# Patient Record
Sex: Male | Born: 1950 | Race: Black or African American | Hispanic: No | Marital: Married | Smoking: Current every day smoker
Health system: Southern US, Community
[De-identification: ages and names within clinical notes are randomized; demographics above are authoritative.]

## PROBLEM LIST (undated history)

## (undated) DIAGNOSIS — I5042 Chronic combined systolic (congestive) and diastolic (congestive) heart failure: Secondary | ICD-10-CM

## (undated) DIAGNOSIS — N183 Chronic kidney disease, stage 3 unspecified: Secondary | ICD-10-CM

## (undated) DIAGNOSIS — I447 Left bundle-branch block, unspecified: Secondary | ICD-10-CM

## (undated) DIAGNOSIS — I255 Ischemic cardiomyopathy: Secondary | ICD-10-CM

## (undated) DIAGNOSIS — I2510 Atherosclerotic heart disease of native coronary artery without angina pectoris: Secondary | ICD-10-CM

## (undated) DIAGNOSIS — E119 Type 2 diabetes mellitus without complications: Secondary | ICD-10-CM

## (undated) DIAGNOSIS — I10 Essential (primary) hypertension: Secondary | ICD-10-CM

## (undated) DIAGNOSIS — K5792 Diverticulitis of intestine, part unspecified, without perforation or abscess without bleeding: Secondary | ICD-10-CM

## (undated) DIAGNOSIS — Z720 Tobacco use: Secondary | ICD-10-CM

## (undated) DIAGNOSIS — I739 Peripheral vascular disease, unspecified: Secondary | ICD-10-CM

## (undated) DIAGNOSIS — J449 Chronic obstructive pulmonary disease, unspecified: Secondary | ICD-10-CM

## (undated) HISTORY — PX: NECK SURGERY: SHX720

---

## 2003-05-31 ENCOUNTER — Other Ambulatory Visit: Payer: Self-pay

## 2005-04-27 ENCOUNTER — Other Ambulatory Visit: Payer: Self-pay

## 2005-04-27 ENCOUNTER — Ambulatory Visit: Payer: Self-pay | Admitting: Unknown Physician Specialty

## 2005-05-02 ENCOUNTER — Emergency Department: Payer: Self-pay | Admitting: Emergency Medicine

## 2005-12-12 ENCOUNTER — Emergency Department: Payer: Self-pay | Admitting: Emergency Medicine

## 2006-05-30 ENCOUNTER — Emergency Department: Payer: Self-pay

## 2006-05-30 ENCOUNTER — Ambulatory Visit: Payer: Self-pay | Admitting: Psychiatry

## 2006-05-30 ENCOUNTER — Inpatient Hospital Stay (HOSPITAL_COMMUNITY): Admission: AD | Admit: 2006-05-30 | Discharge: 2006-06-06 | Payer: Self-pay | Admitting: Psychiatry

## 2008-04-12 ENCOUNTER — Inpatient Hospital Stay: Payer: Self-pay | Admitting: *Deleted

## 2008-08-10 ENCOUNTER — Ambulatory Visit: Payer: Self-pay | Admitting: Gastroenterology

## 2011-01-16 ENCOUNTER — Emergency Department: Payer: Self-pay | Admitting: Unknown Physician Specialty

## 2012-09-03 ENCOUNTER — Emergency Department: Payer: Self-pay | Admitting: Emergency Medicine

## 2012-09-03 LAB — COMPREHENSIVE METABOLIC PANEL
Albumin: 4 g/dL (ref 3.4–5.0)
Anion Gap: 10 (ref 7–16)
BUN: 22 mg/dL — ABNORMAL HIGH (ref 7–18)
Calcium, Total: 9.5 mg/dL (ref 8.5–10.1)
Co2: 20 mmol/L — ABNORMAL LOW (ref 21–32)
EGFR (African American): 49 — ABNORMAL LOW
EGFR (Non-African Amer.): 42 — ABNORMAL LOW
Glucose: 680 mg/dL (ref 65–99)
Potassium: 4.4 mmol/L (ref 3.5–5.1)
SGOT(AST): 15 U/L (ref 15–37)
SGPT (ALT): 29 U/L (ref 12–78)
Sodium: 129 mmol/L — ABNORMAL LOW (ref 136–145)
Total Protein: 7.8 g/dL (ref 6.4–8.2)

## 2012-09-03 LAB — URINALYSIS, COMPLETE
Glucose,UR: >=500 mg/dL (ref 0–75)
Ketone: NEGATIVE
Leukocyte Esterase: NEGATIVE
Nitrite: NEGATIVE
Protein: NEGATIVE
RBC,UR: <1 /HPF (ref 0–5)
Specific Gravity: 1.03 (ref 1.003–1.030)
WBC UR: <1 /HPF (ref 0–5)

## 2012-09-03 LAB — CBC
HGB: 14.1 g/dL (ref 13.0–18.0)
MCH: 32.5 pg (ref 26.0–34.0)
MCV: 96 fL (ref 80–100)

## 2012-09-03 LAB — CK TOTAL AND CKMB (NOT AT ARMC): CK, Total: 166 U/L (ref 35–232)

## 2012-09-03 LAB — TROPONIN I: Troponin-I: <0.02 ng/mL

## 2014-06-07 ENCOUNTER — Inpatient Hospital Stay: Payer: Self-pay | Admitting: Internal Medicine

## 2014-07-02 ENCOUNTER — Ambulatory Visit: Payer: Self-pay | Admitting: Family Medicine

## 2014-07-02 ENCOUNTER — Telehealth: Payer: Self-pay

## 2014-07-02 NOTE — Telephone Encounter (Signed)
Patient thinks he has an appt. Here.  No appt. Scheduled will call pcp to investigate.

## 2014-07-26 ENCOUNTER — Ambulatory Visit
Admit: 2014-07-26 | Disposition: A | Discharge status: Home / Self Care | Payer: Self-pay | Attending: Gastroenterology | Admitting: Gastroenterology

## 2014-08-08 NOTE — Consult Note (Signed)
Brief Consult Note: Diagnosis: uncontrolled htn, dm2.   Patient was seen by consultant.   Consult note dictated.   Orders entered.   Comments: 64yaam pmh dm2 insulin dep, p/w abd pain - contained perf diverticulitis  1. htn uncontrolled: infection, pain, regardless - prn hydralazine if remains elevated will need chronic meds, norvasc 5mg  2. dm2, insuilln requiring : given NPO status will decrease insulin to half doses, q6h accu, iss 3. hld: statin when tolerate po 4. complicated diverticuliits: cipro/flagyl, npo, defer further care to primary 5. vte px: hep subcutaneous full 1845min].  Electronic Signatures: Hower, Cletis Athensavid K (MD)  (Signed 29-Feb-16 02:20)  Authored: Brief Consult Note   Last Updated: 29-Feb-16 02:20 by Wyatt HasteHower, David K (MD)

## 2014-08-08 NOTE — Consult Note (Signed)
PATIENT NAME:  Damon [name], [name] MR#:  [id number] DATE OF BIRTH:  10-11-1950  DATE OF CONSULTATION:  06/07/2014  CONSULTING PHYSICIAN:  Cristal Deerhristopher A. Kamarion Zagami, MD  REASON FOR CONSULTATION: Suprapubic and rectal pain, leukocytosis, CT scan suggestive of acute diverticulitis with small peridiverticular abscess.   HISTORY OF PRESENT ILLNESS: Mr. Damon [name] is a pleasant 64 year old male with history of diabetes and hypercholesterolemia, who presents with 2 days of suprapubic pain that has worsened, also some rectal and anal pain. He had a similar episode approximately 1 week ago, which resolved on its own. No fevers, chills. No nausea, vomiting. No chest pain, shortness of breath. He has had mild diarrhea. No constipation. No headache, cough, dysuria, or hematuria.   PAST MEDICAL HISTORY:  1.  Diabetes mellitus.  2.  Hypercholesterolemia.  3.  History of back pain.  4.  History of neck surgery.   HOME MEDICATIONS:  1.  Novolin 70/30  b.i.d.  2.  Multivitamin.  3.  Aspirin 81 mg.   ALLERGIES: LIPITOR, ZOCOR, AND PRAVACHOL.   SOCIAL HISTORY: Everyday smoker. Denies significant alcohol. Denies drug use.   FAMILY HISTORY: Heart disease, cancer, hypertension.   REVIEW OF SYSTEMS: A 12-point review of systems was obtained. Pertinent positives and negatives as above.   PHYSICAL EXAMINATION:  VITAL SIGNS: Temperature 98.5, pulse 92, blood pressure 189/91, respirations 20, O2 saturation 99% on room air.  GENERAL: No acute distress. Alert and oriented x 3.  HEENT: Head: Normocephalic, atraumatic. Eyes: No scleral icterus. No conjunctivitis. Face: No obvious face trauma. Normal external nose. Normal external ears.  CHEST: Lungs clear to auscultation. Moving air well.  HEART: Regular rate and rhythm. No murmurs, rubs, or gallops.  ABDOMEN: Soft. Tender in the suprapubic region, nondistended.  EXTREMITIES: Moves all extremities well. Strength 5/5.  NEUROLOGIC: Cranial nerves II through XII grossly  intact.   LABORATORY DATA: Significant for white cell count of 14.1, hemoglobin 14.2, hematocrit 42.2, platelets are 255,000 with 79% neutrophil. Negative leukocyte esterase and negative nitrite on urinalysis. BMP is remarkable only for a blood sugar of 316.   CT scan shows diverticulitis with a small-contained perforation with small areas of gas outside. No pneumoperitoneum. Does have some submucosal edema in rectum thought to be reactive proctitis.   ASSESSMENT AND PLAN: Mr. Damon [name] is a pleasant 64 year old male who presents with acute onset of suprapubic and rectal pain and anal pain, mild leukocytosis plus hyperglycemia. CT scan suggestive of a contained perforated sigmoid diverticulitis. We will admit the patient, n.p.o., intravenous fluids, intravenous antibiotics, serial abdominal examinations. No urgent surgical indication at this time. The patient will need a colonoscopy in 6 weeks after he recovers from symptoms to exclude neoplasm. Discussed this with the patient and his wife and they agree with this plan.    ____________________________ Si Raiderhristopher A. Kenijah Benningfield, MD cal:ts D: 06/07/2014 01:21:00 ET T: 06/07/2014 01:39:39 ET JOB#: [id number]  cc: Cristal Deerhristopher A. Dejane Scheibe, MD, <Dictator> Jarvis NewcomerHRISTOPHER A Montoya Brandel MD ELECTRONICALLY SIGNED 06/15/2014 11:26

## 2014-08-08 NOTE — Consult Note (Signed)
PATIENT NAME:  Damon [name], Damon [name] MR#:  [id number] DATE OF BIRTH:  03/01/1951  DATE OF CONSULTATION:  06/07/2014  REFERRING PHYSICIAN:  Cristal Deer A. Lundquist, MD CONSULTING PHYSICIAN:  Cletis Athens. Travoris Bushey, MD   REASON FOR CONSULTATION: Uncontrolled hypertension.   HISTORY OF PRESENT ILLNESS: This is a 64 year old African American gentleman with past medical history of type 2 diabetes, insulin-dependent, as well as hyperlipidemia, unspecified; who was admitted to the hospital on 06/07/2014 by general surgery. He originally presented with acute onset of abdominal pain, sharp, suprapubic in location, nonradiating, no worsening or relieving factors intensity 8 to 9 out of 10. Currently after some doses of pain medication, he has relief. However during his hospital course thus far, found to have diverticulitis complicated by contained perforation. Subsequently, he was admitted by general surgery for the above issue. However, he is noted to be hypertensive with blood pressures at maximum of 197/84. This, we are asked for medical consult.  Currently the patient has no further complaints at this time and states his abdominal pain has improved. Intensity now is 2 to 3 out of 10.  REVIEW OF SYSTEMS:  CONSTITUTIONAL: Denies fevers, chills. Positive for fatigue, weakness.  EYES: Denies blurry vision, double vision, eye pain.  EARS, NOSE, THROAT: Denies tinnitus, ear pain, hearing loss.  RESPIRATORY: Denies cough, wheeze, or shortness of breath.  CARDIOVASCULAR: Denies chest pain, palpitations, edema.  GASTROINTESTINAL: Denies nausea, vomiting, and diarrhea. Positive for abdominal pain. GENITOURINARY: Denies dysuria or hematuria.  ENDOCRINE: Denies nocturia or thyroid problems. HEMATOLOGIC AND LYMPHATIC: Denies easy bruising or bleeding.  SKIN: Denies rashes or lesions.  MUSCULOSKELETAL: Denies pain in the neck, shoulders, knees, hips.  NEUROLOGIC: Denies paralysis, paresthesias.  PSYCHIATRIC: Denies anxiety or  depressive symptoms. Otherwise, a full review of systems performed by me is negative.   PAST MEDICAL HISTORY: Includes hyperlipidemia, unspecified; type 2 diabetes insulin-requiring, without complication.   SOCIAL HISTORY: Positive for everyday tobacco use. Occasional alcohol use. Denies any drug use.   FAMILY HISTORY: Positive for diabetes.   ALLERGIES: LIPITOR, PRAVACHOL, ZOCOR.   HOME MEDICATIONS: Include aspirin 81 mg p.o. daily, Novolin 70/30 fifteen units in the morning and 10 units in the evening, and multivitamin daily.   PHYSICAL EXAMINATION:  VITAL SIGNS: Temperature 98.5, heart rate 91, respirations 18. Blood pressure maximum 197/84, currently 131/84. Saturating 99% on room air. Weight 81.6 kg, BMI 25.8.  GENERAL: Well-developed, well-nourished African American gentleman, currently in no acute distress.  HEAD: Normocephalic, atraumatic.  EYES: Pupils equal, round, reactive to light. Extraocular muscles intact. No scleral icterus.  MOUTH: Moist mucosal membrane. Dentition poor. No abscess noted.  EARS, NOSE, AND THROAT:  Clear without exudates. No external lesions.  NECK: Supple. No thyromegaly. No nodules. No JVD.  PULMONARY: Clear to auscultation bilaterally without wheezes, rubs, or rhonchi. No use of accessory muscles. Good respiratory effort.  CHEST: Nontender to palpation.  CARDIOVASCULAR: S1, S2, regular rate and rhythm. No murmurs, rubs, or gallops. No edema. Pedal pulses 2+ bilaterally.  GASTROINTESTINAL:  Soft. Minimal tenderness in the suprapubic region without rebound or guarding. No motion tenderness. Positive bowel sounds. No hepatosplenomegaly.  MUSCULOSKELETAL: No swelling, clubbing, or edema. Range of motion full in all extremities.  NEUROLOGIC: Cranial nerves II through XII intact. No gross focal neurologic deficits. Sensation intact. Reflexes intact.  SKIN: No ulcerations, lesions, rash, cyanosis. Skin warm, dry. Turgor intact.  PSYCHIATRIC: Mood and affect  within normal limits. The patient is awake, alert, and oriented x3. Insight and judgment intact.  LABORATORY DATA: Sodium 139, potassium 3.9, chloride 107, bicarbonate 22, BUN 12, creatinine 1.16, glucose 316. LFTs: Albumin 3.2, alkaline phosphatase 117, AST 10, ALT 13 troponin less than 0.02. WBC 14.1, hemoglobin 14.2, platelets of 255,000. Urinalysis negative for evidence of infection.   CT of the abdomen and pelvis performed which reveals sigmoid diverticulitis complicated by contained perforation.   ASSESSMENT AND PLAN: A 64 year old African American gentleman with history of type 2 diabetes insulin-dependent, presented with abdominal pain, found to have diverticulitis complicated by perforation.  1. Hypertension, uncontrolled, likely in relation to infection as well as pain. Regardless, we will add p.r.n. hydralazine 10 mg IV q.4h. as needed for systolic greater than 180 or diastolic greater than 100. However, if it remains elevated will need chronic medications. When he is able to tolerate p.o. we will initiate something like Norvasc 5 mg p.o. daily. Once again, n.p.o. now. We will continue with current medications.  2. Type 2 diabetes insulin-requiring. Given his n.p.o. status, will decrease the dose to half dosing, which will be 7 units in the morning and 5 units in the evening of his 70/30. We will also initiate q.6 h. Accu-Cheks with insulin sliding scale coverage.  3. Complicated diverticulitis. Cipro, Flagyl for antibiotic coverage, n.p.o. status.  4. We will defer further care to primary team and general surgery.  5. DVT prophylaxis with heparin subcutaneous.  6. The patient is full code.   TIME SPENT: 45 minutes.    ____________________________ Cletis Athensavid K. Cyan Clippinger, MD dkh:ah D: 06/07/2014 02:28:00 ET T: 06/07/2014 06:44:17 ET JOB#: [id number]  cc: Cletis Athensavid K. Keshonna Valvo, MD, <Dictator> Latayvia Mandujano Synetta ShadowK Mafalda Mcginniss MD ELECTRONICALLY SIGNED 06/07/2014 21:05

## 2014-08-08 NOTE — Discharge Summary (Signed)
PATIENT NAME:  Damon [name], Damon [name] MR#:  [id number] DATE OF BIRTH:  04-30-1950  DATE OF ADMISSION:  06/07/2014 DATE OF DISCHARGE:  06/09/2014  DIAGNOSES: Diverticulitis, diabetes, and hypercholesterolemia.   PROCEDURES: None.   CONSULTANTS: None.   HISTORY OF PRESENT ILLNESS AND HOSPITAL COURSE: This patient was admitted to the hospital by Dr. Juliann PulseLundquist with a history of increasing left lower quadrant abdominal pain and a CT scan suggestive of acute diverticulitis. He was promptly placed on IV antibiotics and improved drastically and, in fact, his pain is virtually completely resolved at this point. He is tolerating a diet and is discharged in stable condition to continue oral antibiotics in the form of Cipro and Flagyl. He is restarted on his home medications and will follow up in our office in 10 days.    ____________________________ Adah Salvageichard E. Excell Seltzerooper, MD rec:mc D: 06/09/2014 11:04:23 ET T: 06/09/2014 14:26:14 ET JOB#: [id number]  cc: Adah Salvageichard E. Excell Seltzerooper, MD, <Dictator> Lattie HawICHARD E Kerrilynn Derenzo MD ELECTRONICALLY SIGNED 06/09/2014 14:51

## 2014-11-16 ENCOUNTER — Emergency Department
Admission: EM | Admit: 2014-11-16 | Discharge: 2014-11-16 | Disposition: A | Discharge status: Home / Self Care | Payer: Commercial Managed Care - PPO | Attending: Emergency Medicine | Admitting: Emergency Medicine

## 2014-11-16 ENCOUNTER — Encounter: Payer: Self-pay | Admitting: Emergency Medicine

## 2014-11-16 DIAGNOSIS — R739 Hyperglycemia, unspecified: Secondary | ICD-10-CM

## 2014-11-16 DIAGNOSIS — E1165 Type 2 diabetes mellitus with hyperglycemia: Secondary | ICD-10-CM | POA: Insufficient documentation

## 2014-11-16 DIAGNOSIS — Z720 Tobacco use: Secondary | ICD-10-CM | POA: Diagnosis not present

## 2014-11-16 DIAGNOSIS — I10 Essential (primary) hypertension: Secondary | ICD-10-CM | POA: Insufficient documentation

## 2014-11-16 DIAGNOSIS — R112 Nausea with vomiting, unspecified: Secondary | ICD-10-CM | POA: Diagnosis present

## 2014-11-16 HISTORY — DX: Diverticulitis of intestine, part unspecified, without perforation or abscess without bleeding: K57.92

## 2014-11-16 HISTORY — DX: Type 2 diabetes mellitus without complications: E11.9

## 2014-11-16 LAB — URINALYSIS COMPLETE WITH MICROSCOPIC (ARMC ONLY)
Bacteria, UA: NONE SEEN
Bilirubin Urine: NEGATIVE
Glucose, UA: >500 mg/dL — AB
Hgb urine dipstick: NEGATIVE
LEUKOCYTES UA: NEGATIVE
NITRITE: NEGATIVE
Protein, ur: NEGATIVE mg/dL
Specific Gravity, Urine: 1.026 (ref 1.005–1.030)
pH: 5 (ref 5.0–8.0)

## 2014-11-16 LAB — BASIC METABOLIC PANEL
Anion gap: 15 (ref 5–15)
BUN: 29 mg/dL — AB (ref 6–20)
CO2: 21 mmol/L — ABNORMAL LOW (ref 22–32)
Calcium: 10.3 mg/dL (ref 8.9–10.3)
Chloride: 101 mmol/L (ref 101–111)
Creatinine, Ser: 1.95 mg/dL — ABNORMAL HIGH (ref 0.61–1.24)
GFR, EST AFRICAN AMERICAN: 41 mL/min — AB (ref >60)
GFR, EST NON AFRICAN AMERICAN: 35 mL/min — AB (ref >60)
Glucose, Bld: 545 mg/dL (ref 65–99)
Potassium: 4.4 mmol/L (ref 3.5–5.1)
Sodium: 137 mmol/L (ref 135–145)

## 2014-11-16 LAB — CBC WITH DIFFERENTIAL/PLATELET
BASOS ABS: 0.1 10*3/uL (ref 0–0.1)
BASOS PCT: 1 %
Eosinophils Absolute: 0.1 10*3/uL (ref 0–0.7)
Eosinophils Relative: 1 %
HCT: 50.1 % (ref 40.0–52.0)
Hemoglobin: 16.5 g/dL (ref 13.0–18.0)
LYMPHS ABS: 2.8 10*3/uL (ref 1.0–3.6)
LYMPHS PCT: 19 %
MCH: 31 pg (ref 26.0–34.0)
MCHC: 32.9 g/dL (ref 32.0–36.0)
MCV: 94.3 fL (ref 80.0–100.0)
MONOS PCT: 6 %
Monocytes Absolute: 0.9 10*3/uL (ref 0.2–1.0)
Neutro Abs: 10.6 10*3/uL — ABNORMAL HIGH (ref 1.4–6.5)
Neutrophils Relative %: 73 %
PLATELETS: 291 10*3/uL (ref 150–440)
RBC: 5.31 MIL/uL (ref 4.40–5.90)
RDW: 13.8 % (ref 11.5–14.5)
WBC: 14.5 10*3/uL — ABNORMAL HIGH (ref 3.8–10.6)

## 2014-11-16 LAB — GLUCOSE, CAPILLARY
GLUCOSE-CAPILLARY: 505 mg/dL — AB (ref 65–99)
GLUCOSE-CAPILLARY: 433 mg/dL — AB (ref 65–99)
GLUCOSE-CAPILLARY: 546 mg/dL — AB (ref 65–99)
Glucose-Capillary: 298 mg/dL — ABNORMAL HIGH (ref 65–99)

## 2014-11-16 LAB — BLOOD GAS, VENOUS
Acid-base deficit: 3.1 mmol/L — ABNORMAL HIGH (ref 0.0–2.0)
Bicarbonate: 22 mEq/L (ref 21.0–28.0)
FIO2: 0.21
PATIENT TEMPERATURE: 37
pCO2, Ven: 39 mmHg — ABNORMAL LOW (ref 44.0–60.0)
pH, Ven: 7.36 (ref 7.320–7.430)

## 2014-11-16 MED ORDER — SODIUM CHLORIDE 0.9 % IV BOLUS (SEPSIS)
1000.0000 mL | Freq: Once | INTRAVENOUS | Status: AC
  Administered 2014-11-16: 1000 mL via INTRAVENOUS

## 2014-11-16 MED ORDER — INSULIN ASPART 100 UNIT/ML ~~LOC~~ SOLN
6.0000 [IU] | Freq: Once | SUBCUTANEOUS | Status: AC
  Administered 2014-11-16: 6 [IU] via INTRAVENOUS
  Filled 2014-11-16: qty 6

## 2014-11-16 NOTE — ED Notes (Signed)
Given water; okay per EDP.  Aware of need for urine.

## 2014-11-16 NOTE — ED Notes (Signed)
MD at bedside. 

## 2014-11-16 NOTE — ED Provider Notes (Signed)
[facility] Emergency Department Provider Note  ____________________________________________  Time seen: 0730  I have reviewed the triage vital signs and the nursing notes.   HISTORY  Chief Complaint Nausea and Emesis   History limited by: Not Limited   HPI Damon [name] is a 64 y.o. male who presents to the emergency department because of vomiting and nausea that started 3 days ago. Patient states that he has not been able to hold down any solid by mouth since then. His nausea and vomiting is accompanied by some abdominal discomfort. He does state that his insulin regimen was changed within the last week. He intermittently checks his blood sugars at home. He denies any fevers, chest pain, shortness of breath. Denies any diarrhea.   Past Medical History  Diagnosis Date  . Diabetes mellitus without complication   . Hypertension     There are no active problems to display for this patient.   History reviewed. No pertinent past surgical history.  No current outpatient prescriptions on file.  Allergies Review of patient's allergies indicates no known allergies.  No family history on file.  Social History History  Substance Use Topics  . Smoking status: Current Every Day Smoker  . Smokeless tobacco: Not on file  . Alcohol Use: No    Review of Systems  Constitutional: Negative for fever. Cardiovascular: Negative for chest pain. Respiratory: Negative for shortness of breath. Gastrointestinal: Positive for nausea and vomiting Genitourinary: Negative for dysuria. Musculoskeletal: Negative for back pain. Skin: Negative for rash. Neurological: Negative for headaches, focal weakness or numbness.  10-point ROS otherwise negative.  ____________________________________________   PHYSICAL EXAM:  VITAL SIGNS: ED Triage Vitals  Enc Vitals Group     BP 11/16/14 0719 135/80 mmHg     Pulse Rate 11/16/14 0719 122     Resp 11/16/14 0719 20      Temp 11/16/14 0719 98.5 F (36.9 C)     Temp Source 11/16/14 0719 Oral     SpO2 11/16/14 0719 99 %     Weight 11/16/14 0719 170 lb (77.111 kg)     Height 11/16/14 0719 5\' 10"  (1.778 m)     Head Cir --      Peak Flow --      Pain Score 11/16/14 0720 0   Constitutional: Alert and oriented. Well appearing and in no distress. Eyes: Conjunctivae are normal. PERRL. Normal extraocular movements. ENT   Head: Normocephalic and atraumatic.   Nose: No congestion/rhinnorhea.   Mouth/Throat: Mucous membranes are moist.   Neck: No stridor. Hematological/Lymphatic/Immunilogical: No cervical lymphadenopathy. Cardiovascular: Normal rate, regular rhythm.  No murmurs, rubs, or gallops. Respiratory: Normal respiratory effort without tachypnea nor retractions. Breath sounds are clear and equal bilaterally. No wheezes/rales/rhonchi. Gastrointestinal: Soft and nontender. No distention. There is no CVA tenderness. Genitourinary: Deferred Musculoskeletal: Normal range of motion in all extremities. No joint effusions.  No lower extremity tenderness nor edema. Neurologic:  Normal speech and language. No gross focal neurologic deficits are appreciated. Speech is normal.  Skin:  Skin is warm, dry and intact. No rash noted. Psychiatric: Mood and affect are normal. Speech and behavior are normal. Patient exhibits appropriate insight and judgment.  ____________________________________________    LABS (pertinent positives/negatives)  Labs Reviewed  GLUCOSE, CAPILLARY - Abnormal; Notable for the following:    Glucose-Capillary 505 (*)    All other components within normal limits  URINALYSIS COMPLETEWITH MICROSCOPIC ([facility] ONLY) - Abnormal; Notable for the following:    Color, Urine YELLOW (*)  APPearance CLEAR (*)    Glucose, UA >500 (*)    Ketones, ur 1+ (*)    Squamous Epithelial / LPF 0-5 (*)    All other components within normal limits  CBC WITH DIFFERENTIAL/PLATELET - Abnormal; Notable  for the following:    WBC 14.5 (*)    Neutro Abs 10.6 (*)    All other components within normal limits  BLOOD GAS, VENOUS - Abnormal; Notable for the following:    pCO2, Ven 39 (*)    Acid-base deficit 3.1 (*)    All other components within normal limits  BASIC METABOLIC PANEL - Abnormal; Notable for the following:    CO2 21 (*)    Glucose, Bld 545 (*)    BUN 29 (*)    Creatinine, Ser 1.95 (*)    GFR calc non Af Amer 35 (*)    GFR calc Af Amer 41 (*)    All other components within normal limits  GLUCOSE, CAPILLARY - Abnormal; Notable for the following:    Glucose-Capillary 546 (*)    All other components within normal limits  GLUCOSE, CAPILLARY - Abnormal; Notable for the following:    Glucose-Capillary 433 (*)    All other components within normal limits  GLUCOSE, CAPILLARY - Abnormal; Notable for the following:    Glucose-Capillary 298 (*)    All other components within normal limits      ____________________________________________   EKG  None  ____________________________________________    RADIOLOGY  None  ____________________________________________   PROCEDURES  Procedure(s) performed: None  Critical Care performed: No  ____________________________________________   INITIAL IMPRESSION / ASSESSMENT AND PLAN / ED COURSE  Pertinent labs & imaging results that were available during my care of the patient were reviewed by me and considered in my medical decision making (see chart for details).  Patient presented to the emergency department today because of concerns for nausea and vomiting. Patient's sugars were found be quite elevated here. No signs of DKA on the other blood work. Patient's sugars did come down appropriately after fluids and insulin. Discussed with patient's importance of following with his primary care doctor for further diabetic control.  ____________________________________________   FINAL CLINICAL IMPRESSION(S) / ED  DIAGNOSES  Final diagnoses:  Hyperglycemia     Phineas Semen, MD 11/16/14 1251

## 2014-11-16 NOTE — ED Notes (Addendum)
Pt to ed with c/o vomiting and nausea since Saturday.  Pt states last week his PMD changed his insulin to lantus and since he has had nausea and vomiting.

## 2014-11-16 NOTE — ED Notes (Signed)
Patient has had vomiting since Saturday.  Reports they recently took him off 70/30 and placed him on lantus daily because his A1C was too high.  Only checks sugars once in a while per his report.

## 2014-11-16 NOTE — Discharge Instructions (Signed)
It is very important that you discuss your diabetes control with her primary care physician.Please seek medical attention for any high fevers, chest pain, shortness of breath, change in behavior, persistent vomiting, bloody stool or any other new or concerning symptoms.  Hyperglycemia Hyperglycemia occurs when the glucose (sugar) in your blood is too high. Hyperglycemia can happen for many reasons, but it most often happens to people who do not know they have diabetes or are not managing their diabetes properly.  CAUSES  Whether you have diabetes or not, there are other causes of hyperglycemia. Hyperglycemia can occur when you have diabetes, but it can also occur in other situations that you might not be as aware of, such as: Diabetes  If you have diabetes and are having problems controlling your blood glucose, hyperglycemia could occur because of some of the following reasons:  Not following your meal plan.  Not taking your diabetes medications or not taking it properly.  Exercising less or doing less activity than you normally do.  Being sick. Pre-diabetes  This cannot be ignored. Before people develop Type 2 diabetes, they almost always have "pre-diabetes." This is when your blood glucose levels are higher than normal, but not yet high enough to be diagnosed as diabetes. Research has shown that some long-term damage to the body, especially the heart and circulatory system, may already be occurring during pre-diabetes. If you take action to manage your blood glucose when you have pre-diabetes, you may delay or prevent Type 2 diabetes from developing. Stress  If you have diabetes, you may be "diet" controlled or on oral medications or insulin to control your diabetes. However, you may find that your blood glucose is higher than usual in the hospital whether you have diabetes or not. This is often referred to as "stress hyperglycemia." Stress can elevate your blood glucose. This happens because  of hormones put out by the body during times of stress. If stress has been the cause of your high blood glucose, it can be followed regularly by your caregiver. That way he/she can make sure your hyperglycemia does not continue to get worse or progress to diabetes. Steroids  Steroids are medications that act on the infection fighting system (immune system) to block inflammation or infection. One side effect can be a rise in blood glucose. Most people can produce enough extra insulin to allow for this rise, but for those who cannot, steroids make blood glucose levels go even higher. It is not unusual for steroid treatments to "uncover" diabetes that is developing. It is not always possible to determine if the hyperglycemia will go away after the steroids are stopped. A special blood test called an A1c is sometimes done to determine if your blood glucose was elevated before the steroids were started. SYMPTOMS  Thirsty.  Frequent urination.  Dry mouth.  Blurred vision.  Tired or fatigue.  Weakness.  Sleepy.  Tingling in feet or leg. DIAGNOSIS  Diagnosis is made by monitoring blood glucose in one or all of the following ways:  A1c test. This is a chemical found in your blood.  Fingerstick blood glucose monitoring.  Laboratory results. TREATMENT  First, knowing the cause of the hyperglycemia is important before the hyperglycemia can be treated. Treatment may include, but is not be limited to:  Education.  Change or adjustment in medications.  Change or adjustment in meal plan.  Treatment for an illness, infection, etc.  More frequent blood glucose monitoring.  Change in exercise plan.  Decreasing or  stopping steroids.  Lifestyle changes. HOME CARE INSTRUCTIONS   Test your blood glucose as directed.  Exercise regularly. Your caregiver will give you instructions about exercise. Pre-diabetes or diabetes which comes on with stress is helped by exercising.  Eat wholesome,  balanced meals. Eat often and at regular, fixed times. Your caregiver or nutritionist will give you a meal plan to guide your sugar intake.  Being at an ideal weight is important. If needed, losing as little as 10 to 15 pounds may help improve blood glucose levels. SEEK MEDICAL CARE IF:   You have questions about medicine, activity, or diet.  You continue to have symptoms (problems such as increased thirst, urination, or weight gain). SEEK IMMEDIATE MEDICAL CARE IF:   You are vomiting or have diarrhea.  Your breath smells fruity.  You are breathing faster or slower.  You are very sleepy or incoherent.  You have numbness, tingling, or pain in your feet or hands.  You have chest pain.  Your symptoms get worse even though you have been following your caregiver's orders.  If you have any other questions or concerns. Document Released: 09/19/2000 Document Revised: 06/18/2011 Document Reviewed: 07/23/2011 [facility] Patient Information 2015 [place], [place]. This information is not intended to replace advice given to you by your health care provider. Make sure you discuss any questions you have with your health care provider.

## 2015-03-24 ENCOUNTER — Emergency Department
Admission: EM | Admit: 2015-03-24 | Discharge: 2015-03-24 | Disposition: A | Discharge status: Home / Self Care | Payer: Commercial Managed Care - PPO | Attending: Emergency Medicine | Admitting: Emergency Medicine

## 2015-03-24 ENCOUNTER — Emergency Department: Payer: Commercial Managed Care - PPO

## 2015-03-24 DIAGNOSIS — Z79899 Other long term (current) drug therapy: Secondary | ICD-10-CM | POA: Diagnosis not present

## 2015-03-24 DIAGNOSIS — J069 Acute upper respiratory infection, unspecified: Secondary | ICD-10-CM | POA: Diagnosis not present

## 2015-03-24 DIAGNOSIS — E119 Type 2 diabetes mellitus without complications: Secondary | ICD-10-CM | POA: Diagnosis not present

## 2015-03-24 DIAGNOSIS — F17200 Nicotine dependence, unspecified, uncomplicated: Secondary | ICD-10-CM | POA: Insufficient documentation

## 2015-03-24 DIAGNOSIS — Z794 Long term (current) use of insulin: Secondary | ICD-10-CM | POA: Insufficient documentation

## 2015-03-24 DIAGNOSIS — R0602 Shortness of breath: Secondary | ICD-10-CM | POA: Diagnosis present

## 2015-03-24 DIAGNOSIS — R030 Elevated blood-pressure reading, without diagnosis of hypertension: Secondary | ICD-10-CM | POA: Diagnosis not present

## 2015-03-24 DIAGNOSIS — I10 Essential (primary) hypertension: Secondary | ICD-10-CM | POA: Diagnosis not present

## 2015-03-24 MED ORDER — HYDROCOD POLST-CPM POLST ER 10-8 MG/5ML PO SUER
5.0000 mL | Freq: Once | ORAL | Status: AC
  Administered 2015-03-24: 5 mL via ORAL
  Filled 2015-03-24: qty 5

## 2015-03-24 MED ORDER — PSEUDOEPH-BROMPHEN-DM 30-2-10 MG/5ML PO SYRP: 5.0000 mL | ORAL_SOLUTION | Freq: Four times a day (QID) | ORAL | Status: DC | PRN

## 2015-03-24 NOTE — ED Notes (Signed)
Pt c/o chest congestion that is making him feel short of breath.

## 2015-03-24 NOTE — ED Notes (Signed)
Pt in with shob since Monday, does have a cough and congestion.  No shob noted at this time no co pain.

## 2015-03-24 NOTE — Discharge Instructions (Signed)
Advised to consider tobacco cessation.  Upper Respiratory Infection, Adult Most upper respiratory infections (URIs) are a viral infection of the air passages leading to the lungs. A URI affects the nose, throat, and upper air passages. The most common type of URI is nasopharyngitis and is typically referred to as "the common cold." URIs run their course and usually go away on their own. Most of the time, a URI does not require medical attention, but sometimes a bacterial infection in the upper airways can follow a viral infection. This is called a secondary infection. Sinus and middle ear infections are common types of secondary upper respiratory infections. Bacterial pneumonia can also complicate a URI. A URI can worsen asthma and chronic obstructive pulmonary disease (COPD). Sometimes, these complications can require emergency medical care and may be life threatening.  CAUSES Almost all URIs are caused by viruses. A virus is a type of germ and can spread from one person to another.  RISKS FACTORS You may be at risk for a URI if:   You smoke.   You have chronic heart or lung disease.  You have a weakened defense (immune) system.   You are very young or very old.   You have nasal allergies or asthma.  You work in crowded or poorly ventilated areas.  You work in health care facilities or schools. SIGNS AND SYMPTOMS  Symptoms typically develop 2-3 days after you come in contact with a cold virus. Most viral URIs last 7-10 days. However, viral URIs from the influenza virus (flu virus) can last 14-18 days and are typically more severe. Symptoms may include:   Runny or stuffy (congested) nose.   Sneezing.   Cough.   Sore throat.   Headache.   Fatigue.   Fever.   Loss of appetite.   Pain in your forehead, behind your eyes, and over your cheekbones (sinus pain).  Muscle aches.  DIAGNOSIS  Your health care provider may diagnose a URI by:  Physical exam.  Tests to  check that your symptoms are not due to another condition such as:  Strep throat.  Sinusitis.  Pneumonia.  Asthma. TREATMENT  A URI goes away on its own with time. It cannot be cured with medicines, but medicines may be prescribed or recommended to relieve symptoms. Medicines may help:  Reduce your fever.  Reduce your cough.  Relieve nasal congestion. HOME CARE INSTRUCTIONS   Take medicines only as directed by your health care provider.   Gargle warm saltwater or take cough drops to comfort your throat as directed by your health care provider.  Use a warm mist humidifier or inhale steam from a shower to increase air moisture. This may make it easier to breathe.  Drink enough fluid to keep your urine clear or pale yellow.   Eat soups and other clear broths and maintain good nutrition.   Rest as needed.   Return to work when your temperature has returned to normal or as your health care provider advises. You may need to stay home longer to avoid infecting others. You can also use a face mask and careful hand washing to prevent spread of the virus.  Increase the usage of your inhaler if you have asthma.   Do not use any tobacco products, including cigarettes, chewing tobacco, or electronic cigarettes. If you need help quitting, ask your health care provider. PREVENTION  The best way to protect yourself from getting a cold is to practice good hygiene.   Avoid oral or  hand contact with people with cold symptoms.   Wash your hands often if contact occurs.  There is no clear evidence that vitamin C, vitamin E, echinacea, or exercise reduces the chance of developing a cold. However, it is always recommended to get plenty of rest, exercise, and practice good nutrition.  SEEK MEDICAL CARE IF:   You are getting worse rather than better.   Your symptoms are not controlled by medicine.   You have chills.  You have worsening shortness of breath.  You have brown or red  mucus.  You have yellow or brown nasal discharge.  You have pain in your face, especially when you bend forward.  You have a fever.  You have swollen neck glands.  You have pain while swallowing.  You have white areas in the back of your throat. SEEK IMMEDIATE MEDICAL CARE IF:   You have severe or persistent:  Headache.  Ear pain.  Sinus pain.  Chest pain.  You have chronic lung disease and any of the following:  Wheezing.  Prolonged cough.  Coughing up blood.  A change in your usual mucus.  You have a stiff neck.  You have changes in your:  Vision.  Hearing.  Thinking.  Mood. MAKE SURE YOU:   Understand these instructions.  Will watch your condition.  Will get help right away if you are not doing well or get worse.   This information is not intended to replace advice given to you by your health care provider. Make sure you discuss any questions you have with your health care provider.   Document Released: 09/19/2000 Document Revised: 08/10/2014 Document Reviewed: 07/01/2013 Elsevier Interactive Patient Education [employer]

## 2015-03-24 NOTE — ED Provider Notes (Signed)
[facility] Emergency Department Provider Note  ____________________________________________  Time seen: Approximately 10:36 PM  I have reviewed the triage vital signs and the nursing notes.   HISTORY  Chief Complaint Shortness of Breath    HPI Damon [name] is a 64 y.o. male patient complain of dyspnea with cough congestion for 1 week. Patient denies any fever or chills with this complaint. He denies any nausea vomiting diarrhea. He stated cough increase with laying down. Patient stated cough refractory to over-the-counter cough medications. Patient denies any chest pain. Patient has a positive tobacco use.    Past Medical History  Diagnosis Date  . Diabetes mellitus without complication   . Hypertension   . Diverticulitis     There are no active problems to display for this patient.   Past Surgical History  Procedure Laterality Date  . Neck surgery      Current Outpatient Rx  Name  Route  Sig  Dispense  Refill  . amLODipine (NORVASC) 10 MG tablet   Oral   Take 10 mg by mouth daily.         . enalapril (VASOTEC) 20 MG tablet   Oral   Take 40 mg by mouth daily.         Marland Kitchen. gabapentin (NEURONTIN) 300 MG capsule   Oral   Take 300 mg by mouth 2 (two) times daily.         . hydrochlorothiazide (HYDRODIURIL) 25 MG tablet   Oral   Take 25 mg by mouth daily.         . Insulin Glargine (LANTUS SOLOSTAR) 100 UNIT/ML Solostar Pen   Subcutaneous   Inject 20 Units into the skin daily.         . metoprolol (LOPRESSOR) 50 MG tablet   Oral   Take 50 mg by mouth 2 (two) times daily.           Allergies Review of patient's allergies indicates no known allergies.  No family history on file.  Social History Social History  Substance Use Topics  . Smoking status: Current Every Day Smoker  . Smokeless tobacco: Not on file  . Alcohol Use: No    Review of Systems Constitutional: No fever/chills Eyes: No visual changes. ENT: No  sore throat. Cardiovascular: Denies chest pain. Respiratory: Denies shortness of breath. Gastrointestinal: No abdominal pain.  No nausea, no vomiting.  No diarrhea.  No constipation. Genitourinary: Negative for dysuria. Musculoskeletal: Negative for back pain. Skin: Negative for rash. Neurological: Negative for headaches, focal weakness or numbness. Endocrine:Hypertension and diabetes. 10-point ROS otherwise negative.  ____________________________________________   PHYSICAL EXAM:  VITAL SIGNS: ED Triage Vitals  Enc Vitals Group     BP 03/24/15 2203 176/84 mmHg     Pulse Rate 03/24/15 2203 87     Resp 03/24/15 2203 20     Temp 03/24/15 2203 98.4 F (36.9 C)     Temp Source 03/24/15 2203 Oral     SpO2 03/24/15 2203 100 %     Weight 03/24/15 2203 175 lb (79.379 kg)     Height 03/24/15 2203 5\' 10"  (1.778 m)     Head Cir --      Peak Flow --      Pain Score --      Pain Loc --      Pain Edu? --      Excl. in GC? --     Constitutional: Alert and oriented. Well appearing and in no acute  distress. Eyes: Conjunctivae are normal. PERRL. EOMI. Head: Atraumatic. Nose: No congestion/rhinnorhea. Mouth/Throat: Mucous membranes are moist.  Oropharynx non-erythematous. Neck: No stridor.  No cervical spine tenderness to palpation. Hematological/Lymphatic/Immunilogical: No cervical lymphadenopathy. Cardiovascular: Normal rate, regular rhythm. Grossly normal heart sounds.  Good peripheral circulation. Elevated blood pressure Respiratory: Normal respiratory effort.  No retractions. Lungs bilateral Rales. Gastrointestinal: Soft and nontender. No distention. No abdominal bruits. No CVA tenderness. Musculoskeletal: No lower extremity tenderness nor edema.  No joint effusions. Neurologic:  Normal speech and language. No gross focal neurologic deficits are appreciated. No gait instability. Skin:  Skin is warm, dry and intact. No rash noted. Psychiatric: Mood and affect are normal. Speech and  behavior are normal.  ____________________________________________   LABS (all labs ordered are listed, but only abnormal results are displayed)  Labs Reviewed - No data to display ____________________________________________  EKG   ____________________________________________  RADIOLOGY  No acute findings on chest x-ray. I, Joni Reining, personally viewed and evaluated these images (plain radiographs) as part of my medical decision making.   ____________________________________________   PROCEDURES  Procedure(s) performed: None  Critical Care performed: No  ____________________________________________   INITIAL IMPRESSION / ASSESSMENT AND PLAN / ED COURSE  Pertinent labs & imaging results that were available during my care of the patient were reviewed by me and considered in my medical decision making (see chart for details).  Upper respiratory infection. Discussed negative history final patient. This patient given discharge Instructions. Patient given prescription for Bromfed-DM. Discussed smoking sensation. Patient given a prescription for Bromfed-DM to take as directed. Patient advised follow-up with family doctor. ____________________________________________   FINAL CLINICAL IMPRESSION(S) / ED DIAGNOSES  Final diagnoses:  URI (upper respiratory infection)      Joni Reining, PA-C 03/24/15 4259  Minna Antis, MD 03/24/15 2351

## 2015-03-24 NOTE — ED Notes (Signed)
Pt in NAD.  Discharge instruction given to pt and wife.  Voiced understanding.  Teach back verified.  No questions or concerns at this time.  Items with pt upon discharge.  No items left in ED.  Pt ambulatory, but requested wheelchair due to distance and shortness of breath.

## 2015-10-19 ENCOUNTER — Other Ambulatory Visit: Payer: Self-pay | Admitting: Vascular Surgery

## 2015-10-31 ENCOUNTER — Other Ambulatory Visit
Admission: RE | Admit: 2015-10-31 | Discharge: 2015-10-31 | Disposition: A | Discharge status: Home / Self Care | Payer: Commercial Managed Care - PPO | Source: Ambulatory Visit | Attending: Vascular Surgery | Admitting: Vascular Surgery

## 2015-10-31 DIAGNOSIS — Z01812 Encounter for preprocedural laboratory examination: Secondary | ICD-10-CM | POA: Diagnosis not present

## 2015-10-31 LAB — CREATININE, SERUM
CREATININE: 1.43 mg/dL — AB (ref 0.61–1.24)
GFR calc Af Amer: 59 mL/min — ABNORMAL LOW (ref >60)
GFR calc non Af Amer: 51 mL/min — ABNORMAL LOW (ref >60)

## 2015-10-31 LAB — BUN: BUN: 18 mg/dL (ref 6–20)

## 2015-11-01 ENCOUNTER — Encounter: Payer: Self-pay | Admitting: *Deleted

## 2015-11-01 ENCOUNTER — Ambulatory Visit
Admission: RE | Admit: 2015-11-01 | Discharge: 2015-11-01 | Disposition: A | Discharge status: Home / Self Care | Payer: Commercial Managed Care - PPO | Source: Ambulatory Visit | Attending: Vascular Surgery | Admitting: Vascular Surgery

## 2015-11-01 ENCOUNTER — Encounter
Admission: RE | Disposition: A | Discharge status: Home / Self Care | Payer: Self-pay | Source: Ambulatory Visit | Attending: Vascular Surgery

## 2015-11-01 DIAGNOSIS — M7989 Other specified soft tissue disorders: Secondary | ICD-10-CM | POA: Diagnosis not present

## 2015-11-01 DIAGNOSIS — E7800 Pure hypercholesterolemia, unspecified: Secondary | ICD-10-CM | POA: Diagnosis not present

## 2015-11-01 DIAGNOSIS — E119 Type 2 diabetes mellitus without complications: Secondary | ICD-10-CM | POA: Insufficient documentation

## 2015-11-01 DIAGNOSIS — I999 Unspecified disorder of circulatory system: Secondary | ICD-10-CM | POA: Diagnosis not present

## 2015-11-01 DIAGNOSIS — Z833 Family history of diabetes mellitus: Secondary | ICD-10-CM | POA: Diagnosis not present

## 2015-11-01 DIAGNOSIS — I70213 Atherosclerosis of native arteries of extremities with intermittent claudication, bilateral legs: Secondary | ICD-10-CM | POA: Diagnosis not present

## 2015-11-01 DIAGNOSIS — I10 Essential (primary) hypertension: Secondary | ICD-10-CM | POA: Diagnosis not present

## 2015-11-01 DIAGNOSIS — F17200 Nicotine dependence, unspecified, uncomplicated: Secondary | ICD-10-CM | POA: Diagnosis not present

## 2015-11-01 DIAGNOSIS — M79609 Pain in unspecified limb: Secondary | ICD-10-CM | POA: Insufficient documentation

## 2015-11-01 DIAGNOSIS — Z8249 Family history of ischemic heart disease and other diseases of the circulatory system: Secondary | ICD-10-CM | POA: Insufficient documentation

## 2015-11-01 DIAGNOSIS — Z841 Family history of disorders of kidney and ureter: Secondary | ICD-10-CM | POA: Insufficient documentation

## 2015-11-01 DIAGNOSIS — Z823 Family history of stroke: Secondary | ICD-10-CM | POA: Insufficient documentation

## 2015-11-01 DIAGNOSIS — M6281 Muscle weakness (generalized): Secondary | ICD-10-CM | POA: Diagnosis not present

## 2015-11-01 DIAGNOSIS — Z794 Long term (current) use of insulin: Secondary | ICD-10-CM | POA: Diagnosis not present

## 2015-11-01 HISTORY — PX: PERIPHERAL VASCULAR CATHETERIZATION: SHX172C

## 2015-11-01 LAB — GLUCOSE, CAPILLARY: GLUCOSE-CAPILLARY: 76 mg/dL (ref 65–99)

## 2015-11-01 SURGERY — LOWER EXTREMITY ANGIOGRAPHY
Anesthesia: Moderate Sedation | Site: Leg Lower | Laterality: Left

## 2015-11-01 MED ORDER — IOPAMIDOL (ISOVUE-300) INJECTION 61%
INTRAVENOUS | Status: DC | PRN
  Administered 2015-11-01: 110 mL via INTRA_ARTERIAL

## 2015-11-01 MED ORDER — CEFUROXIME SODIUM 1.5 G IJ SOLR
1.5000 g | INTRAMUSCULAR | Status: AC
  Administered 2015-11-01: 1.5 g via INTRAVENOUS

## 2015-11-01 MED ORDER — HYDROMORPHONE HCL 1 MG/ML IJ SOLN: 1.0000 mg | Freq: Once | INTRAMUSCULAR | Status: DC

## 2015-11-01 MED ORDER — MIDAZOLAM HCL 5 MG/5ML IJ SOLN
INTRAMUSCULAR | Status: AC
  Filled 2015-11-01: qty 5

## 2015-11-01 MED ORDER — HEPARIN SODIUM (PORCINE) 1000 UNIT/ML IJ SOLN
INTRAMUSCULAR | Status: AC
  Filled 2015-11-01: qty 1

## 2015-11-01 MED ORDER — FENTANYL CITRATE (PF) 100 MCG/2ML IJ SOLN
INTRAMUSCULAR | Status: DC | PRN
  Administered 2015-11-01: 25 ug via INTRAVENOUS
  Administered 2015-11-01: 50 ug via INTRAVENOUS
  Administered 2015-11-01 (×2): 25 ug via INTRAVENOUS

## 2015-11-01 MED ORDER — METHYLPREDNISOLONE SODIUM SUCC 125 MG IJ SOLR
125.0000 mg | INTRAMUSCULAR | Status: DC | PRN
Start: 2015-11-01 — End: 2015-11-01

## 2015-11-01 MED ORDER — HEPARIN (PORCINE) IN NACL 2-0.9 UNIT/ML-% IJ SOLN
INTRAMUSCULAR | Status: AC
  Filled 2015-11-01: qty 1000

## 2015-11-01 MED ORDER — ASPIRIN EC 81 MG PO TBEC: 81.0000 mg | DELAYED_RELEASE_TABLET | Freq: Every day | ORAL | 2 refills | Status: DC

## 2015-11-01 MED ORDER — SODIUM CHLORIDE 0.9 % IV SOLN
INTRAVENOUS | Status: DC
  Administered 2015-11-01: 08:00:00 via INTRAVENOUS

## 2015-11-01 MED ORDER — HEPARIN SODIUM (PORCINE) 1000 UNIT/ML IJ SOLN
INTRAMUSCULAR | Status: DC | PRN
  Administered 2015-11-01: 5000 [IU] via INTRAVENOUS

## 2015-11-01 MED ORDER — CLOPIDOGREL BISULFATE 75 MG PO TABS: 75.0000 mg | ORAL_TABLET | Freq: Every day | ORAL | 5 refills | Status: DC

## 2015-11-01 MED ORDER — MIDAZOLAM HCL 2 MG/2ML IJ SOLN
INTRAMUSCULAR | Status: DC | PRN
  Administered 2015-11-01: 1 mg via INTRAVENOUS
  Administered 2015-11-01: 2 mg via INTRAVENOUS
  Administered 2015-11-01 (×2): 0.5 mg via INTRAVENOUS

## 2015-11-01 MED ORDER — FAMOTIDINE 20 MG PO TABS: 40.0000 mg | ORAL_TABLET | ORAL | Status: DC | PRN

## 2015-11-01 MED ORDER — ONDANSETRON HCL 4 MG/2ML IJ SOLN: 4.0000 mg | Freq: Four times a day (QID) | INTRAMUSCULAR | Status: DC | PRN

## 2015-11-01 MED ORDER — ASPIRIN 81 MG PO CHEW: 325.0000 mg | CHEWABLE_TABLET | ORAL | Status: DC

## 2015-11-01 MED ORDER — FENTANYL CITRATE (PF) 100 MCG/2ML IJ SOLN
INTRAMUSCULAR | Status: AC
  Filled 2015-11-01: qty 4

## 2015-11-01 MED ORDER — CLOPIDOGREL BISULFATE 75 MG PO TABS: 300.0000 mg | ORAL_TABLET | ORAL | Status: DC

## 2015-11-01 MED ORDER — LIDOCAINE HCL (PF) 1 % IJ SOLN
INTRAMUSCULAR | Status: AC
  Filled 2015-11-01: qty 30

## 2015-11-01 SURGICAL SUPPLY — 25 items
BALLN ARMADA 7X20X80 (BALLOONS) ×3
BALLN LUTONIX DCB 5X40X130 (BALLOONS) ×3
BALLN LUTONIX DCB 6X60X130 (BALLOONS) ×3
BALLN ULTRVRSE 7X40X75C (BALLOONS) ×3
BALLOON ARMADA 7X20X80 (BALLOONS) IMPLANT
BALLOON LUTONIX DCB 5X40X130 (BALLOONS) IMPLANT
BALLOON LUTONIX DCB 6X60X130 (BALLOONS) IMPLANT
BALLOON ULTRVRSE 7X40X75C (BALLOONS) IMPLANT
CATH PIG 70CM (CATHETERS) ×3 IMPLANT
DEVICE CLOSURE MYNXGRIP 6/7F (Vascular Products) ×2 IMPLANT
DEVICE PRESTO INFLATION (MISCELLANEOUS) ×2 IMPLANT
DEVICE TORQUE (MISCELLANEOUS) ×2 IMPLANT
GLIDEWIRE ANGLED SS 035X260CM (WIRE) ×2 IMPLANT
PACK ANGIOGRAPHY (CUSTOM PROCEDURE TRAY) ×3 IMPLANT
SET INTRO CAPELLA COAXIAL (SET/KITS/TRAYS/PACK) ×3 IMPLANT
SHEATH ANL2 6FRX45 HC (SHEATH) ×2 IMPLANT
SHEATH BALKIN 6FR (SHEATH) IMPLANT
SHEATH BRITE TIP 5FRX11 (SHEATH) ×3 IMPLANT
SHEATH BRITE TIP 6FRX11 (SHEATH) ×2 IMPLANT
STENT LIFESTAR 9X40 (Permanent Stent) ×2 IMPLANT
STENT LIFESTREAM 6X26X80 (Permanent Stent) ×2 IMPLANT
SYR MEDRAD MARK V 150ML (SYRINGE) ×3 IMPLANT
TUBING CONTRAST HIGH PRESS 72 (TUBING) ×3 IMPLANT
WIRE J 3MM .035X145CM (WIRE) ×3 IMPLANT
WIRE MAGIC TORQUE 260C (WIRE) ×2 IMPLANT

## 2015-11-01 NOTE — Op Note (Signed)
[facility]  Percutaneous Study/Intervention Procedural Note   Date of Surgery: 11/01/2015  Surgeon:,  G   Pre-operative Diagnosis: Atherosclerotic occlusive disease bilateral lower extremities with lifestyle limiting claudication and mild rest pain  Post-operative diagnosis:  Same  Procedure(s) Performed:  1.  Abdominal aortogram  2.  Left lower extremity distal runoff third order catheter placement  3.  Percutaneous transluminal angioplasty and stent placement right common iliac artery 4.  Percutaneous transluminal angioplasty and stent placement left common iliac artery  5.  Percutaneous transluminal angioplasty left external iliac artery  6.  Minx closure device right common femoral artery             7.  Ultrasound guided access right common femoral artery   Anesthesia: Conscious sedation was administered under my direct supervision. IV Versed plus fentanyl were utilized. Continuous ECG, pulse oximetry and blood pressure was monitored throughout the entire procedure. Conscious sedation was for a total of 120 minutes.  Sheath: 6 French Ansell  Contrast: 120 cc  Fluoroscopy Time: 9.2 minutes  Indications:  Patient presents to the office with increasing pain in his lower extremities left greater than right noninvasive studies as well as physical examination demonstrated severe atherosclerotic occlusive disease. He is therefore undergoing angiography with the hope for intervention. The risks and benefits were reviewed all questions were answered patient has agreed to proceed.  Procedure:  Damon [name] is a 65 y.o. male who was identified and appropriate procedural time out was performed.  The patient was then placed supine on the table and prepped and draped in the usual sterile fashion.  Ultrasound was used to evaluate the right common femoral artery.  It was patent .  A digital ultrasound image was acquired.  A Seldinger needle was used to  access the right common femoral artery under direct ultrasound guidance and a permanent image was performed.  A 0.035 stiff angled glide was advanced without resistance and a 5Fr sheath was placed.    The pigtail catheter was then positioned at the level of T12 and an AP image of the aorta was obtained. After review the images the pigtail catheter was repositioned above the aortic bifurcation and bilateral oblique views of the pelvis were obtained. Subsequently the detector was returned to the AP position and left lower extremity runoff was obtained.  5000 Units of heparin was given and allowed to circulate for proximally 4 minutes.  The 5 French sheath was then upsized to a 6 French Ansell sheath as well after a antitorque wire was advanced through the pigtail catheter. Magnified images of the aortic bifurcation were then made using hand injection contrast from the sheath. After appropriate sizing a Lutonix balloon was selected for the left common iliac artery stenosis. The 5 x 4 Lutonix balloon was inflated across the 90% common iliac stenosis. Inflation was to 12 atm for 2 minutes. Follow-up imaging demonstrated greater than 50% residual stenosis and therefore a stent would be required. However I elected to treat the left external iliac lesion before stenting. Therefore a 6 x 60 Lutonix balloon was advanced across the external iliac lesion inflation was to 10 atm for 2 full minutes. Follow-up imaging demonstrated less than 15% residual stenosis and therefore no stent was required in the left external iliac. Attention was returned to the left common iliac and a 6 x 29 Lifestream stent was selected for the left common iliac. Subsequently this was postdilated with a 7 x 20 balloon. Follow-up imaging through   the sheath demonstrated excellent apposition of the stent to the arterial wall with wide patency. Distal runoff was then obtained and noted to be unchanged. Attention was then turned to the right  side.  Magnified images in the LAO projection were obtained subsequently a 9 x 40 life*stent was deployed across the right common iliac stenosis was postdilated with a 7 x 40 balloon. Follow-up imaging demonstrated an excellent result with less than 5% residual stenosis.  Oblique view of the right groin was then obtained and a minx device deployed without difficulty. There were no immediate complications.  Findings:   Aortogram:  The abdominal aorta is opacified with a bolus injection contrast. Demonstrates diffuse disease but there are no hemodynamically significant lesions noted in the aorta. There is diffuse disease noted at the origins of the common iliacs however this does not appear to be hemodynamically significant. In the mid and distal portions of both the right and left common iliacs there is a greater than 90% stricture stenosis. There is a greater than 75% stenosis of the left external iliac noted as well.  Left Lower Extremity:  Common femoral is patent as is a large profunda. There is also having no evidence of the location for the origin of the SFA. SFA does reconstituted in the mid thigh but then demonstrates a long segment of subtotal occlusion throughout Hunter's canal. There appears to be to tibial vessel runoff with a posterior tibial and peroneal. The peroneal appears to have a large collateral to the dorsalis pedis posterior tibial fills the lateral plantar.  Following placement of the iliac stents there is now wide patency with less than 10% residual stenosis with rapid flow through the aortic bifurcation and common iliac arteries. Similarly the left external iliac artery is well treated with angioplasty alone using a Lutonix balloon and less than 10% residual stenosis.  Summary:  Successful reconstruction of the bilateral common iliac arteries as well as successful recanalization of the left external iliac. Bilateral SFA occlusions  Disposition: Patient was taken to the recovery  room in stable condition having tolerated the procedure well.  ,  G 11/01/2015,10:00 AM 

## 2015-11-01 NOTE — H&P (Signed)
[facility] History & Physical Update  The patient was interviewed and re-examined.  The patient's previous History and Physical has been reviewed and is unchanged.  There is no change in the plan of care. We plan to proceed with the scheduled procedure.  Parmvir Boomer, Latina Craver, MD  11/01/2015, 8:22 AM

## 2015-11-01 NOTE — Discharge Instructions (Signed)
Angiogram, Care After °Refer to this sheet in the next few weeks. These instructions provide you with information about caring for yourself after your procedure. Your health care provider may also give you more specific instructions. Your treatment has been planned according to current medical practices, but problems sometimes occur. Call your health care provider if you have any problems or questions after your procedure. °WHAT TO EXPECT AFTER THE PROCEDURE °After your procedure, it is typical to have the following: °· Bruising at the catheter insertion site that usually fades within 1-2 weeks. °· Blood collecting in the tissue (hematoma) that may be painful to the touch. It should usually decrease in size and tenderness within 1-2 weeks. °HOME CARE INSTRUCTIONS °· Take medicines only as directed by your health care provider. °· You may shower 24-48 hours after the procedure or as directed by your health care provider. Remove the bandage (dressing) and gently wash the site with plain soap and water. Pat the area dry with a clean towel. Do not rub the site, because this may cause bleeding. °· Do not take baths, swim, or use a hot tub until your health care provider approves. °· Check your insertion site every day for redness, swelling, or drainage. °· Do not apply powder or lotion to the site. °· Do not lift over 10 lb (4.5 kg) for 5 days after your procedure or as directed by your health care provider. °· Ask your health care provider when it is okay to: °¨ Return to work or school. °¨ Resume usual physical activities or sports. °¨ Resume sexual activity. °· Do not drive home if you are discharged the same day as the procedure. Have someone else drive you. °· You may drive 24 hours after the procedure unless otherwise instructed by your health care provider. °· Do not operate machinery or power tools for 24 hours after the procedure or as directed by your health care provider. °· If your procedure was done as an  outpatient procedure, which means that you went home the same day as your procedure, a responsible adult should be with you for the first 24 hours after you arrive home. °· Keep all follow-up visits as directed by your health care provider. This is important. °SEEK MEDICAL CARE IF: °· You have a fever. °· You have chills. °· You have increased bleeding from the catheter insertion site. Hold pressure on the site. °SEEK IMMEDIATE MEDICAL CARE IF: °· You have unusual pain at the catheter insertion site. °· You have redness, warmth, or swelling at the catheter insertion site. °· You have drainage (other than a small amount of blood on the dressing) from the catheter insertion site. °· The catheter insertion site is bleeding, and the bleeding does not stop after 30 minutes of holding steady pressure on the site. °· The area near or just beyond the catheter insertion site becomes pale, cool, tingly, or numb. °  °This information is not intended to replace advice given to you by your health care provider. Make sure you discuss any questions you have with your health care provider. °  °Document Released: 10/12/2004 Document Revised: 04/16/2014 Document Reviewed: 08/27/2012 °Elsevier Interactive Patient Education ©2016 [employer] ° °

## 2015-11-02 ENCOUNTER — Encounter: Payer: Self-pay | Admitting: Vascular Surgery

## 2016-01-13 ENCOUNTER — Emergency Department: Payer: Commercial Managed Care - PPO

## 2016-01-13 ENCOUNTER — Inpatient Hospital Stay
Admission: EM | Admit: 2016-01-13 | Discharge: 2016-01-17 | DRG: 246 | Disposition: A | Discharge status: Home / Self Care | Payer: Commercial Managed Care - PPO | Attending: Internal Medicine | Admitting: Internal Medicine

## 2016-01-13 ENCOUNTER — Inpatient Hospital Stay: Payer: Commercial Managed Care - PPO

## 2016-01-13 ENCOUNTER — Encounter: Payer: Self-pay | Admitting: Nurse Practitioner

## 2016-01-13 ENCOUNTER — Encounter
Admission: EM | Disposition: A | Discharge status: Home / Self Care | Payer: Self-pay | Source: Home / Self Care | Attending: Internal Medicine

## 2016-01-13 ENCOUNTER — Inpatient Hospital Stay (HOSPITAL_COMMUNITY)
Admit: 2016-01-13 | Discharge: 2016-01-13 | Disposition: A | Discharge status: Home / Self Care | Payer: Commercial Managed Care - PPO | Attending: Cardiovascular Disease | Admitting: Cardiovascular Disease

## 2016-01-13 DIAGNOSIS — J9601 Acute respiratory failure with hypoxia: Secondary | ICD-10-CM | POA: Diagnosis present

## 2016-01-13 DIAGNOSIS — Z9114 Patient's other noncompliance with medication regimen: Secondary | ICD-10-CM | POA: Diagnosis not present

## 2016-01-13 DIAGNOSIS — I5021 Acute systolic (congestive) heart failure: Secondary | ICD-10-CM

## 2016-01-13 DIAGNOSIS — R7989 Other specified abnormal findings of blood chemistry: Secondary | ICD-10-CM

## 2016-01-13 DIAGNOSIS — E1122 Type 2 diabetes mellitus with diabetic chronic kidney disease: Secondary | ICD-10-CM | POA: Diagnosis present

## 2016-01-13 DIAGNOSIS — I255 Ischemic cardiomyopathy: Secondary | ICD-10-CM | POA: Diagnosis present

## 2016-01-13 DIAGNOSIS — Z7982 Long term (current) use of aspirin: Secondary | ICD-10-CM

## 2016-01-13 DIAGNOSIS — I2102 ST elevation (STEMI) myocardial infarction involving left anterior descending coronary artery: Secondary | ICD-10-CM | POA: Diagnosis not present

## 2016-01-13 DIAGNOSIS — I130 Hypertensive heart and chronic kidney disease with heart failure and stage 1 through stage 4 chronic kidney disease, or unspecified chronic kidney disease: Secondary | ICD-10-CM | POA: Diagnosis present

## 2016-01-13 DIAGNOSIS — E1151 Type 2 diabetes mellitus with diabetic peripheral angiopathy without gangrene: Secondary | ICD-10-CM | POA: Diagnosis present

## 2016-01-13 DIAGNOSIS — Z95820 Peripheral vascular angioplasty status with implants and grafts: Secondary | ICD-10-CM

## 2016-01-13 DIAGNOSIS — I447 Left bundle-branch block, unspecified: Secondary | ICD-10-CM | POA: Diagnosis present

## 2016-01-13 DIAGNOSIS — F17200 Nicotine dependence, unspecified, uncomplicated: Secondary | ICD-10-CM | POA: Diagnosis present

## 2016-01-13 DIAGNOSIS — R778 Other specified abnormalities of plasma proteins: Secondary | ICD-10-CM

## 2016-01-13 DIAGNOSIS — Z7902 Long term (current) use of antithrombotics/antiplatelets: Secondary | ICD-10-CM | POA: Diagnosis not present

## 2016-01-13 DIAGNOSIS — N170 Acute kidney failure with tubular necrosis: Secondary | ICD-10-CM | POA: Diagnosis present

## 2016-01-13 DIAGNOSIS — Z4659 Encounter for fitting and adjustment of other gastrointestinal appliance and device: Secondary | ICD-10-CM

## 2016-01-13 DIAGNOSIS — E785 Hyperlipidemia, unspecified: Secondary | ICD-10-CM | POA: Diagnosis present

## 2016-01-13 DIAGNOSIS — I5023 Acute on chronic systolic (congestive) heart failure: Secondary | ICD-10-CM | POA: Diagnosis present

## 2016-01-13 DIAGNOSIS — N183 Chronic kidney disease, stage 3 (moderate): Secondary | ICD-10-CM | POA: Diagnosis present

## 2016-01-13 DIAGNOSIS — Z79899 Other long term (current) drug therapy: Secondary | ICD-10-CM

## 2016-01-13 DIAGNOSIS — J441 Chronic obstructive pulmonary disease with (acute) exacerbation: Secondary | ICD-10-CM | POA: Diagnosis present

## 2016-01-13 DIAGNOSIS — I2109 ST elevation (STEMI) myocardial infarction involving other coronary artery of anterior wall: Principal | ICD-10-CM | POA: Diagnosis present

## 2016-01-13 DIAGNOSIS — J9600 Acute respiratory failure, unspecified whether with hypoxia or hypercapnia: Secondary | ICD-10-CM | POA: Diagnosis present

## 2016-01-13 DIAGNOSIS — E7800 Pure hypercholesterolemia, unspecified: Secondary | ICD-10-CM | POA: Diagnosis not present

## 2016-01-13 DIAGNOSIS — R0602 Shortness of breath: Secondary | ICD-10-CM | POA: Diagnosis present

## 2016-01-13 DIAGNOSIS — Z794 Long term (current) use of insulin: Secondary | ICD-10-CM

## 2016-01-13 DIAGNOSIS — I2510 Atherosclerotic heart disease of native coronary artery without angina pectoris: Secondary | ICD-10-CM | POA: Diagnosis not present

## 2016-01-13 DIAGNOSIS — I214 Non-ST elevation (NSTEMI) myocardial infarction: Secondary | ICD-10-CM | POA: Diagnosis not present

## 2016-01-13 DIAGNOSIS — I10 Essential (primary) hypertension: Secondary | ICD-10-CM

## 2016-01-13 HISTORY — DX: Tobacco use: Z72.0

## 2016-01-13 HISTORY — DX: Essential (primary) hypertension: I10

## 2016-01-13 HISTORY — PX: CARDIAC CATHETERIZATION: SHX172

## 2016-01-13 HISTORY — DX: Chronic kidney disease, stage 3 unspecified: N18.30

## 2016-01-13 HISTORY — DX: Chronic kidney disease, stage 3 (moderate): N18.3

## 2016-01-13 HISTORY — DX: Peripheral vascular disease, unspecified: I73.9

## 2016-01-13 LAB — BASIC METABOLIC PANEL
Anion gap: 10 (ref 5–15)
BUN: 25 mg/dL — AB (ref 6–20)
CALCIUM: 8.8 mg/dL — AB (ref 8.9–10.3)
CO2: 21 mmol/L — ABNORMAL LOW (ref 22–32)
CREATININE: 1.52 mg/dL — AB (ref 0.61–1.24)
Chloride: 105 mmol/L (ref 101–111)
GFR calc non Af Amer: 47 mL/min — ABNORMAL LOW (ref >60)
GFR, EST AFRICAN AMERICAN: 55 mL/min — AB (ref >60)
Glucose, Bld: 301 mg/dL — ABNORMAL HIGH (ref 65–99)
Potassium: 3.6 mmol/L (ref 3.5–5.1)
SODIUM: 136 mmol/L (ref 135–145)

## 2016-01-13 LAB — ETHANOL

## 2016-01-13 LAB — CBC WITH DIFFERENTIAL/PLATELET
Basophils Absolute: 0.1 10*3/uL (ref 0–0.1)
Basophils Relative: 1 %
Eosinophils Absolute: 0.1 10*3/uL (ref 0–0.7)
Eosinophils Relative: 1 %
HEMATOCRIT: 39.5 % — AB (ref 40.0–52.0)
Hemoglobin: 13.4 g/dL (ref 13.0–18.0)
LYMPHS ABS: 3.9 10*3/uL — AB (ref 1.0–3.6)
LYMPHS PCT: 34 %
MCH: 33.2 pg (ref 26.0–34.0)
MCHC: 34 g/dL (ref 32.0–36.0)
MCV: 97.5 fL (ref 80.0–100.0)
MONO ABS: 0.6 10*3/uL (ref 0.2–1.0)
MONOS PCT: 6 %
NEUTROS ABS: 6.8 10*3/uL — AB (ref 1.4–6.5)
Neutrophils Relative %: 58 %
Platelets: 257 10*3/uL (ref 150–440)
RBC: 4.05 MIL/uL — ABNORMAL LOW (ref 4.40–5.90)
RDW: 14.1 % (ref 11.5–14.5)
WBC: 11.5 10*3/uL — ABNORMAL HIGH (ref 3.8–10.6)

## 2016-01-13 LAB — CBC
HCT: 35.4 % — ABNORMAL LOW (ref 40.0–52.0)
HEMATOCRIT: 39.5 % — AB (ref 40.0–52.0)
HEMOGLOBIN: 12.2 g/dL — AB (ref 13.0–18.0)
Hemoglobin: 13.1 g/dL (ref 13.0–18.0)
MCH: 31.9 pg (ref 26.0–34.0)
MCH: 32.7 pg (ref 26.0–34.0)
MCHC: 33.2 g/dL (ref 32.0–36.0)
MCHC: 34.5 g/dL (ref 32.0–36.0)
MCV: 96.1 fL (ref 80.0–100.0)
MCV: 94.7 fL (ref 80.0–100.0)
PLATELETS: 230 10*3/uL (ref 150–440)
Platelets: 252 10*3/uL (ref 150–440)
RBC: 4.11 MIL/uL — ABNORMAL LOW (ref 4.40–5.90)
RBC: 3.74 MIL/uL — AB (ref 4.40–5.90)
RDW: 14 % (ref 11.5–14.5)
RDW: 14.1 % (ref 11.5–14.5)
WBC: 16.3 10*3/uL — ABNORMAL HIGH (ref 3.8–10.6)
WBC: 12.4 10*3/uL — AB (ref 3.8–10.6)

## 2016-01-13 LAB — MAGNESIUM
Magnesium: 2 mg/dL (ref 1.7–2.4)
Magnesium: 1.7 mg/dL (ref 1.7–2.4)

## 2016-01-13 LAB — BLOOD GAS, ARTERIAL
ALLENS TEST (PASS/FAIL): POSITIVE — AB
Acid-base deficit: 8.9 mmol/L — ABNORMAL HIGH (ref 0.0–2.0)
BICARBONATE: 20.3 mmol/L (ref 20.0–28.0)
FIO2: 1
MECHVT: 500 mL
O2 Saturation: 99.9 %
PATIENT TEMPERATURE: 37
PEEP/CPAP: 5 cmH2O
PO2 ART: 322 mmHg — AB (ref 83.0–108.0)
RATE: 20 resp/min
pCO2 arterial: 57 mmHg — ABNORMAL HIGH (ref 32.0–48.0)
pH, Arterial: 7.16 — CL (ref 7.350–7.450)

## 2016-01-13 LAB — MRSA PCR SCREENING: MRSA BY PCR: NEGATIVE

## 2016-01-13 LAB — TROPONIN I
TROPONIN I: 0.32 ng/mL — AB (ref <0.03)
Troponin I: 3.28 ng/mL (ref <0.03)

## 2016-01-13 LAB — URINE DRUG SCREEN, QUALITATIVE (ARMC ONLY)
Amphetamines, Ur Screen: POSITIVE — AB
BARBITURATES, UR SCREEN: NOT DETECTED
BENZODIAZEPINE, UR SCRN: POSITIVE — AB
COCAINE METABOLITE, UR ~~LOC~~: NOT DETECTED
Cannabinoid 50 Ng, Ur ~~LOC~~: NOT DETECTED
MDMA (Ecstasy)Ur Screen: NOT DETECTED
METHADONE SCREEN, URINE: NOT DETECTED
Opiate, Ur Screen: NOT DETECTED
Phencyclidine (PCP) Ur S: NOT DETECTED
TRICYCLIC, UR SCREEN: NOT DETECTED

## 2016-01-13 LAB — PHOSPHORUS
PHOSPHORUS: 2.9 mg/dL (ref 2.5–4.6)
Phosphorus: 4 mg/dL (ref 2.5–4.6)

## 2016-01-13 LAB — URINALYSIS COMPLETE WITH MICROSCOPIC (ARMC ONLY)
Bilirubin Urine: NEGATIVE
Ketones, ur: NEGATIVE mg/dL
Leukocytes, UA: NEGATIVE
Nitrite: NEGATIVE
PROTEIN: 100 mg/dL — AB
Specific Gravity, Urine: 1.017 (ref 1.005–1.030)
pH: 5 (ref 5.0–8.0)

## 2016-01-13 LAB — COMPREHENSIVE METABOLIC PANEL
ALT: 46 U/L (ref 17–63)
ANION GAP: 10 (ref 5–15)
AST: 70 U/L — ABNORMAL HIGH (ref 15–41)
Albumin: 3.8 g/dL (ref 3.5–5.0)
Alkaline Phosphatase: 106 U/L (ref 38–126)
BILIRUBIN TOTAL: 0.6 mg/dL (ref 0.3–1.2)
BUN: 22 mg/dL — ABNORMAL HIGH (ref 6–20)
CALCIUM: 8.8 mg/dL — AB (ref 8.9–10.3)
CO2: 23 mmol/L (ref 22–32)
Chloride: 106 mmol/L (ref 101–111)
Creatinine, Ser: 1.53 mg/dL — ABNORMAL HIGH (ref 0.61–1.24)
GFR calc Af Amer: 54 mL/min — ABNORMAL LOW (ref >60)
GFR, EST NON AFRICAN AMERICAN: 47 mL/min — AB (ref >60)
Glucose, Bld: 366 mg/dL — ABNORMAL HIGH (ref 65–99)
POTASSIUM: 3.6 mmol/L (ref 3.5–5.1)
Sodium: 139 mmol/L (ref 135–145)
TOTAL PROTEIN: 7.1 g/dL (ref 6.5–8.1)

## 2016-01-13 LAB — ECHOCARDIOGRAM COMPLETE: Weight: 2766.4 oz

## 2016-01-13 LAB — LACTIC ACID, PLASMA
LACTIC ACID, VENOUS: 3.7 mmol/L — AB (ref 0.5–1.9)
Lactic Acid, Venous: 5.3 mmol/L (ref 0.5–1.9)

## 2016-01-13 LAB — GLUCOSE, CAPILLARY
GLUCOSE-CAPILLARY: 382 mg/dL — AB (ref 65–99)
GLUCOSE-CAPILLARY: 379 mg/dL — AB (ref 65–99)
Glucose-Capillary: 299 mg/dL — ABNORMAL HIGH (ref 65–99)
Glucose-Capillary: 354 mg/dL — ABNORMAL HIGH (ref 65–99)
Glucose-Capillary: 279 mg/dL — ABNORMAL HIGH (ref 65–99)

## 2016-01-13 LAB — BRAIN NATRIURETIC PEPTIDE: B NATRIURETIC PEPTIDE 5: 2233 pg/mL — AB (ref 0.0–100.0)

## 2016-01-13 LAB — PROTIME-INR
INR: 1.11
INR: 1.19
PROTHROMBIN TIME: 15.2 s (ref 11.4–15.2)
Prothrombin Time: 14.3 seconds (ref 11.4–15.2)

## 2016-01-13 LAB — APTT: APTT: 27 s (ref 24–36)

## 2016-01-13 SURGERY — LEFT HEART CATH AND CORONARY ANGIOGRAPHY
Anesthesia: General

## 2016-01-13 MED ORDER — TIROFIBAN HCL IN NACL 5-0.9 MG/100ML-% IV SOLN
INTRAVENOUS | Status: AC
  Filled 2016-01-13: qty 100

## 2016-01-13 MED ORDER — IPRATROPIUM-ALBUTEROL 0.5-2.5 (3) MG/3ML IN SOLN
3.0000 mL | RESPIRATORY_TRACT | Status: DC
  Administered 2016-01-13 – 2016-01-15 (×11): 3 mL via RESPIRATORY_TRACT
  Filled 2016-01-13 (×10): qty 3

## 2016-01-13 MED ORDER — ORAL CARE MOUTH RINSE
15.0000 mL | Freq: Four times a day (QID) | OROMUCOSAL | Status: DC
  Administered 2016-01-14 (×2): 15 mL via OROMUCOSAL

## 2016-01-13 MED ORDER — SODIUM CHLORIDE 0.9% FLUSH
3.0000 mL | Freq: Two times a day (BID) | INTRAVENOUS | Status: DC
  Administered 2016-01-13 – 2016-01-15 (×5): 3 mL via INTRAVENOUS

## 2016-01-13 MED ORDER — TICAGRELOR 90 MG PO TABS
90.0000 mg | ORAL_TABLET | Freq: Two times a day (BID) | ORAL | Status: DC
  Administered 2016-01-13 – 2016-01-17 (×7): 90 mg via ORAL
  Filled 2016-01-13 (×7): qty 1

## 2016-01-13 MED ORDER — PROPOFOL 1000 MG/100ML IV EMUL
5.0000 ug/kg/min | INTRAVENOUS | Status: DC
  Administered 2016-01-13: 63.776 ug/kg/min via INTRAVENOUS
  Administered 2016-01-13 (×3): 65 ug/kg/min via INTRAVENOUS
  Administered 2016-01-13: 60 ug/kg/min via INTRAVENOUS
  Administered 2016-01-14 (×2): 50 ug/kg/min via INTRAVENOUS
  Filled 2016-01-13 (×6): qty 100

## 2016-01-13 MED ORDER — ASPIRIN 300 MG RE SUPP
300.0000 mg | Freq: Once | RECTAL | Status: AC
  Administered 2016-01-13: 300 mg via RECTAL
  Filled 2016-01-13: qty 1

## 2016-01-13 MED ORDER — HEPARIN SODIUM (PORCINE) 5000 UNIT/ML IJ SOLN
5000.0000 [IU] | Freq: Three times a day (TID) | INTRAMUSCULAR | Status: DC
  Administered 2016-01-13 – 2016-01-17 (×11): 5000 [IU] via SUBCUTANEOUS
  Filled 2016-01-13 (×11): qty 1

## 2016-01-13 MED ORDER — SODIUM CHLORIDE 0.9 % IV SOLN: 250.0000 mL | INTRAVENOUS | Status: DC | PRN

## 2016-01-13 MED ORDER — CEFTRIAXONE SODIUM 1 G IJ SOLR
1.0000 g | Freq: Once | INTRAMUSCULAR | Status: AC
  Administered 2016-01-13: 1 g via INTRAVENOUS
  Filled 2016-01-13: qty 10

## 2016-01-13 MED ORDER — INSULIN REGULAR HUMAN 100 UNIT/ML IJ SOLN
10.0000 [IU] | Freq: Once | INTRAMUSCULAR | Status: AC
  Administered 2016-01-13: 10 [IU] via INTRAVENOUS
  Filled 2016-01-13: qty 0.1

## 2016-01-13 MED ORDER — VITAL HIGH PROTEIN PO LIQD: 1000.0000 mL | ORAL | Status: DC

## 2016-01-13 MED ORDER — ASPIRIN 81 MG PO CHEW
81.0000 mg | CHEWABLE_TABLET | Freq: Every day | ORAL | Status: DC
  Administered 2016-01-13 – 2016-01-17 (×4): 81 mg via ORAL
  Filled 2016-01-13 (×4): qty 1

## 2016-01-13 MED ORDER — BUDESONIDE 0.5 MG/2ML IN SUSP
0.5000 mg | Freq: Two times a day (BID) | RESPIRATORY_TRACT | Status: DC
  Administered 2016-01-13 – 2016-01-15 (×5): 0.5 mg via RESPIRATORY_TRACT
  Filled 2016-01-13 (×8): qty 2

## 2016-01-13 MED ORDER — PRO-STAT SUGAR FREE PO LIQD
30.0000 mL | Freq: Four times a day (QID) | ORAL | Status: DC
  Administered 2016-01-13 (×2): 30 mL

## 2016-01-13 MED ORDER — METHYLPREDNISOLONE SODIUM SUCC 125 MG IJ SOLR
125.0000 mg | Freq: Once | INTRAMUSCULAR | Status: AC
  Administered 2016-01-13: 125 mg via INTRAVENOUS
  Filled 2016-01-13: qty 2

## 2016-01-13 MED ORDER — NITROGLYCERIN 5 MG/ML IV SOLN
INTRAVENOUS | Status: AC
  Filled 2016-01-13: qty 10

## 2016-01-13 MED ORDER — HEPARIN SODIUM (PORCINE) 1000 UNIT/ML IJ SOLN
INTRAMUSCULAR | Status: AC
  Filled 2016-01-13: qty 1

## 2016-01-13 MED ORDER — PRO-STAT SUGAR FREE PO LIQD: 30.0000 mL | Freq: Two times a day (BID) | ORAL | Status: DC

## 2016-01-13 MED ORDER — PROPOFOL 1000 MG/100ML IV EMUL
5.0000 ug/kg/min | Freq: Once | INTRAVENOUS | Status: AC
  Administered 2016-01-13: 64.1 ug/kg/min via INTRAVENOUS

## 2016-01-13 MED ORDER — NITROGLYCERIN 1 MG/10 ML FOR IR/CATH LAB
INTRA_ARTERIAL | Status: DC | PRN
  Administered 2016-01-13: 200 ug via INTRACORONARY
  Administered 2016-01-13: 300 ug via INTRACORONARY

## 2016-01-13 MED ORDER — MIDAZOLAM HCL 2 MG/2ML IJ SOLN
2.0000 mg | Freq: Once | INTRAMUSCULAR | Status: AC
  Administered 2016-01-13: 2 mg via INTRAVENOUS

## 2016-01-13 MED ORDER — VERAPAMIL HCL 2.5 MG/ML IV SOLN
INTRAVENOUS | Status: AC
  Filled 2016-01-13: qty 2

## 2016-01-13 MED ORDER — IPRATROPIUM-ALBUTEROL 0.5-2.5 (3) MG/3ML IN SOLN
3.0000 mL | Freq: Once | RESPIRATORY_TRACT | Status: AC
  Administered 2016-01-13: 3 mL via RESPIRATORY_TRACT
  Filled 2016-01-13: qty 3

## 2016-01-13 MED ORDER — TICAGRELOR 90 MG PO TABS
180.0000 mg | ORAL_TABLET | Freq: Once | ORAL | Status: AC
  Administered 2016-01-13: 180 mg via ORAL
  Filled 2016-01-13: qty 2

## 2016-01-13 MED ORDER — TIROFIBAN (AGGRASTAT) BOLUS VIA INFUSION
INTRAVENOUS | Status: DC | PRN
  Administered 2016-01-13: 1960 ug via INTRAVENOUS

## 2016-01-13 MED ORDER — CARVEDILOL 3.125 MG PO TABS
3.1250 mg | ORAL_TABLET | Freq: Two times a day (BID) | ORAL | Status: DC
  Administered 2016-01-13 – 2016-01-14 (×2): 3.125 mg via ORAL
  Filled 2016-01-13 (×2): qty 1

## 2016-01-13 MED ORDER — FAMOTIDINE IN NACL 20-0.9 MG/50ML-% IV SOLN
20.0000 mg | Freq: Two times a day (BID) | INTRAVENOUS | Status: DC
  Administered 2016-01-13 – 2016-01-15 (×6): 20 mg via INTRAVENOUS
  Filled 2016-01-13 (×9): qty 50

## 2016-01-13 MED ORDER — HEPARIN BOLUS VIA INFUSION
4000.0000 [IU] | Freq: Once | INTRAVENOUS | Status: AC
  Administered 2016-01-13: 4000 [IU] via INTRAVENOUS
  Filled 2016-01-13: qty 4000

## 2016-01-13 MED ORDER — IOPAMIDOL (ISOVUE-300) INJECTION 61%
INTRAVENOUS | Status: DC | PRN
  Administered 2016-01-13: 200 mL via INTRA_ARTERIAL

## 2016-01-13 MED ORDER — TIROFIBAN HCL IN NACL 5-0.9 MG/100ML-% IV SOLN
INTRAVENOUS | Status: DC | PRN
  Administered 2016-01-13: 0.075 ug/kg/min via INTRAVENOUS

## 2016-01-13 MED ORDER — ATORVASTATIN CALCIUM 20 MG PO TABS
80.0000 mg | ORAL_TABLET | Freq: Every day | ORAL | Status: DC
  Administered 2016-01-13 – 2016-01-17 (×5): 80 mg via ORAL
  Filled 2016-01-13 (×5): qty 4

## 2016-01-13 MED ORDER — DEXTROSE 5 % IV SOLN: 500.0000 mg | Freq: Once | INTRAVENOUS | Status: DC

## 2016-01-13 MED ORDER — FUROSEMIDE 10 MG/ML IJ SOLN
20.0000 mg | Freq: Once | INTRAMUSCULAR | Status: AC
  Administered 2016-01-13: 20 mg via INTRAVENOUS
  Filled 2016-01-13: qty 2

## 2016-01-13 MED ORDER — VITAL HIGH PROTEIN PO LIQD
1000.0000 mL | ORAL | Status: DC
  Administered 2016-01-13 (×2): 1000 mL

## 2016-01-13 MED ORDER — SODIUM CHLORIDE 0.9 % IV SOLN
INTRAVENOUS | Status: DC
  Administered 2016-01-13: 16:00:00 via INTRAVENOUS

## 2016-01-13 MED ORDER — HEPARIN (PORCINE) IN NACL 2-0.9 UNIT/ML-% IJ SOLN
INTRAMUSCULAR | Status: AC
  Filled 2016-01-13: qty 1000

## 2016-01-13 MED ORDER — VECURONIUM BROMIDE 10 MG IV SOLR
10.0000 mg | Freq: Once | INTRAVENOUS | Status: AC
  Administered 2016-01-13: 10 mg via INTRAVENOUS

## 2016-01-13 MED ORDER — INSULIN ASPART 100 UNIT/ML ~~LOC~~ SOLN
2.0000 [IU] | SUBCUTANEOUS | Status: DC
  Administered 2016-01-13: 10 [IU] via SUBCUTANEOUS
  Administered 2016-01-14: 4 [IU] via SUBCUTANEOUS
  Administered 2016-01-14: 2 [IU] via SUBCUTANEOUS
  Administered 2016-01-14: 4 [IU] via SUBCUTANEOUS
  Administered 2016-01-15: 2 [IU] via SUBCUTANEOUS
  Filled 2016-01-13: qty 2
  Filled 2016-01-13: qty 10
  Filled 2016-01-13 (×2): qty 4
  Filled 2016-01-13: qty 2
  Filled 2016-01-13: qty 10

## 2016-01-13 MED ORDER — SODIUM CHLORIDE 0.9 % IV SOLN: INTRAVENOUS | Status: DC

## 2016-01-13 MED ORDER — SODIUM CHLORIDE 0.9% FLUSH: 3.0000 mL | INTRAVENOUS | Status: DC | PRN

## 2016-01-13 MED ORDER — ASPIRIN 81 MG PO CHEW
81.0000 mg | CHEWABLE_TABLET | ORAL | Status: DC
Start: 2016-01-14 — End: 2016-01-13

## 2016-01-13 MED ORDER — METHYLPREDNISOLONE SODIUM SUCC 125 MG IJ SOLR: 40.0000 mg | INTRAMUSCULAR | Status: DC

## 2016-01-13 MED ORDER — VERAPAMIL HCL 2.5 MG/ML IV SOLN
INTRAVENOUS | Status: DC | PRN
  Administered 2016-01-13: 2.5 mg via INTRA_ARTERIAL

## 2016-01-13 MED ORDER — SODIUM CHLORIDE 0.9% FLUSH: 3.0000 mL | Freq: Two times a day (BID) | INTRAVENOUS | Status: DC

## 2016-01-13 MED ORDER — MIDAZOLAM HCL 2 MG/2ML IJ SOLN
4.0000 mg | Freq: Once | INTRAMUSCULAR | Status: AC
  Administered 2016-01-13: 4 mg via INTRAVENOUS

## 2016-01-13 MED ORDER — HEPARIN (PORCINE) IN NACL 100-0.45 UNIT/ML-% IJ SOLN
950.0000 [IU]/h | INTRAMUSCULAR | Status: DC
  Administered 2016-01-13: 950 [IU]/h via INTRAVENOUS
  Filled 2016-01-13: qty 250

## 2016-01-13 MED ORDER — HEPARIN SODIUM (PORCINE) 1000 UNIT/ML IJ SOLN
INTRAMUSCULAR | Status: DC | PRN
  Administered 2016-01-13: 2000 [IU] via INTRAVENOUS
  Administered 2016-01-13: 4000 [IU] via INTRAVENOUS

## 2016-01-13 MED ORDER — CHLORHEXIDINE GLUCONATE 0.12% ORAL RINSE (MEDLINE KIT)
15.0000 mL | Freq: Two times a day (BID) | OROMUCOSAL | Status: DC
  Administered 2016-01-14: 15 mL via OROMUCOSAL

## 2016-01-13 SURGICAL SUPPLY — 14 items
BALLN MINITREK RX 1.5X15 (BALLOONS) ×3
BALLN TREK RX 2.5X20 (BALLOONS) ×3
BALLOON MINITREK RX 1.5X15 (BALLOONS) IMPLANT
BALLOON TREK RX 2.5X20 (BALLOONS) IMPLANT
CATH OPTITORQUE JACKY 4.0 5F (CATHETERS) ×2 IMPLANT
CATH VISTA GUIDE 6FR JL3.5 (CATHETERS) ×2 IMPLANT
DEVICE INFLAT 30 PLUS (MISCELLANEOUS) ×2 IMPLANT
DEVICE RAD TR BAND REGULAR (VASCULAR PRODUCTS) ×2 IMPLANT
GLIDESHEATH SLEND SS 6F .021 (SHEATH) ×2 IMPLANT
KIT MANI 3VAL PERCEP (MISCELLANEOUS) ×3 IMPLANT
PACK CARDIAC CATH (CUSTOM PROCEDURE TRAY) ×3 IMPLANT
STENT XIENCE ALPINE RX 3.25X28 (Permanent Stent) ×2 IMPLANT
WIRE RUNTHROUGH .014X180CM (WIRE) ×2 IMPLANT
WIRE SAFE-T 1.5MM-J .035X260CM (WIRE) ×2 IMPLANT

## 2016-01-13 NOTE — ED Notes (Signed)
Report was called to CCU but unable to transport due to  echo

## 2016-01-13 NOTE — ED Notes (Signed)
Pt to the cath lab at this time with RT.

## 2016-01-13 NOTE — ED Notes (Signed)
Per MD Kasa (who is present in room) , pt received 10 mg vecuronium and 4 mg versed.

## 2016-01-13 NOTE — Progress Notes (Signed)
Initial Nutrition Assessment  DOCUMENTATION CODES:   Not applicable  INTERVENTION:  Dietitian Consult received via Adult Tube Feeding Protocol for management of TF. Recommend advancing tube prior to initiation of TF as per recommendation from xray report.  With current diprivan, recommend starting Vital High Protein at rate of 20 ml/hr with Prostat QID providing 880 kcals, 102 g protein and minimal free water. Meets 100% estimated protein needs. Pt receiving significant calories from diprivan at present. Continue to assess Recommend addition of at least sliding scale insulin as FSBS >180  NUTRITION DIAGNOSIS:   Inadequate oral intake related to acute illness as evidenced by NPO status.  GOAL:   Provide needs based on ASPEN/SCCM guidelines  MONITOR:   TF tolerance, Labs, Weight trends, Vent status  REASON FOR ASSESSMENT:   Consult Enteral/tube feeding initiation and management  ASSESSMENT:   65 yo male admitted with severe respiratory failure from acute COPD exacerbation with acute NSTEMI/STEMI, ARF from ATN, acute CHF    Patient is currently intubated on ventilator support MV: 13.2 L/min Temp (24hrs), Avg:96 F (35.6 C), Min:96 F (35.6 C), Max:96 F (35.6 C)  Propofol: 30.6 ml/hr (808 kcals)  OG tube in stomach, advancement of 5-10 cm recommended per xray report  Past Medical History:  Diagnosis Date  . CKD (chronic kidney disease), stage III   . Diabetes mellitus without complication (HCC)   . Diverticulitis   . Essential hypertension   . PAD (peripheral artery disease) (HCC)    a. 10/2015 s/p PTA and stenting of the RCIA, LCIA, and PTA of the [employer] Environmental manager([place]).  . Tobacco abuse     Diet Order:   NPO  Skin:  Reviewed, no issues  Last BM:  no documented BM   Labs:  Glucose Profile:  Recent Labs  01/13/16 1342  GLUCAP 299*   Meds:  diprivan, solumedrol  Height:   Ht Readings from Last 1 Encounters:  11/01/15 5\' 10"  (1.778 m)    Weight:   Wt  Readings from Last 1 Encounters:  01/13/16 172 lb 14.4 oz (78.4 kg)    BMI:  Body mass index is 24.81 kg/m.  Estimated Nutritional Needs:   Kcal:  1660 kcals  Protein:  94-156 g  Fluid:  >/= 1.6 L  EDUCATION NEEDS:   No education needs identified at this time  Romelle StarcherCate Abdulmalik Darco MS, RD, LDN [phone] Pager  [phone] Weekend/On-Call Pager

## 2016-01-13 NOTE — Progress Notes (Signed)
NG tube advanced from 65 to 75cm at the lip per abd xray report

## 2016-01-13 NOTE — Progress Notes (Signed)
ANTICOAGULATION CONSULT NOTE - Initial Consult  Pharmacy Consult for heparin drip Indication: chest pain/ACS  No Known Allergies  Patient Measurements: Weight: 172 lb 14.4 oz (78.4 kg) Heparin Dosing Weight: 78 kg  Vital Signs: Temp: 96 F (35.6 C) (10/06 0912) Temp Source: Axillary (10/06 0912)  Labs:  Recent Labs  01/13/16 0840  HGB 13.4  HCT 39.5*  PLT 257  CREATININE 1.53*  TROPONINI 0.32*    Estimated Creatinine Clearance: 51 mL/min (by C-G formula based on SCr of 1.53 mg/dL (H)).   Medical History: Past Medical History:  Diagnosis Date  . Diabetes mellitus without complication (HCC)   . Diverticulitis   . Hypertension    Medications:  Scheduled:  . aspirin  300 mg Rectal Once  . heparin  4,000 Units Intravenous Once    Assessment: Pharmacy consulted to dose and monitor heparin drip in this 65 year old male for ACS. Patient unable to communicate current medications - called listed [facility] pharmacy who confirmed patient was not prescribed any anticoagulants.   Baseline labs have been ordered  Goal of Therapy:  Heparin level 0.3-0.7 units/ml Monitor platelets by anticoagulation protocol: Yes   Plan:  Give 4000 units bolus x 1 Start heparin infusion at 950 units/hr Check anti-Xa level in 6 hours and daily while on heparin Continue to monitor H&H and platelets  Cindi CarbonMary M Cletus Mehlhoff, PharmD Clinical Pharmacist 01/13/2016,9:42 AM

## 2016-01-13 NOTE — ED Notes (Signed)
Pt pulling at IV. Reaching for ET tube.  Pt rolling around in bed.

## 2016-01-13 NOTE — ED Notes (Signed)
ECHO being performed at bedside.

## 2016-01-13 NOTE — Progress Notes (Signed)
Dr. Kirke CorinArida at bedside and has been updated, right radial site assessed and has no signs of bleeding noted, band deflated completely. VS wnl, new orders obtained. Family at bedside have been updated. No concerns at this time.

## 2016-01-13 NOTE — H&P (Signed)
PULMONARY / CRITICAL CARE MEDICINE   Name: LASHUN MCCANTS MRN: [id number] DOB: October 24, 1950    ADMISSION DATE:  01/13/2016   CHIEF COMPLAINT:  resp failure  HISTORY OF PRESENT ILLNESS:  65 yo AAM seen in ER today for acute resp failure with reported history of medication noncompliance per his wife with breathing problems, inhaler use, unknown whether or not he is had any cardiac disease,  - reports no history of heart attack.  -Never been previously intubated. - She states that he was complaining of some trouble breathing last night  - he did not go into work overnight. This morning when she woke up he was already awake and having a lot more trouble breathing and was opening the refrigerator to get cold air and then went outside and became altered when she decided to call EMS.  Patient had low o2 sats 50%, patient was emergently intubated, placed on full vent support  PAST MEDICAL HISTORY :   has a past medical history of CKD (chronic kidney disease), stage III; Diabetes mellitus without complication (HCC); Diverticulitis; Essential hypertension; PAD (peripheral artery disease) (HCC); and Tobacco abuse.  has a past surgical history that includes Neck surgery and Cardiac catheterization (Left, 11/01/2015). Prior to Admission medications   Medication Sig Start Date End Date Taking? Authorizing Provider  amLODipine (NORVASC) 10 MG tablet Take 10 mg by mouth daily.    Historical Provider, MD  aspirin EC 81 MG tablet Take 1 tablet (81 mg total) by mouth daily. 11/02/15   Renford Dills, MD  clopidogrel (PLAVIX) 75 MG tablet Take 1 tablet (75 mg total) by mouth daily. 11/02/15   Renford Dills, MD  enalapril (VASOTEC) 20 MG tablet Take 40 mg by mouth daily.    Historical Provider, MD  gabapentin (NEURONTIN) 300 MG capsule Take 300 mg by mouth 2 (two) times daily.    Historical Provider, MD  hydrochlorothiazide (HYDRODIURIL) 25 MG tablet Take 25 mg by mouth daily.    Historical Provider, MD   Insulin Glargine (LANTUS SOLOSTAR) 100 UNIT/ML Solostar Pen Inject 30 Units into the skin daily.     Historical Provider, MD  metoprolol (LOPRESSOR) 50 MG tablet Take 50 mg by mouth 2 (two) times daily.    Historical Provider, MD  Multiple Vitamin (MULTIVITAMIN WITH MINERALS) TABS tablet Take 1 tablet by mouth daily.    Historical Provider, MD  vitamin B-12 (CYANOCOBALAMIN) 1000 MCG tablet Take 1,000 mcg by mouth daily.    Historical Provider, MD  vitamin E 100 UNIT capsule Take 100 Units by mouth daily.    Historical Provider, MD   No Known Allergies  FAMILY HISTORY:  has no family status information on file.   SOCIAL HISTORY:  reports that he has been smoking.  He uses smokeless tobacco. He reports that he does not drink alcohol or use drugs.  REVIEW OF SYSTEMS:   Unobtainable due to critical illness  VITAL SIGNS: Temp:  [96 F (35.6 C)] 96 F (35.6 C) (10/06 0912) Pulse Rate:  [90-120] 90 (10/06 1045) Resp:  [26-30] 26 (10/06 1045) BP: (104-210)/(61-117) 110/61 (10/06 1045) SpO2:  [78 %-100 %] 100 % (10/06 1045) FiO2 (%):  [100 %] 100 % (10/06 1001) Weight:  [172 lb 14.4 oz (78.4 kg)] 172 lb 14.4 oz (78.4 kg) (10/06 0914) HEMODYNAMICS:   VENTILATOR SETTINGS: Vent Mode: AC FiO2 (%):  [100 %] 100 % Set Rate:  [20 bmp] 20 bmp Vt Set:  [500 mL] 500 mL PEEP:  [5 cmH20]  5 cmH20 INTAKE / OUTPUT: No intake or output data in the 24 hours ending 01/13/16 1110  PHYSICAL EXAMINATION:  GENERAL:critically ill appearing, +resp distress HEAD: Normocephalic, atraumatic.  EYES: Pupils equal, round, reactive to light.  No scleral icterus.  MOUTH: Moist mucosal membrane. NECK: Supple. No thyromegaly. No nodules. No JVD. c collar in place PULMONARY: Diffuse coarse rhonchi right sided +wheezes +rhonchi CARDIOVASCULAR: S1 and S2. Regular rate and rhythm. No murmurs, rubs, or gallops.  GASTROINTESTINAL: Soft, nontender, +distended. No masses. Positive bowel sounds. No hepatosplenomegaly.   MUSCULOSKELETAL: No swelling, clubbing, or edema.  NEUROLOGIC: GCS<8T SKIN:intact,warm,dry    CBC  Recent Labs Lab 01/13/16 0840  WBC 11.5*  HGB 13.4  HCT 39.5*  PLT 257   Coag's  Recent Labs Lab 01/13/16 0840  APTT 27  INR 1.11   BMET  Recent Labs Lab 01/13/16 0840  NA 139  K 3.6  CL 106  CO2 23  BUN 22*  CREATININE 1.53*  GLUCOSE 366*   Electrolytes  Recent Labs Lab 01/13/16 0840  CALCIUM 8.8*   Sepsis Markers  Recent Labs Lab 01/13/16 0840  LATICACIDVEN 5.3*   ABG  Recent Labs Lab 01/13/16 0946  PHART 7.16*  PCO2ART 57*  PO2ART 322*   Liver Enzymes  Recent Labs Lab 01/13/16 0840  AST 70*  ALT 46  ALKPHOS 106  BILITOT 0.6  ALBUMIN 3.8   Cardiac Enzymes  Recent Labs Lab 01/13/16 0840  TROPONINI 0.32*   Glucose No results for input(s): GLUCAP in the last 168 hours.  Imaging Ct Head Wo Contrast  Result Date: 01/13/2016 CLINICAL DATA:  Shortness of breath, worsening over the night. Altered mental status. EXAM: CT HEAD WITHOUT CONTRAST TECHNIQUE: Contiguous axial images were obtained from the base of the skull through the vertex without intravenous contrast. COMPARISON:  09/03/2012 FINDINGS: Brain: Mild atrophy. No acute intracranial abnormality. Specifically, no hemorrhage, hydrocephalus, mass lesion, acute infarction, or significant intracranial injury. Vascular: No hyperdense vessel or unexpected calcification. Skull: No acute calvarial abnormality. Sinuses/Orbits: Visualized paranasal sinuses and mastoids clear. Orbital soft tissues unremarkable. Other: None IMPRESSION: No acute intracranial abnormality. Electronically Signed   By: Charlett NoseKevin  Dover M.D.   On: 01/13/2016 09:07   Dg Chest Port 1 View  Result Date: 01/13/2016 CLINICAL DATA:  Shortness of breath.  Status post intubation. EXAM: PORTABLE CHEST 1 VIEW COMPARISON:  03/24/2015 FINDINGS: Endotracheal tube with the tip 4.5 cm above the carina. Bilateral perihilar  interstitial and alveolar airspace opacities with indistinctness of the central pulmonary vasculature. No significant pleural effusion. No pneumothorax. Stable cardiomediastinal silhouette. No acute osseous abnormality. IMPRESSION: 1. Endotracheal tube with the tip 4.5 cm above the carina. 2. Findings concerning for mild pulmonary edema. Electronically Signed   By: Elige KoHetal  Patel   On: 01/13/2016 09:21     ASSESSMENT / PLAN: 65 yo AAM with severe resp failure from acute COPD exacerbation with acute NSTEMI/STEMI Critically ill on vent  PULMONARY 1.Respiratory Failure -continue Full MV support -continue Bronchodilator Therapy -Wean Fio2 and PEEP as tolerated -Iv steroids aggressive BD therapy   CARDIOVASCULAR acute CHF acute STEMI -follow up cardiology recs -check ECHO -to cath lab this AM  RENAL ARF from ATN -follow chem 7 and UO Foley catheter  GASTROINTESTINAL Place OGT  HEMATOLOGIC Follow CBC  INFECTIOUS Start empiric abx and check sputum culture  ENDOCRINE - ICU hypoglycemic\Hyperglycemia protocol   NEUROLOGIC - intubated and sedated - minimal sedation to achieve a RASS goal: -1    I have personally obtained  a history, examined the patient, evaluated Pertinent laboratory and RadioGraphic/imaging results, and  formulated the assessment and plan   The Patient requires high complexity decision making for assessment and support, frequent evaluation and titration of therapies, application of advanced monitoring technologies and extensive interpretation of multiple databases. Critical Care Time devoted to patient care services described in this note is 55 minutes.   Overall, patient is critically ill, prognosis is guarded.  Patient with Multiorgan failure and at high risk for cardiac arrest and death.    Lucie Leather, M.D.  [facility]  Medical Director [facility] Medical Director [facility] Cardio-Pulmonary Department

## 2016-01-13 NOTE — ED Notes (Signed)
Propofol started at 30 ml/h per Dr. Shaune PollackLord. Unable to scan bottle due to pharmacist has chart locked.

## 2016-01-13 NOTE — Progress Notes (Signed)
Chaplain on call and his mentor encountered the family of three in the waiting room. The family requested a prayer for their loved one who was in the ICU. We went in the room and prayed with the patient. The patient was unresponsive.

## 2016-01-13 NOTE — ED Provider Notes (Signed)
[facility] Emergency Department Provider Note ____________________________________________   I have reviewed the triage vital signs and the triage nursing note.  HISTORY  Chief Complaint Shortness of Breath   Historian Little 5 caveat, patient altered mental status, unable to provide history Some history obtained from EMS on scene Some history obtained from wife who came later  HPI Damon [name] is a 65 y.o. male with reported history of medication noncompliance per his wife with breathing problems, inhaler use, unknown whether or not he is had any cardiac disease, but she reports no history of heart attack. Never been previously intubated. She states that he was complaining of some trouble breathing last night which was not all that unusual, but less so than worsen typical because he did not go into work overnight. This morning when she woke up he was already awake and having a lot more trouble breathing and was opening the refrigerator to get cold air and then went outside and became altered when she decided to call EMS. He was refusing to come for treatment last night.  She didn't think that he had fevers. She didn't think he been complaining of a headache or weakness or numbness.    Past Medical History:  Diagnosis Date  . Diabetes mellitus without complication (HCC)   . Diverticulitis   . Hypertension     There are no active problems to display for this patient.   Past Surgical History:  Procedure Laterality Date  . NECK SURGERY    . PERIPHERAL VASCULAR CATHETERIZATION Left 11/01/2015   Procedure: Lower Extremity Angiography;  Surgeon: Renford DillsGregory G Schnier, MD;  Location: [facility] INVASIVE CV LAB;  Service: Cardiovascular;  Laterality: Left;    Prior to Admission medications   Medication Sig Start Date End Date Taking? Authorizing Provider  amLODipine (NORVASC) 10 MG tablet Take 10 mg by mouth daily.    Historical Provider, MD  aspirin EC 81 MG tablet  Take 1 tablet (81 mg total) by mouth daily. 11/02/15   Renford DillsGregory G Schnier, MD  clopidogrel (PLAVIX) 75 MG tablet Take 1 tablet (75 mg total) by mouth daily. 11/02/15   Renford DillsGregory G Schnier, MD  enalapril (VASOTEC) 20 MG tablet Take 40 mg by mouth daily.    Historical Provider, MD  gabapentin (NEURONTIN) 300 MG capsule Take 300 mg by mouth 2 (two) times daily.    Historical Provider, MD  hydrochlorothiazide (HYDRODIURIL) 25 MG tablet Take 25 mg by mouth daily.    Historical Provider, MD  Insulin Glargine (LANTUS SOLOSTAR) 100 UNIT/ML Solostar Pen Inject 30 Units into the skin daily.     Historical Provider, MD  metoprolol (LOPRESSOR) 50 MG tablet Take 50 mg by mouth 2 (two) times daily.    Historical Provider, MD  Multiple Vitamin (MULTIVITAMIN WITH MINERALS) TABS tablet Take 1 tablet by mouth daily.    Historical Provider, MD  vitamin B-12 (CYANOCOBALAMIN) 1000 MCG tablet Take 1,000 mcg by mouth daily.    Historical Provider, MD  vitamin E 100 UNIT capsule Take 100 Units by mouth daily.    Historical Provider, MD    No Known Allergies  No family history on file.  Social History Social History  Substance Use Topics  . Smoking status: Current Every Day Smoker  . Smokeless tobacco: Current User  . Alcohol use No    Review of Systems  Unable to obtain due to altered mental status and critical illness/respiratory failure ____________________________________________   PHYSICAL EXAM:  VITAL SIGNS: ED Triage Vitals  Enc Vitals Group     BP      Pulse      Resp      Temp      Temp src      SpO2      Weight      Height      Head Circumference      Peak Flow      Pain Score      Pain Loc      Pain Edu?      Excl. in GC?      Constitutional: Diaphoretic, unable to follow commands, respiratory distress HEENT   Head: Normocephalic and atraumatic.      Eyes: Conjunctivae are normal. PERRL. roving eye movements       Ears:         Nose: No congestion/rhinnorhea.    Mouth/Throat: Mucous membranes are moist.   Neck: No stridor. Cardiovascular/Chest: Tachycardic, regular rhythm.  No murmurs, rubs, or gallops. Respiratory: Tachypnea and retractions. Tight breath sounds throughout, left somewhat clearer than the right. Gastrointestinal: Soft. No distention.  Genitourinary/rectal:Deferred Musculoskeletal: The patient is moving 4 extremities.   Neurologic:  No speech. Some localizing to pain.  No clear facial droop.  No verbal.  Moving 4 extremities, no clear focal weakness - unable to assess numbness. Skin: Cool and diaphoretic.    ____________________________________________  LABS (pertinent positives/negatives)  Labs Reviewed  CBC WITH DIFFERENTIAL/PLATELET - Abnormal; Notable for the following:       Result Value   WBC 11.5 (*)    RBC 4.05 (*)    HCT 39.5 (*)    Neutro Abs 6.8 (*)    Lymphs Abs 3.9 (*)    All other components within normal limits  COMPREHENSIVE METABOLIC PANEL - Abnormal; Notable for the following:    Glucose, Bld 366 (*)    BUN 22 (*)    Creatinine, Ser 1.53 (*)    Calcium 8.8 (*)    AST 70 (*)    GFR calc non Af Amer 47 (*)    GFR calc Af Amer 54 (*)    All other components within normal limits  TROPONIN I - Abnormal; Notable for the following:    Troponin I 0.32 (*)    All other components within normal limits  BRAIN NATRIURETIC PEPTIDE - Abnormal; Notable for the following:    B Natriuretic Peptide 2,233.0 (*)    All other components within normal limits  CULTURE, BLOOD (ROUTINE X 2)  CULTURE, BLOOD (ROUTINE X 2)  ETHANOL  LACTIC ACID, PLASMA  LACTIC ACID, PLASMA  URINALYSIS COMPLETEWITH MICROSCOPIC ([facility] ONLY)  URINE DRUG SCREEN, QUALITATIVE ([facility] ONLY)  BLOOD GAS, ARTERIAL  APTT  PROTIME-INR  CBG MONITORING, ED    ____________________________________________    EKG I, Governor Rooks, MD, the attending physician have personally viewed and interpreted all ECGs.  EKG #1 128 bpm. Sinus tachycardia.  Left bundle branch block. Some ST segment elevation in V1 through V3/v4 . On prior EKG there was a hemifascicular block.  EKG #215 bpm. Sinus tachycardia. Left bundle branch block, similar to prior earlier today. ____________________________________________  RADIOLOGY All Xrays were viewed by me. Imaging interpreted by Radiologist.  Chest x-ray portable:  IMPRESSION: 1. Endotracheal tube with the tip 4.5 cm above the carina. 2. Findings concerning for mild pulmonary edema.  CT head without contrast:  IMPRESSION: No acute intracranial abnormality. __________________________________________  PROCEDURES  Procedure(s) performed: INTUBATION Performed by: Governor Rooks  Required items: required blood products, implants,  devices, and special equipment available Patient identity confirmed: provided demographic data and hospital-assigned identification number Time out: Immediately prior to procedure a "time out" was called to verify the correct patient, procedure, equipment, support staff and site/side marked as required.  Indications: hypoxic respiratory failure  Intubation method: Glidescope Laryngoscopy   Preoxygenation: BVM up to 94%  Sedatives: Etomidate Paralytic: Succinylcholine  Tube Size: 8.0 cuffed  Post-procedure assessment: chest rise and ETCO2 monitor Breath sounds: equal and absent over the epigastrium Tube secured with: ETT holder Chest x-ray interpreted by radiologist and me.  Chest x-ray findings: endotracheal tube 4.5cm above carina -- I asked rt to advance.  Patient tolerated the procedure well with no immediate complications.     Critical Care performed: CRITICAL CARE Performed by: Governor Rooks   Total critical care time: 90 minutes  Critical care time was exclusive of separately billable procedures and treating other patients.  Critical care was necessary to treat or prevent imminent or life-threatening deterioration.  Critical care was time  spent personally by me on the following activities: development of treatment plan with patient and/or surrogate as well as nursing, discussions with consultants, evaluation of patient's response to treatment, examination of patient, obtaining history from patient or surrogate, ordering and performing treatments and interventions, ordering and review of laboratory studies, ordering and review of radiographic studies, pulse oximetry and re-evaluation of patient's condition.   ____________________________________________   ED COURSE / ASSESSMENT AND PLAN  Pertinent labs & imaging results that were available during my care of the patient were reviewed by me and considered in my medical decision making (see chart for details).   This patient came in by EMS still hypoxic and combative with an O2 sat in the 70s with a good waveform confirmed here on the nonrebreather. Preparations were made to intubate.  This question in my mind about whether or not a primary intracranial issue could've been a source of his altered mental status, and looking in his neurologic exam, he was moving all 4 extremities, no obvious focal weakness.  With intubation, patient O2 sat up to 100%. Blood pressure elevated in the 199/100 range. CT head was obtained and I looked at the images in radiology, no obvious mass effect, mass, or bleed.  EKG showed slight ST segment elevation associated with left bundle branch block which is new when compared to old EKG, but prior EKGs showed hemiblock and LVH. I spoke with Dr. Kirke Corin about complicated picture, but I am most suspicious about primary pulmonary etiology to his hypoxic respiratory failure.  Chest x-ray showed no obvious infiltrate, but with hypoxic respiratory failure, I did send blood cultures.  At this point it seems like his hypoxic respiratory failure is due to COPD exacerbation, and patient was given Solu-Medrol, is received 2 DuoNeb's. Given his EKG findings, an elevated  troponin, patient was given aspirin rectally as well as heparin. He is on Plavix, but it sounds like he is very noncompliant so I suspect he may not even be anticoagulated.  I spoke with Dr. Belia Heman, intensivist who came down at the bedside for evaluation of the patient and this patient will be admitted to ICU. We discussed covering for community acquired pneumonia with Rocephin and azithromycin.  In terms of fluid resuscitation, he's been hypertensive here, and I certainly don't want to push it any worse. He has evidence of mild edema on his chest x-ray, and I do think he is at risk for congestive heart failure. Concerning factor, and so I don't  want to excessively hydrate given that infection I think is less likely but worse being covered with antibiotics at this point.    CONSULTATIONS:   I spoke by phone with Dr. Kirke Corin, cardiology regarding new left bundle branch block, and given the situation where it's not quite clear that this is a primary heart blockage issue - more likely pulmonary etiology of his acute hypoxia with cardiac strain. We did review troponin 0.3 and repeat EKG unchanged, and Dr. Kirke Corin recommends heparin now and ICU admission with repeat troponin, cath lab immediately.   Around 10:30 I spoke with Dr. Kirke Corin who saw this patient after stat bedside echo, showing abnormal anterior wall motion, he is considering taking the patient to Cath Lab.   Patient / Family / Caregiver informed of clinical course, medical decision-making process, and agree with plan.   ___________________________________________   FINAL CLINICAL IMPRESSION(S) / ED DIAGNOSES   Final diagnoses:  Acute respiratory failure with hypoxia (HCC)  Troponin I above reference range              Note: This dictation was prepared with Dragon dictation. Any transcriptional errors that result from this process are unintentional    Governor Rooks, MD 01/13/16 [phone]

## 2016-01-13 NOTE — Progress Notes (Signed)
*  PRELIMINARY RESULTS* Echocardiogram 2D Echocardiogram has been performed.  Cristela BlueHege, Tilia Faso 01/13/2016, 10:46 AM

## 2016-01-13 NOTE — ED Notes (Signed)
Pt arrived via [facility] for SOB.  Arrived on bipap. EMS was called by wife. Wife reports that her husband has SOB throughout the night.  On arrival oxygen sats in upper 70's. EMS reports sats into the upper 60's.  Pt arrives combative, confused. Pt not speaking at this time.

## 2016-01-13 NOTE — Consult Note (Signed)
Cardiology Consult    Patient ID: Damon [name] MRN: [id number], DOB/AGE: July 14, 1950   Admit date: 01/13/2016   Primary Physician: Damon MorganAYCOCK, NGWE [name], [name] Primary Cardiologist: Damon [name]   Patient Profile    65 y/o ? with [name] h/o PAD, DM, HTN and tobacco abuse, who presented to the ED this AM with progressive dyspnea requiring intubation and was found to have [name] Damon LBBB in the setting of marked HTN.  Past Medical History    Past Medical History:  Diagnosis Date  . CKD (chronic kidney disease), stage III   . Diabetes mellitus without complication (HCC)   . Diverticulitis   . Essential hypertension   . PAD (peripheral artery disease) (HCC)    [name] 10/2015 s/p PTA and stenting of the RCIA, LCIA, and PTA of the LEIA Environmental manager([name]).  . Tobacco abuse     Past Surgical History:  Procedure Laterality Date  . NECK SURGERY    . PERIPHERAL VASCULAR CATHETERIZATION Left 11/01/2015   Procedure: Lower Extremity Angiography;  Surgeon: Damon DillsGregory G Schnier, [name];  Location: [facility] INVASIVE CV LAB;  Service: Cardiovascular;  Laterality: Left;     Allergies  No Known Allergies  History of Present Illness    65 y/o ? without [name] prior cardiac hx.  He does have [name] h/o claudication and PAD s/p bilateral iliac stenting in 10/2015, HTN, DM, and tobacco abuse.  He is currently intubated and sedated and is not able to provide [name] history.  I spoke with his wife @ length.  Prior to bed on the evening of 10/5, Damon [name] told his wife that he wasn't feeling well, though he did not have any specific complaints.  She noted that he was restless during the night and early this AM he got up to watch TV and used his inhaler.  He told her @ that point that he was feeling [name] little better.  He went back to bed but then got up shortly thereafter and per wife, she heard [name] loud sound coming from the kitchen.  He had been standing in front of the refrigerator with the door open and his head inside in an attempt to get more air.   He was holding the handle of the refrigerator door when the handle broke off and he fell forward, striking his head.  When she attended to him, he appeared disheveled and diaphoretic.  He went outside to 'get more air' @ which point his wife called EMS.  Upon EMS arrival, he was hypoxic w/ O2 sats in the upper 60's per reports.  BiPap was placed and [facility].  Upon arrival, he was confused and combative.  O2 sats in the 70's.  BP 210/117.  BNP 2233, trop I 0.32.  Pt was intubated and sedated.  CXR shows mild chf.  Without intervention, other than sedation, BP has come down to 100 systolic. Bedside echo was performed and showed an anterior wall motion abnormality.  Home Medications    Prior to Admission medications   Medication Sig Start Date End Date Taking? Authorizing Provider  amLODipine (NORVASC) 10 MG tablet Take 10 mg by mouth daily.    Historical Provider, [name]  aspirin EC 81 MG tablet Take 1 tablet (81 mg total) by mouth daily. 11/02/15   Damon DillsGregory G Schnier, [name]  clopidogrel (PLAVIX) 75 MG tablet Take 1 tablet (75 mg total) by mouth daily. 11/02/15   Damon DillsGregory G Schnier, [name]  enalapril (VASOTEC) 20 MG tablet Take 40 mg by mouth daily.    Historical Provider, [name]  gabapentin (NEURONTIN) 300 MG capsule Take 300 mg by mouth 2 (two) times daily.    Historical Provider, [name]  hydrochlorothiazide (HYDRODIURIL) 25 MG tablet Take 25 mg by mouth daily.    Historical Provider, [name]  Insulin Glargine (LANTUS SOLOSTAR) 100 UNIT/ML Solostar Pen Inject 30 Units into the skin daily.     Historical Provider, [name]  metoprolol (LOPRESSOR) 50 MG tablet Take 50 mg by mouth 2 (two) times daily.    Historical Provider, [name]  Multiple Vitamin (MULTIVITAMIN WITH MINERALS) TABS tablet Take 1 tablet by mouth daily.    Historical Provider, [name]  vitamin B-12 (CYANOCOBALAMIN) 1000 MCG tablet Take 1,000 mcg by mouth daily.    Historical Provider, [name]  vitamin E 100 UNIT capsule Take 100 Units by mouth daily.    Historical  Provider, [name]    Family History    Pt intubated and sedated and unable to provide history.  Social History   Pt intubated and sedated and unable to provide history.  History below reflects what has been previously recorded in [facility].  Social History   Social History  . Marital status: Married    Spouse name: N/[name]  . Number of children: N/[name]  . Years of education: N/[name]   Occupational History  . Not on file.   Social History Main Topics  . Smoking status: Current Every Day Smoker  . Smokeless tobacco: Current User  . Alcohol use No  . Drug use: No  . Sexual activity: Not on file   Other Topics Concern  . Not on file   Social History Narrative   Lives in [place] with his wife.  Does not routinely exercise.     Review of Systems    Pt intubated and sedated and unable to provide ROS but per wife, he c/o general malaise beginning on the evening of 10/5 with development of progressive dyspnea overnight.  Physical Exam    Blood pressure 104/62, pulse (!) 120, temperature (!) 96 F (35.6 C), temperature source Axillary, resp. rate (!) 30, weight 172 lb 14.4 oz (78.4 kg), SpO2 100 %.  General: Intubated, sedated Psych: Intubated, sedated Neuro: Intubated, sedated HEENT: Normal  Neck: Supple without bruits or JVD. Lungs:  Resp regular and unlabored, scattered rhonchi with bibasilar crackles. Heart: RRR, tachy, no s3, s4, or murmurs. Abdomen: Soft, non-tender, non-distended, BS + x 4.  Extremities: No clubbing, cyanosis or edema. DP/PT/Radials 1+ and equal bilaterally.  Labs     Recent Labs  01/13/16 0840  TROPONINI 0.32*   Lab Results  Component Value Date   WBC 11.5 (H) 01/13/2016   HGB 13.4 01/13/2016   HCT 39.5 (L) 01/13/2016   MCV 97.5 01/13/2016   PLT 257 01/13/2016     Recent Labs Lab 01/13/16 0840  NA 139  K 3.6  CL 106  CO2 23  BUN 22*  CREATININE 1.53*  CALCIUM 8.8*  PROT 7.1  BILITOT 0.6  ALKPHOS 106  ALT 46  AST 70*  GLUCOSE 366*       Radiology Studies    Ct Head Wo Contrast  Result Date: 01/13/2016 CLINICAL DATA:  Shortness of breath, worsening over the night. Altered mental status. EXAM: CT HEAD WITHOUT CONTRAST TECHNIQUE: Contiguous axial images were obtained from the base of the skull through the vertex without intravenous contrast. COMPARISON:  09/03/2012 FINDINGS: Brain: Mild atrophy. No acute intracranial abnormality. Specifically, no  hemorrhage, hydrocephalus, mass lesion, acute infarction, or significant intracranial injury. Vascular: No hyperdense vessel or unexpected calcification. Skull: No acute calvarial abnormality. Sinuses/Orbits: Visualized paranasal sinuses and mastoids clear. Orbital soft tissues unremarkable. Other: None IMPRESSION: No acute intracranial abnormality. Electronically Signed   By: Charlett Nose DamonD.   On: 01/13/2016 09:07   Dg Chest Port 1 View  Result Date: 01/13/2016 CLINICAL DATA:  Shortness of breath.  Status post intubation. EXAM: PORTABLE CHEST 1 VIEW COMPARISON:  03/24/2015 FINDINGS: Endotracheal tube with the tip 4.5 cm above the carina. Bilateral perihilar interstitial and alveolar airspace opacities with indistinctness of the central pulmonary vasculature. No significant pleural effusion. No pneumothorax. Stable cardiomediastinal silhouette. No acute osseous abnormality. IMPRESSION: 1. Endotracheal tube with the tip 4.5 cm above the carina. 2. Findings concerning for mild pulmonary edema. Electronically Signed   By: Elige Ko   On: 01/13/2016 09:21    ECG & Cardiac Imaging    Sinus tach, 115, LBBB - Damon since 05/2014  Assessment & Plan    1.  Acute respiratory failure/Acute congestive heart failure:  Pt presented to the [facility] ED this AM after developing progressive dyspnea and what sounds like orthopnea overnight.  He was hypoxic with sats in the 60's upon EMS arrival and sats in the 70's on bipap in the ED.  He has been intubated and sedated.  Though he was markedly hypertensive upon  arrival, BP dropped to low 100's following sedation.  CXR does show chf and BNP is elevated @ 2233.  Trop 0.32.  Bedside echo has shown an anterior wma.  In that setting, along with Damon LBBB, we will plan on urgent diagnostic catheterization to assess coronary anatomy.  We have spoken to the patient's wife and she understands that risks include but are not limited to stroke (1 in 1000), death (1 in 1000), kidney failure [usually temporary] (1 in 500), bleeding (1 in 200), allergic reaction [possibly serious] (1 in 200), and agrees to have [place] proceed.  Vent mgmt per CCM.  Will assess LVEDP in lab.  Presume he will require diuresis.  2.  Hypertensive Emergency:  In setting of above.  Suspect HTN 2/2 agitation and dyspnea more so than the other way around as BP has dropped to low 100's with sedation only.  He is on multiple BP meds @ home.  Follow post-cath.  3.  Elevated troponin/ACS/Damon LBBB:  See above.  Plan for urgent cath this morning.  Heparin bolus has been given.  Will need ASA PR.  He is also on plavix,  blocker, and acei @ home.  4.  DM II:  Glucose elevated this AM.  On lantus @ home.  Per IM.  5.  CKD III:  Creat stable @ 1.53.  Follow post-cath.  In setting of chf, will not be able to aggressively hydrate pre-cath.  6.  Tob Abuse:  Will require formal cessation counseling.  7.  PAD:  Per wife, claudication resolved following PTA of RCIA, LCIA, and LEIA in July.  Cont asa, plavix.  Needs to be on statin in setting of PAD and DM.  Signed, Nicolasa Ducking, NP 01/13/2016, 10:14 AM

## 2016-01-13 NOTE — ED Triage Notes (Signed)
Pt arrives via [facility] for SOB.  Arrived on bipap

## 2016-01-13 NOTE — Progress Notes (Signed)
Dr. Myles LippsKrenshaw and Seward GraterMaggie, NP notified of patient's troponin 3.28. No new orders.

## 2016-01-14 ENCOUNTER — Encounter: Payer: Self-pay | Admitting: *Deleted

## 2016-01-14 ENCOUNTER — Inpatient Hospital Stay: Payer: Commercial Managed Care - PPO

## 2016-01-14 DIAGNOSIS — I214 Non-ST elevation (NSTEMI) myocardial infarction: Secondary | ICD-10-CM

## 2016-01-14 LAB — HEPATIC FUNCTION PANEL
ALT: 39 U/L (ref 17–63)
AST: 44 U/L — AB (ref 15–41)
Albumin: 3.3 g/dL — ABNORMAL LOW (ref 3.5–5.0)
Alkaline Phosphatase: 84 U/L (ref 38–126)
BILIRUBIN TOTAL: 0.2 mg/dL — AB (ref 0.3–1.2)
Total Protein: 6.1 g/dL — ABNORMAL LOW (ref 6.5–8.1)

## 2016-01-14 LAB — GLUCOSE, CAPILLARY
GLUCOSE-CAPILLARY: 195 mg/dL — AB (ref 65–99)
GLUCOSE-CAPILLARY: 180 mg/dL — AB (ref 65–99)
GLUCOSE-CAPILLARY: 60 mg/dL — AB (ref 65–99)
GLUCOSE-CAPILLARY: 65 mg/dL (ref 65–99)
GLUCOSE-CAPILLARY: 188 mg/dL — AB (ref 65–99)
GLUCOSE-CAPILLARY: 93 mg/dL (ref 65–99)
Glucose-Capillary: 139 mg/dL — ABNORMAL HIGH (ref 65–99)
Glucose-Capillary: 144 mg/dL — ABNORMAL HIGH (ref 65–99)
Glucose-Capillary: 145 mg/dL — ABNORMAL HIGH (ref 65–99)
Glucose-Capillary: 292 mg/dL — ABNORMAL HIGH (ref 65–99)
Glucose-Capillary: 64 mg/dL — ABNORMAL LOW (ref 65–99)

## 2016-01-14 LAB — BASIC METABOLIC PANEL
Anion gap: 5 (ref 5–15)
BUN: 36 mg/dL — AB (ref 6–20)
CHLORIDE: 110 mmol/L (ref 101–111)
CO2: 21 mmol/L — AB (ref 22–32)
CREATININE: 1.85 mg/dL — AB (ref 0.61–1.24)
Calcium: 8.5 mg/dL — ABNORMAL LOW (ref 8.9–10.3)
GFR calc Af Amer: 43 mL/min — ABNORMAL LOW (ref >60)
GFR calc non Af Amer: 37 mL/min — ABNORMAL LOW (ref >60)
Glucose, Bld: 134 mg/dL — ABNORMAL HIGH (ref 65–99)
Potassium: 4.4 mmol/L (ref 3.5–5.1)
Sodium: 136 mmol/L (ref 135–145)

## 2016-01-14 LAB — CBC
HCT: 33.6 % — ABNORMAL LOW (ref 40.0–52.0)
HEMOGLOBIN: 11.7 g/dL — AB (ref 13.0–18.0)
MCH: 32.8 pg (ref 26.0–34.0)
MCHC: 34.8 g/dL (ref 32.0–36.0)
MCV: 94.1 fL (ref 80.0–100.0)
Platelets: 233 10*3/uL (ref 150–440)
RBC: 3.57 MIL/uL — AB (ref 4.40–5.90)
RDW: 13.9 % (ref 11.5–14.5)
WBC: 16.5 10*3/uL — AB (ref 3.8–10.6)

## 2016-01-14 LAB — BLOOD GAS, ARTERIAL
ACID-BASE DEFICIT: 3.5 mmol/L — AB (ref 0.0–2.0)
Bicarbonate: 20.4 mmol/L (ref 20.0–28.0)
FIO2: 0.3
O2 SAT: 98.1 %
PCO2 ART: 33 mmHg (ref 32.0–48.0)
Patient temperature: 37
pH, Arterial: 7.4 (ref 7.350–7.450)
pO2, Arterial: 106 mmHg (ref 83.0–108.0)

## 2016-01-14 LAB — MAGNESIUM: Magnesium: 1.8 mg/dL (ref 1.7–2.4)

## 2016-01-14 LAB — URINE CULTURE
CULTURE: NO GROWTH
Special Requests: NORMAL

## 2016-01-14 LAB — LACTIC ACID, PLASMA: Lactic Acid, Venous: 0.9 mmol/L (ref 0.5–1.9)

## 2016-01-14 LAB — PHOSPHORUS: PHOSPHORUS: 3 mg/dL (ref 2.5–4.6)

## 2016-01-14 LAB — TROPONIN I: Troponin I: 4.35 ng/mL (ref <0.03)

## 2016-01-14 MED ORDER — FUROSEMIDE 10 MG/ML IJ SOLN
80.0000 mg | Freq: Once | INTRAMUSCULAR | Status: AC
  Administered 2016-01-14: 80 mg via INTRAVENOUS
  Filled 2016-01-14: qty 8

## 2016-01-14 MED ORDER — PNEUMOCOCCAL VAC POLYVALENT 25 MCG/0.5ML IJ INJ
0.5000 mL | INJECTION | INTRAMUSCULAR | Status: AC
  Administered 2016-01-15: 0.5 mL via INTRAMUSCULAR
  Filled 2016-01-14: qty 0.5

## 2016-01-14 MED ORDER — DEXTROSE 50 % IV SOLN
INTRAVENOUS | Status: AC
  Filled 2016-01-14: qty 50

## 2016-01-14 MED ORDER — HEPARIN SODIUM (PORCINE) 5000 UNIT/ML IJ SOLN: 5000.0000 [IU] | Freq: Three times a day (TID) | INTRAMUSCULAR | Status: DC

## 2016-01-14 MED ORDER — NICOTINE 21 MG/24HR TD PT24
21.0000 mg | MEDICATED_PATCH | Freq: Every day | TRANSDERMAL | Status: DC
  Administered 2016-01-14 – 2016-01-17 (×4): 21 mg via TRANSDERMAL
  Filled 2016-01-14 (×4): qty 1

## 2016-01-14 MED ORDER — DEXMEDETOMIDINE HCL IN NACL 400 MCG/100ML IV SOLN
INTRAVENOUS | Status: AC
  Filled 2016-01-14: qty 100

## 2016-01-14 MED ORDER — DEXTROSE 50 % IV SOLN: 1.0000 | Freq: Once | INTRAVENOUS | Status: DC

## 2016-01-14 MED ORDER — DIPHENHYDRAMINE HCL 25 MG PO CAPS
25.0000 mg | ORAL_CAPSULE | Freq: Every evening | ORAL | Status: DC | PRN
Start: 2016-01-14 — End: 2016-01-17
  Administered 2016-01-14 – 2016-01-15 (×2): 25 mg via ORAL
  Filled 2016-01-14 (×2): qty 1

## 2016-01-14 MED ORDER — LISINOPRIL 5 MG PO TABS
5.0000 mg | ORAL_TABLET | Freq: Every day | ORAL | Status: DC
  Administered 2016-01-14 – 2016-01-15 (×2): 5 mg via ORAL
  Filled 2016-01-14 (×2): qty 1

## 2016-01-14 MED ORDER — INFLUENZA VAC SPLIT QUAD 0.5 ML IM SUSY
0.5000 mL | PREFILLED_SYRINGE | INTRAMUSCULAR | Status: AC
  Administered 2016-01-15: 0.5 mL via INTRAMUSCULAR
  Filled 2016-01-14 (×2): qty 0.5

## 2016-01-14 MED ORDER — ACETAMINOPHEN 325 MG PO TABS
650.0000 mg | ORAL_TABLET | Freq: Four times a day (QID) | ORAL | Status: DC | PRN
Start: 2016-01-14 — End: 2016-01-17
  Administered 2016-01-14: 650 mg via ORAL
  Filled 2016-01-14: qty 2

## 2016-01-14 MED ORDER — MORPHINE SULFATE (PF) 2 MG/ML IV SOLN: 2.0000 mg | INTRAVENOUS | Status: DC | PRN

## 2016-01-14 MED ORDER — CARVEDILOL 6.25 MG PO TABS
6.2500 mg | ORAL_TABLET | Freq: Two times a day (BID) | ORAL | Status: DC
  Administered 2016-01-14 – 2016-01-15 (×3): 6.25 mg via ORAL
  Filled 2016-01-14 (×3): qty 1

## 2016-01-14 MED ORDER — MORPHINE SULFATE (PF) 2 MG/ML IV SOLN
INTRAVENOUS | Status: AC
  Administered 2016-01-14: 2 mg via INTRAVENOUS
  Filled 2016-01-14: qty 1

## 2016-01-14 MED ORDER — DEXMEDETOMIDINE HCL IN NACL 400 MCG/100ML IV SOLN
0.4000 ug/kg/h | INTRAVENOUS | Status: DC
  Administered 2016-01-14: 1 ug/kg/h via INTRAVENOUS

## 2016-01-14 MED ORDER — FENTANYL CITRATE (PF) 100 MCG/2ML IJ SOLN
50.0000 ug | Freq: Once | INTRAMUSCULAR | Status: AC
  Administered 2016-01-14: 50 ug via INTRAVENOUS
  Filled 2016-01-14: qty 2

## 2016-01-14 NOTE — Progress Notes (Signed)
This was a follow-up visit with the patient whom the chaplain had visited with other chaplains yesterday in ICU and was able to talk to his wife and his sister. On this visit the chaplain saw the patient who was doing very well and was able talk to him, his wife, sister, and 3 other family friend who were present. Chaplain provided spiritual support and prayers for the patient and his family.    01/14/16 1100  Clinical Encounter Type  Visited With Patient  Visit Type Initial;Follow-up;Spiritual support  Referral From Other (Comment)  Spiritual Encounters  Spiritual Needs Prayer

## 2016-01-14 NOTE — Progress Notes (Signed)
PULMONARY / CRITICAL CARE MEDICINE   Name: Damon [name] MRN: [id number] DOB: [birth year]    ADMISSION DATE:  01/13/2016   CHIEF COMPLAINT:  resp failure  HISTORY OF PRESENT ILLNESS:  65 yo AAM seen in ER today for acute resp failure with reported history of medication noncompliance per his wife with breathing problems, inhaler use, unknown whether or not he is had any cardiac disease,  - reports no history of heart attack.  -Never been previously intubated. - She states that he was complaining of some trouble breathing last night  - he did not go into work overnight. This morning when she woke up he was already awake and having a lot more trouble breathing and was opening the refrigerator to get cold air and then went outside and became altered when she decided to call EMS.  Patient had low o2 sats 50%, patient was emergently intubated, placed on full vent support   SUBJECTIVE   10/6 s/p cath LAD lesion Remains intubated,sedated Will attempt SAT/SBT today  REVIEW OF SYSTEMS:   Unobtainable due to critical illness  VITAL SIGNS: Temp:  [96 F (35.6 C)-99.5 F (37.5 C)] 99.5 F (37.5 C) (10/07 0400) Pulse Rate:  [87-120] 88 (10/07 0600) Resp:  [13-30] 26 (10/07 0600) BP: (93-210)/(57-117) 93/57 (10/07 0600) SpO2:  [78 %-100 %] 99 % (10/07 0600) FiO2 (%):  [30 %-100 %] 30 % (10/07 0400) Weight:  [172 lb 14.4 oz (78.4 kg)-184 lb 1.4 oz (83.5 kg)] 184 lb 1.4 oz (83.5 kg) (10/07 0602) HEMODYNAMICS:   VENTILATOR SETTINGS: Vent Mode: PRVC FiO2 (%):  [30 %-100 %] 30 % Set Rate:  [20 bmp-26 bmp] 26 bmp Vt Set:  [500 mL] 500 mL PEEP:  [5 cmH20] 5 cmH20 INTAKE / OUTPUT:  Intake/Output Summary (Last 24 hours) at 01/14/16 0732 Last data filed at 01/14/16 0539  Gross per 24 hour  Intake          1146.41 ml  Output             1080 ml  Net            66.41 ml    PHYSICAL EXAMINATION:  GENERAL:critically ill appearing, +resp distress HEAD: Normocephalic, atraumatic.  EYES:  Pupils equal, round, reactive to light.  No scleral icterus.  MOUTH: Moist mucosal membrane. NECK: Supple. No thyromegaly. No nodules. No JVD. c collar in place PULMONARY: Diffuse coarse rhonchi right sided +wheezes +rhonchi CARDIOVASCULAR: S1 and S2. Regular rate and rhythm. No murmurs, rubs, or gallops.  GASTROINTESTINAL: Soft, nontender, +distended. No masses. Positive bowel sounds. No hepatosplenomegaly.  MUSCULOSKELETAL: No swelling, clubbing, or edema.  NEUROLOGIC: GCS<8T SKIN:intact,warm,dry    CBC  Recent Labs Lab 01/13/16 1426 01/13/16 2132 01/14/16 0424  WBC 16.3* 12.4* 16.5*  HGB 13.1 12.2* 11.7*  HCT 39.5* 35.4* 33.6*  PLT 252 230 233   Coag's  Recent Labs Lab 01/13/16 0840 01/13/16 1426  APTT 27  --   INR 1.11 1.19   BMET  Recent Labs Lab 01/13/16 0840 01/13/16 1426 01/14/16 0424  NA 139 136 136  K 3.6 3.6 4.4  CL 106 105 110  CO2 23 21* 21*  BUN 22* 25* 36*  CREATININE 1.53* 1.52* 1.85*  GLUCOSE 366* 301* 134*   Electrolytes  Recent Labs Lab 01/13/16 0840 01/13/16 1426 01/13/16 2132 01/14/16 0424  CALCIUM 8.8* 8.8*  --  8.5*  MG  --  2.0 1.7 1.8  PHOS  --  4.0 2.9 3.0   Sepsis  Markers  Recent Labs Lab 01/13/16 0840 01/13/16 1452  LATICACIDVEN 5.3* 3.7*   ABG  Recent Labs Lab 01/13/16 0946 01/14/16 0500  PHART 7.16* 7.40  PCO2ART 57* 33  PO2ART 322* 106   Liver Enzymes  Recent Labs Lab 01/13/16 0840 01/14/16 0424  AST 70* 44*  ALT 46 39  ALKPHOS 106 84  BILITOT 0.6 0.2*  ALBUMIN 3.8 3.3*   Cardiac Enzymes  Recent Labs Lab 01/13/16 0840 01/13/16 2132 01/14/16 0424  TROPONINI 0.32* 3.28* 4.35*   Glucose  Recent Labs Lab 01/13/16 1723 01/13/16 1938 01/13/16 2325 01/14/16 0052 01/14/16 0232 01/14/16 0344  GLUCAP 382* 379* 279* 292* 195* 180*    Imaging Dg Abd 1 View  Result Date: 01/13/2016 CLINICAL DATA:  Assess position of the nasogastric tube EXAM: ABDOMEN - 1 VIEW COMPARISON:  CT scan of  the chest of today's date FINDINGS: The nasogastric tube tip projects in the region of gastric fundus with the proximal port in the cardia. The bowel gas pattern is within the limits of normal. External pacemaker defibrillator pads are present over the cardiac silhouette. There is patchy increased density in the right infrahilar region which may reflect atelectasis or pneumonia. The endotracheal tube tip lies approximately 3.2 cm above the carina. There is calcification in the wall of the aortic arch. There is contrast within the renal collecting systems from CT scan of the chest earlier today. The IMPRESSION: The nasogastric tube tip lies in the gastric fundus with the proximal port in the cardia. Advancement by 5-10 cc would be useful. Thoracic aortic atherosclerosis. Electronically Signed   By: David  SwazilandJordan M.D.   On: 01/13/2016 14:40   Ct Head Wo Contrast  Result Date: 01/13/2016 CLINICAL DATA:  Shortness of breath, worsening over the night. Altered mental status. EXAM: CT HEAD WITHOUT CONTRAST TECHNIQUE: Contiguous axial images were obtained from the base of the skull through the vertex without intravenous contrast. COMPARISON:  09/03/2012 FINDINGS: Brain: Mild atrophy. No acute intracranial abnormality. Specifically, no hemorrhage, hydrocephalus, mass lesion, acute infarction, or significant intracranial injury. Vascular: No hyperdense vessel or unexpected calcification. Skull: No acute calvarial abnormality. Sinuses/Orbits: Visualized paranasal sinuses and mastoids clear. Orbital soft tissues unremarkable. Other: None IMPRESSION: No acute intracranial abnormality. Electronically Signed   By: Charlett NoseKevin  Dover M.D.   On: 01/13/2016 09:07   Dg Chest Port 1 View  Result Date: 01/14/2016 CLINICAL DATA:  Acute respiratory failure. Followup ventilator support. EXAM: PORTABLE CHEST 1 VIEW COMPARISON:  01/13/2016 FINDINGS: Endotracheal tube has its tip 2 cm above the carina. Nasogastric tube enters stomach.  Infiltrate/ edema in both lower lobes persists but is slightly improved. No worsening or new finding. IMPRESSION: Slight radiographic improvement in infiltrate/ edema in both lower lobes. Electronically Signed   By: Paulina FusiMark  Shogry M.D.   On: 01/14/2016 07:03   Dg Chest Port 1 View  Result Date: 01/13/2016 CLINICAL DATA:  Shortness of breath.  Status post intubation. EXAM: PORTABLE CHEST 1 VIEW COMPARISON:  03/24/2015 FINDINGS: Endotracheal tube with the tip 4.5 cm above the carina. Bilateral perihilar interstitial and alveolar airspace opacities with indistinctness of the central pulmonary vasculature. No significant pleural effusion. No pneumothorax. Stable cardiomediastinal silhouette. No acute osseous abnormality. IMPRESSION: 1. Endotracheal tube with the tip 4.5 cm above the carina. 2. Findings concerning for mild pulmonary edema. Electronically Signed   By: Elige KoHetal  Patel   On: 01/13/2016 09:21     ASSESSMENT / PLAN: 65 yo AAM with severe resp failure from acute COPD exacerbation  with acute NSTEMI/STEMI Critically ill on vent  PULMONARY 1.Respiratory Failure-plan for SAT/SBT today -continue Full MV support -continue Bronchodilator Therapy -Wean Fio2 and PEEP as tolerated -Iv steroids aggressive BD therapy   CARDIOVASCULAR acute CHF acute STEMI -follow up cardiology recs  RENAL ARF from ATN -follow chem 7 and UO Foley catheter  GASTROINTESTINAL -OGT  HEMATOLOGIC Follow CBC  INFECTIOUS Start empiric abx and check sputum culture  ENDOCRINE - ICU hypoglycemic\Hyperglycemia protocol   NEUROLOGIC - intubated and sedated - minimal sedation to achieve a RASS goal: -1    I have personally obtained a history, examined the patient, evaluated Pertinent laboratory and RadioGraphic/imaging results, and  formulated the assessment and plan   The Patient requires high complexity decision making for assessment and support, frequent evaluation and titration of therapies, application  of advanced monitoring technologies and extensive interpretation of multiple databases. Critical Care Time devoted to patient care services described in this note is 35 minutes.   Overall, patient is critically ill, prognosis is guarded.  Patient with Multiorgan failure and at high risk for cardiac arrest and death.    Lucie Leather, M.D.  [facility]  Medical Director [facility] Medical Director [facility] Cardio-Pulmonary Department

## 2016-01-14 NOTE — Progress Notes (Signed)
S/p extubation Patient has increased WOB upon exertion, increased HR and increased WOB Drop in 02 sats  Will give lasix and morphine and try biPAP    Lucie LeatherKurian David Kazue Cerro, M.D.  [facility]  Medical Director [facility] Medical Director [facility] Cardio-Pulmonary Department

## 2016-01-14 NOTE — Progress Notes (Signed)
attemting to wean pt from vent, tube feeds turned off, BG at 8 am  Were 65 and 60. Pt is NPO at this time. Md notified D50 orderd and given, will recheck BG shortly

## 2016-01-14 NOTE — Progress Notes (Signed)
Patient extubated to 2lnc by Dr. Belia HemanKasa and Suella Groveyra Rn. Tol well at this time with sat of 100% on the 2lnc.

## 2016-01-14 NOTE — Progress Notes (Signed)
Sats 100% on 90%, decreased to 80%,  RN notified

## 2016-01-14 NOTE — Progress Notes (Signed)
Family at beside, Pt on Bipap at 80%V , sats high 90s Bipap being weaned down. Precedex at 1, HR 90, BP 115 systolic and improving, Pt much improved Skin warm and dry , report to Blima RichPam Myers RN

## 2016-01-14 NOTE — Progress Notes (Signed)
sats 96% on 100% fio2, decreased to 90%, Cyra RN notifed

## 2016-01-14 NOTE — Progress Notes (Signed)
Patient ID: Damon [name], male   DOB: [birth year], 65 y.o.   MRN: [id number]    Subjective:  Denies SSCP, palpitations or Dyspnea Extubated   Objective:  Vitals:   01/14/16 0602 01/14/16 0800 01/14/16 0818 01/14/16 0829  BP:    134/75  Pulse:    (!) 103  Resp:      Temp:  99.3 F (37.4 C)    TempSrc:      SpO2:   100%   Weight: 83.5 kg (184 lb 1.4 oz)       Intake/Output from previous day:  Intake/Output Summary (Last 24 hours) at 01/14/16 1046 Last data filed at 01/14/16 0935  Gross per 24 hour  Intake          1146.41 ml  Output             1580 ml  Net          -433.59 ml    Physical Exam: Affect appropriate Black male  HEENT: poor dentition  Neck supple with no adenopathy JVP normal no bruits no thyromegaly Lungs clear with no wheezing and good diaphragmatic motion Heart:  S1/S2 no murmur, no rub, gallop or click PMI normal Abdomen: benighn, BS positve, no tenderness, no AAA no bruit.  No HSM or HJR Distal pulses intact with no bruits No edema Neuro non-focal Skin warm and dry No muscular weakness Radial cath band still in place    Lab Results: Basic Metabolic Panel:  Recent Labs  01/13/16 1426 01/13/16 2132 01/14/16 0424  NA 136  --  136  K 3.6  --  4.4  CL 105  --  110  CO2 21*  --  21*  GLUCOSE 301*  --  134*  BUN 25*  --  36*  CREATININE 1.52*  --  1.85*  CALCIUM 8.8*  --  8.5*  MG 2.0 1.7 1.8  PHOS 4.0 2.9 3.0   Liver Function Tests:  Recent Labs  01/13/16 0840 01/14/16 0424  AST 70* 44*  ALT 46 39  ALKPHOS 106 84  BILITOT 0.6 0.2*  PROT 7.1 6.1*  ALBUMIN 3.8 3.3*   No results for input(s): LIPASE, AMYLASE in the last 72 hours. CBC:  Recent Labs  01/13/16 0840  01/13/16 2132 01/14/16 0424  WBC 11.5*  < > 12.4* 16.5*  NEUTROABS 6.8*  --   --   --   HGB 13.4  < > 12.2* 11.7*  HCT 39.5*  < > 35.4* 33.6*  MCV 97.5  < > 94.7 94.1  PLT 257  < > 230 233  < > = values in this interval not displayed. Cardiac  Enzymes:  Recent Labs  01/13/16 0840 01/13/16 2132 01/14/16 0424  TROPONINI 0.32* 3.28* 4.35*    Imaging: Dg Abd 1 View  Result Date: 01/13/2016 CLINICAL DATA:  Assess position of the nasogastric tube EXAM: ABDOMEN - 1 VIEW COMPARISON:  CT scan of the chest of today's date FINDINGS: The nasogastric tube tip projects in the region of gastric fundus with the proximal port in the cardia. The bowel gas pattern is within the limits of normal. External pacemaker defibrillator pads are present over the cardiac silhouette. There is patchy increased density in the right infrahilar region which may reflect atelectasis or pneumonia. The endotracheal tube tip lies approximately 3.2 cm above the carina. There is calcification in the wall of the aortic arch. There is contrast within the renal collecting systems from CT scan of the chest earlier today.  The IMPRESSION: The nasogastric tube tip lies in the gastric fundus with the proximal port in the cardia. Advancement by 5-10 cc would be useful. Thoracic aortic atherosclerosis. Electronically Signed   By: David  Martinique M.D.   On: 01/13/2016 14:40   Ct Head Wo Contrast  Result Date: 01/13/2016 CLINICAL DATA:  Shortness of breath, worsening over the night. Altered mental status. EXAM: CT HEAD WITHOUT CONTRAST TECHNIQUE: Contiguous axial images were obtained from the base of the skull through the vertex without intravenous contrast. COMPARISON:  09/03/2012 FINDINGS: Brain: Mild atrophy. No acute intracranial abnormality. Specifically, no hemorrhage, hydrocephalus, mass lesion, acute infarction, or significant intracranial injury. Vascular: No hyperdense vessel or unexpected calcification. Skull: No acute calvarial abnormality. Sinuses/Orbits: Visualized paranasal sinuses and mastoids clear. Orbital soft tissues unremarkable. Other: None IMPRESSION: No acute intracranial abnormality. Electronically Signed   By: Rolm Baptise M.D.   On: 01/13/2016 09:07   Dg Chest  Port 1 View  Result Date: 01/14/2016 CLINICAL DATA:  Acute respiratory failure. Followup ventilator support. EXAM: PORTABLE CHEST 1 VIEW COMPARISON:  01/13/2016 FINDINGS: Endotracheal tube has its tip 2 cm above the carina. Nasogastric tube enters stomach. Infiltrate/ edema in both lower lobes persists but is slightly improved. No worsening or new finding. IMPRESSION: Slight radiographic improvement in infiltrate/ edema in both lower lobes. Electronically Signed   By: Nelson Chimes M.D.   On: 01/14/2016 07:03   Dg Chest Port 1 View  Result Date: 01/13/2016 CLINICAL DATA:  Shortness of breath.  Status post intubation. EXAM: PORTABLE CHEST 1 VIEW COMPARISON:  03/24/2015 FINDINGS: Endotracheal tube with the tip 4.5 cm above the carina. Bilateral perihilar interstitial and alveolar airspace opacities with indistinctness of the central pulmonary vasculature. No significant pleural effusion. No pneumothorax. Stable cardiomediastinal silhouette. No acute osseous abnormality. IMPRESSION: 1. Endotracheal tube with the tip 4.5 cm above the carina. 2. Findings concerning for mild pulmonary edema. Electronically Signed   By: Kathreen Devoid   On: 01/13/2016 09:21    Cardiac Studies:  ECG: SR anterolateral MI with residual ST elevation    Telemetry: SR/ST   Echo: EF 25-30% anterior MI   Medications:   . aspirin  81 mg Oral Daily  . atorvastatin  80 mg Oral q1800  . azithromycin (ZITHROMAX) 500 MG IVPB  500 mg Intravenous Once  . budesonide (PULMICORT) nebulizer solution  0.5 mg Nebulization BID  . carvedilol  3.125 mg Oral BID WC  . chlorhexidine gluconate (MEDLINE KIT)  15 mL Mouth Rinse BID  . dextrose  1 ampule Intravenous Once  . famotidine (PEPCID) IV  20 mg Intravenous Q12H  . feeding supplement (PRO-STAT SUGAR FREE 64)  30 mL Per Tube QID  . feeding supplement (VITAL HIGH PROTEIN)  1,000 mL Per Tube Q24H  . heparin  5,000 Units Subcutaneous Q8H  . insulin aspart  2-6 Units Subcutaneous Q4H  .  ipratropium-albuterol  3 mL Nebulization Q4H  . mouth rinse  15 mL Mouth Rinse QID  . sodium chloride flush  3 mL Intravenous Q12H  . ticagrelor  90 mg Oral BID     . propofol (DIPRIVAN) infusion Stopped (01/14/16 0700)    Assessment/Plan:  MI:  Admitted with CHF and respiratory failure. Post mid LAD stenting for 100% occlusion although not called STEMI.  HR and BP up this am post extubation start Oral beta blocker and ACE.  Still has residual OM and RCA disease and may need intervention of OM staged this hopitalization  Continue heparin and ticagrelor  Add coreg and ACe  Chol:  On statin   CHF:  Extubated CXR improved will give dose lasix now  COPD: Discussed smoking cessation patient has patches at home continue antibiotics and nebs   Jenkins Rouge 01/14/2016, 10:46 AM

## 2016-01-14 NOTE — Progress Notes (Signed)
Patient complaining of pressure being too much on current settings on bipap, Dr Belia HemanKasa notified and orders given to titrate settings to 10/5 and wean fio2 as tolerated.

## 2016-01-14 NOTE — Progress Notes (Signed)
Decreased to room air, sats 100%

## 2016-01-14 NOTE — Progress Notes (Signed)
Pt extubated early this am, was on 02 at 2 L , sat out of bed for long periods this am,.  Then on RA with stable VS and no complaints.  Then at 30 or so pt stood to urinate, and HR increased to 120s, pt became sweaty and SOB, inc WOB, BP elevated. MD in  To see stat and new orders noted. MS 2 mg given, then 80 IV lasix, then another 2 of MS , foley catheter reinserted and Precedex drip begun. Family updated re condition and sitting in room with patient at this time

## 2016-01-15 ENCOUNTER — Inpatient Hospital Stay: Payer: Commercial Managed Care - PPO

## 2016-01-15 LAB — BASIC METABOLIC PANEL
Anion gap: 7 (ref 5–15)
BUN: 43 mg/dL — AB (ref 6–20)
CALCIUM: 8.5 mg/dL — AB (ref 8.9–10.3)
CO2: 23 mmol/L (ref 22–32)
CREATININE: 1.78 mg/dL — AB (ref 0.61–1.24)
Chloride: 107 mmol/L (ref 101–111)
GFR calc non Af Amer: 39 mL/min — ABNORMAL LOW (ref >60)
GFR, EST AFRICAN AMERICAN: 45 mL/min — AB (ref >60)
GLUCOSE: 143 mg/dL — AB (ref 65–99)
Potassium: 3.8 mmol/L (ref 3.5–5.1)
Sodium: 137 mmol/L (ref 135–145)

## 2016-01-15 LAB — GLUCOSE, CAPILLARY
GLUCOSE-CAPILLARY: 155 mg/dL — AB (ref 65–99)
GLUCOSE-CAPILLARY: 230 mg/dL — AB (ref 65–99)
Glucose-Capillary: 147 mg/dL — ABNORMAL HIGH (ref 65–99)
Glucose-Capillary: 108 mg/dL — ABNORMAL HIGH (ref 65–99)
Glucose-Capillary: 197 mg/dL — ABNORMAL HIGH (ref 65–99)

## 2016-01-15 LAB — CBC
HEMATOCRIT: 34.9 % — AB (ref 40.0–52.0)
Hemoglobin: 12.2 g/dL — ABNORMAL LOW (ref 13.0–18.0)
MCH: 33.1 pg (ref 26.0–34.0)
MCHC: 35 g/dL (ref 32.0–36.0)
MCV: 94.6 fL (ref 80.0–100.0)
Platelets: 226 10*3/uL (ref 150–440)
RBC: 3.69 MIL/uL — ABNORMAL LOW (ref 4.40–5.90)
RDW: 14 % (ref 11.5–14.5)
WBC: 15.7 10*3/uL — ABNORMAL HIGH (ref 3.8–10.6)

## 2016-01-15 LAB — MAGNESIUM: Magnesium: 2 mg/dL (ref 1.7–2.4)

## 2016-01-15 LAB — PHOSPHORUS: PHOSPHORUS: 4 mg/dL (ref 2.5–4.6)

## 2016-01-15 MED ORDER — INSULIN ASPART 100 UNIT/ML ~~LOC~~ SOLN
0.0000 [IU] | Freq: Every day | SUBCUTANEOUS | Status: DC
  Administered 2016-01-15 – 2016-01-16 (×2): 2 [IU] via SUBCUTANEOUS
  Filled 2016-01-15 (×2): qty 2

## 2016-01-15 MED ORDER — INSULIN ASPART 100 UNIT/ML ~~LOC~~ SOLN
0.0000 [IU] | Freq: Three times a day (TID) | SUBCUTANEOUS | Status: DC
  Administered 2016-01-15 (×2): 2 [IU] via SUBCUTANEOUS
  Administered 2016-01-16: 7 [IU] via SUBCUTANEOUS
  Administered 2016-01-17 (×2): 2 [IU] via SUBCUTANEOUS
  Administered 2016-01-17: 3 [IU] via SUBCUTANEOUS
  Filled 2016-01-15 (×3): qty 2
  Filled 2016-01-15: qty 3
  Filled 2016-01-15: qty 2
  Filled 2016-01-15: qty 7
  Filled 2016-01-15: qty 2

## 2016-01-15 MED ORDER — IPRATROPIUM-ALBUTEROL 0.5-2.5 (3) MG/3ML IN SOLN
3.0000 mL | Freq: Four times a day (QID) | RESPIRATORY_TRACT | Status: DC | PRN
  Administered 2016-01-15 – 2016-01-16 (×3): 3 mL via RESPIRATORY_TRACT
  Filled 2016-01-15 (×3): qty 3

## 2016-01-15 MED ORDER — FUROSEMIDE 10 MG/ML IJ SOLN
60.0000 mg | Freq: Once | INTRAMUSCULAR | Status: AC
  Administered 2016-01-15: 60 mg via INTRAVENOUS
  Filled 2016-01-15: qty 6

## 2016-01-15 NOTE — Progress Notes (Signed)
Patient transferred to rm 231. Report given to Leslie Dalesoll, Charity fundraiserN. Telemetry box placed on patient, CCMD and Elink notified of transfer. Patient had no complaints on transfer, vss and on RA (98%). Wife with patient. Trudee KusterBrandi R Mansfield

## 2016-01-15 NOTE — Progress Notes (Signed)
Patient ID: Damon [name], male   DOB: [birth year], 65 y.o.   MRN: [id number]     Subjective:  Denies SSCP, palpitations or Dyspnea Wants a walker to ambulate   Objective:  Vitals:   01/15/16 0800 01/15/16 0900 01/15/16 1000 01/15/16 1100  BP: 135/79 125/78 130/74   Pulse: 82 88 84 84  Resp: 11 16 (!) 21 18  Temp: 98.2 F (36.8 C)     TempSrc: Oral     SpO2: 100% 97% 98% 98%  Weight:      Height:  5\' 8"  (1.727 m)      Intake/Output from previous day:  Intake/Output Summary (Last 24 hours) at 01/15/16 1110 Last data filed at 01/15/16 1040  Gross per 24 hour  Intake              475 ml  Output             4175 ml  Net            -3700 ml    Physical Exam: Affect appropriate Black male :  Poor dentition  HEENT: normal Neck supple with no adenopathy JVP normal no bruits no thyromegaly Lungs clear with no wheezing and good diaphragmatic motion Heart:  S1/S2 no murmur, no rub, gallop or click PMI normal Abdomen: benighn, BS positve, no tenderness, no AAA no bruit.  No HSM or HJR Distal pulses intact with no bruits No edema Neuro non-focal Skin warm and dry No muscular weakness Right radial still excellent band removed   Lab Results: Basic Metabolic Panel:  Recent Labs  40/98/11 0424 01/15/16 0431  NA 136 137  K 4.4 3.8  CL 110 107  CO2 21* 23  GLUCOSE 134* 143*  BUN 36* 43*  CREATININE 1.85* 1.78*  CALCIUM 8.5* 8.5*  MG 1.8 2.0  PHOS 3.0 4.0   Liver Function Tests:  Recent Labs  01/13/16 0840 01/14/16 0424  AST 70* 44*  ALT 46 39  ALKPHOS 106 84  BILITOT 0.6 0.2*  PROT 7.1 6.1*  ALBUMIN 3.8 3.3*   No results for input(s): LIPASE, AMYLASE in the last 72 hours. CBC:  Recent Labs  01/13/16 0840  01/14/16 0424 01/15/16 0431  WBC 11.5*  < > 16.5* 15.7*  NEUTROABS 6.8*  --   --   --   HGB 13.4  < > 11.7* 12.2*  HCT 39.5*  < > 33.6* 34.9*  MCV 97.5  < > 94.1 94.6  PLT 257  < > 233 226  < > = values in this interval not displayed. Cardiac  Enzymes:  Recent Labs  01/13/16 0840 01/13/16 2132 01/14/16 0424  TROPONINI 0.32* 3.28* 4.35*    Imaging: Dg Abd 1 View  Result Date: 01/13/2016 CLINICAL DATA:  Assess position of the nasogastric tube EXAM: ABDOMEN - 1 VIEW COMPARISON:  CT scan of the chest of today's date FINDINGS: The nasogastric tube tip projects in the region of gastric fundus with the proximal port in the cardia. The bowel gas pattern is within the limits of normal. External pacemaker defibrillator pads are present over the cardiac silhouette. There is patchy increased density in the right infrahilar region which may reflect atelectasis or pneumonia. The endotracheal tube tip lies approximately 3.2 cm above the carina. There is calcification in the wall of the aortic arch. There is contrast within the renal collecting systems from CT scan of the chest earlier today. The IMPRESSION: The nasogastric tube tip lies in the gastric fundus with  the proximal port in the cardia. Advancement by 5-10 cc would be useful. Thoracic aortic atherosclerosis. Electronically Signed   By: David  SwazilandJordan M.D.   On: 01/13/2016 14:40   Dg Chest Port 1 View  Result Date: 01/15/2016 CLINICAL DATA:  Patient being seen for acute respiratory failure. Chronic kidney disease, diabetes. EXAM: PORTABLE CHEST 1 VIEW COMPARISON:  01/14/2016. FINDINGS: The heart remains enlarged. ET tube and NG tube have been removed. The patient has developed faint opacities at both bases, likely mild atelectasis post extubation. Continued surveillance warranted to exclude early infiltrates. No effusion or pneumothorax. Bones unremarkable. Thoracic atherosclerosis. IMPRESSION: Suspected post extubation subsegmental atelectasis at the bases. Electronically Signed   By: Elsie StainJohn T Curnes M.D.   On: 01/15/2016 07:44   Dg Chest Port 1 View  Result Date: 01/14/2016 CLINICAL DATA:  Acute respiratory failure. Followup ventilator support. EXAM: PORTABLE CHEST 1 VIEW COMPARISON:   01/13/2016 FINDINGS: Endotracheal tube has its tip 2 cm above the carina. Nasogastric tube enters stomach. Infiltrate/ edema in both lower lobes persists but is slightly improved. No worsening or new finding. IMPRESSION: Slight radiographic improvement in infiltrate/ edema in both lower lobes. Electronically Signed   By: Paulina FusiMark  Shogry M.D.   On: 01/14/2016 07:03    Cardiac Studies:  ECG:  SR some residual ST elevation anterior MI   Telemetry:  NSR no VT   Echo:  EF 25-30%  Medications:   . aspirin  81 mg Oral Daily  . atorvastatin  80 mg Oral q1800  . azithromycin (ZITHROMAX) 500 MG IVPB  500 mg Intravenous Once  . carvedilol  6.25 mg Oral BID WC  . dextrose  1 ampule Intravenous Once  . famotidine (PEPCID) IV  20 mg Intravenous Q12H  . heparin  5,000 Units Subcutaneous Q8H  . insulin aspart  0-5 Units Subcutaneous QHS  . insulin aspart  0-9 Units Subcutaneous TID WC  . lisinopril  5 mg Oral Daily  . nicotine  21 mg Transdermal Daily  . sodium chloride flush  3 mL Intravenous Q12H  . ticagrelor  90 mg Oral BID       Assessment/Plan:  Anterior MI:  Admitted with respiratory failure and subacute MI. Post DES to mid LAD with residual OM and RCA disease. Not clear if Dr Kirke CorinArida will intervene on OM tomorrow will make NPO.  Hold lasix with good diuresis continue beta blocker and ACE re check BMET in am Continue DAT   Chol: high dose statin  Smoking:  No active wheezing on zithromax and transdermal patch   Damon [name] 01/15/2016, 11:10 AM

## 2016-01-15 NOTE — Progress Notes (Signed)
eLink Physician-Brief Progress Note Patient Name: Damon [name] DOB: 06-11-1950 MRN: [id number]   Date of Service  01/15/2016  HPI/Events of Note  Camera check on patient with acute respiratory failure. Sleeping with BiPAP mask in place. Respiratory rate 23 & saturation 99%. Heart rate 80 & BP 129/79. BiPAP set up with IPAP 10 & EPAP 5. FiO2 0.28. Rate 12.   eICU Interventions  Continuing BiPAP support with minimal settings & close ICU monitoring.      Intervention Category Major Interventions: Respiratory failure - evaluation and management Intermediate Interventions: Respiratory distress - evaluation and management  Lawanda CousinsJennings Mathhew Buysse 01/15/2016, 2:07 AM

## 2016-01-15 NOTE — Progress Notes (Signed)
PULMONARY / CRITICAL CARE MEDICINE   Name: Damon [name] MRN: [id number] DOB: [birth year]    ADMISSION DATE:  01/13/2016   CHIEF COMPLAINT:  resp failure  HISTORY OF PRESENT ILLNESS:  65 yo AAM seen in ER today for acute resp failure with reported history of medication noncompliance per his wife with breathing problems, inhaler use, unknown whether or not he is had any cardiac disease,  - reports no history of heart attack.  -Never been previously intubated. - She states that he was complaining of some trouble breathing last night  - he did not go into work overnight. This morning when she woke up he was already awake and having a lot more trouble breathing and was opening the refrigerator to get cold air and then went outside and became altered when she decided to call EMS.  Patient had low o2 sats 50%, patient was emergently intubated, placed on full vent support   SUBJECTIVE   10/6 s/p cath LAD lesion Extubated yesterday, on biPAP and given lasix Feels much better this AM, resp status improved with lasix   Review of Systems:  Gen:  Denies  fever, sweats, chills weigh loss   HEENT: Denies blurred vision, double vision, ear pain, eye pain, hearing loss, nose bleeds, sore throat  Cardiac:  No dizziness, chest pain or heaviness, chest tightness,edema  Resp:   Denies cough or sputum porduction, shortness of breath,wheezing, hemoptysis,   Gi: Denies swallowing difficulty, stomach pain, nausea or vomiting, diarrhea, constipation, bowel incontinence  Other:  All other systems negative   VITAL SIGNS: Temp:  [97.7 F (36.5 C)-99.3 F (37.4 C)] 98.2 F (36.8 C) (10/07 2000) Pulse Rate:  [78-124] 86 (10/08 0700) Resp:  [10-37] 19 (10/08 0700) BP: (96-200)/(67-117) 141/82 (10/08 0700) SpO2:  [92 %-100 %] 98 % (10/08 0700) FiO2 (%):  [28 %-100 %] 28 % (10/08 0344) Weight:  [167 lb 8.8 oz (76 kg)] 167 lb 8.8 oz (76 kg) (10/08 0400) HEMODYNAMICS:   VENTILATOR SETTINGS: FiO2  (%):  [28 %-100 %] 28 % INTAKE / OUTPUT:  Intake/Output Summary (Last 24 hours) at 01/15/16 0736 Last data filed at 01/15/16 0400  Gross per 24 hour  Intake              625 ml  Output             3125 ml  Net            -2500 ml    PHYSICAL EXAMINATION:  GENERAL:NAD, alert and awake HEAD: Normocephalic, atraumatic.  EYES: Pupils equal, round, reactive to light.  No scleral icterus.  MOUTH: Moist mucosal membrane. NECK: Supple. No thyromegaly. No nodules. No JVD. c collar in place PULMONARY: Diffuse coarse rhonchi right sided +wheezes +rhonchi CARDIOVASCULAR: S1 and S2. Regular rate and rhythm. No murmurs, rubs, or gallops.  GASTROINTESTINAL: Soft, nontender, +distended. No masses. Positive bowel sounds. No hepatosplenomegaly.  MUSCULOSKELETAL: No swelling, clubbing, or edema.  NEUROLOGIC: no focal weakness SKIN:intact,warm,dry    CBC  Recent Labs Lab 01/13/16 2132 01/14/16 0424 01/15/16 0431  WBC 12.4* 16.5* 15.7*  HGB 12.2* 11.7* 12.2*  HCT 35.4* 33.6* 34.9*  PLT 230 233 226   Coag's  Recent Labs Lab 01/13/16 0840 01/13/16 1426  APTT 27  --   INR 1.11 1.19   BMET  Recent Labs Lab 01/13/16 1426 01/14/16 0424 01/15/16 0431  NA 136 136 137  K 3.6 4.4 3.8  CL 105 110 107  CO2 21* 21* 23  BUN 25* 36* 43*  CREATININE 1.52* 1.85* 1.78*  GLUCOSE 301* 134* 143*   Electrolytes  Recent Labs Lab 01/13/16 1426 01/13/16 2132 01/14/16 0424 01/15/16 0431  CALCIUM 8.8*  --  8.5* 8.5*  MG 2.0 1.7 1.8 2.0  PHOS 4.0 2.9 3.0 4.0   Sepsis Markers  Recent Labs Lab 01/13/16 0840 01/13/16 1452 01/14/16 1946  LATICACIDVEN 5.3* 3.7* 0.9   ABG  Recent Labs Lab 01/13/16 0946 01/14/16 0500  PHART 7.16* 7.40  PCO2ART 57* 33  PO2ART 322* 106   Liver Enzymes  Recent Labs Lab 01/13/16 0840 01/14/16 0424  AST 70* 44*  ALT 46 39  ALKPHOS 106 84  BILITOT 0.6 0.2*  ALBUMIN 3.8 3.3*   Cardiac Enzymes  Recent Labs Lab 01/13/16 0840  01/13/16 2132 01/14/16 0424  TROPONINI 0.32* 3.28* 4.35*   Glucose  Recent Labs Lab 01/14/16 0830 01/14/16 1120 01/14/16 1558 01/14/16 1952 01/14/16 2345 01/15/16 0343  GLUCAP 145* 188* 139* 93 144* 147*    Imaging No results found.   ASSESSMENT / PLAN: 65 yo AAM with severe resp failure from acute COPD exacerbation with acute NSTEMI/STEMI   PULMONARY resp status much imprpoved  CARDIOVASCULAR acute CHF acute STEMI -follow up cardiology recs -lasix as tolerated  RENAL ARF from ATN -follow chem 7 and UO Foley catheter  GASTROINTESTINAL -advance diet  HEMATOLOGIC Follow CBC   I have personally obtained a history, examined the patient, evaluated Pertinent laboratory and RadioGraphic/imaging results, and  formulated the assessment and plan   The Patient requires high complexity decision making for assessment and support, frequent evaluation and titration of therapies, application of advanced monitoring technologies and extensive interpretation of multiple databases.    Patient OK to transfer to gen med floor Dr. Sheryle Hail notified of transfer to Hospitalist   Laiklyn Pilkenton Santiago Glad, M.D.  [facility]  Medical Director [facility] Medical Director [facility] Cardio[facility] Department

## 2016-01-15 NOTE — Progress Notes (Signed)
Urinal. A & O. Pt takes meds ok. FS are stable. Wife at the bedside. Pt reports no chest pain. 3 L of oxygen. NSR. Pt has no further concerns at this time.

## 2016-01-16 ENCOUNTER — Encounter: Payer: Self-pay | Admitting: Cardiovascular Disease

## 2016-01-16 ENCOUNTER — Encounter
Admission: EM | Disposition: A | Discharge status: Home / Self Care | Payer: Self-pay | Source: Home / Self Care | Attending: Internal Medicine

## 2016-01-16 HISTORY — PX: CARDIAC CATHETERIZATION: SHX172

## 2016-01-16 LAB — BASIC METABOLIC PANEL
ANION GAP: 9 (ref 5–15)
BUN: 49 mg/dL — ABNORMAL HIGH (ref 6–20)
CO2: 23 mmol/L (ref 22–32)
CREATININE: 1.78 mg/dL — AB (ref 0.61–1.24)
Calcium: 8.9 mg/dL (ref 8.9–10.3)
Chloride: 107 mmol/L (ref 101–111)
GFR calc non Af Amer: 39 mL/min — ABNORMAL LOW (ref >60)
GFR, EST AFRICAN AMERICAN: 45 mL/min — AB (ref >60)
Glucose, Bld: 197 mg/dL — ABNORMAL HIGH (ref 65–99)
POTASSIUM: 3.6 mmol/L (ref 3.5–5.1)
Sodium: 139 mmol/L (ref 135–145)

## 2016-01-16 LAB — GLUCOSE, CAPILLARY
GLUCOSE-CAPILLARY: 171 mg/dL — AB (ref 65–99)
GLUCOSE-CAPILLARY: 162 mg/dL — AB (ref 65–99)
GLUCOSE-CAPILLARY: 311 mg/dL — AB (ref 65–99)
Glucose-Capillary: 204 mg/dL — ABNORMAL HIGH (ref 65–99)

## 2016-01-16 SURGERY — LEFT HEART CATH AND CORONARY ANGIOGRAPHY
Anesthesia: Moderate Sedation

## 2016-01-16 MED ORDER — ASPIRIN 81 MG PO CHEW
CHEWABLE_TABLET | ORAL | Status: DC | PRN
  Administered 2016-01-16: 81 mg via ORAL

## 2016-01-16 MED ORDER — SODIUM CHLORIDE 0.9% FLUSH: 3.0000 mL | INTRAVENOUS | Status: DC | PRN

## 2016-01-16 MED ORDER — NITROGLYCERIN 5 MG/ML IV SOLN
INTRAVENOUS | Status: AC
  Filled 2016-01-16: qty 10

## 2016-01-16 MED ORDER — BIVALIRUDIN BOLUS VIA INFUSION - CUPID
INTRAVENOUS | Status: DC | PRN
  Administered 2016-01-16: 55.05 mg via INTRAVENOUS

## 2016-01-16 MED ORDER — FAMOTIDINE 20 MG PO TABS
20.0000 mg | ORAL_TABLET | Freq: Two times a day (BID) | ORAL | Status: DC
  Administered 2016-01-16 – 2016-01-17 (×2): 20 mg via ORAL
  Filled 2016-01-16 (×2): qty 1

## 2016-01-16 MED ORDER — SODIUM CHLORIDE 0.9 % IV SOLN: 250.0000 mL | INTRAVENOUS | Status: DC | PRN

## 2016-01-16 MED ORDER — SODIUM CHLORIDE 0.9 % IV SOLN: INTRAVENOUS | Status: DC

## 2016-01-16 MED ORDER — CARVEDILOL 12.5 MG PO TABS
12.5000 mg | ORAL_TABLET | Freq: Two times a day (BID) | ORAL | Status: DC
  Administered 2016-01-16 – 2016-01-17 (×3): 12.5 mg via ORAL
  Filled 2016-01-16 (×3): qty 1

## 2016-01-16 MED ORDER — VERAPAMIL HCL 2.5 MG/ML IV SOLN
INTRAVENOUS | Status: AC
  Filled 2016-01-16: qty 2

## 2016-01-16 MED ORDER — SODIUM CHLORIDE 0.9 % IV SOLN
INTRAVENOUS | Status: DC
  Administered 2016-01-16 (×2): via INTRAVENOUS

## 2016-01-16 MED ORDER — MIDAZOLAM HCL 2 MG/2ML IJ SOLN
INTRAMUSCULAR | Status: DC | PRN
  Administered 2016-01-16: 1 mg via INTRAVENOUS

## 2016-01-16 MED ORDER — BIVALIRUDIN 250 MG IV SOLR
INTRAVENOUS | Status: AC
  Filled 2016-01-16: qty 250

## 2016-01-16 MED ORDER — HEPARIN (PORCINE) IN NACL 2-0.9 UNIT/ML-% IJ SOLN
INTRAMUSCULAR | Status: AC
  Filled 2016-01-16: qty 500

## 2016-01-16 MED ORDER — SODIUM CHLORIDE 0.9 % IV SOLN
INTRAVENOUS | Status: DC | PRN
  Administered 2016-01-16: 1.75 mg/kg/h via INTRAVENOUS

## 2016-01-16 MED ORDER — MIDAZOLAM HCL 2 MG/2ML IJ SOLN
INTRAMUSCULAR | Status: AC
  Filled 2016-01-16: qty 2

## 2016-01-16 MED ORDER — ASPIRIN 81 MG PO CHEW
CHEWABLE_TABLET | ORAL | Status: AC
  Filled 2016-01-16: qty 1

## 2016-01-16 MED ORDER — SODIUM CHLORIDE 0.9 % IV SOLN
INTRAVENOUS | Status: AC
  Administered 2016-01-16: 16:00:00 via INTRAVENOUS

## 2016-01-16 MED ORDER — FENTANYL CITRATE (PF) 100 MCG/2ML IJ SOLN
INTRAMUSCULAR | Status: DC | PRN
  Administered 2016-01-16: 25 ug via INTRAVENOUS

## 2016-01-16 MED ORDER — INSULIN GLARGINE 100 UNIT/ML ~~LOC~~ SOLN
10.0000 [IU] | Freq: Every day | SUBCUTANEOUS | Status: DC
  Administered 2016-01-16: 10 [IU] via SUBCUTANEOUS
  Filled 2016-01-16 (×2): qty 0.1

## 2016-01-16 MED ORDER — FENTANYL CITRATE (PF) 100 MCG/2ML IJ SOLN
INTRAMUSCULAR | Status: AC
  Filled 2016-01-16: qty 2

## 2016-01-16 MED ORDER — SODIUM CHLORIDE 0.9% FLUSH: 3.0000 mL | Freq: Two times a day (BID) | INTRAVENOUS | Status: DC

## 2016-01-16 MED ORDER — TICAGRELOR 90 MG PO TABS
ORAL_TABLET | ORAL | Status: DC | PRN
  Administered 2016-01-16: 90 mg via ORAL

## 2016-01-16 MED ORDER — TICAGRELOR 90 MG PO TABS
ORAL_TABLET | ORAL | Status: AC
  Filled 2016-01-16: qty 1

## 2016-01-16 MED ORDER — IOPAMIDOL (ISOVUE-300) INJECTION 61%
INTRAVENOUS | Status: DC | PRN
  Administered 2016-01-16: 65 mL via INTRA_ARTERIAL

## 2016-01-16 SURGICAL SUPPLY — 13 items
BALLN TREK RX 2.5X15 (BALLOONS) ×3
BALLN ~~LOC~~ TREK RX 3.0X12 (BALLOONS) ×3
BALLOON TREK RX 2.5X15 (BALLOONS) IMPLANT
BALLOON ~~LOC~~ TREK RX 3.0X12 (BALLOONS) IMPLANT
CATH VISTA GUIDE 6FR XB3 (CATHETERS) ×2 IMPLANT
DEVICE INFLAT 30 PLUS (MISCELLANEOUS) ×2 IMPLANT
DEVICE RAD TR BAND REGULAR (VASCULAR PRODUCTS) ×2 IMPLANT
GLIDESHEATH SLEND SS 6F .021 (SHEATH) ×2 IMPLANT
KIT MANI 3VAL PERCEP (MISCELLANEOUS) ×3 IMPLANT
PACK CARDIAC CATH (CUSTOM PROCEDURE TRAY) ×3 IMPLANT
STENT XIENCE ALPINE RX 2.75X18 (Permanent Stent) ×2 IMPLANT
WIRE RUNTHROUGH .014X180CM (WIRE) ×2 IMPLANT
WIRE SAFE-T 1.5MM-J .035X260CM (WIRE) ×2 IMPLANT

## 2016-01-16 NOTE — Progress Notes (Signed)
CONCERNING: IV to Oral Route Change Policy  RECOMMENDATION: This patient is receiving famotidine by the intravenous route.  Based on criteria approved by the Pharmacy and Therapeutics Committee, the intravenous medication(s) is/are being converted to the equivalent oral dose form(s).   DESCRIPTION: These criteria include:  The patient is eating (either orally or via tube) and/or has been taking other orally administered medications for a least 24 hours  The patient has no evidence of active gastrointestinal bleeding or impaired GI absorption (gastrectomy, short bowel, patient on TNA or NPO).  If you have questions about this conversion, please contact the Pharmacy Department  []   [phone]( [phone] )  [facility] [x]   [phone]( [phone] )  [facility] []   [phone]( [phone] )  [facility] []   [phone]( [phone] )  [facility] []   [phone]( [phone] )  [facility]   Jayci Ellefson L, [facility] ServicesRPH 01/16/2016 5:46 PM

## 2016-01-16 NOTE — Progress Notes (Signed)
SUBJECTIVE:  The patient was extubated over the weekend. He is feeling very well with no chest pain or shortness of breath. He has not ambulated yet.   Vitals:   01/15/16 1200 01/15/16 1946 01/16/16 0323 01/16/16 0729  BP: (!) 157/77 (!) 149/70 (!) 160/75 (!) 165/81  Pulse: 91 92 91 90  Resp: 19 18 18 16   Temp: 98.6 F (37 C) 98.1 F (36.7 C) 98.8 F (37.1 C) 98.6 F (37 C)  TempSrc: Oral Oral Oral Oral  SpO2: 100% 99% 100% 97%  Weight:   161 lb 14.4 oz (73.4 kg)   Height:        Intake/Output Summary (Last 24 hours) at 01/16/16 0808 Last data filed at 01/16/16 0715  Gross per 24 hour  Intake               50 ml  Output             2150 ml  Net            -2100 ml    LABS: Basic Metabolic Panel:  Recent Labs  16/10/96 0424 01/15/16 0431 01/16/16 0416  NA 136 137 139  K 4.4 3.8 3.6  CL 110 107 107  CO2 21* 23 23  GLUCOSE 134* 143* 197*  BUN 36* 43* 49*  CREATININE 1.85* 1.78* 1.78*  CALCIUM 8.5* 8.5* 8.9  MG 1.8 2.0  --   PHOS 3.0 4.0  --    Liver Function Tests:  Recent Labs  01/13/16 0840 01/14/16 0424  AST 70* 44*  ALT 46 39  ALKPHOS 106 84  BILITOT 0.6 0.2*  PROT 7.1 6.1*  ALBUMIN 3.8 3.3*   No results for input(s): LIPASE, AMYLASE in the last 72 hours. CBC:  Recent Labs  01/13/16 0840  01/14/16 0424 01/15/16 0431  WBC 11.5*  < > 16.5* 15.7*  NEUTROABS 6.8*  --   --   --   HGB 13.4  < > 11.7* 12.2*  HCT 39.5*  < > 33.6* 34.9*  MCV 97.5  < > 94.1 94.6  PLT 257  < > 233 226  < > = values in this interval not displayed. Cardiac Enzymes:  Recent Labs  01/13/16 0840 01/13/16 2132 01/14/16 0424  TROPONINI 0.32* 3.28* 4.35*   BNP: Invalid input(s): POCBNP D-Dimer: No results for input(s): DDIMER in the last 72 hours. Hemoglobin A1C: No results for input(s): HGBA1C in the last 72 hours. Fasting Lipid Panel: No results for input(s): CHOL, HDL, LDLCALC, TRIG, CHOLHDL, LDLDIRECT in the last 72 hours. Thyroid Function Tests: No  results for input(s): TSH, T4TOTAL, T3FREE, THYROIDAB in the last 72 hours.  Invalid input(s): FREET3 Anemia Panel: No results for input(s): VITAMINB12, FOLATE, FERRITIN, TIBC, IRON, RETICCTPCT in the last 72 hours.   PHYSICAL EXAM General: Well developed, well nourished, in no acute distress HEENT:  Normocephalic and atramatic Neck:  No JVD.  Lungs: Clear bilaterally to auscultation and percussion. Heart: HRRR . Normal S1 and S2 without gallops or murmurs.  Abdomen: Bowel sounds are positive, abdomen soft and non-tender  Msk:  Back normal, normal gait. Normal strength and tone for age. Extremities: No clubbing, cyanosis or edema.   Neuro: Alert and oriented X 3. Psych:  Good affect, responds appropriately Right radial pulse is normal with no hematoma.  TELEMETRY: Reviewed telemetry pt in  normal sinus rhythm with no evidence of arrhythmia.  ASSESSMENT AND PLAN:   1. Subacute anterior myocardial infarction: Based on the patient's  presentation, EKG changes and troponin trend, I suspect that he had acute on chronic occlusion of his LAD. He is now status post angioplasty and drug-eluting stent placement to the mid LAD. He is doing very well but continues to have very high-grade stenosis in a large OM 1. I have recommended proceeding with staged OM1 PCI today. Will give gentle hydration before the procedure. I discussed risks and benefits.  Continue dual antiplatelet therapy with aspirin and Brilinta. Continue high-dose statin.  2. Acute systolic heart failure due to myocardial infarction:EF was 25-30%. Continue small dose lisinopril. The dose of carvedilol was increased to 12.5 mg twice daily. We should consider a life vest.  3. Essential hypertension: Blood pressure is elevated. Carvedilol was increased.  4. Hyperlipidemia: Continue high dose atorvastatin.  5. Peripheral arterial disease: Currently with no claudication.   Lorine BearsMuhammad Arida, MD, [facility] 01/16/2016 8:08 AM

## 2016-01-16 NOTE — CV Procedure (Signed)
PCI done on OM1 via right radial artery. No immediate complications.

## 2016-01-16 NOTE — Progress Notes (Signed)
To specials via bed 

## 2016-01-16 NOTE — Progress Notes (Signed)
Report to tanya telemetry.  Keep right wrist elevated on pillow above the heart for today.  Watch right wrist for evidence of bleeding or hematoma.. If bleeding or hematoma noted, hold pressure over the site for at least 15 minutes and notify the physician.  No blending or flexing of the wrist--no lifting for the remainder of the day or for 2 weeks after your procedure. Notify the physician for evidence of infection or if you get a temperature.

## 2016-01-16 NOTE — Progress Notes (Signed)
Pt returned from Specials s/p DES OM.  No distress on ra.  Cardiac monitor in place, pt denies chest pain.  Instructed pt on limited use of rt arm, up on pillow. Wife at bedside, denies need. CB in reach, SR up x 2.

## 2016-01-16 NOTE — Care Management (Signed)
patient transferred to 2A from icu within last 24 hours.  Presented to ED with respiratory failure and required emergent intubation.  There is documentation that patient alos diagnosed with stemi and had pci.  Patient will require assessment for home 02, assess mobilization and a coupon for brilinta

## 2016-01-16 NOTE — Progress Notes (Signed)
[facility] [place] [facility]   PATIENT NAME: Damon [name]    MR#:  [id number]  DATE OF BIRTH:  1951-01-04  SUBJECTIVE:  CHIEF COMPLAINT:   Chief Complaint  Patient presents with  . Shortness of Breath   - admitted with NSTEMI, LAD stent on 01/13/16- acute respiratory failure requiring intubation from CHF exacerbation- extubated and transferred to floor - has also OM1 occlusion- for cardiac cath and stent today as stable - off oxygen  REVIEW OF SYSTEMS:  Review of Systems  Constitutional: Negative for chills, fever and malaise/fatigue.  HENT: Negative for ear discharge, ear pain, nosebleeds and tinnitus.   Eyes: Negative for blurred vision and double vision.  Respiratory: Negative for cough, shortness of breath and wheezing.   Cardiovascular: Negative for chest pain, palpitations and leg swelling.  Gastrointestinal: Negative for abdominal pain, constipation, diarrhea, nausea and vomiting.  Genitourinary: Negative for dysuria and urgency.  Musculoskeletal: Negative for myalgias.  Neurological: Negative for dizziness, sensory change, speech change, focal weakness, seizures and headaches.  Psychiatric/Behavioral: Negative for depression.    DRUG ALLERGIES:  No Known Allergies  VITALS:  Blood pressure (!) 165/81, pulse 90, temperature 98.6 F (37 C), temperature source Oral, resp. rate 16, height 5\' 8"  (1.727 m), weight 73.4 kg (161 lb 14.4 oz), SpO2 97 %.  PHYSICAL EXAMINATION:  Physical Exam  GENERAL:  65 y.o.-year-old patient lying in the bed with no acute distress.  EYES: Pupils equal, round, reactive to light and accommodation. No scleral icterus. Extraocular muscles intact.  HEENT: Head atraumatic, normocephalic. Oropharynx and nasopharynx clear.  NECK:  Supple, no jugular venous distention. No thyroid enlargement, no tenderness.  LUNGS: Normal breath sounds bilaterally, no wheezing, rales,rhonchi or crepitation. No use of accessory muscles of  respiration.  CARDIOVASCULAR: S1, S2 normal. No  rubs, or gallops. 2/6 systolic murmur present. ABDOMEN: Soft, nontender, nondistended. Bowel sounds present. No organomegaly or mass.  EXTREMITIES: No pedal edema, cyanosis, or clubbing.  NEUROLOGIC: Cranial nerves II through XII are intact. Muscle strength 5/5 in all extremities. Sensation intact. Gait not checked.  PSYCHIATRIC: The patient is alert and oriented x 3.  SKIN: No obvious rash, lesion, or ulcer.    LABORATORY PANEL:   CBC  Recent Labs Lab 01/15/16 0431  WBC 15.7*  HGB 12.2*  HCT 34.9*  PLT 226   ------------------------------------------------------------------------------------------------------------------  Chemistries   Recent Labs Lab 01/14/16 0424 01/15/16 0431 01/16/16 0416  NA 136 137 139  K 4.4 3.8 3.6  CL 110 107 107  CO2 21* 23 23  GLUCOSE 134* 143* 197*  BUN 36* 43* 49*  CREATININE 1.85* 1.78* 1.78*  CALCIUM 8.5* 8.5* 8.9  MG 1.8 2.0  --   AST 44*  --   --   ALT 39  --   --   ALKPHOS 84  --   --   BILITOT 0.2*  --   --    ------------------------------------------------------------------------------------------------------------------  Cardiac Enzymes  Recent Labs Lab 01/14/16 0424  TROPONINI 4.35*   ------------------------------------------------------------------------------------------------------------------  RADIOLOGY:  Dg Chest Port 1 View  Result Date: 01/15/2016 CLINICAL DATA:  Patient being seen for acute respiratory failure. Chronic kidney disease, diabetes. EXAM: PORTABLE CHEST 1 VIEW COMPARISON:  01/14/2016. FINDINGS: The heart remains enlarged. ET tube and NG tube have been removed. The patient has developed faint opacities at both bases, likely mild atelectasis post extubation. Continued surveillance warranted to exclude early infiltrates. No effusion or pneumothorax. Bones unremarkable. Thoracic atherosclerosis. IMPRESSION: Suspected post extubation subsegmental  atelectasis at the bases. Electronically Signed   By: Elsie Stain M.D.   On: 01/15/2016 07:44    EKG:   Orders placed or performed during the hospital encounter of 01/13/16  . EKG 12-Lead  . EKG 12-Lead  . EKG 12-Lead  . EKG 12-Lead  . EKG 12-Lead  . EKG 12-Lead  . EKG 12-Lead  . EKG 12-Lead    ASSESSMENT AND PLAN:    65 year old male with past medical history significant for diabetes, hypertension, CK D stage III, peripheral arterial disease admitted with NSTEMI and acute respiratory failure   #1 NSTEMI- had subacute anterior wall MI, appreciate cardiology consult - LAD DES placed on 01/13/16 - cath again today for OM1 intervention - dual anti platelet treatment with aspirin, brilinta. Also on coreg, lisinopril and statin - encourage ambulation - if stable, anticipate discharge tomorrow - cardiac rehab referral  #2 Acute respiratory failure- requiring intubation, extubated over the weekend. - now on room air, secondary to acute systolic CHF - lasix held, as needed diuresis - ECHO with EF 25% - continue coreg, lisinopril. Life vest at discharge  #3 HTN- coreg increased, also on lisinopril  #4 CKD stage 3- stable, cr at 1.8, monitor as needing cardiac cath  #5 Hyperlipidemia- high dose statin  #6 DM- lantus low dose restarted, check a1c. On SSI  #7 DVT prophylaxis- on SQ heparin      All the records are reviewed and case discussed with Care Management/Social Workerr. Management plans discussed with the patient, family and they are in agreement.  CODE STATUS: Full Code  TOTAL TIME TAKING CARE OF THIS PATIENT: 37 minutes.   POSSIBLE D/C TOMORROW, DEPENDING ON CLINICAL CONDITION.   Farron Lafond M.D on 01/16/2016 at 8:46 AM  Between 7am to 6pm - Pager - [phone]  After 6pm go to [email or web address] - password [employer]  [facility]  Office  [phone]  CC: Primary care physician; Emogene Morgan, MD

## 2016-01-17 DIAGNOSIS — I2102 ST elevation (STEMI) myocardial infarction involving left anterior descending coronary artery: Secondary | ICD-10-CM

## 2016-01-17 DIAGNOSIS — E785 Hyperlipidemia, unspecified: Secondary | ICD-10-CM

## 2016-01-17 DIAGNOSIS — E7800 Pure hypercholesterolemia, unspecified: Secondary | ICD-10-CM

## 2016-01-17 DIAGNOSIS — I2510 Atherosclerotic heart disease of native coronary artery without angina pectoris: Secondary | ICD-10-CM

## 2016-01-17 DIAGNOSIS — I255 Ischemic cardiomyopathy: Secondary | ICD-10-CM

## 2016-01-17 DIAGNOSIS — I2109 ST elevation (STEMI) myocardial infarction involving other coronary artery of anterior wall: Principal | ICD-10-CM

## 2016-01-17 DIAGNOSIS — I10 Essential (primary) hypertension: Secondary | ICD-10-CM

## 2016-01-17 LAB — CBC
HEMATOCRIT: 38.6 % — AB (ref 40.0–52.0)
HEMOGLOBIN: 13.1 g/dL (ref 13.0–18.0)
MCH: 32.5 pg (ref 26.0–34.0)
MCHC: 33.8 g/dL (ref 32.0–36.0)
MCV: 96.2 fL (ref 80.0–100.0)
Platelets: 245 10*3/uL (ref 150–440)
RBC: 4.02 MIL/uL — AB (ref 4.40–5.90)
RDW: 13.8 % (ref 11.5–14.5)
WBC: 10.9 10*3/uL — ABNORMAL HIGH (ref 3.8–10.6)

## 2016-01-17 LAB — BASIC METABOLIC PANEL
ANION GAP: 5 (ref 5–15)
BUN: 37 mg/dL — ABNORMAL HIGH (ref 6–20)
CHLORIDE: 111 mmol/L (ref 101–111)
CO2: 25 mmol/L (ref 22–32)
Calcium: 8.7 mg/dL — ABNORMAL LOW (ref 8.9–10.3)
Creatinine, Ser: 1.35 mg/dL — ABNORMAL HIGH (ref 0.61–1.24)
GFR calc non Af Amer: 54 mL/min — ABNORMAL LOW (ref >60)
GLUCOSE: 159 mg/dL — AB (ref 65–99)
POTASSIUM: 4 mmol/L (ref 3.5–5.1)
Sodium: 141 mmol/L (ref 135–145)

## 2016-01-17 LAB — GLUCOSE, CAPILLARY
GLUCOSE-CAPILLARY: 168 mg/dL — AB (ref 65–99)
Glucose-Capillary: 147 mg/dL — ABNORMAL HIGH (ref 65–99)
Glucose-Capillary: 165 mg/dL — ABNORMAL HIGH (ref 65–99)
Glucose-Capillary: 202 mg/dL — ABNORMAL HIGH (ref 65–99)

## 2016-01-17 LAB — HEMOGLOBIN A1C
HEMOGLOBIN A1C: 8.3 % — AB (ref 4.8–5.6)
Mean Plasma Glucose: 192 mg/dL

## 2016-01-17 MED ORDER — FUROSEMIDE 20 MG PO TABS
20.0000 mg | ORAL_TABLET | Freq: Every day | ORAL | Status: DC
  Administered 2016-01-17: 20 mg via ORAL
  Filled 2016-01-17: qty 1

## 2016-01-17 MED ORDER — FUROSEMIDE 20 MG PO TABS: 20.0000 mg | ORAL_TABLET | Freq: Every day | ORAL | 2 refills | Status: DC

## 2016-01-17 MED ORDER — LISINOPRIL 20 MG PO TABS
20.0000 mg | ORAL_TABLET | Freq: Every day | ORAL | Status: DC
  Administered 2016-01-17: 20 mg via ORAL
  Filled 2016-01-17: qty 1

## 2016-01-17 MED ORDER — LISINOPRIL 10 MG PO TABS: 10.0000 mg | ORAL_TABLET | Freq: Every day | ORAL | Status: DC

## 2016-01-17 MED ORDER — SPIRONOLACTONE 25 MG PO TABS
25.0000 mg | ORAL_TABLET | Freq: Every day | ORAL | Status: DC
  Administered 2016-01-17: 25 mg via ORAL
  Filled 2016-01-17: qty 1

## 2016-01-17 MED ORDER — CARVEDILOL 12.5 MG PO TABS: 12.5000 mg | ORAL_TABLET | Freq: Two times a day (BID) | ORAL | 2 refills | Status: DC

## 2016-01-17 MED ORDER — TICAGRELOR 90 MG PO TABS
90.0000 mg | ORAL_TABLET | Freq: Two times a day (BID) | ORAL | 2 refills | Status: DC
Start: 2016-01-17 — End: 2016-06-11

## 2016-01-17 MED ORDER — SPIRONOLACTONE 25 MG PO TABS: 25.0000 mg | ORAL_TABLET | Freq: Every day | ORAL | 2 refills | Status: DC

## 2016-01-17 MED ORDER — LISINOPRIL 20 MG PO TABS: 20.0000 mg | ORAL_TABLET | Freq: Every day | ORAL | 2 refills | Status: DC

## 2016-01-17 MED ORDER — ATORVASTATIN CALCIUM 80 MG PO TABS: 80.0000 mg | ORAL_TABLET | Freq: Every day | ORAL | 2 refills | Status: AC

## 2016-01-17 NOTE — Progress Notes (Signed)
A&O. Up with one assist. Cath with PCI done yesterday via right radial. Pulses good. Possible d/c today.

## 2016-01-17 NOTE — Discharge Summary (Signed)
[facility] - [place] at [facility]   PATIENT NAME: Damon [name]    MR#:  [id number]  DATE OF BIRTH:  1950-04-15  DATE OF ADMISSION:  01/13/2016   ADMITTING PHYSICIAN: Erin Fulling, MD  DATE OF DISCHARGE: 01/17/2016  PRIMARY CARE PHYSICIAN: Emogene Morgan, MD   ADMISSION DIAGNOSIS:   NSTEMI (non-ST elevated myocardial infarction) (HCC) [I21.4] Acute respiratory failure with hypoxia (HCC) [J96.01] Troponin I above reference range [R74.8]  DISCHARGE DIAGNOSIS:   Principal Problem:   NSTEMI (non-ST elevated myocardial infarction) (HCC) Active Problems:   Acute respiratory failure (HCC)   CAD (coronary artery disease)   Essential hypertension   HLD (hyperlipidemia)   SECONDARY DIAGNOSIS:   Past Medical History:  Diagnosis Date  . CKD (chronic kidney disease), stage III   . Diabetes mellitus without complication (HCC)   . Diverticulitis   . Essential hypertension   . PAD (peripheral artery disease) (HCC)    a. 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  . Tobacco abuse     HOSPITAL COURSE:   65 year old male with past medical history significant for diabetes, hypertension, CK D stage III, peripheral arterial disease admitted with NSTEMI and acute respiratory failure  #1 NSTEMI- had subacute anterior wall MI, appreciate cardiology consult - LAD DES placed on 01/13/16 and OM1 DES placed on 01/16/16 - dual anti platelet treatment with aspirin, brilinta. Also on coreg, lisinopril and statin - encourage ambulation - cardiac rehab referral  #2 Acute respiratory failure- requiring intubation, extubated over the weekend. - now on room air, secondary to acute systolic CHF - lasix low dose started.  Aldactone added - ECHO with EF 25% - continue coreg, lisinopril. Life vest at discharge  #3 HTN- coreg, lisinopril, Lasix and Aldactone  #4 CKD stage 3- stable, cr at 1.38 at discharge,  #5 Hyperlipidemia- high dose  statin  #6 DM- lantus restarted Lantus. A1c is 8.3  Stable for discharge today. Will need cardiac rehabilitation referral. CHF clinic follow-up in cardiology follow-up. Life vest at discharge  DISCHARGE CONDITIONS:   Guarded  CONSULTS OBTAINED:   Treatment Team:  Iran Ouch, MD  DRUG ALLERGIES:   No Known Allergies DISCHARGE MEDICATIONS:     Medication List    STOP taking these medications   amLODipine 10 MG tablet Commonly known as:  NORVASC   clopidogrel 75 MG tablet Commonly known as:  PLAVIX   enalapril 20 MG tablet Commonly known as:  VASOTEC   hydrochlorothiazide 25 MG tablet Commonly known as:  HYDRODIURIL   metoprolol 50 MG tablet Commonly known as:  LOPRESSOR     TAKE these medications   aspirin EC 81 MG tablet Take 1 tablet (81 mg total) by mouth daily.   atorvastatin 80 MG tablet Commonly known as:  LIPITOR Take 1 tablet (80 mg total) by mouth daily at 6 PM.   carvedilol 12.5 MG tablet Commonly known as:  COREG Take 1 tablet (12.5 mg total) by mouth 2 (two) times daily with a meal.   furosemide 20 MG tablet Commonly known as:  LASIX Take 1 tablet (20 mg total) by mouth daily.   gabapentin 300 MG capsule Commonly known as:  NEURONTIN Take 300 mg by mouth 2 (two) times daily.   LANTUS SOLOSTAR 100 UNIT/ML Solostar Pen Generic drug:  Insulin Glargine Inject 30 Units into the skin daily.   lisinopril 20 MG tablet Commonly known as:  PRINIVIL,ZESTRIL Take 1 tablet (  20 mg total) by mouth daily. Start taking on:  01/18/2016   multivitamin with minerals Tabs tablet Take 1 tablet by mouth daily.   spironolactone 25 MG tablet Commonly known as:  ALDACTONE Take 1 tablet (25 mg total) by mouth daily.   ticagrelor 90 MG Tabs tablet Commonly known as:  BRILINTA Take 1 tablet (90 mg total) by mouth 2 (two) times daily.   vitamin B-12 1000 MCG tablet Commonly known as:  CYANOCOBALAMIN Take 1,000 mcg by mouth daily.   vitamin E 100  UNIT capsule Take 100 Units by mouth daily.        DISCHARGE INSTRUCTIONS:   1. PCP f/u in 1-2 weeks 2. Cardiology follow-up in 2 weeks  DIET:   Cardiac diet  ACTIVITY:   Activity as tolerated  OXYGEN:   Home Oxygen: No.  Oxygen Delivery: room air  DISCHARGE LOCATION:   home   If you experience worsening of your admission symptoms, develop shortness of breath, life threatening emergency, suicidal or homicidal thoughts you must seek medical attention immediately by calling 911 or calling your MD immediately  if symptoms less severe.  You Must read complete instructions/literature along with all the possible adverse reactions/side effects for all the Medicines you take and that have been prescribed to you. Take any new Medicines after you have completely understood and accpet all the possible adverse reactions/side effects.   Please note  You were cared for by a hospitalist during your hospital stay. If you have any questions about your discharge medications or the care you received while you were in the hospital after you are discharged, you can call the unit and asked to speak with the hospitalist on call if the hospitalist that took care of you is not available. Once you are discharged, your primary care physician will handle any further medical issues. Please note that NO REFILLS for any discharge medications will be authorized once you are discharged, as it is imperative that you return to your primary care physician (or establish a relationship with a primary care physician if you do not have one) for your aftercare needs so that they can reassess your need for medications and monitor your lab values.    On the day of Discharge:  VITAL SIGNS:   Blood pressure (!) 158/77, pulse 81, temperature 98.6 F (37 C), temperature source Oral, resp. rate 16, height 5\' 8"  (1.727 m), weight 73.3 kg (161 lb 8 oz), SpO2 100 %.  PHYSICAL EXAMINATION:    GENERAL:  65 y.o.-year-old  patient lying in the bed with no acute distress.  EYES: Pupils equal, round, reactive to light and accommodation. No scleral icterus. Extraocular muscles intact.  HEENT: Head atraumatic, normocephalic. Oropharynx and nasopharynx clear.  NECK:  Supple, no jugular venous distention. No thyroid enlargement, no tenderness.  LUNGS: Normal breath sounds bilaterally, no wheezing, rales,rhonchi or crepitation. No use of accessory muscles of respiration.  CARDIOVASCULAR: S1, S2 normal. No  rubs, or gallops. 2/6 systolic murmur present. ABDOMEN: Soft, nontender, nondistended. Bowel sounds present. No organomegaly or mass.  EXTREMITIES: No pedal edema, cyanosis, or clubbing.  NEUROLOGIC: Cranial nerves II through XII are intact. Muscle strength 5/5 in all extremities. Sensation intact. Gait not checked.  PSYCHIATRIC: The patient is alert and oriented x 3.  SKIN: No obvious rash, lesion, or ulcer.     DATA REVIEW:   CBC  Recent Labs Lab 01/17/16 0350  WBC 10.9*  HGB 13.1  HCT 38.6*  PLT 245  Chemistries   Recent Labs Lab 01/14/16 0424 01/15/16 0431  01/17/16 0350  NA 136 137  < > 141  K 4.4 3.8  < > 4.0  CL 110 107  < > 111  CO2 21* 23  < > 25  GLUCOSE 134* 143*  < > 159*  BUN 36* 43*  < > 37*  CREATININE 1.85* 1.78*  < > 1.35*  CALCIUM 8.5* 8.5*  < > 8.7*  MG 1.8 2.0  --   --   AST 44*  --   --   --   ALT 39  --   --   --   ALKPHOS 84  --   --   --   BILITOT 0.2*  --   --   --   < > = values in this interval not displayed.   Microbiology Results  Results for orders placed or performed during the hospital encounter of 01/13/16  Culture, blood (routine x 2)     Status: None (Preliminary result)   Collection Time: 01/13/16  9:36 AM  Result Value Ref Range Status   Specimen Description BLOOD RIGHT ANTECUBITAL  Final   Special Requests BOTTLES DRAWN AEROBIC AND ANAEROBIC  7CC  Final   Culture NO GROWTH 4 DAYS  Final   Report Status PENDING  Incomplete  Culture, blood  (routine x 2)     Status: None (Preliminary result)   Collection Time: 01/13/16  9:36 AM  Result Value Ref Range Status   Specimen Description BLOOD RIGHT FOREARM  Final   Special Requests BOTTLES DRAWN AEROBIC AND ANAEROBIC 10CC  Final   Culture NO GROWTH 4 DAYS  Final   Report Status PENDING  Incomplete  Urine culture     Status: None   Collection Time: 01/13/16  9:36 AM  Result Value Ref Range Status   Specimen Description URINE, CATHETERIZED  Final   Special Requests Normal  Final   [facility]TH [facility]   Final   Report Status 01/14/2016 FINAL  Final  MRSA PCR Screening     Status: None   Collection Time: 01/13/16  1:46 PM  Result Value Ref Range Status   MRSA by PCR NEGATIVE NEGATIVE Final    Comment:        The GeneXpert MRSA Assay (FDA approved for NASAL specimens only), is one component of a comprehensive MRSA colonization surveillance program. It is not intended to diagnose MRSA infection nor to guide or monitor treatment for MRSA infections.     RADIOLOGY:  No results found.   Management plans discussed with the patient, family and they are in agreement.  CODE STATUS:     Code Status Orders        Start     Ordered   01/13/16 0938  Full code  Continuous     01/13/16 0942    Code Status History    Date Active Date Inactive Code Status Order ID Comments User Context   This patient has a current code status but no historical code status.      TOTAL TIME TAKING CARE OF THIS PATIENT: 37 minutes.    Nakyiah Kuck M.D on 01/17/2016 at 10:00 AM  Between 7am to 6pm - Pager - [phone]  After 6pm go to [email or web address] - Social research officer, [email or web address] [email or web address]  [facility] [place] [facility]  Office  [phone]  CC: Primary care physician; Emogene MorganAYCOCK, [name], MD   Note: This dictation was prepared with Dragon dictation along  with smaller phrase technology. Any transcriptional errors that result from this process are  unintentional.

## 2016-01-17 NOTE — Progress Notes (Signed)
Patient: Chaney BornJoel L Radu / Admit Date: 01/13/2016 / Date of Encounter: 01/17/2016, 8:34 AM   Subjective: No acute issues overnight. Status post staged PCI of the Om on 10/9. Cath site without issues. Labs stable. Awaiting LifeVest rep to call back for placement of vest prior to discharge.   Review of Systems: Review of Systems  Constitutional: Negative for chills, diaphoresis, fever, malaise/fatigue and weight loss.  HENT: Negative for congestion.   Eyes: Negative for discharge and redness.  Respiratory: Negative for cough, hemoptysis, sputum production, shortness of breath and wheezing.   Cardiovascular: Negative for chest pain, palpitations, orthopnea, claudication, leg swelling and PND.  Gastrointestinal: Negative for abdominal pain, blood in stool, heartburn, melena, nausea and vomiting.  Genitourinary: Negative for hematuria.  Musculoskeletal: Negative for falls and myalgias.  Skin: Negative for rash.  Neurological: Negative for dizziness, tingling, tremors, sensory change, speech change, focal weakness, loss of consciousness and weakness.  Endo/Heme/Allergies: Does not bruise/bleed easily.  Psychiatric/Behavioral: Negative for substance abuse. The patient is not nervous/anxious.   All other systems reviewed and are negative.   Objective: Telemetry: NSR, 80's bpm Physical Exam: Blood pressure (!) 158/77, pulse 81, temperature 98.6 F (37 C), temperature source Oral, resp. rate 16, height 5\' 8"  (1.727 m), weight 161 lb 8 oz (73.3 kg), SpO2 100 %. Body mass index is 24.56 kg/m. General: Well developed, well nourished, in no acute distress. Head: Normocephalic, atraumatic, sclera non-icteric, no xanthomas, nares are without discharge. Neck: Negative for carotid bruits. JVP not elevated. Lungs: Clear bilaterally to auscultation without wheezes, rales, or rhonchi. Breathing is unlabored. Heart: RRR S1 S2 without murmurs, rubs, or gallops.  Abdomen: Soft, non-tender, non-distended  with normoactive bowel sounds. No rebound/guarding. Extremities: No clubbing or cyanosis. No edema. Distal pedal pulses are 2+ and equal bilaterally. Right radial cath site without bleeding, bruising, swelling, TTP, or erythema. Distal pulse 2+.  Neuro: Alert and oriented X 3. Moves all extremities spontaneously. Psych:  Responds to questions appropriately with a normal affect.   Intake/Output Summary (Last 24 hours) at 01/17/16 0834 Last data filed at 01/17/16 0739  Gross per 24 hour  Intake                0 ml  Output              600 ml  Net             -600 ml    Inpatient Medications:  . aspirin  81 mg Oral Daily  . atorvastatin  80 mg Oral q1800  . carvedilol  12.5 mg Oral BID WC  . famotidine  20 mg Oral BID  . heparin  5,000 Units Subcutaneous Q8H  . insulin aspart  0-5 Units Subcutaneous QHS  . insulin aspart  0-9 Units Subcutaneous TID WC  . insulin glargine  10 Units Subcutaneous QHS  . lisinopril  20 mg Oral Daily  . nicotine  21 mg Transdermal Daily  . ticagrelor  90 mg Oral BID   Infusions:    Labs:  Recent Labs  01/15/16 0431 01/16/16 0416 01/17/16 0350  NA 137 139 141  K 3.8 3.6 4.0  CL 107 107 111  CO2 23 23 25   GLUCOSE 143* 197* 159*  BUN 43* 49* 37*  CREATININE 1.78* 1.78* 1.35*  CALCIUM 8.5* 8.9 8.7*  MG 2.0  --   --   PHOS 4.0  --   --    No results for input(s): AST, ALT, ALKPHOS,  BILITOT, PROT, ALBUMIN in the last 72 hours.  Recent Labs  01/15/16 0431 01/17/16 0350  WBC 15.7* 10.9*  HGB 12.2* 13.1  HCT 34.9* 38.6*  MCV 94.6 96.2  PLT 226 245   No results for input(s): CKTOTAL, CKMB, TROPONINI in the last 72 hours. Invalid input(s): POCBNP  Recent Labs  01/16/16 0417  HGBA1C 8.3*     Weights: Filed Weights   01/15/16 0400 01/16/16 0323 01/17/16 0411  Weight: 167 lb 8.8 oz (76 kg) 161 lb 14.4 oz (73.4 kg) 161 lb 8 oz (73.3 kg)     Radiology/Studies:  Dg Abd 1 View  Result Date: 01/13/2016 CLINICAL DATA:  Assess  position of the nasogastric tube EXAM: ABDOMEN - 1 VIEW COMPARISON:  CT scan of the chest of today's date FINDINGS: The nasogastric tube tip projects in the region of gastric fundus with the proximal port in the cardia. The bowel gas pattern is within the limits of normal. External pacemaker defibrillator pads are present over the cardiac silhouette. There is patchy increased density in the right infrahilar region which may reflect atelectasis or pneumonia. The endotracheal tube tip lies approximately 3.2 cm above the carina. There is calcification in the wall of the aortic arch. There is contrast within the renal collecting systems from CT scan of the chest earlier today. The IMPRESSION: The nasogastric tube tip lies in the gastric fundus with the proximal port in the cardia. Advancement by 5-10 cc would be useful. Thoracic aortic atherosclerosis. Electronically Signed   By: David  [place] M.D.   On: 01/13/2016 14:40   Ct Head Wo Contrast  Result Date: 01/13/2016 CLINICAL DATA:  Shortness of breath, worsening over the night. Altered mental status. EXAM: CT HEAD WITHOUT CONTRAST TECHNIQUE: Contiguous axial images were obtained from the base of the skull through the vertex without intravenous contrast. COMPARISON:  09/03/2012 FINDINGS: Brain: Mild atrophy. No acute intracranial abnormality. Specifically, no hemorrhage, hydrocephalus, mass lesion, acute infarction, or significant intracranial injury. Vascular: No hyperdense vessel or unexpected calcification. Skull: No acute calvarial abnormality. Sinuses/Orbits: Visualized paranasal sinuses and mastoids clear. Orbital soft tissues unremarkable. Other: None IMPRESSION: No acute intracranial abnormality. Electronically Signed   By: Charlett Nose M.D.   On: 01/13/2016 09:07   Dg Chest Port 1 View  Result Date: 01/15/2016 CLINICAL DATA:  Patient being seen for acute respiratory failure. Chronic kidney disease, diabetes. EXAM: PORTABLE CHEST 1 VIEW COMPARISON:   01/14/2016. FINDINGS: The heart remains enlarged. ET tube and NG tube have been removed. The patient has developed faint opacities at both bases, likely mild atelectasis post extubation. Continued surveillance warranted to exclude early infiltrates. No effusion or pneumothorax. Bones unremarkable. Thoracic atherosclerosis. IMPRESSION: Suspected post extubation subsegmental atelectasis at the bases. Electronically Signed   By: Elsie Stain M.D.   On: 01/15/2016 07:44   Dg Chest Port 1 View  Result Date: 01/14/2016 CLINICAL DATA:  Acute respiratory failure. Followup ventilator support. EXAM: PORTABLE CHEST 1 VIEW COMPARISON:  01/13/2016 FINDINGS: Endotracheal tube has its tip 2 cm above the carina. Nasogastric tube enters stomach. Infiltrate/ edema in both lower lobes persists but is slightly improved. No worsening or new finding. IMPRESSION: Slight radiographic improvement in infiltrate/ edema in both lower lobes. Electronically Signed   By: Paulina Fusi M.D.   On: 01/14/2016 07:03   Dg Chest Port 1 View  Result Date: 01/13/2016 CLINICAL DATA:  Shortness of breath.  Status post intubation. EXAM: PORTABLE CHEST 1 VIEW COMPARISON:  03/24/2015 FINDINGS: Endotracheal tube with the  tip 4.5 cm above the carina. Bilateral perihilar interstitial and alveolar airspace opacities with indistinctness of the central pulmonary vasculature. No significant pleural effusion. No pneumothorax. Stable cardiomediastinal silhouette. No acute osseous abnormality. IMPRESSION: 1. Endotracheal tube with the tip 4.5 cm above the carina. 2. Findings concerning for mild pulmonary edema. Electronically Signed   By: Elige Ko   On: 01/13/2016 09:21     Assessment and Plan  Principal Problem:   NSTEMI (non-ST elevated myocardial infarction) [facility]) Active Problems:   Acute respiratory failure (HCC)   CAD (coronary artery disease)   Essential hypertension   HLD (hyperlipidemia)    1. Subacute anterior MI: -Felt to be acute  on chronic occlusion of the LAD s/p PCI/DES to the mid LAD on 10/6  -Now status post staged PCI/DES to OM1 -Doing well -No chest pain or SOB -Continue DAPT with ASA and Brilinta -Continue high-dose statin -I have called the Life Vest rep [employer] Litts) [phone] to get patient set up for Life Vest -Will need to fax in H&P, cath, echo, progress note from today, and insurance info to evaluate for qualification   -Please do not discharge patient until we have Life Vest set up, thanks!  2. Acute systolic CHF: -EF 25-30% -Continue small dose lisinopril -Increase Coreg to 25 mg bid -Life Vest as above -Consider spironolactone  -CHF education -Cardiac rehab  3. HTN: -Coreg increased as above -Continue current medications  4. HLD: -Lipitor  5. PAD: -Asymptomatic   Signed, Eula Listen, PA-C [facility] Pager: [phone] 01/17/2016, 8:34 AM

## 2016-01-17 NOTE — Progress Notes (Signed)
     Damon LikesJoel [name] was admitted to the [facility] on 01/13/2016 for an acute cardiac condition and is being Discharged on  01/17/2016 . He is recovering and will not be able to attend work for at least 2 weeks after discharge. No exertional activity or lifting heavy weights until seen by cardiology in 2 weeks Should be able to return to work after cardiology evaluation in 2 weeks which will be after 01/31/2016.  Call Enid Baasadhika Shanaya Schneck  MD, [facility] at  [phone] with questions.  Enid BaasKALISETTI,[name] M.D on 01/17/2016,at 9:59 AM  [facility] [address], [place] [id number]

## 2016-01-17 NOTE — Care Management (Signed)
Patient is for discharge home today.  Faxed referral to [employer] for the Life Vest.  Provided Brilinta coupon.  Patient and his wife confirm patient has Nurse, learning disabilitycommercial insurance with medication coverage.  Patient remains on 02 which is acute.  Patient had heart cath and PCI 10.9 so will be more appropriate for this assessment today.

## 2016-01-17 NOTE — Care Management (Addendum)
Does not require home 02.  [employer] vest has been approved by AT[employer]patient's insurance

## 2016-01-17 NOTE — Progress Notes (Signed)
SATURATION QUALIFICATIONS: (This note is used to comply with regulatory documentation for home oxygen)  Patient Saturations on Room Air at Rest =100%  Patient Saturations on Room Air while Ambulating = 96%  Patient Saturations on 0Liters of oxygen while Ambulating =96%  Please briefly explain why patient needs home oxygen: 

## 2016-01-17 NOTE — Progress Notes (Signed)
Discharged to home. [employer] vest has been applied. IV and tele d/c'd. Taken out in wheelchair with family by CNA.

## 2016-01-18 ENCOUNTER — Telehealth: Payer: Self-pay | Admitting: Internal Medicine

## 2016-01-18 LAB — CULTURE, BLOOD (ROUTINE X 2)
CULTURE: NO GROWTH
Culture: NO GROWTH

## 2016-01-18 NOTE — Telephone Encounter (Signed)
[facility] and she just wanted to call and leave her contact information if needed her for anything. Fax [phone]

## 2016-01-19 NOTE — Telephone Encounter (Signed)
LM to advise Damon [name] that we have received her contact information and we will contact if needed. Nothing further needed.

## 2016-02-01 ENCOUNTER — Encounter: Payer: Self-pay | Admitting: Physician Assistant

## 2016-02-01 ENCOUNTER — Ambulatory Visit (INDEPENDENT_AMBULATORY_CARE_PROVIDER_SITE_OTHER): Payer: Commercial Managed Care - PPO | Admitting: Physician Assistant

## 2016-02-01 VITALS — BP 130/80 | HR 84 | Ht 70.0 in | Wt 160.0 lb

## 2016-02-01 DIAGNOSIS — N183 Chronic kidney disease, stage 3 unspecified: Secondary | ICD-10-CM

## 2016-02-01 DIAGNOSIS — I5022 Chronic systolic (congestive) heart failure: Secondary | ICD-10-CM

## 2016-02-01 DIAGNOSIS — I255 Ischemic cardiomyopathy: Secondary | ICD-10-CM | POA: Diagnosis not present

## 2016-02-01 DIAGNOSIS — I739 Peripheral vascular disease, unspecified: Secondary | ICD-10-CM

## 2016-02-01 DIAGNOSIS — I2510 Atherosclerotic heart disease of native coronary artery without angina pectoris: Secondary | ICD-10-CM

## 2016-02-01 DIAGNOSIS — I10 Essential (primary) hypertension: Secondary | ICD-10-CM | POA: Diagnosis not present

## 2016-02-01 MED ORDER — CARVEDILOL 25 MG PO TABS: 25.0000 mg | ORAL_TABLET | Freq: Two times a day (BID) | ORAL | 2 refills | Status: DC

## 2016-02-01 NOTE — Patient Instructions (Addendum)
Medication Instructions:  Your physician has recommended you make the following change in your medication:  INCREASE coreg to 25mg  twice daily   Labwork: BMET and CBC  Testing/Procedures: none  Follow-Up: Your physician recommends that you schedule a follow-up appointment with Dr. Kirke CorinArida in two months.   Any Other Special Instructions Will Be Listed Below (If Applicable).     If you need a refill on your cardiac medications before your next appointment, please call your pharmacy.

## 2016-02-01 NOTE — Progress Notes (Signed)
Cardiology Office Note Date:  02/01/2016  Patient ID:  Damon [name], Damon [name] 09/08/1950, MRN [id number] PCP:  Emogene Morgan, MD  Cardiologist:  Dr. Kirke Corin, MD    Chief Complaint: Hospital follow up  History of Present Illness: Damon [name] is a 65 y.o. male with history of recently diagnosed CAD s/p subacute anterior wall MI in the setting of a new LBBB and progressive dyspnea requiring intubation in early October 2017. Also with history of PAD s/p stenting, CKD stage III, DM, HTN, and tobacco abuse. He presents for hospital follow up of his recent MI and ICM.   Prior to this admission he did not have any previously known cardiac history. He does have a h/o claudication and PAD s/p bilateral iliac stenting in 10/2015. He was admitted in early October with increased SOB with the patient trying to get more air by sticking his head in the refrigerator. The handle broke off the refigerator and he fell forward, striking his head. EMS was called, found the patient hypoxic with O2 sats in teh 60's. He was placed on BiPAP and transferred to the ED. Upon arrival to the ED he was found to be hypertensive with a BP of 210/117, BNP elevated at 2233, initial troponin of 0.32 with a peak of 4.35. EKG showed LBBB of unknown duration. Stat echocardiogram showed and EF of 25-30%, anterior wall motion abnormalities, GR1DD, mild AI. Urgent cardiac cath was done with showed significant three-vessel CAD with the culprit lesion being an occluded mid LAD which was successfully treated with PCI/DES on 10/6. EF was 25-30% with moderatealy elevated LVEDP. His heart failure regimen was optimized, he was diuresed, and underwent staged PCI of the OM1 on 10/9. He was fitted with a LifeVest prior to discharge given his ICM.  He has done well since his discharge. Tolerating all medications without issues. Has not missed any doses of his medications. Weight stable, though not weighing himself daily. Stable, long-standing 2-pillow  orthopnea. No early satiety. No LE swelling. No SOB or chest pain. Has not felt LifeVest discharge or had any alarms. He does not apply salt to foods nor does he eat at restaurants. Drinks less than 2 L of fluids daily. Not yet back at work given he has a strenuous job in the above settings.    Past Medical History:  Diagnosis Date  . CKD (chronic kidney disease), stage III   . Diabetes mellitus without complication (HCC)   . Diverticulitis   . Essential hypertension   . PAD (peripheral artery disease) (HCC)    a. 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  . Tobacco abuse     Past Surgical History:  Procedure Laterality Date  . CARDIAC CATHETERIZATION N/A 01/13/2016   Procedure: LEFT HEART CATH AND CORONARY ANGIOGRAPHY;  Surgeon: Iran Ouch, MD;  Location: [facility] INVASIVE CV LAB;  Service: Cardiovascular;  Laterality: N/A;  . CARDIAC CATHETERIZATION N/A 01/13/2016   Procedure: Coronary Stent Intervention;  Surgeon: Iran Ouch, MD;  Location: [facility] INVASIVE CV LAB;  Service: Cardiovascular;  Laterality: N/A;  . CARDIAC CATHETERIZATION N/A 01/16/2016   Procedure: Left Heart Cath and Coronary Angiography;  Surgeon: Iran Ouch, MD;  Location: [facility] INVASIVE CV LAB;  Service: Cardiovascular;  Laterality: N/A;  . NECK SURGERY    . PERIPHERAL VASCULAR CATHETERIZATION Left 11/01/2015   Procedure: Lower Extremity Angiography;  Surgeon: Renford Dills, MD;  Location: [facility] INVASIVE CV LAB;  Service: Cardiovascular;  Laterality: Left;    Current Outpatient Prescriptions  Medication Sig Dispense Refill  . aspirin EC 81 MG tablet Take 1 tablet (81 mg total) by mouth daily. 150 tablet 2  . atorvastatin (LIPITOR) 80 MG tablet Take 1 tablet (80 mg total) by mouth daily at 6 PM. 30 tablet 2  . carvedilol (COREG) 12.5 MG tablet Take 1 tablet (12.5 mg total) by mouth 2 (two) times daily with a meal. 60 tablet 2  . furosemide (LASIX) 20 MG tablet Take 1  tablet (20 mg total) by mouth daily. 30 tablet 2  . gabapentin (NEURONTIN) 300 MG capsule Take 300 mg by mouth 2 (two) times daily.    . Insulin Glargine (LANTUS SOLOSTAR) 100 UNIT/ML Solostar Pen Inject 30 Units into the skin daily.     Marland Kitchen. lisinopril (PRINIVIL,ZESTRIL) 20 MG tablet Take 1 tablet (20 mg total) by mouth daily. 30 tablet 2  . Multiple Vitamin (MULTIVITAMIN WITH MINERALS) TABS tablet Take 1 tablet by mouth daily.    Marland Kitchen. spironolactone (ALDACTONE) 25 MG tablet Take 1 tablet (25 mg total) by mouth daily. 30 tablet 2  . ticagrelor (BRILINTA) 90 MG TABS tablet Take 1 tablet (90 mg total) by mouth 2 (two) times daily. 60 tablet 2  . vitamin B-12 (CYANOCOBALAMIN) 1000 MCG tablet Take 1,000 mcg by mouth daily.    . vitamin E 100 UNIT capsule Take 100 Units by mouth daily.     No current facility-administered medications for this visit.     Allergies:   Review of patient's allergies indicates no known allergies.   Social History:  The patient  reports that he has been smoking.  He uses smokeless tobacco. He reports that he does not drink alcohol or use drugs.   Family History:  The patient's family history is not on file.  ROS:   Review of Systems  Constitutional: Positive for malaise/fatigue. Negative for chills, diaphoresis, fever and weight loss.  HENT: Negative for congestion.   Eyes: Negative for discharge and redness.  Respiratory: Negative for cough, hemoptysis, sputum production, shortness of breath and wheezing.   Cardiovascular: Negative for chest pain, palpitations, orthopnea, claudication, leg swelling and PND.  Gastrointestinal: Negative for abdominal pain, blood in stool, heartburn, melena, nausea and vomiting.  Genitourinary: Negative for hematuria.  Musculoskeletal: Negative for falls and myalgias.  Skin: Negative for rash.  Neurological: Negative for dizziness, tingling, tremors, sensory change, speech change, focal weakness, loss of consciousness and weakness.    Endo/Heme/Allergies: Does not bruise/bleed easily.  Psychiatric/Behavioral: Negative for substance abuse. The patient is not nervous/anxious.   All other systems reviewed and are negative.    PHYSICAL EXAM:  VS:  BP 130/80 (BP Location: Left Arm, Patient Position: Sitting, Cuff Size: Normal)   Pulse 84   Ht 5\' 10"  (1.778 m)   Wt 160 lb (72.6 kg)   BMI 22.96 kg/m  BMI: Body mass index is 22.96 kg/m.  Physical Exam  Constitutional: He is oriented to person, place, and time. He appears well-developed and well-nourished.  HENT:  Head: Normocephalic and atraumatic.  Eyes: Right eye exhibits no discharge. Left eye exhibits no discharge.  Neck: Normal range of motion. No JVD present.  Cardiovascular: Normal rate, regular rhythm, S1 normal, S2 normal and normal heart sounds.  Exam reveals no distant heart sounds, no friction rub, no midsystolic click and no opening snap.   No murmur heard. LifeVest in place  Pulmonary/Chest: Effort normal and breath sounds normal. No respiratory distress.  He has no decreased breath sounds. He has no wheezes. He has no rales. He exhibits no tenderness.  Abdominal: Soft. He exhibits no distension. There is no tenderness.  Musculoskeletal: He exhibits no edema.  Neurological: He is alert and oriented to person, place, and time.  Skin: Skin is warm and dry. No cyanosis. Nails show no clubbing.  Psychiatric: He has a normal mood and affect. His speech is normal and behavior is normal. Judgment and thought content normal.    EKG:  Was ordered and interpreted by me today. Shows NSR, 84 bpm, incomplete RBBB, left anterior fascicular block, bifascicular block, poor R wave progression, prolonged QTc at 505 msec, nonspecific lateral st/t changes   Recent Labs: 01/13/2016: B Natriuretic Peptide 2,233.0 01/14/2016: ALT 39 01/15/2016: Magnesium 2.0 01/17/2016: BUN 37; Creatinine, Ser 1.35; Hemoglobin 13.1; Platelets 245; Potassium 4.0; Sodium 141  No results found for  requested labs within last 8760 hours.   Estimated Creatinine Clearance: 57.5 mL/min (by C-G formula based on SCr of 1.35 mg/dL (H)).   Wt Readings from Last 3 Encounters:  02/01/16 160 lb (72.6 kg)  01/17/16 161 lb 8 oz (73.3 kg)  11/01/15 175 lb (79.4 kg)     Other studies reviewed: Additional studies/records reviewed today include: summarized above  ASSESSMENT AND PLAN:  1. CAD as above: Currently, without symptoms concerning for angina. Continue DAPT with ASA 81 mg daily and Brilinta 90 mg bid for at least 12 month s/p PCI in early October 2017. Cardiac rehab after the first of the year once he has undergone the 90-day period with his LifeVest and we have repeated an echo to evaluate for possible improvement in LVSF. No plans for ischemic evaluation at this time.   2. Chronic systolic CHF/ICM: He does not appear to be volume overloaded at this time. Increase Coreg to 25 mg bid. Continue lisinopril 20 mg, spironolactone 25 mg daily, and Lasix 20 mg daily. Will not have him hold ACEi for washout period for Entresto until we have evaluated his EF post intervention as above. Should his EF remain low, would then plan to start Entresto after 36 hour washout period of ACEi. Continue LifeVest. No current discharges. Plan to recheck echo first part of January to evaluate for improved EF. Should his EF remain below 35% will refer to EP for evaluation of ICD implantation. If his EF improves to > 35% at his follow up echo can discontinue LifeVest and continue to optimize medications as above. CHF education provided in detail. Weight daily, call for weight gain > 3 pounds overnight or 5 pounds in a week. Limit salt and PO fluid intake. Continue to be out of work until recheck echo given he has a strenuous job. Advance daily activities as tolerated.   3. PAD: No symptoms of claudication at this time.   4. CKD stage III: Check bmet.  5. HTN: Well controlled currently. Continue current medications as  above.  6. Tobacco abuse: Cessation advised. He reports he plans to start nicotine gum today.   Disposition: F/u with Dr. Kirke Corin, MD in 2 months.   Current medicines are reviewed at length with the patient today.  The patient did not have any concerns regarding medicines.  Elinor Dodge PA-C 02/01/2016 3:01 PM     [facility] [place] [address] [place], [place] [address] [phone]

## 2016-02-02 ENCOUNTER — Other Ambulatory Visit: Payer: Self-pay

## 2016-02-02 DIAGNOSIS — I10 Essential (primary) hypertension: Secondary | ICD-10-CM

## 2016-02-02 LAB — BASIC METABOLIC PANEL
BUN/Creatinine Ratio: 13 (ref 10–24)
BUN: 23 mg/dL (ref 8–27)
CALCIUM: 9.9 mg/dL (ref 8.6–10.2)
CO2: 18 mmol/L (ref 18–29)
CREATININE: 1.72 mg/dL — AB (ref 0.76–1.27)
Chloride: 108 mmol/L — ABNORMAL HIGH (ref 96–106)
GFR, EST AFRICAN AMERICAN: 48 mL/min/{1.73_m2} — AB (ref >59)
GFR, EST NON AFRICAN AMERICAN: 41 mL/min/{1.73_m2} — AB (ref >59)
Glucose: 278 mg/dL — ABNORMAL HIGH (ref 65–99)
POTASSIUM: 5.4 mmol/L — AB (ref 3.5–5.2)
SODIUM: 144 mmol/L (ref 134–144)

## 2016-02-02 LAB — CBC

## 2016-02-06 ENCOUNTER — Other Ambulatory Visit: Payer: Commercial Managed Care - PPO

## 2016-02-06 ENCOUNTER — Other Ambulatory Visit
Admission: RE | Admit: 2016-02-06 | Discharge: 2016-02-06 | Disposition: A | Discharge status: Home / Self Care | Payer: Commercial Managed Care - PPO | Source: Ambulatory Visit | Attending: Physician Assistant | Admitting: Physician Assistant

## 2016-02-06 ENCOUNTER — Encounter: Payer: Commercial Managed Care - PPO | Attending: Cardiovascular Disease | Admitting: *Deleted

## 2016-02-06 VITALS — Ht 68.6 in | Wt 159.1 lb

## 2016-02-06 DIAGNOSIS — E1122 Type 2 diabetes mellitus with diabetic chronic kidney disease: Secondary | ICD-10-CM | POA: Diagnosis not present

## 2016-02-06 DIAGNOSIS — I1310 Hypertensive heart and chronic kidney disease without heart failure, with stage 1 through stage 4 chronic kidney disease, or unspecified chronic kidney disease: Secondary | ICD-10-CM | POA: Insufficient documentation

## 2016-02-06 DIAGNOSIS — I739 Peripheral vascular disease, unspecified: Secondary | ICD-10-CM | POA: Diagnosis not present

## 2016-02-06 DIAGNOSIS — N183 Chronic kidney disease, stage 3 (moderate): Secondary | ICD-10-CM | POA: Insufficient documentation

## 2016-02-06 DIAGNOSIS — I214 Non-ST elevation (NSTEMI) myocardial infarction: Secondary | ICD-10-CM | POA: Insufficient documentation

## 2016-02-06 DIAGNOSIS — Z955 Presence of coronary angioplasty implant and graft: Secondary | ICD-10-CM | POA: Diagnosis present

## 2016-02-06 DIAGNOSIS — I10 Essential (primary) hypertension: Secondary | ICD-10-CM | POA: Diagnosis not present

## 2016-02-06 DIAGNOSIS — F17210 Nicotine dependence, cigarettes, uncomplicated: Secondary | ICD-10-CM | POA: Diagnosis not present

## 2016-02-06 LAB — BASIC METABOLIC PANEL
Anion gap: 9 (ref 5–15)
BUN: 23 mg/dL — AB (ref 6–20)
CALCIUM: 9.2 mg/dL (ref 8.9–10.3)
CO2: 24 mmol/L (ref 22–32)
CREATININE: 1.45 mg/dL — AB (ref 0.61–1.24)
Chloride: 104 mmol/L (ref 101–111)
GFR calc non Af Amer: 50 mL/min — ABNORMAL LOW (ref >60)
GFR, EST AFRICAN AMERICAN: 58 mL/min — AB (ref >60)
GLUCOSE: 300 mg/dL — AB (ref 65–99)
Potassium: 3.7 mmol/L (ref 3.5–5.1)
Sodium: 137 mmol/L (ref 135–145)

## 2016-02-06 LAB — CBC WITH DIFFERENTIAL/PLATELET
BASOS PCT: 1 %
Basophils Absolute: 0 10*3/uL (ref 0–0.1)
Eosinophils Absolute: 0.2 10*3/uL (ref 0–0.7)
Eosinophils Relative: 2 %
HEMATOCRIT: 38 % — AB (ref 40.0–52.0)
Hemoglobin: 13.3 g/dL (ref 13.0–18.0)
Lymphocytes Relative: 22 %
Lymphs Abs: 2 10*3/uL (ref 1.0–3.6)
MCH: 32.6 pg (ref 26.0–34.0)
MCHC: 35.1 g/dL (ref 32.0–36.0)
MCV: 92.8 fL (ref 80.0–100.0)
MONO ABS: 0.4 10*3/uL (ref 0.2–1.0)
MONOS PCT: 4 %
NEUTROS ABS: 6.3 10*3/uL (ref 1.4–6.5)
Neutrophils Relative %: 71 %
Platelets: 292 10*3/uL (ref 150–440)
RBC: 4.1 MIL/uL — ABNORMAL LOW (ref 4.40–5.90)
RDW: 12.8 % (ref 11.5–14.5)
WBC: 8.9 10*3/uL (ref 3.8–10.6)

## 2016-02-06 NOTE — Patient Instructions (Signed)
Patient Instructions  Patient Details  Name: Damon [name] MRN: [id number] Date of Birth: 07/06/1950 Referring Provider:  Iran OuchArida, Muhammad A, MD  Below are the personal goals you chose as well as exercise and nutrition goals. Our goal is to help you keep on track towards obtaining and maintaining your goals. We will be discussing your progress on these goals with you throughout the program.  Initial Exercise Prescription:     Initial Exercise Prescription - 02/06/16 1500      Date of Initial Exercise RX and Referring Provider   Date 02/06/16   Referring Provider Lorine BearsArida, Muhammad MD     Recumbant Bike   Level 1   RPM 35   Minutes 15   METs 2     NuStep   Level 1   Watts --  60-80 spm   Minutes 15   METs 2     Biostep-RELP   Level 1   Watts --  40-50 spm   Minutes 15   METs 2     Prescription Details   Frequency (times per week) 2   Duration Progress to 45 minutes of aerobic exercise without signs/symptoms of physical distress     Intensity   THRR 40-80% of Max Heartrate 115-143   Ratings of Perceived Exertion 11-15   Perceived Dyspnea 0-4     Progression   Progression Continue to progress workloads to maintain intensity without signs/symptoms of physical distress.     Resistance Training   Training Prescription Yes   Weight 2 lbs   Reps 10-12      Exercise Goals: Frequency: Be able to perform aerobic exercise three times per week working toward 3-5 days per week.  Intensity: Work with a perceived exertion of 11 (fairly light) - 15 (hard) as tolerated. Follow your new exercise prescription and watch for changes in prescription as you progress with the program. Changes will be reviewed with you when they are made.  Duration: You should be able to do 30 minutes of continuous aerobic exercise in addition to a 5 minute warm-up and a 5 minute cool-down routine.  Nutrition Goals: Your personal nutrition goals will be established when you do your nutrition analysis  with the dietician.  The following are nutrition guidelines to follow: Cholesterol < 200mg /day Sodium < 1500mg /day Fiber: Men over 50 yrs - 30 grams per day  Personal Goals:     Personal Goals and Risk Factors at Admission - 02/06/16 1448      Core Components/Risk Factors/Patient Goals on Admission    Weight Management Yes;Weight Maintenance   Intervention Weight Management: Develop a combined nutrition and exercise program designed to reach desired caloric intake, while maintaining appropriate intake of nutrient and fiber, sodium and fats, and appropriate energy expenditure required for the weight goal.;Weight Management: Provide education and appropriate resources to help participant work on and attain dietary goals.   Admit Weight 159 lb 1.6 oz (72.2 kg)   Expected Outcomes Short Term: Continue to assess and modify interventions until short term weight is achieved;Long Term: Adherence to nutrition and physical activity/exercise program aimed toward attainment of established weight goal;Weight Maintenance: Understanding of the daily nutrition guidelines, which includes 25-35% calories from fat, 7% or less cal from saturated fats, less than 200mg  cholesterol, less than 1.5gm of sodium, & 5 or more servings of fruits and vegetables daily   Sedentary Yes   Intervention Provide advice, education, support and counseling about physical activity/exercise needs.;Develop an individualized exercise prescription for aerobic and  resistive training based on initial evaluation findings, risk stratification, comorbidities and participant's personal goals.   Expected Outcomes Achievement of increased cardiorespiratory fitness and enhanced flexibility, muscular endurance and strength shown through measurements of functional capacity and personal statement of participant.   Increase Strength and Stamina Yes   Intervention Provide advice, education, support and counseling about physical activity/exercise  needs.;Develop an individualized exercise prescription for aerobic and resistive training based on initial evaluation findings, risk stratification, comorbidities and participant's personal goals.   Expected Outcomes Achievement of increased cardiorespiratory fitness and enhanced flexibility, muscular endurance and strength shown through measurements of functional capacity and personal statement of participant.   Tobacco Cessation Yes   Intervention Assist the participant in steps to quit. Provide individualized education and counseling about committing to Tobacco Cessation, relapse prevention, and pharmacological support that can be provided by physician.;Education officer, environmentalffer self-teaching materials, assist with locating and accessing local/national Quit Smoking programs, and support quit date choice.   Expected Outcomes Short Term: Will demonstrate readiness to quit, by selecting a quit date.;Short Term: Will quit all tobacco product use, adhering to prevention of relapse plan.;Long Term: Complete abstinence from all tobacco products for at least 12 months from quit date.  Quit date set to be 02/12/2016. Has patches and nicotine gum available to help with cessation of tobacco.   Diabetes Yes   Intervention Provide education about proper nutrition, including hydration, and aerobic/resistive exercise prescription along with prescribed medications to achieve blood glucose in normal ranges: Fasting glucose 65-99 mg/dL;Provide education about signs/symptoms and action to take for hypo/hyperglycemia.   Expected Outcomes Short Term: Participant verbalizes understanding of the signs/symptoms and immediate care of hyper/hypoglycemia, proper foot care and importance of medication, aerobic/resistive exercise and nutrition plan for blood glucose control.;Long Term: Attainment of HbA1C < 7%.   Hypertension Yes   Intervention Provide education on lifestyle modifcations including regular physical activity/exercise, weight management,  moderate sodium restriction and increased consumption of fresh fruit, vegetables, and low fat dairy, alcohol moderation, and smoking cessation.;Monitor prescription use compliance.   Expected Outcomes Short Term: Continued assessment and intervention until BP is < 140/7390mm HG in hypertensive participants. < 130/2380mm HG in hypertensive participants with diabetes, heart failure or chronic kidney disease.;Long Term: Maintenance of blood pressure at goal levels.   Lipids Yes   Intervention Provide education and support for participant on nutrition & aerobic/resistive exercise along with prescribed medications to achieve LDL 70mg , HDL >40mg .   Expected Outcomes Short Term: Participant states understanding of desired cholesterol values and is compliant with medications prescribed. Participant is following exercise prescription and nutrition guidelines.;Long Term: Cholesterol controlled with medications as prescribed, with individualized exercise RX and with personalized nutrition plan. Value goals: LDL < 70mg , HDL > 40 mg.      Tobacco Use Initial Evaluation: History  Smoking Status  . Current Every Day Smoker  Smokeless Tobacco  . Current User    Copy of goals given to participant.

## 2016-02-06 NOTE — Addendum Note (Signed)
Addended by: Juleen StarrBRACKETT, Gabreille Dardis L on: 02/06/2016 11:52 AM   Modules accepted: Orders

## 2016-02-06 NOTE — Progress Notes (Signed)
Cardiac Individual Treatment Plan  Patient Details  Name: Damon [name] MRN: [id number] Date of Birth: 19-Feb-1951 Referring Provider:   Flowsheet Row Cardiac Rehab from 02/06/2016 in [facility] Cardiac and Pulmonary Rehab  Referring Provider  Lorine Bears MD      Initial Encounter Date:  Flowsheet Row Cardiac Rehab from 02/06/2016 in [facility] Cardiac and Pulmonary Rehab  Date  02/06/16  Referring Provider  Lorine Bears MD      Visit Diagnosis: NSTEMI (non-ST elevated myocardial infarction) [facility])  Status post coronary artery stent placement  Patient's Home Medications on Admission:  Current Outpatient Prescriptions:  .  aspirin EC 81 MG tablet, Take 1 tablet (81 mg total) by mouth daily., Disp: 150 tablet, Rfl: 2 .  atorvastatin (LIPITOR) 80 MG tablet, Take 1 tablet (80 mg total) by mouth daily at 6 PM., Disp: 30 tablet, Rfl: 2 .  carvedilol (COREG) 25 MG tablet, Take 1 tablet (25 mg total) by mouth 2 (two) times daily with a meal., Disp: 60 tablet, Rfl: 2 .  furosemide (LASIX) 20 MG tablet, Take 1 tablet (20 mg total) by mouth daily., Disp: 30 tablet, Rfl: 2 .  gabapentin (NEURONTIN) 300 MG capsule, Take 300 mg by mouth 2 (two) times daily., Disp: , Rfl:  .  Insulin Glargine (LANTUS SOLOSTAR) 100 UNIT/ML Solostar Pen, Inject 30 Units into the skin daily. , Disp: , Rfl:  .  lisinopril (PRINIVIL,ZESTRIL) 20 MG tablet, Take 1 tablet (20 mg total) by mouth daily., Disp: 30 tablet, Rfl: 2 .  Multiple Vitamin (MULTIVITAMIN WITH MINERALS) TABS tablet, Take 1 tablet by mouth daily., Disp: , Rfl:  .  spironolactone (ALDACTONE) 25 MG tablet, Take 1 tablet (25 mg total) by mouth daily., Disp: 30 tablet, Rfl: 2 .  ticagrelor (BRILINTA) 90 MG TABS tablet, Take 1 tablet (90 mg total) by mouth 2 (two) times daily., Disp: 60 tablet, Rfl: 2 .  vitamin B-12 (CYANOCOBALAMIN) 1000 MCG tablet, Take 1,000 mcg by mouth daily., Disp: , Rfl:  .  vitamin E 100 UNIT capsule, Take 100 Units by mouth daily., Disp:  , Rfl:   Past Medical History: Past Medical History:  Diagnosis Date  . CKD (chronic kidney disease), stage III   . Diabetes mellitus without complication (HCC)   . Diverticulitis   . Essential hypertension   . PAD (peripheral artery disease) (HCC)    a. 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  . Tobacco abuse     Tobacco Use: History  Smoking Status  . Current Every Day Smoker  Smokeless Tobacco  . Current User    Labs: Recent Review Flowsheet Data    Labs for ITP Cardiac and Pulmonary Rehab Latest Ref Rng & Units 11/16/2014 01/13/2016 01/14/2016 01/16/2016   Hemoglobin A1c 4.8 - 5.6 % - - - 8.3(H)   PHART 7.350 - 7.450 - 7.16(LL) 7.40 -   PCO2ART 32.0 - 48.0 mmHg - 57(H) 33 -   HCO3 20.0 - 28.0 mmol/L 22.0 20.3 20.4 -   ACIDBASEDEF 0.0 - 2.0 mmol/L 3.1(H) 8.9(H) 3.5(H) -   O2SAT % - 99.9 98.1 -       Exercise Target Goals: Date: 02/06/16  Exercise Program Goal: Individual exercise prescription set with THRR, safety & activity barriers. Participant demonstrates ability to understand and report RPE using BORG scale, to self-measure pulse accurately, and to acknowledge the importance of the exercise prescription.  Exercise Prescription Goal: Starting with aerobic activity 30 plus minutes  a day, 3 days per week for initial exercise prescription. Provide home exercise prescription and guidelines that participant acknowledges understanding prior to discharge.  Activity Barriers & Risk Stratification:     Activity Barriers & Cardiac Risk Stratification - 02/06/16 1453      Activity Barriers & Cardiac Risk Stratification   Activity Barriers Shortness of Breath;Balance Concerns;History of Falls;Assistive Device;Deconditioning;Muscular Weakness;Decreased Ventricular Function  wears a life vest, weight of battery pack sometimes makes [place] feel off balance, scar tissue on bottom of feet, gait unsteady with shuffling/toe catching on swing  through of left leg   Cardiac Risk Stratification High      6 Minute Walk:     6 Minute Walk    Row Name 02/06/16 1555         6 Minute Walk   Phase Initial     Distance 260 feet     Walk Time 3.1 minutes     # of Rest Breaks 1  3:31-5:25     MPH 0.98     METS 1.73     RPE 17     Perceived Dyspnea  4     VO2 Peak 6.08     Symptoms Yes (comment)     Comments leg fatigue, SOB, drags left foot causes stumbles in gait     Resting HR 87 bpm     Resting BP 136/66     Max Ex. HR 104 bpm     Max Ex. BP 128/60     2 Minute Post BP 124/70       Interval HR   Baseline HR 87     1 Minute HR 96     2 Minute HR 96     3 Minute HR 101     4 Minute HR 98     5 Minute HR 96     6 Minute HR 104     2 Minute Post HR 85     Interval Heart Rate? Yes       Interval Oxygen   Interval Oxygen? Yes     Baseline Oxygen Saturation % 99 %     Baseline Liters of Oxygen 0 L  Room Air     1 Minute Oxygen Saturation % 100 %     1 Minute Liters of Oxygen 0 L     2 Minute Oxygen Saturation % 99 %     2 Minute Liters of Oxygen 0 L     3 Minute Oxygen Saturation % 97 %     3 Minute Liters of Oxygen 0 L     4 Minute Oxygen Saturation % 98 %     4 Minute Liters of Oxygen 0 L     6 Minute Oxygen Saturation % 99 %     6 Minute Liters of Oxygen 0 L        Initial Exercise Prescription:     Initial Exercise Prescription - 02/06/16 1500      Date of Initial Exercise RX and Referring Provider   Date 02/06/16   Referring Provider Lorine Bears MD     Recumbant Bike   Level 1   RPM 35   Minutes 15   METs 2     NuStep   Level 1   Watts --  60-80 spm   Minutes 15   METs 2     Biostep-RELP   Level 1   Watts --  40-50 spm   Minutes 15  METs 2     Prescription Details   Frequency (times per week) 2   Duration Progress to 45 minutes of aerobic exercise without signs/symptoms of physical distress     Intensity   THRR 40-80% of Max Heartrate 115-143   Ratings of Perceived  Exertion 11-15   Perceived Dyspnea 0-4     Progression   Progression Continue to progress workloads to maintain intensity without signs/symptoms of physical distress.     Resistance Training   Training Prescription Yes   Weight 2 lbs   Reps 10-12      Perform Capillary Blood Glucose checks as needed.  Exercise Prescription Changes:     Exercise Prescription Changes    Row Name 02/06/16 1400             Exercise Review   Progression -  Walk Test Results         Response to Exercise   Blood Pressure (Admit) 136/66       Blood Pressure (Exercise) 128/60       Blood Pressure (Exit) 124/70       Heart Rate (Admit) 87 bpm       Heart Rate (Exercise) 104 bpm       Heart Rate (Exit) 85 bpm       Oxygen Saturation (Admit) 99 %       Oxygen Saturation (Exercise) 99 %       Rating of Perceived Exertion (Exercise) 17       Perceived Dyspnea (Exercise) 4       Symptoms Leg fatigue, SOB, tripping with dragged left foot          Exercise Comments:     Exercise Comments    Row Name 02/06/16 1559           Exercise Comments Francis DowseJoel wants to be able to walk better without SOB, improve his strength and stamina, and get back to fishing and doing things around the house.          Discharge Exercise Prescription (Final Exercise Prescription Changes):     Exercise Prescription Changes - 02/06/16 1400      Exercise Review   Progression --  Walk Test Results     Response to Exercise   Blood Pressure (Admit) 136/66   Blood Pressure (Exercise) 128/60   Blood Pressure (Exit) 124/70   Heart Rate (Admit) 87 bpm   Heart Rate (Exercise) 104 bpm   Heart Rate (Exit) 85 bpm   Oxygen Saturation (Admit) 99 %   Oxygen Saturation (Exercise) 99 %   Rating of Perceived Exertion (Exercise) 17   Perceived Dyspnea (Exercise) 4   Symptoms Leg fatigue, SOB, tripping with dragged left foot      Nutrition:  Target Goals: Understanding of nutrition guidelines, daily intake of sodium  1500mg , cholesterol 200mg , calories 30% from fat and 7% or less from saturated fats, daily to have 5 or more servings of fruits and vegetables.  Biometrics:     Pre Biometrics - 02/06/16 1600      Pre Biometrics   Height 5' 8.6" (1.742 m)   Weight 159 lb 1.6 oz (72.2 kg)   Waist Circumference 35 inches   Hip Circumference 37.5 inches   Waist to Hip Ratio 0.93 %   BMI (Calculated) 23.8   Single Leg Stand 0.89 seconds       Nutrition Therapy Plan and Nutrition Goals:     Nutrition Therapy & Goals - 02/06/16 1447  Intervention Plan   Intervention Prescribe, educate and counsel regarding individualized specific dietary modifications aiming towards targeted core components such as weight, hypertension, lipid management, diabetes, heart failure and other comorbidities.   Expected Outcomes Short Term Goal: Understand basic principles of dietary content, such as calories, fat, sodium, cholesterol and nutrients.;Short Term Goal: A plan has been developed with personal nutrition goals set during dietitian appointment.;Long Term Goal: Adherence to prescribed nutrition plan.      Nutrition Discharge: Rate Your Plate Scores:     Nutrition Assessments - 02/06/16 1448      Rate Your Plate Scores   Pre Score 56   Pre Score % 62 %      Nutrition Goals Re-Evaluation:   Psychosocial: Target Goals: Acknowledge presence or absence of depression, maximize coping skills, provide positive support system. Participant is able to verbalize types and ability to use techniques and skills needed for reducing stress and depression.  Initial Review & Psychosocial Screening:     Initial Psych Review & Screening - 02/06/16 1450      Initial Review   Current issues with Current Depression  States a bit of depression noted.  Starts the day feeling fine and gets a bit depressed later in the day. Hopes that getting started with exercise routine will help him feel better throughout the day.      Family Dynamics   Good Support System? Yes  Wife     Barriers   Psychosocial barriers to participate in program There are no identifiable barriers or psychosocial needs.;The patient should benefit from training in stress management and relaxation.     Screening Interventions   Interventions Encouraged to exercise      Quality of Life Scores:     Quality of Life - 02/06/16 1452      Quality of Life Scores   Health/Function Pre 18.87 %   Socioeconomic Pre 19.5 %   Psych/Spiritual Pre 20 %   Family Pre 20.25 %   GLOBAL Pre 19.41 %      PHQ-9: Recent Review Flowsheet Data    Depression screen PHQ 2/9 02/06/2016   Decreased Interest 1   Down, Depressed, Hopeless 1   PHQ - 2 Score 2   Altered sleeping 3    Tired, decreased energy 3   Change in appetite 2   Feeling bad or failure about yourself  2   Trouble concentrating 2   Moving slowly or fidgety/restless 3   Suicidal thoughts 0   PHQ-9 Score 17   Difficult doing work/chores Somewhat difficult      Psychosocial Evaluation and Intervention:   Psychosocial Re-Evaluation:   Vocational Rehabilitation: Provide vocational rehab assistance to qualifying candidates.   Vocational Rehab Evaluation & Intervention:     Vocational Rehab - 02/06/16 1455      Initial Vocational Rehab Evaluation & Intervention   Assessment shows need for Vocational Rehabilitation No      Education: Education Goals: Education classes will be provided on a weekly basis, covering required topics. Participant will state understanding/return demonstration of topics presented.  Learning Barriers/Preferences:     Learning Barriers/Preferences - 02/06/16 1454      Learning Barriers/Preferences   Learning Barriers Inability to learn new things;Reading   Learning Preferences Group Instruction;Individual Instruction;Video      Education Topics: General Nutrition Guidelines/Fats and Fiber: -Group instruction provided by verbal,  written material, models and posters to present the general guidelines for heart healthy nutrition. Gives an explanation and review of dietary  fats and fiber.   Controlling Sodium/Reading Food Labels: -Group verbal and written material supporting the discussion of sodium use in heart healthy nutrition. Review and explanation with models, verbal and written materials for utilization of the food label.   Exercise Physiology & Risk Factors: - Group verbal and written instruction with models to review the exercise physiology of the cardiovascular system and associated critical values. Details cardiovascular disease risk factors and the goals associated with each risk factor.   Aerobic Exercise & Resistance Training: - Gives group verbal and written discussion on the health impact of inactivity. On the components of aerobic and resistive training programs and the benefits of this training and how to safely progress through these programs.   Flexibility, Balance, General Exercise Guidelines: - Provides group verbal and written instruction on the benefits of flexibility and balance training programs. Provides general exercise guidelines with specific guidelines to those with heart or lung disease. Demonstration and skill practice provided.   Stress Management: - Provides group verbal and written instruction about the health risks of elevated stress, cause of high stress, and healthy ways to reduce stress.   Depression: - Provides group verbal and written instruction on the correlation between heart/lung disease and depressed mood, treatment options, and the stigmas associated with seeking treatment.   Anatomy & Physiology of the Heart: - Group verbal and written instruction and models provide basic cardiac anatomy and physiology, with the coronary electrical and arterial systems. Review of: AMI, Angina, Valve disease, Heart Failure, Cardiac Arrhythmia, Pacemakers, and the ICD.   Cardiac  Procedures: - Group verbal and written instruction and models to describe the testing methods done to diagnose heart disease. Reviews the outcomes of the test results. Describes the treatment choices: Medical Management, Angioplasty, or Coronary Bypass Surgery.   Cardiac Medications: - Group verbal and written instruction to review commonly prescribed medications for heart disease. Reviews the medication, class of the drug, and side effects. Includes the steps to properly store meds and maintain the prescription regimen.   Go Sex-Intimacy & Heart Disease, Get SMART - Goal Setting: - Group verbal and written instruction through game format to discuss heart disease and the return to sexual intimacy. Provides group verbal and written material to discuss and apply goal setting through the application of the S.M.A.R.T. Method.   Other Matters of the Heart: - Provides group verbal, written materials and models to describe Heart Failure, Angina, Valve Disease, and Diabetes in the realm of heart disease. Includes description of the disease process and treatment options available to the cardiac patient.   Exercise & Equipment Safety: - Individual verbal instruction and demonstration of equipment use and safety with use of the equipment. Flowsheet Row Cardiac Rehab from 02/06/2016 in [facility] Cardiac and Pulmonary Rehab  Date  02/06/16  Educator  SB  Instruction Review Code  2- meets goals/outcomes      Infection Prevention: - Provides verbal and written material to individual with discussion of infection control including proper hand washing and proper equipment cleaning during exercise session. Flowsheet Row Cardiac Rehab from 02/06/2016 in [facility] Cardiac and Pulmonary Rehab  Date  02/06/16  Educator  SB  Instruction Review Code  2- meets goals/outcomes      Falls Prevention: - Provides verbal and written material to individual with discussion of falls prevention and safety. Flowsheet Row  Cardiac Rehab from 02/06/2016 in [facility] Cardiac and Pulmonary Rehab  Date  02/06/16  Educator  SB  Instruction Review Code  2- meets goals/outcomes  Diabetes: - Individual verbal and written instruction to review signs/symptoms of diabetes, desired ranges of glucose level fasting, after meals and with exercise. Advice that pre and post exercise glucose checks will be done for 3 sessions at entry of program. Flowsheet Row Cardiac Rehab from 02/06/2016 in [facility] Cardiac and Pulmonary Rehab  Date  02/06/16  Educator  SB  Instruction Review Code  2- meets goals/outcomes       Knowledge Questionnaire Score:     Knowledge Questionnaire Score - 02/06/16 1455      Knowledge Questionnaire Score   Pre Score 20/28      Core Components/Risk Factors/Patient Goals at Admission:     Personal Goals and Risk Factors at Admission - 02/06/16 1448      Core Components/Risk Factors/Patient Goals on Admission    Weight Management Yes;Weight Maintenance   Intervention Weight Management: Develop a combined nutrition and exercise program designed to reach desired caloric intake, while maintaining appropriate intake of nutrient and fiber, sodium and fats, and appropriate energy expenditure required for the weight goal.;Weight Management: Provide education and appropriate resources to help participant work on and attain dietary goals.   Admit Weight 159 lb 1.6 oz (72.2 kg)   Expected Outcomes Short Term: Continue to assess and modify interventions until short term weight is achieved;Long Term: Adherence to nutrition and physical activity/exercise program aimed toward attainment of established weight goal;Weight Maintenance: Understanding of the daily nutrition guidelines, which includes 25-35% calories from fat, 7% or less cal from saturated fats, less than 200mg  cholesterol, less than 1.5gm of sodium, & 5 or more servings of fruits and vegetables daily   Sedentary Yes   Intervention Provide advice,  education, support and counseling about physical activity/exercise needs.;Develop an individualized exercise prescription for aerobic and resistive training based on initial evaluation findings, risk stratification, comorbidities and participant's personal goals.   Expected Outcomes Achievement of increased cardiorespiratory fitness and enhanced flexibility, muscular endurance and strength shown through measurements of functional capacity and personal statement of participant.   Increase Strength and Stamina Yes   Intervention Provide advice, education, support and counseling about physical activity/exercise needs.;Develop an individualized exercise prescription for aerobic and resistive training based on initial evaluation findings, risk stratification, comorbidities and participant's personal goals.   Expected Outcomes Achievement of increased cardiorespiratory fitness and enhanced flexibility, muscular endurance and strength shown through measurements of functional capacity and personal statement of participant.   Tobacco Cessation Yes   Intervention Assist the participant in steps to quit. Provide individualized education and counseling about committing to Tobacco Cessation, relapse prevention, and pharmacological support that can be provided by physician.;Education officer, environmental, assist with locating and accessing local/national Quit Smoking programs, and support quit date choice.   Expected Outcomes Short Term: Will demonstrate readiness to quit, by selecting a quit date.;Short Term: Will quit all tobacco product use, adhering to prevention of relapse plan.;Long Term: Complete abstinence from all tobacco products for at least 12 months from quit date.  Quit date set to be 02/12/2016. Has patches and nicotine gum available to help with cessation of tobacco.   Diabetes Yes   Intervention Provide education about proper nutrition, including hydration, and aerobic/resistive exercise prescription  along with prescribed medications to achieve blood glucose in normal ranges: Fasting glucose 65-99 mg/dL;Provide education about signs/symptoms and action to take for hypo/hyperglycemia.   Expected Outcomes Short Term: Participant verbalizes understanding of the signs/symptoms and immediate care of hyper/hypoglycemia, proper foot care and importance of medication, aerobic/resistive exercise and nutrition plan  for blood glucose control.;Long Term: Attainment of HbA1C < 7%.   Hypertension Yes   Intervention Provide education on lifestyle modifcations including regular physical activity/exercise, weight management, moderate sodium restriction and increased consumption of fresh fruit, vegetables, and low fat dairy, alcohol moderation, and smoking cessation.;Monitor prescription use compliance.   Expected Outcomes Short Term: Continued assessment and intervention until BP is < 140/10390mm HG in hypertensive participants. < 130/6480mm HG in hypertensive participants with diabetes, heart failure or chronic kidney disease.;Long Term: Maintenance of blood pressure at goal levels.   Lipids Yes   Intervention Provide education and support for participant on nutrition & aerobic/resistive exercise along with prescribed medications to achieve LDL 70mg , HDL >40mg .   Expected Outcomes Short Term: Participant states understanding of desired cholesterol values and is compliant with medications prescribed. Participant is following exercise prescription and nutrition guidelines.;Long Term: Cholesterol controlled with medications as prescribed, with individualized exercise RX and with personalized nutrition plan. Value goals: LDL < 70mg , HDL > 40 mg.      Core Components/Risk Factors/Patient Goals Review:    Core Components/Risk Factors/Patient Goals at Discharge (Final Review):    ITP Comments:     ITP Comments    Row Name 02/06/16 1443           ITP Comments Medical review done with Initial ITP completed.   [facility]          Comments: Initial ITP

## 2016-02-06 NOTE — Addendum Note (Signed)
Addended by: Kendrick FriesLOPEZ, MARINA C on: 02/06/2016 09:33 AM   Modules accepted: Orders

## 2016-02-07 ENCOUNTER — Other Ambulatory Visit: Payer: Self-pay

## 2016-02-07 MED ORDER — ASPIRIN EC 81 MG PO TBEC: 81.0000 mg | DELAYED_RELEASE_TABLET | Freq: Every day | ORAL | 2 refills | Status: DC

## 2016-02-07 MED ORDER — FUROSEMIDE 20 MG PO TABS: 20.0000 mg | ORAL_TABLET | ORAL | 2 refills | Status: DC

## 2016-02-14 ENCOUNTER — Observation Stay
Admission: EM | Admit: 2016-02-14 | Discharge: 2016-02-15 | Disposition: A | Discharge status: Home / Self Care | Payer: Commercial Managed Care - PPO | Attending: Internal Medicine | Admitting: Internal Medicine

## 2016-02-14 ENCOUNTER — Emergency Department: Payer: Commercial Managed Care - PPO

## 2016-02-14 ENCOUNTER — Other Ambulatory Visit: Payer: Self-pay

## 2016-02-14 ENCOUNTER — Telehealth: Payer: Self-pay | Admitting: Physician Assistant

## 2016-02-14 DIAGNOSIS — I214 Non-ST elevation (NSTEMI) myocardial infarction: Secondary | ICD-10-CM

## 2016-02-14 DIAGNOSIS — Z794 Long term (current) use of insulin: Secondary | ICD-10-CM | POA: Insufficient documentation

## 2016-02-14 DIAGNOSIS — E1122 Type 2 diabetes mellitus with diabetic chronic kidney disease: Secondary | ICD-10-CM | POA: Diagnosis not present

## 2016-02-14 DIAGNOSIS — E785 Hyperlipidemia, unspecified: Secondary | ICD-10-CM | POA: Diagnosis not present

## 2016-02-14 DIAGNOSIS — N182 Chronic kidney disease, stage 2 (mild): Secondary | ICD-10-CM

## 2016-02-14 DIAGNOSIS — Z955 Presence of coronary angioplasty implant and graft: Secondary | ICD-10-CM | POA: Insufficient documentation

## 2016-02-14 DIAGNOSIS — I129 Hypertensive chronic kidney disease with stage 1 through stage 4 chronic kidney disease, or unspecified chronic kidney disease: Secondary | ICD-10-CM | POA: Diagnosis not present

## 2016-02-14 DIAGNOSIS — I252 Old myocardial infarction: Secondary | ICD-10-CM | POA: Insufficient documentation

## 2016-02-14 DIAGNOSIS — R0602 Shortness of breath: Secondary | ICD-10-CM | POA: Diagnosis not present

## 2016-02-14 DIAGNOSIS — Z79899 Other long term (current) drug therapy: Secondary | ICD-10-CM | POA: Insufficient documentation

## 2016-02-14 DIAGNOSIS — I255 Ischemic cardiomyopathy: Secondary | ICD-10-CM | POA: Insufficient documentation

## 2016-02-14 DIAGNOSIS — I2510 Atherosclerotic heart disease of native coronary artery without angina pectoris: Secondary | ICD-10-CM | POA: Insufficient documentation

## 2016-02-14 DIAGNOSIS — Z7982 Long term (current) use of aspirin: Secondary | ICD-10-CM | POA: Insufficient documentation

## 2016-02-14 DIAGNOSIS — E876 Hypokalemia: Secondary | ICD-10-CM | POA: Insufficient documentation

## 2016-02-14 DIAGNOSIS — F17210 Nicotine dependence, cigarettes, uncomplicated: Secondary | ICD-10-CM | POA: Diagnosis not present

## 2016-02-14 DIAGNOSIS — N183 Chronic kidney disease, stage 3 (moderate): Secondary | ICD-10-CM | POA: Diagnosis not present

## 2016-02-14 DIAGNOSIS — Z7902 Long term (current) use of antithrombotics/antiplatelets: Secondary | ICD-10-CM | POA: Insufficient documentation

## 2016-02-14 DIAGNOSIS — I10 Essential (primary) hypertension: Secondary | ICD-10-CM | POA: Diagnosis present

## 2016-02-14 DIAGNOSIS — E1151 Type 2 diabetes mellitus with diabetic peripheral angiopathy without gangrene: Secondary | ICD-10-CM | POA: Insufficient documentation

## 2016-02-14 LAB — BASIC METABOLIC PANEL
ANION GAP: 8 (ref 5–15)
BUN: 27 mg/dL — ABNORMAL HIGH (ref 6–20)
CHLORIDE: 104 mmol/L (ref 101–111)
CO2: 27 mmol/L (ref 22–32)
Calcium: 9.5 mg/dL (ref 8.9–10.3)
Creatinine, Ser: 1.82 mg/dL — ABNORMAL HIGH (ref 0.61–1.24)
GFR calc non Af Amer: 38 mL/min — ABNORMAL LOW (ref >60)
GFR, EST AFRICAN AMERICAN: 44 mL/min — AB (ref >60)
Glucose, Bld: 316 mg/dL — ABNORMAL HIGH (ref 65–99)
POTASSIUM: 3.7 mmol/L (ref 3.5–5.1)
SODIUM: 139 mmol/L (ref 135–145)

## 2016-02-14 LAB — CBC
HCT: 43.1 % (ref 40.0–52.0)
Hemoglobin: 14.6 g/dL (ref 13.0–18.0)
MCH: 31.8 pg (ref 26.0–34.0)
MCHC: 33.9 g/dL (ref 32.0–36.0)
MCV: 93.9 fL (ref 80.0–100.0)
Platelets: 334 10*3/uL (ref 150–440)
RBC: 4.59 MIL/uL (ref 4.40–5.90)
RDW: 13 % (ref 11.5–14.5)
WBC: 8.4 10*3/uL (ref 3.8–10.6)

## 2016-02-14 LAB — TROPONIN I
TROPONIN I: 0.05 ng/mL — AB (ref <0.03)
Troponin I: 0.05 ng/mL (ref <0.03)

## 2016-02-14 LAB — GLUCOSE, CAPILLARY: GLUCOSE-CAPILLARY: 254 mg/dL — AB (ref 65–99)

## 2016-02-14 LAB — BRAIN NATRIURETIC PEPTIDE: B NATRIURETIC PEPTIDE 5: 1241 pg/mL — AB (ref 0.0–100.0)

## 2016-02-14 MED ORDER — NICOTINE 14 MG/24HR TD PT24
14.0000 mg | MEDICATED_PATCH | Freq: Every day | TRANSDERMAL | Status: DC
  Administered 2016-02-14: 14 mg via TRANSDERMAL

## 2016-02-14 MED ORDER — INSULIN ASPART 100 UNIT/ML ~~LOC~~ SOLN
0.0000 [IU] | Freq: Three times a day (TID) | SUBCUTANEOUS | Status: DC
  Administered 2016-02-15 (×2): 3 [IU] via SUBCUTANEOUS
  Filled 2016-02-14 (×2): qty 3

## 2016-02-14 MED ORDER — FUROSEMIDE 20 MG PO TABS
20.0000 mg | ORAL_TABLET | Freq: Every day | ORAL | Status: DC
  Administered 2016-02-15: 20 mg via ORAL
  Filled 2016-02-14: qty 1

## 2016-02-14 MED ORDER — CARVEDILOL 12.5 MG PO TABS
25.0000 mg | ORAL_TABLET | Freq: Two times a day (BID) | ORAL | Status: DC
  Administered 2016-02-15: 25 mg via ORAL
  Filled 2016-02-14: qty 2

## 2016-02-14 MED ORDER — ASPIRIN EC 81 MG PO TBEC
81.0000 mg | DELAYED_RELEASE_TABLET | Freq: Every day | ORAL | Status: DC
  Administered 2016-02-15: 81 mg via ORAL
  Filled 2016-02-14: qty 1

## 2016-02-14 MED ORDER — DIPHENHYDRAMINE HCL 25 MG PO CAPS
25.0000 mg | ORAL_CAPSULE | Freq: Every evening | ORAL | Status: DC | PRN
  Administered 2016-02-14: 25 mg via ORAL
  Filled 2016-02-14: qty 1

## 2016-02-14 MED ORDER — INSULIN ASPART 100 UNIT/ML ~~LOC~~ SOLN
0.0000 [IU] | Freq: Every day | SUBCUTANEOUS | Status: DC
Start: 2016-02-14 — End: 2016-02-15
  Administered 2016-02-14: 3 [IU] via SUBCUTANEOUS
  Filled 2016-02-14: qty 3

## 2016-02-14 MED ORDER — ASPIRIN 81 MG PO CHEW
324.0000 mg | CHEWABLE_TABLET | Freq: Once | ORAL | Status: AC
  Administered 2016-02-14: 324 mg via ORAL
  Filled 2016-02-14: qty 4

## 2016-02-14 MED ORDER — BISACODYL 5 MG PO TBEC: 10.0000 mg | DELAYED_RELEASE_TABLET | Freq: Once | ORAL | Status: DC

## 2016-02-14 MED ORDER — SODIUM CHLORIDE 0.9% FLUSH
3.0000 mL | Freq: Two times a day (BID) | INTRAVENOUS | Status: DC
  Administered 2016-02-14 – 2016-02-15 (×2): 3 mL via INTRAVENOUS

## 2016-02-14 MED ORDER — ACETAMINOPHEN 325 MG PO TABS: 650.0000 mg | ORAL_TABLET | Freq: Four times a day (QID) | ORAL | Status: DC | PRN

## 2016-02-14 MED ORDER — ENOXAPARIN SODIUM 40 MG/0.4ML ~~LOC~~ SOLN
40.0000 mg | SUBCUTANEOUS | Status: DC
  Administered 2016-02-14: 40 mg via SUBCUTANEOUS
  Filled 2016-02-14: qty 0.4

## 2016-02-14 MED ORDER — ACETAMINOPHEN 650 MG RE SUPP: 650.0000 mg | Freq: Four times a day (QID) | RECTAL | Status: DC | PRN

## 2016-02-14 MED ORDER — INSULIN GLARGINE 100 UNIT/ML ~~LOC~~ SOLN
15.0000 [IU] | Freq: Every day | SUBCUTANEOUS | Status: DC
  Filled 2016-02-14 (×2): qty 0.15

## 2016-02-14 MED ORDER — TICAGRELOR 90 MG PO TABS
90.0000 mg | ORAL_TABLET | Freq: Two times a day (BID) | ORAL | Status: DC
  Administered 2016-02-14 – 2016-02-15 (×2): 90 mg via ORAL
  Filled 2016-02-14 (×2): qty 1

## 2016-02-14 MED ORDER — GABAPENTIN 300 MG PO CAPS
300.0000 mg | ORAL_CAPSULE | Freq: Two times a day (BID) | ORAL | Status: DC
  Administered 2016-02-14 – 2016-02-15 (×2): 300 mg via ORAL
  Filled 2016-02-14 (×2): qty 1

## 2016-02-14 MED ORDER — SPIRONOLACTONE 25 MG PO TABS
25.0000 mg | ORAL_TABLET | Freq: Every day | ORAL | Status: DC
Start: 2016-02-15 — End: 2016-02-15
  Administered 2016-02-15: 25 mg via ORAL
  Filled 2016-02-14: qty 1

## 2016-02-14 MED ORDER — NICOTINE 14 MG/24HR TD PT24
14.0000 mg | MEDICATED_PATCH | Freq: Every day | TRANSDERMAL | Status: DC
  Administered 2016-02-15: 14 mg via TRANSDERMAL
  Filled 2016-02-14 (×2): qty 1

## 2016-02-14 MED ORDER — LISINOPRIL 20 MG PO TABS
20.0000 mg | ORAL_TABLET | Freq: Every day | ORAL | Status: DC
  Administered 2016-02-15: 20 mg via ORAL
  Filled 2016-02-14: qty 1

## 2016-02-14 MED ORDER — ATORVASTATIN CALCIUM 20 MG PO TABS
80.0000 mg | ORAL_TABLET | Freq: Every day | ORAL | Status: DC
  Administered 2016-02-14: 80 mg via ORAL
  Filled 2016-02-14: qty 4

## 2016-02-14 NOTE — Telephone Encounter (Signed)
Patient with recent subacute MI with PCI/DES to mid LAD with resulting EF of 25-30% s/p LifeVest. His main presenting symptom at the time of his MI was SOB to the point he was trying to get cool air from the refrigerator. Cannot rule out ischemic event or any degree of volume overload given he appetite high in salt with the above cardiomyopathy at this time. Recommend he proceed to [facility] ED for further evaluation.

## 2016-02-14 NOTE — Telephone Encounter (Signed)
°  Pt c/o Shortness Of Breath: STAT if SOB developed within the last 24 hours or pt is noticeably SOB on the phone  1. Are you currently SOB (can you hear that pt is SOB on the phone)? Just a little  2. How long have you been experiencing SOB? Few hours  3. Are you SOB when sitting or when up moving around?he states he is sitting down right now and he is short of breath   4. Are you currently experiencing any other symptoms?  Just a weak Denies CP  He is asking if this is something he needs to go ED for  Please advise

## 2016-02-14 NOTE — Telephone Encounter (Addendum)
Spoke w/ pt. Advised him of Dr. Windell HummingbirdGollan's recommendation. He is agreeable and will have his wife drive him there when she returns from the store.  He asks if he should call 911, advised him that if his wife does not return soon, so do so.  He is hesitant to call due to cost, states that his wife will return shortly. He is appreciative of the call.

## 2016-02-14 NOTE — ED Provider Notes (Signed)
[facility] Emergency Department Provider Note  ____________________________________________  Time seen: Approximately 5:33 PM  I have reviewed the triage vital signs and the nursing notes.   HISTORY  Chief Complaint Shortness of Breath    HPI Damon [name] is a 65 y.o. male with a history of CAD status post STEMI on Plavix and aspirin, ischemic cardiomyopathy, peripheral artery disease, DM, HTN, CK EDpresenting for shortness of breath. The patient reports that he was lying in bed when he became short of breath without any other associated symptoms. It lasted for several hours and at this time, he feels "much better." During his previous MI, the patient's only symptom was shortness of breath.  The patient denies palpitations, lightheadedness or syncope, diaphoresis, nausea or vomiting.  He is not sure whether he took aspirin today.   Past Medical History:  Diagnosis Date  . CKD (chronic kidney disease), stage III   . Diabetes mellitus without complication (HCC)   . Diverticulitis   . Essential hypertension   . MI (myocardial infarction)   . PAD (peripheral artery disease) (HCC)    a. 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  . Tobacco abuse     Patient Active Problem List   Diagnosis Date Noted  . CAD (coronary artery disease) 01/17/2016  . Essential hypertension 01/17/2016  . HLD (hyperlipidemia) 01/17/2016  . ST elevation myocardial infarction involving left anterior descending (LAD) coronary artery (HCC)   . Cardiomyopathy, ischemic   . Acute respiratory failure (HCC) 01/13/2016  . NSTEMI (non-ST elevated myocardial infarction) [facility])     Past Surgical History:  Procedure Laterality Date  . CARDIAC CATHETERIZATION N/A 01/13/2016   Procedure: LEFT HEART CATH AND CORONARY ANGIOGRAPHY;  Surgeon: Iran Ouch, MD;  Location: [facility] INVASIVE CV LAB;  Service: Cardiovascular;  Laterality: N/A;  . CARDIAC  CATHETERIZATION N/A 01/13/2016   Procedure: Coronary Stent Intervention;  Surgeon: Iran Ouch, MD;  Location: [facility] INVASIVE CV LAB;  Service: Cardiovascular;  Laterality: N/A;  . CARDIAC CATHETERIZATION N/A 01/16/2016   Procedure: Left Heart Cath and Coronary Angiography;  Surgeon: Iran Ouch, MD;  Location: [facility] INVASIVE CV LAB;  Service: Cardiovascular;  Laterality: N/A;  . NECK SURGERY    . PERIPHERAL VASCULAR CATHETERIZATION Left 11/01/2015   Procedure: Lower Extremity Angiography;  Surgeon: Renford Dills, MD;  Location: [facility] INVASIVE CV LAB;  Service: Cardiovascular;  Laterality: Left;    Current Outpatient Rx  . Order #: [id number] Class: Print  . Order #: [id number] Class: Normal  . Order #: [id number] Class: Normal  . Order #: [id number] Class: Normal  . Order #: [id number] Class: Historical Med  . Order #: [id number] Class: Historical Med  . Order #: [id number] Class: Normal  . Order #: [id number] Class: Historical Med  . Order #: [id number] Class: Normal  . Order #: [id number] Class: Normal  . Order #: [id number] Class: Historical Med  . Order #: [id number] Class: Historical Med    Allergies Patient has no known allergies.  No family history on file.  Social History Social History  Substance Use Topics  . Smoking status: Current Every Day Smoker  . Smokeless tobacco: Current User  . Alcohol use No    Review of Systems Constitutional: No fever/chills.No lightheadedness or syncope. Eyes: No visual changes. ENT: No sore throat. No congestion or rhinorrhea. Cardiovascular: Denies chest pain. Denies palpitations. Respiratory: Positive shortness of breath.  No cough. Gastrointestinal: No abdominal pain.  No nausea, no vomiting.  No diarrhea.  No constipation.  Genitourinary: Negative for dysuria. Musculoskeletal: Negative for back pain. No large semi-swelling or calf pain. Skin: Negative for rash. Neurological: Negative for headaches. No focal numbness, tingling or weakness.    10-point ROS otherwise negative.  ____________________________________________   PHYSICAL EXAM:  VITAL SIGNS: ED Triage Vitals  Enc Vitals Group     BP 02/14/16 1639 133/61     Pulse Rate 02/14/16 1639 79     Resp 02/14/16 1639 17     Temp 02/14/16 1639 97.5 F (36.4 C)     Temp Source 02/14/16 1639 Oral     SpO2 02/14/16 1639 100 %     Weight 02/14/16 1639 159 lb (72.1 kg)     Height 02/14/16 1639 5\' 10"  (1.778 m)     Head Circumference --      Peak Flow --      Pain Score 02/14/16 1702 0     Pain Loc --      Pain Edu? --      Excl. in GC? --     Constitutional: Alert and oriented. Chronically ill appearing but in no acute distress. Answers questions appropriately. Eyes: Conjunctivae are normal.  EOMI. No scleral icterus. Head: Atraumatic. Nose: No congestion/rhinnorhea. Mouth/Throat: Mucous membranes are moist.  Neck: No stridor.  Supple.   Cardiovascular: Normal rate, regular rhythm. No murmurs, rubs or gallops.  Respiratory: Normal respiratory effort.  No accessory muscle use or retractions. Lungs CTAB.  No wheezes, rales or ronchi. Gastrointestinal: Soft, nontender and nondistended.  No guarding or rebound.  No peritoneal signs. Musculoskeletal: No LE edema. No ttp in the calves or palpable cords.  Negative Homan's sign. Neurologic:  A&Ox3.  Speech is clear.  Face and smile are symmetric.  EOMI.  Moves all extremities well. Skin:  Skin is warm, dry and intact. No rash noted. Psychiatric: Mood and affect are normal. Speech and behavior are normal.  Normal judgement.  ____________________________________________   LABS (all labs ordered are listed, but only abnormal results are displayed)  Labs Reviewed  BASIC METABOLIC PANEL - Abnormal; Notable for the following:       Result Value   Glucose, Bld 316 (*)    BUN 27 (*)    Creatinine, Ser 1.82 (*)    GFR calc non Af Amer 38 (*)    GFR calc Af Amer 44 (*)    All other components within normal limits   TROPONIN I - Abnormal; Notable for the following:    Troponin I 0.05 (*)    All other components within normal limits  CBC  TROPONIN I  BRAIN NATRIURETIC PEPTIDE   ____________________________________________  EKG  ED ECG REPORT I, Rockne MenghiniNorman, Anne-Caroline, the attending physician, personally viewed and interpreted this ECG.   Date: 02/14/2016  EKG Time: 1638  Rate: 79  Rhythm: normal sinus rhythm  Axis: leftward  Intervals:none  ST&T Change: 1 mm ST elevation in V3 and V4.  This EKG is compared to 10/31 and the ST elevation is improved comparatively.  ____________________________________________  RADIOLOGY  Dg Chest 2 View  Result Date: 02/14/2016 CLINICAL DATA:  65 year old male with shortness breath. Myocardial infarction 4 weeks ago. Hypertension. Diabetes. Smoker. Subsequent encounter. EXAM: CHEST  2 VIEW COMPARISON:  01/15/2016, 03/24/2015 and 04/14/2008. FINDINGS: Hyperinflation of the lungs with central pulmonary vascular prominence stable. No infiltrate, congestive heart failure or pneumothorax. No plain film evidence of pulmonary malignancy. Heart size within normal limits. Calcified aorta. Postsurgical changes cervical spine. Degenerative changes thoracic spine and acromioclavicular joint. IMPRESSION:  Hyperinflated lungs without acute pulmonary abnormality. Aortic atherosclerosis. Electronically Signed   By: Lacy DuverneySteven  Olson M.D.   On: 02/14/2016 17:25    ____________________________________________   PROCEDURES  Procedure(s) performed: None  Procedures  Critical Care performed: No ____________________________________________   INITIAL IMPRESSION / ASSESSMENT AND PLAN / ED COURSE  Pertinent labs & imaging results that were available during my care of the patient were reviewed by me and considered in my medical decision making (see chart for details).  65 y.o. male with a history of a recent STEMI presenting with shortness of breath, which was his only symptom  during his prior STEMI. I've just been called by the lab that he is a positive troponin. At this time, the patient is hemodynamically stable and symptom-free, but he will require admission to the hospital for further evaluation and treatment. He'll be given an aspirin in the emergency department, as he is not sure whether he took his aspirin today. There is no indication for heparinization at this time.  ____________________________________________  FINAL CLINICAL IMPRESSION(S) / ED DIAGNOSES  Final diagnoses:  Shortness of breath  NSTEMI (non-ST elevated myocardial infarction) [facility](HCC)    Clinical Course       NEW MEDICATIONS STARTED DURING THIS VISIT:  New Prescriptions   No medications on file      Rockne MenghiniAnne-Caroline Mattea Seger, MD 02/14/16 1742

## 2016-02-14 NOTE — ED Triage Notes (Signed)
Pt c/o becoming hot and breaking out into a sweat and having SOB today, at present pt states he feels fine, denies SOB, skin is warm and dry..Marland Kitchen

## 2016-02-14 NOTE — H&P (Addendum)
[facility] [place] [facility]   PATIENT NAME: Damon [name]    MR#:  [id number]  DATE OF BIRTH:  1950/09/01  DATE OF ADMISSION:  02/14/2016  PRIMARY CARE PHYSICIAN: Emogene MorganAYCOCK, [name], MD   REQUESTING/REFERRING PHYSICIAN: Dr Virgilio Freesaroline Norman  CHIEF COMPLAINT:   Chief Complaint  Patient presents with  . Shortness of Breath    HISTORY OF PRESENT ILLNESS:  Damon [name]  is a 65 y.o. male with a known history of CAD with recent MI and stents. He presents to the ER with shortness of breath that happened today around 3 PM and lasted for about 2 hours. He thinks it was too warm in his house. No complaints of chest pain. He does have some constipation. In the ER, troponin was borderline at 0.05 and hospitalist services contacted for further evaluation. The patient also states that his LifeVest is malfunctioning. He does not have his LifeVest with him at this point. Family to bring in tomorrow.  PAST MEDICAL HISTORY:   Past Medical History:  Diagnosis Date  . CKD (chronic kidney disease), stage III   . Diabetes mellitus without complication (HCC)   . Diverticulitis   . Essential hypertension   . MI (myocardial infarction)   . PAD (peripheral artery disease) (HCC)    a. 10/2015 s/p PTA and stenting of the RCIA, LCIA, and PTA of the LE[employer] Environmental manager(Schnier).  . Tobacco abuse     PAST SURGICAL HISTORY:   Past Surgical History:  Procedure Laterality Date  . CARDIAC CATHETERIZATION N/A 01/13/2016   Procedure: LEFT HEART CATH AND CORONARY ANGIOGRAPHY;  Surgeon: Iran OuchMuhammad A Arida, MD;  Location: [facility] INVASIVE CV LAB;  Service: Cardiovascular;  Laterality: N/A;  . CARDIAC CATHETERIZATION N/A 01/13/2016   Procedure: Coronary Stent Intervention;  Surgeon: Iran OuchMuhammad A Arida, MD;  Location: [facility] INVASIVE CV LAB;  Service: Cardiovascular;  Laterality: N/A;  . CARDIAC CATHETERIZATION N/A 01/16/2016   Procedure: Left Heart Cath and Coronary Angiography;  Surgeon: Iran OuchMuhammad A Arida, MD;   Location: [facility] INVASIVE CV LAB;  Service: Cardiovascular;  Laterality: N/A;  . NECK SURGERY    . PERIPHERAL VASCULAR CATHETERIZATION Left 11/01/2015   Procedure: Lower Extremity Angiography;  Surgeon: Renford DillsGregory G Schnier, MD;  Location: [facility] INVASIVE CV LAB;  Service: Cardiovascular;  Laterality: Left;    SOCIAL HISTORY:   Social History  Substance Use Topics  . Smoking status: Current Every Day Smoker  . Smokeless tobacco: Current User  . Alcohol use No    FAMILY HISTORY:   Family History  Problem Relation Age of Onset  . Intracerebral hemorrhage Mother   . Diabetes Mother   . Cancer Mother   . Diabetes Father     DRUG ALLERGIES:  No Known Allergies  REVIEW OF SYSTEMS:  CONSTITUTIONAL: No fever. Positive for chills. Positive for fatigue.  EYES: No blurred or double vision.  EARS, NOSE, AND THROAT: No tinnitus or ear pain. Positive for runny nose and sore throat. RESPIRATORY: No cough. Positive for shortness of breath. No wheezing or hemoptysis.  CARDIOVASCULAR: No chest pain, orthopnea, edema.  GASTROINTESTINAL: No nausea, vomiting, diarrhea. Positive for lower abdominal pain and constipation. No blood in bowel movements GENITOURINARY: No dysuria, hematuria.  ENDOCRINE: No polyuria, nocturia,  HEMATOLOGY: No anemia, easy bruising or bleeding SKIN: No rash or lesion. MUSCULOSKELETAL: Positive for knee pain and left hand pain NEUROLOGIC: No tingling, numbness, weakness.  PSYCHIATRY: No anxiety or depression.   MEDICATIONS AT HOME:   Prior to Admission medications  Medication Sig Start Date End Date Taking? Authorizing Provider  aspirin EC 81 MG tablet Take 1 tablet (81 mg total) by mouth daily. 02/07/16  Yes Ryan M Dunn, PA-C  atorvastatin (LIPITOR) 80 MG tablet Take 1 tablet (80 mg total) by mouth daily at 6 PM. Patient taking differently: Take 80 mg by mouth at bedtime.  01/17/16  Yes Enid Baas, MD  carvedilol (COREG) 25 MG tablet Take 1 tablet (25 mg total)  by mouth 2 (two) times daily with a meal. 02/01/16  Yes Ryan M Dunn, PA-C  furosemide (LASIX) 20 MG tablet Take 1 tablet (20 mg total) by mouth every other day. 02/07/16  Yes Ryan M Dunn, PA-C  gabapentin (NEURONTIN) 300 MG capsule Take 300 mg by mouth 2 (two) times daily.   Yes Historical Provider, MD  insulin glargine (LANTUS) 100 UNIT/ML injection Inject 30 Units into the skin daily.   Yes Historical Provider, MD  lisinopril (PRINIVIL,ZESTRIL) 20 MG tablet Take 1 tablet (20 mg total) by mouth daily. 01/18/16  Yes Enid Baas, MD  spironolactone (ALDACTONE) 25 MG tablet Take 1 tablet (25 mg total) by mouth daily. 01/17/16  Yes Enid Baas, MD  ticagrelor (BRILINTA) 90 MG TABS tablet Take 1 tablet (90 mg total) by mouth 2 (two) times daily. 01/17/16  Yes Enid Baas, MD      VITAL SIGNS:  Blood pressure 127/62, pulse 76, temperature 97.5 F (36.4 C), temperature source Oral, resp. rate 16, height 5\' 10"  (1.778 m), weight 72.1 kg (159 lb), SpO2 95 %.  PHYSICAL EXAMINATION:  GENERAL:  65 y.o.-year-old patient lying in the bed with no acute distress.  EYES: Pupils equal, round, reactive to light and accommodation. No scleral icterus. Extraocular muscles intact.  HEENT: Head atraumatic, normocephalic. Oropharynx and nasopharynx clear.  NECK:  Supple, no jugular venous distention. No thyroid enlargement, no tenderness.  LUNGS: Normal breath sounds bilaterally, no wheezing, rales,rhonchi or crepitation. No use of accessory muscles of respiration.  CARDIOVASCULAR: S1, S2 normal. No murmurs, rubs, or gallops.  ABDOMEN: Soft, nontender, nondistended. Bowel sounds present. No organomegaly or mass.  EXTREMITIES: No pedal edema, cyanosis, or clubbing.  NEUROLOGIC: Cranial nerves II through XII are intact. Muscle strength 5/5 in all extremities. Sensation intact. Gait not checked.  PSYCHIATRIC: The patient is alert and oriented x 3.  SKIN: No rash, lesion, or ulcer.   LABORATORY  PANEL:   CBC  Recent Labs Lab 02/14/16 1651  WBC 8.4  HGB 14.6  HCT 43.1  PLT 334   ------------------------------------------------------------------------------------------------------------------  Chemistries   Recent Labs Lab 02/14/16 1651  NA 139  K 3.7  CL 104  CO2 27  GLUCOSE 316*  BUN 27*  CREATININE 1.82*  CALCIUM 9.5   ------------------------------------------------------------------------------------------------------------------  Cardiac Enzymes  Recent Labs Lab 02/14/16 1651  TROPONINI 0.05*   ------------------------------------------------------------------------------------------------------------------  RADIOLOGY:  Dg Chest 2 View  Result Date: 02/14/2016 CLINICAL DATA:  65 year old male with shortness breath. Myocardial infarction 4 weeks ago. Hypertension. Diabetes. Smoker. Subsequent encounter. EXAM: CHEST  2 VIEW COMPARISON:  01/15/2016, 03/24/2015 and 04/14/2008. FINDINGS: Hyperinflation of the lungs with central pulmonary vascular prominence stable. No infiltrate, congestive heart failure or pneumothorax. No plain film evidence of pulmonary malignancy. Heart size within normal limits. Calcified aorta. Postsurgical changes cervical spine. Degenerative changes thoracic spine and acromioclavicular joint. IMPRESSION: Hyperinflated lungs without acute pulmonary abnormality. Aortic atherosclerosis. Electronically Signed   By: Lacy Duverney M.D.   On: 02/14/2016 17:25    EKG:   Normal sinus rhythm 79  bpm, left atrial enlargement, left anterior fascicular block, left ventricular hypertrophy. Q waves septally  IMPRESSION AND PLAN:   1. Shortness of breath with borderline troponin and recent myocardial infarction and stent. Observe overnight check 2 more troponins and get cardiology consultation in the a.m. Continue aspirin, Brilinta, Coreg, and atorvastatin. Patient not having any symptoms at this point. Less likely a myocardial infarction. 2.  Essential hypertension continue usual medications 3. Chronic kidney disease stage III continue to monitor with diuresis 4. Cardiomyopathy. Patient states that his LifeVest is not working. We'll get care manager consultation and this will have to be monitored either here or as outpatient. 5. Hyperlipidemia unspecified continue atorvastatin 6. Type 2 diabetes mellitus. Half dose of Lantus in the morning because the patient is given a be nothing by mouth. Last hemoglobin A1c 8.5. Sliding scale insulin ordered while here. 7. Tobacco abuse. Smoking cessation counseling done 3 minutes by me. Nicotine patch ordered.   All the records are reviewed and case discussed with ED provider. Management plans discussed with the patient, family and they are in agreement.  CODE STATUS: Full code  TOTAL TIME TAKING CARE OF THIS PATIENT: 50 minutes.    Alford HighlandWIETING, Keilee Denman M.D on 02/14/2016 at 6:16 PM  Between 7am to 6pm - Pager - [phone]  After 6pm call admission pager [phone]  [facility] Office  [phone]  CC: Primary care physician; Emogene MorganAYCOCK, [name], MD

## 2016-02-14 NOTE — Telephone Encounter (Signed)
Spoke w/ pt.  He reports that is currently very SOB. He c/o back pain and feels that he has to urinate, but cannot. Denies chest pain.  Denies ankle edema.  SOB started/worsened a few hours ago. Pt does not weigh daily. He had a bacon sandwich for breakfast, limits his fluids. A woman can be heard in the background stating that he eats hardly anything.  Pt states that he is taking his lasix as prescribed, but does not urinate very much.  Woman in the background repeatedly tells him that he just needs a laxative. Pt would like to know if he should go to the ED.  Advised pt that if he feels his sx are emergent, to proceed to the ED, but I will make Eula Listenyan Dunn, PA aware of his concerns and see if we can avoid the ED. Pt is appreciative and will await a call back.

## 2016-02-15 ENCOUNTER — Observation Stay (HOSPITAL_BASED_OUTPATIENT_CLINIC_OR_DEPARTMENT_OTHER)
Admit: 2016-02-15 | Discharge: 2016-02-15 | Disposition: A | Discharge status: Home / Self Care | Payer: Commercial Managed Care - PPO | Attending: Physician Assistant | Admitting: Physician Assistant

## 2016-02-15 ENCOUNTER — Encounter: Payer: Self-pay | Admitting: *Deleted

## 2016-02-15 DIAGNOSIS — R0602 Shortness of breath: Secondary | ICD-10-CM

## 2016-02-15 DIAGNOSIS — I2510 Atherosclerotic heart disease of native coronary artery without angina pectoris: Secondary | ICD-10-CM | POA: Diagnosis not present

## 2016-02-15 DIAGNOSIS — I255 Ischemic cardiomyopathy: Secondary | ICD-10-CM | POA: Diagnosis not present

## 2016-02-15 DIAGNOSIS — Z955 Presence of coronary angioplasty implant and graft: Secondary | ICD-10-CM

## 2016-02-15 DIAGNOSIS — N182 Chronic kidney disease, stage 2 (mild): Secondary | ICD-10-CM | POA: Diagnosis not present

## 2016-02-15 DIAGNOSIS — I10 Essential (primary) hypertension: Secondary | ICD-10-CM

## 2016-02-15 DIAGNOSIS — I214 Non-ST elevation (NSTEMI) myocardial infarction: Secondary | ICD-10-CM

## 2016-02-15 DIAGNOSIS — E7800 Pure hypercholesterolemia, unspecified: Secondary | ICD-10-CM

## 2016-02-15 LAB — LIPID PANEL
CHOL/HDL RATIO: 4.1 ratio
CHOLESTEROL: 94 mg/dL (ref 0–200)
HDL: 23 mg/dL — ABNORMAL LOW (ref >40)
LDL Cholesterol: 58 mg/dL (ref 0–99)
Triglycerides: 64 mg/dL (ref <150)
VLDL: 13 mg/dL (ref 0–40)

## 2016-02-15 LAB — CBC
HCT: 38.6 % — ABNORMAL LOW (ref 40.0–52.0)
HEMOGLOBIN: 13.6 g/dL (ref 13.0–18.0)
MCH: 32.4 pg (ref 26.0–34.0)
MCHC: 35.2 g/dL (ref 32.0–36.0)
MCV: 92.1 fL (ref 80.0–100.0)
PLATELETS: 282 10*3/uL (ref 150–440)
RBC: 4.19 MIL/uL — AB (ref 4.40–5.90)
RDW: 12.9 % (ref 11.5–14.5)
WBC: 8 10*3/uL (ref 3.8–10.6)

## 2016-02-15 LAB — ECHOCARDIOGRAM COMPLETE
Height: 70 in
Weight: 2382.4 oz

## 2016-02-15 LAB — GLUCOSE, CAPILLARY
Glucose-Capillary: 218 mg/dL — ABNORMAL HIGH (ref 65–99)
Glucose-Capillary: 209 mg/dL — ABNORMAL HIGH (ref 65–99)

## 2016-02-15 LAB — BASIC METABOLIC PANEL
ANION GAP: 8 (ref 5–15)
BUN: 31 mg/dL — ABNORMAL HIGH (ref 6–20)
CHLORIDE: 107 mmol/L (ref 101–111)
CO2: 26 mmol/L (ref 22–32)
CREATININE: 1.6 mg/dL — AB (ref 0.61–1.24)
Calcium: 8.8 mg/dL — ABNORMAL LOW (ref 8.9–10.3)
GFR calc non Af Amer: 44 mL/min — ABNORMAL LOW (ref >60)
GFR, EST AFRICAN AMERICAN: 51 mL/min — AB (ref >60)
Glucose, Bld: 253 mg/dL — ABNORMAL HIGH (ref 65–99)
POTASSIUM: 3.6 mmol/L (ref 3.5–5.1)
SODIUM: 141 mmol/L (ref 135–145)

## 2016-02-15 LAB — TROPONIN I: TROPONIN I: 0.05 ng/mL — AB (ref <0.03)

## 2016-02-15 MED ORDER — GLUCERNA SHAKE PO LIQD
237.0000 mL | Freq: Three times a day (TID) | ORAL | Status: DC
  Administered 2016-02-15: 237 mL via ORAL

## 2016-02-15 MED ORDER — POTASSIUM CHLORIDE CRYS ER 20 MEQ PO TBCR: 20.0000 meq | EXTENDED_RELEASE_TABLET | Freq: Once | ORAL | Status: DC

## 2016-02-15 MED ORDER — POTASSIUM CHLORIDE CRYS ER 20 MEQ PO TBCR
20.0000 meq | EXTENDED_RELEASE_TABLET | Freq: Once | ORAL | Status: AC
  Administered 2016-02-15: 20 meq via ORAL
  Filled 2016-02-15: qty 1

## 2016-02-15 NOTE — Progress Notes (Signed)
   Spoke with LifeVest rep, Rachel Litts, regarding patient stating LifeVest is malfunctioning. She will have repKimber Relic come out [facility] this afternoon to evaluate. Patient's wife is bringing in the Vest this morning. Will need to keep inpatient until this is fully evaluated.

## 2016-02-15 NOTE — Progress Notes (Signed)
   Discussed timing of LifeVest rep coming out around 6 PM with patient and hospitalist as well as echo findings. If LifeVest clears patient for discharge after evaluating the device patient can then be discharged from a cardiac standpoint.

## 2016-02-15 NOTE — Discharge Summary (Signed)
[facility] [place] [facility]   PATIENT NAME: Damon [name]    MR#:  [id number]  DATE OF BIRTH:  1951-04-09  DATE OF ADMISSION:  02/14/2016   ADMITTING PHYSICIAN: Alford Highlandichard Wieting, MD  DATE OF DISCHARGE: 02/15/2016  1:58 PM  PRIMARY CARE PHYSICIAN: Karie FetchAYCOCK, [name], MD   ADMISSION DIAGNOSIS:  Shortness of breath [R06.02] NSTEMI (non-ST elevated myocardial infarction) (HCC) [I21.4] DISCHARGE DIAGNOSIS:  Principal Problem:   SOB (shortness of breath) Active Problems:   CAD (coronary artery disease)   Essential hypertension   HLD (hyperlipidemia)   Cardiomyopathy, ischemic   CKD (chronic kidney disease), stage II  SECONDARY DIAGNOSIS:   Past Medical History:  Diagnosis Date  . CKD (chronic kidney disease), stage III   . Diabetes mellitus without complication (HCC)   . Diverticulitis   . Essential hypertension   . MI (myocardial infarction)   . PAD (peripheral artery disease) (HCC)    a. 10/2015 s/p PTA and stenting of the RCIA, LCIA, and PTA of the LEIA Environmental manager([place]).  . Tobacco abuse    HOSPITAL COURSE:   1. Shortness of breath with borderline troponin and recent myocardial infarction and stent. stable troponins, per cardiology consultation, Life Vest, continue aspirin, Brilinta, Coreg, and atorvastatin. Patient not having any symptoms at this point. Preliminary EF on echo today is improved to approximately 45-50%. Recommend EP evaluation as an outpatient.   2. Essential hypertension continue usual medications 3. Chronic kidney disease stage III continue to monitor with diuresis 4. Cardiomyopathy. Patient states that his LifeVest is not working. We'll get care manager consultation and this will have to be monitored either here or as outpatient. 5. Hyperlipidemia unspecified continue atorvastatin 6. Type 2 diabetes mellitus. Half dose of Lantus in the morning because the patient is given a be nothing by mouth. Last hemoglobin A1c 8.5. Sliding scale insulin  ordered while here. 7. Tobacco abuse. Smoking cessation counseling done 3 minutes by me. Nicotine patch ordered. I discussed with cardiologist PA, per [name],  Dr. Mariah MillingGollan agrees to discharge the patient. DISCHARGE CONDITIONS:  Stable, discharged to home today. CONSULTS OBTAINED:  Treatment Team:  Antonieta Ibaimothy J Gollan, MD DRUG ALLERGIES:  No Known Allergies DISCHARGE MEDICATIONS:     Medication List    TAKE these medications   aspirin EC 81 MG tablet Take 1 tablet (81 mg total) by mouth daily.   atorvastatin 80 MG tablet Commonly known as:  LIPITOR Take 1 tablet (80 mg total) by mouth daily at 6 PM. What changed:  when to take this   carvedilol 25 MG tablet Commonly known as:  COREG Take 1 tablet (25 mg total) by mouth 2 (two) times daily with a meal.   furosemide 20 MG tablet Commonly known as:  LASIX Take 1 tablet (20 mg total) by mouth every other day.   gabapentin 300 MG capsule Commonly known as:  NEURONTIN Take 300 mg by mouth 2 (two) times daily.   insulin glargine 100 UNIT/ML injection Commonly known as:  LANTUS Inject 30 Units into the skin daily.   lisinopril 20 MG tablet Commonly known as:  PRINIVIL,ZESTRIL Take 1 tablet (20 mg total) by mouth daily.   spironolactone 25 MG tablet Commonly known as:  ALDACTONE Take 1 tablet (25 mg total) by mouth daily.   ticagrelor 90 MG Tabs tablet Commonly known as:  BRILINTA Take 1 tablet (90 mg total) by mouth 2 (two) times daily.        DISCHARGE INSTRUCTIONS:  See AVS.   If you experience worsening of your admission symptoms, develop shortness of breath, life threatening emergency, suicidal or homicidal thoughts you must seek medical attention immediately by calling 911 or calling your MD immediately  if symptoms less severe.  You Must read complete instructions/literature along with all the possible adverse reactions/side effects for all the Medicines you take and that have been prescribed to you. Take any  new Medicines after you have completely understood and accpet all the possible adverse reactions/side effects.   Please note  You were cared for by a hospitalist during your hospital stay. If you have any questions about your discharge medications or the care you received while you were in the hospital after you are discharged, you can call the unit and asked to speak with the hospitalist on call if the hospitalist that took care of you is not available. Once you are discharged, your primary care physician will handle any further medical issues. Please note that NO REFILLS for any discharge medications will be authorized once you are discharged, as it is imperative that you return to your primary care physician (or establish a relationship with a primary care physician if you do not have one) for your aftercare needs so that they can reassess your need for medications and monitor your lab values.    On the day of Discharge:  VITAL SIGNS:  Blood pressure (!) 114/57, pulse 66, temperature 97.4 F (36.3 C), temperature source Oral, resp. rate 20, height 5\' 10"  (1.778 m), weight 148 lb 14.4 oz (67.5 kg), SpO2 100 %. PHYSICAL EXAMINATION:  GENERAL:  65 y.o.-year-old patient lying in the bed with no acute distress.  EYES: Pupils equal, round, reactive to light and accommodation. No scleral icterus. Extraocular muscles intact.  HEENT: Head atraumatic, normocephalic. Oropharynx and nasopharynx clear.  NECK:  Supple, no jugular venous distention. No thyroid enlargement, no tenderness.  LUNGS: Normal breath sounds bilaterally, no wheezing, rales,rhonchi or crepitation. No use of accessory muscles of respiration.  CARDIOVASCULAR: S1, S2 normal. No murmurs, rubs, or gallops.  ABDOMEN: Soft, non-tender, non-distended. Bowel sounds present. No organomegaly or mass.  EXTREMITIES: No pedal edema, cyanosis, or clubbing.  NEUROLOGIC: Cranial nerves II through XII are intact. Muscle strength 5/5 in all  extremities. Sensation intact. Gait not checked.  PSYCHIATRIC: The patient is alert and oriented x 3.  SKIN: No obvious rash, lesion, or ulcer.  DATA REVIEW:   CBC  Recent Labs Lab 02/15/16 0552  WBC 8.0  HGB 13.6  HCT 38.6*  PLT 282    Chemistries   Recent Labs Lab 02/15/16 0552  NA 141  K 3.6  CL 107  CO2 26  GLUCOSE 253*  BUN 31*  CREATININE 1.60*  CALCIUM 8.8*     Microbiology Results  Results for orders placed or performed during the hospital encounter of 01/13/16  Culture, blood (routine x 2)     Status: None   Collection Time: 01/13/16  9:36 AM  Result Value Ref Range Status   Specimen Description BLOOD RIGHT ANTECUBITAL  Final   Special Requests BOTTLES DRAWN AEROBIC AND ANAEROBIC  7CC  Final   Culture NO GROWTH 5 DAYS  Final   Report Status 01/18/2016 FINAL  Final  Culture, blood (routine x 2)     Status: None   Collection Time: 01/13/16  9:36 AM  Result Value Ref Range Status   Specimen Description BLOOD RIGHT FOREARM  Final   Special Requests BOTTLES DRAWN AEROBIC AND ANAEROBIC 10CC  Final   Culture NO GROWTH 5 DAYS  Final   Report Status 01/18/2016 FINAL  Final  Urine culture     Status: None   Collection Time: 01/13/16  9:36 AM  Result Value Ref Range Status   Specimen Description URINE, CATHETERIZED  Final   Special Requests Normal  Final   Culture NO GROWTH Performed at [facility]   Final   Report Status 01/14/2016 FINAL  Final  MRSA PCR Screening     Status: None   Collection Time: 01/13/16  1:46 PM  Result Value Ref Range Status   MRSA by PCR NEGATIVE NEGATIVE Final    Comment:        The GeneXpert MRSA Assay (FDA approved for NASAL specimens only), is one component of a comprehensive MRSA colonization surveillance program. It is not intended to diagnose MRSA infection nor to guide or monitor treatment for MRSA infections.     RADIOLOGY:  Dg Chest 2 View  Result Date: 02/14/2016 CLINICAL DATA:  65 year old male  with shortness breath. Myocardial infarction 4 weeks ago. Hypertension. Diabetes. Smoker. Subsequent encounter. EXAM: CHEST  2 VIEW COMPARISON:  01/15/2016, 03/24/2015 and 04/14/2008. FINDINGS: Hyperinflation of the lungs with central pulmonary vascular prominence stable. No infiltrate, congestive heart failure or pneumothorax. No plain film evidence of pulmonary malignancy. Heart size within normal limits. Calcified aorta. Postsurgical changes cervical spine. Degenerative changes thoracic spine and acromioclavicular joint. IMPRESSION: Hyperinflated lungs without acute pulmonary abnormality. Aortic atherosclerosis. Electronically Signed   By: Lacy Duverney M.D.   On: 02/14/2016 17:25     Management plans discussed with the patient, His wife and they are in agreement.  CODE STATUS:     Code Status Orders        Start     Ordered   02/14/16 1811  Full code  Continuous     02/14/16 1810    Code Status History    Date Active Date Inactive Code Status Order ID Comments User Context   01/13/2016  9:42 AM 01/17/2016 11:47 PM Full Code [id number]  Eugenie Norrie, NP ED      TOTAL TIME TAKING CARE OF THIS PATIENT: 36 minutes.    Shaune Pollack M.D on 02/15/2016 at 3:31 PM  Between 7am to 6pm - Pager - [phone]  After 6pm go to [email or web address] - Social research officer, government  [facility]  Office  [phone]  CC: Primary care physician; Emogene Morgan, MD   Note: This dictation was prepared with Dragon dictation along with smaller phrase technology. Any transcriptional errors that result from this process are unintentional.

## 2016-02-15 NOTE — Discharge Instructions (Signed)
Heart healthy and ADA diet. °Smoking cessation. °

## 2016-02-15 NOTE — Progress Notes (Signed)
Initial Nutrition Assessment  DOCUMENTATION CODES:   Non-severe (moderate) malnutrition in context of chronic illness  INTERVENTION:  1. Glucerna Shake po TID, each supplement provides 220 kcal and 10 grams of protein  2. Orange creamsickle Magic cup TID with meals, each supplement provides 290 kcal and 9 grams of protein  3. Will downgrade patient's diet to NDD3, admits to issues with "food going down."   NUTRITION DIAGNOSIS:   Malnutrition related to chronic illness as evidenced by moderate depletions of muscle mass, moderate depletion of body fat.  GOAL:   Patient will meet greater than or equal to 90% of their needs   MONITOR:   PO intake, I & O's, Labs, Weight trends, Supplement acceptance  REASON FOR ASSESSMENT:   Malnutrition Screening Tool    ASSESSMENT:   Damon [name]  is a 65 y.o. male with a known history of CAD with recent MI and stents. He presents to the ER with shortness of breath that happened today around 3 PM and lasted for about 2 hours.  Mr. Damon [name] is a pleasant gentleman who presents with 27#/15.4% severe wt loss over 3 months. He states at home he has issues with early satiety as well as foods "not going down," in reference to swallowing. He admits to eating 1-2 meals per day and often only eating a few bites before becoming full.  Did not eat breakfast this morning.  Nutrition-Focused physical exam completed. Findings are moderate fat depletion, moderate muscle depletion, and no edema.   Labs and medications reviewed: CBGs 218-251   Diet Order:  Diet Heart Room service appropriate? Yes; Fluid consistency: Thin  Skin:  Reviewed, no issues  Last BM:  PTA  Height:   Ht Readings from Last 1 Encounters:  02/14/16 5\' 10"  (1.778 m)    Weight:   Wt Readings from Last 1 Encounters:  02/14/16 148 lb 14.4 oz (67.5 kg)    Ideal Body Weight:  75.45 kg  BMI:  Body mass index is 21.36 kg/m.  Estimated Nutritional Needs:   Kcal:  1681-2000  calories  Protein:  80-95 gm  Fluid:  >/= 1.7L  EDUCATION NEEDS:   No education needs identified at this time  Dionne AnoWilliam M. Airik Goodlin, MS, RD LDN Inpatient Clinical Dietitian Pager [phone]

## 2016-02-15 NOTE — Progress Notes (Signed)
Cardiac Individual Treatment Plan  Patient Details  Name: Damon [name] MRN: [id number] Date of Birth: [birth year] Referring Provider:   Flowsheet Row Cardiac Rehab from 02/06/2016 in [facility] Cardiac and Pulmonary Rehab  Referring Provider  Lorine Bears MD      Initial Encounter Date:  Flowsheet Row Cardiac Rehab from 02/06/2016 in [facility] Cardiac and Pulmonary Rehab  Date  02/06/16  Referring Provider  Lorine Bears MD      Visit Diagnosis: NSTEMI (non-ST elevated myocardial infarction) [facility])  Status post coronary artery stent placement  Patient's Home Medications on Admission: No current facility-administered medications for this visit.  No current outpatient prescriptions on file.  Facility-Administered Medications Ordered in Other Visits:  .  acetaminophen (TYLENOL) tablet 650 mg, 650 mg, Oral, Q6H PRN **OR** acetaminophen (TYLENOL) suppository 650 mg, 650 mg, Rectal, Q6H PRN, Alford Highland, MD .  aspirin EC tablet 81 mg, 81 mg, Oral, Daily, Alford Highland, MD .  atorvastatin (LIPITOR) tablet 80 mg, 80 mg, Oral, QHS, Alford Highland, MD, 80 mg at 02/14/16 2156 .  bisacodyl (DULCOLAX) EC tablet 10 mg, 10 mg, Oral, Once, Alford Highland, MD .  carvedilol (COREG) tablet 25 mg, 25 mg, Oral, BID WC, Alford Highland, MD .  diphenhydrAMINE (BENADRYL) capsule 25 mg, 25 mg, Oral, QHS PRN, Oralia Manis, MD, 25 mg at 02/14/16 2156 .  enoxaparin (LOVENOX) injection 40 mg, 40 mg, Subcutaneous, Q24H, Alford Highland, MD, 40 mg at 02/14/16 2158 .  furosemide (LASIX) tablet 20 mg, 20 mg, Oral, Daily, Alford Highland, MD .  gabapentin (NEURONTIN) capsule 300 mg, 300 mg, Oral, BID, Alford Highland, MD, 300 mg at 02/14/16 2156 .  insulin aspart (novoLOG) injection 0-5 Units, 0-5 Units, Subcutaneous, QHS, Alford Highland, MD, 3 Units at 02/14/16 2157 .  insulin aspart (novoLOG) injection 0-9 Units, 0-9 Units, Subcutaneous, TID WC, Alford Highland, MD .  insulin glargine (LANTUS) injection 15  Units, 15 Units, Subcutaneous, Daily, Alford Highland, MD .  lisinopril (PRINIVIL,ZESTRIL) tablet 20 mg, 20 mg, Oral, Daily, Alford Highland, MD .  nicotine (NICODERM CQ - dosed in mg/24 hours) patch 14 mg, 14 mg, Transdermal, Daily, Alford Highland, MD .  sodium chloride flush (NS) 0.9 % injection 3 mL, 3 mL, Intravenous, Q12H, Alford Highland, MD, 3 mL at 02/14/16 2158 .  spironolactone (ALDACTONE) tablet 25 mg, 25 mg, Oral, Daily, Alford Highland, MD .  ticagrelor [facility]) tablet 90 mg, 90 mg, Oral, BID, Alford Highland, MD, 90 mg at 02/14/16 2156  Past Medical History: Past Medical History:  Diagnosis Date  . CKD (chronic kidney disease), stage III   . Diabetes mellitus without complication (HCC)   . Diverticulitis   . Essential hypertension   . MI (myocardial infarction)   . PAD (peripheral artery disease) (HCC)    a. 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  . Tobacco abuse     Tobacco Use: History  Smoking Status  . Current Every Day Smoker  . Packs/day: 0.50  . Years: 15.00  . Types: Cigarettes  Smokeless Tobacco  . Current User    Labs: Recent Review Flowsheet Data    Labs for ITP Cardiac and Pulmonary Rehab Latest Ref Rng & Units 11/16/2014 01/13/2016 01/14/2016 01/16/2016 02/15/2016   Cholestrol 0 - 200 mg/dL - - - - 94   LDLCALC 0 - 99 mg/dL - - - - 58   HDL >40 mg/dL - - - - 98(J)   Trlycerides <150 mg/dL - - - -  64   Hemoglobin A1c 4.8 - 5.6 % - - - 8.3(H) -   PHART 7.350 - 7.450 - 7.16(LL) 7.40 - -   PCO2ART 32.0 - 48.0 mmHg - 57(H) 33 - -   HCO3 20.0 - 28.0 mmol/L 22.0 20.3 20.4 - -   ACIDBASEDEF 0.0 - 2.0 mmol/L 3.1(H) 8.9(H) 3.5(H) - -   O2SAT % - 99.9 98.1 - -       Exercise Target Goals:    Exercise Program Goal: Individual exercise prescription set with THRR, safety & activity barriers. Participant demonstrates ability to understand and report RPE using BORG scale, to self-measure pulse accurately, and to  acknowledge the importance of the exercise prescription.  Exercise Prescription Goal: Starting with aerobic activity 30 plus minutes a day, 3 days per week for initial exercise prescription. Provide home exercise prescription and guidelines that participant acknowledges understanding prior to discharge.  Activity Barriers & Risk Stratification:     Activity Barriers & Cardiac Risk Stratification - 02/06/16 1453      Activity Barriers & Cardiac Risk Stratification   Activity Barriers Shortness of Breath;Balance Concerns;History of Falls;Assistive Device;Deconditioning;Muscular Weakness;Decreased Ventricular Function  wears a life vest, weight of battery pack sometimes makes Francis DowseJoel feel off balance, scar tissue on bottom of feet, gait unsteady with shuffling/toe catching on swing through of left leg   Cardiac Risk Stratification High      6 Minute Walk:     6 Minute Walk    Row Name 02/06/16 1555         6 Minute Walk   Phase Initial     Distance 260 feet     Walk Time 3.1 minutes     # of Rest Breaks 1  3:31-5:25     MPH 0.98     METS 1.73     RPE 17     Perceived Dyspnea  4     VO2 Peak 6.08     Symptoms Yes (comment)     Comments leg fatigue, SOB, drags left foot causes stumbles in gait     Resting HR 87 bpm     Resting BP 136/66     Max Ex. HR 104 bpm     Max Ex. BP 128/60     2 Minute Post BP 124/70       Interval HR   Baseline HR 87     1 Minute HR 96     2 Minute HR 96     3 Minute HR 101     4 Minute HR 98     5 Minute HR 96     6 Minute HR 104     2 Minute Post HR 85     Interval Heart Rate? Yes       Interval Oxygen   Interval Oxygen? Yes     Baseline Oxygen Saturation % 99 %     Baseline Liters of Oxygen 0 L  Room Air     1 Minute Oxygen Saturation % 100 %     1 Minute Liters of Oxygen 0 L     2 Minute Oxygen Saturation % 99 %     2 Minute Liters of Oxygen 0 L     3 Minute Oxygen Saturation % 97 %     3 Minute Liters of Oxygen 0 L     4 Minute  Oxygen Saturation % 98 %     4 Minute Liters of Oxygen 0 L  6 Minute Oxygen Saturation % 99 %     6 Minute Liters of Oxygen 0 L        Initial Exercise Prescription:     Initial Exercise Prescription - 02/06/16 1500      Date of Initial Exercise RX and Referring Provider   Date 02/06/16   Referring Provider Lorine Bears MD     Recumbant Bike   Level 1   RPM 35   Minutes 15   METs 2     NuStep   Level 1   Watts --  60-80 spm   Minutes 15   METs 2     Biostep-RELP   Level 1   Watts --  40-50 spm   Minutes 15   METs 2     Prescription Details   Frequency (times per week) 2   Duration Progress to 45 minutes of aerobic exercise without signs/symptoms of physical distress     Intensity   THRR 40-80% of Max Heartrate 115-143   Ratings of Perceived Exertion 11-15   Perceived Dyspnea 0-4     Progression   Progression Continue to progress workloads to maintain intensity without signs/symptoms of physical distress.     Resistance Training   Training Prescription Yes   Weight 2 lbs   Reps 10-12      Perform Capillary Blood Glucose checks as needed.  Exercise Prescription Changes:     Exercise Prescription Changes    Row Name 02/06/16 1400             Exercise Review   Progression -  Walk Test Results         Response to Exercise   Blood Pressure (Admit) 136/66       Blood Pressure (Exercise) 128/60       Blood Pressure (Exit) 124/70       Heart Rate (Admit) 87 bpm       Heart Rate (Exercise) 104 bpm       Heart Rate (Exit) 85 bpm       Oxygen Saturation (Admit) 99 %       Oxygen Saturation (Exercise) 99 %       Rating of Perceived Exertion (Exercise) 17       Perceived Dyspnea (Exercise) 4       Symptoms Leg fatigue, SOB, tripping with dragged left foot          Exercise Comments:     Exercise Comments    Row Name 02/06/16 1559           Exercise Comments Murrell wants to be able to walk better without SOB, improve his strength and  stamina, and get back to fishing and doing things around the house.          Discharge Exercise Prescription (Final Exercise Prescription Changes):     Exercise Prescription Changes - 02/06/16 1400      Exercise Review   Progression --  Walk Test Results     Response to Exercise   Blood Pressure (Admit) 136/66   Blood Pressure (Exercise) 128/60   Blood Pressure (Exit) 124/70   Heart Rate (Admit) 87 bpm   Heart Rate (Exercise) 104 bpm   Heart Rate (Exit) 85 bpm   Oxygen Saturation (Admit) 99 %   Oxygen Saturation (Exercise) 99 %   Rating of Perceived Exertion (Exercise) 17   Perceived Dyspnea (Exercise) 4   Symptoms Leg fatigue, SOB, tripping with dragged left foot  Nutrition:  Target Goals: Understanding of nutrition guidelines, daily intake of sodium 1500mg , cholesterol 200mg , calories 30% from fat and 7% or less from saturated fats, daily to have 5 or more servings of fruits and vegetables.  Biometrics:     Pre Biometrics - 02/06/16 1600      Pre Biometrics   Height 5' 8.6" (1.742 m)   Weight 159 lb 1.6 oz (72.2 kg)   Waist Circumference 35 inches   Hip Circumference 37.5 inches   Waist to Hip Ratio 0.93 %   BMI (Calculated) 23.8   Single Leg Stand 0.89 seconds       Nutrition Therapy Plan and Nutrition Goals:     Nutrition Therapy & Goals - 02/06/16 1447      Intervention Plan   Intervention Prescribe, educate and counsel regarding individualized specific dietary modifications aiming towards targeted core components such as weight, hypertension, lipid management, diabetes, heart failure and other comorbidities.   Expected Outcomes Short Term Goal: Understand basic principles of dietary content, such as calories, fat, sodium, cholesterol and nutrients.;Short Term Goal: A plan has been developed with personal nutrition goals set during dietitian appointment.;Long Term Goal: Adherence to prescribed nutrition plan.      Nutrition Discharge: Rate Your  Plate Scores:     Nutrition Assessments - 02/06/16 1448      Rate Your Plate Scores   Pre Score 56   Pre Score % 62 %      Nutrition Goals Re-Evaluation:   Psychosocial: Target Goals: Acknowledge presence or absence of depression, maximize coping skills, provide positive support system. Participant is able to verbalize types and ability to use techniques and skills needed for reducing stress and depression.  Initial Review & Psychosocial Screening:     Initial Psych Review & Screening - 02/06/16 1450      Initial Review   Current issues with Current Depression  States a bit of depression noted.  Starts the day feeling fine and gets a bit depressed later in the day. Hopes that getting started with exercise routine will help him feel better throughout the day.     Family Dynamics   Good Support System? Yes  Wife     Barriers   Psychosocial barriers to participate in program There are no identifiable barriers or psychosocial needs.;The patient should benefit from training in stress management and relaxation.     Screening Interventions   Interventions Encouraged to exercise      Quality of Life Scores:     Quality of Life - 02/06/16 1452      Quality of Life Scores   Health/Function Pre 18.87 %   Socioeconomic Pre 19.5 %   Psych/Spiritual Pre 20 %   Family Pre 20.25 %   GLOBAL Pre 19.41 %      PHQ-9: Recent Review Flowsheet Data    Depression screen PHQ 2/9 02/06/2016   Decreased Interest 1   Down, Depressed, Hopeless 1   PHQ - 2 Score 2   Altered sleeping 3    Tired, decreased energy 3   Change in appetite 2   Feeling bad or failure about yourself  2   Trouble concentrating 2   Moving slowly or fidgety/restless 3   Suicidal thoughts 0   PHQ-9 Score 17   Difficult doing work/chores Somewhat difficult      Psychosocial Evaluation and Intervention:   Psychosocial Re-Evaluation:   Vocational Rehabilitation: Provide vocational rehab assistance to  qualifying candidates.   Vocational Rehab Evaluation &  Intervention:     Vocational Rehab - 02/06/16 1455      Initial Vocational Rehab Evaluation & Intervention   Assessment shows need for Vocational Rehabilitation No      Education: Education Goals: Education classes will be provided on a weekly basis, covering required topics. Participant will state understanding/return demonstration of topics presented.  Learning Barriers/Preferences:     Learning Barriers/Preferences - 02/06/16 1454      Learning Barriers/Preferences   Learning Barriers Inability to learn new things;Reading   Learning Preferences Group Instruction;Individual Instruction;Video      Education Topics: General Nutrition Guidelines/Fats and Fiber: -Group instruction provided by verbal, written material, models and posters to present the general guidelines for heart healthy nutrition. Gives an explanation and review of dietary fats and fiber.   Controlling Sodium/Reading Food Labels: -Group verbal and written material supporting the discussion of sodium use in heart healthy nutrition. Review and explanation with models, verbal and written materials for utilization of the food label.   Exercise Physiology & Risk Factors: - Group verbal and written instruction with models to review the exercise physiology of the cardiovascular system and associated critical values. Details cardiovascular disease risk factors and the goals associated with each risk factor.   Aerobic Exercise & Resistance Training: - Gives group verbal and written discussion on the health impact of inactivity. On the components of aerobic and resistive training programs and the benefits of this training and how to safely progress through these programs.   Flexibility, Balance, General Exercise Guidelines: - Provides group verbal and written instruction on the benefits of flexibility and balance training programs. Provides general exercise  guidelines with specific guidelines to those with heart or lung disease. Demonstration and skill practice provided.   Stress Management: - Provides group verbal and written instruction about the health risks of elevated stress, cause of high stress, and healthy ways to reduce stress.   Depression: - Provides group verbal and written instruction on the correlation between heart/lung disease and depressed mood, treatment options, and the stigmas associated with seeking treatment.   Anatomy & Physiology of the Heart: - Group verbal and written instruction and models provide basic cardiac anatomy and physiology, with the coronary electrical and arterial systems. Review of: AMI, Angina, Valve disease, Heart Failure, Cardiac Arrhythmia, Pacemakers, and the ICD.   Cardiac Procedures: - Group verbal and written instruction and models to describe the testing methods done to diagnose heart disease. Reviews the outcomes of the test results. Describes the treatment choices: Medical Management, Angioplasty, or Coronary Bypass Surgery.   Cardiac Medications: - Group verbal and written instruction to review commonly prescribed medications for heart disease. Reviews the medication, class of the drug, and side effects. Includes the steps to properly store meds and maintain the prescription regimen.   Go Sex-Intimacy & Heart Disease, Get SMART - Goal Setting: - Group verbal and written instruction through game format to discuss heart disease and the return to sexual intimacy. Provides group verbal and written material to discuss and apply goal setting through the application of the S.M.A.R.T. Method.   Other Matters of the Heart: - Provides group verbal, written materials and models to describe Heart Failure, Angina, Valve Disease, and Diabetes in the realm of heart disease. Includes description of the disease process and treatment options available to the cardiac patient.   Exercise & Equipment  Safety: - Individual verbal instruction and demonstration of equipment use and safety with use of the equipment. Flowsheet Row Cardiac Rehab from 02/06/2016 in  [facility] Cardiac and Pulmonary Rehab  Date  02/06/16  Educator  SB  Instruction Review Code  2- meets goals/outcomes      Infection Prevention: - Provides verbal and written material to individual with discussion of infection control including proper hand washing and proper equipment cleaning during exercise session. Flowsheet Row Cardiac Rehab from 02/06/2016 in [facility] Cardiac and Pulmonary Rehab  Date  02/06/16  Educator  SB  Instruction Review Code  2- meets goals/outcomes      Falls Prevention: - Provides verbal and written material to individual with discussion of falls prevention and safety. Flowsheet Row Cardiac Rehab from 02/06/2016 in [facility] Cardiac and Pulmonary Rehab  Date  02/06/16  Educator  SB  Instruction Review Code  2- meets goals/outcomes      Diabetes: - Individual verbal and written instruction to review signs/symptoms of diabetes, desired ranges of glucose level fasting, after meals and with exercise. Advice that pre and post exercise glucose checks will be done for 3 sessions at entry of program. Flowsheet Row Cardiac Rehab from 02/06/2016 in [facility] Cardiac and Pulmonary Rehab  Date  02/06/16  Educator  SB  Instruction Review Code  2- meets goals/outcomes       Knowledge Questionnaire Score:     Knowledge Questionnaire Score - 02/06/16 1455      Knowledge Questionnaire Score   Pre Score 20/28      Core Components/Risk Factors/Patient Goals at Admission:     Personal Goals and Risk Factors at Admission - 02/06/16 1448      Core Components/Risk Factors/Patient Goals on Admission    Weight Management Yes;Weight Maintenance   Intervention Weight Management: Develop a combined nutrition and exercise program designed to reach desired caloric intake, while maintaining appropriate intake of nutrient  and fiber, sodium and fats, and appropriate energy expenditure required for the weight goal.;Weight Management: Provide education and appropriate resources to help participant work on and attain dietary goals.   Admit Weight 159 lb 1.6 oz (72.2 kg)   Expected Outcomes Short Term: Continue to assess and modify interventions until short term weight is achieved;Long Term: Adherence to nutrition and physical activity/exercise program aimed toward attainment of established weight goal;Weight Maintenance: Understanding of the daily nutrition guidelines, which includes 25-35% calories from fat, 7% or less cal from saturated fats, less than 200mg  cholesterol, less than 1.5gm of sodium, & 5 or more servings of fruits and vegetables daily   Sedentary Yes   Intervention Provide advice, education, support and counseling about physical activity/exercise needs.;Develop an individualized exercise prescription for aerobic and resistive training based on initial evaluation findings, risk stratification, comorbidities and participant's personal goals.   Expected Outcomes Achievement of increased cardiorespiratory fitness and enhanced flexibility, muscular endurance and strength shown through measurements of functional capacity and personal statement of participant.   Increase Strength and Stamina Yes   Intervention Provide advice, education, support and counseling about physical activity/exercise needs.;Develop an individualized exercise prescription for aerobic and resistive training based on initial evaluation findings, risk stratification, comorbidities and participant's personal goals.   Expected Outcomes Achievement of increased cardiorespiratory fitness and enhanced flexibility, muscular endurance and strength shown through measurements of functional capacity and personal statement of participant.   Tobacco Cessation Yes   Intervention Assist the participant in steps to quit. Provide individualized education and  counseling about committing to Tobacco Cessation, relapse prevention, and pharmacological support that can be provided by physician.;Education officer, environmental, assist with locating and accessing local/national Quit Smoking programs, and support quit date  choice.   Expected Outcomes Short Term: Will demonstrate readiness to quit, by selecting a quit date.;Short Term: Will quit all tobacco product use, adhering to prevention of relapse plan.;Long Term: Complete abstinence from all tobacco products for at least 12 months from quit date.  Quit date set to be 02/12/2016. Has patches and nicotine gum available to help with cessation of tobacco.   Diabetes Yes   Intervention Provide education about proper nutrition, including hydration, and aerobic/resistive exercise prescription along with prescribed medications to achieve blood glucose in normal ranges: Fasting glucose 65-99 mg/dL;Provide education about signs/symptoms and action to take for hypo/hyperglycemia.   Expected Outcomes Short Term: Participant verbalizes understanding of the signs/symptoms and immediate care of hyper/hypoglycemia, proper foot care and importance of medication, aerobic/resistive exercise and nutrition plan for blood glucose control.;Long Term: Attainment of HbA1C < 7%.   Hypertension Yes   Intervention Provide education on lifestyle modifcations including regular physical activity/exercise, weight management, moderate sodium restriction and increased consumption of fresh fruit, vegetables, and low fat dairy, alcohol moderation, and smoking cessation.;Monitor prescription use compliance.   Expected Outcomes Short Term: Continued assessment and intervention until BP is < 140/27mm HG in hypertensive participants. < 130/57mm HG in hypertensive participants with diabetes, heart failure or chronic kidney disease.;Long Term: Maintenance of blood pressure at goal levels.   Lipids Yes   Intervention Provide education and support for  participant on nutrition & aerobic/resistive exercise along with prescribed medications to achieve LDL 70mg , HDL >40mg .   Expected Outcomes Short Term: Participant states understanding of desired cholesterol values and is compliant with medications prescribed. Participant is following exercise prescription and nutrition guidelines.;Long Term: Cholesterol controlled with medications as prescribed, with individualized exercise RX and with personalized nutrition plan. Value goals: LDL < 70mg , HDL > 40 mg.      Core Components/Risk Factors/Patient Goals Review:    Core Components/Risk Factors/Patient Goals at Discharge (Final Review):    ITP Comments:     ITP Comments    Row Name 02/06/16 1443 02/15/16 0704         ITP Comments Medical review done with Initial ITP completed.  Diagnosis documentation can be found [facility] 01/13/2016 Hospital Encounter 30 day review completed for Medical Director physician review and signature. Continue ITP unless changes made by physician.         Comments:

## 2016-02-15 NOTE — Progress Notes (Signed)
*  PRELIMINARY RESULTS* Echocardiogram 2D Echocardiogram has been performed.  Damon [name], Damon [name] 02/15/2016, 10:57 AM

## 2016-02-15 NOTE — Care Management (Signed)
CM consult present to address "malfunction of Life Vest."  Left VM message to BradshawRachel.  After this call was made, found in documentation that cardiology has also contacted the representative and patient's wife is to bring the vest onsite for evaluation.

## 2016-02-15 NOTE — Progress Notes (Signed)
    LifeVest tech was able to trouble shoot issue over the phone (needed to clean leads). Device is functioning without issues at this time. IM notified, cardiology MD notified, care manager notified, floor notified.

## 2016-02-15 NOTE — Progress Notes (Addendum)
   Preliminary EF on echo today is improved to approximately 45-50%. Will await formal read by MD. Discussed with MD. Given severe LVH and apical HK/AK would continue LifeVest at this time. Recommend EP evaluation as an outpatient. Will let LifeVest rep and case manager know. Will d/w patient.

## 2016-02-15 NOTE — Consult Note (Signed)
Cardiology Consultation Note  Patient ID: Damon [name], MRN: [id number], DOB/AGE: 63/01/1951 63 y.o. Admit date: 02/14/2016   Date of Consult: 02/15/2016 Primary Physician: Emogene Morgan, MD Primary Cardiologist: Dr. Kirke Corin, MD Requesting Physician: Dr. Renae Gloss, MD  Chief Complaint: SOB Reason for Consult: SOB  HPI: 65 y.o. male with h/o recently diagnosed CAD s/p subacute anterior wall MI in the setting of a new LBBB and progressive dyspnea requiring intubation in early October 2017. Also with history of PAD s/p stenting, CKD stage III, DM, HTN, and tobacco abuse who presented to [facility] On 11/7 with a 2 hour episode of SOB in the setting of a warm house.   Prior to his admission in early October he did not have any previously known cardiac history. He does have a h/o claudication and PAD s/p bilateral iliac stenting in 10/2015. He was admitted in early October with increased SOB with the patient trying to get more air by sticking his head in the refrigerator. The handle broke off the refigerator and he fell forward, striking his head. EMS was called, found the patient hypoxic with O2 sats in the 60's. He was placed on BiPAP and transferred to the ED. Upon arrival to the ED in October he was found to be hypertensive with a BP of 210/117, BNP elevated at 2233, initial troponin of 0.32 with a peak of 4.35. EKG showed LBBB of unknown duration. Stat echocardiogram showed and EF of 25-30%, anterior wall motion abnormalities, GR1DD, mild AI. Urgent cardiac cath was done with showed significant three-vessel CAD with the culprit lesion being an occluded mid LAD which was successfully treated with PCI/DES on 10/6. EF was 25-30% with moderatealy elevated LVEDP. His heart failure regimen was optimized, he was diuresed, and underwent staged PCI of the OM1 on 10/9. He was fitted with a LifeVest prior to discharge given his ICM. In hospital follow up he was doing well.   Patient was in his USOH on 11/7 until around 4  PM when he became SOB at rest lasting for 2 hours, then self resolving. He noted his wife had turned up the heat in the house to 79 degrees. As the house began to cool his SOB improved. Never with any chest pain. No LE swelling, abdominal swelling, or orthopnea. Has been tolerating all medications without issues. Has only missed one dose of ASA, otherwise has not missed any doses. Energy level is slowly improving. Has not had any chest pain since he was last seen in the office.   Upon the patient's arrival to [facility] they were found to have minimally elevated and flat trending troponin of 0.05 x 3, improved BNP at 1241, SCr 1.82-->1.60, BUN 27-->31, K+ 3.7-->3.6, WBC 8.4, hgb 14.6, LDL 58. ECG non-acute, CXR showed hyperinflation without acute cardiopulmonary process. He is not wearing his LifeVest upon arrival and has not been wearing this at home stating, each time he puts in a battery it will beep and flash blue and red. He has not felt any discharges. Currently without any SOB or chest pain.    Past Medical History:  Diagnosis Date  . CKD (chronic kidney disease), stage III   . Diabetes mellitus without complication (HCC)   . Diverticulitis   . Essential hypertension   . MI (myocardial infarction)   . PAD (peripheral artery disease) (HCC)    a. 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  . Tobacco abuse  Most Recent Cardiac Studies: Conclusion     Mid RCA lesion, 80 %stenosed.  Ost Cx lesion, 30 %stenosed.  Mid LAD to Dist LAD lesion, 0 %stenosed.  A drug eluting .  Dist LAD lesion, 40 %stenosed.  A STENT XIENCE ALPINE RX [id number] drug eluting stent was successfully placed.  Ost 1st Mrg to 1st Mrg lesion, 95 %stenosed.  Post intervention, there is a 0% residual stenosis.   Successful angioplasty and drug-eluting stent placement to large OM 1 (could be considered mid left circumflex).   Recommendations: Dual antiplatelet therapy for at  least one year. Aggressive treatment for risk factors. Ambulate and possible discharge home tomorrow if remains stable.   Echo 01/13/16: Study Conclusions  - Left ventricle: The cavity size was normal. There was moderate   concentric hypertrophy. Systolic function was severely reduced.   The estimated ejection fraction was in the range of 25% to 30%.   Severe hypokinesis of the mid-apicalanteroseptal, anterior, and   apical myocardium. Doppler parameters are consistent with   abnormal left ventricular relaxation (grade 1 diastolic   dysfunction). - Aortic valve: There was mild regurgitation. - Left atrium: The atrium was mildly dilated.   Surgical History:  Past Surgical History:  Procedure Laterality Date  . CARDIAC CATHETERIZATION N/A 01/13/2016   Procedure: LEFT HEART CATH AND CORONARY ANGIOGRAPHY;  Surgeon: Iran OuchMuhammad A Arida, MD;  Location: [facility] INVASIVE CV LAB;  Service: Cardiovascular;  Laterality: N/A;  . CARDIAC CATHETERIZATION N/A 01/13/2016   Procedure: Coronary Stent Intervention;  Surgeon: Iran OuchMuhammad A Arida, MD;  Location: [facility] INVASIVE CV LAB;  Service: Cardiovascular;  Laterality: N/A;  . CARDIAC CATHETERIZATION N/A 01/16/2016   Procedure: Left Heart Cath and Coronary Angiography;  Surgeon: Iran OuchMuhammad A Arida, MD;  Location: [facility] INVASIVE CV LAB;  Service: Cardiovascular;  Laterality: N/A;  . NECK SURGERY    . PERIPHERAL VASCULAR CATHETERIZATION Left 11/01/2015   Procedure: Lower Extremity Angiography;  Surgeon: Renford DillsGregory G Schnier, MD;  Location: [facility] INVASIVE CV LAB;  Service: Cardiovascular;  Laterality: Left;     Home Meds: Prior to Admission medications   Medication Sig Start Date End Date Taking? Authorizing Provider  aspirin EC 81 MG tablet Take 1 tablet (81 mg total) by mouth daily. 02/07/16  Yes Cataleya Cristina M Genae Strine, PA-C  atorvastatin (LIPITOR) 80 MG tablet Take 1 tablet (80 mg total) by mouth daily at 6 PM. Patient taking differently: Take 80 mg by mouth at bedtime.  01/17/16   Yes Enid Baasadhika [name], MD  carvedilol (COREG) 25 MG tablet Take 1 tablet (25 mg total) by mouth 2 (two) times daily with a meal. 02/01/16  Yes Rivka Baune M Lynita Groseclose, PA-C  furosemide (LASIX) 20 MG tablet Take 1 tablet (20 mg total) by mouth every other day. 02/07/16  Yes Baudelia Schroepfer M Carlinda Ohlson, PA-C  gabapentin (NEURONTIN) 300 MG capsule Take 300 mg by mouth 2 (two) times daily.   Yes Historical Provider, MD  insulin glargine (LANTUS) 100 UNIT/ML injection Inject 30 Units into the skin daily.   Yes Historical Provider, MD  lisinopril (PRINIVIL,ZESTRIL) 20 MG tablet Take 1 tablet (20 mg total) by mouth daily. 01/18/16  Yes Enid Baasadhika [name], MD  spironolactone (ALDACTONE) 25 MG tablet Take 1 tablet (25 mg total) by mouth daily. 01/17/16  Yes Enid Baasadhika [name], MD  ticagrelor (BRILINTA) 90 MG TABS tablet Take 1 tablet (90 mg total) by mouth 2 (two) times daily. 01/17/16  Yes Enid Baasadhika [name], MD    Inpatient Medications:  . aspirin EC  81 mg Oral Daily  .  atorvastatin  80 mg Oral QHS  . bisacodyl  10 mg Oral Once  . carvedilol  25 mg Oral BID WC  . enoxaparin (LOVENOX) injection  40 mg Subcutaneous Q24H  . furosemide  20 mg Oral Daily  . gabapentin  300 mg Oral BID  . insulin aspart  0-5 Units Subcutaneous QHS  . insulin aspart  0-9 Units Subcutaneous TID WC  . insulin glargine  15 Units Subcutaneous Daily  . lisinopril  20 mg Oral Daily  . nicotine  14 mg Transdermal Daily  . potassium chloride  20 mEq Oral Once  . sodium chloride flush  3 mL Intravenous Q12H  . spironolactone  25 mg Oral Daily  . ticagrelor  90 mg Oral BID     Allergies: No Known Allergies  Social History   Social History  . Marital status: Married    Spouse name: N/A  . Number of children: N/A  . Years of education: N/A   Occupational History  . Not on file.   Social History Main Topics  . Smoking status: Current Every Day Smoker    Packs/day: 0.50    Years: 15.00    Types: Cigarettes  . Smokeless tobacco: Current User    . Alcohol use No  . Drug use: No  . Sexual activity: Not on file   Other Topics Concern  . Not on file   Social History Narrative   [place] in [place].  Does not routinely exercise.     Family History  Problem Relation Age of Onset  . Intracerebral hemorrhage Mother   . Diabetes Mother   . Cancer Mother   . Diabetes Father      Review of Systems: Review of Systems  Constitutional: Negative for chills, diaphoresis, fever, malaise/fatigue and weight loss.  HENT: Negative for congestion.   Eyes: Negative for discharge and redness.  Respiratory: Positive for shortness of breath. Negative for cough, hemoptysis, sputum production and wheezing.        SOB x 2 hours, then self resolved  Cardiovascular: Negative for chest pain, palpitations, orthopnea, claudication, leg swelling and PND.  Gastrointestinal: Negative for abdominal pain, blood in stool, heartburn, melena, nausea and vomiting.  Genitourinary: Negative for hematuria.  Musculoskeletal: Negative for falls and myalgias.  Skin: Negative for rash.  Neurological: Negative for dizziness, tingling, tremors, sensory change, speech change, focal weakness, loss of consciousness and weakness.  Endo/Heme/Allergies: Does not bruise/bleed easily.  Psychiatric/Behavioral: Negative for substance abuse. The patient is not nervous/anxious.   All other systems reviewed and are negative.   Labs:  Recent Labs  02/14/16 1651 02/14/16 2014 02/15/16 0011  TROPONINI 0.05* 0.05* 0.05*   Lab Results  Component Value Date   WBC 8.0 02/15/2016   HGB 13.6 02/15/2016   HCT 38.6 (L) 02/15/2016   MCV 92.1 02/15/2016   PLT 282 02/15/2016     Recent Labs Lab 02/15/16 0552  NA 141  K 3.6  CL 107  CO2 26  BUN 31*  CREATININE 1.60*  CALCIUM 8.8*  GLUCOSE 253*   Lab Results  Component Value Date   CHOL 94 02/15/2016   HDL 23 (L) 02/15/2016   LDLCALC 58 02/15/2016   TRIG 64 02/15/2016   No results found for:  DDIMER  Radiology/Studies:  Dg Chest 2 View  Result Date: 02/14/2016 CLINICAL DATA:  65 year old male with shortness breath. Myocardial infarction 4 weeks ago. Hypertension. Diabetes. Smoker. Subsequent encounter. EXAM: CHEST  2 VIEW COMPARISON:  01/15/2016,  03/24/2015 and 04/14/2008. FINDINGS: Hyperinflation of the lungs with central pulmonary vascular prominence stable. No infiltrate, congestive heart failure or pneumothorax. No plain film evidence of pulmonary malignancy. Heart size within normal limits. Calcified aorta. Postsurgical changes cervical spine. Degenerative changes thoracic spine and acromioclavicular joint. IMPRESSION: Hyperinflated lungs without acute pulmonary abnormality. Aortic atherosclerosis. Electronically Signed   By: Lacy Duverney M.D.   On: 02/14/2016 17:25    EKG: Interpreted by me showed: NSR, 79 bpm, left anterior fascicular block, prior septal infarct, poor R wave progression, nonspecific st/t changes Telemetry: Interpreted by me showed: NSR, 70's 10 beats of atrial tachycardia  Weights: Filed Weights   02/14/16 1639 02/14/16 2007  Weight: 159 lb (72.1 kg) 148 lb 14.4 oz (67.5 kg)     Physical Exam: Blood pressure 132/72, pulse 74, temperature 98.6 F (37 C), temperature source Oral, resp. rate 18, height 5\' 10"  (1.778 m), weight 148 lb 14.4 oz (67.5 kg), SpO2 100 %. Body mass index is 21.36 kg/m. General: Well developed, well nourished, in no acute distress. Head: Normocephalic, atraumatic, sclera non-icteric, no xanthomas, nares are without discharge.  Neck: Negative for carotid bruits. JVD not elevated. Lungs: Clear bilaterally to auscultation without wheezes, rales, or rhonchi. Breathing is unlabored. Heart: RRR with S1 S2. No murmurs, rubs, or gallops appreciated. Abdomen: Soft, non-tender, non-distended with normoactive bowel sounds. No hepatomegaly. No rebound/guarding. No obvious abdominal masses. Msk:  Strength and tone appear normal for  age. Extremities: No clubbing or cyanosis. No edema. Distal pedal pulses are 2+ and equal bilaterally. Neuro: Alert and oriented X 3. No facial asymmetry. No focal deficit. Moves all extremities spontaneously. Psych:  Responds to questions appropriately with a normal affect.    Assessment and Plan:  Principal Problem:   SOB (shortness of breath) Active Problems:   CAD (coronary artery disease)   Cardiomyopathy, ischemic   Essential hypertension   CKD (chronic kidney disease), stage II   HLD (hyperlipidemia)    1. SOB: -Lasted 2 hours in the setting of increased heat in his house to 79 degrees. As the house began to cool down his SOB resolved -Cannot rule out mild adverse effect from recently started Brilinta as well -Could take Brilinta with a little Diet Coke -He does not appear to be volume overloaded and troponin is mildly elevated and flat trending c/w demand ischemia in the setting of his cardiomyopathy and renal disease, not consistent with ACS -Recommend ambulation to assess breathing status  2. CAD as above: -Less likely ACS -No plans for inpatient ischemic evaluation -Continue ASA, Brilinta, Coreg, Lipitor, lisinopril -Will start cardiac rehab as outpatient  3. ICM/chronic systolic CHF: -EF 25-30% by echo in early October -Was discharged with LifeVest though has not been wearing this reporting blue and red flashing lights and beeping upon placing the battery. He currently does not have the vest with him at this time in the hospital. His wife is bringing the vest after she gets her hair done this morning. Will contact LifeVest representative to have them evaluate this -He requests echo to assess LV systolic function as he no longer wants to wear the LifeVest. Will schedule echo -Continue Coreg, lisinopril, Lasix, and spironolactone -CHF education  4. HTN: -Well controlled -Continue current medications  5. HLD: -Lipitor -LDL at goal as above  6.  Hypokalemia: -Replete to 4.0  7. CKD stage II-III: -Stable at this time -Limit nephrotoxic agents   Signed, Eula Listen, PA-C [facility] Pager: [phone] 02/15/2016, 8:06 AM

## 2016-02-15 NOTE — Progress Notes (Signed)
D/c tele and PIV.  Patient is alert and oriented, vss, no complaints of pain.   Life vest on.  PAtient has no questions.    To be escorted out of hospital via wheelchair by volunteers.

## 2016-02-15 NOTE — Progress Notes (Signed)
Pt arrived from ED alert and oriented x 4. Skin verified with second Rn, Telemetry box verified with NT . No c/o pain on arrival, no concerns offered. Pt requested something for sleep and nicotine patch due to 0.5 pack a day smoking habit. MD made aware, 14 mg Nicoderm daily and 25 mg PO Benadryl PRN QHS ordered. IV flushed and saline licked, no issues noted.. VS stable .

## 2016-02-21 ENCOUNTER — Encounter: Payer: Commercial Managed Care - PPO | Attending: Cardiovascular Disease | Admitting: *Deleted

## 2016-02-21 DIAGNOSIS — E1122 Type 2 diabetes mellitus with diabetic chronic kidney disease: Secondary | ICD-10-CM | POA: Diagnosis not present

## 2016-02-21 DIAGNOSIS — F17210 Nicotine dependence, cigarettes, uncomplicated: Secondary | ICD-10-CM | POA: Insufficient documentation

## 2016-02-21 DIAGNOSIS — Z955 Presence of coronary angioplasty implant and graft: Secondary | ICD-10-CM | POA: Diagnosis present

## 2016-02-21 DIAGNOSIS — I739 Peripheral vascular disease, unspecified: Secondary | ICD-10-CM | POA: Insufficient documentation

## 2016-02-21 DIAGNOSIS — I214 Non-ST elevation (NSTEMI) myocardial infarction: Secondary | ICD-10-CM | POA: Diagnosis present

## 2016-02-21 DIAGNOSIS — N183 Chronic kidney disease, stage 3 (moderate): Secondary | ICD-10-CM | POA: Diagnosis not present

## 2016-02-21 DIAGNOSIS — I1310 Hypertensive heart and chronic kidney disease without heart failure, with stage 1 through stage 4 chronic kidney disease, or unspecified chronic kidney disease: Secondary | ICD-10-CM | POA: Diagnosis not present

## 2016-02-21 LAB — GLUCOSE, CAPILLARY: GLUCOSE-CAPILLARY: 389 mg/dL — AB (ref 65–99)

## 2016-02-21 NOTE — Progress Notes (Signed)
Incomplete Session Note  Patient Details  Name: Chaney BornJoel L Galdamez MRN: [id number] Date of Birth: 1950-06-11 Referring Provider:   Flowsheet Row Cardiac [facility]06[facility]  Referring Provider  Lorine BearsArida, Muhammad MD      Chaney BornJoel L Fobes did not complete his rehab session.  Pattrick's blood sugar was 334 mg/dL upon check in.  He sat through education class and was rechecked at 389 mg/dL.  Before going home, he had a chance to meet with Olegario MessierKathy for pyschosocial review.  He did not eat breakfast only crackers and coffee and did not take his insulin this morning.  We will try again on Thursday.

## 2016-02-28 ENCOUNTER — Other Ambulatory Visit (INDEPENDENT_AMBULATORY_CARE_PROVIDER_SITE_OTHER): Payer: Self-pay | Admitting: Vascular Surgery

## 2016-02-28 DIAGNOSIS — I700 Atherosclerosis of aorta: Secondary | ICD-10-CM

## 2016-02-28 DIAGNOSIS — I739 Peripheral vascular disease, unspecified: Secondary | ICD-10-CM

## 2016-02-28 DIAGNOSIS — I708 Atherosclerosis of other arteries: Principal | ICD-10-CM

## 2016-03-07 ENCOUNTER — Ambulatory Visit (INDEPENDENT_AMBULATORY_CARE_PROVIDER_SITE_OTHER): Payer: Self-pay | Admitting: Vascular Surgery

## 2016-03-07 ENCOUNTER — Encounter (INDEPENDENT_AMBULATORY_CARE_PROVIDER_SITE_OTHER): Payer: Commercial Managed Care - PPO

## 2016-03-07 ENCOUNTER — Encounter (INDEPENDENT_AMBULATORY_CARE_PROVIDER_SITE_OTHER): Payer: Self-pay

## 2016-03-07 ENCOUNTER — Encounter: Payer: Self-pay | Admitting: *Deleted

## 2016-03-07 ENCOUNTER — Telehealth: Payer: Self-pay | Admitting: *Deleted

## 2016-03-07 DIAGNOSIS — I214 Non-ST elevation (NSTEMI) myocardial infarction: Secondary | ICD-10-CM

## 2016-03-07 DIAGNOSIS — Z955 Presence of coronary angioplasty implant and graft: Secondary | ICD-10-CM

## 2016-03-07 NOTE — Telephone Encounter (Signed)
Called to check on status of return.  Left message at home number.

## 2016-03-08 ENCOUNTER — Telehealth: Payer: Self-pay | Admitting: Cardiovascular Disease

## 2016-03-08 NOTE — Telephone Encounter (Signed)
Received records [employer], [facility].

## 2016-03-13 ENCOUNTER — Encounter: Payer: Commercial Managed Care - PPO | Attending: Cardiovascular Disease

## 2016-03-13 DIAGNOSIS — F17210 Nicotine dependence, cigarettes, uncomplicated: Secondary | ICD-10-CM | POA: Insufficient documentation

## 2016-03-13 DIAGNOSIS — Z955 Presence of coronary angioplasty implant and graft: Secondary | ICD-10-CM | POA: Insufficient documentation

## 2016-03-13 DIAGNOSIS — E1122 Type 2 diabetes mellitus with diabetic chronic kidney disease: Secondary | ICD-10-CM | POA: Insufficient documentation

## 2016-03-13 DIAGNOSIS — I739 Peripheral vascular disease, unspecified: Secondary | ICD-10-CM | POA: Insufficient documentation

## 2016-03-13 DIAGNOSIS — N183 Chronic kidney disease, stage 3 (moderate): Secondary | ICD-10-CM | POA: Insufficient documentation

## 2016-03-13 DIAGNOSIS — I214 Non-ST elevation (NSTEMI) myocardial infarction: Secondary | ICD-10-CM | POA: Insufficient documentation

## 2016-03-13 DIAGNOSIS — I1310 Hypertensive heart and chronic kidney disease without heart failure, with stage 1 through stage 4 chronic kidney disease, or unspecified chronic kidney disease: Secondary | ICD-10-CM | POA: Insufficient documentation

## 2016-03-14 ENCOUNTER — Encounter: Payer: Self-pay | Admitting: *Deleted

## 2016-03-14 DIAGNOSIS — I214 Non-ST elevation (NSTEMI) myocardial infarction: Secondary | ICD-10-CM

## 2016-03-14 DIAGNOSIS — Z955 Presence of coronary angioplasty implant and graft: Secondary | ICD-10-CM

## 2016-03-14 NOTE — Progress Notes (Signed)
Cardiac Individual Treatment Plan  Patient Details  Name: MEGAN HAYDUK MRN: [id number] Date of Birth: Mar 28, 1951 Referring Provider:   Flowsheet Row Cardiac Rehab from 02/06/2016 in [facility] Cardiac and Pulmonary Rehab  Referring Provider  Kathlyn Sacramento MD      Initial Encounter Date:  Flowsheet Row Cardiac Rehab from 02/06/2016 in [facility] Cardiac and Pulmonary Rehab  Date  02/06/16  Referring Provider  Kathlyn Sacramento MD      Visit Diagnosis: NSTEMI (non-ST elevated myocardial infarction) [facility])  Status post coronary artery stent placement  Patient's Home Medications on Admission:  Current Outpatient Prescriptions:  .  aspirin EC 81 MG tablet, Take 1 tablet (81 mg total) by mouth daily., Disp: 150 tablet, Rfl: 2 .  atorvastatin (LIPITOR) 80 MG tablet, Take 1 tablet (80 mg total) by mouth daily at 6 PM. (Patient taking differently: Take 80 mg by mouth at bedtime. ), Disp: 30 tablet, Rfl: 2 .  carvedilol (COREG) 25 MG tablet, Take 1 tablet (25 mg total) by mouth 2 (two) times daily with a meal., Disp: 60 tablet, Rfl: 2 .  furosemide (LASIX) 20 MG tablet, Take 1 tablet (20 mg total) by mouth every other day., Disp: 30 tablet, Rfl: 2 .  gabapentin (NEURONTIN) 300 MG capsule, Take 300 mg by mouth 2 (two) times daily., Disp: , Rfl:  .  insulin glargine (LANTUS) 100 UNIT/ML injection, Inject 30 Units into the skin daily., Disp: , Rfl:  .  lisinopril (PRINIVIL,ZESTRIL) 20 MG tablet, Take 1 tablet (20 mg total) by mouth daily., Disp: 30 tablet, Rfl: 2 .  spironolactone (ALDACTONE) 25 MG tablet, Take 1 tablet (25 mg total) by mouth daily., Disp: 30 tablet, Rfl: 2 .  ticagrelor (BRILINTA) 90 MG TABS tablet, Take 1 tablet (90 mg total) by mouth 2 (two) times daily., Disp: 60 tablet, Rfl: 2  Past Medical History: Past Medical History:  Diagnosis Date  . CKD (chronic kidney disease), stage III   . Diabetes mellitus without complication ([place])   . Diverticulitis   . Essential hypertension   . MI  (myocardial infarction)   . PAD (peripheral artery disease) ([place])    a. 10/2015 s/p PTA and stenting of the RCIA, LCIA, and PTA of the [employer]).  . Tobacco abuse     Tobacco Use: History  Smoking Status  . Current Every Day Smoker  . Packs/day: 0.50  . Years: 15.00  . Types: Cigarettes  Smokeless Tobacco  . Current User    Labs: Recent Review Flowsheet Data    Labs for ITP Cardiac and Pulmonary Rehab Latest Ref Rng & Units 11/16/2014 01/13/2016 01/14/2016 01/16/2016 02/15/2016   Cholestrol 0 - 200 mg/dL - - - - 94   LDLCALC 0 - 99 mg/dL - - - - 58   HDL >40 mg/dL - - - - 23(L)   Trlycerides <150 mg/dL - - - - 64   Hemoglobin A1c 4.8 - 5.6 % - - - 8.3(H) -   PHART 7.350 - 7.450 - 7.16(LL) 7.40 - -   PCO2ART 32.0 - 48.0 mmHg - 57(H) 33 - -   HCO3 20.0 - 28.0 mmol/L 22.0 20.3 20.4 - -   ACIDBASEDEF 0.0 - 2.0 mmol/L 3.1(H) 8.9(H) 3.5(H) - -   O2SAT % - 99.9 98.1 - -       Exercise Target Goals:    Exercise Program Goal: Individual exercise prescription set with THRR, safety & activity barriers. Participant demonstrates ability to understand and report RPE using BORG  scale, to self-measure pulse accurately, and to acknowledge the importance of the exercise prescription.  Exercise Prescription Goal: Starting with aerobic activity 30 plus minutes a day, 3 days per week for initial exercise prescription. Provide home exercise prescription and guidelines that participant acknowledges understanding prior to discharge.  Activity Barriers & Risk Stratification:     Activity Barriers & Cardiac Risk Stratification - 02/06/16 1453      Activity Barriers & Cardiac Risk Stratification   Activity Barriers Shortness of Breath;Balance Concerns;History of Falls;Assistive Device;Deconditioning;Muscular Weakness;Decreased Ventricular Function  wears a life vest, weight of battery pack sometimes makes [place] feel off balance, scar tissue on bottom of feet, gait unsteady with shuffling/toe  catching on swing through of left leg   Cardiac Risk Stratification High      6 Minute Walk:     6 Minute Walk    Row Name 02/06/16 1555         6 Minute Walk   Phase Initial     Distance 260 feet     Walk Time 3.1 minutes     # of Rest Breaks 1  3:31-5:25     MPH 0.98     METS 1.73     RPE 17     Perceived Dyspnea  4     VO2 Peak 6.08     Symptoms Yes (comment)     Comments leg fatigue, SOB, drags left foot causes stumbles in gait     Resting HR 87 bpm     Resting BP 136/66     Max Ex. HR 104 bpm     Max Ex. BP 128/60     2 Minute Post BP 124/70       Interval HR   Baseline HR 87     1 Minute HR 96     2 Minute HR 96     3 Minute HR 101     4 Minute HR 98     5 Minute HR 96     6 Minute HR 104     2 Minute Post HR 85     Interval Heart Rate? Yes       Interval Oxygen   Interval Oxygen? Yes     Baseline Oxygen Saturation % 99 %     Baseline Liters of Oxygen 0 L  Room Air     1 Minute Oxygen Saturation % 100 %     1 Minute Liters of Oxygen 0 L     2 Minute Oxygen Saturation % 99 %     2 Minute Liters of Oxygen 0 L     3 Minute Oxygen Saturation % 97 %     3 Minute Liters of Oxygen 0 L     4 Minute Oxygen Saturation % 98 %     4 Minute Liters of Oxygen 0 L     6 Minute Oxygen Saturation % 99 %     6 Minute Liters of Oxygen 0 L        Initial Exercise Prescription:     Initial Exercise Prescription - 02/06/16 1500      Date of Initial Exercise RX and Referring Provider   Date 02/06/16   Referring Provider Kathlyn Sacramento MD     Recumbant Bike   Level 1   RPM 35   Minutes 15   METs 2     NuStep   Level 1   Watts --  60-80 spm   Minutes 15  METs 2     Biostep-RELP   Level 1   Watts --  40-50 spm   Minutes 15   METs 2     Prescription Details   Frequency (times per week) 2   Duration Progress to 45 minutes of aerobic exercise without signs/symptoms of physical distress     Intensity   THRR 40-80% of Max Heartrate 115-143    Ratings of Perceived Exertion 11-15   Perceived Dyspnea 0-4     Progression   Progression Continue to progress workloads to maintain intensity without signs/symptoms of physical distress.     Resistance Training   Training Prescription Yes   Weight 2 lbs   Reps 10-12      Perform Capillary Blood Glucose checks as needed.  Exercise Prescription Changes:     Exercise Prescription Changes    Row Name 02/06/16 1400             Exercise Review   Progression -  Walk Test Results         Response to Exercise   Blood Pressure (Admit) 136/66       Blood Pressure (Exercise) 128/60       Blood Pressure (Exit) 124/70       Heart Rate (Admit) 87 bpm       Heart Rate (Exercise) 104 bpm       Heart Rate (Exit) 85 bpm       Oxygen Saturation (Admit) 99 %       Oxygen Saturation (Exercise) 99 %       Rating of Perceived Exertion (Exercise) 17       Perceived Dyspnea (Exercise) 4       Symptoms Leg fatigue, SOB, tripping with dragged left foot          Exercise Comments:     Exercise Comments    Row Name 02/06/16 1559           Exercise Comments Kairos wants to be able to walk better without SOB, improve his strength and stamina, and get back to fishing and doing things around the house.          Discharge Exercise Prescription (Final Exercise Prescription Changes):     Exercise Prescription Changes - 02/06/16 1400      Exercise Review   Progression --  Walk Test Results     Response to Exercise   Blood Pressure (Admit) 136/66   Blood Pressure (Exercise) 128/60   Blood Pressure (Exit) 124/70   Heart Rate (Admit) 87 bpm   Heart Rate (Exercise) 104 bpm   Heart Rate (Exit) 85 bpm   Oxygen Saturation (Admit) 99 %   Oxygen Saturation (Exercise) 99 %   Rating of Perceived Exertion (Exercise) 17   Perceived Dyspnea (Exercise) 4   Symptoms Leg fatigue, SOB, tripping with dragged left foot      Nutrition:  Target Goals: Understanding of nutrition guidelines, daily  intake of sodium <1560m, cholesterol <2063m calories 30% from fat and 7% or less from saturated fats, daily to have 5 or more servings of fruits and vegetables.  Biometrics:     Pre Biometrics - 02/06/16 1600      Pre Biometrics   Height 5' 8.6" (1.742 m)   Weight 159 lb 1.6 oz (72.2 kg)   Waist Circumference 35 inches   Hip Circumference 37.5 inches   Waist to Hip Ratio 0.93 %   BMI (Calculated) 23.8   Single Leg Stand 0.89 seconds  Nutrition Therapy Plan and Nutrition Goals:     Nutrition Therapy & Goals - 02/06/16 1447      Intervention Plan   Intervention Prescribe, educate and counsel regarding individualized specific dietary modifications aiming towards targeted core components such as weight, hypertension, lipid management, diabetes, heart failure and other comorbidities.   Expected Outcomes Short Term Goal: Understand basic principles of dietary content, such as calories, fat, sodium, cholesterol and nutrients.;Short Term Goal: A plan has been developed with personal nutrition goals set during dietitian appointment.;Long Term Goal: Adherence to prescribed nutrition plan.      Nutrition Discharge: Rate Your Plate Scores:     Nutrition Assessments - 02/06/16 1448      Rate Your Plate Scores   Pre Score 56   Pre Score % 62 %      Nutrition Goals Re-Evaluation:   Psychosocial: Target Goals: Acknowledge presence or absence of depression, maximize coping skills, provide positive support system. Participant is able to verbalize types and ability to use techniques and skills needed for reducing stress and depression.  Initial Review & Psychosocial Screening:     Initial Psych Review & Screening - 02/06/16 1450      Initial Review   Current issues with Current Depression  States a bit of depression noted.  Starts the day feeling fine and gets a bit depressed later in the day. Hopes that getting started with exercise routine will help him feel better  throughout the day.     Family Dynamics   Good Support System? Yes  Wife     Barriers   Psychosocial barriers to participate in program There are no identifiable barriers or psychosocial needs.;The patient should benefit from training in stress management and relaxation.     Screening Interventions   Interventions Encouraged to exercise      Quality of Life Scores:     Quality of Life - 02/06/16 1452      Quality of Life Scores   Health/Function Pre 18.87 %   Socioeconomic Pre 19.5 %   Psych/Spiritual Pre 20 %   Family Pre 20.25 %   GLOBAL Pre 19.41 %      PHQ-9: Recent Review Flowsheet Data    Depression screen PHQ 2/9 02/06/2016   Decreased Interest 1   Down, Depressed, Hopeless 1   PHQ - 2 Score 2   Altered sleeping 3    Tired, decreased energy 3   Change in appetite 2   Feeling bad or failure about yourself  2   Trouble concentrating 2   Moving slowly or fidgety/restless 3   Suicidal thoughts 0   PHQ-9 Score 17   Difficult doing work/chores Somewhat difficult      Psychosocial Evaluation and Intervention:     Psychosocial Evaluation - 02/21/16 0948      Psychosocial Evaluation & Interventions   Interventions Encouraged to exercise with the program and follow exercise prescription;Stress management education;Relaxation education   Comments Counselor met with Mr Aceituno and his spouse, Barnett Applebaum today for his initial psychosocial evaluation.  He is a 65 year old who had a heart attack and several stents the first week in October.  He and his spouse have been married for 55 years and he has brothers and a sister who live close by.  Mr. Carlean Jews is also a diabetic and has chronic sleep problems due to years of working 3rd shift.  He takes Tylenol PM occasionally which is somewhat helpful.  Mr. Lenn Cal appetite has diminished and he  is losing weight.  He reports a history of depression in 2008; but his spouse indicates he continues to have symptoms.  Counselor assessed Mr. L and  discussed his PHQ-9 scores of "19" indicating current symptoms of depression, with him agreeing he has been struggling for quite some time; but has never discussed with a Dr. or medical provider.  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  Mr. Carlean Jews will benefit from a medication evaluation for his depressive symptoms.  Counselor has spoken with the nurse and she will send this info to his PCP - Dr. Clide Deutscher at [facility].  Counselor informed Mr. L of this and he agreed that would be the best route to take.  He was tearful discussing this matter and states he just keeps emotions inside typically.  His spouse is concerned about him and his mood currently as well.   Counselor and the Cardiac Rehab staff will be following with Mr. Mccambridge throughout the course of this program.     Continued Psychosocial Services Needed Yes  Mr. L needs a medication evaluation for his depressive symptoms that have been ongoing for quite some time according to him and his spouse.  He may also benefit from counseling but preferred to hold off on that currently until he settles into CR.      Psychosocial Re-Evaluation:   Vocational Rehabilitation: Provide vocational rehab assistance to qualifying candidates.   Vocational Rehab Evaluation & Intervention:     Vocational Rehab - 02/06/16 1455      Initial Vocational Rehab Evaluation & Intervention   Assessment shows need for Vocational Rehabilitation No      Education: Education Goals: Education classes will be provided on a weekly basis, covering required topics. Participant will state understanding/return demonstration of topics presented.  Learning Barriers/Preferences:     Learning Barriers/Preferences - 02/06/16 1454      Learning Barriers/Preferences   Learning Barriers Inability to learn new things;Reading   Learning Preferences Group Instruction;Individual  Instruction;Video      Education Topics: General Nutrition Guidelines/Fats and Fiber: -Group instruction provided by verbal, written material, models and posters to present the general guidelines for heart healthy nutrition. Gives an explanation and review of dietary fats and fiber. Flowsheet Row Cardiac Rehab from 02/21/2016 in [facility] Cardiac and Pulmonary Rehab  Date  02/21/16  Educator  SB  Instruction Review Code  2- meets goals/outcomes      Controlling Sodium/Reading Food Labels: -Group verbal and written material supporting the discussion of sodium use in heart healthy nutrition. Review and explanation with models, verbal and written materials for utilization of the food label.   Exercise Physiology & Risk Factors: - Group verbal and written instruction with models to review the exercise physiology of the cardiovascular system and associated critical values. Details cardiovascular disease risk factors and the goals associated with each risk factor.   Aerobic Exercise & Resistance Training: - Gives group verbal and written discussion on the health impact of inactivity. On the components of aerobic and resistive training programs and the benefits of this training and how to safely progress through these programs.   Flexibility, Balance, General Exercise Guidelines: - Provides group verbal and written instruction on the benefits of flexibility and balance training programs. Provides general exercise guidelines with specific guidelines to those with heart or lung disease. Demonstration and skill practice provided.   Stress Management: - Provides group verbal  and written instruction about the health risks of elevated stress, cause of high stress, and healthy ways to reduce stress.   Depression: - Provides group verbal and written instruction on the correlation between heart/lung disease and depressed mood, treatment options, and the stigmas associated with seeking  treatment.   Anatomy & Physiology of the Heart: - Group verbal and written instruction and models provide basic cardiac anatomy and physiology, with the coronary electrical and arterial systems. Review of: AMI, Angina, Valve disease, Heart Failure, Cardiac Arrhythmia, Pacemakers, and the ICD.   Cardiac Procedures: - Group verbal and written instruction and models to describe the testing methods done to diagnose heart disease. Reviews the outcomes of the test results. Describes the treatment choices: Medical Management, Angioplasty, or Coronary Bypass Surgery.   Cardiac Medications: - Group verbal and written instruction to review commonly prescribed medications for heart disease. Reviews the medication, class of the drug, and side effects. Includes the steps to properly store meds and maintain the prescription regimen.   Go Sex-Intimacy & Heart Disease, Get SMART - Goal Setting: - Group verbal and written instruction through game format to discuss heart disease and the return to sexual intimacy. Provides group verbal and written material to discuss and apply goal setting through the application of the S.M.A.R.T. Method.   Other Matters of the Heart: - Provides group verbal, written materials and models to describe Heart Failure, Angina, Valve Disease, and Diabetes in the realm of heart disease. Includes description of the disease process and treatment options available to the cardiac patient.   Exercise & Equipment Safety: - Individual verbal instruction and demonstration of equipment use and safety with use of the equipment. Flowsheet Row Cardiac Rehab from 02/21/2016 in [facility] Cardiac and Pulmonary Rehab  Date  02/06/16  Educator  SB  Instruction Review Code  2- meets goals/outcomes      Infection Prevention: - Provides verbal and written material to individual with discussion of infection control including proper hand washing and proper equipment cleaning during exercise  session. Flowsheet Row Cardiac Rehab from 02/21/2016 in [facility] Cardiac and Pulmonary Rehab  Date  02/06/16  Educator  SB  Instruction Review Code  2- meets goals/outcomes      Falls Prevention: - Provides verbal and written material to individual with discussion of falls prevention and safety. Flowsheet Row Cardiac Rehab from 02/21/2016 in [facility] Cardiac and Pulmonary Rehab  Date  02/06/16  Educator  SB  Instruction Review Code  2- meets goals/outcomes      Diabetes: - Individual verbal and written instruction to review signs/symptoms of diabetes, desired ranges of glucose level fasting, after meals and with exercise. Advice that pre and post exercise glucose checks will be done for 3 sessions at entry of program. Flowsheet Row Cardiac Rehab from 02/21/2016 in [facility] Cardiac and Pulmonary Rehab  Date  02/06/16  Educator  SB  Instruction Review Code  2- meets goals/outcomes       Knowledge Questionnaire Score:     Knowledge Questionnaire Score - 02/06/16 1455      Knowledge Questionnaire Score   Pre Score 20/28      Core Components/Risk Factors/Patient Goals at Admission:     Personal Goals and Risk Factors at Admission - 02/06/16 1448      Core Components/Risk Factors/Patient Goals on Admission    Weight Management Yes;Weight Maintenance   Intervention Weight Management: Develop a combined nutrition and exercise program designed to reach desired caloric intake, while maintaining appropriate intake of nutrient and  fiber, sodium and fats, and appropriate energy expenditure required for the weight goal.;Weight Management: Provide education and appropriate resources to help participant work on and attain dietary goals.   Admit Weight 159 lb 1.6 oz (72.2 kg)   Expected Outcomes Short Term: Continue to assess and modify interventions until short term weight is achieved;Long Term: Adherence to nutrition and physical activity/exercise program aimed toward attainment of established  weight goal;Weight Maintenance: Understanding of the daily nutrition guidelines, which includes 25-35% calories from fat, 7% or less cal from saturated fats, less than 262m cholesterol, less than 1.5gm of sodium, & 5 or more servings of fruits and vegetables daily   Sedentary Yes   Intervention Provide advice, education, support and counseling about physical activity/exercise needs.;Develop an individualized exercise prescription for aerobic and resistive training based on initial evaluation findings, risk stratification, comorbidities and participant's personal goals.   Expected Outcomes Achievement of increased cardiorespiratory fitness and enhanced flexibility, muscular endurance and strength shown through measurements of functional capacity and personal statement of participant.   Increase Strength and Stamina Yes   Intervention Provide advice, education, support and counseling about physical activity/exercise needs.;Develop an individualized exercise prescription for aerobic and resistive training based on initial evaluation findings, risk stratification, comorbidities and participant's personal goals.   Expected Outcomes Achievement of increased cardiorespiratory fitness and enhanced flexibility, muscular endurance and strength shown through measurements of functional capacity and personal statement of participant.   Tobacco Cessation Yes   Intervention Assist the participant in steps to quit. Provide individualized education and counseling about committing to Tobacco Cessation, relapse prevention, and pharmacological support that can be provided by physician.;OAdvice worker assist with locating and accessing local/national Quit Smoking programs, and support quit date choice.   Expected Outcomes Short Term: Will demonstrate readiness to quit, by selecting a quit date.;Short Term: Will quit all tobacco product use, adhering to prevention of relapse plan.;Long Term: Complete abstinence  from all tobacco products for at least 12 months from quit date.  Quit date set to be 02/12/2016. Has patches and nicotine gum available to help with cessation of tobacco.   Diabetes Yes   Intervention Provide education about proper nutrition, including hydration, and aerobic/resistive exercise prescription along with prescribed medications to achieve blood glucose in normal ranges: Fasting glucose 65-99 mg/dL;Provide education about signs/symptoms and action to take for hypo/hyperglycemia.   Expected Outcomes Short Term: Participant verbalizes understanding of the signs/symptoms and immediate care of hyper/hypoglycemia, proper foot care and importance of medication, aerobic/resistive exercise and nutrition plan for blood glucose control.;Long Term: Attainment of HbA1C < 7%.   Hypertension Yes   Intervention Provide education on lifestyle modifcations including regular physical activity/exercise, weight management, moderate sodium restriction and increased consumption of fresh fruit, vegetables, and low fat dairy, alcohol moderation, and smoking cessation.;Monitor prescription use compliance.   Expected Outcomes Short Term: Continued assessment and intervention until BP is < 140/931mHG in hypertensive participants. < 130/8028mG in hypertensive participants with diabetes, heart failure or chronic kidney disease.;Long Term: Maintenance of blood pressure at goal levels.   Lipids Yes   Intervention Provide education and support for participant on nutrition & aerobic/resistive exercise along with prescribed medications to achieve LDL <45m73mDL >40mg5mExpected Outcomes Short Term: Participant states understanding of desired cholesterol values and is compliant with medications prescribed. Participant is following exercise prescription and nutrition guidelines.;Long Term: Cholesterol controlled with medications as prescribed, with individualized exercise RX and with personalized nutrition plan. Value goals:  LDL < 45mg,76m >  40 mg.      Core Components/Risk Factors/Patient Goals Review:    Core Components/Risk Factors/Patient Goals at Discharge (Final Review):    ITP Comments:     ITP Comments    Row Name 02/06/16 1443 02/15/16 0704 02/21/16 1032 03/07/16 1540 03/14/16 0602   ITP Comments Medical review done with Initial ITP completed.  Diagnosis documentation can be found [facility] 01/13/2016 Hospital Encounter 30 day review completed for Medical Director physician review and signature. Continue ITP unless changes made by physician. [name] did not complete his rehab session.  Adrik's blood sugar was 334 mg/dL upon check in.  He sat through education class and was rechecked at 389 mg/dL.  Before going home, he had a chance to meet with Juliann Pulse for pyschosocial review.  He did not eat breakfast only crackers and coffee and did not take his insulin this morning.  We will try again on Thursday. Called to check on status of return.  Left message at home number. 30 day review completed for review by Dr Emily Filbert.  Continue with ITP unless changes noted by Dr Sabra Heck.      Comments:

## 2016-03-20 ENCOUNTER — Encounter: Payer: Self-pay | Admitting: *Deleted

## 2016-03-20 ENCOUNTER — Telehealth: Payer: Self-pay | Admitting: *Deleted

## 2016-03-20 DIAGNOSIS — Z955 Presence of coronary angioplasty implant and graft: Secondary | ICD-10-CM

## 2016-03-20 DIAGNOSIS — I214 Non-ST elevation (NSTEMI) myocardial infarction: Secondary | ICD-10-CM

## 2016-03-20 NOTE — Progress Notes (Signed)
Discharge Summary  Patient Details  Name: Damon [name] MRN: [id number] Date of Birth: 01/31/1951 Referring Provider:   Flowsheet Row Cardiac Rehab from 02/06/2016 [facility]  Referring Provider  Lorine BearsArida, Muhammad MD       Number of Visits: 1  Reason for Discharge:  Early Exit:  Problems with feet  Smoking History:  History  Smoking Status  . Current Every Day Smoker  . Packs/day: 0.50  . Years: 15.00  . Types: Cigarettes  Smokeless Tobacco  . Current User    Diagnosis:  NSTEMI (non-ST elevated myocardial infarction) (HCC)  Status post coronary artery stent placement  ADL [facility]:   Initial Exercise Prescription:     Initial Exercise Prescription - 02/06/16 1500      Date of Initial Exercise RX and Referring Provider   Date 02/06/16   Referring Provider Lorine BearsArida, Muhammad MD     Recumbant Bike   Level 1   RPM 35   Minutes 15   METs 2     NuStep   Level 1   Watts --  60-80 spm   Minutes 15   METs 2     Biostep-RELP   Level 1   Watts --  40-50 spm   Minutes 15   METs 2     Prescription Details   Frequency (times per week) 2   Duration Progress to 45 minutes of aerobic exercise without signs/symptoms of physical distress     Intensity   THRR 40-80% of Max Heartrate 115-143   Ratings of Perceived Exertion 11-15   Perceived Dyspnea 0-4     Progression   Progression Continue to progress workloads to maintain intensity without signs/symptoms of physical distress.     Resistance Training   Training Prescription Yes   Weight 2 lbs   Reps 10-12      Discharge Exercise Prescription (Final Exercise Prescription Changes):     Exercise Prescription Changes - 02/06/16 1400      Exercise Review   Progression --  Walk Test Results     Response to Exercise   Blood Pressure (Admit) 136/66   Blood Pressure (Exercise) 128/60   Blood Pressure (Exit) 124/70   Heart Rate (Admit) 87 bpm   Heart Rate (Exercise) 104 bpm   Heart  Rate (Exit) 85 bpm   Oxygen Saturation (Admit) 99 %   Oxygen Saturation (Exercise) 99 %   Rating of Perceived Exertion (Exercise) 17   Perceived Dyspnea (Exercise) 4   Symptoms Leg fatigue, SOB, tripping with dragged left foot      Functional Capacity:     6 Minute Walk    Row Name 02/06/16 1555         6 Minute Walk   Phase Initial     Distance 260 feet     Walk Time 3.1 minutes     # of Rest Breaks 1  3:31-5:25     MPH 0.98     METS 1.73     RPE 17     Perceived Dyspnea  4     VO2 Peak 6.08     Symptoms Yes (comment)     Comments leg fatigue, SOB, drags left foot causes stumbles in gait     Resting HR 87 bpm     Resting BP 136/66     Max Ex. HR 104 bpm     Max Ex. BP 128/60     2 Minute Post BP 124/70  Interval HR   Baseline HR 87     1 Minute HR 96     2 Minute HR 96     3 Minute HR 101     4 Minute HR 98     5 Minute HR 96     6 Minute HR 104     2 Minute Post HR 85     Interval Heart Rate? Yes       Interval Oxygen   Interval Oxygen? Yes     Baseline Oxygen Saturation % 99 %     Baseline Liters of Oxygen 0 L  Room Air     1 Minute Oxygen Saturation % 100 %     1 Minute Liters of Oxygen 0 L     2 Minute Oxygen Saturation % 99 %     2 Minute Liters of Oxygen 0 L     3 Minute Oxygen Saturation % 97 %     3 Minute Liters of Oxygen 0 L     4 Minute Oxygen Saturation % 98 %     4 Minute Liters of Oxygen 0 L     6 Minute Oxygen Saturation % 99 %     6 Minute Liters of Oxygen 0 L        Psychological, QOL, Others - Outcomes: PHQ 2/9: Depression screen PHQ 2/9 02/06/2016  Decreased Interest 1  Down, Depressed, Hopeless 1  PHQ - 2 Score 2  Altered sleeping 3  Tired, decreased energy 3  Change in appetite 2  Feeling bad or failure about yourself  2  Trouble concentrating 2  Moving slowly or fidgety/restless 3  Suicidal thoughts 0  PHQ-9 Score 17  Difficult doing work/chores Somewhat difficult    Quality of Life:     Quality of Life -  02/06/16 1452      Quality of Life Scores   Health/Function Pre 18.87 %   Socioeconomic Pre 19.5 %   Psych/Spiritual Pre 20 %   Family Pre 20.25 %   GLOBAL Pre 19.41 %      Personal Goals: Goals established at orientation with interventions provided to work toward goal.     Personal Goals and Risk Factors at Admission - 02/06/16 1448      Core Components/Risk Factors/Patient Goals on Admission    Weight Management Yes;Weight Maintenance   Intervention Weight Management: Develop a combined nutrition and exercise program designed to reach desired caloric intake, while maintaining appropriate intake of nutrient and fiber, sodium and fats, and appropriate energy expenditure required for the weight goal.;Weight Management: Provide education and appropriate resources to help participant work on and attain dietary goals.   Admit Weight 159 lb 1.6 oz (72.2 kg)   Expected Outcomes Short Term: Continue to assess and modify interventions until short term weight is achieved;Long Term: Adherence to nutrition and physical activity/exercise program aimed toward attainment of established weight goal;Weight Maintenance: Understanding of the daily nutrition guidelines, which includes 25-35% calories from fat, 7% or less cal from saturated fats, less than 200mg  cholesterol, less than 1.5gm of sodium, & 5 or more servings of fruits and vegetables daily   Sedentary Yes   Intervention Provide advice, education, support and counseling about physical activity/exercise needs.;Develop an individualized exercise prescription for aerobic and resistive training based on initial evaluation findings, risk stratification, comorbidities and participant's personal goals.   Expected Outcomes Achievement of increased cardiorespiratory fitness and enhanced flexibility, muscular endurance and strength shown through measurements of functional capacity  and personal statement of participant.   Increase Strength and Stamina Yes    Intervention Provide advice, education, support and counseling about physical activity/exercise needs.;Develop an individualized exercise prescription for aerobic and resistive training based on initial evaluation findings, risk stratification, comorbidities and participant's personal goals.   Expected Outcomes Achievement of increased cardiorespiratory fitness and enhanced flexibility, muscular endurance and strength shown through measurements of functional capacity and personal statement of participant.   Tobacco Cessation Yes   Intervention Assist the participant in steps to quit. Provide individualized education and counseling about committing to Tobacco Cessation, relapse prevention, and pharmacological support that can be provided by physician.;Education officer, environmentalffer self-teaching materials, assist with locating and accessing local/national Quit Smoking programs, and support quit date choice.   Expected Outcomes Short Term: Will demonstrate readiness to quit, by selecting a quit date.;Short Term: Will quit all tobacco product use, adhering to prevention of relapse plan.;Long Term: Complete abstinence from all tobacco products for at least 12 months from quit date.  Quit date set to be 02/12/2016. Has patches and nicotine gum available to help with cessation of tobacco.   Diabetes Yes   Intervention Provide education about proper nutrition, including hydration, and aerobic/resistive exercise prescription along with prescribed medications to achieve blood glucose in normal ranges: Fasting glucose 65-99 mg/dL;Provide education about signs/symptoms and action to take for hypo/hyperglycemia.   Expected Outcomes Short Term: Participant verbalizes understanding of the signs/symptoms and immediate care of hyper/hypoglycemia, proper foot care and importance of medication, aerobic/resistive exercise and nutrition plan for blood glucose control.;Long Term: Attainment of HbA1C < 7%.   Hypertension Yes   Intervention Provide  education on lifestyle modifcations including regular physical activity/exercise, weight management, moderate sodium restriction and increased consumption of fresh fruit, vegetables, and low fat dairy, alcohol moderation, and smoking cessation.;Monitor prescription use compliance.   Expected Outcomes Short Term: Continued assessment and intervention until BP is < 140/1190mm HG in hypertensive participants. < 130/4380mm HG in hypertensive participants with diabetes, heart failure or chronic kidney disease.;Long Term: Maintenance of blood pressure at goal levels.   Lipids Yes   Intervention Provide education and support for participant on nutrition & aerobic/resistive exercise along with prescribed medications to achieve LDL 70mg , HDL >40mg .   Expected Outcomes Short Term: Participant states understanding of desired cholesterol values and is compliant with medications prescribed. Participant is following exercise prescription and nutrition guidelines.;Long Term: Cholesterol controlled with medications as prescribed, with individualized exercise RX and with personalized nutrition plan. Value goals: LDL < 70mg , HDL > 40 mg.       Personal Goals Discharge:   Nutrition & Weight - Outcomes:     Pre Biometrics - 02/06/16 1600      Pre Biometrics   Height 5' 8.6" (1.742 m)   Weight 159 lb 1.6 oz (72.2 kg)   Waist Circumference 35 inches   Hip Circumference 37.5 inches   Waist to Hip Ratio 0.93 %   BMI (Calculated) 23.8   Single Leg Stand 0.89 seconds       Nutrition:     Nutrition Therapy & Goals - 02/06/16 1447      Intervention Plan   Intervention Prescribe, educate and counsel regarding individualized specific dietary modifications aiming towards targeted core components such as weight, hypertension, lipid management, diabetes, heart failure and other comorbidities.   Expected Outcomes Short Term Goal: Understand basic principles of dietary content, such as calories, fat, sodium,  cholesterol and nutrients.;Short Term Goal: A plan has been developed with personal nutrition goals set  during dietitian appointment.;Long Term Goal: Adherence to prescribed nutrition plan.      Nutrition Discharge:     Nutrition Assessments - 02/06/16 1448      Rate Your Plate Scores   Pre Score 56   Pre Score % 62 %      Education Questionnaire Score:     Knowledge Questionnaire Score - 02/06/16 1455      Knowledge Questionnaire Score   Pre Score 20/28      Goals reviewed with patient; copy given to patient.

## 2016-03-20 NOTE — Progress Notes (Signed)
Cardiac Individual Treatment Plan  Patient Details  Name: Damon [name] MRN: [id number] Date of Birth: Mar 28, 1951 Referring Provider:   Flowsheet Row Cardiac Rehab from 02/06/2016 in [facility] Cardiac and Pulmonary Rehab  Referring Provider  Kathlyn Sacramento MD      Initial Encounter Date:  Flowsheet Row Cardiac Rehab from 02/06/2016 in [facility] Cardiac and Pulmonary Rehab  Date  02/06/16  Referring Provider  Kathlyn Sacramento MD      Visit Diagnosis: NSTEMI (non-ST elevated myocardial infarction) [facility])  Status post coronary artery stent placement  Patient's Home Medications on Admission:  Current Outpatient Prescriptions:  .  aspirin EC 81 MG tablet, Take 1 tablet (81 mg total) by mouth daily., Disp: 150 tablet, Rfl: 2 .  atorvastatin (LIPITOR) 80 MG tablet, Take 1 tablet (80 mg total) by mouth daily at 6 PM. (Patient taking differently: Take 80 mg by mouth at bedtime. ), Disp: 30 tablet, Rfl: 2 .  carvedilol (COREG) 25 MG tablet, Take 1 tablet (25 mg total) by mouth 2 (two) times daily with a meal., Disp: 60 tablet, Rfl: 2 .  furosemide (LASIX) 20 MG tablet, Take 1 tablet (20 mg total) by mouth every other day., Disp: 30 tablet, Rfl: 2 .  gabapentin (NEURONTIN) 300 MG capsule, Take 300 mg by mouth 2 (two) times daily., Disp: , Rfl:  .  insulin glargine (LANTUS) 100 UNIT/ML injection, Inject 30 Units into the skin daily., Disp: , Rfl:  .  lisinopril (PRINIVIL,ZESTRIL) 20 MG tablet, Take 1 tablet (20 mg total) by mouth daily., Disp: 30 tablet, Rfl: 2 .  spironolactone (ALDACTONE) 25 MG tablet, Take 1 tablet (25 mg total) by mouth daily., Disp: 30 tablet, Rfl: 2 .  ticagrelor (BRILINTA) 90 MG TABS tablet, Take 1 tablet (90 mg total) by mouth 2 (two) times daily., Disp: 60 tablet, Rfl: 2  Past Medical History: Past Medical History:  Diagnosis Date  . CKD (chronic kidney disease), stage III   . Diabetes mellitus without complication ([place])   . Diverticulitis   . Essential hypertension   . MI  (myocardial infarction)   . PAD (peripheral artery disease) ([place])    a. 10/2015 s/p PTA and stenting of the RCIA, LCIA, and PTA of the [employer]).  . Tobacco abuse     Tobacco Use: History  Smoking Status  . Current Every Day Smoker  . Packs/day: 0.50  . Years: 15.00  . Types: Cigarettes  Smokeless Tobacco  . Current User    Labs: Recent Review Flowsheet Data    Labs for ITP Cardiac and Pulmonary Rehab Latest Ref Rng & Units 11/16/2014 01/13/2016 01/14/2016 01/16/2016 02/15/2016   Cholestrol 0 - 200 mg/dL - - - - 94   LDLCALC 0 - 99 mg/dL - - - - 58   HDL >40 mg/dL - - - - 23([name])   Trlycerides <150 mg/dL - - - - 64   Hemoglobin A1c 4.8 - 5.6 % - - - 8.3(H) -   PHART 7.350 - 7.450 - 7.16(LL) 7.40 - -   PCO2ART 32.0 - 48.0 mmHg - 57(H) 33 - -   HCO3 20.0 - 28.0 mmol/[name] 22.0 20.3 20.4 - -   ACIDBASEDEF 0.0 - 2.0 mmol/[name] 3.1(H) 8.9(H) 3.5(H) - -   O2SAT % - 99.9 98.1 - -       Exercise Target Goals:    Exercise Program Goal: Individual exercise prescription set with THRR, safety & activity barriers. Participant demonstrates ability to understand and report RPE using BORG  scale, to self-measure [name] accurately, and to acknowledge the importance of the exercise prescription.  Exercise Prescription Goal: Starting with aerobic activity 30 plus minutes a day, 3 days per week for initial exercise prescription. Provide home exercise prescription and guidelines that participant acknowledges understanding prior to discharge.  Activity Barriers & Risk Stratification:     Activity Barriers & Cardiac Risk Stratification - 02/06/16 1453      Activity Barriers & Cardiac Risk Stratification   Activity Barriers Shortness of Breath;Balance Concerns;History of Falls;Assistive Device;Deconditioning;Muscular Weakness;Decreased Ventricular Function  wears a life vest, weight of battery pack sometimes makes Damon [name] feel off balance, scar tissue on bottom of feet, gait unsteady with shuffling/toe  catching on swing through of left leg   Cardiac Risk Stratification High      6 Minute Walk:     6 Minute Walk    Row Name 02/06/16 1555         6 Minute Walk   Phase Initial     Distance 260 feet     Walk Time 3.1 minutes     # of Rest Breaks 1  3:31-5:25     MPH 0.98     METS 1.73     RPE 17     Perceived Dyspnea  4     VO2 Peak 6.08     Symptoms Yes (comment)     Comments leg fatigue, SOB, drags left foot causes stumbles in gait     Resting HR 87 bpm     Resting BP 136/66     Max Ex. HR 104 bpm     Max Ex. BP 128/60     2 Minute Post BP 124/70       Interval HR   Baseline HR 87     1 Minute HR 96     2 Minute HR 96     3 Minute HR 101     4 Minute HR 98     5 Minute HR 96     6 Minute HR 104     2 Minute Post HR 85     Interval Heart Rate? Yes       Interval Oxygen   Interval Oxygen? Yes     Baseline Oxygen Saturation % 99 %     Baseline Liters of Oxygen 0 [name]  Room Air     1 Minute Oxygen Saturation % 100 %     1 Minute Liters of Oxygen 0 [name]     2 Minute Oxygen Saturation % 99 %     2 Minute Liters of Oxygen 0 [name]     3 Minute Oxygen Saturation % 97 %     3 Minute Liters of Oxygen 0 [name]     4 Minute Oxygen Saturation % 98 %     4 Minute Liters of Oxygen 0 [name]     6 Minute Oxygen Saturation % 99 %     6 Minute Liters of Oxygen 0 [name]        Initial Exercise Prescription:     Initial Exercise Prescription - 02/06/16 1500      Date of Initial Exercise RX and Referring Provider   Date 02/06/16   Referring Provider Kathlyn Sacramento MD     Recumbant Bike   Level 1   RPM 35   Minutes 15   METs 2     NuStep   Level 1   Watts --  60-80 spm   Minutes 15  METs 2     Biostep-RELP   Level 1   Watts --  40-50 spm   Minutes 15   METs 2     Prescription Details   Frequency (times per week) 2   Duration Progress to 45 minutes of aerobic exercise without signs/symptoms of physical distress     Intensity   THRR 40-80% of Max Heartrate 115-143    Ratings of Perceived Exertion 11-15   Perceived Dyspnea 0-4     Progression   Progression Continue to progress workloads to maintain intensity without signs/symptoms of physical distress.     Resistance Training   Training Prescription Yes   Weight 2 lbs   Reps 10-12      Perform Capillary Blood Glucose checks as needed.  Exercise Prescription Changes:     Exercise Prescription Changes    Row Name 02/06/16 1400             Exercise Review   Progression -  Walk Test Results         Response to Exercise   Blood Pressure (Admit) 136/66       Blood Pressure (Exercise) 128/60       Blood Pressure (Exit) 124/70       Heart Rate (Admit) 87 bpm       Heart Rate (Exercise) 104 bpm       Heart Rate (Exit) 85 bpm       Oxygen Saturation (Admit) 99 %       Oxygen Saturation (Exercise) 99 %       Rating of Perceived Exertion (Exercise) 17       Perceived Dyspnea (Exercise) 4       Symptoms Leg fatigue, SOB, tripping with dragged left foot          Exercise Comments:     Exercise Comments    Row Name 02/06/16 1559           Exercise Comments Damon [name] wants to be able to walk better without SOB, improve his strength and stamina, and get back to fishing and doing things around the house.          Discharge Exercise Prescription (Final Exercise Prescription Changes):     Exercise Prescription Changes - 02/06/16 1400      Exercise Review   Progression --  Walk Test Results     Response to Exercise   Blood Pressure (Admit) 136/66   Blood Pressure (Exercise) 128/60   Blood Pressure (Exit) 124/70   Heart Rate (Admit) 87 bpm   Heart Rate (Exercise) 104 bpm   Heart Rate (Exit) 85 bpm   Oxygen Saturation (Admit) 99 %   Oxygen Saturation (Exercise) 99 %   Rating of Perceived Exertion (Exercise) 17   Perceived Dyspnea (Exercise) 4   Symptoms Leg fatigue, SOB, tripping with dragged left foot      Nutrition:  Target Goals: Understanding of nutrition guidelines, daily  intake of sodium <1560m, cholesterol <2063m calories 30% from fat and 7% or less from saturated fats, daily to have 5 or more servings of fruits and vegetables.  Biometrics:     Pre Biometrics - 02/06/16 1600      Pre Biometrics   Height 5' 8.6" (1.742 m)   Weight 159 lb 1.6 oz (72.2 kg)   Waist Circumference 35 inches   Hip Circumference 37.5 inches   Waist to Hip Ratio 0.93 %   BMI (Calculated) 23.8   Single Leg Stand 0.89 seconds  Nutrition Therapy Plan and Nutrition Goals:     Nutrition Therapy & Goals - 02/06/16 1447      Intervention Plan   Intervention Prescribe, educate and counsel regarding individualized specific dietary modifications aiming towards targeted core components such as weight, hypertension, lipid management, diabetes, heart failure and other comorbidities.   Expected Outcomes Short Term Goal: Understand basic principles of dietary content, such as calories, fat, sodium, cholesterol and nutrients.;Short Term Goal: A plan has been developed with personal nutrition goals set during dietitian appointment.;Long Term Goal: Adherence to prescribed nutrition plan.      Nutrition Discharge: Rate Your Plate Scores:     Nutrition Assessments - 02/06/16 1448      Rate Your Plate Scores   Pre Score 56   Pre Score % 62 %      Nutrition Goals Re-Evaluation:   Psychosocial: Target Goals: Acknowledge presence or absence of depression, maximize coping skills, provide positive support system. Participant is able to verbalize types and ability to use techniques and skills needed for reducing stress and depression.  Initial Review & Psychosocial Screening:     Initial Psych Review & Screening - 02/06/16 1450      Initial Review   Current issues with Current Depression  States a bit of depression noted.  Starts the day feeling fine and gets a bit depressed later in the day. Hopes that getting started with exercise routine will help him feel better  throughout the day.     Family Dynamics   Good Support System? Yes  Wife     Barriers   Psychosocial barriers to participate in program There are no identifiable barriers or psychosocial needs.;The patient should benefit from training in stress management and relaxation.     Screening Interventions   Interventions Encouraged to exercise      Quality of Life Scores:     Quality of Life - 02/06/16 1452      Quality of Life Scores   Health/Function Pre 18.87 %   Socioeconomic Pre 19.5 %   Psych/Spiritual Pre 20 %   Family Pre 20.25 %   GLOBAL Pre 19.41 %      PHQ-9: Recent Review Flowsheet Data    Depression screen PHQ 2/9 02/06/2016   Decreased Interest 1   Down, Depressed, Hopeless 1   PHQ - 2 Score 2   Altered sleeping 3    Tired, decreased energy 3   Change in appetite 2   Feeling bad or failure about yourself  2   Trouble concentrating 2   Moving slowly or fidgety/restless 3   Suicidal thoughts 0   PHQ-9 Score 17   Difficult doing work/chores Somewhat difficult      Psychosocial Evaluation and Intervention:     Psychosocial Evaluation - 02/21/16 0948      Psychosocial Evaluation & Interventions   Interventions Encouraged to exercise with the program and follow exercise prescription;Stress management education;Relaxation education   Comments Counselor met with Mr Damon [name] and his spouse, Damon [name] today for his initial psychosocial evaluation.  He is a 65 year old who had a heart attack and several stents the first week in October.  He and his spouse have been married for 55 years and he has brothers and a sister who live close by.  Mr. Damon [name] is also a diabetic and has chronic sleep problems due to years of working 3rd shift.  He takes Tylenol PM occasionally which is somewhat helpful.  Mr. Damon [name] appetite has diminished and he  is losing weight.  He reports a history of depression in 2008; but his spouse indicates he continues to have symptoms.  Counselor assessed Damon [name] and  discussed his PHQ-9 scores of "19" indicating current symptoms of depression, with him agreeing he has been struggling for quite some time; but has never discussed with a Damon. or medical provider.  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  Mr. Damon [name] will benefit from a medication evaluation for his depressive symptoms.  Counselor has spoken with the nurse and she will send this info to his PCP - Damon. Clide Deutscher at [facility].  Counselor informed Damon [name] of this and he agreed that would be the best route to take.  He was tearful discussing this matter and states he just keeps emotions inside typically.  His spouse is concerned about him and his mood currently as well.   Counselor and the Cardiac Rehab staff will be following with Damon [name] throughout the course of this program.     Continued Psychosocial Services Needed Yes  Damon [name] needs a medication evaluation for his depressive symptoms that have been ongoing for quite some time according to him and his spouse.  He may also benefit from counseling but preferred to hold off on that currently until he settles into CR.      Psychosocial Re-Evaluation:   Vocational Rehabilitation: Provide vocational rehab assistance to qualifying candidates.   Vocational Rehab Evaluation & Intervention:     Vocational Rehab - 02/06/16 1455      Initial Vocational Rehab Evaluation & Intervention   Assessment shows need for Vocational Rehabilitation No      Education: Education Goals: Education classes will be provided on a weekly basis, covering required topics. Participant will state understanding/return demonstration of topics presented.  Learning Barriers/Preferences:     Learning Barriers/Preferences - 02/06/16 1454      Learning Barriers/Preferences   Learning Barriers Inability to learn new things;Reading   Learning Preferences Group Instruction;Individual  Instruction;Video      Education Topics: General Nutrition Guidelines/Fats and Fiber: -Group instruction provided by verbal, written material, models and posters to present the general guidelines for heart healthy nutrition. Gives an explanation and review of dietary fats and fiber. Flowsheet Row Cardiac Rehab from 02/21/2016 in [facility] Cardiac and Pulmonary Rehab  Date  02/21/16  Educator  SB  Instruction Review Code  2- meets goals/outcomes      Controlling Sodium/Reading Food Labels: -Group verbal and written material supporting the discussion of sodium use in heart healthy nutrition. Review and explanation with models, verbal and written materials for utilization of the food label.   Exercise Physiology & Risk Factors: - Group verbal and written instruction with models to review the exercise physiology of the cardiovascular system and associated critical values. Details cardiovascular disease risk factors and the goals associated with each risk factor.   Aerobic Exercise & Resistance Training: - Gives group verbal and written discussion on the health impact of inactivity. On the components of aerobic and resistive training programs and the benefits of this training and how to safely progress through these programs.   Flexibility, Balance, General Exercise Guidelines: - Provides group verbal and written instruction on the benefits of flexibility and balance training programs. Provides general exercise guidelines with specific guidelines to those with heart or lung disease. Demonstration and skill practice provided.   Stress Management: - Provides group verbal  and written instruction about the health risks of elevated stress, cause of high stress, and healthy ways to reduce stress.   Depression: - Provides group verbal and written instruction on the correlation between heart/lung disease and depressed mood, treatment options, and the stigmas associated with seeking  treatment.   Anatomy & Physiology of the Heart: - Group verbal and written instruction and models provide basic cardiac anatomy and physiology, with the coronary electrical and arterial systems. Review of: AMI, Angina, Valve disease, Heart Failure, Cardiac Arrhythmia, Pacemakers, and the ICD.   Cardiac Procedures: - Group verbal and written instruction and models to describe the testing methods done to diagnose heart disease. Reviews the outcomes of the test results. Describes the treatment choices: Medical Management, Angioplasty, or Coronary Bypass Surgery.   Cardiac Medications: - Group verbal and written instruction to review commonly prescribed medications for heart disease. Reviews the medication, class of the drug, and side effects. Includes the steps to properly store meds and maintain the prescription regimen.   Go Sex-Intimacy & Heart Disease, Get SMART - Goal Setting: - Group verbal and written instruction through game format to discuss heart disease and the return to sexual intimacy. Provides group verbal and written material to discuss and apply goal setting through the application of the S.M.A.R.T. Method.   Other Matters of the Heart: - Provides group verbal, written materials and models to describe Heart Failure, Angina, Valve Disease, and Diabetes in the realm of heart disease. Includes description of the disease process and treatment options available to the cardiac patient.   Exercise & Equipment Safety: - Individual verbal instruction and demonstration of equipment use and safety with use of the equipment. Flowsheet Row Cardiac Rehab from 02/21/2016 in [facility] Cardiac and Pulmonary Rehab  Date  02/06/16  Educator  SB  Instruction Review Code  2- meets goals/outcomes      Infection Prevention: - Provides verbal and written material to individual with discussion of infection control including proper hand washing and proper equipment cleaning during exercise  session. Flowsheet Row Cardiac Rehab from 02/21/2016 in [facility] Cardiac and Pulmonary Rehab  Date  02/06/16  Educator  SB  Instruction Review Code  2- meets goals/outcomes      Falls Prevention: - Provides verbal and written material to individual with discussion of falls prevention and safety. Flowsheet Row Cardiac Rehab from 02/21/2016 in [facility] Cardiac and Pulmonary Rehab  Date  02/06/16  Educator  SB  Instruction Review Code  2- meets goals/outcomes      Diabetes: - Individual verbal and written instruction to review signs/symptoms of diabetes, desired ranges of glucose level fasting, after meals and with exercise. Advice that pre and post exercise glucose checks will be done for 3 sessions at entry of program. Flowsheet Row Cardiac Rehab from 02/21/2016 in [facility] Cardiac and Pulmonary Rehab  Date  02/06/16  Educator  SB  Instruction Review Code  2- meets goals/outcomes       Knowledge Questionnaire Score:     Knowledge Questionnaire Score - 02/06/16 1455      Knowledge Questionnaire Score   Pre Score 20/28      Core Components/Risk Factors/Patient Goals at Admission:     Personal Goals and Risk Factors at Admission - 02/06/16 1448      Core Components/Risk Factors/Patient Goals on Admission    Weight Management Yes;Weight Maintenance   Intervention Weight Management: Develop a combined nutrition and exercise program designed to reach desired caloric intake, while maintaining appropriate intake of nutrient and  fiber, sodium and fats, and appropriate energy expenditure required for the weight goal.;Weight Management: Provide education and appropriate resources to help participant work on and attain dietary goals.   Admit Weight 159 lb 1.6 oz (72.2 kg)   Expected Outcomes Short Term: Continue to assess and modify interventions until short term weight is achieved;Long Term: Adherence to nutrition and physical activity/exercise program aimed toward attainment of established  weight goal;Weight Maintenance: Understanding of the daily nutrition guidelines, which includes 25-35% calories from fat, 7% or less [name] from saturated fats, less than 262m cholesterol, less than 1.5gm of sodium, & 5 or more servings of fruits and vegetables daily   Sedentary Yes   Intervention Provide advice, education, support and counseling about physical activity/exercise needs.;Develop an individualized exercise prescription for aerobic and resistive training based on initial evaluation findings, risk stratification, comorbidities and participant's personal goals.   Expected Outcomes Achievement of increased cardiorespiratory fitness and enhanced flexibility, muscular endurance and strength shown through measurements of functional capacity and personal statement of participant.   Increase Strength and Stamina Yes   Intervention Provide advice, education, support and counseling about physical activity/exercise needs.;Develop an individualized exercise prescription for aerobic and resistive training based on initial evaluation findings, risk stratification, comorbidities and participant's personal goals.   Expected Outcomes Achievement of increased cardiorespiratory fitness and enhanced flexibility, muscular endurance and strength shown through measurements of functional capacity and personal statement of participant.   Tobacco Cessation Yes   Intervention Assist the participant in steps to quit. Provide individualized education and counseling about committing to Tobacco Cessation, relapse prevention, and pharmacological support that can be provided by physician.;OAdvice worker assist with locating and accessing local/national Quit Smoking programs, and support quit date choice.   Expected Outcomes Short Term: Will demonstrate readiness to quit, by selecting a quit date.;Short Term: Will quit all tobacco product use, adhering to prevention of relapse plan.;Long Term: Complete abstinence  from all tobacco products for at least 12 months from quit date.  Quit date set to be 02/12/2016. Has patches and nicotine gum available to help with cessation of tobacco.   Diabetes Yes   Intervention Provide education about proper nutrition, including hydration, and aerobic/resistive exercise prescription along with prescribed medications to achieve blood glucose in normal ranges: Fasting glucose 65-99 mg/dL;Provide education about signs/symptoms and action to take for hypo/hyperglycemia.   Expected Outcomes Short Term: Participant verbalizes understanding of the signs/symptoms and immediate care of hyper/hypoglycemia, proper foot care and importance of medication, aerobic/resistive exercise and nutrition plan for blood glucose control.;Long Term: Attainment of HbA1C < 7%.   Hypertension Yes   Intervention Provide education on lifestyle modifcations including regular physical activity/exercise, weight management, moderate sodium restriction and increased consumption of fresh fruit, vegetables, and low fat dairy, alcohol moderation, and smoking cessation.;Monitor prescription use compliance.   Expected Outcomes Short Term: Continued assessment and intervention until BP is < 140/931mHG in hypertensive participants. < 130/8028mG in hypertensive participants with diabetes, heart failure or chronic kidney disease.;Long Term: Maintenance of blood pressure at goal levels.   Lipids Yes   Intervention Provide education and support for participant on nutrition & aerobic/resistive exercise along with prescribed medications to achieve LDL <45m73mDL >40mg5mExpected Outcomes Short Term: Participant states understanding of desired cholesterol values and is compliant with medications prescribed. Participant is following exercise prescription and nutrition guidelines.;Long Term: Cholesterol controlled with medications as prescribed, with individualized exercise RX and with personalized nutrition plan. Value goals:  LDL < 45mg,76m >  40 mg.      Core Components/Risk Factors/Patient Goals Review:    Core Components/Risk Factors/Patient Goals at Discharge (Final Review):    ITP Comments:     ITP Comments    Row Name 02/06/16 1443 02/15/16 0704 02/21/16 1032 03/07/16 1540 03/14/16 0602   ITP Comments Medical review done with Initial ITP completed.  Diagnosis documentation can be found [facility] 01/13/2016 Hospital Encounter 30 day review completed for Medical Director physician review and signature. Continue ITP unless changes made by physician. Damon [name] did not complete his rehab session.  Damon [name]'s blood sugar was 334 mg/dL upon check in.  He sat through education class and was rechecked at 389 mg/dL.  Before going home, he had a chance to meet with Damon [name] for pyschosocial review.  He did not eat breakfast only crackers and coffee and did not take his insulin this morning.  We will try again on Thursday. Called to check on status of return.  Left message at home number. 30 day review completed for review by Damon [name]  Continue with ITP unless changes noted by Damon [name]   [place] Name 03/20/16 1617           ITP Comments Called to check on status of return.  He stated that he has been having problems with his feet and can barely stand.  Khrystian would like to be discharged from the program at this time.  He hopes to return in the future once his feet are better.          Comments: Discharge ITP

## 2016-03-20 NOTE — Telephone Encounter (Signed)
Called to check on status of return.  He stated that he has been having problems with his feet and can barely stand.  Damon [name] would like to be discharged from the program at this time.  He hopes to return in the future once his feet are better.

## 2016-03-26 ENCOUNTER — Telehealth: Payer: Self-pay | Admitting: Cardiovascular Disease

## 2016-03-26 NOTE — Telephone Encounter (Signed)
Forms received from [employer] for md completion .  Placed in nurse inbox.

## 2016-03-26 NOTE — Telephone Encounter (Signed)
[employer] paperwork placed in MD basket for signature

## 2016-03-27 ENCOUNTER — Telehealth: Payer: Self-pay | Admitting: Cardiovascular Disease

## 2016-03-27 ENCOUNTER — Inpatient Hospital Stay
Admission: EM | Admit: 2016-03-27 | Discharge: 2016-03-29 | DRG: 682 | Disposition: A | Discharge status: Home / Self Care | Payer: Commercial Managed Care - PPO | Attending: Internal Medicine | Admitting: Internal Medicine

## 2016-03-27 ENCOUNTER — Other Ambulatory Visit: Payer: Self-pay

## 2016-03-27 ENCOUNTER — Encounter: Payer: Self-pay | Admitting: Emergency Medicine

## 2016-03-27 ENCOUNTER — Emergency Department: Payer: Commercial Managed Care - PPO

## 2016-03-27 DIAGNOSIS — E43 Unspecified severe protein-calorie malnutrition: Secondary | ICD-10-CM | POA: Diagnosis present

## 2016-03-27 DIAGNOSIS — E785 Hyperlipidemia, unspecified: Secondary | ICD-10-CM | POA: Diagnosis present

## 2016-03-27 DIAGNOSIS — I255 Ischemic cardiomyopathy: Secondary | ICD-10-CM | POA: Diagnosis present

## 2016-03-27 DIAGNOSIS — F17210 Nicotine dependence, cigarettes, uncomplicated: Secondary | ICD-10-CM | POA: Diagnosis present

## 2016-03-27 DIAGNOSIS — I5022 Chronic systolic (congestive) heart failure: Secondary | ICD-10-CM | POA: Diagnosis present

## 2016-03-27 DIAGNOSIS — I739 Peripheral vascular disease, unspecified: Secondary | ICD-10-CM | POA: Diagnosis present

## 2016-03-27 DIAGNOSIS — E1151 Type 2 diabetes mellitus with diabetic peripheral angiopathy without gangrene: Secondary | ICD-10-CM | POA: Diagnosis present

## 2016-03-27 DIAGNOSIS — R4182 Altered mental status, unspecified: Secondary | ICD-10-CM

## 2016-03-27 DIAGNOSIS — R570 Cardiogenic shock: Secondary | ICD-10-CM | POA: Diagnosis present

## 2016-03-27 DIAGNOSIS — R571 Hypovolemic shock: Secondary | ICD-10-CM | POA: Diagnosis present

## 2016-03-27 DIAGNOSIS — I252 Old myocardial infarction: Secondary | ICD-10-CM

## 2016-03-27 DIAGNOSIS — I130 Hypertensive heart and chronic kidney disease with heart failure and stage 1 through stage 4 chronic kidney disease, or unspecified chronic kidney disease: Secondary | ICD-10-CM | POA: Diagnosis present

## 2016-03-27 DIAGNOSIS — S91301A Unspecified open wound, right foot, initial encounter: Secondary | ICD-10-CM | POA: Diagnosis present

## 2016-03-27 DIAGNOSIS — E1165 Type 2 diabetes mellitus with hyperglycemia: Secondary | ICD-10-CM | POA: Diagnosis present

## 2016-03-27 DIAGNOSIS — Z7982 Long term (current) use of aspirin: Secondary | ICD-10-CM

## 2016-03-27 DIAGNOSIS — E1122 Type 2 diabetes mellitus with diabetic chronic kidney disease: Secondary | ICD-10-CM | POA: Diagnosis present

## 2016-03-27 DIAGNOSIS — Z955 Presence of coronary angioplasty implant and graft: Secondary | ICD-10-CM

## 2016-03-27 DIAGNOSIS — G9341 Metabolic encephalopathy: Secondary | ICD-10-CM | POA: Diagnosis present

## 2016-03-27 DIAGNOSIS — I2510 Atherosclerotic heart disease of native coronary artery without angina pectoris: Secondary | ICD-10-CM | POA: Diagnosis present

## 2016-03-27 DIAGNOSIS — I959 Hypotension, unspecified: Secondary | ICD-10-CM | POA: Diagnosis present

## 2016-03-27 DIAGNOSIS — Z794 Long term (current) use of insulin: Secondary | ICD-10-CM | POA: Diagnosis not present

## 2016-03-27 DIAGNOSIS — M6281 Muscle weakness (generalized): Secondary | ICD-10-CM

## 2016-03-27 DIAGNOSIS — N179 Acute kidney failure, unspecified: Principal | ICD-10-CM | POA: Diagnosis present

## 2016-03-27 DIAGNOSIS — E871 Hypo-osmolality and hyponatremia: Secondary | ICD-10-CM | POA: Diagnosis present

## 2016-03-27 DIAGNOSIS — R262 Difficulty in walking, not elsewhere classified: Secondary | ICD-10-CM

## 2016-03-27 DIAGNOSIS — Z79899 Other long term (current) drug therapy: Secondary | ICD-10-CM | POA: Diagnosis not present

## 2016-03-27 DIAGNOSIS — N171 Acute kidney failure with acute cortical necrosis: Secondary | ICD-10-CM | POA: Diagnosis not present

## 2016-03-27 DIAGNOSIS — N183 Chronic kidney disease, stage 3 (moderate): Secondary | ICD-10-CM | POA: Diagnosis present

## 2016-03-27 LAB — COMPREHENSIVE METABOLIC PANEL
ALK PHOS: 134 U/L — AB (ref 38–126)
ALT: 25 U/L (ref 17–63)
ANION GAP: 10 (ref 5–15)
AST: 21 U/L (ref 15–41)
Albumin: 3.7 g/dL (ref 3.5–5.0)
BILIRUBIN TOTAL: 0.1 mg/dL — AB (ref 0.3–1.2)
BUN: 59 mg/dL — ABNORMAL HIGH (ref 6–20)
CALCIUM: 8.8 mg/dL — AB (ref 8.9–10.3)
CO2: 19 mmol/L — ABNORMAL LOW (ref 22–32)
Chloride: 97 mmol/L — ABNORMAL LOW (ref 101–111)
Creatinine, Ser: 3.12 mg/dL — ABNORMAL HIGH (ref 0.61–1.24)
GFR, EST AFRICAN AMERICAN: 23 mL/min — AB (ref >60)
GFR, EST NON AFRICAN AMERICAN: 20 mL/min — AB (ref >60)
GLUCOSE: 523 mg/dL — AB (ref 65–99)
POTASSIUM: 5.1 mmol/L (ref 3.5–5.1)
Sodium: 126 mmol/L — ABNORMAL LOW (ref 135–145)
TOTAL PROTEIN: 7.3 g/dL (ref 6.5–8.1)

## 2016-03-27 LAB — CBC
HEMATOCRIT: 40.4 % (ref 40.0–52.0)
HEMATOCRIT: 41.1 % (ref 40.0–52.0)
HEMOGLOBIN: 13.7 g/dL (ref 13.0–18.0)
Hemoglobin: 13.8 g/dL (ref 13.0–18.0)
MCH: 31.2 pg (ref 26.0–34.0)
MCH: 31.3 pg (ref 26.0–34.0)
MCHC: 33.8 g/dL (ref 32.0–36.0)
MCHC: 33.5 g/dL (ref 32.0–36.0)
MCV: 92.2 fL (ref 80.0–100.0)
MCV: 93.5 fL (ref 80.0–100.0)
Platelets: 266 10*3/uL (ref 150–440)
Platelets: 250 10*3/uL (ref 150–440)
RBC: 4.38 MIL/uL — ABNORMAL LOW (ref 4.40–5.90)
RBC: 4.4 MIL/uL (ref 4.40–5.90)
RDW: 13.1 % (ref 11.5–14.5)
RDW: 13.3 % (ref 11.5–14.5)
WBC: 8.5 10*3/uL (ref 3.8–10.6)
WBC: 9.9 10*3/uL (ref 3.8–10.6)

## 2016-03-27 LAB — GLUCOSE, CAPILLARY
GLUCOSE-CAPILLARY: 465 mg/dL — AB (ref 65–99)
GLUCOSE-CAPILLARY: 436 mg/dL — AB (ref 65–99)
GLUCOSE-CAPILLARY: 76 mg/dL (ref 65–99)
Glucose-Capillary: 436 mg/dL — ABNORMAL HIGH (ref 65–99)
Glucose-Capillary: 360 mg/dL — ABNORMAL HIGH (ref 65–99)
Glucose-Capillary: 202 mg/dL — ABNORMAL HIGH (ref 65–99)
Glucose-Capillary: 305 mg/dL — ABNORMAL HIGH (ref 65–99)
Glucose-Capillary: 120 mg/dL — ABNORMAL HIGH (ref 65–99)

## 2016-03-27 LAB — CREATININE, SERUM
CREATININE: 2.8 mg/dL — AB (ref 0.61–1.24)
GFR, EST AFRICAN AMERICAN: 26 mL/min — AB (ref >60)
GFR, EST NON AFRICAN AMERICAN: 22 mL/min — AB (ref >60)

## 2016-03-27 LAB — LIPASE, BLOOD: LIPASE: 33 U/L (ref 11–51)

## 2016-03-27 LAB — MRSA PCR SCREENING: MRSA by PCR: NEGATIVE

## 2016-03-27 LAB — LACTIC ACID, PLASMA
LACTIC ACID, VENOUS: 1.5 mmol/L (ref 0.5–1.9)
LACTIC ACID, VENOUS: 2.6 mmol/L — AB (ref 0.5–1.9)

## 2016-03-27 LAB — BRAIN NATRIURETIC PEPTIDE: B Natriuretic Peptide: 503 pg/mL — ABNORMAL HIGH (ref 0.0–100.0)

## 2016-03-27 LAB — TROPONIN I: TROPONIN I: 0.08 ng/mL — AB (ref <0.03)

## 2016-03-27 MED ORDER — SODIUM CHLORIDE 0.9 % IV SOLN
INTRAVENOUS | Status: DC
  Administered 2016-03-27: 3.8 [IU]/h via INTRAVENOUS
  Administered 2016-03-27: 8.7 [IU]/h via INTRAVENOUS
  Filled 2016-03-27: qty 2.5

## 2016-03-27 MED ORDER — ARIPIPRAZOLE 15 MG PO TABS
15.0000 mg | ORAL_TABLET | Freq: Every day | ORAL | Status: DC
  Administered 2016-03-28 – 2016-03-29 (×2): 15 mg via ORAL
  Filled 2016-03-27 (×2): qty 1

## 2016-03-27 MED ORDER — DEXTROSE 50 % IV SOLN: 25.0000 mL | INTRAVENOUS | Status: DC | PRN

## 2016-03-27 MED ORDER — HEPARIN SODIUM (PORCINE) 5000 UNIT/ML IJ SOLN
5000.0000 [IU] | Freq: Three times a day (TID) | INTRAMUSCULAR | Status: DC
  Administered 2016-03-27 – 2016-03-29 (×5): 5000 [IU] via SUBCUTANEOUS
  Filled 2016-03-27 (×5): qty 1

## 2016-03-27 MED ORDER — SODIUM CHLORIDE 0.9 % IV BOLUS (SEPSIS)
500.0000 mL | Freq: Once | INTRAVENOUS | Status: AC
  Administered 2016-03-27: 500 mL via INTRAVENOUS

## 2016-03-27 MED ORDER — INSULIN ASPART 100 UNIT/ML ~~LOC~~ SOLN
8.0000 [IU] | Freq: Once | SUBCUTANEOUS | Status: AC
  Administered 2016-03-27: 8 [IU] via SUBCUTANEOUS
  Filled 2016-03-27: qty 8

## 2016-03-27 MED ORDER — TRAMADOL HCL 50 MG PO TABS: 50.0000 mg | ORAL_TABLET | Freq: Three times a day (TID) | ORAL | Status: DC | PRN

## 2016-03-27 MED ORDER — ASPIRIN EC 81 MG PO TBEC
81.0000 mg | DELAYED_RELEASE_TABLET | Freq: Every day | ORAL | Status: DC
  Administered 2016-03-28 – 2016-03-29 (×2): 81 mg via ORAL
  Filled 2016-03-27 (×2): qty 1

## 2016-03-27 MED ORDER — ACETAMINOPHEN 650 MG RE SUPP
650.0000 mg | Freq: Four times a day (QID) | RECTAL | Status: DC | PRN
Start: 2016-03-27 — End: 2016-03-29

## 2016-03-27 MED ORDER — SODIUM CHLORIDE 0.9 % IV BOLUS (SEPSIS)
1000.0000 mL | Freq: Once | INTRAVENOUS | Status: AC
  Administered 2016-03-27: 1000 mL via INTRAVENOUS

## 2016-03-27 MED ORDER — TICAGRELOR 90 MG PO TABS
90.0000 mg | ORAL_TABLET | Freq: Two times a day (BID) | ORAL | Status: DC
  Administered 2016-03-27 – 2016-03-29 (×4): 90 mg via ORAL
  Filled 2016-03-27 (×4): qty 1

## 2016-03-27 MED ORDER — NOREPINEPHRINE BITARTRATE 1 MG/ML IV SOLN: 2.0000 ug/min | INTRAVENOUS | Status: DC

## 2016-03-27 MED ORDER — ACETAMINOPHEN 325 MG PO TABS
650.0000 mg | ORAL_TABLET | Freq: Four times a day (QID) | ORAL | Status: DC | PRN
Start: 2016-03-27 — End: 2016-03-29

## 2016-03-27 MED ORDER — INSULIN REGULAR BOLUS VIA INFUSION: 0.0000 [IU] | Freq: Three times a day (TID) | INTRAVENOUS | Status: DC

## 2016-03-27 MED ORDER — ONDANSETRON HCL 4 MG PO TABS: 4.0000 mg | ORAL_TABLET | Freq: Four times a day (QID) | ORAL | Status: DC | PRN

## 2016-03-27 MED ORDER — NOREPINEPHRINE 4 MG/250ML-% IV SOLN
0.0000 ug/min | INTRAVENOUS | Status: DC
  Administered 2016-03-27: 2 ug/min via INTRAVENOUS
  Filled 2016-03-27: qty 250

## 2016-03-27 MED ORDER — CARVEDILOL 25 MG PO TABS: 25.0000 mg | ORAL_TABLET | Freq: Two times a day (BID) | ORAL | Status: DC

## 2016-03-27 MED ORDER — SODIUM CHLORIDE 0.9 % IV SOLN
INTRAVENOUS | Status: DC
  Administered 2016-03-27: 20:00:00 via INTRAVENOUS

## 2016-03-27 MED ORDER — DEXTROSE-NACL 5-0.45 % IV SOLN
INTRAVENOUS | Status: DC
  Administered 2016-03-27: 22:00:00 via INTRAVENOUS

## 2016-03-27 MED ORDER — SODIUM CHLORIDE 0.9 % IV SOLN
INTRAVENOUS | Status: DC
  Administered 2016-03-27: 19:00:00 via INTRAVENOUS

## 2016-03-27 MED ORDER — ONDANSETRON HCL 4 MG/2ML IJ SOLN: 4.0000 mg | Freq: Four times a day (QID) | INTRAMUSCULAR | Status: DC | PRN

## 2016-03-27 MED ORDER — ATORVASTATIN CALCIUM 20 MG PO TABS
80.0000 mg | ORAL_TABLET | Freq: Every day | ORAL | Status: DC
  Administered 2016-03-28: 80 mg via ORAL
  Filled 2016-03-27: qty 4

## 2016-03-27 NOTE — Telephone Encounter (Signed)
[employer] FMLA/Disability form paperwork given to [place]

## 2016-03-27 NOTE — ED Triage Notes (Signed)
Pt to ed with c/o weakness and sob since yesterday.  Pt family reports he had a heart attack in November.
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
## 2016-03-27 NOTE — Telephone Encounter (Signed)
Sent inter office to [employer] completed forms for [employer]

## 2016-03-27 NOTE — ED Notes (Signed)
Patient reports yesterday having an episode where he was trying to get some sputum up and was nauseatted and sick on stomach. Once he got it up he felt better. Clear thick large amount. Reports dizziness at times especially with standing.

## 2016-03-27 NOTE — Consult Note (Signed)
PULMONARY / CRITICAL CARE MEDICINE   Name: Damon [name] MRN: [id number] DOB: 24-Dec-1950    ADMISSION DATE:  03/27/2016 CONSULTATION DATE:  03/27/16  REFERRING MD:  Dr. Oralia Manis  CHIEF COMPLAINT: Hypotension  HISTORY OF PRESENT ILLNESS:    Damon [name] is a 65 yo male with known Hx of CKD Stage- III, DM, Diverticulitis, Essential Hypertension, Ischemic Cardiomyopathy with EF of 30-35%, Peripheral artery disease and Tobacco Abuse.  Patient presented to ED  On 12/19 with hypotension ,acute on  Chronic renal failure and hyperglycemia with a blood glucose of 523mg /dl.  Patient was sent to the ICU for close observation Patient was receiving gentle  fluid resuscitation for hypotension.  Patient continues to be hypotensive therefore PCCM TEAM was consulted for Hypotension Management.   PAST MEDICAL HISTORY :  He  has a past medical history of CKD (chronic kidney disease), stage III; Diabetes mellitus without complication (HCC); Diverticulitis; Essential hypertension; MI (myocardial infarction); PAD (peripheral artery disease) (HCC); and Tobacco abuse.  PAST SURGICAL HISTORY: He  has a past surgical history that includes Neck surgery; Cardiac catheterization (Left, 11/01/2015); Cardiac catheterization (N/A, 01/13/2016); Cardiac catheterization (N/A, 01/13/2016); and Cardiac catheterization (N/A, 01/16/2016).  No Known Allergies  No current facility-administered medications on file prior to encounter.    Current Outpatient Prescriptions on File Prior to Encounter  Medication Sig  . aspirin EC 81 MG tablet Take 1 tablet (81 mg total) by mouth daily.  Marland Kitchen atorvastatin (LIPITOR) 80 MG tablet Take 1 tablet (80 mg total) by mouth daily at 6 PM.  . carvedilol (COREG) 25 MG tablet Take 1 tablet (25 mg total) by mouth 2 (two) times daily with a meal.  . furosemide (LASIX) 20 MG tablet Take 1 tablet (20 mg total) by mouth every other day.  . insulin glargine (LANTUS) 100 UNIT/ML injection Inject 30 Units  into the skin every evening.   Marland Kitchen lisinopril (PRINIVIL,ZESTRIL) 20 MG tablet Take 1 tablet (20 mg total) by mouth daily.  Marland Kitchen spironolactone (ALDACTONE) 25 MG tablet Take 1 tablet (25 mg total) by mouth daily.  . ticagrelor (BRILINTA) 90 MG TABS tablet Take 1 tablet (90 mg total) by mouth 2 (two) times daily.  Marland Kitchen gabapentin (NEURONTIN) 300 MG capsule Take 300 mg by mouth 2 (two) times daily.    FAMILY HISTORY:  His indicated that his mother is deceased. He indicated that his father is deceased.    SOCIAL HISTORY: He  reports that he has been smoking Cigarettes.  He has a 7.50 pack-year smoking history. He uses smokeless tobacco. He reports that he does not drink alcohol or use drugs.  REVIEW OF SYSTEMS:   Review of Systems  Constitutional: Positive for malaise/fatigue. Negative for diaphoresis.  HENT: Negative for sinus pain and tinnitus.   Eyes: Negative for double vision, photophobia and pain.  Respiratory: Negative for hemoptysis, sputum production and shortness of breath.   Cardiovascular: Negative for palpitations, orthopnea and claudication.  Gastrointestinal: Negative for abdominal pain, constipation and vomiting.  Genitourinary: Negative for flank pain, frequency and hematuria.  Musculoskeletal: Negative for back pain, falls, joint pain and neck pain.  Skin: Negative for itching and rash.  Neurological: Negative for tingling, tremors, sensory change, speech change and weakness.  Endo/Heme/Allergies: Negative for environmental allergies. Does not bruise/bleed easily.  Psychiatric/Behavioral: Negative for hallucinations and substance abuse. The patient is not nervous/anxious.      SUBJECTIVE:  Patient states that he feels okay   VITAL SIGNS: BP (!) 73/46   Pulse  74   Temp 98.6 F (37 C) (Oral)   Resp 20   Ht 5\' 9"  (1.753 m)   Wt 65.2 kg (143 lb 11.8 oz)   SpO2 92%   BMI 21.23 kg/m   HEMODYNAMICS:    VENTILATOR SETTINGS:    INTAKE / OUTPUT: I/O last 3 completed  shifts: In: 500 [IV Piggyback:500] Out: -   PHYSICAL EXAMINATION: General: AA male, on RA in no acute distress Neuro:  Awake, Alert and oriented , no focal deficits HEENT:  Atraumatic, normocephalic, no JVD apreciated Cardiovascular: S1S2,SR, no mrg noted Lungs:  Diminished bibasilar, no wheezes, crackles and rhonchi noted Abdomen:  Soft, nontender, active bowel sounds Musculoskeletal:  No inflammation/deformity noted  Skin: grossly intact  LABS:  BMET  Recent Labs Lab 03/27/16 1519 03/27/16 2006  NA 126*  --   K 5.1  --   CL 97*  --   CO2 19*  --   BUN 59*  --   CREATININE 3.12* 2.80*  GLUCOSE 523*  --     Electrolytes  Recent Labs Lab 03/27/16 1519  CALCIUM 8.8*    CBC  Recent Labs Lab 03/27/16 1519 03/27/16 2006  WBC 8.5 9.9  HGB 13.7 13.8  HCT 40.4 41.1  PLT 266 250    Coag's No results for input(s): APTT, INR in the last 168 hours.  Sepsis Markers  Recent Labs Lab 03/27/16 1646 03/27/16 2006  LATICACIDVEN 1.5 2.6*    ABG No results for input(s): PHART, PCO2ART, PO2ART in the last 168 hours.  Liver Enzymes  Recent Labs Lab 03/27/16 1519  AST 21  ALT 25  ALKPHOS 134*  BILITOT 0.1*  ALBUMIN 3.7    Cardiac Enzymes  Recent Labs Lab 03/27/16 1519  TROPONINI 0.08*    Glucose  Recent Labs Lab 03/27/16 1843 03/27/16 1942 03/27/16 2046 03/27/16 2106 03/27/16 2204 03/27/16 2309  GLUCAP 436* 360* 305* 202* 120* 76    Imaging Dg Chest Portable 1 View  Result Date: 03/27/2016 CLINICAL DATA:  Weakness and shortness of breath since yesterday. MI in November 2017. The patient has undergone coronary stent placement. Current smoker. EXAM: PORTABLE CHEST 1 VIEW COMPARISON:  Chest x-ray of February 14, 2016 FINDINGS: The lungs remain hyperinflated. There is no focal infiltrate. There is no pneumothorax, pneumomediastinum, or pleural effusion. The heart and pulmonary vascularity are normal. There is calcification in the wall of the  aortic arch. The observed bony thorax is normal. IMPRESSION: Hyperinflation consistent with COPD or reactive airway disease. There is no pneumonia, CHF, nor other acute cardiopulmonary abnormality. Thoracic aortic atherosclerosis. Electronically Signed   By: David  [place] M.D.   On: 03/27/2016 15:44     STUDIES:  02/25/16 ECHO>>Systolic function was moderately to severely reduced.  The estimated ejection fraction was in the range of 30% to 35%  01/16/16 Cardiac Cath>>Mid RCA lesion, 80 %stenosed.  Ost Cx lesion, 30 %stenosed.  Mid LAD to Dist LAD lesion, 0 %stenosed.  A drug eluting .  Dist LAD lesion, 40 %stenosed.  A STENT XIENCE ALPINE RX [id number] drug eluting stent was successfully placed.  Ost 1st Mrg to 1st Mrg lesion, 95 %stenosed. Post intervention, there is a 0% residual stenosis CT head 01/13/2016>>No acute intracranial abnormality.  CULTURES: none  ANTIBIOTICS: none  SIGNIFICANT EVENTS: 03/27/16>>  Patient admitted to the ICU with hypotension, acute on  chronic kidney disease and hyperglycemia.   LINES/TUBES: None     ASSESSMENT / PLAN:  PULMONARY A: Tobacco abuse P:  Keep O2 Sats >92% Tobacco cessation Councelling  CARDIOVASCULAR A:  Cardiogenic Shock Ischemic cardiaomyopathy with EF of 30-35% Hyperlipidemia Severe hypotension secondary to dehydration from poor by mouth intake P:  Continuous Telemetry Continue levophed for MAP >65 Hold lisinopril/spirinolactone/coreg Continue Aspirin/ Brilinta   RENAL A:   Acute on Chronic Kidney Disease Hyponatremia P:   Strict I/O Replace electrolytes per ICU protocol Nephrology consulted  GASTROINTESTINAL A:   No active issues P:   HH carb modified diet  HEMATOLOGIC A:   No active issues P:  Heparin for DVT prophylaxis  INFECTIOUS A:   No active issues P:   Monitor fever curve  ENDOCRINE A:   Diabetes Melitus  P:   BS Checks  with ssi coverage Rest of the management per  primary   NEUROLOGIC A:    No active issues P:   Minimize sedating drugs     Bincy Varughese,AG-ACNP Pulmonary and Critical Care Medicine [facility]   03/27/2016, 11:27 PM

## 2016-03-27 NOTE — H&P (Signed)
[facility] - [facility]   PATIENT NAME: Damon LikesJoel Levert    MR#:  [id number]  DATE OF BIRTH:  10-02-1950  DATE OF ADMISSION:  03/27/2016  PRIMARY CARE PHYSICIAN: Emogene MorganAYCOCK, [name], MD   REQUESTING/REFERRING PHYSICIAN: Dr. Fanny Bienquale  CHIEF COMPLAINT:   Generalized weakness poor by mouth intake not feeling well HISTORY OF PRESENT ILLNESS:  Damon LikesJoel Brownfield  is a [age] y.o. 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 Patient said he had some unsteady gait as well. Denies any syncope or loss of consciousness.  In the ER patient was found to have blood pressure in the 70s. He was also found to have creatinine of 3.12. He has been taking his medication which includes spironolactone lisinopril and Lasix along with his other cardiac meds. Patient is being admitted with severe hypotension and acute on chronic renal failure due to dehydration and uncontrolled diabetes with blood sugar of 523.  Patient received a liter of IV fluids. Given his history of cardio myopathy and CHF will give him fluids slowly. He is being admitted in the ICU for IV insulin drip and IV fluids  PAST MEDICAL HISTORY:   Past Medical History:  Diagnosis Date  . CKD (chronic kidney disease), stage III   . Diabetes mellitus without complication (HCC)   . Diverticulitis   . Essential hypertension   . MI (myocardial infarction)   . PAD (peripheral artery disease) (HCC)    a. 10/2015 s/p PTA and stenting of the RCIA, LCIA, and PTA of the LEIA Environmental manager(Schnier).  . Tobacco abuse     PAST SURGICAL HISTOIRY:   Past Surgical History:  Procedure Laterality Date  . CARDIAC CATHETERIZATION N/A  01/13/2016   Procedure: LEFT HEART CATH AND CORONARY ANGIOGRAPHY;  Surgeon: Iran OuchMuhammad A Arida, MD;  Location: [facility] INVASIVE CV LAB;  Service: Cardiovascular;  Laterality: N/A;  . CARDIAC CATHETERIZATION N/A 01/13/2016   Procedure: Coronary Stent Intervention;  Surgeon: Iran OuchMuhammad A Arida, MD;  Location: [facility] INVASIVE CV LAB;  Service: Cardiovascular;  Laterality: N/A;  . CARDIAC CATHETERIZATION N/A 01/16/2016   Procedure: Left Heart Cath and Coronary Angiography;  Surgeon: Iran OuchMuhammad A Arida, MD;  Location: [facility] INVASIVE CV LAB;  Service: Cardiovascular;  Laterality: N/A;  . NECK SURGERY    . PERIPHERAL VASCULAR CATHETERIZATION Left 11/01/2015   Procedure: Lower Extremity Angiography;  Surgeon: Renford DillsGregory G Schnier, MD;  Location: [facility] INVASIVE CV LAB;  Service: Cardiovascular;  Laterality: Left;    SOCIAL HISTORY:   Social History  Substance Use Topics  . Smoking status: Current Every Day Smoker    Packs/day: 0.50    Years: 15.00    Types: Cigarettes  . Smokeless tobacco: Current User  . Alcohol use No    FAMILY HISTORY:   Family History  Problem Relation Age of Onset  . Intracerebral hemorrhage Mother   . Diabetes Mother   . Cancer Mother   . Diabetes Father     DRUG ALLERGIES:  No Known Allergies  REVIEW OF SYSTEMS:  Review of Systems  Constitutional: Negative for chills, fever and weight loss.  HENT: Negative for ear discharge, ear pain and nosebleeds.   Eyes: Negative for blurred vision, pain  and discharge.  Respiratory: Negative for sputum production, shortness of breath, wheezing and stridor.   Cardiovascular: Negative for chest pain, palpitations, orthopnea and PND.  Gastrointestinal: Negative for abdominal pain, diarrhea, nausea and vomiting.  Genitourinary: Negative for frequency and urgency.  Musculoskeletal: Negative for back pain and joint pain.  Neurological: Positive for weakness. Negative for sensory change, speech change and focal weakness.  Psychiatric/Behavioral:  Negative for depression and hallucinations. The patient is not nervous/anxious.      MEDICATIONS AT HOME:   Prior to Admission medications   Medication Sig Start Date End Date Taking? Authorizing Provider  ARIPiprazole (ABILIFY) 15 MG tablet Take 15 mg by mouth daily.   Yes Historical Provider, MD  aspirin EC 81 MG tablet Take 1 tablet (81 mg total) by mouth daily. 02/07/16  Yes Ryan M Dunn, PA-C  atorvastatin (LIPITOR) 80 MG tablet Take 1 tablet (80 mg total) by mouth daily at 6 PM. 01/17/16  Yes Enid Baas, MD  carvedilol (COREG) 25 MG tablet Take 1 tablet (25 mg total) by mouth 2 (two) times daily with a meal. 02/01/16  Yes Ryan M Dunn, PA-C  furosemide (LASIX) 20 MG tablet Take 1 tablet (20 mg total) by mouth every other day. 02/07/16  Yes Ryan M Dunn, PA-C  insulin glargine (LANTUS) 100 UNIT/ML injection Inject 30 Units into the skin every evening.    Yes Historical Provider, MD  lisinopril (PRINIVIL,ZESTRIL) 20 MG tablet Take 1 tablet (20 mg total) by mouth daily. 01/18/16  Yes Enid Baas, MD  spironolactone (ALDACTONE) 25 MG tablet Take 1 tablet (25 mg total) by mouth daily. 01/17/16  Yes Enid Baas, MD  ticagrelor (BRILINTA) 90 MG TABS tablet Take 1 tablet (90 mg total) by mouth 2 (two) times daily. 01/17/16  Yes Enid Baas, MD  gabapentin (NEURONTIN) 300 MG capsule Take 300 mg by mouth 2 (two) times daily.    Historical Provider, MD      VITAL SIGNS:  Blood pressure (!) 91/56, pulse 74, temperature 98.1 F (36.7 C), temperature source Oral, resp. rate 14, height 5\' 10"  (1.778 m), weight 63.5 kg (140 lb), SpO2 100 %.  PHYSICAL EXAMINATION:  GENERAL:  65 y.o.-year-old patient lying in the bed with no acute distress.  EYES: Pupils equal, round, reactive to light and accommodation. No scleral icterus. Extraocular muscles intact.  HEENT: Head atraumatic, normocephalic. Oropharynx and nasopharynx clear. Dry oral mucosa NECK:  Supple, no jugular venous  distention. No thyroid enlargement, no tenderness.  LUNGS: Normal breath sounds bilaterally, no wheezing, rales,rhonchi or crepitation. No use of accessory muscles of respiration.  CARDIOVASCULAR: S1, S2 normal. No murmurs, rubs, or gallops.  ABDOMEN: Soft, nontender, nondistended. Bowel sounds present. No organomegaly or mass.  EXTREMITIES: No pedal edema, cyanosis, or clubbing.  NEUROLOGIC: Cranial nerves II through XII are intact. Muscle strength 5/5 in all extremities. Sensation intact. Gait not checked.  PSYCHIATRIC: The patient is alert and oriented x 3.  SKIN: No obvious rash, lesion, or ulcer.   LABORATORY PANEL:   CBC  Recent Labs Lab 03/27/16 1519  WBC 8.5  HGB 13.7  HCT 40.4  PLT 266   ------------------------------------------------------------------------------------------------------------------  Chemistries   Recent Labs Lab 03/27/16 1519  NA 126*  K 5.1  CL 97*  CO2 19*  GLUCOSE 523*  BUN 59*  CREATININE 3.12*  CALCIUM 8.8*  AST 21  ALT 25  ALKPHOS 134*  BILITOT 0.1*   ------------------------------------------------------------------------------------------------------------------  Cardiac Enzymes  Recent Labs Lab 03/27/16 1519  TROPONINI 0.08*   ------------------------------------------------------------------------------------------------------------------  RADIOLOGY:  Dg Chest Portable 1 View  Result Date: 03/27/2016 CLINICAL DATA:  Weakness and shortness of breath since yesterday. MI in November 2017. The patient has undergone coronary stent placement. Current smoker. EXAM: PORTABLE CHEST 1 VIEW COMPARISON:  Chest x-ray of February 14, 2016 FINDINGS: The lungs remain hyperinflated. There is no focal infiltrate. There is no pneumothorax, pneumomediastinum, or pleural effusion. The heart and pulmonary vascularity are normal. There is calcification in the wall of the aortic arch. The observed bony thorax is normal. IMPRESSION: Hyperinflation  consistent with COPD or reactive airway disease. There is no pneumonia, CHF, nor other acute cardiopulmonary abnormality. Thoracic aortic atherosclerosis. Electronically Signed   By: David  SwazilandJordan M.D.   On: 03/27/2016 15:44    EKG:  Left atrial enlargement, LAFB, LVH  IMPRESSION AND PLAN:   Damon [name]  is a 65 y.o. 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 Patient said he had some unsteady gait as well. Denies any syncope or loss of consciousness.   1. Severe hypotension secondary to dehydration from poor by mouth intake -Admit to ICU stepdown -IV fluids will give at a slow rate of 75 cc an hour since patient has cardiomyopathy and risk of CHF -Consider pressors if needed -Hold BP meds  2. Uncontrolled diabetes with sugars in the 523 -Non-DKA insulin drip. Pharmacy to dose and start insulin drip. -Patient takes Lantus 30 units at bedtime. His nocturnal meds. Consider adding short- acting insulin for covering meals  3. Acute renal failure on chronic renal failure Baseline creatinine 1.3 -Nephrology consultation. Spoke with Dr. Dr. Cherylann RatelLateef -IV fluids monitor I's and O's, avoid nephrotoxins, hold lisinopril spironolactoneand Lasix  4. Ischemic cardiac myopathy with EF of 30-35% -Continue aspirin Brilinta and Coreg  5. Hyperlipidemia on atorvastatin  6. Generalized weakness physical therapy to see  7. Poor by mouth intake with elevated sugars and generalized weakness will have dietitian see patient and help with meal plan  I will was discussed with patient and patient's sister was present in the ER.  All the records are reviewed and case discussed with ED  provider. Management plans discussed with the patient, family and they are in agreement.  CODE STATUS: full  TOTAL criticalTIME TAKING CARE OF THIS PATIENT: 55minutes.    Paulina Muchmore M.D on 03/27/2016 at 5:15 PM  Between 7am to 6pm - Pager - [phone]  After 6pm go to [email or web address] - password [email or web address] [email or web address]  [facility]  Office  [phone]  CC: Primary care physician; Emogene MorganAYCOCK, [name], MD

## 2016-03-27 NOTE — Progress Notes (Signed)
Patient became hypotensive at beginning of my shift. Paged hospitalist. Received new orders, see eMAR. Followed through with orders received. Throughout my shift, spoke with hospitalist via telephone about patient. See eMAR regarding orders received. Followed though with orders received. Intensive care team consulted on the patient, working with NP on the floor regarding blood pressure   Closely monitoring patient's lung sounds, orientation status and respiratory status. Patient is currently resting in bed on room air.

## 2016-03-28 DIAGNOSIS — E43 Unspecified severe protein-calorie malnutrition: Secondary | ICD-10-CM | POA: Insufficient documentation

## 2016-03-28 LAB — GLUCOSE, CAPILLARY
GLUCOSE-CAPILLARY: 100 mg/dL — AB (ref 65–99)
GLUCOSE-CAPILLARY: 115 mg/dL — AB (ref 65–99)
GLUCOSE-CAPILLARY: 130 mg/dL — AB (ref 65–99)
GLUCOSE-CAPILLARY: 300 mg/dL — AB (ref 65–99)
GLUCOSE-CAPILLARY: 320 mg/dL — AB (ref 65–99)
Glucose-Capillary: 146 mg/dL — ABNORMAL HIGH (ref 65–99)
Glucose-Capillary: 253 mg/dL — ABNORMAL HIGH (ref 65–99)
Glucose-Capillary: 299 mg/dL — ABNORMAL HIGH (ref 65–99)

## 2016-03-28 LAB — BASIC METABOLIC PANEL
ANION GAP: 7 (ref 5–15)
Anion gap: 7 (ref 5–15)
BUN: 58 mg/dL — ABNORMAL HIGH (ref 6–20)
BUN: 59 mg/dL — ABNORMAL HIGH (ref 6–20)
CHLORIDE: 107 mmol/L (ref 101–111)
CHLORIDE: 108 mmol/L (ref 101–111)
CO2: 21 mmol/L — AB (ref 22–32)
CO2: 22 mmol/L (ref 22–32)
Calcium: 8.3 mg/dL — ABNORMAL LOW (ref 8.9–10.3)
Calcium: 8.5 mg/dL — ABNORMAL LOW (ref 8.9–10.3)
Creatinine, Ser: 2.57 mg/dL — ABNORMAL HIGH (ref 0.61–1.24)
Creatinine, Ser: 3.08 mg/dL — ABNORMAL HIGH (ref 0.61–1.24)
GFR calc non Af Amer: 20 mL/min — ABNORMAL LOW (ref >60)
GFR calc non Af Amer: 25 mL/min — ABNORMAL LOW (ref >60)
GFR, EST AFRICAN AMERICAN: 29 mL/min — AB (ref >60)
GFR, EST AFRICAN AMERICAN: 23 mL/min — AB (ref >60)
Glucose, Bld: 135 mg/dL — ABNORMAL HIGH (ref 65–99)
Glucose, Bld: 59 mg/dL — ABNORMAL LOW (ref 65–99)
POTASSIUM: 4 mmol/L (ref 3.5–5.1)
Potassium: 4.7 mmol/L (ref 3.5–5.1)
SODIUM: 135 mmol/L (ref 135–145)
SODIUM: 137 mmol/L (ref 135–145)

## 2016-03-28 LAB — LACTIC ACID, PLASMA: LACTIC ACID, VENOUS: 1.8 mmol/L (ref 0.5–1.9)

## 2016-03-28 LAB — PHOSPHORUS: Phosphorus: 3.8 mg/dL (ref 2.5–4.6)

## 2016-03-28 MED ORDER — INSULIN ASPART 100 UNIT/ML ~~LOC~~ SOLN
0.0000 [IU] | Freq: Three times a day (TID) | SUBCUTANEOUS | Status: DC
  Administered 2016-03-28 (×2): 5 [IU] via SUBCUTANEOUS
  Administered 2016-03-28: 1 [IU] via SUBCUTANEOUS
  Administered 2016-03-29: 2 [IU] via SUBCUTANEOUS
  Administered 2016-03-29: 5 [IU] via SUBCUTANEOUS
  Filled 2016-03-28: qty 2
  Filled 2016-03-28: qty 5
  Filled 2016-03-28: qty 1
  Filled 2016-03-28 (×2): qty 5

## 2016-03-28 MED ORDER — INSULIN GLARGINE 100 UNIT/ML ~~LOC~~ SOLN
30.0000 [IU] | Freq: Every day | SUBCUTANEOUS | Status: DC
  Administered 2016-03-28 – 2016-03-29 (×2): 30 [IU] via SUBCUTANEOUS
  Filled 2016-03-28 (×3): qty 0.3

## 2016-03-28 MED ORDER — GLUCERNA SHAKE PO LIQD
237.0000 mL | Freq: Three times a day (TID) | ORAL | Status: DC
  Administered 2016-03-28 (×2): 237 mL via ORAL
  Administered 2016-03-28: 22:00:00 via ORAL
  Administered 2016-03-29: 237 mL via ORAL

## 2016-03-28 MED ORDER — INSULIN ASPART 100 UNIT/ML ~~LOC~~ SOLN
0.0000 [IU] | Freq: Every day | SUBCUTANEOUS | Status: DC
  Administered 2016-03-28: 3 [IU] via SUBCUTANEOUS
  Filled 2016-03-28: qty 3

## 2016-03-28 MED ORDER — NOREPINEPHRINE 4 MG/250ML-% IV SOLN: 2.0000 ug/min | INTRAVENOUS | Status: DC

## 2016-03-28 MED ORDER — DOPAMINE-DEXTROSE 3.2-5 MG/ML-% IV SOLN: 0.0000 ug/kg/min | INTRAVENOUS | Status: DC

## 2016-03-28 MED ORDER — SODIUM CHLORIDE 0.9 % IV SOLN
INTRAVENOUS | Status: AC
  Administered 2016-03-28: 05:00:00 via INTRAVENOUS

## 2016-03-28 MED ORDER — DEXTROSE 5 % IV SOLN: 2.0000 ug/min | INTRAVENOUS | Status: DC

## 2016-03-28 NOTE — Care Management Note (Addendum)
Case Management Note  Patient Details  Name: Damon [name] MRN: [id number] Date of Birth: [birth year]  Subjective/Objective:                   Met with patient to discuss home health services. Patient states he is from home with his wife and has had some problems with weakness/ambulation. He states that he does consider himself as homebound as it is taxing and wavering to get out of the home right now. He has asked for walker from family and denies need for CM assistance with DME. He denies problems  obtaining medications but agrees that he may need more education about his diabetes and medications. He states that the best number to contact him is [phone]. He has no preference for home health agencies and [facility] to [facility]. [facility] PCP is with [facility] and has an appointment Tuesday 12/26. Action/Plan:  Provided patient with list of home health agencies in network with his [place]. He agrees to [facility] and [place]. He understands that [facility] will contact him within 72 hours to arrange appointment. Contact number shared with Damon [name] with [facility] to contact patient. RNCM will continue to follow.   Expected Discharge Date:                  Expected Discharge Plan:     In-House Referral:     Discharge planning Services  CM Consult  Post Acute Care Choice:  Home Health Choice offered to:  Patient  DME Arranged:    DME Agency:     HH Arranged:  RN, PT [place] Agency:  [place]  Status of Service:  In process, will continue to follow  If discussed at Long Length of Stay Meetings, dates discussed:    Additional Comments:  [name], RN 03/28/2016, 9:54 AM

## 2016-03-28 NOTE — Consult Note (Signed)
[facility] CONSULT NOTE    Date: 03/28/2016                  Patient Name:  Damon [name]  MRN: [id number]  DOB: Jun 23, 1950  Age / Sex: 65 y.o., male         PCP: Donnie Coffin, MD                 Service Requesting Consult: Dr. Fritzi Mandes                 Reason for Consult: Acute renal failure            History of Present Illness: Patient is a 65 y.o. male with a PMHx of Cardiomyopathy with ejection fraction 30-35%, coronary artery disease status post stent in the LAD and OM1 branch in October 2017, diabetes mellitus type 2, chronic kidney disease stage III, hypertension, peripheral arterial disease, and lower extremity edema who presented to [facility] with poor by mouth intake and generalized malaise. Patient reports to me that he has not been eating very well over the past 2 weeks. He's had some nausea but no vomiting. He also denies diarrhea. Intermittently he took ibuprofen.  We are asked to see him for evaluation and management of acute renal failure. His baseline creatinine is 1.6 from 02/15/2016 with an EGFR 44. When he presented creatinine was as high as 3.12. With IV fluid hydration creatinine has come down to 2.57. He is being transitioned to floor care today.  Medications: Outpatient medications: Prescriptions Prior to Admission  Medication Sig Dispense Refill Last Dose  . ARIPiprazole (ABILIFY) 15 MG tablet Take 15 mg by mouth daily.   03/27/2016 at 1000  . aspirin EC 81 MG tablet Take 1 tablet (81 mg total) by mouth daily. 150 tablet 2 03/27/2016 at 1000  . atorvastatin (LIPITOR) 80 MG tablet Take 1 tablet (80 mg total) by mouth daily at 6 PM. 30 tablet 2 03/26/2016 at Unknown time  . carvedilol (COREG) 25 MG tablet Take 1 tablet (25 mg total) by mouth 2 (two) times daily with a meal. 60 tablet 2 03/27/2016 at 1000  . furosemide (LASIX) 20 MG tablet Take 1 tablet (20 mg total) by mouth every other day. 30 tablet 2 03/27/2016  at 1000  . insulin glargine (LANTUS) 100 UNIT/ML injection Inject 30 Units into the skin every evening.    03/26/2016 at Unknown time  . lisinopril (PRINIVIL,ZESTRIL) 20 MG tablet Take 1 tablet (20 mg total) by mouth daily. 30 tablet 2 03/27/2016 at 1000  . spironolactone (ALDACTONE) 25 MG tablet Take 1 tablet (25 mg total) by mouth daily. 30 tablet 2 03/27/2016 at 1000  . ticagrelor (BRILINTA) 90 MG TABS tablet Take 1 tablet (90 mg total) by mouth 2 (two) times daily. 60 tablet 2 03/27/2016 at 1000  . gabapentin (NEURONTIN) 300 MG capsule Take 300 mg by mouth 2 (two) times daily.   Not Taking at Unknown time    Current medications: Current Facility-Administered Medications  Medication Dose Route Frequency Provider Last Rate Last Dose  . 0.9 %  sodium chloride infusion   Intravenous Continuous Fritzi Mandes, MD   Stopped at 03/27/16 2003  . acetaminophen (TYLENOL) tablet 650 mg  650 mg Oral Q6H PRN Fritzi Mandes, MD       Or  . acetaminophen (TYLENOL) suppository 650 mg  650 mg Rectal Q6H PRN Fritzi Mandes, MD      .  ARIPiprazole (ABILIFY) tablet 15 mg  15 mg Oral Daily Fritzi Mandes, MD   15 mg at 03/28/16 1057  . aspirin EC tablet 81 mg  81 mg Oral Daily Fritzi Mandes, MD   81 mg at 03/28/16 1057  . atorvastatin (LIPITOR) tablet 80 mg  80 mg Oral q1800 Fritzi Mandes, MD      . dextrose 50 % solution 25 mL  25 mL Intravenous PRN Fritzi Mandes, MD      . feeding supplement (GLUCERNA SHAKE) (GLUCERNA SHAKE) liquid 237 mL  237 mL Oral TID BM Lytle Butte, MD   237 mL at 03/28/16 1400  . heparin injection 5,000 Units  5,000 Units Subcutaneous Q8H Fritzi Mandes, MD   5,000 Units at 03/28/16 1428  . insulin aspart (novoLOG) injection 0-5 Units  0-5 Units Subcutaneous QHS Lance Coon, MD      . insulin aspart (novoLOG) injection 0-9 Units  0-9 Units Subcutaneous TID WC Lance Coon, MD   5 Units at 03/28/16 1644  . insulin glargine (LANTUS) injection 30 Units  30 Units Subcutaneous Daily Lytle Butte, MD   30 Units at  03/28/16 1430  . ondansetron (ZOFRAN) tablet 4 mg  4 mg Oral Q6H PRN Fritzi Mandes, MD       Or  . ondansetron (ZOFRAN) injection 4 mg  4 mg Intravenous Q6H PRN Fritzi Mandes, MD      . ticagrelor (BRILINTA) tablet 90 mg  90 mg Oral BID Fritzi Mandes, MD   90 mg at 03/28/16 1057  . traMADol (ULTRAM) tablet 50 mg  50 mg Oral Q8H PRN Fritzi Mandes, MD          Allergies: No Known Allergies    Past Medical History: Past Medical History:  Diagnosis Date  . CKD (chronic kidney disease), stage III   . Diabetes mellitus without complication ([place])   . Diverticulitis   . Essential hypertension   . MI (myocardial infarction)   . PAD (peripheral artery disease) ([place])    a. 10/2015 s/p PTA and stenting of the RCIA, LCIA, and PTA of the LEIA [employer]).  . Tobacco abuse      Past Surgical History: Past Surgical History:  Procedure Laterality Date  . CARDIAC CATHETERIZATION N/A 01/13/2016   Procedure: LEFT HEART CATH AND CORONARY ANGIOGRAPHY;  Surgeon: Wellington Hampshire, MD;  Location: [place] [facility];  Service: Cardiovascular;  Laterality: N/A;  . CARDIAC CATHETERIZATION N/A 01/13/2016   Procedure: Coronary Stent Intervention;  Surgeon: Wellington Hampshire, MD;  Location: [place] [facility];  Service: Cardiovascular;  Laterality: N/A;  . CARDIAC CATHETERIZATION N/A 01/16/2016   Procedure: Left Heart Cath and Coronary Angiography;  Surgeon: Wellington Hampshire, MD;  Location: [place] [facility];  Service: Cardiovascular;  Laterality: N/A;  . NECK SURGERY    . PERIPHERAL VASCULAR CATHETERIZATION Left 11/01/2015   Procedure: Lower Extremity Angiography;  Surgeon: Katha Cabal, MD;  Location: [place] [facility];  Service: Cardiovascular;  Laterality: Left;     Family History: Family History  Problem Relation Age of Onset  . Intracerebral hemorrhage Mother   . Diabetes Mother   . Cancer Mother   . Diabetes Father      Social History: Social History   Social History  . Marital status:  Married    Spouse name: N/A  . Number of children: N/A  . Years of education: N/A   Occupational History  . Not on file.   Social History Main  Topics  . Smoking status: Current Every Day Smoker    Packs/day: 0.50    Years: 15.00    Types: Cigarettes  . Smokeless tobacco: Current User  . Alcohol use No  . Drug use: No  . Sexual activity: Not on file   Other Topics Concern  . Not on file   Social History Narrative   Lives in [place] with his wife.  Does not routinely exercise.     Review of Systems: Review of Systems  Constitutional: Positive for malaise/fatigue. Negative for chills, fever and weight loss.  HENT: Negative for ear pain, hearing loss and tinnitus.   Eyes: Negative for blurred vision, double vision and pain.  Respiratory: Negative for cough, hemoptysis and sputum production.   Cardiovascular: Negative for chest pain, palpitations, orthopnea and leg swelling.  Gastrointestinal: Positive for nausea. Negative for heartburn.  Genitourinary: Negative for dysuria, frequency and urgency.  Musculoskeletal: Negative for back pain and myalgias.  Skin: Negative for itching and rash.  Neurological: Positive for dizziness. Negative for focal weakness.  Endo/Heme/Allergies: Negative for polydipsia. Does not bruise/bleed easily.  Psychiatric/Behavioral: Negative for depression and hallucinations.     Vital Signs: Blood pressure (!) 132/56, pulse 83, temperature 97.9 F (36.6 C), temperature source Oral, resp. rate (!) 21, height _0  (1.753 m), weight 65.2 kg (143 lb 11.8 oz), SpO2 97 %.  Weight trends: Filed Weights   03/27/16 1453 03/27/16 1946  Weight: 63.5 kg (140 lb) 65.2 kg (143 lb 11.8 oz)    Physical Exam: General: NAD, sitting up in bed  Head: Normocephalic, atraumatic.  Eyes: Anicteric, EOMI  Nose: Mucous membranes moist, not inflammed, nonerythematous.  Throat: Oropharynx nonerythematous, no exudate appreciated.   Neck: Supple, trachea midline.   Lungs:  Normal respiratory effort. Clear to auscultation BL without crackles or wheezes.  Heart: RRR. S1 and S2 normal without gallop, murmur, or rubs.  Abdomen:  BS normoactive. Soft, Nondistended, non-tender.  No masses or organomegaly.  Extremities: No pretibial edema.  Neurologic: A&O X3, Motor strength is 5/5 in the all 4 extremities  Skin: No visible rashes, scars.    Lab results: Basic Metabolic Panel:  Recent Labs Lab 03/27/16 1519 03/27/16 2006 03/27/16 2333 03/28/16 0600  NA 126*  --  137 135  K 5.1  --  4.0 4.7  CL 97*  --  108 107  CO2 19*  --  22 21*  GLUCOSE 523*  --  59* 135*  BUN 59*  --  59* 58*  CREATININE 3.12* 2.80* 3.08* 2.57*  CALCIUM 8.8*  --  8.3* 8.5*    Liver Function Tests:  Recent Labs Lab 03/27/16 1519  AST 21  ALT 25  ALKPHOS 134*  BILITOT 0.1*  PROT 7.3  ALBUMIN 3.7    Recent Labs Lab 03/27/16 1519  LIPASE 33   No results for input(s): AMMONIA in the last 168 hours.  CBC:  Recent Labs Lab 03/27/16 1519 03/27/16 2006  WBC 8.5 9.9  HGB 13.7 13.8  HCT 40.4 41.1  MCV 92.2 93.5  PLT 266 250    Cardiac Enzymes:  Recent Labs Lab 03/27/16 1519  TROPONINI 0.08*    BNP: Invalid input(s): POCBNP  CBG:  Recent Labs Lab 03/28/16 0412 03/28/16 0748 03/28/16 1204 03/28/16 1430 03/28/16 1633  GLUCAP 130* 146* 253* 320* 299*    Microbiology: Results for orders placed or performed during the hospital encounter of 03/27/16  MRSA PCR Screening     Status: None   Collection Time:  03/27/16  7:34 PM  Result Value Ref Range Status   MRSA by PCR NEGATIVE NEGATIVE Final    Comment:        The GeneXpert MRSA Assay (FDA approved for NASAL specimens only), is one component of a comprehensive MRSA colonization surveillance program. It is not intended to diagnose MRSA infection nor to guide or monitor treatment for MRSA infections.     Coagulation Studies: No results for input(s): LABPROT, INR in the last 72  hours.  Urinalysis: No results for input(s): COLORURINE, LABSPEC, PHURINE, GLUCOSEU, HGBUR, BILIRUBINUR, KETONESUR, PROTEINUR, UROBILINOGEN, NITRITE, LEUKOCYTESUR in the last 72 hours.  Invalid input(s): APPERANCEUR    Imaging: Dg Chest Portable 1 View  Result Date: 03/27/2016 CLINICAL DATA:  Weakness and shortness of breath since yesterday. MI in November 2017. The patient has undergone coronary stent placement. Current smoker. EXAM: PORTABLE CHEST 1 VIEW COMPARISON:  Chest x-ray of February 14, 2016 FINDINGS: The lungs remain hyperinflated. There is no focal infiltrate. There is no pneumothorax, pneumomediastinum, or pleural effusion. The heart and pulmonary vascularity are normal. There is calcification in the wall of the aortic arch. The observed bony thorax is normal. IMPRESSION: Hyperinflation consistent with COPD or reactive airway disease. There is no pneumonia, CHF, nor other acute cardiopulmonary abnormality. Thoracic aortic atherosclerosis. Electronically Signed   By: David  Martinique M.D.   On: 03/27/2016 15:44      Assessment & Plan: Pt is a 65 y.o. male  with a PMHx of Cardiomyopathy with ejection fraction 30-35%, coronary artery disease status post stent in the LAD and OM1 branch in October 2017, diabetes mellitus type 2, chronic kidney disease stage III, hypertension, peripheral arterial disease, and lower extremity edema who presented to [facility] with poor by mouth intake and generalized malaise.   1.  Acute renal failure due to poor PO intake and intermittent usage of ibuprofen 2.  CKD stage III baseline Cr 1.6 3.  Hypotension, improved. 4.  Hyponatremia, improved, Na 135 today, 126 upon admission.  Plan:  We were asked to see the patient for evaluation management of acute renal failure in the setting of chronic kidney disease stage III.  Upon presentation the patient's creatinine was 3.12. With IV fluid hydration creatinine has come down to 2.5 however  BUN remains elevated at 58. We will obtain renal ultrasound, SPEP, UPEP, ANA, ANCA antibodies, GBM antibodies, C3, and C4.  For now continue IV fluid hydration. Follow renal function in the a.m.  Avoid nephrotoxins including NSAIDs. Continue to monitor the patient's blood pressure trend as he was significantly hypotensive upon admission. Further plan based upon studies from above.

## 2016-03-28 NOTE — Progress Notes (Signed)
[facility] [place] [facility]   PATIENT NAME: Damon LikesJoel [name]    MRN#:  [id number]  DATE OF BIRTH:  07-19-1950  SUBJECTIVE:  Hospital Day: 1 day Damon LikesJoel [name] is a 65 y.o. male presenting with Weakness .   Overnight events: No acute overnight events patient weaned off Levophed this morning Interval Events: No complaints offered insulin drip off Levophed standing at bedside states feels much better  REVIEW OF SYSTEMS:  CONSTITUTIONAL: No fever, fatigue or weakness.  EYES: No blurred or double vision.  EARS, NOSE, AND THROAT: No tinnitus or ear pain.  RESPIRATORY: No cough, shortness of breath, wheezing or hemoptysis.  CARDIOVASCULAR: No chest pain, orthopnea, edema.  GASTROINTESTINAL: No nausea, vomiting, diarrhea or abdominal pain.  GENITOURINARY: No dysuria, hematuria.  ENDOCRINE: No polyuria, nocturia,  HEMATOLOGY: No anemia, easy bruising or bleeding SKIN: No rash or lesion. MUSCULOSKELETAL: No joint pain or arthritis.   NEUROLOGIC: No tingling, numbness, weakness.  PSYCHIATRY: No anxiety or depression.   DRUG ALLERGIES:  No Known Allergies  VITALS:  Blood pressure (!) 112/59, pulse 83, temperature 98.3 F (36.8 C), temperature source Oral, resp. rate (!) 21, height 5\' 9"  (1.753 m), weight 65.2 kg (143 lb 11.8 oz), SpO2 97 %.  PHYSICAL EXAMINATION:  VITAL SIGNS: Vitals:   03/28/16 1330 03/28/16 1345  BP: (!) 111/59 (!) 112/59  Pulse:    Resp: (!) 21   Temp:     GENERAL:65 y.o.male currently in no acute distress.  HEAD: Normocephalic, atraumatic.  EYES: Pupils equal, round, reactive to light. Extraocular muscles intact. No scleral icterus.  MOUTH: Moist mucosal membrane. Dentition intact. No abscess noted.  EAR, NOSE, THROAT: Clear without exudates. No external lesions.  NECK: Supple. No thyromegaly. No nodules. No JVD.  PULMONARY: Clear to ascultation, without wheeze rails or rhonci. No use of accessory muscles, Good respiratory effort.  good air entry bilaterally CHEST: Nontender to palpation.  CARDIOVASCULAR: S1 and S2. Regular rate and rhythm. No murmurs, rubs, or gallops. No edema. Pedal pulses 2+ bilaterally.  GASTROINTESTINAL: Soft, nontender, nondistended. No masses. Positive bowel sounds. No hepatosplenomegaly.  MUSCULOSKELETAL: No swelling, clubbing, or edema. Range of motion full in all extremities.  NEUROLOGIC: Cranial nerves II through XII are intact. No gross focal neurological deficits. Sensation intact. Reflexes intact.  SKIN: No ulceration, lesions, rashes, or cyanosis. Skin warm and dry. Turgor intact.  PSYCHIATRIC: Mood, affect within normal limits. The patient is awake, alert and oriented x 3. Insight, judgment intact.      LABORATORY PANEL:   CBC  Recent Labs Lab 03/27/16 2006  WBC 9.9  HGB 13.8  HCT 41.1  PLT 250   ------------------------------------------------------------------------------------------------------------------  Chemistries   Recent Labs Lab 03/27/16 1519  03/28/16 0600  NA 126*  < > 135  K 5.1  < > 4.7  CL 97*  < > 107  CO2 19*  < > 21*  GLUCOSE 523*  < > 135*  BUN 59*  < > 58*  CREATININE 3.12*  < > 2.57*  CALCIUM 8.8*  < > 8.5*  AST 21  --   --   ALT 25  --   --   ALKPHOS 134*  --   --   BILITOT 0.1*  --   --   < > = values in this interval not displayed. ------------------------------------------------------------------------------------------------------------------  Cardiac Enzymes  Recent Labs Lab 03/27/16 1519  TROPONINI 0.08*   ------------------------------------------------------------------------------------------------------------------  RADIOLOGY:  Dg Chest Portable 1 View  Result Date: 03/27/2016  CLINICAL DATA:  Weakness and shortness of breath since yesterday. MI in November 2017. The patient has undergone coronary stent placement. Current smoker. EXAM: PORTABLE CHEST 1 VIEW COMPARISON:  Chest x-ray of February 14, 2016 FINDINGS: The  lungs remain hyperinflated. There is no focal infiltrate. There is no pneumothorax, pneumomediastinum, or pleural effusion. The heart and pulmonary vascularity are normal. There is calcification in the wall of the aortic arch. The observed bony thorax is normal. IMPRESSION: Hyperinflation consistent with COPD or reactive airway disease. There is no pneumonia, CHF, nor other acute cardiopulmonary abnormality. Thoracic aortic atherosclerosis. Electronically Signed   By: David  SwazilandJordan M.D.   On: 03/27/2016 15:44    EKG:   Orders placed or performed in visit on 03/27/16  . EKG 12-Lead    ASSESSMENT AND PLAN:   Damon [name] is a 65 y.o. male presenting with Weakness . Admitted 03/27/2016 : Day #: 1 day 1. Hypovolemic shock: Resolved 2. Acute renal failure: Improved continue IV fluids 3. Type 2 diabetes insulin requiring: Off insulin drip restart basal insulin and continue sliding scale coverage 4. Hyperlipidemia unspecified: Statin therapy  Disposition: Transfer out of ICU, physical therapy to evaluate if kidney function continues to improve likely anticipate discharge next day or so  All the records are reviewed and case discussed with Care Management/Social Workerr. Management plans discussed with the patient, family and they are in agreement.  CODE STATUS: full TOTAL TIME TAKING CARE OF THIS PATIENT: 28 minutes.   POSSIBLE D/C IN 1DAYS, DEPENDING ON CLINICAL CONDITION.   Hower,  Mardi MainlandDavid K M.D on 03/28/2016 at 3:04 PM  Between 7am to 6pm - Pager - [phone]  After 6pm: House Pager: - [phone]  [facility]  Office  [phone]  CC: Primary care physician; Emogene MorganAYCOCK, [name], MD

## 2016-03-28 NOTE — Progress Notes (Signed)
Inpatient Diabetes Program Recommendations  AACE/ADA: New Consensus Statement on Inpatient Glycemic Control (2015)  Target Ranges:  Prepandial:   less than 140 mg/dL      Peak postprandial:   less than 180 mg/dL (1-2 hours)      Critically ill patients:  140 - 180 mg/dL   Lab Results  Component Value Date   GLUCAP 146 (H) 03/28/2016   HGBA1C 8.3 (H) 01/16/2016    Review of Glycemic Control:  Note patient admitted with blood sugar>500 mg/dL  Diabetes history: Type 2 diabetes Outpatient Diabetes medications: Lantus 30 units q PM Current orders for Inpatient glycemic control:  Novolog sensitive tid with meals and HS  Inpatient Diabetes Program Recommendations:   Please consider restarting home dose of Lantus 30 units q HS.    Thanks, Beryl MeagerJenny Berklee Battey, RN, BC-ADM Inpatient Diabetes Coordinator Pager [phone] (8a-5p)

## 2016-03-28 NOTE — Evaluation (Signed)
Physical Therapy Evaluation Patient Details Name: Damon [name] MRN: [id number] DOB: 07-03-1950 Today's Date: 03/28/2016   History of Present Illness  presented to ER secondary to generalized weakness, decreased PO intake; admitted with severe hypotension, dehydration and acute kidney injury.  Clinical Impression  Upon evaluation, patient alert and oriented; follows all commands and demonstrates fair/good insight and safety awareness.  Bilat UE/LE strength and ROM grossly symmetrical and WFL, except L LE with mild foot drop during mobility efforts.  Able to complete bed mobility with mod indep; sit/stand, basic transfers and gait (100') with RW, cga.  Mild unsteadiness, but no overt LOB noted; does prefer to maintain use of RW at this time. Vitals stable and WFL throughout; no subjective/objective orthostasis noted. Would benefit from skilled PT to address above deficits and promote optimal return to PLOF; Recommend transition to HHPT upon discharge from acute hospitalization.     Follow Up Recommendations Home health PT    Equipment Recommendations  Rolling walker with 5" wheels    Recommendations for Other Services       Precautions / Restrictions Precautions Precautions: Fall Restrictions Weight Bearing Restrictions: No      Mobility  Bed Mobility Overal bed mobility: Modified Independent                Transfers Overall transfer level: Needs assistance Equipment used: Rolling walker (2 wheeled) Transfers: Sit to/from Stand Sit to Stand: Min guard            Ambulation/Gait Ambulation/Gait assistance: Min guard;Min assist Ambulation Distance (Feet): 100 Feet Assistive device: Rolling walker (2 wheeled)       General Gait Details: reciprocal stepping pattern with mild foot drop to L LE (worsened with fatigue); mod reliance on RW, patient reporting optimal comfort/confidence with use of RW  Stairs            Wheelchair Mobility    Modified Rankin  (Stroke Patients Only)       Balance Overall balance assessment: Needs assistance Sitting-balance support: No upper extremity supported;Feet supported Sitting balance-Leahy Scale: Good     Standing balance support: Bilateral upper extremity supported Standing balance-Leahy Scale: Fair                               Pertinent Vitals/Pain Pain Assessment: No/denies pain    Home Living Family/patient expects to be discharged to:: Private residence Living Arrangements: Spouse/significant other Available Help at Discharge: Family Type of Home: House Home Access: Stairs to enter Entrance Stairs-Rails: None Entrance Stairs-Number of Steps: 6 Home Layout: One level Home Equipment: Cane - single point      Prior Function Level of Independence: Independent with assistive device(s)         Comments: Mod indep with ADLs, household and [facility]".  Endorses 2 falls within previous six months.     Hand Dominance        Extremity/Trunk Assessment   Upper Extremity Assessment Upper Extremity Assessment: Overall WFL for tasks assessed    Lower Extremity Assessment Lower Extremity Assessment: Overall WFL for tasks assessed       Communication   Communication: No difficulties  Cognition Arousal/Alertness: Awake/alert Behavior During Therapy: WFL for tasks assessed/performed Overall Cognitive Status: Within Functional Limits for tasks assessed                      General Comments  Exercises Other Exercises Other Exercises: Seated and standing UE/LE therex, 1x10, AROM for strength/flexibility and accommodation to upright   Assessment/Plan    PT Assessment Patient needs continued PT services  PT Problem List Decreased strength;Decreased range of motion;Decreased activity tolerance;Decreased balance;Decreased mobility;Decreased coordination;Decreased knowledge of use of DME;Decreased safety  awareness;Decreased knowledge of precautions;Cardiopulmonary status limiting activity          PT Treatment Interventions DME instruction;Gait training;Stair training;Functional mobility training;Therapeutic activities;Therapeutic exercise;Balance training;Patient/family education    PT Goals (Current goals can be found in the Care Plan section)  Acute Rehab PT Goals Patient Stated Goal: to return home, to get a walker like this PT Goal Formulation: With patient Time For Goal Achievement: 04/11/16 Potential to Achieve Goals: Good    Frequency Min 2X/week   Barriers to discharge Decreased caregiver support      Co-evaluation               End of Session Equipment Utilized During Treatment: Gait belt Activity Tolerance: Patient tolerated treatment well Patient left: in chair;with call bell/phone within reach (RN informed/aware of patient position) Nurse Communication: Mobility status         Time: [id number] PT Time Calculation (min) (ACUTE ONLY): 30 min   Charges:   PT Evaluation $PT Eval Moderate Complexity: 1 Procedure PT Treatments $Therapeutic Exercise: 8-22 mins   PT G Codes:       Ismahan Lippman H. Manson PasseyBrown, PT, DPT, NCS 03/28/16, 4:30 PM [phone]

## 2016-03-28 NOTE — Progress Notes (Signed)
Initial Nutrition Assessment  DOCUMENTATION CODES:   Severe malnutrition in context of acute illness/injury  INTERVENTION:  -Recommend Glucerna Shake po TID between meals, each supplement provides 220 kcal and 10 grams of protein -Encourage smaller, more frequent meals. Encouraged pt to utlilize the Glucerna shakes at home as a supplement to his meals and not as a meal replacement. Pt agreeable to this plan  NUTRITION DIAGNOSIS:   Malnutrition related to acute illness as evidenced by energy intake < or equal to 50% for > or equal to 5 days, moderate depletion of body fat, moderate depletions of muscle mass, percent weight loss.   GOAL:   Patient will meet greater than or equal to 90% of their needs  MONITOR:   PO intake, Supplement acceptance, Labs, Weight trends  REASON FOR ASSESSMENT:   Consult Assessment of nutrition requirement/status  ASSESSMENT:   65 yo male admitted with severe hypotension secondary to dehydration from poor by mouth intake and generalized weakness, uncontrolled DM, acute on CRF, ischemic CM EF 30-35%  Pt reports poor appetite since last admission (Nov 2017); eating only 1 meal per day (sometimes a meal would be just a Glucerna shake). Pt reports early satiety, food just gets larger and larger in his mouth and he can't seem to get it down. Appetite improved at present with pt eating 100% of breakfast this AM. Pt reports 10-15 pound wt loss over the past 1-2 months (6.6-9.5% wt loss). Pt with poor dentition but reports no problems chewing or swallowing.   Pt reports he did not take his insulin for 1 day prior to admission. Pt reports he has all his medications and I snormally adherent with taking them as prescribed and that his FSBS normally run in the 90s-100s.   Nutrition-Focused physical exam completed. Findings are mild/moderate  fat depletion, mild/moderate muscle depletion, and no edema.   Labs: FSBS 100s Meds: ss novolog  Diet Order:  Diet Heart  Room service appropriate? Yes; Fluid consistency: Thin  Skin:  Reviewed, no issues  Last BM:  12/18  Height:   Ht Readings from Last 1 Encounters:  03/27/16 5\' 9"  (1.753 m)    Weight:   Wt Readings from Last 1 Encounters:  03/27/16 143 lb 11.8 oz (65.2 kg)    Ideal Body Weight:     BMI:  Body mass index is 21.23 kg/m.  Estimated Nutritional Needs:   Kcal:     Protein:     Fluid:     EDUCATION NEEDS:   Education needs addressed  Romelle Starcherate Nathan Stallworth MS, RD, LDN [phone] Pager  [phone] Weekend/On-Call Pager

## 2016-03-28 NOTE — Progress Notes (Signed)
Dr. Clint GuyHower notified that patient has been off of levophed since this morning and bps are stable. Orders received to transfer off floor with no telemetry, med surg. Orders placed. Trudee KusterBrandi R Mansfield

## 2016-03-28 NOTE — Progress Notes (Signed)
Report given to [facility] Clinic[facility]anie, Charity fundraiserN. Patient transferring with no telemetry, CCMD and Elink notified. Patient updated on plan of care. Trudee KusterBrandi R Mansfield

## 2016-03-29 ENCOUNTER — Inpatient Hospital Stay: Payer: Commercial Managed Care - PPO

## 2016-03-29 LAB — C4 COMPLEMENT: Complement C4, Body Fluid: 37 mg/dL (ref 14–44)

## 2016-03-29 LAB — C3 COMPLEMENT: C3 Complement: 101 mg/dL (ref 82–167)

## 2016-03-29 LAB — BASIC METABOLIC PANEL
Anion gap: 5 (ref 5–15)
BUN: 47 mg/dL — ABNORMAL HIGH (ref 6–20)
CO2: 18 mmol/L — ABNORMAL LOW (ref 22–32)
Calcium: 8.4 mg/dL — ABNORMAL LOW (ref 8.9–10.3)
Chloride: 109 mmol/L (ref 101–111)
Creatinine, Ser: 1.61 mg/dL — ABNORMAL HIGH (ref 0.61–1.24)
GFR calc Af Amer: 51 mL/min — ABNORMAL LOW (ref >60)
GFR calc non Af Amer: 44 mL/min — ABNORMAL LOW (ref >60)
Glucose, Bld: 244 mg/dL — ABNORMAL HIGH (ref 65–99)
Potassium: 4.6 mmol/L (ref 3.5–5.1)
Sodium: 132 mmol/L — ABNORMAL LOW (ref 135–145)

## 2016-03-29 LAB — MPO/PR-3 (ANCA) ANTIBODIES
ANCA Proteinase 3: <3.5 U/mL (ref 0.0–3.5)
Myeloperoxidase Abs: <9 U/mL (ref 0.0–9.0)

## 2016-03-29 LAB — ANA W/REFLEX IF POSITIVE: Anti Nuclear Antibody(ANA): NEGATIVE

## 2016-03-29 LAB — GLUCOSE, CAPILLARY
GLUCOSE-CAPILLARY: 197 mg/dL — AB (ref 65–99)
GLUCOSE-CAPILLARY: 263 mg/dL — AB (ref 65–99)

## 2016-03-29 LAB — GLOMERULAR BASEMENT MEMBRANE ANTIBODIES: GBM Ab: 3 units (ref 0–20)

## 2016-03-29 LAB — PARATHYROID HORMONE, INTACT (NO CA): PTH: 50 pg/mL (ref 15–65)

## 2016-03-29 MED ORDER — GLUCERNA SHAKE PO LIQD: 237.0000 mL | Freq: Three times a day (TID) | ORAL | 0 refills | Status: AC

## 2016-03-29 MED ORDER — COLLAGENASE 250 UNIT/GM EX OINT
TOPICAL_OINTMENT | Freq: Every day | CUTANEOUS | Status: DC
  Administered 2016-03-29: 1 via TOPICAL
  Filled 2016-03-29: qty 30

## 2016-03-29 NOTE — Progress Notes (Signed)
Attempted to make follow-up appointment with Nephrology and kept getting their voicemail. Patient/family will make his follow-up appointment.

## 2016-03-29 NOTE — Progress Notes (Signed)
   [facility] [address] [place], [place] [address]  March 29, 2016  Patient:  Damon LikesJoel [name] Date of Birth: 07/07/1950 Date of Visit:  03/27/2016  To Whom it May Concern:  Please excuse Damon BornJoel L [name] from work from 03/27/2016 until 03/29/16 as he was admitted to the [facility] for medical treatment and has been receiving appropriate care. He may return to work on 03/31/16, sooner if he feels he is able to return sooner than this date.      Please don't hesitate to contact me with questions or concerns by calling  [phone] and asking them to page me directly.   Marge Duncansave Damon Mcglinn, MD

## 2016-03-29 NOTE — Progress Notes (Signed)
Inpatient Diabetes Program Recommendations  AACE/ADA: New Consensus Statement on Inpatient Glycemic Control (2015)  Target Ranges:  Prepandial:   less than 140 mg/dL      Peak postprandial:   less than 180 mg/dL (1-2 hours)      Critically ill patients:  140 - 180 mg/dL   Lab Results  Component Value Date   GLUCAP 263 (H) 03/29/2016   HGBA1C 8.3 (H) 01/16/2016    Review of Glycemic Control  Results for [name], Damon L (MRN [id number]) as of 03/29/2016 13:59  Ref. Range 03/28/2016 14:30 03/28/2016 16:33 03/28/2016 21:03 03/29/2016 07:38 03/29/2016 12:33  Glucose-Capillary Latest Ref Range: 65 - 99 mg/dL 409320 (H) 811299 (H) 914300 (H) 197 (H) 263 (H)     Inpatient Diabetes Program Recommendations:  Blood sugars remain elevated despite Lantus 30 units of insulin.  Spoke with patient by phone; he has been discharged and is ready to leave.  He has a meter at home and Lantus insulin in a vial (which he keeps in the fridge).  I reminded him that the insulin must be discarded every month and he is not to continue to use it until the bottle is empty.  I have asked him to check his blood sugar 2 times a day and to bring his blood sugars with him when he visits his doctor on December 29th.  Susette RacerJulie Jimeka Balan, RN, BA, MHA, CDE Diabetes Coordinator Inpatient Diabetes Program  [phone] (Team Pager) [phone] [facility] Office) 03/29/2016 2:07 PM

## 2016-03-29 NOTE — Discharge Summary (Addendum)
[facility] [place] at [facility]   PATIENT NAME: Damon LikesJoel [name]    MR#:  [id number]  DATE OF BIRTH:  [birth year]  DATE OF ADMISSION:  03/27/2016 ADMITTING PHYSICIAN: Enedina FinnerSona Patel, MD  DATE OF DISCHARGE: 03/29/16  PRIMARY CARE PHYSICIAN: Emogene MorganAYCOCK, [name], MD    ADMISSION DIAGNOSIS:  Hyponatremia [E87.1] Hypotension, unspecified hypotension type [I95.9] Altered mental status, unspecified altered mental status type [R41.82]  DISCHARGE DIAGNOSIS:  Active Problems:   Acute renal failure (ARF) (HCC)   Hypotension   Protein-calorie malnutrition, severe   SECONDARY DIAGNOSIS:   Past Medical History:  Diagnosis Date  . CKD (chronic kidney disease), stage III   . Diabetes mellitus without complication (HCC)   . Diverticulitis   . Essential hypertension   . MI (myocardial infarction)   . PAD (peripheral artery disease) (HCC)    a. 10/2015 s/p PTA and stenting of the RCIA, LCIA, and PTA of the LEIA Environmental manager(Schnier).  . Tobacco abuse     HOSPITAL COURSE:  Damon [name]  is a 65 y.o. male admitted 03/27/2016 with chief complaint Weakness . Please see H&P performed by Enedina FinnerSona Patel, MD for further information. Patient presented with the above symptoms, found to be hypotensive with acute renal failure. Received IV fluids with improvement of blood pressure and renal function. For hyperglycemia - on insulin drip on admission - transitioned back to basal/bolus insulin   DISCHARGE CONDITIONS:   stable  CONSULTS OBTAINED:  Treatment Team:  Munsoor Cherylann RatelLateef, MD  DRUG ALLERGIES:  No Known Allergies  DISCHARGE MEDICATIONS:   Current Discharge Medication List    START taking these medications   Details  feeding supplement, GLUCERNA SHAKE, (GLUCERNA SHAKE) LIQD Take 237 mLs by mouth 3 (three) times daily between meals. Qty: 21330 mL, Refills: 0      CONTINUE these medications which have NOT CHANGED   Details  ARIPiprazole (ABILIFY) 15 MG tablet Take 15 mg by mouth daily.      aspirin EC 81 MG tablet Take 1 tablet (81 mg total) by mouth daily. Qty: 150 tablet, Refills: 2    atorvastatin (LIPITOR) 80 MG tablet Take 1 tablet (80 mg total) by mouth daily at 6 PM. Qty: 30 tablet, Refills: 2    carvedilol (COREG) 25 MG tablet Take 1 tablet (25 mg total) by mouth 2 (two) times daily with a meal. Qty: 60 tablet, Refills: 2    furosemide (LASIX) 20 MG tablet Take 1 tablet (20 mg total) by mouth every other day. Qty: 30 tablet, Refills: 2    insulin glargine (LANTUS) 100 UNIT/ML injection Inject 30 Units into the skin every evening.     lisinopril (PRINIVIL,ZESTRIL) 20 MG tablet Take 1 tablet (20 mg total) by mouth daily. Qty: 30 tablet, Refills: 2    spironolactone (ALDACTONE) 25 MG tablet Take 1 tablet (25 mg total) by mouth daily. Qty: 30 tablet, Refills: 2    ticagrelor (BRILINTA) 90 MG TABS tablet Take 1 tablet (90 mg total) by mouth 2 (two) times daily. Qty: 60 tablet, Refills: 2    gabapentin (NEURONTIN) 300 MG capsule Take 300 mg by mouth 2 (two) times daily.         DISCHARGE INSTRUCTIONS:  wound consult note Reason for Consult: Consult requested for right foot wound.  Wound type: Full thickness wound to right outer foot, extends across anterior and plantar surfaces. Measurement: Entire wound is 7X3.5cm.  3.5X3.5X.1cm is tightly adhered dark brown eschar/slough, (anterior)  and 3.5X3.5cm is dry tightly adhered  eschar/rasied callous ( plantar)  Drainage (amount, consistency, odor) Small amt yellow drainage, no odor or fluctuance. Periwound: Intact skin surrounding Dressing procedure/placement/frequency: Santyl ointment for enzymatic debridement of nonviable tissue.   Recommend follow-up after discharge with podiatry or the outpatient wound care center for further debridement of nonviable tissue after it loosens. This can be arranged by the care manager if desired.  Discussed plan of care with patient and he verbalized understanding.   DIET:   Diabetic diet  DISCHARGE CONDITION:  stable  ACTIVITY:  Activity as tolerated  OXYGEN:  Home Oxygen: No.   Oxygen Delivery: room air  DISCHARGE LOCATION:  home   If you experience worsening of your admission symptoms, develop shortness of breath, life threatening emergency, suicidal or homicidal thoughts you must seek medical attention immediately by calling 911 or calling your MD immediately  if symptoms less severe.  You Must read complete instructions/literature along with all the possible adverse reactions/side effects for all the Medicines you take and that have been prescribed to you. Take any new Medicines after you have completely understood and accpet all the possible adverse reactions/side effects.   Please note  You were cared for by a hospitalist during your hospital stay. If you have any questions about your discharge medications or the care you received while you were in the hospital after you are discharged, you can call the unit and asked to speak with the hospitalist on call if the hospitalist that took care of you is not available. Once you are discharged, your primary care physician will handle any further medical issues. Please note that NO REFILLS for any discharge medications will be authorized once you are discharged, as it is imperative that you return to your primary care physician (or establish a relationship with a primary care physician if you do not have one) for your aftercare needs so that they can reassess your need for medications and monitor your lab values.    On the day of Discharge:   VITAL SIGNS:  Blood pressure (!) 124/59, pulse 73, temperature 98.6 F (37 C), temperature source Axillary, resp. rate 16, height 5\' 9"  (1.753 m), weight 65.2 kg (143 lb 11.8 oz), SpO2 99 %.  I/O:   Intake/Output Summary (Last 24 hours) at 03/29/16 1107 Last data filed at 03/29/16 1055  Gross per 24 hour  Intake           256.64 ml  Output             1700 ml   Net         -1443.36 ml    PHYSICAL EXAMINATION:  GENERAL:  65 y.o.-year-old patient lying in the bed with no acute distress.  EYES: Pupils equal, round, reactive to light and accommodation. No scleral icterus. Extraocular muscles intact.  HEENT: Head atraumatic, normocephalic. Oropharynx and nasopharynx clear.  NECK:  Supple, no jugular venous distention. No thyroid enlargement, no tenderness.  LUNGS: Normal breath sounds bilaterally, no wheezing, rales,rhonchi or crepitation. No use of accessory muscles of respiration.  CARDIOVASCULAR: S1, S2 normal. No murmurs, rubs, or gallops.  ABDOMEN: Soft, non-tender, non-distended. Bowel sounds present. No organomegaly or mass.  EXTREMITIES: No pedal edema, cyanosis, or clubbing.  NEUROLOGIC: Cranial nerves II through XII are intact. Muscle strength 5/5 in all extremities. Sensation intact. Gait not checked.  PSYCHIATRIC: The patient is alert and oriented x 3.  SKIN: No obvious rash, lesion, or ulcer.   DATA REVIEW:   CBC  Recent Labs Lab 03/27/16  2006  WBC 9.9  HGB 13.8  HCT 41.1  PLT 250    Chemistries   Recent Labs Lab 03/27/16 1519  03/29/16 1016  NA 126*  < > 132*  K 5.1  < > 4.6  CL 97*  < > 109  CO2 19*  < > 18*  GLUCOSE 523*  < > 244*  BUN 59*  < > 47*  CREATININE 3.12*  < > 1.61*  CALCIUM 8.8*  < > 8.4*  AST 21  --   --   ALT 25  --   --   ALKPHOS 134*  --   --   BILITOT 0.1*  --   --   < > = values in this interval not displayed.  Cardiac Enzymes  Recent Labs Lab 03/27/16 1519  TROPONINI 0.08*    Microbiology Results  Results for orders placed or performed during the hospital encounter of 03/27/16  MRSA PCR Screening     Status: None   Collection Time: 03/27/16  7:34 PM  Result Value Ref Range Status   MRSA by PCR NEGATIVE NEGATIVE Final    Comment:        The GeneXpert MRSA Assay (FDA approved for NASAL specimens only), is one component of a comprehensive MRSA colonization surveillance program.  It is not intended to diagnose MRSA infection nor to guide or monitor treatment for MRSA infections.     RADIOLOGY:  [place] Renal  Result Date: 03/29/2016 CLINICAL DATA:  Acute renal failure. EXAM: RENAL / URINARY TRACT ULTRASOUND COMPLETE COMPARISON:  06/06/2014 FINDINGS: Right Kidney: Length: 10.1 cm. Echogenicity within normal limits. No mass or hydronephrosis visualized. Left Kidney: Length: 10.2 cm. Echogenicity within normal limits. No hydronephrosis. There is an echogenic focus in the inferior left kidney. This measures up to 1.0 cm. Review of the prior abdominal CT confirms that this is a small angiomyolipoma. This structure roughly measured 0.6 cm on the previous CT. Bladder: Appears normal for degree of bladder distention. IMPRESSION: No acute abnormality.  No hydronephrosis. 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 There may have been some growth since 2016 and consider 1 year follow-up to ensure stability. Electronically Signed   By: Richarda OverlieAdam  Henn M.D.   On: 03/29/2016 09:22   Dg Chest Portable 1 View  Result Date: 03/27/2016 CLINICAL DATA:  Weakness and shortness of breath since yesterday. MI in November 2017. The patient has undergone coronary stent placement. Current smoker. EXAM: PORTABLE CHEST 1 VIEW COMPARISON:  Chest x-ray of February 14, 2016 FINDINGS: The lungs remain hyperinflated. There is no focal infiltrate. There is no pneumothorax, pneumomediastinum, or pleural effusion. The heart and pulmonary vascularity are normal. There is calcification in the wall of the aortic arch. The observed bony thorax is normal. IMPRESSION: Hyperinflation consistent with COPD or reactive airway disease. There is no pneumonia, CHF, nor other acute cardiopulmonary abnormality. Thoracic aortic atherosclerosis. Electronically Signed   By: Jazilyn Siegenthaler  [place] M.D.   On: 03/27/2016 15:44     Management plans discussed with the patient, family and they are in agreement.  CODE STATUS:     Code Status Orders         Start     Ordered   03/27/16 1935  Full code  Continuous     03/27/16 1934    Code Status History    Date Active Date Inactive Code Status Order ID Comments User Context   02/14/2016  6:10 PM 02/15/2016  5:03 PM Full Code [id number]  Alford Highlandichard Wieting, MD  ED   01/13/2016  9:42 AM 01/17/2016 11:47 PM Full Code [id number]  Eugenie Norrie, NP ED      TOTAL TIME TAKING CARE OF THIS PATIENT: 33 minutes.    Britteney Ayotte,  Mardi Mainland.D on 03/29/2016 at 11:07 AM  Between 7am to 6pm - Pager - [phone]  After 6pm go to [email or web address] - Social research officer, government  [facility] [place] [facility]  Office  [phone]  CC: Primary care physician; Emogene Morgan, MD

## 2016-03-29 NOTE — Consult Note (Addendum)
WOC Nurse wound consult note Reason for Consult: Consult requested for right foot wound.  Pt states he developed a blister recently and the location has declined. Wound type: Full thickness wound to right outer foot, extends across anterior and plantar surfaces. Measurement: Entire wound is 7X3.5cm.  3.5X3.5X.1cm is tightly adhered dark brown eschar/slough, (anterior)  and 3.5X3.5cm is dry tightly adhered eschar/rasied callous ( plantar)  Drainage (amount, consistency, odor) Small amt yellow drainage, no odor or fluctuance. Periwound: Intact skin surrounding Dressing procedure/placement/frequency: Santyl ointment for enzymatic debridement of nonviable tissue.  Recommend follow-up after discharge with podiatry or the outpatient wound care center for further debridement of nonviable tissue after it loosens. This can be arranged by the care manager if desired.  Discussed plan of care with patient and he verbalized understanding. Please re-consult if further assistance is needed.  Thank-you,  Cammie Mcgeeawn Antoine Fiallos MSN, RN, CWOCN, [place]AP, CNS [phone]

## 2016-03-29 NOTE — Care Management Note (Signed)
Case Management Note  Patient Details  Name: Damon [name] MRN: [id number] Date of Birth: 09/04/1950  Subjective/Objective:  Discussed case with attending. Requested home health SN and PT orders with wound care orders. Patient updated. Patient has a walker.                   Action/Plan: Advanced notified of discharge with SN and PT.   Expected Discharge Date:   03/29/2016               Expected Discharge Plan:  Home w Home Health Services  In-House Referral:     Discharge planning Services  CM Consult  Post Acute Care Choice:  Home Health Choice offered to:  Patient  DME Arranged:    DME Agency:     HH Arranged:  RN, PT HH Agency:  [facility]  Status of Service:  Completed, signed off  If discussed at Long Length of Stay Meetings, dates discussed:    Additional Comments:  Marily MemosLisa M Merica Prell, RN 03/29/2016, 11:33 AM

## 2016-03-29 NOTE — Progress Notes (Signed)
Patient discharge per MD order. Discharge instructions reviewed with patient and his wife. PIV x 2 removed, pressure bandage applied. Telemetry monitoring discontinued. Patient discharge in wheelchair with RN to home with wife.

## 2016-03-30 LAB — PROTEIN ELECTROPHORESIS, SERUM
A/G Ratio: 1.1 (ref 0.7–1.7)
ALPHA-2-GLOBULIN: 0.9 g/dL (ref 0.4–1.0)
Albumin ELP: 2.9 g/dL (ref 2.9–4.4)
Alpha-1-Globulin: 0.1 g/dL (ref 0.0–0.4)
BETA GLOBULIN: 0.8 g/dL (ref 0.7–1.3)
GAMMA GLOBULIN: 0.9 g/dL (ref 0.4–1.8)
Globulin, Total: 2.7 g/dL (ref 2.2–3.9)
Total Protein ELP: 5.6 g/dL — ABNORMAL LOW (ref 6.0–8.5)

## 2016-04-03 ENCOUNTER — Encounter: Payer: Self-pay | Admitting: Cardiovascular Disease

## 2016-04-03 ENCOUNTER — Ambulatory Visit (INDEPENDENT_AMBULATORY_CARE_PROVIDER_SITE_OTHER): Payer: Commercial Managed Care - PPO | Admitting: Cardiovascular Disease

## 2016-04-03 VITALS — BP 94/60 | HR 76 | Ht 69.0 in | Wt 148.5 lb

## 2016-04-03 DIAGNOSIS — I2510 Atherosclerotic heart disease of native coronary artery without angina pectoris: Secondary | ICD-10-CM

## 2016-04-03 DIAGNOSIS — I5022 Chronic systolic (congestive) heart failure: Secondary | ICD-10-CM | POA: Diagnosis not present

## 2016-04-03 DIAGNOSIS — I739 Peripheral vascular disease, unspecified: Secondary | ICD-10-CM

## 2016-04-03 DIAGNOSIS — Z720 Tobacco use: Secondary | ICD-10-CM | POA: Diagnosis not present

## 2016-04-03 MED ORDER — CARVEDILOL 12.5 MG PO TABS: 12.5000 mg | ORAL_TABLET | Freq: Two times a day (BID) | ORAL | 3 refills | Status: DC

## 2016-04-03 NOTE — Patient Instructions (Signed)
Medication Instructions:  Your physician has recommended you make the following change in your medication:  STOP taking lasix DECREASE coreg to 12.5 mg twice daily   Labwork: none  Testing/Procedures: none  Follow-Up: Your physician recommends that you schedule a follow-up appointment in: one month with Dr. Kirke CorinArida.    Any Other Special Instructions Will Be Listed Below (If Applicable). Please follow up with your podiatrist Please follow up with Dr. Gilda CreaseSchnier at [facility],  [address], [place], [phone]      If you need a refill on your cardiac medications before your next appointment, please call your pharmacy.

## 2016-04-03 NOTE — Progress Notes (Signed)
Cardiology Office Note   Date:  04/03/2016   ID:  Damon BornJoel L Kling, DOB Apr 05, 1951, MRN [id number]  PCP:  Emogene MorganAYCOCK, [name], MD  Cardiologist:   Lorine BearsMuhammad Arida, MD   Chief Complaint  Patient presents with  . OTHER    2 month f/u c/o foot issue. Meds reviewed verbally with pt.      History of Present Illness: Damon [name] is a 65 y.o. male who presents for A follow-up visit regarding coronary artery disease and chronic systolic heart failure due to ischemic cardiomyopathy. He presented in October with subacute anterior wall MI in the setting of a new LBBB and progressive dyspnea requiring intubation. Emergent cardiac catheterization showed occluded LAD which was stented. He underwent staged PCI of a large OM branch. The right coronary artery was medium in size and codominant. There was significant disease in the midsegment that was left to be treated medically. Ejection fraction was 25-30%. He was discharged home with a LifeVest. Also with history of PAD s/p stenting, CKD stage III, DM, HTN, and tobacco abuse.   He had 2 hospitalizations since his initial cardiac event. The first was due to shortness of breath in the setting of excessive heat. The second hospitalization was recent and due to acute on chronic renal failure and hyponatremia. He improved with hydration. He complains of poor appetite and lost 12 pounds since October. Unfortunately, he continues to smoke half a pack per day. He developed a blister on his right foot and has been having difficulty walking due to that. He denies any chest pain. He reports stable exertional dyspnea.  Past Medical History:  Diagnosis Date  . CKD (chronic kidney disease), stage III   . Diabetes mellitus without complication (HCC)   . Diverticulitis   . Essential hypertension   . MI (myocardial infarction)   . PAD (peripheral artery disease) (HCC)    a. 10/2015 s/p PTA and stenting of the RCIA, LCIA, and PTA of the LE[employer] Environmental manager(Schnier).  . Tobacco abuse       Past Surgical History:  Procedure Laterality Date  . CARDIAC CATHETERIZATION N/A 01/13/2016   Procedure: LEFT HEART CATH AND CORONARY ANGIOGRAPHY;  Surgeon: Iran OuchMuhammad A Arida, MD;  Location: [facility] INVASIVE CV LAB;  Service: Cardiovascular;  Laterality: N/A;  . CARDIAC CATHETERIZATION N/A 01/13/2016   Procedure: Coronary Stent Intervention;  Surgeon: Iran OuchMuhammad A Arida, MD;  Location: [facility]IVE CV [facility];  Service: Cardiovascular;  Laterality: N/A;  . CARDIAC CATHETERIZATION N/A 01/16/2016   Procedure: Left Heart Cath and Coronary Angiography;  Surgeon: Iran OuchMuhammad A Arida, MD;  Location: [facility] CV [facility];  Service: Cardiovascular;  Laterality: N/A;  . NECK SURGERY    . PERIPHERAL VASCULAR CATHETERIZATION Left 11/01/2015   Procedure: Lower Extremity Angiography;  Surgeon: Renford DillsGregory G Schnier, MD;  Location: [facility] INVASIVE CV LAB;  Service: Cardiovascular;  Laterality: Left;     Current Outpatient Prescriptions  Medication Sig Dispense Refill  . ARIPiprazole (ABILIFY) 15 MG tablet Take 15 mg by mouth daily.    Marland Kitchen. aspirin EC 81 MG tablet Take 1 tablet (81 mg total) by mouth daily. 150 tablet 2  . atorvastatin (LIPITOR) 80 MG tablet Take 1 tablet (80 mg total) by mouth daily at 6 PM. 30 tablet 2  . carvedilol (COREG) 25 MG tablet Take 1 tablet (25 mg total) by mouth 2 (two) times daily with a meal. 60 tablet 2  . feeding supplement, GLUCERNA SHAKE, (GLUCERNA SHAKE) LIQD Take 237 mLs by mouth 3 (  three) times daily between meals. 21330 mL 0  . furosemide (LASIX) 20 MG tablet Take 1 tablet (20 mg total) by mouth every other day. 30 tablet 2  . gabapentin (NEURONTIN) 300 MG capsule Take 300 mg by mouth 2 (two) times daily.    . insulin glargine (LANTUS) 100 UNIT/ML injection Inject 30 Units into the skin every evening.     Marland Kitchen. lisinopril (PRINIVIL,ZESTRIL) 20 MG tablet Take 1 tablet (20 mg total) by mouth daily. 30 tablet 2  . spironolactone (ALDACTONE) 25 MG tablet Take 1 tablet (25 mg total) by mouth  daily. 30 tablet 2  . ticagrelor (BRILINTA) 90 MG TABS tablet Take 1 tablet (90 mg total) by mouth 2 (two) times daily. 60 tablet 2   No current facility-administered medications for this visit.     Allergies:   Patient has no known allergies.    Social History:  The patient  reports that he has been smoking Cigarettes.  He has a 7.50 pack-year smoking history. He uses smokeless tobacco. He reports that he does not drink alcohol or use drugs.   Family History:  The patient's family history includes Cancer in his mother; Diabetes in his father and mother; Intracerebral hemorrhage in his mother.    ROS:  Please see the history of present illness.   Otherwise, review of systems are positive for none.   All other systems are reviewed and negative.    PHYSICAL EXAM: VS:  BP 94/60 (BP Location: Left Arm, Patient Position: Sitting, Cuff Size: Normal)   Pulse 76   Ht 5\' 9"  (1.753 m)   Wt 148 lb 8 oz (67.4 kg)   BMI 21.93 kg/m  , BMI Body mass index is 21.93 kg/m. GEN: Well nourished, well developed, in no acute distress  HEENT: normal  Neck: no JVD, carotid bruits, or masses Cardiac: RRR; no murmurs, rubs, or gallops,no edema  Respiratory:  clear to auscultation bilaterally, normal work of breathing GI: soft, nontender, nondistended, + BS MS: no deformity or atrophy  Skin: warm and dry, no rash Neuro:  Strength and sensation are intact Psych: euthymic mood, full affect   EKG:  EKG is not ordered today.    Recent Labs: 01/15/2016: Magnesium 2.0 03/27/2016: ALT 25; B Natriuretic Peptide 503.0; Hemoglobin 13.8; Platelets 250 03/29/2016: BUN 47; Creatinine, Ser 1.61; Potassium 4.6; Sodium 132    Lipid Panel    Component Value Date/Time   CHOL 94 02/15/2016 0552   TRIG 64 02/15/2016 0552   HDL 23 (L) 02/15/2016 0552   CHOLHDL 4.1 02/15/2016 0552   VLDL 13 02/15/2016 0552   LDLCALC 58 02/15/2016 0552      Wt Readings from Last 3 Encounters:  04/03/16 148 lb 8 oz (67.4 kg)   03/27/16 143 lb 11.8 oz (65.2 kg)  02/14/16 148 lb 14.4 oz (67.5 kg)       No flowsheet data found.    ASSESSMENT AND PLAN:  1.  Coronary artery disease involving native coronary arteries without angina: Currently with no anginal symptoms. Continue dual antiplatelet therapy with aspirin and Brilinta.  2. Chronic systolic heart failure: He was recently discharged due to acute on chronic renal failure with hyponatremia due to volume depletion. He has been having poor oral intake. I elected to discontinue furosemide altogether.   his blood pressure has been running low and I elected to decrease the dose of carvedilol to 12.5 mg twice daily.    3. Ulceration on the right foot with known history  of peripheral arterial disease. He is going to make a follow-up appointment with his podiatrist. I also advised him to follow-up with Dr. Gilda Crease regarding the possibility of peripheral arterial disease contributing to this. He is known to have bilateral SFA occlusion and had previous iliac artery stenting.  4. Tobacco use: I had a prolonged discussion with him about the importance of smoking cessation.  The patient was supposed to go back to work this week. However, he is having multiple medical problems especially with the ulceration on his right foot. He is not able to go back to work and I extended his return to work for at least another month until reevaluation.    Disposition:   FU with me in 1 month  Signed,  Lorine Bears, MD  04/03/2016 12:02 PM    [facility]

## 2016-04-17 ENCOUNTER — Telehealth: Payer: Self-pay | Admitting: Cardiovascular Disease

## 2016-04-17 NOTE — Telephone Encounter (Signed)
Received records request [facility] [employer] ,requesting additional information, [facility].

## 2016-04-18 ENCOUNTER — Ambulatory Visit (INDEPENDENT_AMBULATORY_CARE_PROVIDER_SITE_OTHER): Payer: Commercial Managed Care - PPO | Admitting: Vascular Surgery

## 2016-04-23 ENCOUNTER — Ambulatory Visit (INDEPENDENT_AMBULATORY_CARE_PROVIDER_SITE_OTHER): Payer: Commercial Managed Care - PPO | Admitting: Vascular Surgery

## 2016-04-24 ENCOUNTER — Ambulatory Visit (INDEPENDENT_AMBULATORY_CARE_PROVIDER_SITE_OTHER): Payer: Commercial Managed Care - PPO | Admitting: Vascular Surgery

## 2016-04-27 ENCOUNTER — Emergency Department: Payer: Commercial Managed Care - PPO

## 2016-04-27 ENCOUNTER — Encounter: Payer: Self-pay | Admitting: Emergency Medicine

## 2016-04-27 ENCOUNTER — Emergency Department
Admission: EM | Admit: 2016-04-27 | Discharge: 2016-04-27 | Disposition: A | Discharge status: Home / Self Care | Payer: Commercial Managed Care - PPO | Attending: Emergency Medicine | Admitting: Emergency Medicine

## 2016-04-27 DIAGNOSIS — F17210 Nicotine dependence, cigarettes, uncomplicated: Secondary | ICD-10-CM | POA: Insufficient documentation

## 2016-04-27 DIAGNOSIS — Z794 Long term (current) use of insulin: Secondary | ICD-10-CM | POA: Insufficient documentation

## 2016-04-27 DIAGNOSIS — E1022 Type 1 diabetes mellitus with diabetic chronic kidney disease: Secondary | ICD-10-CM | POA: Diagnosis not present

## 2016-04-27 DIAGNOSIS — R4182 Altered mental status, unspecified: Secondary | ICD-10-CM | POA: Diagnosis not present

## 2016-04-27 DIAGNOSIS — Z7982 Long term (current) use of aspirin: Secondary | ICD-10-CM | POA: Insufficient documentation

## 2016-04-27 DIAGNOSIS — N183 Chronic kidney disease, stage 3 (moderate): Secondary | ICD-10-CM | POA: Diagnosis not present

## 2016-04-27 DIAGNOSIS — E10649 Type 1 diabetes mellitus with hypoglycemia without coma: Secondary | ICD-10-CM

## 2016-04-27 DIAGNOSIS — Z79899 Other long term (current) drug therapy: Secondary | ICD-10-CM | POA: Diagnosis not present

## 2016-04-27 DIAGNOSIS — I129 Hypertensive chronic kidney disease with stage 1 through stage 4 chronic kidney disease, or unspecified chronic kidney disease: Secondary | ICD-10-CM | POA: Insufficient documentation

## 2016-04-27 LAB — GLUCOSE, CAPILLARY
GLUCOSE-CAPILLARY: 103 mg/dL — AB (ref 65–99)
GLUCOSE-CAPILLARY: 135 mg/dL — AB (ref 65–99)
GLUCOSE-CAPILLARY: 92 mg/dL (ref 65–99)
Glucose-Capillary: 123 mg/dL — ABNORMAL HIGH (ref 65–99)
Glucose-Capillary: 72 mg/dL (ref 65–99)
Glucose-Capillary: 75 mg/dL (ref 65–99)

## 2016-04-27 LAB — CBC WITH DIFFERENTIAL/PLATELET
BASOS ABS: 0.1 10*3/uL (ref 0–0.1)
Basophils Relative: 0 %
EOS ABS: 0 10*3/uL (ref 0–0.7)
EOS PCT: 0 %
HCT: 37.4 % — ABNORMAL LOW (ref 40.0–52.0)
Hemoglobin: 12.4 g/dL — ABNORMAL LOW (ref 13.0–18.0)
LYMPHS PCT: 6 %
Lymphs Abs: 1 10*3/uL (ref 1.0–3.6)
MCH: 31.4 pg (ref 26.0–34.0)
MCHC: 33.1 g/dL (ref 32.0–36.0)
MCV: 94.8 fL (ref 80.0–100.0)
MONO ABS: 0.8 10*3/uL (ref 0.2–1.0)
Monocytes Relative: 5 %
Neutro Abs: 13.9 10*3/uL — ABNORMAL HIGH (ref 1.4–6.5)
Neutrophils Relative %: 89 %
PLATELETS: 354 10*3/uL (ref 150–440)
RBC: 3.95 MIL/uL — AB (ref 4.40–5.90)
RDW: 15.3 % — AB (ref 11.5–14.5)
WBC: 15.8 10*3/uL — ABNORMAL HIGH (ref 3.8–10.6)

## 2016-04-27 LAB — URINALYSIS, COMPLETE (UACMP) WITH MICROSCOPIC
BACTERIA UA: NONE SEEN
BILIRUBIN URINE: NEGATIVE
GLUCOSE, UA: 50 mg/dL — AB
HGB URINE DIPSTICK: NEGATIVE
KETONES UR: NEGATIVE mg/dL
LEUKOCYTES UA: NEGATIVE
NITRITE: NEGATIVE
PH: 5 (ref 5.0–8.0)
PROTEIN: 30 mg/dL — AB
Specific Gravity, Urine: 1.021 (ref 1.005–1.030)

## 2016-04-27 LAB — COMPREHENSIVE METABOLIC PANEL
ALT: 65 U/L — ABNORMAL HIGH (ref 17–63)
AST: 46 U/L — ABNORMAL HIGH (ref 15–41)
Albumin: 3.1 g/dL — ABNORMAL LOW (ref 3.5–5.0)
Alkaline Phosphatase: 104 U/L (ref 38–126)
Anion gap: 7 (ref 5–15)
BUN: 38 mg/dL — ABNORMAL HIGH (ref 6–20)
CHLORIDE: 111 mmol/L (ref 101–111)
CO2: 21 mmol/L — AB (ref 22–32)
Calcium: 8.9 mg/dL (ref 8.9–10.3)
Creatinine, Ser: 1.65 mg/dL — ABNORMAL HIGH (ref 0.61–1.24)
GFR, EST AFRICAN AMERICAN: 49 mL/min — AB (ref >60)
GFR, EST NON AFRICAN AMERICAN: 43 mL/min — AB (ref >60)
Glucose, Bld: 153 mg/dL — ABNORMAL HIGH (ref 65–99)
POTASSIUM: 5.5 mmol/L — AB (ref 3.5–5.1)
SODIUM: 139 mmol/L (ref 135–145)
Total Bilirubin: 0.5 mg/dL (ref 0.3–1.2)
Total Protein: 7.2 g/dL (ref 6.5–8.1)

## 2016-04-27 LAB — AMMONIA: AMMONIA: 18 umol/L (ref 9–35)

## 2016-04-27 LAB — TROPONIN I: Troponin I: 0.06 ng/mL (ref <0.03)

## 2016-04-27 MED ORDER — SODIUM POLYSTYRENE SULFONATE 15 GM/60ML PO SUSP
30.0000 g | Freq: Once | ORAL | Status: AC
  Administered 2016-04-27: 30 g via ORAL
  Filled 2016-04-27: qty 120

## 2016-04-27 MED ORDER — SODIUM CHLORIDE 0.9 % IV BOLUS (SEPSIS)
500.0000 mL | Freq: Once | INTRAVENOUS | Status: AC
  Administered 2016-04-27: 500 mL via INTRAVENOUS

## 2016-04-27 MED ORDER — SODIUM CHLORIDE 0.9 % IV SOLN
1.0000 g | Freq: Once | INTRAVENOUS | Status: AC
  Administered 2016-04-27: 1 g via INTRAVENOUS
  Filled 2016-04-27: qty 10

## 2016-04-27 MED ORDER — SODIUM POLYSTYRENE SULFONATE 15 GM/60ML PO SUSP: 15.0000 g | Freq: Two times a day (BID) | ORAL | 0 refills | Status: AC

## 2016-04-27 NOTE — ED Notes (Signed)
Pharmacy called to IV Calcium Gluconate infusion. They will mix and send.

## 2016-04-27 NOTE — ED Notes (Signed)
Per dr Fanny Bienquale, no need to continue 30 min CBG.

## 2016-04-27 NOTE — ED Notes (Addendum)
Pts cbg 92 after second cup of orange juice (12 oz). MD Fanny BienQuale and RN Inetta Fermoina notified.

## 2016-04-27 NOTE — ED Notes (Signed)
Received report from Damon [name], care assumed.  Pt resting in bed on cm, drinking OJ

## 2016-04-27 NOTE — Progress Notes (Signed)
Patient known to our practice. Called by ED for hyperkalemia of 5.5. Patient is asked to stop taking spironolactone and lisinopril. He has an appointment scheduled with our office: 05/02/16 at 11am. [facility].   Damon DowdyKOLLURU, Antwian Santaana

## 2016-04-27 NOTE — ED Provider Notes (Signed)
[facility] Emergency Department Provider Note   ____________________________________________   First MD Initiated Contact with Patient 04/27/16 [phone]     (approximate)  I have reviewed the triage vital signs and the nursing notes.   HISTORY  Chief Complaint Hypoglycemia    HPI Damon [name] is a 66 y.o. male here for evaluation as he was found sweaty and confused this morning by family.  Patient reports he has not remember the incident, but EMS reports that the patient was noted to have a low blood sugar given an amp of dextrose with good improvement. The patient reports he is a type I diabetic, and that his home health nurse advised that he increase his insulin to 40 units daily recently from his typical 30 units as his blood sugars have been running elevated.  Patient reports he thinks his blood sugar and also gone low. He denies any other concerns. No headache no nausea no vomiting. He felt slightly lightheaded and a little woozy but this is improving.  No chest pain or shortness of breath. No fevers chills or recent illness.   Past Medical History:  Diagnosis Date  . CKD (chronic kidney disease), stage III   . Diabetes mellitus without complication (HCC)   . Diverticulitis   . Essential hypertension   . MI (myocardial infarction)   . PAD (peripheral artery disease) (HCC)    a. 10/2015 s/p PTA and stenting of the RCIA, LCIA, and PTA of the [employer] Environmental manager(Schnier).  . Tobacco abuse     Patient Active Problem List   Diagnosis Date Noted  . Protein-calorie malnutrition, severe 03/28/2016  . Acute renal failure (ARF) (HCC) 03/27/2016  . Hypotension 03/27/2016  . SOB (shortness of breath) 02/15/2016  . CKD (chronic kidney disease), stage II 02/15/2016  . CAD (coronary artery disease) 01/17/2016  . Essential hypertension 01/17/2016  . HLD (hyperlipidemia) 01/17/2016  . ST elevation myocardial infarction involving left anterior descending (LAD) coronary  artery (HCC)   . Cardiomyopathy, ischemic   . Acute respiratory failure (HCC) 01/13/2016  . NSTEMI (non-ST elevated myocardial infarction) [facility](HCC)     Past Surgical History:  Procedure Laterality Date  . CARDIAC CATHETERIZATION N/A 01/13/2016   Procedure: LEFT HEART CATH AND CORONARY ANGIOGRAPHY;  Surgeon: Iran OuchMuhammad A Arida, MD;  Location: [facility] INVASIVE CV LAB;  Service: Cardiovascular;  Laterality: N/A;  . CARDIAC CATHETERIZATION N/A 01/13/2016   Procedure: Coronary Stent Intervention;  Surgeon: Iran OuchMuhammad A Arida, MD;  Location: [facility] INV[facility] CV [facility];  Service: Cardiovascular;  Laterality: N/A;  . CARDIAC CATHETERIZATION N/A 01/16/2016   Procedure: Left Heart Cath and Coronary Angiography;  Surgeon: Iran OuchMuhammad A Arida, MD;  Location: [facility] INV[facility] CV LAB;  Service: Cardiovascular;  Laterality: N/A;  . NECK SURGERY    . PERIPHERAL VASCULAR CATHETERIZATION Left 11/01/2015   Procedure: Lower Extremity Angiography;  Surgeon: Renford DillsGregory G Schnier, MD;  Location: [facility] INVASIVE CV LAB;  Service: Cardiovascular;  Laterality: Left;    Prior to Admission medications   Medication Sig Start Date End Date Taking? Authorizing Provider  ARIPiprazole (ABILIFY) 15 MG tablet Take 15 mg by mouth daily.   Yes Historical Provider, MD  aspirin EC 81 MG tablet Take 1 tablet (81 mg total) by mouth daily. 02/07/16  Yes Ryan M Dunn, PA-C  atorvastatin (LIPITOR) 80 MG tablet Take 1 tablet (80 mg total) by mouth daily at 6 PM. 01/17/16  Yes Enid Baasadhika [name], MD  carvedilol (COREG) 12.5 MG tablet Take 1 tablet (12.5 mg total) by  mouth 2 (two) times daily with a meal. 04/03/16  Yes Iran Ouch, MD  gabapentin (NEURONTIN) 300 MG capsule Take 300 mg by mouth daily as needed.    Yes Historical Provider, MD  insulin glargine (LANTUS) 100 UNIT/ML injection Inject 30 Units into the skin every evening.    Yes Historical Provider, MD  ticagrelor (BRILINTA) 90 MG TABS tablet Take 1 tablet (90 mg total) by mouth 2 (two) times daily.  01/17/16  Yes Enid Baas, MD  feeding supplement, GLUCERNA SHAKE, (GLUCERNA SHAKE) LIQD Take 237 mLs by mouth 3 (three) times daily between meals. 03/29/16 04/28/16  Wyatt Haste, MD  sodium polystyrene (KAYEXALATE) 15 GM/60ML suspension Take 60 mLs (15 g total) by mouth 2 (two) times daily. 04/27/16 05/02/16  Sharyn Creamer, MD    Allergies Patient has no known allergies.  Family History  Problem Relation Age of Onset  . Intracerebral hemorrhage Mother   . Diabetes Mother   . Cancer Mother   . Diabetes Father     Social History Social History  Substance Use Topics  . Smoking status: Current Every Day Smoker    Packs/day: 0.50    Years: 15.00    Types: Cigarettes  . Smokeless tobacco: Current User  . Alcohol use No    Review of Systems Constitutional: No fever/chills Eyes: No visual changes. ENT: No sore throat. Cardiovascular: Denies chest pain. Respiratory: Denies shortness of breath. Gastrointestinal: No abdominal pain.   No constipation. Genitourinary: Negative for dysuria. Musculoskeletal: Negative for back pain. Skin: Negative for rash. Neurological: Negative for headaches, focal weakness or numbness.  10-point ROS otherwise negative.  ____________________________________________   PHYSICAL EXAM:  VITAL SIGNS: ED Triage Vitals  Enc Vitals Group     BP 04/27/16 0836 117/65     Pulse Rate 04/27/16 0836 91     Resp 04/27/16 0836 16     Temp 04/27/16 0836 97.5 F (36.4 C)     Temp Source 04/27/16 0836 Oral     SpO2 04/27/16 0836 97 %     Weight 04/27/16 0840 160 lb (72.6 kg)     Height 04/27/16 0840 5\' 8"  (1.727 m)     Head Circumference --      Peak Flow --      Pain Score --      Pain Loc --      Pain Edu? --      Excl. in GC? --     Constitutional: Alert and oriented. Well appearing and in no acute distress. Eyes: Conjunctivae are normal. PERRL. EOMI. Head: Atraumatic. Nose: No congestion/rhinnorhea. Mouth/Throat: Mucous membranes are moist.   Oropharynx non-erythematous. Neck: No stridor.   Cardiovascular: Normal rate, regular rhythm. Grossly normal heart sounds.  Good peripheral circulation. Respiratory: Normal respiratory effort.  No retractions. Lungs CTAB. Gastrointestinal: Soft and nontender. No distention.  Musculoskeletal: No lower extremity tenderness nor edema.  No joint effusions. Neurologic:  Normal speech and language. No gross focal neurologic deficits are appreciated. Skin:  Skin is warm, dry and intact. No rash noted. Psychiatric: Mood and affect are normal. Speech and behavior are normal.  ____________________________________________   LABS (all labs ordered are listed, but only abnormal results are displayed)  Labs Reviewed  CBC WITH DIFFERENTIAL/PLATELET - Abnormal; Notable for the following:       Result Value   WBC 15.8 (*)    RBC 3.95 (*)    Hemoglobin 12.4 (*)    HCT 37.4 (*)    RDW 15.3 (*)  Neutro Abs 13.9 (*)    All other components within normal limits  COMPREHENSIVE METABOLIC PANEL - Abnormal; Notable for the following:    Potassium 5.5 (*)    CO2 21 (*)    Glucose, Bld 153 (*)    BUN 38 (*)    Creatinine, Ser 1.65 (*)    Albumin 3.1 (*)    AST 46 (*)    ALT 65 (*)    GFR calc non Af Amer 43 (*)    GFR calc Af Amer 49 (*)    All other components within normal limits  URINALYSIS, COMPLETE (UACMP) WITH MICROSCOPIC - Abnormal; Notable for the following:    Color, Urine YELLOW (*)    APPearance CLEAR (*)    Glucose, UA 50 (*)    Protein, ur 30 (*)    Squamous Epithelial / LPF 0-5 (*)    All other components within normal limits  TROPONIN I - Abnormal; Notable for the following:    Troponin I 0.06 (*)    All other components within normal limits  GLUCOSE, CAPILLARY - Abnormal; Notable for the following:    Glucose-Capillary 123 (*)    All other components within normal limits  GLUCOSE, CAPILLARY - Abnormal; Notable for the following:    Glucose-Capillary 103 (*)    All other  components within normal limits  GLUCOSE, CAPILLARY - Abnormal; Notable for the following:    Glucose-Capillary 135 (*)    All other components within normal limits  AMMONIA  GLUCOSE, CAPILLARY  GLUCOSE, CAPILLARY  GLUCOSE, CAPILLARY  CBG MONITORING, ED   ____________________________________________  EKG  Reviewed and interpreted by me at 10:30 AM Heart rate 90 Care is 110 QTc 500 Normal sinus rhythm, probable left ventricular hypertrophy with repolarization abnormality No evidence of acute ischemic changes compared with previous ____________________________________________  RADIOLOGY  Ct Head Wo Contrast  Result Date: 04/27/2016 CLINICAL DATA:  Altered mental status/ confusion.  Hypoglycemia. EXAM: CT HEAD WITHOUT CONTRAST TECHNIQUE: Contiguous axial images were obtained from the base of the skull through the vertex without intravenous contrast. COMPARISON:  January 13, 2016 FINDINGS: Brain: There is mild diffuse atrophy. There is no intracranial mass, hemorrhage, extra-axial fluid collection, or midline shift. There is slight small vessel disease in the centra semiovale bilaterally. Elsewhere gray-white compartments appear normal. No evident acute infarct. Vascular: No hyperdense vessel. There are scattered foci of calcification in the carotid siphon regions. Skull: Bony calvarium appears intact. Sinuses/Orbits: Air-fluid level in the visualized left maxillary antrum. There is mucosal thickening in several ethmoid air cells. Other visualized paranasal sinuses are clear. Visualized orbits appear symmetric bilaterally. Other: Mastoid air cells are clear. IMPRESSION: Mild atrophy with rather minimal periventricular small vessel disease. No intracranial mass, hemorrhage, or extra-axial fluid collection. No acute appearing infarct. Scattered foci of carotid artery calcification. Air-fluid level noted in visualize left maxillary antrum consistent with a degree of acute sinusitis. Mucosal  thickening noted in several ethmoid air cells bilaterally. Electronically Signed   By: Bretta Bang III M.D.   On: 04/27/2016 09:35   Dg Chest Portable 1 View  Result Date: 04/27/2016 CLINICAL DATA:  Smoker, confusion EXAM: PORTABLE CHEST 1 VIEW COMPARISON:  03/27/2016 FINDINGS: Cardiomediastinal silhouette is stable. No infiltrate or pleural effusion. No pulmonary edema. Mild degenerative changes bilateral acromioclavicular joints. Partially visualized metallic fixation plate cervical spine. IMPRESSION: No active disease. Electronically Signed   By: Natasha Mead M.D.   On: 04/27/2016 10:28    ____________________________________________   PROCEDURES  Procedure(s)  performed: None  Procedures  Critical Care performed: No  ____________________________________________   INITIAL IMPRESSION / ASSESSMENT AND PLAN / ED COURSE  Pertinent labs & imaging results that were available during my care of the patient were reviewed by me and considered in my medical decision making (see chart for details).  Patient presents for episode of acute change in mental status. Notably hypoglycemic, history of diabetes. Patient reports he and the last week adjusted his insulin to 40 units saline 6:30 as his blood sugars have been staying somewhat elevated and he did so at the request of his home health nurse.  Today's presentation appears most consistent with acute hypoglycemia, and after the patient has been watched in the emergency room his blood sugars have stabilized. He is awake and alert in no distress, I did discuss with nephrology his potassium which is elevated today who they advised discontinuing his spironolactone and lisinopril. I discussed with the patient and he will reduce his insulin back to his previous 30 units of Lantus nightly. He and his wife are in agreement with the plan to check his blood sugar 4 times daily plus for the next couple of nights about 2:58 in the morning and also an alarm  clock to check his blood sugar  Patient's labs are notable for some leukocytosis, however he has no infectious symptoms chest x-ray clear, urinalysis clear, no fever, no suspect this may be most likely due to a reaction or response to his episode of hypoglycemia she exhibits no infectious symptoms.  Return precautions and treatment recommendations and follow-up discussed with the patient who is agreeable with the plan.       ____________________________________________   FINAL CLINICAL IMPRESSION(S) / ED DIAGNOSES  Final diagnoses:  Hypoglycemia due to type 1 diabetes mellitus (HCC)      NEW MEDICATIONS STARTED DURING THIS VISIT:  New Prescriptions   SODIUM POLYSTYRENE (KAYEXALATE) 15 GM/60ML SUSPENSION    Take 60 mLs (15 g total) by mouth 2 (two) times daily.     Note:  This document was prepared using Dragon voice recognition software and may include unintentional dictation errors.     Sharyn Creamer, MD 04/27/16 [phone]

## 2016-04-27 NOTE — ED Notes (Signed)
Pt given food tray.  Family at bedside. Awaiting renal MD

## 2016-04-27 NOTE — ED Notes (Signed)
Patient transported to CT by CT tech

## 2016-04-27 NOTE — Discharge Instructions (Signed)
Please decrease your Lantus insulin dose to 30 units each evening. Do not use the 40 units nightly that your nurse had recommended as I suspect this is caused her blood sugars to be running too low.  Please make sure to set an alarm and check her blood sugar at 2 in the morning, if it is low or less than 100 please have a cup of orange juice or a meal such as a sandwich.  Stop your lisinopril and spironolactone as recommended by our kidney specialist as your potassium level is too high. Follow-up with your kidney specialist next week as you have planned, and please make sure to ask them to repeat your potassium level at that time. Please start Kayexalate which is a liquid to help keep your potassium level down.

## 2016-04-27 NOTE — ED Notes (Signed)
Pts. blood sugar 72 after drinking one cup of orange juice. Notified MD Quale. This tech gave pt. second cup of orange juice. Will check blood sugar again within 20 minutes.

## 2016-04-27 NOTE — ED Notes (Signed)
Pt. Going home with wife. 

## 2016-04-27 NOTE — ED Triage Notes (Signed)
[facility]. Per EMS they were called out for unresponsive patient, when EMS arrived patient had snoring respirations. Patients CBG was in the 40's, patient given 1.5 amps of D50, patients CBG rechecked in the 500's and patient became more responsive.   On arrival to ED patient has slurred speech and states that he feels sluggish. Patient does not appear to be in any distress at this time, breathing is equal and unlabored, color WNL.

## 2016-04-27 NOTE — ED Notes (Addendum)
cbg of 75 reported verbally to edp, orders to give 4oz of PO OJ. Continue to monitor.

## 2016-04-27 NOTE — ED Notes (Signed)
Nephrology called to inform patient they have an appointment with nephrology on the 24 th of this month with Dr. Lesle ChrisLatiff [facility].

## 2016-05-02 ENCOUNTER — Other Ambulatory Visit (INDEPENDENT_AMBULATORY_CARE_PROVIDER_SITE_OTHER): Payer: Self-pay | Admitting: Vascular Surgery

## 2016-05-02 ENCOUNTER — Encounter (INDEPENDENT_AMBULATORY_CARE_PROVIDER_SITE_OTHER): Payer: Self-pay | Admitting: Vascular Surgery

## 2016-05-02 ENCOUNTER — Encounter (INDEPENDENT_AMBULATORY_CARE_PROVIDER_SITE_OTHER): Payer: Self-pay

## 2016-05-02 ENCOUNTER — Ambulatory Visit (INDEPENDENT_AMBULATORY_CARE_PROVIDER_SITE_OTHER): Payer: Commercial Managed Care - PPO | Admitting: Vascular Surgery

## 2016-05-02 VITALS — BP 116/54 | HR 87 | Resp 16 | Wt 165.0 lb

## 2016-05-02 DIAGNOSIS — E118 Type 2 diabetes mellitus with unspecified complications: Secondary | ICD-10-CM | POA: Diagnosis not present

## 2016-05-02 DIAGNOSIS — E7800 Pure hypercholesterolemia, unspecified: Secondary | ICD-10-CM | POA: Diagnosis not present

## 2016-05-02 DIAGNOSIS — Z794 Long term (current) use of insulin: Secondary | ICD-10-CM | POA: Diagnosis not present

## 2016-05-02 DIAGNOSIS — I70219 Atherosclerosis of native arteries of extremities with intermittent claudication, unspecified extremity: Secondary | ICD-10-CM | POA: Insufficient documentation

## 2016-05-02 DIAGNOSIS — I70234 Atherosclerosis of native arteries of right leg with ulceration of heel and midfoot: Secondary | ICD-10-CM

## 2016-05-02 NOTE — Progress Notes (Signed)
Subjective:    Patient ID: Damon [name], male    DOB: 21-Feb-1951, 66 y.o.   MRN: [id number] Chief Complaint  Patient presents with  . Follow-up   Patient presents at the request of his home visiting nurse. Patient seen with a male family member. Patient endorses a history of approximately one month of right foot ulceration. The ulceration is located on the lateral aspect of the patients foot. The ulceration started out as a blister which popped. The wound has been present for about one month. Patient states it has not shown any signs of healing. Patient states a foul odor from the wound. His visiting nurse is concerned as the foot is "warm, erythematous and tender to palpation." Patient is asking for pain medication due to his wound and right foot pain. Patient denies any fever, nausea or vomiting. Patient doesn't do much walking. Patient has not received any wound treatment. He has not been placed on any ABX.     Review of Systems  Constitutional: Negative.   HENT: Negative.   Eyes: Negative.   Respiratory: Negative.   Cardiovascular: Positive for leg swelling (Right Foot).  Gastrointestinal: Negative.   Endocrine: Negative.   Genitourinary: Negative.   Skin:       Right foot wound  Allergic/Immunologic: Negative.   Neurological: Negative.   Hematological: Negative.   Psychiatric/Behavioral: Negative.       Objective:   Physical Exam  Constitutional: He is oriented to person, place, and time. He appears well-developed and well-nourished.  HENT:  Head: Normocephalic and atraumatic.  Right Ear: External ear normal.  Left Ear: External ear normal.  Eyes: Conjunctivae and EOM are normal. Pupils are equal, round, and reactive to light.  Neck: Normal range of motion. Neck supple.  Cardiovascular: Normal rate, regular rhythm and normal heart sounds.   Pulses:      Radial pulses are 2+ on the right side, and 2+ on the left side.       Dorsalis pedis pulses are 0 on the right side,  and 1+ on the left side.       Posterior tibial pulses are 0 on the right side, and 1+ on the left side.  Pulmonary/Chest: Effort normal and breath sounds normal.  Abdominal: Soft. Bowel sounds are normal.  Musculoskeletal: Normal range of motion. He exhibits edema (Mild Right Foot Edema).  Neurological: He is alert and oriented to person, place, and time.  Skin:  Right Foot: Cellulitic to mid foot. Tender to palpation. Warm. 4cm x 3cm ulceration noted. Draining serous fluid. Malodorous. No gangrene noted.   Psychiatric: He has a normal mood and affect. His behavior is normal. Judgment and thought content normal.   BP (!) 116/54   Pulse 87   Resp 16   Wt 165 lb (74.8 kg)   BMI 25.09 kg/m   Past Medical History:  Diagnosis Date  . CKD (chronic kidney disease), stage III   . Diabetes mellitus without complication (HCC)   . Diverticulitis   . Essential hypertension   . MI (myocardial infarction)   . PAD (peripheral artery disease) (HCC)    a. 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  . Tobacco abuse    Social History   Social History  . Marital status: Married    Spouse name: N/A  . Number of children: N/A  . Years of education: N/A   Occupational History  . Not on file.  Social History Main Topics  . Smoking status: Current Every Day Smoker    Packs/day: 0.50    Years: 15.00    Types: Cigarettes  . Smokeless tobacco: Current User  . Alcohol use No  . Drug use: No  . Sexual activity: Not on file   Other Topics Concern  . Not on file   Social History Narrative   Lives in [place] with his wife.  Does not routinely exercise.   Past Surgical History:  Procedure Laterality Date  . CARDIAC CATHETERIZATION N/A 01/13/2016   Procedure: LEFT HEART CATH AND CORONARY ANGIOGRAPHY;  Surgeon: Iran Ouch, MD;  Location: [facility] INVASIVE CV [facility];  Service: Cardiovascular;  Laterality: N/A;  . CARDIAC CATHETERIZATION N/A 01/13/2016    Procedure: Coronary Stent Intervention;  Surgeon: Iran Ouch, MD;  Location: [facility] INVASIVE CV [facility];  Service: Cardiovascular;  Laterality: N/A;  . CARDIAC CATHETERIZATION N/A 01/16/2016   Procedure: Left Heart Cath and Coronary Angiography;  Surgeon: Iran Ouch, MD;  Location: [facility] INVASIVE CV LAB;  Service: Cardiovascular;  Laterality: N/A;  . NECK SURGERY    . PERIPHERAL VASCULAR CATHETERIZATION Left 11/01/2015   Procedure: Lower Extremity Angiography;  Surgeon: Renford Dills, MD;  Location: [facility] INVASIVE CV LAB;  Service: Cardiovascular;  Laterality: Left;   Family History  Problem Relation Age of Onset  . Intracerebral hemorrhage Mother   . Diabetes Mother   . Cancer Mother   . Diabetes Father    No Known Allergies     Assessment & Plan:  Patient presents at the request of his home visiting nurse. Patient seen with a male family member. Patient endorses a history of approximately one month of right foot ulceration. The ulceration is located on the lateral aspect of the patients foot. The ulceration started out as a blister which popped. The wound has been present for about one month. Patient states it has not shown any signs of healing. Patient states a foul odor from the wound. His visiting nurse is concerned as the foot is "warm, erythematous and tender to palpation." Patient is asking for pain medication due to his wound and right foot pain. Patient denies any fever, nausea or vomiting. Patient doesn't do much walking. Patient has not received any wound treatment. He has not been placed on any ABX.    1. 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 Recommend RLE angiogram in an attempt to revascularize the extremity.    Procedure, risks and benefits explained to patient.  All questions answered. Patient wishes to proceed.  2. Pure hypercholesterolemia - Stable Encouraged good control as its slows the progression of atherosclerotic disease  3. Type 2 diabetes mellitus with complication, with long-term current use of insulin (HCC) - Stable Encouraged good control as its slows the progression of atherosclerotic disease  Current Outpatient Prescriptions on File Prior to Visit  Medication Sig Dispense Refill  . ARIPiprazole (ABILIFY) 15 MG tablet Take 15 mg by mouth daily.    Marland Kitchen aspirin EC 81 MG tablet Take 1 tablet (81 mg total) by mouth daily. 150 tablet 2  . atorvastatin (LIPITOR) 80 MG tablet Take 1 tablet (80 mg total) by mouth daily at 6 PM. 30 tablet 2  . carvedilol (COREG) 12.5  MG tablet Take 1 tablet (12.5 mg total) by mouth 2 (two) times daily with a meal. 60 tablet 3  . gabapentin (NEURONTIN) 300 MG capsule Take 300 mg by mouth daily as needed.     . insulin glargine (LANTUS) 100 UNIT/ML injection Inject 30 Units into the skin every evening.     . sodium polystyrene (KAYEXALATE) 15 GM/60ML suspension Take 60 mLs (15 g total) by mouth 2 (two) times daily. 500 mL 0  . ticagrelor (BRILINTA) 90 MG TABS tablet Take 1 tablet (90 mg total) by mouth 2 (two) times daily. 60 tablet 2   No current facility-administered medications on file prior to visit.     There are no Patient Instructions on file for this visit. No Follow-up on file.   Roger Fasnacht A Chyanne Kohut, PA-C

## 2016-05-03 ENCOUNTER — Emergency Department: Payer: Commercial Managed Care - PPO

## 2016-05-03 ENCOUNTER — Inpatient Hospital Stay
Admission: EM | Admit: 2016-05-03 | Discharge: 2016-05-11 | DRG: 253 | Disposition: A | Discharge status: Skilled Nursing Facility | Payer: Commercial Managed Care - PPO | Attending: Internal Medicine | Admitting: Internal Medicine

## 2016-05-03 ENCOUNTER — Telehealth (INDEPENDENT_AMBULATORY_CARE_PROVIDER_SITE_OTHER): Payer: Self-pay

## 2016-05-03 DIAGNOSIS — E11649 Type 2 diabetes mellitus with hypoglycemia without coma: Secondary | ICD-10-CM | POA: Diagnosis present

## 2016-05-03 DIAGNOSIS — J449 Chronic obstructive pulmonary disease, unspecified: Secondary | ICD-10-CM | POA: Diagnosis present

## 2016-05-03 DIAGNOSIS — L03115 Cellulitis of right lower limb: Secondary | ICD-10-CM | POA: Diagnosis present

## 2016-05-03 DIAGNOSIS — D638 Anemia in other chronic diseases classified elsewhere: Secondary | ICD-10-CM | POA: Diagnosis present

## 2016-05-03 DIAGNOSIS — E1140 Type 2 diabetes mellitus with diabetic neuropathy, unspecified: Secondary | ICD-10-CM | POA: Diagnosis present

## 2016-05-03 DIAGNOSIS — L97519 Non-pressure chronic ulcer of other part of right foot with unspecified severity: Secondary | ICD-10-CM | POA: Diagnosis present

## 2016-05-03 DIAGNOSIS — E1122 Type 2 diabetes mellitus with diabetic chronic kidney disease: Secondary | ICD-10-CM | POA: Diagnosis present

## 2016-05-03 DIAGNOSIS — E1169 Type 2 diabetes mellitus with other specified complication: Secondary | ICD-10-CM | POA: Diagnosis present

## 2016-05-03 DIAGNOSIS — I96 Gangrene, not elsewhere classified: Secondary | ICD-10-CM | POA: Diagnosis present

## 2016-05-03 DIAGNOSIS — Z9861 Coronary angioplasty status: Secondary | ICD-10-CM

## 2016-05-03 DIAGNOSIS — Z794 Long term (current) use of insulin: Secondary | ICD-10-CM

## 2016-05-03 DIAGNOSIS — Z7982 Long term (current) use of aspirin: Secondary | ICD-10-CM | POA: Diagnosis not present

## 2016-05-03 DIAGNOSIS — E871 Hypo-osmolality and hyponatremia: Secondary | ICD-10-CM | POA: Diagnosis present

## 2016-05-03 DIAGNOSIS — I129 Hypertensive chronic kidney disease with stage 1 through stage 4 chronic kidney disease, or unspecified chronic kidney disease: Secondary | ICD-10-CM | POA: Diagnosis present

## 2016-05-03 DIAGNOSIS — M869 Osteomyelitis, unspecified: Secondary | ICD-10-CM | POA: Diagnosis present

## 2016-05-03 DIAGNOSIS — E1152 Type 2 diabetes mellitus with diabetic peripheral angiopathy with gangrene: Secondary | ICD-10-CM | POA: Diagnosis present

## 2016-05-03 DIAGNOSIS — E11621 Type 2 diabetes mellitus with foot ulcer: Secondary | ICD-10-CM | POA: Diagnosis present

## 2016-05-03 DIAGNOSIS — Z833 Family history of diabetes mellitus: Secondary | ICD-10-CM | POA: Diagnosis not present

## 2016-05-03 DIAGNOSIS — B951 Streptococcus, group B, as the cause of diseases classified elsewhere: Secondary | ICD-10-CM | POA: Diagnosis present

## 2016-05-03 DIAGNOSIS — IMO0002 Reserved for concepts with insufficient information to code with codable children: Secondary | ICD-10-CM

## 2016-05-03 DIAGNOSIS — I252 Old myocardial infarction: Secondary | ICD-10-CM | POA: Diagnosis not present

## 2016-05-03 DIAGNOSIS — Z79899 Other long term (current) drug therapy: Secondary | ICD-10-CM | POA: Diagnosis not present

## 2016-05-03 DIAGNOSIS — F17210 Nicotine dependence, cigarettes, uncomplicated: Secondary | ICD-10-CM | POA: Diagnosis present

## 2016-05-03 DIAGNOSIS — M6281 Muscle weakness (generalized): Secondary | ICD-10-CM

## 2016-05-03 DIAGNOSIS — Z7951 Long term (current) use of inhaled steroids: Secondary | ICD-10-CM | POA: Diagnosis not present

## 2016-05-03 DIAGNOSIS — N183 Chronic kidney disease, stage 3 (moderate): Secondary | ICD-10-CM | POA: Diagnosis present

## 2016-05-03 DIAGNOSIS — R509 Fever, unspecified: Secondary | ICD-10-CM | POA: Diagnosis present

## 2016-05-03 DIAGNOSIS — R2681 Unsteadiness on feet: Secondary | ICD-10-CM

## 2016-05-03 DIAGNOSIS — I70238 Atherosclerosis of native arteries of right leg with ulceration of other part of lower right leg: Secondary | ICD-10-CM | POA: Diagnosis not present

## 2016-05-03 DIAGNOSIS — E118 Type 2 diabetes mellitus with unspecified complications: Secondary | ICD-10-CM

## 2016-05-03 DIAGNOSIS — R296 Repeated falls: Secondary | ICD-10-CM

## 2016-05-03 HISTORY — DX: Chronic obstructive pulmonary disease, unspecified: J44.9

## 2016-05-03 LAB — CBC WITH DIFFERENTIAL/PLATELET
BASOS ABS: 0 10*3/uL (ref 0–0.1)
Basophils Relative: 0 %
EOS ABS: 0.1 10*3/uL (ref 0–0.7)
EOS PCT: 1 %
HCT: 28 % — ABNORMAL LOW (ref 40.0–52.0)
Hemoglobin: 9.7 g/dL — ABNORMAL LOW (ref 13.0–18.0)
LYMPHS PCT: 5 %
Lymphs Abs: 0.9 10*3/uL — ABNORMAL LOW (ref 1.0–3.6)
MCH: 32.3 pg (ref 26.0–34.0)
MCHC: 34.7 g/dL (ref 32.0–36.0)
MCV: 93 fL (ref 80.0–100.0)
Monocytes Absolute: 1.2 10*3/uL — ABNORMAL HIGH (ref 0.2–1.0)
Monocytes Relative: 7 %
Neutro Abs: 14.8 10*3/uL — ABNORMAL HIGH (ref 1.4–6.5)
Neutrophils Relative %: 87 %
PLATELETS: 404 10*3/uL (ref 150–440)
RBC: 3.01 MIL/uL — AB (ref 4.40–5.90)
RDW: 15 % — ABNORMAL HIGH (ref 11.5–14.5)
WBC: 17 10*3/uL — AB (ref 3.8–10.6)

## 2016-05-03 LAB — COMPREHENSIVE METABOLIC PANEL
ALT: 47 U/L (ref 17–63)
AST: 35 U/L (ref 15–41)
Albumin: 2.8 g/dL — ABNORMAL LOW (ref 3.5–5.0)
Alkaline Phosphatase: 81 U/L (ref 38–126)
Anion gap: 10 (ref 5–15)
BILIRUBIN TOTAL: 0.8 mg/dL (ref 0.3–1.2)
BUN: 21 mg/dL — AB (ref 6–20)
CO2: 22 mmol/L (ref 22–32)
CREATININE: 1.24 mg/dL (ref 0.61–1.24)
Calcium: 8.6 mg/dL — ABNORMAL LOW (ref 8.9–10.3)
Chloride: 102 mmol/L (ref 101–111)
GFR calc Af Amer: >60 mL/min (ref >60)
GFR, EST NON AFRICAN AMERICAN: 60 mL/min — AB (ref >60)
Glucose, Bld: 52 mg/dL — ABNORMAL LOW (ref 65–99)
POTASSIUM: 4.3 mmol/L (ref 3.5–5.1)
Sodium: 134 mmol/L — ABNORMAL LOW (ref 135–145)
TOTAL PROTEIN: 6.9 g/dL (ref 6.5–8.1)

## 2016-05-03 LAB — GLUCOSE, CAPILLARY
GLUCOSE-CAPILLARY: 30 mg/dL — AB (ref 65–99)
Glucose-Capillary: 142 mg/dL — ABNORMAL HIGH (ref 65–99)

## 2016-05-03 LAB — INFLUENZA PANEL BY PCR (TYPE A & B)
INFLBPCR: NEGATIVE
Influenza A By PCR: NEGATIVE

## 2016-05-03 LAB — LACTIC ACID, PLASMA: LACTIC ACID, VENOUS: 1.7 mmol/L (ref 0.5–1.9)

## 2016-05-03 LAB — TROPONIN I: Troponin I: 0.06 ng/mL (ref <0.03)

## 2016-05-03 MED ORDER — OXYCODONE-ACETAMINOPHEN 5-325 MG PO TABS
1.0000 | ORAL_TABLET | Freq: Four times a day (QID) | ORAL | Status: DC | PRN
  Administered 2016-05-05 – 2016-05-11 (×9): 1 via ORAL
  Filled 2016-05-03 (×9): qty 1

## 2016-05-03 MED ORDER — DEXTROSE 50 % IV SOLN
INTRAVENOUS | Status: AC
  Administered 2016-05-03: 50 mL via INTRAVENOUS
  Filled 2016-05-03: qty 50

## 2016-05-03 MED ORDER — INSULIN ASPART 100 UNIT/ML ~~LOC~~ SOLN
0.0000 [IU] | Freq: Three times a day (TID) | SUBCUTANEOUS | Status: DC
  Administered 2016-05-04: 2 [IU] via SUBCUTANEOUS
  Administered 2016-05-05: 1 [IU] via SUBCUTANEOUS
  Administered 2016-05-05: 2 [IU] via SUBCUTANEOUS
  Administered 2016-05-05: 5 [IU] via SUBCUTANEOUS
  Administered 2016-05-06: 3 [IU] via SUBCUTANEOUS
  Administered 2016-05-06: 2 [IU] via SUBCUTANEOUS
  Administered 2016-05-06 – 2016-05-07 (×2): 3 [IU] via SUBCUTANEOUS
  Administered 2016-05-07: 2 [IU] via SUBCUTANEOUS
  Administered 2016-05-08: 1 [IU] via SUBCUTANEOUS
  Administered 2016-05-08: 2 [IU] via SUBCUTANEOUS
  Administered 2016-05-08: 1 [IU] via SUBCUTANEOUS
  Administered 2016-05-09 (×2): 2 [IU] via SUBCUTANEOUS
  Administered 2016-05-09 – 2016-05-10 (×2): 1 [IU] via SUBCUTANEOUS
  Administered 2016-05-11: 2 [IU] via SUBCUTANEOUS
  Filled 2016-05-03: qty 1
  Filled 2016-05-03: qty 2
  Filled 2016-05-03: qty 5
  Filled 2016-05-03 (×2): qty 2
  Filled 2016-05-03: qty 1
  Filled 2016-05-03: qty 2
  Filled 2016-05-03 (×2): qty 3
  Filled 2016-05-03: qty 2
  Filled 2016-05-03: qty 3
  Filled 2016-05-03: qty 2
  Filled 2016-05-03: qty 1
  Filled 2016-05-03: qty 2

## 2016-05-03 MED ORDER — INSULIN GLARGINE 100 UNIT/ML ~~LOC~~ SOLN
30.0000 [IU] | Freq: Every evening | SUBCUTANEOUS | Status: DC
  Filled 2016-05-03: qty 0.3

## 2016-05-03 MED ORDER — SODIUM CHLORIDE 0.9 % IV BOLUS (SEPSIS)
1000.0000 mL | Freq: Once | INTRAVENOUS | Status: AC
  Administered 2016-05-03: 1000 mL via INTRAVENOUS

## 2016-05-03 MED ORDER — ACETAMINOPHEN 650 MG RE SUPP: 650.0000 mg | Freq: Four times a day (QID) | RECTAL | Status: DC | PRN

## 2016-05-03 MED ORDER — ALBUTEROL SULFATE (2.5 MG/3ML) 0.083% IN NEBU: 2.5000 mg | INHALATION_SOLUTION | Freq: Four times a day (QID) | RESPIRATORY_TRACT | Status: DC | PRN

## 2016-05-03 MED ORDER — IPRATROPIUM-ALBUTEROL 0.5-2.5 (3) MG/3ML IN SOLN: 3.0000 mL | RESPIRATORY_TRACT | Status: DC | PRN

## 2016-05-03 MED ORDER — DEXTROSE 50 % IV SOLN
1.0000 | Freq: Once | INTRAVENOUS | Status: AC
  Administered 2016-05-03: 50 mL via INTRAVENOUS

## 2016-05-03 MED ORDER — ACETAMINOPHEN 325 MG PO TABS
650.0000 mg | ORAL_TABLET | Freq: Four times a day (QID) | ORAL | Status: DC | PRN
  Administered 2016-05-04 – 2016-05-07 (×6): 650 mg via ORAL
  Filled 2016-05-03 (×6): qty 2

## 2016-05-03 MED ORDER — PIPERACILLIN-TAZOBACTAM 3.375 G IVPB 30 MIN: 3.3750 g | Freq: Three times a day (TID) | INTRAVENOUS | Status: DC

## 2016-05-03 MED ORDER — ACETAMINOPHEN 500 MG PO TABS
ORAL_TABLET | ORAL | Status: AC
  Administered 2016-05-03: 1000 mg via ORAL
  Filled 2016-05-03: qty 2

## 2016-05-03 MED ORDER — SENNA 8.6 MG PO TABS
1.0000 | ORAL_TABLET | Freq: Two times a day (BID) | ORAL | Status: DC
  Administered 2016-05-04 – 2016-05-10 (×13): 8.6 mg via ORAL
  Filled 2016-05-03 (×15): qty 1

## 2016-05-03 MED ORDER — VANCOMYCIN HCL IN DEXTROSE 1-5 GM/200ML-% IV SOLN
1000.0000 mg | Freq: Once | INTRAVENOUS | Status: DC
  Administered 2016-05-03: 1000 mg via INTRAVENOUS
  Filled 2016-05-03: qty 200

## 2016-05-03 MED ORDER — TICAGRELOR 90 MG PO TABS
90.0000 mg | ORAL_TABLET | Freq: Two times a day (BID) | ORAL | Status: DC
  Administered 2016-05-04 – 2016-05-11 (×14): 90 mg via ORAL
  Filled 2016-05-03 (×15): qty 1

## 2016-05-03 MED ORDER — ARIPIPRAZOLE 15 MG PO TABS
15.0000 mg | ORAL_TABLET | Freq: Every day | ORAL | Status: DC
  Administered 2016-05-04 – 2016-05-11 (×6): 15 mg via ORAL
  Filled 2016-05-03 (×7): qty 1

## 2016-05-03 MED ORDER — ACETAMINOPHEN 500 MG PO TABS: 500.0000 mg | ORAL_TABLET | Freq: Four times a day (QID) | ORAL | Status: DC | PRN

## 2016-05-03 MED ORDER — GABAPENTIN 300 MG PO CAPS
300.0000 mg | ORAL_CAPSULE | Freq: Every day | ORAL | Status: DC
  Administered 2016-05-04 – 2016-05-11 (×6): 300 mg via ORAL
  Filled 2016-05-03 (×7): qty 1

## 2016-05-03 MED ORDER — ASPIRIN EC 81 MG PO TBEC
81.0000 mg | DELAYED_RELEASE_TABLET | Freq: Every day | ORAL | Status: DC
  Administered 2016-05-04 – 2016-05-11 (×6): 81 mg via ORAL
  Filled 2016-05-03 (×7): qty 1

## 2016-05-03 MED ORDER — BISACODYL 10 MG RE SUPP: 10.0000 mg | Freq: Every day | RECTAL | Status: DC | PRN

## 2016-05-03 MED ORDER — TRAMADOL HCL 50 MG PO TABS
50.0000 mg | ORAL_TABLET | Freq: Four times a day (QID) | ORAL | Status: DC | PRN
  Administered 2016-05-04 – 2016-05-05 (×4): 50 mg via ORAL
  Filled 2016-05-03 (×4): qty 1

## 2016-05-03 MED ORDER — CEFAZOLIN IN D5W 1 GM/50ML IV SOLN: 1.0000 g | Freq: Three times a day (TID) | INTRAVENOUS | Status: DC

## 2016-05-03 MED ORDER — ATORVASTATIN CALCIUM 20 MG PO TABS
80.0000 mg | ORAL_TABLET | Freq: Every day | ORAL | Status: DC
  Administered 2016-05-04 – 2016-05-10 (×7): 80 mg via ORAL
  Filled 2016-05-03 (×7): qty 4

## 2016-05-03 MED ORDER — IPRATROPIUM-ALBUTEROL 18-103 MCG/ACT IN AERO: 2.0000 | INHALATION_SPRAY | RESPIRATORY_TRACT | Status: DC | PRN

## 2016-05-03 MED ORDER — ENOXAPARIN SODIUM 40 MG/0.4ML ~~LOC~~ SOLN
40.0000 mg | SUBCUTANEOUS | Status: DC
  Administered 2016-05-04 – 2016-05-08 (×5): 40 mg via SUBCUTANEOUS
  Filled 2016-05-03 (×5): qty 0.4

## 2016-05-03 MED ORDER — PIPERACILLIN-TAZOBACTAM 3.375 G IVPB
3.3750 g | Freq: Three times a day (TID) | INTRAVENOUS | Status: DC
Start: 2016-05-03 — End: 2016-05-06
  Administered 2016-05-04 – 2016-05-06 (×9): 3.375 g via INTRAVENOUS
  Filled 2016-05-03 (×10): qty 50

## 2016-05-03 MED ORDER — ACETAMINOPHEN 500 MG PO TABS
1000.0000 mg | ORAL_TABLET | Freq: Once | ORAL | Status: AC
  Administered 2016-05-03: 1000 mg via ORAL

## 2016-05-03 MED ORDER — POLYETHYLENE GLYCOL 3350 17 G PO PACK
17.0000 g | PACK | Freq: Every day | ORAL | Status: DC | PRN
  Administered 2016-05-10: 17 g via ORAL
  Filled 2016-05-03: qty 1

## 2016-05-03 NOTE — Telephone Encounter (Signed)
Damon [name] from [facility] called stating the patient is running a fever 101 and has just started taking his antibiotics and she wanted to know what to do. Per Dr. Gilda CreaseSchnier the patient is to take Tylenol and if his fever does not get better he is to go to the ED.

## 2016-05-03 NOTE — ED Notes (Signed)
Witnessed MD Derrill KayGoodman perform rectal exam

## 2016-05-03 NOTE — ED Notes (Addendum)
Patient took (1) 5-325 percocet and 500 mg Cephalexin of home medication with MD Derrill KayGoodman approval

## 2016-05-03 NOTE — ED Notes (Signed)
Pt requesting to take his own Percocet and Keflex. Per Dr. Derrill KayGoodman pt ok to take his own medication. Water provided to patient.

## 2016-05-03 NOTE — ED Provider Notes (Signed)
[facility] Emergency Department Provider Note   ____________________________________________   I have reviewed the triage vital signs and the nursing notes.   HISTORY  Chief Complaint Shortness of Breath   History limited by: poor historian, most history obtained from male companion   HPI Damon [name] is a 66 y.o. male who presents to the emergency department today via EMS because of shortness and breath and a fall. The patient started developing shortness of breath this afternoon. He denies any associated chest pain. When he went to go to the bathroom he fell and was unable to get up off the floor. In addition the patient did feel like he was developing a fever today. The patient feels like he is still having some shortness of breath. Patient is currently undergoing treatment of an ulcer to his right foot, is scheduled for angioplasty with vascular surgery in 4 days.   Past Medical History:  Diagnosis Date  . CKD (chronic kidney disease), stage III   . COPD (chronic obstructive pulmonary disease) (HCC)   . Diabetes mellitus without complication (HCC)   . Diverticulitis   . Essential hypertension   . MI (myocardial infarction)   . PAD (peripheral artery disease) (HCC)    a. 10/2015 s/p PTA and stenting of the RCIA, LCIA, and PTA of the [employer] Environmental manager(Schnier).  . Tobacco abuse     Patient Active Problem List   Diagnosis Date Noted  . Diabetes mellitus type 2 with complications (HCC) 05/02/2016  . 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 Protein-calorie malnutrition, severe 03/28/2016  . Acute renal failure (ARF) (HCC) 03/27/2016  . Hypotension 03/27/2016  . SOB (shortness of breath) 02/15/2016  . CKD (chronic kidney disease), stage II 02/15/2016  . CAD (coronary artery disease) 01/17/2016  . Essential hypertension 01/17/2016  . HLD (hyperlipidemia) 01/17/2016  . ST elevation myocardial  infarction involving left anterior descending (LAD) coronary artery (HCC)   . Cardiomyopathy, ischemic   . Acute respiratory failure (HCC) 01/13/2016  . NSTEMI (non-ST elevated myocardial infarction) [facility](HCC)     Past Surgical History:  Procedure Laterality Date  . CARDIAC CATHETERIZATION N/A 01/13/2016   Procedure: LEFT HEART CATH AND CORONARY ANGIOGRAPHY;  Surgeon: Iran OuchMuhammad A Arida, MD;  Location: [facility] INVASIVE CV LAB;  Service: Cardiovascular;  Laterality: N/A;  . CARDIAC CATHETERIZATION N/A 01/13/2016   Procedure: Coronary Stent Intervention;  Surgeon: Iran OuchMuhammad A Arida, MD;  Location: [facility] INV[facility] CV [facility];  Service: Cardiovascular;  Laterality: N/A;  . CARDIAC CATHETERIZATION N/A 01/16/2016   Procedure: Left Heart Cath and Coronary Angiography;  Surgeon: Iran OuchMuhammad A Arida, MD;  Location: [facility] INVASIVE CV [facility];  Service: Cardiovascular;  Laterality: N/A;  . NECK SURGERY    . PERIPHERAL VASCULAR CATHETERIZATION Left 11/01/2015   Procedure: Lower Extremity Angiography;  Surgeon: Renford DillsGregory G Schnier, MD;  Location: [facility] INVASIVE CV LAB;  Service: Cardiovascular;  Laterality: Left;    Prior to Admission medications   Medication Sig Start Date End Date Taking? Authorizing Provider  ARIPiprazole (ABILIFY) 15 MG tablet Take 15 mg by mouth daily.    Historical Provider, MD  aspirin EC 81 MG tablet Take 1 tablet (81 mg total) by mouth daily. 02/07/16   Sondra Bargesyan M Dunn, PA-C  atorvastatin (LIPITOR) 80 MG tablet Take 1 tablet (80 mg total) by mouth daily at 6 PM. 01/17/16   Enid Baasadhika [name], MD  carvedilol (COREG) 12.5 MG tablet Take 1 tablet (12.5 mg total) by mouth  2 (two) times daily with a meal. 04/03/16   Iran Ouch, MD  gabapentin (NEURONTIN) 300 MG capsule Take 300 mg by mouth daily as needed.     Historical Provider, MD  insulin glargine (LANTUS) 100 UNIT/ML injection Inject 30 Units into the skin every evening.     Historical Provider, MD  ticagrelor (BRILINTA) 90 MG TABS tablet Take 1 tablet (90  mg total) by mouth 2 (two) times daily. 01/17/16   Enid Baas, MD    Allergies Patient has no known allergies.  Family History  Problem Relation Age of Onset  . Intracerebral hemorrhage Mother   . Diabetes Mother   . Cancer Mother   . Diabetes Father     Social History Social History  Substance Use Topics  . Smoking status: Current Every Day Smoker    Packs/day: 0.50    Years: 15.00    Types: Cigarettes  . Smokeless tobacco: Current User  . Alcohol use No    Review of Systems  Constitutional: Positive for fever. Cardiovascular: Negative for chest pain. Respiratory: Positive for shortness of breath. Gastrointestinal: Negative for abdominal pain, vomiting and diarrhea. Genitourinary: Negative for dysuria. Musculoskeletal: Positive for ulcer to right foot. Skin: Positive for ulcer to right foot. Neurological: Negative for headaches, focal weakness or numbness.  10-point ROS otherwise negative.  ____________________________________________   PHYSICAL EXAM:  VITAL SIGNS: ED Triage Vitals  Enc Vitals Group     BP 05/03/16 1610 (!) 169/70     Pulse Rate 05/03/16 1610 92     Resp 05/03/16 1610 (!) 24     Temp 05/03/16 1610 (!) 103.1 F (39.5 C)     Temp Source 05/03/16 1610 Oral     SpO2 05/03/16 1610 98 %     Weight 05/03/16 1611 160 lb (72.6 kg)     Height 05/03/16 1611 5\' 10"  (1.778 m)   Constitutional: Alert and oriented. Well appearing and in no distress. Eyes: Conjunctivae are normal. Normal extraocular movements. ENT   Head: Normocephalic and atraumatic.   Nose: No congestion/rhinnorhea.   Mouth/Throat: Mucous membranes are moist.   Neck: No stridor. Hematological/Lymphatic/Immunilogical: No cervical lymphadenopathy. Cardiovascular: Normal rate, regular rhythm.  No murmurs, rubs, or gallops.  Respiratory: Normal respiratory effort without tachypnea nor retractions. Breath sounds are clear and equal bilaterally. No  wheezes/rales/rhonchi. Gastrointestinal: Soft and non tender. No rebound. No guarding.  Genitourinary: Deferred Musculoskeletal: Normal range of motion in all extremities. No lower extremity edema. Neurologic:  Normal speech and language. No gross focal neurologic deficits are appreciated.  Skin:  Roughly 2 cm diameter ulcer to right lateral foot. Foul odor. Some purulent drainage noted. Psychiatric: Mood and affect are normal. Speech and behavior are normal. Patient exhibits appropriate insight and judgment.  ____________________________________________    LABS (pertinent positives/negatives)  Labs Reviewed  TROPONIN I - Abnormal; Notable for the following:       Result Value   Troponin I 0.06 (*)    All other components within normal limits  COMPREHENSIVE METABOLIC PANEL - Abnormal; Notable for the following:    Sodium 134 (*)    Glucose, Bld 52 (*)    BUN 21 (*)    Calcium 8.6 (*)    Albumin 2.8 (*)    GFR calc non Af Amer 60 (*)    All other components within normal limits  CBC WITH DIFFERENTIAL/PLATELET - Abnormal; Notable for the following:    WBC 17.0 (*)    RBC 3.01 (*)  Hemoglobin 9.7 (*)    HCT 28.0 (*)    RDW 15.0 (*)    Neutro Abs 14.8 (*)    Lymphs Abs 0.9 (*)    Monocytes Absolute 1.2 (*)    All other components within normal limits  AEROBIC CULTURE (SUPERFICIAL SPECIMEN)  CULTURE, BLOOD (ROUTINE X 2)  CULTURE, BLOOD (ROUTINE X 2)  LACTIC ACID, PLASMA  INFLUENZA PANEL BY PCR (TYPE A & B)     ____________________________________________   EKG  I, Phineas Semen, attending physician, personally viewed and interpreted this EKG  EKG Time: 1616 Rate: 90 Rhythm: normal sinus rhythm Axis: left axis deviation Intervals: qtc 496 QRS: narrow, q waves V1-V4 ST changes: no st elevation Impression: abnormal ekg   ____________________________________________    RADIOLOGY  Right foot   IMPRESSION:  Known soft tissue ulceration not well  characterized on radiograph.  No radiopaque foreign bodies seen. No evidence of osseous erosion.   CXR IMPRESSION: No active cardiopulmonary disease. ____________________________________________   PROCEDURES  Procedures  ____________________________________________   INITIAL IMPRESSION / ASSESSMENT AND PLAN / ED COURSE  Pertinent labs & imaging results that were available during my care of the patient were reviewed by me and considered in my medical decision making (see chart for details).  Patient presented to the emergency department today because of concerns for some shortness of breath. Patient was febrile. Additionally patient has elevated white blood cell count. Patient is being treated right foot ulcer. I do think that this is likely the source of the patient's fever. Addition the patient's hemoglobin was decreased over his baseline. This could be secondary to the infection. Will give IV antibiotics. Will plan admission to hospital service.  ____________________________________________   FINAL CLINICAL IMPRESSION(S) / ED DIAGNOSES  Final diagnoses:  Ulcer (HCC)     Note: This dictation was prepared with Dragon dictation. Any transcriptional errors that result from this process are unintentional     Phineas Semen, MD 05/03/16 1906

## 2016-05-03 NOTE — H&P (Signed)
[facility] - [place] at [facility]   PATIENT NAME: Damon [name]    MR#:  [id number]  DATE OF BIRTH:  1950/05/07  DATE OF ADMISSION:  05/03/2016  PRIMARY CARE PHYSICIAN: Emogene Morgan, MD   REQUESTING/REFERRING PHYSICIAN: Dr. Derrill Kay  CHIEF COMPLAINT:   Fever and not feeling well and ulcer on the right foot HISTORY OF PRESENT ILLNESS:  Damon [name]  is a 66 y.o. male with a known history of Peripheral vascular disease status post stenting and PTCA in July 2017 right common iliac left common iliac and posterior tibial artery, ongoing tobacco abuse, hypertension, history of CKD stage III and COPD comes to the emergency room with fever, generalized weakness and foul-smelling ulcer on the right foot which appears dry and has darkened skin around.  -Patient denies any pain on the foot. In the emergency room his found to have elevated white count of 17,000 with fever of 100.1. He is being admitted with SIRS secondary to right lower extremity cellulitis and nonhealing foul-smelling ulcer.   Patient was recently started as outpatient on Keflex 500 mg 4 times a day. He is supposed to finish 10 days of treatment. He is scheduled to have lower extremity angiogram on Monday by vascular surgery.  PAST MEDICAL HISTORY:   Past Medical History:  Diagnosis Date  . CKD (chronic kidney disease), stage III   . COPD (chronic obstructive pulmonary disease) (HCC)   . Diabetes mellitus without complication (HCC)   . Diverticulitis   . Essential hypertension   . MI (myocardial infarction)   . PAD (peripheral artery disease) (HCC)    a. 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  . Tobacco abuse     PAST SURGICAL HISTOIRY:   Past Surgical History:  Procedure Laterality Date  . CARDIAC CATHETERIZATION N/A 01/13/2016   Procedure: LEFT HEART CATH AND CORONARY ANGIOGRAPHY;  Surgeon: Iran Ouch, MD;  Location: [facility] INVASIVE CV LAB;  Service:  Cardiovascular;  Laterality: N/A;  . CARDIAC CATHETERIZATION N/A 01/13/2016   Procedure: Coronary Stent Intervention;  Surgeon: Iran Ouch, MD;  Location: [facility] INVASIVE CV LAB;  Service: Cardiovascular;  Laterality: N/A;  . CARDIAC CATHETERIZATION N/A 01/16/2016   Procedure: Left Heart Cath and Coronary Angiography;  Surgeon: Iran Ouch, MD;  Location: [facility] INVASIVE CV LAB;  Service: Cardiovascular;  Laterality: N/A;  . NECK SURGERY    . PERIPHERAL VASCULAR CATHETERIZATION Left 11/01/2015   Procedure: Lower Extremity Angiography;  Surgeon: Renford Dills, MD;  Location: [facility] INVASIVE CV LAB;  Service: Cardiovascular;  Laterality: Left;    SOCIAL HISTORY:   Social History  Substance Use Topics  . Smoking status: Current Every Day Smoker    Packs/day: 0.50    Years: 15.00    Types: Cigarettes  . Smokeless tobacco: Current User  . Alcohol use No    FAMILY HISTORY:   Family History  Problem Relation Age of Onset  . Intracerebral hemorrhage Mother   . Diabetes Mother   . Cancer Mother   . Diabetes Father     DRUG ALLERGIES:  No Known Allergies  REVIEW OF SYSTEMS:  Review of Systems  Constitutional: Positive for fever and malaise/fatigue. Negative for chills and weight loss.  HENT: Negative for ear discharge, ear pain and nosebleeds.   Eyes: Negative for blurred vision, pain and discharge.  Respiratory: Positive for shortness of breath. Negative for sputum production, wheezing and stridor.   Cardiovascular: Negative  for chest pain, palpitations, orthopnea and PND.  Gastrointestinal: Negative for abdominal pain, diarrhea, nausea and vomiting.  Genitourinary: Negative for frequency and urgency.  Musculoskeletal: Negative for back pain and joint pain.  Neurological: Positive for weakness. Negative for sensory change, speech change and focal weakness.  Psychiatric/Behavioral: Negative for depression and hallucinations. The patient is not nervous/anxious.       MEDICATIONS AT HOME:   Prior to Admission medications   Medication Sig Start Date End Date Taking? Authorizing Provider  acetaminophen (TYLENOL) 500 MG tablet Take 500 mg by mouth every 6 (six) hours as needed.   Yes Historical Provider, MD  albuterol-ipratropium (COMBIVENT) 18-103 MCG/ACT inhaler Inhale into the lungs every 4 (four) hours as needed for wheezing or shortness of breath.   Yes Historical Provider, MD  ARIPiprazole (ABILIFY) 15 MG tablet Take 15 mg by mouth daily.   Yes Historical Provider, MD  aspirin EC 81 MG tablet Take 1 tablet (81 mg total) by mouth daily. 02/07/16  Yes Ryan M Dunn, PA-C  atorvastatin (LIPITOR) 80 MG tablet Take 1 tablet (80 mg total) by mouth daily at 6 PM. 01/17/16  Yes Enid Baasadhika [name], MD  cephALEXin (KEFLEX) 500 MG capsule Take 500 mg by mouth every 6 (six) hours as needed.   Yes Historical Provider, MD  gabapentin (NEURONTIN) 300 MG capsule Take 300 mg by mouth daily as needed.    Yes Historical Provider, MD  insulin glargine (LANTUS) 100 UNIT/ML injection Inject 30 Units into the skin every evening.    Yes Historical Provider, MD  oxyCODONE-acetaminophen (PERCOCET/ROXICET) 5-325 MG tablet Take 1 tablet by mouth every 6 (six) hours as needed for severe pain.   Yes Historical Provider, MD  ticagrelor (BRILINTA) 90 MG TABS tablet Take 1 tablet (90 mg total) by mouth 2 (two) times daily. 01/17/16  Yes Enid Baasadhika [name], MD  carvedilol (COREG) 12.5 MG tablet Take 1 tablet (12.5 mg total) by mouth 2 (two) times daily with a meal. Patient not taking: Reported on 05/03/2016 04/03/16   Iran OuchMuhammad A Arida, MD      VITAL SIGNS:  Blood pressure 123/62, pulse 83, temperature 98.7 F (37.1 C), temperature source Oral, resp. rate 17, height 5\' 10"  (1.778 m), weight 72.6 kg (160 lb), SpO2 100 %.  PHYSICAL EXAMINATION:  GENERAL:  66 y.o.-year-old patient lying in the bed with no acute distress.  EYES: Pupils equal, round, reactive to light and accommodation. No  scleral icterus. Extraocular muscles intact.  HEENT: Head atraumatic, normocephalic. Oropharynx and nasopharynx clear.  NECK:  Supple, no jugular venous distention. No thyroid enlargement, no tenderness.  LUNGS: Normal breath sounds bilaterally, no wheezing, rales,rhonchi or crepitation. No use of accessory muscles of respiration.  CARDIOVASCULAR: S1, S2 normal. No murmurs, rubs, or gallops.  ABDOMEN: Soft, nontender, nondistended. Bowel sounds present. No organomegaly or mass.  EXTREMITIES: No pedal edema, cyanosis, or clubbing. Bilateral lower extremity edema and foul smelling dry gangrene ulcer on the right foot lateral wall. No discharge. Patient has some dead skin around. Foul smelling noted. Given his dark skin I cannot appreciate any erythema around.  NEUROLOGIC: Cranial nerves II through XII are intact. Muscle strength 5/5 in all extremities. Sensation intact. Gait not checked.  PSYCHIATRIC: The patient is alert and oriented x 3.  SKIN: No obvious rash, lesion, or ulcer.   LABORATORY PANEL:   CBC  Recent Labs Lab 05/03/16 1631  WBC 17.0*  HGB 9.7*  HCT 28.0*  PLT 404   ------------------------------------------------------------------------------------------------------------------  Chemistries   Recent  Labs Lab 05/03/16 1631  NA 134*  K 4.3  CL 102  CO2 22  GLUCOSE 52*  BUN 21*  CREATININE 1.24  CALCIUM 8.6*  AST 35  ALT 47  ALKPHOS 81  BILITOT 0.8   ------------------------------------------------------------------------------------------------------------------  Cardiac Enzymes  Recent Labs Lab 05/03/16 1631  TROPONINI 0.06*   ------------------------------------------------------------------------------------------------------------------  RADIOLOGY:  Dg Chest 2 View  Result Date: 05/03/2016 CLINICAL DATA:  Shortness of breath with fever. EXAM: CHEST  2 VIEW COMPARISON:  04/27/2016 FINDINGS: The heart size and mediastinal contours are within normal  limits. Both lungs are clear. The visualized skeletal structures are unremarkable. IMPRESSION: No active cardiopulmonary disease. Electronically Signed   By: Kennith Center M.D.   On: 05/03/2016 17:37   Dg Foot Complete Right  Result Date: 05/03/2016 CLINICAL DATA:  Assess right lateral foot ulceration. Current history of diabetes. Initial encounter. EXAM: RIGHT FOOT COMPLETE - 3+ VIEW COMPARISON:  None. FINDINGS: There is no evidence of fracture or dislocation. There is no evidence of osseous erosion. The joint spaces are preserved. There is no evidence of talar subluxation; the subtalar joint is unremarkable in appearance. Small plantar and posterior calcaneal spurs are seen. The known soft tissue ulceration is not well characterized on radiograph. No radiopaque foreign bodies are seen. IMPRESSION: Known soft tissue ulceration not well characterized on radiograph. No radiopaque foreign bodies seen. No evidence of osseous erosion. Electronically Signed   By: Roanna Raider M.D.   On: 05/03/2016 17:42    EKG:    IMPRESSION AND PLAN:   Breyton Vanscyoc  is a 65 y.o. male with a known history of Peripheral vascular disease status post stenting and PTCA in July 2017 right common iliac left common iliac and posterior tibial artery, ongoing tobacco abuse, hypertension, history of CKD stage III and COPD comes to the emergency room with fever, generalized weakness and foul-smelling ulcer on the right foot which appears dry and has darkened skin around.   1. Right foot nonhealing dry gangrene/ulcer with foul-smelling -Patient presented with fever of 100.7, generalized malaise and weakness and tested negative for flu. -He was started recently on Keflex however I will change his antibiotics to IV Zosyn -Follow blood cultures -Vascular surgery consult. Patient is scheduled to have angiogram of his lower extremity on Monday -Continue his aspirin  2. History of COPD however does not have any cough or  wheezing -Continue oral inhalers and nebs  3. Hypertension continue home meds  4. Tobacco abuse discussed smoking cessation more than 4 minutes spent.   All the records are reviewed and case discussed with ED provider. Management plans discussed with the patient, family and they are in agreement.  CODE STATUS: Full TOTAL TIME TAKING CARE OF THIS PATIENT: 50 minutes.    Orvis Stann M.D on 05/03/2016 at 7:53 PM  Between 7am to 6pm - Pager - [phone]  After 6pm go to [email or web address] - password [email or web address] [facility]  [facility]  Office  [phone]  CC: Primary care physician; Emogene Morgan, MD

## 2016-05-03 NOTE — ED Notes (Signed)
Applied sterile dressing to right foot per MD request

## 2016-05-03 NOTE — ED Triage Notes (Signed)
Per EMS: Pt c/o SOB. Pt has hx of COPD and diabetes. Pt has healing diabetic ulcer right foot - pt currently taking antibiotics for wound.

## 2016-05-04 ENCOUNTER — Inpatient Hospital Stay: Admission: RE | Admit: 2016-05-04 | Payer: Commercial Managed Care - PPO | Source: Ambulatory Visit

## 2016-05-04 LAB — GLUCOSE, CAPILLARY
GLUCOSE-CAPILLARY: 93 mg/dL (ref 65–99)
Glucose-Capillary: 73 mg/dL (ref 65–99)
Glucose-Capillary: 194 mg/dL — ABNORMAL HIGH (ref 65–99)
Glucose-Capillary: 144 mg/dL — ABNORMAL HIGH (ref 65–99)

## 2016-05-04 MED ORDER — VANCOMYCIN HCL IN DEXTROSE 750-5 MG/150ML-% IV SOLN
750.0000 mg | Freq: Two times a day (BID) | INTRAVENOUS | Status: DC
  Administered 2016-05-04 – 2016-05-06 (×4): 750 mg via INTRAVENOUS
  Filled 2016-05-04 (×4): qty 150

## 2016-05-04 NOTE — Progress Notes (Addendum)
Inpatient Diabetes Program Recommendations  AACE/ADA: New Consensus Statement on Inpatient Glycemic Control (2015)  Target Ranges:  Prepandial:   less than 140 mg/dL      Peak postprandial:   less than 180 mg/dL (1-2 hours)      Critically ill patients:  140 - 180 mg/dL   Lab Results  Component Value Date   GLUCAP 73 05/04/2016   HGBA1C 8.3 (H) 01/16/2016    Review of Glycemic Control  Results for [name], Jodie L (MRN [id number]) as of 05/04/2016 10:00  Ref. Range 05/03/2016 23:00 05/03/2016 23:02 05/03/2016 23:26 05/04/2016 07:28  Glucose-Capillary Latest Ref Range: 65 - 99 mg/dL 26 (LL) 30 (LL) 952142 (H) 73    Diabetes history: Type 2 Outpatient Diabetes medications: Lantus 30 units qhs Current orders for Inpatient glycemic control: Lantus 30 units qhs, Novolog 0-9 units tid  Inpatient Diabetes Program Recommendations:  Please consider decreasing Lantus to 15 units qhs.   Once the patient is NPO, please consider changing Novolog 0-9 units to q4h.  Spoke to patient this morning, he confirms that he takes no other diabetes related medications other than Lantus- ate 2 meals yesterday before coming to the ED.Susette Racer.    Jahiem Franzoni, RN, BA, MHA, CDE Diabetes Coordinator Inpatient Diabetes Program  [phone] (Team Pager) [phone] [facility] Office) 05/04/2016 10:05 AM

## 2016-05-04 NOTE — Progress Notes (Signed)
Patient blood glucose 26 upon arrival to floor from ED. D50 and hold bedtime lantus, ordered by Dr Anne HahnWillis. Blood glucose 142 at recheck.

## 2016-05-04 NOTE — Progress Notes (Addendum)
Pharmacy Antibiotic Note  Damon [name] is a 66 y.o. male admitted on 05/03/2016 with right foot ulcer.  Pharmacy has been consulted for vancomycin dosing. Pt also on Zosyn.   Plan: Vancomycin 750 mg IV every 12 hours.  Goal trough 15-20 mcg/mL.  Vanc level ordered before 4th dose.  SCr ordered for AM, will need to closely follow renal function as pt with hx of CKD and also on Zosyn.   ke 0.056, half life 12.4, Vd 50.8 [name]  Height: 5\' 10"  (177.8 cm) Weight: 160 lb (72.6 kg) IBW/kg (Calculated) : 73  Temp (24hrs), Avg:100 F (37.8 C), Min:98.4 F (36.9 C), Max:103.1 F (39.5 C)   Recent Labs Lab 05/03/16 1631  WBC 17.0*  CREATININE 1.24  LATICACIDVEN 1.7    Estimated Creatinine Clearance: 61.8 mL/min (by C-G formula based on SCr of 1.24 mg/dL).    No Known Allergies  Antimicrobials this admission: Vancomycin Damon [name]/Zosyn 1/25 >>  Dose adjustments this admission:   Microbiology results: 1/25 BCx: NGTD 1/25 WCx pending  Thank you for allowing pharmacy to be a part of this patient's care.  Damon HeckWang, Damon [name] 05/04/2016 3:19 PM

## 2016-05-04 NOTE — Consult Note (Signed)
[name] Vascular Consult Note  Please see 05/02/16 progress note when patient was seen, examined and treated in clinic. Plan is still for angiogram on Monday.   Damon [name] A Damon Burkel, PA-C  05/04/2016 2:42 PM

## 2016-05-04 NOTE — Progress Notes (Addendum)
[facility] - [place] at [facility]   PATIENT NAME: Damon [name]    MR#:  [id number]  DATE OF BIRTH:  14-Jun-1950  SUBJECTIVE:  CHIEF COMPLAINT:   Chief Complaint  Patient presents with  . Shortness of Breath     Have vascular problems, s/p previous procedures. Came with ulcers and infection.   No complains.  REVIEW OF SYSTEMS:  CONSTITUTIONAL: No fever, fatigue or weakness.  EYES: No blurred or double vision.  EARS, NOSE, AND THROAT: No tinnitus or ear pain.  RESPIRATORY: No cough, shortness of breath, wheezing or hemoptysis.  CARDIOVASCULAR: No chest pain, orthopnea, edema.  GASTROINTESTINAL: No nausea, vomiting, diarrhea or abdominal pain.  GENITOURINARY: No dysuria, hematuria.  ENDOCRINE: No polyuria, nocturia,  HEMATOLOGY: No anemia, easy bruising or bleeding SKIN: ulcer on right foot, rash or lesion. MUSCULOSKELETAL: No joint pain or arthritis.   NEUROLOGIC: No tingling, numbness, weakness.  PSYCHIATRY: No anxiety or depression.   ROS  DRUG ALLERGIES:  No Known Allergies  VITALS:  Blood pressure (!) 126/55, pulse 83, temperature 99.6 F (37.6 C), temperature source Oral, resp. rate 18, height 5\' 10"  (1.778 m), weight 72.6 kg (160 lb), SpO2 100 %.  PHYSICAL EXAMINATION:  GENERAL:  66 y.o.-year-old patient lying in the bed with no acute distress.  EYES: Pupils equal, round, reactive to light and accommodation. No scleral icterus. Extraocular muscles intact.  HEENT: Head atraumatic, normocephalic. Oropharynx and nasopharynx clear.  NECK:  Supple, no jugular venous distention. No thyroid enlargement, no tenderness.  LUNGS: Normal breath sounds bilaterally, no wheezing, rales,rhonchi or crepitation. No use of accessory muscles of respiration.  CARDIOVASCULAR: S1, S2 normal. No murmurs, rubs, or gallops.  ABDOMEN: Soft, nontender, nondistended. Bowel sounds present. No organomegaly or mass.  EXTREMITIES: No pedal edema, cyanosis, or clubbing. Bilateral  lower extremity edema and foul smelling dry gangrene ulcer on the right foot lateral wall. No discharge. Patient has some dead skin around. Foul smelling noted. Given his dark skin I cannot appreciate any erythema around.  NEUROLOGIC: Cranial nerves II through XII are intact. Muscle strength 5/5 in all extremities. Sensation intact. Gait not checked.  PSYCHIATRIC: The patient is alert and oriented x 3.  SKIN: No obvious rash, lesion, or ulcer.    Physical Exam LABORATORY PANEL:   CBC  Recent Labs Lab 05/03/16 1631  WBC 17.0*  HGB 9.7*  HCT 28.0*  PLT 404   ------------------------------------------------------------------------------------------------------------------  Chemistries   Recent Labs Lab 05/03/16 1631  NA 134*  K 4.3  CL 102  CO2 22  GLUCOSE 52*  BUN 21*  CREATININE 1.24  CALCIUM 8.6*  AST 35  ALT 47  ALKPHOS 81  BILITOT 0.8   ------------------------------------------------------------------------------------------------------------------  Cardiac Enzymes  Recent Labs Lab 05/03/16 1631  TROPONINI 0.06*   ------------------------------------------------------------------------------------------------------------------  RADIOLOGY:  Dg Chest 2 View  Result Date: 05/03/2016 CLINICAL DATA:  Shortness of breath with fever. EXAM: CHEST  2 VIEW COMPARISON:  04/27/2016 FINDINGS: The heart size and mediastinal contours are within normal limits. Both lungs are clear. The visualized skeletal structures are unremarkable. IMPRESSION: No active cardiopulmonary disease. Electronically Signed   By: Kennith CenterEric  [name] M.D.   On: 05/03/2016 17:37   Dg Foot Complete Right  Result Date: 05/03/2016 CLINICAL DATA:  Assess right lateral foot ulceration. Current history of diabetes. Initial encounter. EXAM: RIGHT FOOT COMPLETE - 3+ VIEW COMPARISON:  None. FINDINGS: There is no evidence of fracture or dislocation. There is no evidence of osseous erosion. The joint spaces are  preserved. There is no evidence of talar subluxation; the subtalar joint is unremarkable in appearance. Small plantar and posterior calcaneal spurs are seen. The known soft tissue ulceration is not well characterized on radiograph. No radiopaque foreign bodies are seen. IMPRESSION: Known soft tissue ulceration not well characterized on radiograph. No radiopaque foreign bodies seen. No evidence of osseous erosion. Electronically Signed   By: Damon [name] M.D.   On: 05/03/2016 17:42    ASSESSMENT AND PLAN:   Active Problems:   Right foot ulcer (HCC)   Damon [name]  is a 66 y.o. male with a known history of Peripheral vascular disease status post stenting and PTCA in July 2017 right common iliac left common iliac and posterior tibial artery, ongoing tobacco abuse, hypertension, history of CKD stage III and COPD comes to the emergency room with fever, generalized weakness and foul-smelling ulcer on the right foot which appears dry and has darkened skin around.   1. Right foot nonhealing dry gangrene/ulcer with foul-smelling -Patient presented with fever of 100.7, generalized malaise and weakness and tested negative for flu. -He was started recently on Keflex however I will change his antibiotics to IV Zosyn -Follow blood cultures -Vascular surgery consult. Patient is scheduled to have angiogram of his lower extremity on Monday -Continue his aspirin  2. History of COPD however does not have any cough or wheezing -Continue oral inhalers and nebs  3. Hypertension continue home meds  4. Tobacco abuse discussed smoking cessation more than 4 minutes spent.  5. Diabetes   Had hypoglycemia, required Inj D50 05/03/16- so stop lantus, keep monitoring.  All the records are reviewed and case discussed with Care Management/Social Workerr. Management plans discussed with the patient, family and they are in agreement.  CODE STATUS: Full.  TOTAL TIME TAKING CARE OF THIS PATIENT: 35 minutes.      POSSIBLE D/C IN 2-3 DAYS, DEPENDING ON CLINICAL CONDITION.   Damon [name] M.D on 05/04/2016   Between 7am to 6pm - Pager - [phone]  After 6pm go to [email or web address] - password [email or web address]  [facility] [place] [facility]  Office  [phone]  CC: Primary care physician; Emogene Morgan, MD  Note: This dictation was prepared with Dragon dictation along with smaller phrase technology. Any transcriptional errors that result from this process are unintentional.

## 2016-05-05 LAB — GLUCOSE, CAPILLARY
GLUCOSE-CAPILLARY: 140 mg/dL — AB (ref 65–99)
Glucose-Capillary: 198 mg/dL — ABNORMAL HIGH (ref 65–99)
Glucose-Capillary: 251 mg/dL — ABNORMAL HIGH (ref 65–99)
Glucose-Capillary: 143 mg/dL — ABNORMAL HIGH (ref 65–99)

## 2016-05-05 LAB — CREATININE, SERUM
CREATININE: 1.39 mg/dL — AB (ref 0.61–1.24)
GFR calc Af Amer: >60 mL/min (ref >60)
GFR calc non Af Amer: 52 mL/min — ABNORMAL LOW (ref >60)

## 2016-05-05 LAB — HEMOGLOBIN A1C
Hgb A1c MFr Bld: 14.2 % — ABNORMAL HIGH (ref 4.8–5.6)
Mean Plasma Glucose: 361 mg/dL

## 2016-05-05 NOTE — Progress Notes (Signed)
[employer] [facility] - [place] at [facility]   PATIENT NAME: Damon [name]    MR#:  [id number]  DATE OF BIRTH:  [birth year]  SUBJECTIVE:  CHIEF COMPLAINT:   Chief Complaint  Patient presents with  . Shortness of Breath     Have vascular problems, s/p previous procedures. Came with ulcers and infection.   No complains.  REVIEW OF SYSTEMS:  CONSTITUTIONAL: No fever, fatigue or weakness.  EYES: No blurred or double vision.  EARS, NOSE, AND THROAT: No tinnitus or ear pain.  RESPIRATORY: No cough, shortness of breath, wheezing or hemoptysis.  CARDIOVASCULAR: No chest pain, orthopnea, edema.  GASTROINTESTINAL: No nausea, vomiting, diarrhea or abdominal pain.  GENITOURINARY: No dysuria, hematuria.  ENDOCRINE: No polyuria, nocturia,  HEMATOLOGY: No anemia, easy bruising or bleeding SKIN: ulcer on right foot, rash or lesion. MUSCULOSKELETAL: No joint pain or arthritis.   NEUROLOGIC: No tingling, numbness, weakness.  PSYCHIATRY: No anxiety or depression.   ROS  DRUG ALLERGIES:  No Known Allergies  VITALS:  Blood pressure (!) 149/58, pulse 88, temperature 100.2 F (37.9 C), temperature source Oral, resp. rate 17, height 5\' 10"  (1.778 m), weight 160 lb (72.6 kg), SpO2 100 %.  PHYSICAL EXAMINATION:  GENERAL:  66 y.o.-year-old patient lying in the bed with no acute distress.  EYES: Pupils equal, round, reactive to light and accommodation. No scleral icterus. Extraocular muscles intact.  HEENT: Head atraumatic, normocephalic. Oropharynx and nasopharynx clear.  NECK:  Supple, no jugular venous distention. No thyroid enlargement, no tenderness.  LUNGS: Normal breath sounds bilaterally, no wheezing, rales,rhonchi or crepitation. No use of accessory muscles of respiration.  CARDIOVASCULAR: S1, S2 normal. No murmurs, rubs, or gallops.  ABDOMEN: Soft, nontender, nondistended. Bowel sounds present. No organomegaly or mass.  EXTREMITIES: No pedal edema, cyanosis, or clubbing. Bilateral  lower extremity edema and dry gangrene ulcer on the right foot lateral wall in dressing. NEUROLOGIC: Cranial nerves II through XII are intact. Muscle strength 5/5 in all extremities. Sensation intact. Gait not checked.  PSYCHIATRIC: The patient is alert and oriented x 3.  SKIN: No obvious rash, lesion, or ulcer.    Physical Exam LABORATORY PANEL:   CBC  Recent Labs Lab 05/03/16 1631  WBC 17.0*  HGB 9.7*  HCT 28.0*  PLT 404   ------------------------------------------------------------------------------------------------------------------  Chemistries   Recent Labs Lab 05/03/16 1631 05/05/16 0353  NA 134*  --   K 4.3  --   CL 102  --   CO2 22  --   GLUCOSE 52*  --   BUN 21*  --   CREATININE 1.24 1.39*  CALCIUM 8.6*  --   AST 35  --   ALT 47  --   ALKPHOS 81  --   BILITOT 0.8  --    ------------------------------------------------------------------------------------------------------------------  Cardiac Enzymes  Recent Labs Lab 05/03/16 1631  TROPONINI 0.06*   ------------------------------------------------------------------------------------------------------------------  RADIOLOGY:  Dg Chest 2 View  Result Date: 05/03/2016 CLINICAL DATA:  Shortness of breath with fever. EXAM: CHEST  2 VIEW COMPARISON:  04/27/2016 FINDINGS: The heart size and mediastinal contours are within normal limits. Both lungs are clear. The visualized skeletal structures are unremarkable. IMPRESSION: No active cardiopulmonary disease. Electronically Signed   By: Kennith Center M.D.   On: 05/03/2016 17:37   Dg Foot Complete Right  Result Date: 05/03/2016 CLINICAL DATA:  Assess right lateral foot ulceration. Current history of diabetes. Initial encounter. EXAM: RIGHT FOOT COMPLETE - 3+ VIEW COMPARISON:  None. FINDINGS: There is no evidence of fracture  or dislocation. There is no evidence of osseous erosion. The joint spaces are preserved. There is no evidence of talar subluxation; the  subtalar joint is unremarkable in appearance. Small plantar and posterior calcaneal spurs are seen. The known soft tissue ulceration is not well characterized on radiograph. No radiopaque foreign bodies are seen. IMPRESSION: Known soft tissue ulceration not well characterized on radiograph. No radiopaque foreign bodies seen. No evidence of osseous erosion. Electronically Signed   By: Roanna RaiderJeffery  Chang M.D.   On: 05/03/2016 17:42    ASSESSMENT AND PLAN:   Active Problems:   Right foot ulcer (HCC)   Damon [name]  is a 66 y.o. male with a known history of Peripheral vascular disease status post stenting and PTCA in July 2017 right common iliac left common iliac and posterior tibial artery, ongoing tobacco abuse, hypertension, history of CKD stage III and COPD comes to the emergency room with fever, generalized weakness and foul-smelling ulcer on the right foot which appears dry and has darkened skin around.   1. Right foot nonhealing dry gangrene/ulcer with foul-smelling -Patient presented with fever of 100.7, generalized malaise and weakness and tested negative for flu. -He was started recently on Keflex, changed to IV Zosyn -Follow blood cultures -Vascular surgery consult. Patient is scheduled to have angiogram of his lower extremity on Monday -Continue his aspirin  2. History of COPD however does not have any cough or wheezing -Continue oral inhalers and nebs  3. Hypertension continue home meds  4. Tobacco abuse discussed smoking cessation more than 4 minutes spent.  5. Diabetes   Had hypoglycemia, required Inj D50 05/03/16- so stopped lantus. On Sliding scale.  6. CKD stage II. Stable.  All the records are reviewed and case discussed with Care Management/Social Workerr. Management plans discussed with the patient, his wife and they are in agreement.  CODE STATUS: Full.  TOTAL TIME TAKING CARE OF THIS PATIENT: 35 minutes.     POSSIBLE D/C IN 2-3 DAYS, DEPENDING ON CLINICAL  CONDITION.   [name], Ellawyn Wogan M.D on 05/05/2016   Between 7am to 6pm - Pager - [phone]  After 6pm go to [email or web address] - password [facility] [email or web address]  [facility] [place] [facility]  Office  [phone]  CC: Primary care physician; Emogene MorganAYCOCK, [name], MD  Note: This dictation was prepared with Dragon dictation along with smaller phrase technology. Any transcriptional errors that result from this process are unintentional.

## 2016-05-06 LAB — CBC
HEMATOCRIT: 23.7 % — AB (ref 40.0–52.0)
Hemoglobin: 8.3 g/dL — ABNORMAL LOW (ref 13.0–18.0)
MCH: 31.9 pg (ref 26.0–34.0)
MCHC: 34.9 g/dL (ref 32.0–36.0)
MCV: 91.3 fL (ref 80.0–100.0)
Platelets: 411 10*3/uL (ref 150–440)
RBC: 2.6 MIL/uL — AB (ref 4.40–5.90)
RDW: 14.9 % — AB (ref 11.5–14.5)
WBC: 16.2 10*3/uL — AB (ref 3.8–10.6)

## 2016-05-06 LAB — VANCOMYCIN, TROUGH
VANCOMYCIN TR: 18 ug/mL (ref 15–20)
Vancomycin Tr: 24 ug/mL (ref 15–20)

## 2016-05-06 LAB — GLUCOSE, CAPILLARY
GLUCOSE-CAPILLARY: 225 mg/dL — AB (ref 65–99)
Glucose-Capillary: 174 mg/dL — ABNORMAL HIGH (ref 65–99)
Glucose-Capillary: 206 mg/dL — ABNORMAL HIGH (ref 65–99)
Glucose-Capillary: 186 mg/dL — ABNORMAL HIGH (ref 65–99)
Glucose-Capillary: 172 mg/dL — ABNORMAL HIGH (ref 65–99)

## 2016-05-06 LAB — CREATININE, SERUM
CREATININE: 1.01 mg/dL (ref 0.61–1.24)
GFR calc Af Amer: >60 mL/min (ref >60)

## 2016-05-06 LAB — AEROBIC CULTURE W GRAM STAIN (SUPERFICIAL SPECIMEN)

## 2016-05-06 LAB — BUN: BUN: 20 mg/dL (ref 6–20)

## 2016-05-06 LAB — AEROBIC CULTURE  (SUPERFICIAL SPECIMEN)

## 2016-05-06 MED ORDER — VANCOMYCIN HCL IN DEXTROSE 750-5 MG/150ML-% IV SOLN: 750.0000 mg | INTRAVENOUS | Status: DC

## 2016-05-06 MED ORDER — CEFAZOLIN IN D5W 1 GM/50ML IV SOLN
1.0000 g | Freq: Once | INTRAVENOUS | Status: AC
  Administered 2016-05-07: 1 g via INTRAVENOUS
  Filled 2016-05-06: qty 50

## 2016-05-06 MED ORDER — FAMOTIDINE 20 MG PO TABS: 40.0000 mg | ORAL_TABLET | ORAL | Status: DC | PRN

## 2016-05-06 MED ORDER — CIPROFLOXACIN IN D5W 400 MG/200ML IV SOLN
400.0000 mg | Freq: Two times a day (BID) | INTRAVENOUS | Status: DC
  Administered 2016-05-06 – 2016-05-08 (×4): 400 mg via INTRAVENOUS
  Filled 2016-05-06 (×6): qty 200

## 2016-05-06 MED ORDER — SODIUM CHLORIDE 0.9 % IV SOLN
INTRAVENOUS | Status: DC
  Administered 2016-05-07: 08:00:00 via INTRAVENOUS

## 2016-05-06 MED ORDER — METHYLPREDNISOLONE SODIUM SUCC 125 MG IJ SOLR: 125.0000 mg | INTRAMUSCULAR | Status: DC | PRN

## 2016-05-06 MED ORDER — INSULIN GLARGINE 100 UNIT/ML ~~LOC~~ SOLN
7.0000 [IU] | Freq: Every day | SUBCUTANEOUS | Status: DC
  Administered 2016-05-06: 7 [IU] via SUBCUTANEOUS
  Filled 2016-05-06 (×2): qty 0.07

## 2016-05-06 MED ORDER — VANCOMYCIN HCL IN DEXTROSE 750-5 MG/150ML-% IV SOLN
750.0000 mg | Freq: Two times a day (BID) | INTRAVENOUS | Status: DC
  Filled 2016-05-06 (×2): qty 150

## 2016-05-06 NOTE — Progress Notes (Signed)
Pharmacy Antibiotic Note  Damon [name] is a 66 y.o. male admitted on 05/03/2016 with right foot ulcer.  Pharmacy has been consulted for vancomycin dosing. Pt also on Zosyn.   Plan: Vancomycin 750 mg IV every 12 hours.  Goal trough 15-20 mcg/mL.  Vanc level ordered before 4th dose.  SCr ordered for AM, will need to closely follow renal function as pt with hx of CKD and also on Zosyn.   ke 0.056, half life 12.4, Vd 50.8 L  Height: 5\' 10"  (177.8 cm) Weight: 160 lb (72.6 kg) IBW/kg (Calculated) : 73  Temp (24hrs), Avg:99.7 F (37.6 C), Min:98 F (36.7 C), Max:101.2 F (38.4 C)   Recent Labs Lab 05/03/16 1631 05/05/16 0353 05/06/16 0325  WBC 17.0*  --  16.2*  CREATININE 1.24 1.39*  --   LATICACIDVEN 1.7  --   --   VANCOTROUGH  --   --  24*    Estimated Creatinine Clearance: 55.1 mL/min (by C-G formula based on SCr of 1.39 mg/dL (H)).    No Known Allergies  Antimicrobials this admission: Vancomycin Damon [name]/Zosyn 1/25 >>  Dose adjustments this admission: 0128 0330 vanc level 24, apparently drawn while dose was infusing. Will reorder trough before next scheduled dose.  Microbiology results: 1/25 BCx: NGTD 1/25 WCx pending  Thank you for allowing pharmacy to be a part of this patient'[name] care.  Damon [name] 05/06/2016 4:15 AM

## 2016-05-06 NOTE — Progress Notes (Addendum)
[employer] - [place] at [facility]   PATIENT NAME: Damon [name]    MR#:  [id number]  DATE OF BIRTH:  August 02, 1950  SUBJECTIVE:  CHIEF COMPLAINT:   Chief Complaint  Patient presents with  . Shortness of Breath     Have vascular problems, s/p previous procedures. Came with ulcers and infection.   No complains. Hb down to 8.3. No Melena or bloody stool or hematuria.  REVIEW OF SYSTEMS:  CONSTITUTIONAL: No fever, fatigue or weakness.  EYES: No blurred or double vision.  EARS, NOSE, AND THROAT: No tinnitus or ear pain.  RESPIRATORY: No cough, shortness of breath, wheezing or hemoptysis.  CARDIOVASCULAR: No chest pain, orthopnea, edema.  GASTROINTESTINAL: No nausea, vomiting, diarrhea or abdominal pain.  GENITOURINARY: No dysuria, hematuria.  ENDOCRINE: No polyuria, nocturia,  HEMATOLOGY: No anemia, easy bruising or bleeding SKIN: ulcer on right foot, rash or lesion. MUSCULOSKELETAL: No joint pain or arthritis.   NEUROLOGIC: No tingling, numbness, weakness.  PSYCHIATRY: No anxiety or depression.   ROS  DRUG ALLERGIES:  No Known Allergies  VITALS:  Blood pressure (!) 168/70, pulse 88, temperature 99.8 F (37.7 C), temperature source Oral, resp. rate 18, height 5\' 10"  (1.778 m), weight 160 lb (72.6 kg), SpO2 97 %.  PHYSICAL EXAMINATION:  GENERAL:  66 y.o.-year-old patient lying in the bed with no acute distress.  EYES: Pupils equal, round, reactive to light and accommodation. No scleral icterus. Extraocular muscles intact.  HEENT: Head atraumatic, normocephalic. Oropharynx and nasopharynx clear.  NECK:  Supple, no jugular venous distention. No thyroid enlargement, no tenderness.  LUNGS: Normal breath sounds bilaterally, no wheezing, rales,rhonchi or crepitation. No use of accessory muscles of respiration.  CARDIOVASCULAR: S1, S2 normal. No murmurs, rubs, or gallops.  ABDOMEN: Soft, nontender, nondistended. Bowel sounds present. No organomegaly or mass.   EXTREMITIES: No pedal edema, cyanosis, or clubbing. Bilateral lower extremity edema and dry gangrene ulcer on the right foot lateral wall in dressing. NEUROLOGIC: Cranial nerves II through XII are intact. Muscle strength 5/5 in all extremities. Sensation intact. Gait not checked.  PSYCHIATRIC: The patient is alert and oriented x 3.  SKIN: No obvious rash, lesion, or ulcer.    Physical Exam LABORATORY PANEL:   CBC  Recent Labs Lab 05/06/16 0325  WBC 16.2*  HGB 8.3*  HCT 23.7*  PLT 411   ------------------------------------------------------------------------------------------------------------------  Chemistries   Recent Labs Lab 05/03/16 1631 05/05/16 0353  NA 134*  --   K 4.3  --   CL 102  --   CO2 22  --   GLUCOSE 52*  --   BUN 21*  --   CREATININE 1.24 1.39*  CALCIUM 8.6*  --   AST 35  --   ALT 47  --   ALKPHOS 81  --   BILITOT 0.8  --    ------------------------------------------------------------------------------------------------------------------  Cardiac Enzymes  Recent Labs Lab 05/03/16 1631  TROPONINI 0.06*   ------------------------------------------------------------------------------------------------------------------  RADIOLOGY:  No results found.  ASSESSMENT AND PLAN:   Active Problems:   Right foot ulcer (HCC)   Damon [name]  is [name] 66 y.o. male with [name] known history of Peripheral vascular disease status post stenting and PTCA in July 2017 right common iliac left common iliac and posterior tibial artery, ongoing tobacco abuse, hypertension, history of CKD stage III and COPD comes to the emergency room with fever, generalized weakness and foul-smelling ulcer on the right foot which appears dry and has darkened skin around.   1. Right foot nonhealing  dry gangrene/ulcer with foul-smelling -Patient presented with fever of 100.7, generalized malaise and weakness and tested negative for flu. -He was started recently on Keflex, changed to IV  Zosyn Wound culture showed MODERATE ENTEROBACTER CLOACAE  Blood culture is negative so far. Changed to cipro per sensitivity. Discontinue vancomycin. -Vascular surgery consult. Patient is scheduled to have angiogram of his lower extremity on Monday -Continue his aspirin  2. History of COPD however does not have any cough or wheezing -Continue oral inhalers and nebs  3. Hypertension continue home meds  4. Tobacco abuse discussed smoking cessation more than 4 minutes spent.  5. Diabetes   Had hypoglycemia, required Inj D50 05/03/16- so stopped lantus. On Sliding scale. BS is elevated, start Lantus 7 units at bedtime.  6. CKD stage II. Stable.  Anemia, possible due to chronic disease. Follow-up stool occult.  All the records are reviewed and case discussed with Care Management/Social Workerr. Management plans discussed with the patient, his wife and they are in agreement.  CODE STATUS: Full.  TOTAL TIME TAKING CARE OF THIS PATIENT: 35 minutes.     POSSIBLE D/C IN 2-3 DAYS, DEPENDING ON CLINICAL CONDITION.   Damon [name], Damon [name] M.D on 05/06/2016   Between 7am to 6pm - Pager - [phone]  After 6pm go to [email or web address] - password [employer] [email or web address]  [facility] [place] [facility]  Office  [phone]  CC: Primary care physician; Damon [name], Damon [name]  Note: This dictation was prepared with Dragon dictation along with smaller phrase technology. Any transcriptional errors that result from this process are unintentional.

## 2016-05-06 NOTE — Progress Notes (Signed)
Pharmacy Antibiotic Note  Damon [name] is a 66 y.o. male admitted on 05/03/2016 with Right foot ulcer/wound infection.  Cultures growing MODERATE ENTEROBACTER CLOACAE  And FEW GROUP B STREP.  Pharmacy has been consulted for Ciprofloxacin dosing. Zosyn and vancomycin have been discontinued.   Plan: Will start patient on ciprofloxacin 400mg  IV every 12 hours.   Height: 5\' 10"  (177.8 cm) Weight: 160 lb (72.6 kg) IBW/kg (Calculated) : 73  Temp (24hrs), Avg:99.9 F (37.7 C), Min:98.7 F (37.1 C), Max:101.2 F (38.4 C)   Recent Labs Lab 05/03/16 1631 05/05/16 0353 05/06/16 0325  WBC 17.0*  --  16.2*  CREATININE 1.24 1.39*  --   LATICACIDVEN 1.7  --   --   VANCOTROUGH  --   --  24*    Estimated Creatinine Clearance: 55.1 mL/min (by C-G formula based on SCr of 1.39 mg/dL (H)).    No Known Allergies  Antimicrobials this admission: 1/25 vancomycin  >> 1/28 1/26 Zosyn >> 1/28 1/28 ciprofloxacin>>  Dose adjustments this admission:   Microbiology results: Wound NW:[id number]:MODERATE ENTEROBACTER CLOACAE  FEW GROUP B STREP 1/25 BCx: NG x 3 days  Thank you for allowing pharmacy to be a part of this patient's care.  Gardner CandleSheema M Diante Barley, PharmD, BCPS Clinical Pharmacist 05/06/2016 4:59 PM

## 2016-05-07 ENCOUNTER — Encounter
Admission: EM | Disposition: A | Discharge status: Skilled Nursing Facility | Payer: Self-pay | Source: Home / Self Care | Attending: Internal Medicine

## 2016-05-07 ENCOUNTER — Telehealth: Payer: Self-pay | Admitting: Cardiovascular Disease

## 2016-05-07 ENCOUNTER — Ambulatory Visit
Admission: RE | Admit: 2016-05-07 | Payer: Commercial Managed Care - PPO | Source: Ambulatory Visit | Admitting: Vascular Surgery

## 2016-05-07 DIAGNOSIS — I70238 Atherosclerosis of native arteries of right leg with ulceration of other part of lower right leg: Secondary | ICD-10-CM

## 2016-05-07 HISTORY — PX: PERIPHERAL VASCULAR CATHETERIZATION: SHX172C

## 2016-05-07 LAB — BASIC METABOLIC PANEL
ANION GAP: 6 (ref 5–15)
BUN: 20 mg/dL (ref 6–20)
CALCIUM: 8 mg/dL — AB (ref 8.9–10.3)
CO2: 22 mmol/L (ref 22–32)
CREATININE: 1.31 mg/dL — AB (ref 0.61–1.24)
Chloride: 104 mmol/L (ref 101–111)
GFR calc non Af Amer: 56 mL/min — ABNORMAL LOW (ref >60)
Glucose, Bld: 154 mg/dL — ABNORMAL HIGH (ref 65–99)
Potassium: 3.9 mmol/L (ref 3.5–5.1)
SODIUM: 132 mmol/L — AB (ref 135–145)

## 2016-05-07 LAB — GLUCOSE, CAPILLARY
GLUCOSE-CAPILLARY: 196 mg/dL — AB (ref 65–99)
Glucose-Capillary: 26 mg/dL — CL (ref 65–99)
Glucose-Capillary: 162 mg/dL — ABNORMAL HIGH (ref 65–99)
Glucose-Capillary: 201 mg/dL — ABNORMAL HIGH (ref 65–99)
Glucose-Capillary: 173 mg/dL — ABNORMAL HIGH (ref 65–99)

## 2016-05-07 LAB — CBC
HCT: 22.8 % — ABNORMAL LOW (ref 40.0–52.0)
Hemoglobin: 7.9 g/dL — ABNORMAL LOW (ref 13.0–18.0)
MCH: 31.7 pg (ref 26.0–34.0)
MCHC: 34.7 g/dL (ref 32.0–36.0)
MCV: 91.2 fL (ref 80.0–100.0)
Platelets: 449 10*3/uL — ABNORMAL HIGH (ref 150–440)
RBC: 2.5 MIL/uL — ABNORMAL LOW (ref 4.40–5.90)
RDW: 14.6 % — ABNORMAL HIGH (ref 11.5–14.5)
WBC: 16.5 10*3/uL — ABNORMAL HIGH (ref 3.8–10.6)

## 2016-05-07 SURGERY — LOWER EXTREMITY ANGIOGRAPHY
Anesthesia: Moderate Sedation | Site: Leg Lower | Laterality: Right

## 2016-05-07 MED ORDER — MIDAZOLAM HCL 2 MG/2ML IJ SOLN
INTRAMUSCULAR | Status: AC
  Filled 2016-05-07: qty 2

## 2016-05-07 MED ORDER — FENTANYL CITRATE (PF) 100 MCG/2ML IJ SOLN
INTRAMUSCULAR | Status: DC | PRN
  Administered 2016-05-07: 25 ug via INTRAVENOUS
  Administered 2016-05-07: 50 ug via INTRAVENOUS
  Administered 2016-05-07 (×3): 25 ug via INTRAVENOUS

## 2016-05-07 MED ORDER — ONDANSETRON HCL 4 MG/2ML IJ SOLN: 4.0000 mg | Freq: Four times a day (QID) | INTRAMUSCULAR | Status: DC | PRN

## 2016-05-07 MED ORDER — ONDANSETRON HCL 4 MG/2ML IJ SOLN
4.0000 mg | Freq: Four times a day (QID) | INTRAMUSCULAR | Status: DC | PRN
  Administered 2016-05-08: 4 mg via INTRAVENOUS

## 2016-05-07 MED ORDER — ACETAMINOPHEN 325 MG PO TABS: 325.0000 mg | ORAL_TABLET | ORAL | Status: DC | PRN

## 2016-05-07 MED ORDER — IOPAMIDOL (ISOVUE-300) INJECTION 61%
INTRAVENOUS | Status: DC | PRN
  Administered 2016-05-07: 60 mL via INTRAVENOUS

## 2016-05-07 MED ORDER — GUAIFENESIN-DM 100-10 MG/5ML PO SYRP
15.0000 mL | ORAL_SOLUTION | ORAL | Status: DC | PRN
  Filled 2016-05-07: qty 15

## 2016-05-07 MED ORDER — FENTANYL CITRATE (PF) 100 MCG/2ML IJ SOLN
INTRAMUSCULAR | Status: AC
  Filled 2016-05-07: qty 2

## 2016-05-07 MED ORDER — SODIUM CHLORIDE 0.9 % IV SOLN: 500.0000 mL | Freq: Once | INTRAVENOUS | Status: DC | PRN

## 2016-05-07 MED ORDER — OXYCODONE-ACETAMINOPHEN 5-325 MG PO TABS: 1.0000 | ORAL_TABLET | ORAL | Status: DC | PRN

## 2016-05-07 MED ORDER — LABETALOL HCL 5 MG/ML IV SOLN
10.0000 mg | INTRAVENOUS | Status: DC | PRN
  Filled 2016-05-07: qty 4

## 2016-05-07 MED ORDER — CEFAZOLIN IN D5W 1 GM/50ML IV SOLN
INTRAVENOUS | Status: AC
  Filled 2016-05-07: qty 50

## 2016-05-07 MED ORDER — LACTULOSE 10 GM/15ML PO SOLN
20.0000 g | Freq: Two times a day (BID) | ORAL | Status: DC | PRN
  Administered 2016-05-10: 20 g via ORAL
  Filled 2016-05-07: qty 30

## 2016-05-07 MED ORDER — HYDROMORPHONE HCL 1 MG/ML IJ SOLN
0.5000 mg | INTRAMUSCULAR | Status: DC | PRN
  Administered 2016-05-10: 1 mg via INTRAVENOUS
  Filled 2016-05-07: qty 1

## 2016-05-07 MED ORDER — MIDAZOLAM HCL 2 MG/2ML IJ SOLN
INTRAMUSCULAR | Status: DC | PRN
  Administered 2016-05-07 (×2): 1 mg via INTRAVENOUS
  Administered 2016-05-07: 2 mg via INTRAVENOUS
  Administered 2016-05-07 (×2): 1 mg via INTRAVENOUS

## 2016-05-07 MED ORDER — CARVEDILOL 3.125 MG PO TABS
6.2500 mg | ORAL_TABLET | Freq: Two times a day (BID) | ORAL | Status: DC
  Administered 2016-05-07 – 2016-05-10 (×7): 6.25 mg via ORAL
  Filled 2016-05-07 (×7): qty 2

## 2016-05-07 MED ORDER — HYDROMORPHONE HCL 1 MG/ML IJ SOLN: 1.0000 mg | Freq: Once | INTRAMUSCULAR | Status: DC

## 2016-05-07 MED ORDER — METOPROLOL TARTRATE 5 MG/5ML IV SOLN: 2.0000 mg | INTRAVENOUS | Status: DC | PRN

## 2016-05-07 MED ORDER — PHENOL 1.4 % MT LIQD: 1.0000 | OROMUCOSAL | Status: DC | PRN

## 2016-05-07 MED ORDER — LIDOCAINE-EPINEPHRINE (PF) 2 %-1:200000 IJ SOLN
INTRAMUSCULAR | Status: AC
  Filled 2016-05-07: qty 20

## 2016-05-07 MED ORDER — DIPHENHYDRAMINE HCL 50 MG/ML IJ SOLN
INTRAMUSCULAR | Status: AC
  Filled 2016-05-07: qty 1

## 2016-05-07 MED ORDER — HEPARIN (PORCINE) IN NACL 2-0.9 UNIT/ML-% IJ SOLN
INTRAMUSCULAR | Status: AC
  Filled 2016-05-07: qty 1000

## 2016-05-07 MED ORDER — HYDRALAZINE HCL 20 MG/ML IJ SOLN: 5.0000 mg | INTRAMUSCULAR | Status: DC | PRN

## 2016-05-07 MED ORDER — MIDAZOLAM HCL 5 MG/5ML IJ SOLN
INTRAMUSCULAR | Status: AC
  Filled 2016-05-07: qty 5

## 2016-05-07 MED ORDER — AMOXICILLIN-POT CLAVULANATE 875-125 MG PO TABS
1.0000 | ORAL_TABLET | Freq: Two times a day (BID) | ORAL | Status: DC
  Administered 2016-05-07 (×2): 1 via ORAL
  Filled 2016-05-07 (×3): qty 1

## 2016-05-07 MED ORDER — INSULIN GLARGINE 100 UNIT/ML ~~LOC~~ SOLN
10.0000 [IU] | Freq: Every day | SUBCUTANEOUS | Status: DC
  Administered 2016-05-07 – 2016-05-10 (×4): 10 [IU] via SUBCUTANEOUS
  Filled 2016-05-07 (×5): qty 0.1

## 2016-05-07 MED ORDER — DIPHENHYDRAMINE HCL 50 MG/ML IJ SOLN
INTRAMUSCULAR | Status: DC | PRN
  Administered 2016-05-07: 50 mg via INTRAVENOUS

## 2016-05-07 MED ORDER — HEPARIN SODIUM (PORCINE) 1000 UNIT/ML IJ SOLN
INTRAMUSCULAR | Status: DC | PRN
  Administered 2016-05-07: 5000 [IU] via INTRAVENOUS

## 2016-05-07 MED ORDER — HEPARIN SODIUM (PORCINE) 1000 UNIT/ML IJ SOLN
INTRAMUSCULAR | Status: AC
  Filled 2016-05-07: qty 1

## 2016-05-07 MED ORDER — ACETAMINOPHEN 325 MG RE SUPP
325.0000 mg | RECTAL | Status: DC | PRN
  Filled 2016-05-07: qty 2

## 2016-05-07 SURGICAL SUPPLY — 21 items
BALLN LUTONIX 4X150X130 (BALLOONS) ×3
BALLN LUTONIX 5X150X130 (BALLOONS) ×3
BALLN ULTRVRSE 3X220X150 (BALLOONS) ×3
BALLOON LUTONIX 4X150X130 (BALLOONS) IMPLANT
BALLOON LUTONIX 5X150X130 (BALLOONS) IMPLANT
BALLOON ULTRVRSE 3X220X150 (BALLOONS) IMPLANT
CATH PIG 70CM (CATHETERS) ×3 IMPLANT
CATH TORCON 5FR 0.38 (CATHETERS) ×2 IMPLANT
CATH VERT 100CM (CATHETERS) ×2 IMPLANT
DEVICE PRESTO INFLATION (MISCELLANEOUS) ×2 IMPLANT
DEVICE STARCLOSE SE CLOSURE (Vascular Products) ×2 IMPLANT
GLIDEWIRE ADV .035X260CM (WIRE) ×2 IMPLANT
PACK ANGIOGRAPHY (CUSTOM PROCEDURE TRAY) ×3 IMPLANT
SHEATH BRITE TIP 5FRX11 (SHEATH) ×3 IMPLANT
SHEATH HIGHFLEX ANSEL 6FRX55 (SHEATH) ×2 IMPLANT
STENT VIABAHN 5X150X120 (Permanent Stent) ×2 IMPLANT
STENT VIABAHN 5X250X120 (Permanent Stent) ×2 IMPLANT
SYR MEDRAD MARK V 150ML (SYRINGE) ×3 IMPLANT
TUBING CONTRAST HIGH PRESS 72 (TUBING) ×3 IMPLANT
WIRE G V18X300CM (WIRE) ×2 IMPLANT
WIRE J 3MM .035X145CM (WIRE) ×3 IMPLANT

## 2016-05-07 NOTE — Progress Notes (Addendum)
[employer] - [place] at [facility]   PATIENT NAME: Damon [name]    MR#:  [id number]  DATE OF BIRTH:  1950-05-23  SUBJECTIVE:  CHIEF COMPLAINT:   Chief Complaint  Patient presents with  . Shortness of Breath     Have vascular problems, s/p previous procedures. Came with ulcers and infection.   No complains. S/p angiogram today. He has low grade fever this am.  REVIEW OF SYSTEMS:  CONSTITUTIONAL: No fever, fatigue or weakness.  EYES: No blurred or double vision.  EARS, NOSE, AND THROAT: No tinnitus or ear pain.  RESPIRATORY: No cough, shortness of breath, wheezing or hemoptysis.  CARDIOVASCULAR: No chest pain, orthopnea, edema.  GASTROINTESTINAL: No nausea, vomiting, diarrhea or abdominal pain.  GENITOURINARY: No dysuria, hematuria.  ENDOCRINE: No polyuria, nocturia,  HEMATOLOGY: No anemia, easy bruising or bleeding SKIN: ulcer on right foot, rash or lesion. MUSCULOSKELETAL: No joint pain or arthritis.   NEUROLOGIC: No tingling, numbness, weakness.  PSYCHIATRY: No anxiety or depression.   ROS  DRUG ALLERGIES:  No Known Allergies  VITALS:  Blood pressure (!) 162/72, pulse 91, temperature 100.2 F (37.9 C), temperature source Oral, resp. rate 16, height 5\' 10"  (1.778 m), weight 160 lb (72.6 kg), SpO2 90 %.  PHYSICAL EXAMINATION:  GENERAL:  66 y.o.-year-old patient lying in the bed with no acute distress.  EYES: Pupils equal, round, reactive to light and accommodation. No scleral icterus. Extraocular muscles intact.  HEENT: Head atraumatic, normocephalic. Oropharynx and nasopharynx clear.  NECK:  Supple, no jugular venous distention. No thyroid enlargement, no tenderness.  LUNGS: Normal breath sounds bilaterally, no wheezing, rales,rhonchi or crepitation. No use of accessory muscles of respiration.  CARDIOVASCULAR: S1, S2 normal. No murmurs, rubs, or gallops.  ABDOMEN: Soft, nontender, nondistended. Bowel sounds present. No organomegaly or mass.  EXTREMITIES:  No pedal edema, cyanosis, or clubbing. Bilateral lower extremity edema and dry gangrene ulcer on the right foot lateral wall in dressing. NEUROLOGIC: Cranial nerves II through XII are intact. Muscle strength 5/5 in all extremities. Sensation intact. Gait not checked.  PSYCHIATRIC: The patient is alert and oriented x 3.  SKIN: No obvious rash, lesion, or ulcer.    Physical Exam LABORATORY PANEL:   CBC  Recent Labs Lab 05/07/16 0423  WBC 16.5*  HGB 7.9*  HCT 22.8*  PLT 449*   ------------------------------------------------------------------------------------------------------------------  Chemistries   Recent Labs Lab 05/03/16 1631  05/07/16 0423  NA 134*  --  132*  K 4.3  --  3.9  CL 102  --  104  CO2 22  --  22  GLUCOSE 52*  --  154*  BUN 21*  < > 20  CREATININE 1.24  < > 1.31*  CALCIUM 8.6*  --  8.0*  AST 35  --   --   ALT 47  --   --   ALKPHOS 81  --   --   BILITOT 0.8  --   --   < > = values in this interval not displayed. ------------------------------------------------------------------------------------------------------------------  Cardiac Enzymes  Recent Labs Lab 05/03/16 1631  TROPONINI 0.06*   ------------------------------------------------------------------------------------------------------------------  RADIOLOGY:  No results found.  ASSESSMENT AND PLAN:   Active Problems:   Right foot ulcer (HCC)   Damon [name]  is a 66 y.o. male with a known history of Peripheral vascular disease status post stenting and PTCA in July 2017 right common iliac left common iliac and posterior tibial artery, ongoing tobacco abuse, hypertension, history of CKD stage III and COPD  comes to the emergency room with fever, generalized weakness and foul-smelling ulcer on the right foot which appears dry and has darkened skin around.   1. Right foot nonhealing dry gangrene/ulcer with foul-smelling -Patient presented with fever of 100.7, generalized malaise and  weakness and tested negative for flu. -He was started recently on Keflex, changed to IV Zosyn negative blood cultures so far.  Wound culture showed MODERATE ENTEROBACTER CLOACAE and FEW GROUP B STREP(S.AGALACTIAE)  Discontinued vancomycin. Change to Cipro and Augmentin.  PAD.  He got angiogram and Percutaneous transluminal angioplasty of right lower extremity today. -Continue Brilinta and aspirin  2. History of COPD. Stable. -Continue oral inhalers and nebs  3. Hypertension continue home meds  4. Tobacco abuse discussed smoking cessation more than 4 minutes spent.  5. Diabetes   Had hypoglycemia, required Inj D50 05/03/16- so stopped lantus. On Sliding scale. BS is elevated, increase Lantus to 10 units at bedtime.  6. CKD stage II. Stable.  Anemia, possible due to chronic disease. Follow-up stool occult. Hyponatremia. Start normal saline IV. Follow up BMP.  All the records are reviewed and case discussed with Care Management/Social Workerr. Management plans discussed with the patient, his wife and they are in agreement.  CODE STATUS: Full.  TOTAL TIME TAKING CARE OF THIS PATIENT: 35 minutes.     POSSIBLE D/C IN 2-3 DAYS, DEPENDING ON CLINICAL CONDITION.   Shaune Pollack M.D on 05/07/2016   Between 7am to 6pm - Pager - [phone]  After 6pm go to [email or web address] - password [employer]  [facility] [place] [facility]  Office  [phone]  CC: Primary care physician; Emogene Morgan, MD  Note: This dictation was prepared with Dragon dictation along with smaller phrase technology. Any transcriptional errors that result from this process are unintentional.

## 2016-05-07 NOTE — Progress Notes (Signed)
Patient back from angiogram. Alert and oriented but drowsy. Patient not complaining of pain at this time. Gauze dressing on left groin with minimal serosanguinous drainage. Patient flat until 1210.Patient told to stay in bed until 1240.   Harvie HeckMelanie Chayce Robbins, RN

## 2016-05-07 NOTE — Telephone Encounter (Signed)
Sent Spouse FMLA forms roi and $25 check fee to [employer].

## 2016-05-07 NOTE — Consult Note (Signed)
Reason for Consult: Chronic ulceration with gangrenous changes right foot Referring Physician: Hospitalist  Damon [name] is an 66 y.o. male.  HPI: The patient is known to me outpatient where he has been seen previously for an ulceration on his right lower extremity. He was set up for an outpatient vascular exam which was scheduled for today. In the interim over the weekend he was admitted for some fever and chills with infection and gangrenous changes on his right foot. Underwent revascularization procedure earlier today.  Past Medical History:  Diagnosis Date  . CKD (chronic kidney disease), stage III   . COPD (chronic obstructive pulmonary disease) ([place])   . Diabetes mellitus without complication ([place])   . Diverticulitis   . Essential hypertension   . MI (myocardial infarction)   . PAD (peripheral artery disease) ([place])    a. 10/2015 s/p PTA and stenting of the RCIA, LCIA, and PTA of the LE[facility] [employer]).  . Tobacco abuse     Past Surgical History:  Procedure Laterality Date  . CARDIAC CATHETERIZATION N/A 01/13/2016   Procedure: LEFT HEART CATH AND CORONARY ANGIOGRAPHY;  Surgeon: Wellington Hampshire, MD;  Location: [facility];  Service: Cardiovascular;  Laterality: N/A;  . CARDIAC CATHETERIZATION N/A 01/13/2016   Procedure: Coronary Stent Intervention;  Surgeon: Wellington Hampshire, MD;  Location: [facility];  Service: Cardiovascular;  Laterality: N/A;  . CARDIAC CATHETERIZATION N/A 01/16/2016   Procedure: Left Heart Cath and Coronary Angiography;  Surgeon: Wellington Hampshire, MD;  Location: [facility];  Service: Cardiovascular;  Laterality: N/A;  . NECK SURGERY    . PERIPHERAL VASCULAR CATHETERIZATION Left 11/01/2015   Procedure: Lower Extremity Angiography;  Surgeon: Katha Cabal, MD;  Location: [facility];  Service: Cardiovascular;  Laterality: Left;    Family History  Problem Relation Age of Onset  . Intracerebral hemorrhage Mother   . Diabetes  Mother   . Cancer Mother   . Diabetes Father     Social History:  reports that he has been smoking Cigarettes.  He has a 7.50 pack-year smoking history. He uses smokeless tobacco. He reports that he does not drink alcohol or use drugs.  Allergies: No Known Allergies  Medications:  Scheduled: . amoxicillin-clavulanate  1 tablet Oral Q12H  . ARIPiprazole  15 mg Oral Daily  . aspirin EC  81 mg Oral Daily  . atorvastatin  80 mg Oral q1800  . carvedilol  6.25 mg Oral BID WC  . ciprofloxacin  400 mg Intravenous Q12H  . enoxaparin (LOVENOX) injection  40 mg Subcutaneous Q24H  . gabapentin  300 mg Oral Daily  .  HYDROmorphone (DILAUDID) injection  1 mg Intravenous Once  . insulin aspart  0-9 Units Subcutaneous TID WC  . insulin glargine  10 Units Subcutaneous QHS  . senna  1 tablet Oral BID  . ticagrelor  90 mg Oral BID    Results for orders placed or performed during the hospital encounter of 05/03/16 (from the past 48 hour(s))  Glucose, capillary     Status: Abnormal   Collection Time: 05/05/16  9:38 PM  Result Value Ref Range   Glucose-Capillary 143 (H) 65 - 99 mg/dL   Comment 1 Notify RN   Vancomycin, trough     Status: Abnormal   Collection Time: 05/06/16  3:25 AM  Result Value Ref Range   Vancomycin Tr 24 (HH) 15 - 20 ug/mL    Comment: CRITICAL RESULT CALLED TO, READ BACK  BY AND VERIFIED WITH MATT MCBANE AT [place] 05/06/16.PMH  CBC     Status: Abnormal   Collection Time: 05/06/16  3:25 AM  Result Value Ref Range   WBC 16.2 (H) 3.8 - 10.6 K/uL   RBC 2.60 (L) 4.40 - 5.90 MIL/uL   Hemoglobin 8.3 (L) 13.0 - 18.0 g/dL   HCT 23.7 (L) 40.0 - 52.0 %   MCV 91.3 80.0 - 100.0 fL   MCH 31.9 26.0 - 34.0 pg   MCHC 34.9 32.0 - 36.0 g/dL   RDW 14.9 (H) 11.5 - 14.5 %   Platelets 411 150 - 440 K/uL  Glucose, capillary     Status: Abnormal   Collection Time: 05/06/16  7:37 AM  Result Value Ref Range   Glucose-Capillary 174 (H) 65 - 99 mg/dL   Comment 1 Notify RN    Comment 2 Document in  Chart   Glucose, capillary     Status: Abnormal   Collection Time: 05/06/16 11:03 AM  Result Value Ref Range   Glucose-Capillary 225 (H) 65 - 99 mg/dL  Glucose, capillary     Status: Abnormal   Collection Time: 05/06/16  1:02 PM  Result Value Ref Range   Glucose-Capillary 206 (H) 65 - 99 mg/dL   Comment 1 Document in Chart   Vancomycin, trough     Status: None   Collection Time: 05/06/16  3:59 PM  Result Value Ref Range   Vancomycin Tr 18 15 - 20 ug/mL  BUN     Status: None   Collection Time: 05/06/16  3:59 PM  Result Value Ref Range   BUN 20 6 - 20 mg/dL  Creatinine, serum     Status: None   Collection Time: 05/06/16  3:59 PM  Result Value Ref Range   Creatinine, Ser 1.01 0.61 - 1.24 mg/dL   GFR calc non Af Amer >60 >60 mL/min   GFR calc Af Amer >60 >60 mL/min    Comment: (NOTE) The eGFR has been calculated using the CKD EPI equation. This calculation has not been validated in all clinical situations. eGFR's persistently <60 mL/min signify possible Chronic Kidney Disease.   Glucose, capillary     Status: Abnormal   Collection Time: 05/06/16  4:32 PM  Result Value Ref Range   Glucose-Capillary 186 (H) 65 - 99 mg/dL   Comment 1 Notify RN    Comment 2 Document in Chart   Glucose, capillary     Status: Abnormal   Collection Time: 05/06/16  9:15 PM  Result Value Ref Range   Glucose-Capillary 172 (H) 65 - 99 mg/dL   Comment 1 Notify RN   CBC     Status: Abnormal   Collection Time: 05/07/16  4:23 AM  Result Value Ref Range   WBC 16.5 (H) 3.8 - 10.6 K/uL   RBC 2.50 (L) 4.40 - 5.90 MIL/uL   Hemoglobin 7.9 (L) 13.0 - 18.0 g/dL   HCT 22.8 (L) 40.0 - 52.0 %   MCV 91.2 80.0 - 100.0 fL   MCH 31.7 26.0 - 34.0 pg   MCHC 34.7 32.0 - 36.0 g/dL   RDW 14.6 (H) 11.5 - 14.5 %   Platelets 449 (H) 150 - 440 K/uL  Basic metabolic panel     Status: Abnormal   Collection Time: 05/07/16  4:23 AM  Result Value Ref Range   Sodium 132 (L) 135 - 145 mmol/L   Potassium 3.9 3.5 - 5.1 mmol/L    Chloride 104 101 - 111 mmol/L  CO2 22 22 - 32 mmol/L   Glucose, Bld 154 (H) 65 - 99 mg/dL   BUN 20 6 - 20 mg/dL   Creatinine, Ser 1.31 (H) 0.61 - 1.24 mg/dL   Calcium 8.0 (L) 8.9 - 10.3 mg/dL   GFR calc non Af Amer 56 (L) >60 mL/min   GFR calc Af Amer >60 >60 mL/min    Comment: (NOTE) The eGFR has been calculated using the CKD EPI equation. This calculation has not been validated in all clinical situations. eGFR's persistently <60 mL/min signify possible Chronic Kidney Disease.    Anion gap 6 5 - 15  Glucose, capillary     Status: Abnormal   Collection Time: 05/07/16  7:47 AM  Result Value Ref Range   Glucose-Capillary 162 (H) 65 - 99 mg/dL   Comment 1 Notify RN    Comment 2 Document in Chart   Glucose, capillary     Status: Abnormal   Collection Time: 05/07/16  9:36 AM  Result Value Ref Range   Glucose-Capillary 196 (H) 65 - 99 mg/dL  Glucose, capillary     Status: Abnormal   Collection Time: 05/07/16  4:00 PM  Result Value Ref Range   Glucose-Capillary 201 (H) 65 - 99 mg/dL   Comment 1 Notify RN    Comment 2 Document in Chart     No results found.  Review of Systems  Constitutional: Negative for chills and fever.  HENT: Negative.   Eyes: Negative.   Respiratory: Negative.   Cardiovascular: Negative.   Gastrointestinal: Negative for nausea and vomiting.  Genitourinary: Negative.   Musculoskeletal: Negative.   Skin:       Relates a chronic ulceration on the side of his right foot.  Neurological:       Does relate some numbness and neuropathy in the feet related to his diabetes  Endo/Heme/Allergies: Negative.   Psychiatric/Behavioral: Negative.    Blood pressure (!) 156/64, pulse 97, temperature (!) 103 F (39.4 C), temperature source Oral, resp. rate 19, height _0  (1.778 m), weight 72.6 kg (160 lb), SpO2 98 %. Physical Exam  Cardiovascular:  DP pulse is diminished, trace bilateral. PT pulse is 1 over 4 on the right and nonpalpable on the left.   Musculoskeletal:  Stiff and limited range of motion in the pedal joints. Muscle testing is deferred.  Neurological:  Loss of protective threshold with monofilament wire bilateral. Proprioception is impaired.  Skin:  The skin is thin dry and atrophic with bilateral hyperpigmentation's and absent hair growth. Some edema noted in the right foot and lower leg as compared to the left. Large eschar along the lateral aspect of the right midfoot. A dorsal superficial blister formation is present over the midfoot. On debridement of the lateral aspect of the foot there is an abscess with some underlying necrotic tissue and purulence which was expressed. Evidence of exposed peroneal tendon is noted with probing to the bone at the fifth metatarsal base area.    Assessment/Plan: Assessment: 1. Full-thickness ulceration right foot with gangrenous changes. 2. Peripheral vascular disease. 3. Diabetes with associated neuropathy.  Plan: Excisional debridement of the ulceration on the lateral right foot was performed with sharp excision using a 15 blade and tissue nippers. Superficial and deep subcutaneous tissue with some of the necrosis was debrided out down to the level of tendon and bone. A wet-to-dry dressing was then applied to the wound followed by a bulky Kerlix bandage. Discussed with the patient that he will need some type  of debridement. We will order an MRI for evaluation of the extent of the infection and abscess. Pending the results of the MRI we will plan for debridement in the next day or 2  Durward Fortes 05/07/2016, 9:22 PM

## 2016-05-07 NOTE — Progress Notes (Signed)
Spoke with patient regarding elevated A1C.  He confirmed that he was taking Lantus 30 units daily prior to admit and that blood sugars were mostly high.  Reviewed importance of glycemic control for healing of wounds/infections. Discussed A1C results.  Also discussed goal blood sugars and A1C results.  He has never attended diabetes education.  Will request order per protocol/cosign required.      Note that blood sugar was low on admit.  Therefore Lantus decreased to 7 units q HS.  Consider increasing Lantus back to 15 units daily.  May also benefit from home health RN to assist with diabetes management.   Thanks, Beryl MeagerJenny Estrella Alcaraz, RN, BC-ADM Inpatient Diabetes Coordinator Pager [phone] (8a-5p)

## 2016-05-07 NOTE — Op Note (Addendum)
[facility] Percutaneous Study/Intervention Procedural Note   Date of Surgery:  05/07/2016  Surgeon(s):Shenelle Klas   Assistants:none  Pre-operative Diagnosis: PAD with ulceration right lower extremity  Post-operative diagnosis: Same  Procedure(s) Performed: 1. Ultrasound guidance for vascular access left femoral artery 2. Catheter placement into right posterior tibial artery from left femoral approach 3. Aortogram and selective right lower extremity angiogram 4. Percutaneous transluminal angioplasty of right tibioperoneal trunk and proximal posterior tibial artery with 3 mm diameter angioplasty balloon 5. Percutaneous transluminal angioplasty of the entire right popliteal artery and superficial femoral artery with 4 and 5 mm diameter angioplasty balloons including a 4 mm diameter by 15 cm length Lutonix drug-coated angioplasty balloon distally and a 5 mm diameter by 15 cm length Lutonix drug-coated angioplasty balloon proximally  6.  Viabahn covered stent placement 2 to the right SFA and popliteal arteries for greater than 50% residual stenosis and dissection in multiple areas using a 5 mm diameter by 25 cm length stent and a 5 mm diameter by 15 cm length stent 7. StarClose closure device left femoral artery  EBL: Minimal  Contrast: 80 cc  Fluoro Time: 8.6 minutes  Moderate Conscious Sedation Time: approximately 60 minutes using 6 mg of Versed and 150 mcg of Fentanyl  Indications: Patient is a 66 y.o.male with peripheral arterial disease and previous interventions with worsening ulceration and poorly palpable pedal pulses in the right foot. The patient is brought in for angiography for further evaluation and potential treatment. Risks and benefits are discussed and informed consent is obtained  Procedure: The patient was identified and appropriate procedural time out  was performed. The patient was then placed supine on the table and prepped and draped in the usual sterile fashion.Moderate conscious sedation was administered during a face to face encounter with the patient throughout the procedure with my supervision of the RN administering medicines and monitoring the patient's vital signs, pulse oximetry, telemetry and mental status throughout from the start of the procedure until the patient was taken to the recovery room. Ultrasound was used to evaluate the left common femoral artery. It was patent . A digital ultrasound image was acquired. A Seldinger needle was used to access the left common femoral artery under direct ultrasound guidance and a permanent image was performed. A 0.035 J wire was advanced without resistance and a 5Fr sheath was placed. Pigtail catheter was placed into the aorta and an AP aortogram was performed. This demonstrated normal renal arteries and normal aorta and iliac segments without significant stenosis. The 2 previous iliac artery stents appear to be widely patent. I then crossed the aortic bifurcation and advanced to the right femoral head. Selective right lower extremity angiogram was then performed. This demonstrated a low bifurcation of the femoral artery with what appeared to be 40-50% stenosis in the right common femoral artery in the normal profunda femoris artery. The superficial femoral artery and popliteal artery were diffusely diseased with multiple areas of greater than 80-90% stenosis in a reasonably small vessel. There was then 80 and 90% stenosis in the tibioperoneal trunk tracking down into a posterior tibial artery and peroneal artery is the runoff distally. They had disease at their origins, but did not appear to have focal stenosis beyond the proximal portions of these vessels.. The patient was systemically heparinized and a 6 [place] Ansell sheath was then placed over the [employer] wire. I then used a Kumpe  catheter and the advantage wire to navigate through the multiple areas of  high-grade stenosis in the SFA and popliteal artery and confirm intraluminal flow in the below-knee popliteal artery. I then exchanged for a 0.018 wire and crossed the tibioperoneal trunk and proximal posterior tibial artery stenosis and parked the wire in the foot. I then used a 3 mm diameter by 22 cm length angioplasty balloon to treat the proximal posterior tibial artery and tibioperoneal trunk. This was inflated to 10 atm for 1 minute. I then used a 4 mm diameter by 15 cm length Lutonix drug-coated angioplasty balloon to treat the high-grade popliteal artery stenosis. This was taken from the below-knee popliteal artery to the above-knee popliteal artery and inflated to 8 atm for 1 minute. The balloon was then pulled back to the proximal popliteal artery in the mid and distal superficial femoral artery and inflated to 12 atm for 1 minute. I then used a 5 mm diameter by 15 cm length Lutonix drug-coated angioplasty balloon for the proximal SFA. This was inflated to 8 atm for 1 minute. Completion angiogram showed the tibioperoneal trunk and proximal posterior tibial artery to have no greater than 30% residual stenosis, but the popliteal artery just above the knee still had a high-grade residual stenosis and dissection in the SFA and proximal popliteal artery had multiple areas of dissection with greater than 50% residual stenosis. I then elected to place stents for this residual disease. A 5 mm diameter by 25 cm length stent and a 5 mm diameter by 15 cm length stent were used from the proximal SFA to the popliteal artery just below the knee. These were postdilated with a 4 mm balloon distally and a 5 mm balloon in the mid and proximal segments. Completion angiogram showed markedly improved flow with less than 10% residual stenosis after stent placement. I elected to terminate the procedure. The sheath was removed and StarClose closure device  was deployed in the left femoral artery with excellent hemostatic result. The patient was taken to the recovery room in stable condition having tolerated the procedure well.  Findings:  Aortogram: normal renal arteries, normal aorta, iliac stents are patent bilaterally and minimal iliac stenosis is present Right Lower Extremity: Low bifurcation of the femoral artery with what appeared to be 40-50% stenosis in the right common femoral artery in the normal profunda femoris artery. The superficial femoral artery and popliteal artery were diffusely diseased with multiple areas of greater than 80-90% stenosis in a reasonably small vessel. There was then 80 and 90% stenosis in the tibioperoneal trunk tracking down into a posterior tibial artery and peroneal artery is the runoff distally. They had disease at their origins, but did not appear to have focal stenosis beyond the proximal portions of these vessels.   Disposition: Patient was taken to the recovery room in stable condition having tolerated the procedure well.  Complications: None  Leotis Pain 05/07/2016 10:52 AM   This note was created with Dragon Medical transcription system. Any errors in dictation are purely unintentional.

## 2016-05-07 NOTE — Progress Notes (Signed)
Patient has temp of 100.3. MD notified.   Harvie HeckMelanie Nomie Buchberger, RN

## 2016-05-07 NOTE — Progress Notes (Signed)
Patient is lethargic, easily arousable to voice. Reporting no pain in recovery area. Left groin site dressing in place with scant old drainage. Pedal pulses doppler x 4. Patient voided in urinal in recovery area. Groin management instructions and stent cards given to wife. [facility]. Will return to assigned room.

## 2016-05-07 NOTE — H&P (Signed)
[facility] History & Physical Update  The patient was interviewed and re-examined.  The patient's previous History and Physical has been reviewed and is unchanged.  There is no change in the plan of care. We plan to proceed with the scheduled procedure.  Festus BarrenJason Brenetta Penny, MD  05/07/2016, 8:09 AM

## 2016-05-08 ENCOUNTER — Ambulatory Visit: Payer: Commercial Managed Care - PPO | Admitting: Cardiovascular Disease

## 2016-05-08 ENCOUNTER — Inpatient Hospital Stay: Payer: Commercial Managed Care - PPO | Admitting: Anesthesiology

## 2016-05-08 ENCOUNTER — Inpatient Hospital Stay: Payer: Commercial Managed Care - PPO

## 2016-05-08 ENCOUNTER — Encounter: Payer: Self-pay | Admitting: Vascular Surgery

## 2016-05-08 ENCOUNTER — Encounter: Payer: Self-pay | Admitting: *Deleted

## 2016-05-08 ENCOUNTER — Encounter
Admission: EM | Disposition: A | Discharge status: Skilled Nursing Facility | Payer: Self-pay | Source: Home / Self Care | Attending: Internal Medicine

## 2016-05-08 HISTORY — PX: IRRIGATION AND DEBRIDEMENT FOOT: SHX6602

## 2016-05-08 LAB — CULTURE, BLOOD (ROUTINE X 2)
CULTURE: NO GROWTH
Culture: NO GROWTH

## 2016-05-08 LAB — CBC
HEMATOCRIT: 22.6 % — AB (ref 40.0–52.0)
HEMOGLOBIN: 7.6 g/dL — AB (ref 13.0–18.0)
MCH: 31.1 pg (ref 26.0–34.0)
MCHC: 33.5 g/dL (ref 32.0–36.0)
MCV: 92.6 fL (ref 80.0–100.0)
Platelets: 460 10*3/uL — ABNORMAL HIGH (ref 150–440)
RBC: 2.45 MIL/uL — AB (ref 4.40–5.90)
RDW: 14.5 % (ref 11.5–14.5)
WBC: 18.3 10*3/uL — ABNORMAL HIGH (ref 3.8–10.6)

## 2016-05-08 LAB — GLUCOSE, CAPILLARY
GLUCOSE-CAPILLARY: 148 mg/dL — AB (ref 65–99)
GLUCOSE-CAPILLARY: 149 mg/dL — AB (ref 65–99)
GLUCOSE-CAPILLARY: 160 mg/dL — AB (ref 65–99)
GLUCOSE-CAPILLARY: 151 mg/dL — AB (ref 65–99)
Glucose-Capillary: 143 mg/dL — ABNORMAL HIGH (ref 65–99)
Glucose-Capillary: 159 mg/dL — ABNORMAL HIGH (ref 65–99)

## 2016-05-08 SURGERY — IRRIGATION AND DEBRIDEMENT FOOT
Anesthesia: General | Laterality: Right | Wound class: Clean Contaminated

## 2016-05-08 MED ORDER — PROPOFOL 10 MG/ML IV BOLUS
INTRAVENOUS | Status: DC | PRN
  Administered 2016-05-08: 100 mg via INTRAVENOUS

## 2016-05-08 MED ORDER — LACTATED RINGERS IV SOLN
INTRAVENOUS | Status: DC | PRN
  Administered 2016-05-08: 13:00:00 via INTRAVENOUS

## 2016-05-08 MED ORDER — DEXTROSE 5 % IV SOLN: 1000.0000 mg | Freq: Once | INTRAVENOUS | Status: DC

## 2016-05-08 MED ORDER — PROPOFOL 10 MG/ML IV BOLUS
INTRAVENOUS | Status: AC
  Filled 2016-05-08: qty 20

## 2016-05-08 MED ORDER — PHENYLEPHRINE HCL 10 MG/ML IJ SOLN
INTRAMUSCULAR | Status: DC | PRN
  Administered 2016-05-08 (×7): 100 ug via INTRAVENOUS
  Administered 2016-05-08: 200 ug via INTRAVENOUS

## 2016-05-08 MED ORDER — THROMBIN 5000 UNITS EX SOLR
CUTANEOUS | Status: AC
  Filled 2016-05-08: qty 5000

## 2016-05-08 MED ORDER — FENTANYL CITRATE (PF) 100 MCG/2ML IJ SOLN
INTRAMUSCULAR | Status: DC | PRN
  Administered 2016-05-08: 25 ug via INTRAVENOUS
  Administered 2016-05-08: 50 ug via INTRAVENOUS
  Administered 2016-05-08: 25 ug via INTRAVENOUS

## 2016-05-08 MED ORDER — CEFAZOLIN IN D5W 1 GM/50ML IV SOLN
1.0000 g | Freq: Once | INTRAVENOUS | Status: AC
  Administered 2016-05-08: 1 g via INTRAVENOUS

## 2016-05-08 MED ORDER — LIDOCAINE HCL (PF) 2 % IJ SOLN
INTRAMUSCULAR | Status: AC
Start: 2016-05-08 — End: 2016-05-08
  Filled 2016-05-08: qty 2

## 2016-05-08 MED ORDER — BUPIVACAINE HCL (PF) 0.5 % IJ SOLN
INTRAMUSCULAR | Status: DC | PRN
  Administered 2016-05-08: 10 mL

## 2016-05-08 MED ORDER — BUPIVACAINE HCL (PF) 0.5 % IJ SOLN
INTRAMUSCULAR | Status: AC
  Filled 2016-05-08: qty 30

## 2016-05-08 MED ORDER — VANCOMYCIN HCL 1000 MG IV SOLR
INTRAVENOUS | Status: DC | PRN
  Administered 2016-05-08: 1000 mg

## 2016-05-08 MED ORDER — OXYCODONE HCL 5 MG PO TABS: 5.0000 mg | ORAL_TABLET | Freq: Once | ORAL | Status: DC | PRN

## 2016-05-08 MED ORDER — LIDOCAINE HCL (PF) 1 % IJ SOLN
INTRAMUSCULAR | Status: AC
  Filled 2016-05-08: qty 30

## 2016-05-08 MED ORDER — THROMBIN 5000 UNITS EX SOLR
CUTANEOUS | Status: DC | PRN
  Administered 2016-05-08: 5000 [IU] via TOPICAL

## 2016-05-08 MED ORDER — EPHEDRINE SULFATE 50 MG/ML IJ SOLN
INTRAMUSCULAR | Status: DC | PRN
  Administered 2016-05-08: 15 mg via INTRAVENOUS

## 2016-05-08 MED ORDER — SUCCINYLCHOLINE CHLORIDE 20 MG/ML IJ SOLN
INTRAMUSCULAR | Status: DC | PRN
Start: 2016-05-08 — End: 2016-05-08
  Administered 2016-05-08: 100 mg via INTRAVENOUS

## 2016-05-08 MED ORDER — FENTANYL CITRATE (PF) 100 MCG/2ML IJ SOLN
INTRAMUSCULAR | Status: AC
  Filled 2016-05-08: qty 2

## 2016-05-08 MED ORDER — VANCOMYCIN HCL 1000 MG IV SOLR
INTRAVENOUS | Status: AC
  Filled 2016-05-08: qty 1000

## 2016-05-08 MED ORDER — SODIUM CHLORIDE 0.9 % IV SOLN
INTRAVENOUS | Status: DC
  Administered 2016-05-08 – 2016-05-11 (×2): via INTRAVENOUS

## 2016-05-08 MED ORDER — OXYCODONE HCL 5 MG/5ML PO SOLN: 5.0000 mg | Freq: Once | ORAL | Status: DC | PRN

## 2016-05-08 MED ORDER — MIDAZOLAM HCL 2 MG/2ML IJ SOLN
INTRAMUSCULAR | Status: DC | PRN
  Administered 2016-05-08 (×2): 1 mg via INTRAVENOUS

## 2016-05-08 MED ORDER — FENTANYL CITRATE (PF) 100 MCG/2ML IJ SOLN: 25.0000 ug | INTRAMUSCULAR | Status: DC | PRN

## 2016-05-08 MED ORDER — PIPERACILLIN-TAZOBACTAM 3.375 G IVPB
3.3750 g | Freq: Three times a day (TID) | INTRAVENOUS | Status: DC
  Administered 2016-05-08 – 2016-05-11 (×8): 3.375 g via INTRAVENOUS
  Filled 2016-05-08 (×8): qty 50

## 2016-05-08 MED ORDER — MIDAZOLAM HCL 2 MG/2ML IJ SOLN
INTRAMUSCULAR | Status: AC
  Filled 2016-05-08: qty 2

## 2016-05-08 SURGICAL SUPPLY — 55 items
AGENT HMST MTR 8 SURGIFLO (HEMOSTASIS) ×1
BANDAGE ELASTIC 4 LF NS (GAUZE/BANDAGES/DRESSINGS) ×3 IMPLANT
BLADE OSCILLATING/SAGITTAL (BLADE) ×3
BLADE SURG 15 STRL LF DISP TIS (BLADE) ×1 IMPLANT
BLADE SURG 15 STRL SS (BLADE) ×3
BLADE SW THK.38XMED LNG THN (BLADE) ×1 IMPLANT
BNDG CMPR 75X21 PLY HI ABS (MISCELLANEOUS) ×1
BNDG CMPR MED 5X4 ELC HKLP NS (GAUZE/BANDAGES/DRESSINGS) ×1
BNDG ESMARK 4X12 TAN STRL LF (GAUZE/BANDAGES/DRESSINGS) ×3 IMPLANT
BNDG GAUZE 4.5X4.1 6PLY STRL (MISCELLANEOUS) ×3 IMPLANT
CANISTER SUCT 1200ML W/VALVE (MISCELLANEOUS) ×3 IMPLANT
CUFF TOURN 18 STER (MISCELLANEOUS) ×1 IMPLANT
CUFF TOURN DUAL PL 12 NO SLV (MISCELLANEOUS) ×3 IMPLANT
DRAPE FLUOR MINI C-ARM 54X84 (DRAPES) IMPLANT
DRSG MEPITEL 4X7.2 (GAUZE/BANDAGES/DRESSINGS) ×2 IMPLANT
DURAPREP 26ML APPLICATOR (WOUND CARE) ×3 IMPLANT
ELECT REM PT RETURN 9FT ADLT (ELECTROSURGICAL) ×3
ELECTRODE REM PT RTRN 9FT ADLT (ELECTROSURGICAL) ×1 IMPLANT
GAUZE PETRO XEROFOAM 1X8 (MISCELLANEOUS) ×3 IMPLANT
GAUZE SPONGE 4X4 12PLY STRL (GAUZE/BANDAGES/DRESSINGS) ×3 IMPLANT
GAUZE STRETCH 2X75IN STRL (MISCELLANEOUS) ×3 IMPLANT
GLOVE BIO SURGEON STRL SZ7.5 (GLOVE) ×3 IMPLANT
GLOVE INDICATOR 8.0 STRL GRN (GLOVE) ×3 IMPLANT
GOWN STRL REUS W/ TWL LRG LVL3 (GOWN DISPOSABLE) ×2 IMPLANT
GOWN STRL REUS W/TWL LRG LVL3 (GOWN DISPOSABLE) ×6
HANDPIECE VERSAJET DEBRIDEMENT (MISCELLANEOUS) ×3 IMPLANT
KIT STIMULAN RAPID CURE 5CC (Orthopedic Implant) ×2 IMPLANT
LABEL OR SOLS (LABEL) ×3 IMPLANT
NDL FILTER BLUNT 18X1 1/2 (NEEDLE) ×1 IMPLANT
NDL HYPO 25X1 1.5 SAFETY (NEEDLE) ×3 IMPLANT
NEEDLE FILTER BLUNT 18X 1/2SAF (NEEDLE) ×2
NEEDLE FILTER BLUNT 18X1 1/2 (NEEDLE) ×1 IMPLANT
NEEDLE HYPO 25X1 1.5 SAFETY (NEEDLE) ×9 IMPLANT
NS IRRIG 500ML POUR BTL (IV SOLUTION) ×3 IMPLANT
PACK EXTREMITY ARMC (MISCELLANEOUS) ×3 IMPLANT
PAD ABD DERMACEA PRESS 5X9 (GAUZE/BANDAGES/DRESSINGS) ×6 IMPLANT
PENCIL ELECTRO HAND CTR (MISCELLANEOUS) ×3 IMPLANT
RASP SM TEAR CROSS CUT (RASP) ×3 IMPLANT
SOL PREP PVP 2OZ (MISCELLANEOUS) ×3
SOLUTION PREP PVP 2OZ (MISCELLANEOUS) ×1 IMPLANT
SPOGE SURGIFLO 8M (HEMOSTASIS) ×2
SPONGE LAP 18X18 5 PK (GAUZE/BANDAGES/DRESSINGS) ×2 IMPLANT
SPONGE SURGIFLO 8M (HEMOSTASIS) IMPLANT
STAPLER SKIN PROX 35W (STAPLE) ×2 IMPLANT
STOCKINETTE 48X4 2 PLY STRL (GAUZE/BANDAGES/DRESSINGS) ×1 IMPLANT
STOCKINETTE STRL 4IN 9604848 (GAUZE/BANDAGES/DRESSINGS) IMPLANT
STOCKINETTE STRL 6IN 960660 (GAUZE/BANDAGES/DRESSINGS) ×3 IMPLANT
SUT ETHILON 4-0 (SUTURE) ×3
SUT ETHILON 4-0 FS2 18XMFL BLK (SUTURE) ×1
SUT VIC AB 3-0 SH 27 (SUTURE) ×3
SUT VIC AB 3-0 SH 27X BRD (SUTURE) ×1 IMPLANT
SUT VIC AB 4-0 FS2 27 (SUTURE) ×3 IMPLANT
SUTURE ETHLN 4-0 FS2 18XMF BLK (SUTURE) ×1 IMPLANT
SYR 3ML LL SCALE MARK (SYRINGE) ×3 IMPLANT
SYRINGE 10CC LL (SYRINGE) ×6 IMPLANT

## 2016-05-08 NOTE — OR Nursing (Signed)
Wedding band sent to OR with A Helton RN.  Dressing to right foot intact no drainage noted.  Dressing to left groin stained with old blood.  Dr Alberteen Spindleline aware SCD not placed on patient do to surgery yesterday on left groin  and surgery again today on right foot, no new orders at this time.

## 2016-05-08 NOTE — Transfer of Care (Signed)
Immediate Anesthesia Transfer of Care Note  Patient: Damon [name]  Procedure(s) Performed: Procedure(s): IRRIGATION AND DEBRIDEMENT FOOT WITH PLACEMENT OF ANTIBIOTIC BEADS (Right)  Patient Location: PACU  Anesthesia Type:General  Level of Consciousness: sedated  Airway & Oxygen Therapy: Patient Spontanous Breathing  Post-op Assessment: Report given to RN and Post -op Vital signs reviewed and stable  Post vital signs: Reviewed and stable  Last Vitals:  Vitals:   05/08/16 1229 05/08/16 1232  BP: (!) 167/69   Pulse: 84   Resp: 16   Temp:  37.9 C    Last Pain:  Vitals:   05/08/16 1232  TempSrc: Temporal  PainSc:          Complications: No apparent anesthesia complications

## 2016-05-08 NOTE — Progress Notes (Signed)
Pharmacy Antibiotic Note  Damon [name] is a 66 y.o. male admitted on 05/03/2016 with wound infection.  Pharmacy has been consulted for piperacillin/tazobactam dosing. Per ID, planned 4-6 week duration of antibiotic therapy.  Plan: Piperacillin/tazobactam 3.375 g IV q8h EI  Height: 5\' 10"  (177.8 cm) Weight: 160 lb (72.6 kg) IBW/kg (Calculated) : 73  Temp (24hrs), Avg:99.9 F (37.7 C), Min:98.7 F (37.1 C), Max:103 F (39.4 C)   Recent Labs Lab 05/03/16 1631 05/05/16 0353 05/06/16 0325 05/06/16 1559 05/07/16 0423 05/08/16 0638  WBC 17.0*  --  16.2*  --  16.5* 18.3*  CREATININE 1.24 1.39*  --  1.01 1.31*  --   LATICACIDVEN 1.7  --   --   --   --   --   VANCOTROUGH  --   --  24* 18  --   --     Estimated Creatinine Clearance: 58.5 mL/min (by C-G formula based on SCr of 1.31 mg/dL (H)).    No Known Allergies   Thank you for allowing pharmacy to be a part of this patient's care.  Cindi CarbonMary M Annalina Needles, PharmD Clinical Pharmacist 05/08/2016 4:33 PM

## 2016-05-08 NOTE — Op Note (Signed)
Date of operation: 05/08/2016.  Surgeon: Ricci Barkerodd W Christopher Glasscock DPM.  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  Postoperative diagnosis: Same.  Procedure: Excisional debridement infected soft tissue and bone right lateral foot.  Anesthesia: Gen. endotracheal.  Hemostasis: None.  Estimated blood loss: Approximately 50 cc.  Pathology: Right fifth metatarsal base.  Cultures: Bone cultures right fifth metatarsal.  Injectables: 10 cc 0.5% bupivacaine plain post procedure.  Implants: Stimulan rapid cure antibiotic beads impregnated with vancomycin.                  Surgi-Flo for bleeding control.  Complications: None apparent.  Operative indications: This is a 66 year old male with a history of a chronic ulceration on the lateral aspect of his right foot. Ulcer continued to progress and was referred for vascular intervention. Patient was hospitalized prior to his intervention due to infection. Revascularization was performed yesterday and patient stabilized for debridement of the infected soft tissue and bone.  Operative procedure: Patient was taken to the operating room and placed on the table in the supine position. Following satisfactory general endotracheal anesthesia the right foot was prepped and draped in usual sterile fashion. Attention was then directed to the lateral aspect of the left foot where a large open ulceration was present. An approximate 2 cm linear incision was made proximal from the ulceration and carried sharply down to the deeper subcutaneous tissues. A moderate amount of necrotic and purulent tissue was noted tracking along the peroneal tendon sheath area. An approximate 1 cm linear incision was made distally from the ulceration along the fifth metatarsal shaft. This was carried sharply down to the level of bone. Necrotic exposed bone was noted at the base of the fifth metatarsal and the metatarsal was transected using a sagittal saw along its  proximal one third and the proximal portion was removed in toto. At this point there was noted to be an abscess tracking along the lateral aspect of the fifth metatarsal shaft into the fourth interspace need the fourth metatarsal area. Also was some abscess tracking along the dorsal aspect of the foot underneath the superficial tissues. A section of devitalized and damaged peroneal tendon was then resected back to normal healthy tendon at the proximal aspect of the incision gross infected and necrotic tissue was then excised excisionally using a 15 blade down through the superficial and deep subcutaneous tissue all the way down to the level of bone cortex. The wound was then thoroughly debrided using a versa jet debrider on a setting of 6-7 or all of the devitalized wound edges followed by lashing of the wound with the versa jet on a setting of 2. There was noted to be good healthy bleeding tissues throughout the wound. Some bleeders were noted along the fourth metatarsal area which could not be clearly identified and coagulated. Some Stimulan rapid cure antibiotic beads impregnated with vancomycin were then placed into the tunnel beneath the fourth metatarsal area and the proximal aspect of the incision area. Surgi-Flo was then injected to help with hemostasis. The proximal and distal incisions were then closed with 3-0 nylon simple interrupted sutures. A Mepitel dressing was then stapled around the inferior aspect of the wound and the remainder of the antibiotic beads were then placed into the wound with some of underneath the dorsal aspect of the foot as well. The remainder of the wound was then covered with the Mepitel and stapled intact. 4 x 4's fluffs ABDs Kerlix and an Ace wrap were then  applied to the left lower extremity. Patient was awakened and extubated and transported to the PACU with vital signs stable and in good condition.

## 2016-05-08 NOTE — Progress Notes (Signed)
RN called vascular access to place PICC. Consent signed and on chart. Vascular RN will be here to place PICC around 8:00pm.   Harvie HeckMelanie Nisreen Guise, RN

## 2016-05-08 NOTE — Progress Notes (Deleted)
Cardiology Office Note   Date:  05/08/2016   ID:  Damon [name], DOB 11/28/1950, MRN [id number]  PCP:  Emogene Morgan, MD  Cardiologist:   Lorine Bears, MD   No chief complaint on file.     History of Present Illness: Damon [name] is a 66 y.o. male who presents for A follow-up visit regarding coronary artery disease and chronic systolic heart failure due to ischemic cardiomyopathy. He presented in October with subacute anterior wall MI in the setting of a new LBBB and progressive dyspnea requiring intubation. Emergent cardiac catheterization showed occluded LAD which was stented. He underwent staged PCI of a large OM branch. The right coronary artery was medium in size and codominant. There was significant disease in the midsegment that was left to be treated medically. Ejection fraction was 25-30%. He was discharged home with a LifeVest. Also with history of PAD s/p stenting, CKD stage III, DM, HTN, and tobacco abuse.   He had 2 hospitalizations since his initial cardiac event. The first was due to shortness of breath in the setting of excessive heat. The second hospitalization was recent and due to acute on chronic renal failure and hyponatremia. He improved with hydration. He complains of poor appetite and lost 12 pounds since October. Unfortunately, he continues to smoke half a pack per day. He developed a blister on his right foot and has been having difficulty walking due to that. He denies any chest pain. He reports stable exertional dyspnea.  Past Medical History:  Diagnosis Date  . CKD (chronic kidney disease), stage III   . COPD (chronic obstructive pulmonary disease) (HCC)   . Diabetes mellitus without complication (HCC)   . Diverticulitis   . Essential hypertension   . MI (myocardial infarction)   . PAD (peripheral artery disease) (HCC)    a. 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  . Tobacco abuse     Past Surgical History:    Procedure Laterality Date  . CARDIAC CATHETERIZATION N/A 01/13/2016   Procedure: LEFT HEART CATH AND CORONARY ANGIOGRAPHY;  Surgeon: Iran Ouch, MD;  Location: [facility] INVASIVE CV LAB;  Service: Cardiovascular;  Laterality: N/A;  . CARDIAC CATHETERIZATION N/A 01/13/2016   Procedure: Coronary Stent Intervention;  Surgeon: Iran Ouch, MD;  Location: [facility] INVASIVE CV LAB;  Service: Cardiovascular;  Laterality: N/A;  . CARDIAC CATHETERIZATION N/A 01/16/2016   Procedure: Left Heart Cath and Coronary Angiography;  Surgeon: Iran Ouch, MD;  Location: [facility] INVASIVE CV LAB;  Service: Cardiovascular;  Laterality: N/A;  . NECK SURGERY    . PERIPHERAL VASCULAR CATHETERIZATION Left 11/01/2015   Procedure: Lower Extremity Angiography;  Surgeon: Renford Dills, MD;  Location: [facility] INVASIVE CV LAB;  Service: Cardiovascular;  Laterality: Left;  . PERIPHERAL VASCULAR CATHETERIZATION Right 05/07/2016   Procedure: Lower Extremity Angiography;  Surgeon: Annice Needy, MD;  Location: [facility] INVASIVE CV LAB;  Service: Cardiovascular;  Laterality: Right;     No current facility-administered medications for this visit.    No current outpatient prescriptions on file.   Facility-Administered Medications Ordered in Other Visits  Medication Dose Route Frequency Provider Last Rate Last Dose  . [MAR Hold] 0.9 %  sodium chloride infusion  500 mL Intravenous Once PRN Annice Needy, MD      . 0.9 %  sodium chloride infusion   Intravenous Continuous Rosaria Ferries, MD 50 mL/hr at 05/08/16 1300    . [  MAR Hold] acetaminophen (TYLENOL) tablet 325-650 mg  325-650 mg Oral Q4H PRN Annice Needy, MD       Or  . Mitzi Hansen Hold] acetaminophen (TYLENOL) suppository 325-650 mg  325-650 mg Rectal Q4H PRN Annice Needy, MD      . Mitzi Hansen Hold] acetaminophen (TYLENOL) tablet 650 mg  650 mg Oral Q6H PRN Enedina Finner, MD   650 mg at 05/07/16 2004   Or  . [MAR Hold] acetaminophen (TYLENOL) suppository 650 mg  650 mg Rectal Q6H PRN Enedina Finner, MD      . Mitzi Hansen Hold] albuterol (PROVENTIL) (2.5 MG/3ML) 0.083% nebulizer solution 2.5 mg  2.5 mg Nebulization Q6H PRN Enedina Finner, MD      . Mitzi Hansen Hold] amoxicillin-clavulanate (AUGMENTIN) 875-125 MG per tablet 1 tablet  1 tablet Oral Q12H Shaune Pollack, MD   Stopped at 05/08/16 1000  . [MAR Hold] ARIPiprazole (ABILIFY) tablet 15 mg  15 mg Oral Daily Enedina Finner, MD   Stopped at 05/08/16 1000  . [MAR Hold] aspirin EC tablet 81 mg  81 mg Oral Daily Enedina Finner, MD   Stopped at 05/08/16 1000  . [MAR Hold] atorvastatin (LIPITOR) tablet 80 mg  80 mg Oral q1800 Enedina Finner, MD   80 mg at 05/07/16 1749  . [MAR Hold] bisacodyl (DULCOLAX) suppository 10 mg  10 mg Rectal Daily PRN Enedina Finner, MD      . Mitzi Hansen Hold] carvedilol (COREG) tablet 6.25 mg  6.25 mg Oral BID WC Shaune Pollack, MD   6.25 mg at 05/08/16 0904  . [MAR Hold] ciprofloxacin (CIPRO) IVPB 400 mg  400 mg Intravenous Q12H Sheema M Hallaji, RPH   400 mg at 05/08/16 0907  . [MAR Hold] enoxaparin (LOVENOX) injection 40 mg  40 mg Subcutaneous Q24H Enedina Finner, MD   40 mg at 05/07/16 2004  . fentaNYL (SUBLIMAZE) injection    Anesthesia Intra-op Almeta Monas, CRNA   25 mcg at 05/08/16 1330  . [MAR Hold] gabapentin (NEURONTIN) capsule 300 mg  300 mg Oral Daily Enedina Finner, MD   Stopped at 05/08/16 1000  . [MAR Hold] guaiFENesin-dextromethorphan (ROBITUSSIN DM) 100-10 MG/5ML syrup 15 mL  15 mL Oral Q4H PRN Annice Needy, MD      . Mitzi Hansen Hold] hydrALAZINE (APRESOLINE) injection 5 mg  5 mg Intravenous Q20 Min PRN Annice Needy, MD      . Mitzi Hansen Hold] HYDROmorphone (DILAUDID) injection 0.5-1 mg  0.5-1 mg Intravenous Q2H PRN Annice Needy, MD      . Mitzi Hansen Hold] HYDROmorphone (DILAUDID) injection 1 mg  1 mg Intravenous Once [employer], PA-C      . [MAR Hold] insulin aspart (novoLOG) injection 0-9 Units  0-9 Units Subcutaneous TID WC Enedina Finner, MD   1 Units at 05/08/16 1210  . [MAR Hold] insulin glargine (LANTUS) injection 10 Units  10 Units Subcutaneous QHS Shaune Pollack, MD   10 Units at 05/07/16 2127  . [MAR Hold] ipratropium-albuterol (DUONEB) 0.5-2.5 (3) MG/3ML nebulizer solution 3 mL  3 mL Nebulization Q4H PRN Enedina Finner, MD      . Mitzi Hansen Hold] labetalol (NORMODYNE,TRANDATE) injection 10 mg  10 mg Intravenous Q10 min PRN Annice Needy, MD      . lactated ringers infusion    Continuous PRN Almeta Monas, CRNA      . [MAR Hold] lactulose (CHRONULAC) 10 GM/15ML solution 20 g  20 g Oral BID PRN Shaune Pollack, MD      . Mitzi Hansen  Hold] metoprolol (LOPRESSOR) injection 2-5 mg  2-5 mg Intravenous Q2H PRN Annice NeedyJason S Dew, MD      . Mitzi Hansen[MAR Hold] ondansetron [facility](ZOFRAN) injection 4 mg  4 mg Intravenous Q6H PRN Annice NeedyJason S Dew, MD   4 mg at 05/08/16 1328  . [MAR Hold] ondansetron (ZOFRAN) injection 4 mg  4 mg Intravenous Q6H PRN Ranae PlumberKimberly A Stegmayer, PA-C      . [MAR Hold] oxyCODONE-acetaminophen (PERCOCET/ROXICET) 5-325 MG per tablet 1 tablet  1 tablet Oral Q6H PRN Enedina FinnerSona Patel, MD   1 tablet at 05/06/16 1513  . [MAR Hold] phenol (CHLORASEPTIC) mouth spray 1 spray  1 spray Mouth/Throat PRN Annice NeedyJason S Dew, MD      . phenylephrine (NEO-SYNEPHRINE) injection   Intravenous Anesthesia Intra-op Almeta MonasPatricia Fletcher, CRNA   100 mcg at 05/08/16 1341  . [MAR Hold] polyethylene glycol (MIRALAX / GLYCOLAX) packet 17 g  17 g Oral Daily PRN Enedina FinnerSona Patel, MD      . propofol (DIPRIVAN) 10 mg/mL bolus/IV push    Anesthesia Intra-op Almeta MonasPatricia Fletcher, CRNA   100 mg at 05/08/16 1311  . [MAR Hold] senna (SENOKOT) tablet 8.6 mg  1 tablet Oral BID Enedina FinnerSona Patel, MD   Stopped at 05/08/16 1000  . succinylcholine (ANECTINE) injection   Intravenous Anesthesia Intra-op Almeta MonasPatricia Fletcher, CRNA   100 mg at 05/08/16 1318  . [MAR Hold] ticagrelor (BRILINTA) tablet 90 mg  90 mg Oral BID Enedina FinnerSona Patel, MD   Stopped at 05/08/16 1000  . [MAR Hold] traMADol (ULTRAM) tablet 50 mg  50 mg Oral Q6H PRN Enedina FinnerSona Patel, MD   50 mg at 05/05/16 1718    Allergies:   Patient has no known allergies.    Social History:  The patient  reports that he  has been smoking Cigarettes.  He has a 7.50 pack-year smoking history. He uses smokeless tobacco. He reports that he does not drink alcohol or use drugs.   Family History:  The patient's family history includes Cancer in his mother; Diabetes in his father and mother; Intracerebral hemorrhage in his mother.    ROS:  Please see the history of present illness.   Otherwise, review of systems are positive for none.   All other systems are reviewed and negative.    PHYSICAL EXAM: VS:  There were no vitals taken for this visit. , BMI There is no height or weight on file to calculate BMI. GEN: Well nourished, well developed, in no acute distress  HEENT: normal  Neck: no JVD, carotid bruits, or masses Cardiac: RRR; no murmurs, rubs, or gallops,no edema  Respiratory:  clear to auscultation bilaterally, normal work of breathing GI: soft, nontender, nondistended, + BS MS: no deformity or atrophy  Skin: warm and dry, no rash Neuro:  Strength and sensation are intact Psych: euthymic mood, full affect   EKG:  EKG is not ordered today.    Recent Labs: 01/15/2016: Magnesium 2.0 03/27/2016: B Natriuretic Peptide 503.0 05/03/2016: ALT 47 05/07/2016: BUN 20; Creatinine, Ser 1.31; Potassium 3.9; Sodium 132 05/08/2016: Hemoglobin 7.6; Platelets 460    Lipid Panel    Component Value Date/Time   CHOL 94 02/15/2016 0552   TRIG 64 02/15/2016 0552   HDL 23 (L) 02/15/2016 0552   CHOLHDL 4.1 02/15/2016 0552   VLDL 13 02/15/2016 0552   LDLCALC 58 02/15/2016 0552      Wt Readings from Last 3 Encounters:  05/03/16 160 lb (72.6 kg)  05/02/16 165 lb (74.8 kg)  04/27/16 160 lb (72.6 kg)  No flowsheet data found.    ASSESSMENT AND PLAN:  1.  Coronary artery disease involving native coronary arteries without angina: Currently with no anginal symptoms. Continue dual antiplatelet therapy with aspirin and Brilinta.  2. Chronic systolic heart failure: He was recently discharged due to acute on  chronic renal failure with hyponatremia due to volume depletion. He has been having poor oral intake. I elected to discontinue furosemide altogether.   his blood pressure has been running low and I elected to decrease the dose of carvedilol to 12.5 mg twice daily.    3. Ulceration on the right foot with known history of peripheral arterial disease. He is going to make a follow-up appointment with his podiatrist. I also advised him to follow-up with Dr. Gilda Crease regarding the possibility of peripheral arterial disease contributing to this. He is known to have bilateral SFA occlusion and had previous iliac artery stenting.  4. Tobacco use: I had a prolonged discussion with him about the importance of smoking cessation.  The patient was supposed to go back to work this week. However, he is having multiple medical problems especially with the ulceration on his right foot. He is not able to go back to work and I extended his return to work for at least another month until reevaluation.    Disposition:   FU with me in 1 month  Signed,  Lorine Bears, MD  05/08/2016 1:46 PM    [facility]

## 2016-05-08 NOTE — Progress Notes (Signed)
Patient back from surgery. Alert and oriented to person and place. Patient has full sensation in both lower extremities and able to wiggle toes. Patient complaining of pain 7 out of 10.   Harvie HeckMelanie Lovina Zuver, RN

## 2016-05-08 NOTE — Progress Notes (Signed)
[employer] [facility] [place] [facility]   PATIENT NAME: Damon [name]    MR#:  [id number]  DATE OF BIRTH:  1950/07/28  SUBJECTIVE:  CHIEF COMPLAINT:   Chief Complaint  Patient presents with  . Shortness of Breath     Have vascular problems, s/p previous procedures. Came with ulcers and infection. chills this am.  REVIEW OF SYSTEMS:  CONSTITUTIONAL: No fever, fatigue or weakness.  EYES: No blurred or double vision.  EARS, NOSE, AND THROAT: No tinnitus or ear pain.  RESPIRATORY: No cough, shortness of breath, wheezing or hemoptysis.  CARDIOVASCULAR: No chest pain, orthopnea, edema.  GASTROINTESTINAL: No nausea, vomiting, diarrhea or abdominal pain.  GENITOURINARY: No dysuria, hematuria.  ENDOCRINE: No polyuria, nocturia,  HEMATOLOGY: No anemia, easy bruising or bleeding SKIN: ulcer on right foot, rash or lesion. MUSCULOSKELETAL: No joint pain or arthritis.   NEUROLOGIC: No tingling, numbness, weakness.  PSYCHIATRY: No anxiety or depression.   ROS  DRUG ALLERGIES:  No Known Allergies  VITALS:  Blood pressure (!) 126/53, pulse 91, temperature 99.3 F (37.4 C), temperature source Oral, resp. rate 16, height 5\' 10"  (1.778 m), weight 160 lb (72.6 kg), SpO2 99 %.  PHYSICAL EXAMINATION:  GENERAL:  66 y.o.-year-old patient lying in the bed with no acute distress.  EYES: Pupils equal, round, reactive to light and accommodation. No scleral icterus. Extraocular muscles intact.  HEENT: Head atraumatic, normocephalic. Oropharynx and nasopharynx clear.  NECK:  Supple, no jugular venous distention. No thyroid enlargement, no tenderness.  LUNGS: Normal breath sounds bilaterally, no wheezing, rales,rhonchi or crepitation. No use of accessory muscles of respiration.  CARDIOVASCULAR: S1, S2 normal. No murmurs, rubs, or gallops.  ABDOMEN: Soft, nontender, nondistended. Bowel sounds present. No organomegaly or mass.  EXTREMITIES: No pedal edema, cyanosis, or clubbing. right foot in  dressing. NEUROLOGIC: Cranial nerves II through XII are intact. Muscle strength 4/5 in all extremities. Sensation intact. Gait not checked.  PSYCHIATRIC: The patient is alert and oriented x 3.  SKIN: No obvious rash, lesion, or ulcer.    Physical Exam LABORATORY PANEL:   CBC  Recent Labs Lab 05/08/16 0638  WBC 18.3*  HGB 7.6*  HCT 22.6*  PLT 460*   ------------------------------------------------------------------------------------------------------------------  Chemistries   Recent Labs Lab 05/03/16 1631  05/07/16 0423  NA 134*  --  132*  K 4.3  --  3.9  CL 102  --  104  CO2 22  --  22  GLUCOSE 52*  --  154*  BUN 21*  < > 20  CREATININE 1.24  < > 1.31*  CALCIUM 8.6*  --  8.0*  AST 35  --   --   ALT 47  --   --   ALKPHOS 81  --   --   BILITOT 0.8  --   --   < > = values in this interval not displayed. ------------------------------------------------------------------------------------------------------------------  Cardiac Enzymes  Recent Labs Lab 05/03/16 1631  TROPONINI 0.06*   ------------------------------------------------------------------------------------------------------------------  RADIOLOGY:  Mr Foot Right Wo Contrast  Result Date: 05/08/2016 CLINICAL DATA:  Open wound involving the base of the fifth metatarsal. Diabetic. EXAM: MRI OF THE RIGHT FOREFOOT WITHOUT CONTRAST TECHNIQUE: Multiplanar, multisequence MR imaging of the right foot was performed. No intravenous contrast was administered. COMPARISON:  Radiographs 05/03/2016 FINDINGS: Diffuse subcutaneous soft tissue swelling/edema/ fluid consistent with cellulitis. There is diffuse edema like signal abnormality in the foot musculature consistent with myofasciitis. Large open wound is noted on the lateral aspect of the foot in  the region of the base of the fifth metatarsal. There is some underlying fluid. There is also fluid tracking along the plantar aspect of the fourth metatarsal which could be  an abscess. Difficult to be certain without contrast. T2 and STIR signal abnormality in the base of the fifth metatarsal and also along the lateral margin of the cuboid suspicious for osteomyelitis. No definite MR findings for septic arthritis. IMPRESSION: 1. Large open wound along the lateral aspect of the midfoot in the region of the base of the fifth metatarsal. This appears to extend right down to the bone. 2. Diffuse cellulitis and myofasciitis. 3. Fluid collection tracking from the area of the open wound along the plantar aspect of the fourth metatarsal is worrisome for an abscess. 4. Findings suspicious for osteomyelitis involving the base of the fifth metatarsal and the lateral aspect of the cuboid. Electronically Signed   By: Rudie Meyer M.D.   On: 05/08/2016 11:10    ASSESSMENT AND PLAN:   Active Problems:   Right foot ulcer (HCC)   Damon [name]  is a 66 y.o. male with a known history of Peripheral vascular disease status post stenting and PTCA in July 2017 right common iliac left common iliac and posterior tibial artery, ongoing tobacco abuse, hypertension, history of CKD stage III and COPD comes to the emergency room with fever, generalized weakness and foul-smelling ulcer on the right foot which appears dry and has darkened skin around.   1. Right foot nonhealing dry gangrene/ulcer with foul-smelling, osteomyelitis. -He was started recently on Keflex, changed to IV Zosyn negative blood cultures so far.  Wound culture showed MODERATE ENTEROBACTER CLOACAE and FEW GROUP B STREP(S.AGALACTIAE) MRI evidence of osteomyelitis and abscess. S/p Excisional debridement infected soft tissue and bone right lateral foot today (05/08/2016)  Zosyn every 8 hours for 4-6 weeks. PICC line per Dr. Sampson Goon.  PAD.  He got angiogram and Percutaneous transluminal angioplasty of right lower extremity on 05/07/2016. -Continue Brilinta and aspirin  2. History of COPD. Stable. -Continue oral inhalers and  nebs  3. Hypertension continue home meds  4. Tobacco abuse discussed smoking cessation more than 4 minutes spent.  5. Diabetes   Had hypoglycemia, required Inj D50 05/03/16- so stopped lantus. On Sliding scale. BS is elevated, increase Lantus to 10 units at bedtime.  6. CKD stage II. Stable.  Anemia, possible due to chronic disease. Follow-up stool occult. Hyponatremia. Started normal saline IV. Follow up BMP.  Discussed with Dr. Sampson Goon.  All the records are reviewed and case discussed with Care Management/Social Workerr. Management plans discussed with the patient, his wife and they are in agreement.  CODE STATUS: Full.  TOTAL TIME TAKING CARE OF THIS PATIENT: 41 minutes.     POSSIBLE D/C IN 2-3 DAYS, DEPENDING ON CLINICAL CONDITION.   Shaune Pollack M.D on 05/08/2016   Between 7am to 6pm - Pager - [phone]  After 6pm go to [email or web address] - password [email or web address]  [facility]  Office  [phone]  CC: Primary care physician; Emogene Morgan, MD  Note: This dictation was prepared with Dragon dictation along with smaller phrase technology. Any transcriptional errors that result from this process are unintentional.

## 2016-05-08 NOTE — Progress Notes (Signed)
Pharmacy Antibiotic Note  Damon [name] is a 66 y.o. male admitted on 05/03/2016 with Right foot ulcer/wound infection.  Cultures growing MODERATE ENTEROBACTER CLOACAE  And FEW GROUP B STREP.  Pharmacy has been consulted for Ciprofloxacin dosing. Pt also on Augmentin. Pt for further irrigation and debridement of foot currently.  Plan: Continue ciprofloxacin 400mg  IV every 12 hours.   Height: 5\' 10"  (177.8 cm) Weight: 160 lb (72.6 kg) IBW/kg (Calculated) : 73  Temp (24hrs), Avg:100.3 F (37.9 C), Min:98.7 F (37.1 C), Max:103 F (39.4 C)   Recent Labs Lab 05/03/16 1631 05/05/16 0353 05/06/16 0325 05/06/16 1559 05/07/16 0423 05/08/16 0638  WBC 17.0*  --  16.2*  --  16.5* 18.3*  CREATININE 1.24 1.39*  --  1.01 1.31*  --   LATICACIDVEN 1.7  --   --   --   --   --   VANCOTROUGH  --   --  24* 18  --   --     Estimated Creatinine Clearance: 58.5 mL/min (by C-G formula based on SCr of 1.31 mg/dL (H)).    No Known Allergies  Antimicrobials this admission: 1/25 vancomycin  >> 1/28 1/26 Zosyn >> 1/28 1/28 ciprofloxacin>> 1/29 augmentin>>  Dose adjustments this admission:   Microbiology results: Wound ZO:[id number]:MODERATE ENTEROBACTER CLOACAE  FEW GROUP B STREP 1/25 BCx: NG x 3 days  Thank you for allowing pharmacy to be a part of this patient's care.  Olene FlossMelissa D Hazley Dezeeuw, PharmD, BCPS Clinical Pharmacist 05/08/2016 2:30 PM

## 2016-05-08 NOTE — Care Management (Addendum)
OR today. Found to have gangrene and osteomyelitis. Debridement completed.  Will speak with patient regarding discharge planning.  4:22- Noted Dr. Sampson GoonFitzgerald note. Possible need for IV abx. Following progression.

## 2016-05-08 NOTE — Progress Notes (Signed)
Patient ID: Damon [name], male   DOB: 16-Sep-1950, 66 y.o.   MRN: [id number] Spoke with MRI department concerning the patient's MRI. Stated it would be sometime this afternoon. They recommended changing order to stat as the patient is currently nothing by mouth and there is operating room time available this afternoon. Plan for surgery this afternoon. Discussed with the patient possible risks and complications including inability to heal as well as possibility of needing higher amputation due to potential for nonhealing or continued infection.

## 2016-05-08 NOTE — Consult Note (Signed)
[facility] Infectious Disease     Reason for Consult: Osteomyelitis    Referring Physician: Laban Emperor Date of Admission:  05/03/2016   Active Problems:   Right foot ulcer (HCC)   HPI: Damon [name] is a 66 y.o. male admitted with infection of R foot. He has PAD as well as ongoing tobaco abuse. And on admit had foul smelling uler of R foot. His wbc was 17 K, temp 100.1.  Pt had revascularization 1/29 and then seen by podiatry and had debridement. MRI shows abscess and osteomyelitis. Cx from wound growing enterobacter, grp B strep. Surgical cx pending. S/p I and D 1/30 with cx pending  Past Medical History:  Diagnosis Date  . CKD (chronic kidney disease), stage III   . COPD (chronic obstructive pulmonary disease) (HCC)   . Diabetes mellitus without complication (HCC)   . Diverticulitis   . Essential hypertension   . MI (myocardial infarction)   . PAD (peripheral artery disease) (HCC)    a. 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  . Tobacco abuse    Past Surgical History:  Procedure Laterality Date  . CARDIAC CATHETERIZATION N/A 01/13/2016   Procedure: LEFT HEART CATH AND CORONARY ANGIOGRAPHY;  Surgeon: Iran Ouch, MD;  Location: [facility] IN[facility];  Service: Cardiovascular;  Laterality: N/A;  . CARDIAC CATHETERIZATION N/A 01/13/2016   Procedure: Coronary Stent Intervention;  Surgeon: Iran Ouch, MD;  Location: [facility];  Service: Cardiovascular;  Laterality: N/A;  . CARDIAC CATHETERIZATION N/A 01/16/2016   Procedure: Left Heart Cath and Coronary Angiography;  Surgeon: Iran Ouch, MD;  Location: [facility];  Service: Cardiovascular;  Laterality: N/A;  . NECK SURGERY    . PERIPHERAL VASCULAR CATHETERIZATION Left 11/01/2015   Procedure: Lower Extremity Angiography;  Surgeon: Renford Dills, MD;  Location: [facility];  Service: Cardiovascular;  Laterality: Left;  . PERIPHERAL VASCULAR  CATHETERIZATION Right 05/07/2016   Procedure: Lower Extremity Angiography;  Surgeon: Annice Needy, MD;  Location: [facility];  Service: Cardiovascular;  Laterality: Right;   Social History  Substance Use Topics  . Smoking status: Current Every Day Smoker    Packs/day: 0.50    Years: 15.00    Types: Cigarettes  . Smokeless tobacco: Current User  . Alcohol use No   Family History  Problem Relation Age of Onset  . Intracerebral hemorrhage Mother   . Diabetes Mother   . Cancer Mother   . Diabetes Father     Allergies: No Known Allergies  Current antibiotics: Antibiotics Given (last 72 hours)    Date/Time Action Medication Dose Rate   05/05/16 1713 Given   vancomycin (VANCOCIN) IVPB 750 mg/150 ml premix 750 mg 150 mL/hr   05/05/16 2128 Given   piperacillin-tazobactam (ZOSYN) IVPB 3.375 g 3.375 g 12.5 mL/hr   05/06/16 0316 Given   vancomycin (VANCOCIN) IVPB 750 mg/150 ml premix 750 mg 150 mL/hr   05/06/16 0524 Given   piperacillin-tazobactam (ZOSYN) IVPB 3.375 g 3.375 g 12.5 mL/hr   05/06/16 1315 Given   piperacillin-tazobactam (ZOSYN) IVPB 3.375 g 3.375 g 12.5 mL/hr   05/06/16 1843 Given   ciprofloxacin (CIPRO) IVPB 400 mg 400 mg 200 mL/hr   05/07/16 0744 Given   ciprofloxacin (CIPRO) IVPB 400 mg 400 mg 200 mL/hr   05/07/16 0940 Given   ceFAZolin (ANCEF) IVPB 1 g/50 mL premix 1 g 100 mL/hr   05/07/16  1526 Given   amoxicillin-clavulanate (AUGMENTIN) 875-125 MG per tablet 1 tablet 1 tablet    05/07/16 1749 Given   ciprofloxacin (CIPRO) IVPB 400 mg 400 mg 200 mL/hr   05/07/16 2125 Given   amoxicillin-clavulanate (AUGMENTIN) 875-125 MG per tablet 1 tablet 1 tablet    05/08/16 0907 Given   ciprofloxacin (CIPRO) IVPB 400 mg 400 mg 200 mL/hr   05/08/16 1315 Given   ceFAZolin (ANCEF) IVPB 1 g/50 mL premix 1 g    05/08/16 1404 Given  [mixed with stimulan to make antibiotic beads to go to right foot.]   vancomycin (VANCOCIN) powder 1,000 mg       MEDICATIONS: .  amoxicillin-clavulanate  1 tablet Oral Q12H  . ARIPiprazole  15 mg Oral Daily  . aspirin EC  81 mg Oral Daily  . atorvastatin  80 mg Oral q1800  . carvedilol  6.25 mg Oral BID WC  . ciprofloxacin  400 mg Intravenous Q12H  . enoxaparin (LOVENOX) injection  40 mg Subcutaneous Q24H  . gabapentin  300 mg Oral Daily  .  HYDROmorphone (DILAUDID) injection  1 mg Intravenous Once  . insulin aspart  0-9 Units Subcutaneous TID WC  . insulin glargine  10 Units Subcutaneous QHS  . senna  1 tablet Oral BID  . ticagrelor  90 mg Oral BID    Review of Systems - 11 systems reviewed and negative per HPI   OBJECTIVE: Temp:  [98.7 F (37.1 C)-103 F (39.4 C)] 99.3 F (37.4 C) (01/30 1524) Pulse Rate:  [79-97] 91 (01/30 1524) Resp:  [16-36] 16 (01/30 1524) BP: (112-167)/(53-69) 126/53 (01/30 1524) SpO2:  [96 %-100 %] 99 % (01/30 1524) Physical Exam  Constitutional: He is oriented to person, place, and time. Chronically ill appearing HENT: anicteric Mouth/Throat: Oropharynx is clear and moist. No oropharyngeal exudate. Poor dentition Cardiovascular: Normal rate, regular rhythm and normal heart sounds. Pulmonary/Chest: Effort normal and breath sounds normal. No respiratory distress. He has no wheezes.  Abdominal: Soft. Bowel sounds are normal. He exhibits no distension. There is no tenderness.  Lymphadenopathy:  He has no cervical adenopathy. Neurological: He is alert and oriented to person, place, and time.  Skin: R foot wrapped post op, skin above wrapping is dark and warm and swollen Psychiatric: He has a normal mood and affect. His behavior is normal.     LABS: Results for orders placed or performed during the hospital encounter of 05/03/16 (from the past 48 hour(s))  Glucose, capillary     Status: Abnormal   Collection Time: 05/06/16  4:32 PM  Result Value Ref Range   Glucose-Capillary 186 (H) 65 - 99 mg/dL   Comment 1 Notify RN    Comment 2 Document in Chart   Glucose, capillary      Status: Abnormal   Collection Time: 05/06/16  9:15 PM  Result Value Ref Range   Glucose-Capillary 172 (H) 65 - 99 mg/dL   Comment 1 Notify RN   CBC     Status: Abnormal   Collection Time: 05/07/16  4:23 AM  Result Value Ref Range   WBC 16.5 (H) 3.8 - 10.6 K/uL   RBC 2.50 (L) 4.40 - 5.90 MIL/uL   Hemoglobin 7.9 (L) 13.0 - 18.0 g/dL   HCT 22.8 (L) 40.0 - 52.0 %   MCV 91.2 80.0 - 100.0 fL   MCH 31.7 26.0 - 34.0 pg   MCHC 34.7 32.0 - 36.0 g/dL   RDW 14.6 (H) 11.5 - 14.5 %   Platelets  449 (H) 150 - 440 K/uL  Basic metabolic panel     Status: Abnormal   Collection Time: 05/07/16  4:23 AM  Result Value Ref Range   Sodium 132 (L) 135 - 145 mmol/L   Potassium 3.9 3.5 - 5.1 mmol/L   Chloride 104 101 - 111 mmol/L   CO2 22 22 - 32 mmol/L   Glucose, Bld 154 (H) 65 - 99 mg/dL   BUN 20 6 - 20 mg/dL   Creatinine, Ser 1.31 (H) 0.61 - 1.24 mg/dL   Calcium 8.0 (L) 8.9 - 10.3 mg/dL   GFR calc non Af Amer 56 (L) >60 mL/min   GFR calc Af Amer >60 >60 mL/min    Comment: (NOTE) The eGFR has been calculated using the CKD EPI equation. This calculation has not been validated in all clinical situations. eGFR's persistently <60 mL/min signify possible Chronic Kidney Disease.    Anion gap 6 5 - 15  Glucose, capillary     Status: Abnormal   Collection Time: 05/07/16  7:47 AM  Result Value Ref Range   Glucose-Capillary 162 (H) 65 - 99 mg/dL   Comment 1 Notify RN    Comment 2 Document in Chart   Glucose, capillary     Status: Abnormal   Collection Time: 05/07/16  9:36 AM  Result Value Ref Range   Glucose-Capillary 196 (H) 65 - 99 mg/dL  Glucose, capillary     Status: Abnormal   Collection Time: 05/07/16  4:00 PM  Result Value Ref Range   Glucose-Capillary 201 (H) 65 - 99 mg/dL   Comment 1 Notify RN    Comment 2 Document in Chart   Glucose, capillary     Status: Abnormal   Collection Time: 05/07/16  9:39 PM  Result Value Ref Range   Glucose-Capillary 173 (H) 65 - 99 mg/dL   Comment 1 Notify  RN   CBC     Status: Abnormal   Collection Time: 05/08/16  6:38 AM  Result Value Ref Range   WBC 18.3 (H) 3.8 - 10.6 K/uL   RBC 2.45 (L) 4.40 - 5.90 MIL/uL   Hemoglobin 7.6 (L) 13.0 - 18.0 g/dL   HCT 22.6 (L) 40.0 - 52.0 %   MCV 92.6 80.0 - 100.0 fL   MCH 31.1 26.0 - 34.0 pg   MCHC 33.5 32.0 - 36.0 g/dL   RDW 14.5 11.5 - 14.5 %   Platelets 460 (H) 150 - 440 K/uL  Glucose, capillary     Status: Abnormal   Collection Time: 05/08/16  7:34 AM  Result Value Ref Range   Glucose-Capillary 148 (H) 65 - 99 mg/dL   Comment 1 Notify RN    Comment 2 Document in Chart   Glucose, capillary     Status: Abnormal   Collection Time: 05/08/16 11:19 AM  Result Value Ref Range   Glucose-Capillary 149 (H) 65 - 99 mg/dL   Comment 1 Notify RN    Comment 2 Document in Chart   Glucose, capillary     Status: Abnormal   Collection Time: 05/08/16 12:35 PM  Result Value Ref Range   Glucose-Capillary 160 (H) 65 - 99 mg/dL  Glucose, capillary     Status: Abnormal   Collection Time: 05/08/16  2:45 PM  Result Value Ref Range   Glucose-Capillary 143 (H) 65 - 99 mg/dL   No components found for: ESR, C REACTIVE PROTEIN MICRO: Recent Results (from the past 720 hour(s))  Wound or Superficial Culture  Status: None   Collection Time: 05/03/16  4:31 PM  Result Value Ref Range Status   Specimen Description WOUND  Final   Special Requests NONE  Final   Gram Stain   Final    RARE WBC PRESENT, PREDOMINANTLY MONONUCLEAR FEW GRAM POSITIVE COCCI IN PAIRS RARE GRAM VARIABLE ROD    Culture   Final    MODERATE ENTEROBACTER CLOACAE FEW GROUP B STREP(S.AGALACTIAE)ISOLATED TESTING AGAINST S. AGALACTIAE NOT ROUTINELY PERFORMED DUE TO PREDICTABILITY OF AMP/PEN/VAN SUSCEPTIBILITY. Performed at [facility], [address] [place], [place] [address]    Report Status 05/06/2016 FINAL  Final   Organism ID, Bacteria ENTEROBACTER CLOACAE  Final      Susceptibility   Enterobacter cloacae - MIC*    CEFAZOLIN >=64  RESISTANT Resistant     CEFEPIME <=1 SENSITIVE Sensitive     CEFTAZIDIME <=1 SENSITIVE Sensitive     CEFTRIAXONE <=1 SENSITIVE Sensitive     CIPROFLOXACIN <=0.25 SENSITIVE Sensitive     GENTAMICIN <=1 SENSITIVE Sensitive     IMIPENEM <=0.25 SENSITIVE Sensitive     TRIMETH/SULFA <=20 SENSITIVE Sensitive     PIP/TAZO <=4 SENSITIVE Sensitive     * MODERATE ENTEROBACTER CLOACAE  Blood culture (routine x 2)     Status: None   Collection Time: 05/03/16  4:31 PM  Result Value Ref Range Status   Specimen Description BLOOD RIGHT HAND  Final   Special Requests   Final    BOTTLES DRAWN AEROBIC AND ANAEROBIC AER ANA   Culture NO GROWTH 5 DAYS  Final   Report Status 05/08/2016 FINAL  Final  Blood culture (routine x 2)     Status: None   Collection Time: 05/03/16  4:31 PM  Result Value Ref Range Status   Specimen Description BLOOD RIGHT FOREARM  Final   Special Requests   Final    BOTTLES DRAWN AEROBIC AND ANAEROBIC AER ANA   Culture NO GROWTH 5 DAYS  Final   Report Status 05/08/2016 FINAL  Final    IMAGING: Dg Chest 2 View  Result Date: 05/03/2016 CLINICAL DATA:  Shortness of breath with fever. EXAM: CHEST  2 VIEW COMPARISON:  04/27/2016 FINDINGS: The heart size and mediastinal contours are within normal limits. Both lungs are clear. The visualized skeletal structures are unremarkable. IMPRESSION: No active cardiopulmonary disease. Electronically Signed   By: Kennith Center M.D.   On: 05/03/2016 17:37   Ct Head Wo Contrast  Result Date: 04/27/2016 CLINICAL DATA:  Altered mental status/ confusion.  Hypoglycemia. EXAM: CT HEAD WITHOUT CONTRAST TECHNIQUE: Contiguous axial images were obtained from the base of the skull through the vertex without intravenous contrast. COMPARISON:  January 13, 2016 FINDINGS: Brain: There is mild diffuse atrophy. There is no intracranial mass, hemorrhage, extra-axial fluid collection, or midline shift. There is slight small vessel disease in the  centra semiovale bilaterally. Elsewhere gray-white compartments appear normal. No evident acute infarct. Vascular: No hyperdense vessel. There are scattered foci of calcification in the carotid siphon regions. Skull: Bony calvarium appears intact. Sinuses/Orbits: Air-fluid level in the visualized left maxillary antrum. There is mucosal thickening in several ethmoid air cells. Other visualized paranasal sinuses are clear. Visualized orbits appear symmetric bilaterally. Other: Mastoid air cells are clear. IMPRESSION: Mild atrophy with rather minimal periventricular small vessel disease. No intracranial mass, hemorrhage, or extra-axial fluid collection. No acute appearing infarct. Scattered foci of carotid artery calcification. Air-fluid level noted in visualize left maxillary antrum consistent with a degree  of acute sinusitis. Mucosal thickening noted in several ethmoid air cells bilaterally. Electronically Signed   By: Lowella Grip III M.D.   On: 04/27/2016 09:35   Mr Foot Right Wo Contrast  Result Date: 05/08/2016 CLINICAL DATA:  Open wound involving the base of the fifth metatarsal. Diabetic. EXAM: MRI OF THE RIGHT FOREFOOT WITHOUT CONTRAST TECHNIQUE: Multiplanar, multisequence MR imaging of the right foot was performed. No intravenous contrast was administered. COMPARISON:  Radiographs 05/03/2016 FINDINGS: Diffuse subcutaneous soft tissue swelling/edema/ fluid consistent with cellulitis. There is diffuse edema like signal abnormality in the foot musculature consistent with myofasciitis. Large open wound is noted on the lateral aspect of the foot in the region of the base of the fifth metatarsal. There is some underlying fluid. There is also fluid tracking along the plantar aspect of the fourth metatarsal which could be an abscess. Difficult to be certain without contrast. T2 and STIR signal abnormality in the base of the fifth metatarsal and also along the lateral margin of the cuboid suspicious for  osteomyelitis. No definite MR findings for septic arthritis. IMPRESSION: 1. Large open wound along the lateral aspect of the midfoot in the region of the base of the fifth metatarsal. This appears to extend right down to the bone. 2. Diffuse cellulitis and myofasciitis. 3. Fluid collection tracking from the area of the open wound along the plantar aspect of the fourth metatarsal is worrisome for an abscess. 4. Findings suspicious for osteomyelitis involving the base of the fifth metatarsal and the lateral aspect of the cuboid. Electronically Signed   By: Marijo Sanes M.D.   On: 05/08/2016 11:10   Dg Chest Portable 1 View  Result Date: 04/27/2016 CLINICAL DATA:  Smoker, confusion EXAM: PORTABLE CHEST 1 VIEW COMPARISON:  03/27/2016 FINDINGS: Cardiomediastinal silhouette is stable. No infiltrate or pleural effusion. No pulmonary edema. Mild degenerative changes bilateral acromioclavicular joints. Partially visualized metallic fixation plate cervical spine. IMPRESSION: No active disease. Electronically Signed   By: Lahoma Crocker M.D.   On: 04/27/2016 10:28   Dg Foot Complete Right  Result Date: 05/03/2016 CLINICAL DATA:  Assess right lateral foot ulceration. Current history of diabetes. Initial encounter. EXAM: RIGHT FOOT COMPLETE - 3+ VIEW COMPARISON:  None. FINDINGS: There is no evidence of fracture or dislocation. There is no evidence of osseous erosion. The joint spaces are preserved. There is no evidence of talar subluxation; the subtalar joint is unremarkable in appearance. Small plantar and posterior calcaneal spurs are seen. The known soft tissue ulceration is not well characterized on radiograph. No radiopaque foreign bodies are seen. IMPRESSION: Known soft tissue ulceration not well characterized on radiograph. No radiopaque foreign bodies seen. No evidence of osseous erosion. Electronically Signed   By: Garald Balding M.D.   On: 05/03/2016 17:42    Assessment:   KELLEN DUTCH is a 66 y.o. male with  DM, PVD, ongoing smoking admitted with R foot infection and MRI evidence of osteomyelitis and abscess. Cx mixed with enterobacter and Grp B strep. Surgical cx pending.  Will need PICC and several weeks of IV abx. Would treat broadly given foot infection and gangrene and not tailor to just the strep and enterococcus. No MRSA found yet.   Recommendations Check esr and ctp  Change back to zosyn No need for Vanco unless MRSA grows on cx Will need picc and iv abx (see IV abx order sheet) Thank you very much for allowing me to participate in the care of this patient. Please call with questions.  Cheral Marker. Ola Spurr, MD

## 2016-05-08 NOTE — Anesthesia Preprocedure Evaluation (Signed)
Anesthesia Evaluation  Patient identified by MRN, date of birth, ID band Patient awake    Reviewed: Allergy & Precautions, H&P , NPO status , Patient's Chart, lab work & pertinent test results  History of Anesthesia Complications Negative for: history of anesthetic complications  Airway Mallampati: III  TM Distance: >3 FB Neck ROM: limited    Dental  (+) Poor Dentition, Chipped, Missing   Pulmonary neg shortness of breath, COPD, Current Smoker,    Pulmonary exam normal breath sounds clear to auscultation       Cardiovascular Exercise Tolerance: Good hypertension, (-) angina+ CAD, + Past MI, + Cardiac Stents and + Peripheral Vascular Disease  (-) DOE Normal cardiovascular exam Rhythm:regular Rate:Normal     Neuro/Psych negative neurological ROS  negative psych ROS   GI/Hepatic negative GI ROS, Neg liver ROS,   Endo/Other  diabetes, Poorly Controlled, Type 2, Insulin Dependent  Renal/GU Renal disease     Musculoskeletal   Abdominal   Peds  Hematology negative hematology ROS (+)   Anesthesia Other Findings Past Medical History: No date: CKD (chronic kidney disease), stage III No date: COPD (chronic obstructive pulmonary disease) (* No date: Diabetes mellitus without complication (HCC) No date: Diverticulitis No date: Essential hypertension No date: MI (myocardial infarction) No date: PAD (peripheral artery disease) (HCC)     Comment: a. 10/2015 s/p PTA and stenting of the RCIA,               LCIA, and PTA of the [employer] Environmental manager(Schnier). No date: Tobacco abuse  Past Surgical History: 01/13/2016: CARDIAC CATHETERIZATION N/A     Comment: Procedure: LEFT HEART CATH AND CORONARY               ANGIOGRAPHY;  Surgeon: Iran OuchMuhammad A Arida, MD;                Location: [facility];  Service:               Cardiovascular;  Laterality: N/A; 01/13/2016: CARDIAC CATHETERIZATION N/A     Comment: Procedure: Coronary Stent  Intervention;                Surgeon: Iran OuchMuhammad A Arida, MD;  Location: [facility]               INVASIVE CV LAB;  Service: Cardiovascular;                Laterality: N/A; 01/16/2016: CARDIAC CATHETERIZATION N/A     Comment: Procedure: Left Heart Cath and Coronary               Angiography;  Surgeon: Iran OuchMuhammad A Arida, MD;                Location: [facility];  Service:               Cardiovascular;  Laterality: N/A; No date: NECK SURGERY 11/01/2015: PERIPHERAL VASCULAR CATHETERIZATION Left     Comment: Procedure: Lower Extremity Angiography;                Surgeon: Renford DillsGregory G Schnier, MD;  Location: [facility]              INVASIVE CV LAB;  Service: Cardiovascular;                Laterality: Left; 05/07/2016: PERIPHERAL VASCULAR CATHETERIZATION Right     Comment: Procedure: Lower Extremity Angiography;                Surgeon:  Annice Needy, MD;  Location: [facility]               INVASIVE CV LAB;  Service: Cardiovascular;                Laterality: Right;  BMI    Body Mass Index:  22.96 kg/m      Reproductive/Obstetrics negative OB ROS                             Anesthesia Physical Anesthesia Plan  ASA: III  Anesthesia Plan: General ETT   Post-op Pain Management:    Induction:   Airway Management Planned:   Additional Equipment:   Intra-op Plan:   Post-operative Plan:   Informed Consent: I have reviewed the patients History and Physical, chart, labs and discussed the procedure including the risks, benefits and alternatives for the proposed anesthesia with the patient or authorized representative who has indicated his/her understanding and acceptance.   Dental Advisory Given  Plan Discussed with: Anesthesiologist, CRNA and Surgeon  Anesthesia Plan Comments:         Anesthesia Quick Evaluation

## 2016-05-08 NOTE — Anesthesia Post-op Follow-up Note (Cosign Needed)
Anesthesia QCDR form completed.        

## 2016-05-08 NOTE — Progress Notes (Signed)
Patient possibly having surgery today. NPO since 5am. MD ordered RN to only give beta blockers with meds. All other oral medications were held.   Harvie HeckMelanie Aja Bolander, RN

## 2016-05-08 NOTE — H&P (View-Only) (Signed)
Reason for Consult: Chronic ulceration with gangrenous changes right foot Referring Physician: Hospitalist  Damon [name] is an 66 y.o. male.  HPI: The patient is known to me outpatient where he has been seen previously for an ulceration on his right lower extremity. He was set up for an outpatient vascular exam which was scheduled for today. In the interim over the weekend he was admitted for some fever and chills with infection and gangrenous changes on his right foot. Underwent revascularization procedure earlier today.  Past Medical History:  Diagnosis Date  . CKD (chronic kidney disease), stage III   . COPD (chronic obstructive pulmonary disease) ([place])   . Diabetes mellitus without complication ([place])   . Diverticulitis   . Essential hypertension   . MI (myocardial infarction)   . PAD (peripheral artery disease) ([place])    a. 10/2015 s/p PTA and stenting of the RCIA, LCIA, and PTA of the [facility] [employer]).  . Tobacco abuse     Past Surgical History:  Procedure Laterality Date  . CARDIAC CATHETERIZATION N/A 01/13/2016   Procedure: LEFT HEART CATH AND CORONARY ANGIOGRAPHY;  Surgeon: Wellington Hampshire, MD;  Location: [facility];  Service: Cardiovascular;  Laterality: N/A;  . CARDIAC CATHETERIZATION N/A 01/13/2016   Procedure: Coronary Stent Intervention;  Surgeon: Wellington Hampshire, MD;  Location: [facility];  Service: Cardiovascular;  Laterality: N/A;  . CARDIAC CATHETERIZATION N/A 01/16/2016   Procedure: Left Heart Cath and Coronary Angiography;  Surgeon: Wellington Hampshire, MD;  Location: [facility];  Service: Cardiovascular;  Laterality: N/A;  . NECK SURGERY    . PERIPHERAL VASCULAR CATHETERIZATION Left 11/01/2015   Procedure: Lower Extremity Angiography;  Surgeon: Katha Cabal, MD;  Location: [facility];  Service: Cardiovascular;  Laterality: Left;    Family History  Problem Relation Age of Onset  . Intracerebral hemorrhage Mother   . Diabetes  Mother   . Cancer Mother   . Diabetes Father     Social History:  reports that he has been smoking Cigarettes.  He has a 7.50 pack-year smoking history. He uses smokeless tobacco. He reports that he does not drink alcohol or use drugs.  Allergies: No Known Allergies  Medications:  Scheduled: . amoxicillin-clavulanate  1 tablet Oral Q12H  . ARIPiprazole  15 mg Oral Daily  . aspirin EC  81 mg Oral Daily  . atorvastatin  80 mg Oral q1800  . carvedilol  6.25 mg Oral BID WC  . ciprofloxacin  400 mg Intravenous Q12H  . enoxaparin (LOVENOX) injection  40 mg Subcutaneous Q24H  . gabapentin  300 mg Oral Daily  .  HYDROmorphone (DILAUDID) injection  1 mg Intravenous Once  . insulin aspart  0-9 Units Subcutaneous TID WC  . insulin glargine  10 Units Subcutaneous QHS  . senna  1 tablet Oral BID  . ticagrelor  90 mg Oral BID    Results for orders placed or performed during the hospital encounter of 05/03/16 (from the past 48 hour(s))  Glucose, capillary     Status: Abnormal   Collection Time: 05/05/16  9:38 PM  Result Value Ref Range   Glucose-Capillary 143 (H) 65 - 99 mg/dL   Comment 1 Notify RN   Vancomycin, trough     Status: Abnormal   Collection Time: 05/06/16  3:25 AM  Result Value Ref Range   Vancomycin Tr 24 (HH) 15 - 20 ug/mL    Comment: CRITICAL RESULT CALLED TO, READ BACK  BY AND VERIFIED WITH MATT MCBANE AT [place] 05/06/16.PMH  CBC     Status: Abnormal   Collection Time: 05/06/16  3:25 AM  Result Value Ref Range   WBC 16.2 (H) 3.8 - 10.6 K/uL   RBC 2.60 (L) 4.40 - 5.90 MIL/uL   Hemoglobin 8.3 (L) 13.0 - 18.0 g/dL   HCT 23.7 (L) 40.0 - 52.0 %   MCV 91.3 80.0 - 100.0 fL   MCH 31.9 26.0 - 34.0 pg   MCHC 34.9 32.0 - 36.0 g/dL   RDW 14.9 (H) 11.5 - 14.5 %   Platelets 411 150 - 440 K/uL  Glucose, capillary     Status: Abnormal   Collection Time: 05/06/16  7:37 AM  Result Value Ref Range   Glucose-Capillary 174 (H) 65 - 99 mg/dL   Comment 1 Notify RN    Comment 2 Document in  Chart   Glucose, capillary     Status: Abnormal   Collection Time: 05/06/16 11:03 AM  Result Value Ref Range   Glucose-Capillary 225 (H) 65 - 99 mg/dL  Glucose, capillary     Status: Abnormal   Collection Time: 05/06/16  1:02 PM  Result Value Ref Range   Glucose-Capillary 206 (H) 65 - 99 mg/dL   Comment 1 Document in Chart   Vancomycin, trough     Status: None   Collection Time: 05/06/16  3:59 PM  Result Value Ref Range   Vancomycin Tr 18 15 - 20 ug/mL  BUN     Status: None   Collection Time: 05/06/16  3:59 PM  Result Value Ref Range   BUN 20 6 - 20 mg/dL  Creatinine, serum     Status: None   Collection Time: 05/06/16  3:59 PM  Result Value Ref Range   Creatinine, Ser 1.01 0.61 - 1.24 mg/dL   GFR calc non Af Amer >60 >60 mL/min   GFR calc Af Amer >60 >60 mL/min    Comment: (NOTE) The eGFR has been calculated using the CKD EPI equation. This calculation has not been validated in all clinical situations. eGFR's persistently <60 mL/min signify possible Chronic Kidney Disease.   Glucose, capillary     Status: Abnormal   Collection Time: 05/06/16  4:32 PM  Result Value Ref Range   Glucose-Capillary 186 (H) 65 - 99 mg/dL   Comment 1 Notify RN    Comment 2 Document in Chart   Glucose, capillary     Status: Abnormal   Collection Time: 05/06/16  9:15 PM  Result Value Ref Range   Glucose-Capillary 172 (H) 65 - 99 mg/dL   Comment 1 Notify RN   CBC     Status: Abnormal   Collection Time: 05/07/16  4:23 AM  Result Value Ref Range   WBC 16.5 (H) 3.8 - 10.6 K/uL   RBC 2.50 (L) 4.40 - 5.90 MIL/uL   Hemoglobin 7.9 (L) 13.0 - 18.0 g/dL   HCT 22.8 (L) 40.0 - 52.0 %   MCV 91.2 80.0 - 100.0 fL   MCH 31.7 26.0 - 34.0 pg   MCHC 34.7 32.0 - 36.0 g/dL   RDW 14.6 (H) 11.5 - 14.5 %   Platelets 449 (H) 150 - 440 K/uL  Basic metabolic panel     Status: Abnormal   Collection Time: 05/07/16  4:23 AM  Result Value Ref Range   Sodium 132 (L) 135 - 145 mmol/L   Potassium 3.9 3.5 - 5.1 mmol/L    Chloride 104 101 - 111 mmol/L  CO2 22 22 - 32 mmol/L   Glucose, Bld 154 (H) 65 - 99 mg/dL   BUN 20 6 - 20 mg/dL   Creatinine, Ser 1.31 (H) 0.61 - 1.24 mg/dL   Calcium 8.0 (L) 8.9 - 10.3 mg/dL   GFR calc non Af Amer 56 (L) >60 mL/min   GFR calc Af Amer >60 >60 mL/min    Comment: (NOTE) The eGFR has been calculated using the CKD EPI equation. This calculation has not been validated in all clinical situations. eGFR's persistently <60 mL/min signify possible Chronic Kidney Disease.    Anion gap 6 5 - 15  Glucose, capillary     Status: Abnormal   Collection Time: 05/07/16  7:47 AM  Result Value Ref Range   Glucose-Capillary 162 (H) 65 - 99 mg/dL   Comment 1 Notify RN    Comment 2 Document in Chart   Glucose, capillary     Status: Abnormal   Collection Time: 05/07/16  9:36 AM  Result Value Ref Range   Glucose-Capillary 196 (H) 65 - 99 mg/dL  Glucose, capillary     Status: Abnormal   Collection Time: 05/07/16  4:00 PM  Result Value Ref Range   Glucose-Capillary 201 (H) 65 - 99 mg/dL   Comment 1 Notify RN    Comment 2 Document in Chart     No results found.  Review of Systems  Constitutional: Negative for chills and fever.  HENT: Negative.   Eyes: Negative.   Respiratory: Negative.   Cardiovascular: Negative.   Gastrointestinal: Negative for nausea and vomiting.  Genitourinary: Negative.   Musculoskeletal: Negative.   Skin:       Relates a chronic ulceration on the side of his right foot.  Neurological:       Does relate some numbness and neuropathy in the feet related to his diabetes  Endo/Heme/Allergies: Negative.   Psychiatric/Behavioral: Negative.    Blood pressure (!) 156/64, pulse 97, temperature (!) 103 F (39.4 C), temperature source Oral, resp. rate 19, height _0  (1.778 m), weight 72.6 kg (160 lb), SpO2 98 %. Physical Exam  Cardiovascular:  DP pulse is diminished, trace bilateral. PT pulse is 1 over 4 on the right and nonpalpable on the left.   Musculoskeletal:  Stiff and limited range of motion in the pedal joints. Muscle testing is deferred.  Neurological:  Loss of protective threshold with monofilament wire bilateral. Proprioception is impaired.  Skin:  The skin is thin dry and atrophic with bilateral hyperpigmentation's and absent hair growth. Some edema noted in the right foot and lower leg as compared to the left. Large eschar along the lateral aspect of the right midfoot. A dorsal superficial blister formation is present over the midfoot. On debridement of the lateral aspect of the foot there is an abscess with some underlying necrotic tissue and purulence which was expressed. Evidence of exposed peroneal tendon is noted with probing to the bone at the fifth metatarsal base area.    Assessment/Plan: Assessment: 1. Full-thickness ulceration right foot with gangrenous changes. 2. Peripheral vascular disease. 3. Diabetes with associated neuropathy.  Plan: Excisional debridement of the ulceration on the lateral right foot was performed with sharp excision using a 15 blade and tissue nippers. Superficial and deep subcutaneous tissue with some of the necrosis was debrided out down to the level of tendon and bone. A wet-to-dry dressing was then applied to the wound followed by a bulky Kerlix bandage. Discussed with the patient that he will need some type  of debridement. We will order an MRI for evaluation of the extent of the infection and abscess. Pending the results of the MRI we will plan for debridement in the next day or 2  Durward Fortes 05/07/2016, 9:22 PM

## 2016-05-08 NOTE — Progress Notes (Signed)
Infectious Disease Long Term IV Antibiotic Orders  Diagnosis: PAD and DM foot infection on R, with osteo S/P I and D 1/30.   Culture results Results for orders placed or performed during the hospital encounter of 05/03/16  Wound or Superficial Culture     Status: None   Collection Time: 05/03/16  4:31 PM  Result Value Ref Range Status   Specimen Description WOUND  Final   Special Requests NONE  Final   Gram Stain   Final    RARE WBC PRESENT, PREDOMINANTLY MONONUCLEAR FEW GRAM POSITIVE COCCI IN PAIRS RARE GRAM VARIABLE ROD    Culture   Final    MODERATE ENTEROBACTER CLOACAE FEW GROUP B STREP(S.AGALACTIAE)ISOLATED TESTING AGAINST S. AGALACTIAE NOT ROUTINELY PERFORMED DUE TO PREDICTABILITY OF AMP/PEN/VAN SUSCEPTIBILITY. Performed at [facility], [place] [address], [place], [place] [address]    Report Status 05/06/2016 FINAL  Final   Organism ID, Bacteria ENTEROBACTER CLOACAE  Final      Susceptibility   Enterobacter cloacae - MIC*    CEFAZOLIN >=64 RESISTANT Resistant     CEFEPIME <=1 SENSITIVE Sensitive     CEFTAZIDIME <=1 SENSITIVE Sensitive     CEFTRIAXONE <=1 SENSITIVE Sensitive     CIPROFLOXACIN <=0.25 SENSITIVE Sensitive     GENTAMICIN <=1 SENSITIVE Sensitive     IMIPENEM <=0.25 SENSITIVE Sensitive     TRIMETH/SULFA <=20 SENSITIVE Sensitive     PIP/TAZO <=4 SENSITIVE Sensitive     * MODERATE ENTEROBACTER CLOACAE  Blood culture (routine x 2)     Status: None   Collection Time: 05/03/16  4:31 PM  Result Value Ref Range Status   Specimen Description BLOOD RIGHT HAND  Final   Special Requests   Final    BOTTLES DRAWN AEROBIC AND ANAEROBIC AER 6ML ANA 5ML   Culture NO GROWTH 5 DAYS  Final   Report Status 05/08/2016 FINAL  Final  Blood culture (routine x 2)     Status: None   Collection Time: 05/03/16  4:31 PM  Result Value Ref Range Status   Specimen Description BLOOD RIGHT FOREARM  Final   Special Requests   Final    BOTTLES DRAWN AEROBIC AND ANAEROBIC AER 15ML  ANA 14ML   Culture NO GROWTH 5 DAYS  Final   Report Status 05/08/2016 FINAL  Final     Allergies: No Known Allergies  Discharge antibiotics Zosyn 3.375 grams every 8 hours  PICC Care per protocol Labs weekly while on IV antibiotics FAX weekly labs to [phone]      CBC w diff   Comprehensive met panel CRP  ESR   Planned duration of antibiotics 4-6 weeks  Stop date 06/06/16 tentative Follow up clinic date 1-2 weeks     Leonel Ramsay, MD

## 2016-05-08 NOTE — Interval H&P Note (Signed)
History and Physical Interval Note:  05/08/2016 12:19 PM  Damon [name]  has presented today for surgery, with the diagnosis of n/a  The various methods of treatment have been discussed with the patient and family. After consideration of risks, benefits and other options for treatment, the patient has consented to  Procedure(s): IRRIGATION AND DEBRIDEMENT FOOT (Right) as a surgical intervention .  The patient's history has been reviewed, patient examined, no change in status, stable for surgery.  I have reviewed the patient's chart and labs.  Questions were answered to the patient's satisfaction.     Ricci Barkerodd W Lia Vigilante

## 2016-05-08 NOTE — Anesthesia Procedure Notes (Signed)
Procedure Name: Intubation Date/Time: 05/08/2016 1:20 PM Performed by: Justus [facility] Pre-anesthesia Checklist: Patient identified, Emergency Drugs available and Suction available Patient Re-evaluated:Patient Re-evaluated prior to inductionOxygen Delivery Method: Circle system utilized Preoxygenation: Pre-oxygenation with 100% oxygen Intubation Type: IV induction, Cricoid Pressure applied and Rapid sequence Laryngoscope Size: Mac and 3 Grade View: Grade III Tube type: Oral Tube size: 7.0 mm Number of attempts: 1 Airway Equipment and Method: Patient positioned with wedge pillow,  Stylet and Bougie stylet Placement Confirmation: positive ETCO2,  ETT inserted through vocal cords under direct vision and breath sounds checked- equal and bilateral Secured at: 20 cm Tube secured with: Tape Dental Injury: Teeth and Oropharynx as per pre-operative assessment  Difficulty Due To: Difficulty was anticipated Future Recommendations: Recommend- induction with short-acting agent, and alternative techniques readily available

## 2016-05-09 ENCOUNTER — Encounter: Payer: Self-pay | Admitting: Podiatry

## 2016-05-09 LAB — CBC WITH DIFFERENTIAL/PLATELET
Basophils Absolute: 0 10*3/uL (ref 0–0.1)
Basophils Relative: 0 %
Eosinophils Absolute: 0.1 10*3/uL (ref 0–0.7)
Eosinophils Relative: 1 %
HEMATOCRIT: 17.8 % — AB (ref 40.0–52.0)
HEMOGLOBIN: 6.3 g/dL — AB (ref 13.0–18.0)
LYMPHS ABS: 1.2 10*3/uL (ref 1.0–3.6)
Lymphocytes Relative: 7 %
MCH: 32.4 pg (ref 26.0–34.0)
MCHC: 35.5 g/dL (ref 32.0–36.0)
MCV: 91.3 fL (ref 80.0–100.0)
Monocytes Absolute: 1.3 10*3/uL — ABNORMAL HIGH (ref 0.2–1.0)
Monocytes Relative: 7 %
NEUTROS PCT: 85 %
Neutro Abs: 15.4 10*3/uL — ABNORMAL HIGH (ref 1.4–6.5)
Platelets: 442 10*3/uL — ABNORMAL HIGH (ref 150–440)
RBC: 1.95 MIL/uL — AB (ref 4.40–5.90)
RDW: 14.6 % — ABNORMAL HIGH (ref 11.5–14.5)
WBC: 18 10*3/uL — AB (ref 3.8–10.6)

## 2016-05-09 LAB — GLUCOSE, CAPILLARY
GLUCOSE-CAPILLARY: 141 mg/dL — AB (ref 65–99)
GLUCOSE-CAPILLARY: 180 mg/dL — AB (ref 65–99)
GLUCOSE-CAPILLARY: 188 mg/dL — AB (ref 65–99)
Glucose-Capillary: 136 mg/dL — ABNORMAL HIGH (ref 65–99)

## 2016-05-09 LAB — C-REACTIVE PROTEIN: CRP: 24 mg/dL — ABNORMAL HIGH (ref <1.0)

## 2016-05-09 LAB — BASIC METABOLIC PANEL
Anion gap: 6 (ref 5–15)
BUN: 20 mg/dL (ref 6–20)
CALCIUM: 7.7 mg/dL — AB (ref 8.9–10.3)
CHLORIDE: 107 mmol/L (ref 101–111)
CO2: 21 mmol/L — AB (ref 22–32)
Creatinine, Ser: 1.38 mg/dL — ABNORMAL HIGH (ref 0.61–1.24)
GFR calc non Af Amer: 53 mL/min — ABNORMAL LOW (ref >60)
Glucose, Bld: 143 mg/dL — ABNORMAL HIGH (ref 65–99)
Potassium: 4 mmol/L (ref 3.5–5.1)
Sodium: 134 mmol/L — ABNORMAL LOW (ref 135–145)

## 2016-05-09 LAB — SEDIMENTATION RATE: Sed Rate: 128 mm/hr — ABNORMAL HIGH (ref 0–20)

## 2016-05-09 LAB — PREPARE RBC (CROSSMATCH)

## 2016-05-09 LAB — ABO/RH: ABO/RH(D): O POS

## 2016-05-09 MED ORDER — SODIUM CHLORIDE 0.9 % IV SOLN: Freq: Once | INTRAVENOUS | Status: DC

## 2016-05-09 MED ORDER — MAGNESIUM HYDROXIDE 400 MG/5ML PO SUSP: 30.0000 mL | Freq: Every day | ORAL | Status: DC | PRN

## 2016-05-09 NOTE — Progress Notes (Signed)
1 Day Post-Op  Subjective: Patient seen. States the pain in his right foot is better than it was before surgery. Overall states he is doing fairly well.  Objective: Vital signs in last 24 hours: Temp:  [98.5 F (36.9 C)-100.5 F (38.1 C)] 99.9 F (37.7 C) (01/31 1248) Pulse Rate:  [79-95] 81 (01/31 1248) Resp:  [16-36] 18 (01/31 1248) BP: (112-134)/(50-69) 134/59 (01/31 1248) SpO2:  [96 %-100 %] 100 % (01/31 1248) Last BM Date: 05/02/16  Intake/Output from previous day: 01/30 0701 - 01/31 0700 In: 1770 [P.O.:720; I.V.:800; IV Piggyback:250] Out: 700 [Urine:650; Blood:50] Intake/Output this shift: Total I/O In: 557.5 [P.O.:240; Blood:317.5] Out: -   Moderate bleeding is noted on the bandaging. No significant purulence is noted. Edema significantly reduced upon removal of the bandage. The wound is covered with Mepitel and place an antibiotic beads within the wound site. Overall appears stable with no focal areas of necrosis. Still significant elevation of his white count with left shift.  Lab Results:   Recent Labs  05/08/16 0638 05/09/16 0509  WBC 18.3* 18.0*  HGB 7.6* 6.3*  HCT 22.6* 17.8*  PLT 460* 442*   BMET  Recent Labs  05/07/16 0423 05/09/16 0509  NA 132* 134*  K 3.9 4.0  CL 104 107  CO2 22 21*  GLUCOSE 154* 143*  BUN 20 20  CREATININE 1.31* 1.38*  CALCIUM 8.0* 7.7*   PT/INR No results for input(s): LABPROT, INR in the last 72 hours. ABG No results for input(s): PHART, HCO3 in the last 72 hours.  Invalid input(s): PCO2, PO2  Studies/Results: Mr Foot Right Wo Contrast  Result Date: 05/08/2016 CLINICAL DATA:  Open wound involving the base of the fifth metatarsal. Diabetic. EXAM: MRI OF THE RIGHT FOREFOOT WITHOUT CONTRAST TECHNIQUE: Multiplanar, multisequence MR imaging of the right foot was performed. No intravenous contrast was administered. COMPARISON:  Radiographs 05/03/2016 FINDINGS: Diffuse subcutaneous soft tissue swelling/edema/ fluid  consistent with cellulitis. There is diffuse edema like signal abnormality in the foot musculature consistent with myofasciitis. Large open wound is noted on the lateral aspect of the foot in the region of the base of the fifth metatarsal. There is some underlying fluid. There is also fluid tracking along the plantar aspect of the fourth metatarsal which could be an abscess. Difficult to be certain without contrast. T2 and STIR signal abnormality in the base of the fifth metatarsal and also along the lateral margin of the cuboid suspicious for osteomyelitis. No definite MR findings for septic arthritis. IMPRESSION: 1. Large open wound along the lateral aspect of the midfoot in the region of the base of the fifth metatarsal. This appears to extend right down to the bone. 2. Diffuse cellulitis and myofasciitis. 3. Fluid collection tracking from the area of the open wound along the plantar aspect of the fourth metatarsal is worrisome for an abscess. 4. Findings suspicious for osteomyelitis involving the base of the fifth metatarsal and the lateral aspect of the cuboid. Electronically Signed   By: Rudie MeyerP.  Gallerani M.D.   On: 05/08/2016 11:10    Anti-infectives: Anti-infectives    Start     Dose/Rate Route Frequency Ordered Stop   05/08/16 1800  piperacillin-tazobactam (ZOSYN) IVPB 3.375 g     3.375 g 12.5 mL/hr over 240 Minutes Intravenous Every 8 hours 05/08/16 1633     05/08/16 1404  vancomycin (VANCOCIN) powder  Status:  Discontinued       As needed 05/08/16 1404 05/08/16 1437   05/08/16 1300  ceFAZolin (  ANCEF) 1,000 mg in dextrose 5 % 100 mL IVPB  Status:  Discontinued     1,000 mg 220 mL/hr over 30 Minutes Intravenous  Once 05/08/16 1257 05/08/16 1259   05/08/16 1300  ceFAZolin (ANCEF) IVPB 1 g/50 mL premix     1 g 100 mL/hr over 30 Minutes Intravenous  Once 05/08/16 1259 05/08/16 1315   05/07/16 1515  amoxicillin-clavulanate (AUGMENTIN) 875-125 MG per tablet 1 tablet  Status:  Discontinued     1  tablet Oral Every 12 hours 05/07/16 1511 05/08/16 1747   05/07/16 0400  vancomycin (VANCOCIN) IVPB 750 mg/150 ml premix  Status:  Discontinued     750 mg 150 mL/hr over 60 Minutes Intravenous Every 18 hours 05/06/16 0409 05/06/16 0415   05/06/16 1800  ciprofloxacin (CIPRO) IVPB 400 mg  Status:  Discontinued     400 mg 200 mL/hr over 60 Minutes Intravenous Every 12 hours 05/06/16 1655 05/08/16 1618   05/06/16 1715  ceFAZolin (ANCEF) IVPB 1 g/50 mL premix    Comments:  Send with pt to OR   1 g 100 mL/hr over 30 Minutes Intravenous  Once 05/06/16 1714 05/07/16 1010   05/06/16 1600  vancomycin (VANCOCIN) IVPB 750 mg/150 ml premix  Status:  Discontinued     750 mg 150 mL/hr over 60 Minutes Intravenous Every 12 hours 05/06/16 0415 05/06/16 1654   05/04/16 1600  vancomycin (VANCOCIN) IVPB 750 mg/150 ml premix  Status:  Discontinued     750 mg 150 mL/hr over 60 Minutes Intravenous Every 12 hours 05/04/16 1518 05/06/16 0409   05/03/16 2200  ceFAZolin (ANCEF) IVPB 1 g/50 mL premix  Status:  Discontinued     1 g 100 mL/hr over 30 Minutes Intravenous Every 8 hours 05/03/16 1911 05/03/16 1914   05/03/16 2200  piperacillin-tazobactam (ZOSYN) IVPB 3.375 g  Status:  Discontinued     3.375 g 100 mL/hr over 30 Minutes Intravenous Every 8 hours 05/03/16 1914 05/03/16 2142   05/03/16 2200  piperacillin-tazobactam (ZOSYN) IVPB 3.375 g  Status:  Discontinued     3.375 g 12.5 mL/hr over 240 Minutes Intravenous Every 8 hours 05/03/16 2142 05/06/16 1601   05/03/16 1800  vancomycin (VANCOCIN) IVPB 1000 mg/200 mL premix  Status:  Discontinued     1,000 mg 200 mL/hr over 60 Minutes Intravenous  Once 05/03/16 1753 05/03/16 1913      Assessment/Plan: s/p Procedure(s): IRRIGATION AND DEBRIDEMENT FOOT WITH PLACEMENT OF ANTIBIOTIC BEADS (Right) Assessment: Stable status post I&D soft tissue and bone right foot   Plan: Dry sterile dressing reapplied to the right lower extremity followed by an Ace wrap. Continue  to monitor the wound over the next several days. Discussed with the patient that he may need a follow-up debridement if significant sign of infection. Also discussed that he is still at a significant risk for amputation of his right lower extremity due to the amount of bone and tissue loss as well as infection in the foot. Dr. Ether Griffins will be following the patient through the weekend  LOS: 6 days    Ricci Barker 05/09/2016

## 2016-05-09 NOTE — Clinical Social Work Note (Signed)
Clinical Social Work Assessment  Patient Details  Name: Damon [name] MRN: [id number] Date of Birth: 11-14-1950  Date of referral:  05/09/16               Reason for consult:  Facility Placement                Permission sought to share information with:  Chartered certified accountant granted to share information::  Yes, Verbal Permission Granted  Name::      [place]::   [place]   Relationship::     Contact Information:     Housing/Transportation Living arrangements for the past 2 months:  [place] of Information:  Patient, Spouse Patient Interpreter Needed:  None Criminal Activity/Legal Involvement Pertinent to Current Situation/Hospitalization:  No - Comment as needed Significant Relationships:  Spouse Lives with:  Spouse Do you feel safe going back to the place where you live?  Yes Need for family participation in patient care:  Yes (Comment)  Care giving concerns:  Patient lives in [place] with his wife Barnett Applebaum.   Social Worker assessment / plan:  Holiday representative (CSW) received verbal consult from MD that patient will need 4-6 weeks of IV Zosyn and may need SNF. PT is pending. CSW met with patient and his wife Barnett Applebaum was at bedside. CSW introduced self and explained role of CSW department. Patient reported that he works full time and lives at home with his wife. CSW explained IV Abx and SNF VS. Home health. Patient reported that he would consider SNF but prefers to go home. Wife reported that the decision will be up to him. Patient is agreeable to SNF search in [facility] FL2 complete and faxed out. CSW will continue to follow and assist as needed.   Employment status:  [employer] information:  Managed Care PT Recommendations:  Not assessed at this time Information / Referral to community resources:  [place]  Patient/Family's Response to care:  Patient will consider SNF.    Patient/Family's Understanding of and Emotional Response to Diagnosis, Current Treatment, and Prognosis:  Patient and wife were very pleasant and thanked CSW for assistance.   Emotional Assessment Appearance:  Appears stated age Attitude/Demeanor/Rapport:    Affect (typically observed):  Accepting, Adaptable, Pleasant Orientation:  Oriented to Self, Oriented to Place, Oriented to  Time, Oriented to Situation Alcohol / Substance use:  Not Applicable Psych involvement (Current and /or in the community):  No (Comment)  Discharge Needs  Concerns to be addressed:  Discharge Planning Concerns Readmission within the last 30 days:  No Current discharge risk:  Dependent with Mobility Barriers to Discharge:  Continued Medical Work up   [employer], Veronia Beets, LCSW 05/09/2016, 4:58 PM

## 2016-05-09 NOTE — Progress Notes (Signed)
[facility] PROGRESS NOTE Date of Admission:  05/03/2016     ID: MIKEAL WINSTANLEY is a 66 y.o. male with foot infection and PAD Active Problems:   Right foot ulcer (HCC)   Subjective: Low grade fevers, foot stable, wbc still elevated.   ROS  Eleven systems are reviewed and negative except per hpi  Medications:  Antibiotics Given (last 72 hours)    Date/Time Action Medication Dose Rate   05/06/16 1843 Given   ciprofloxacin (CIPRO) IVPB 400 mg 400 mg 200 mL/hr   05/07/16 0744 Given   ciprofloxacin (CIPRO) IVPB 400 mg 400 mg 200 mL/hr   05/07/16 0940 Given   ceFAZolin (ANCEF) IVPB 1 g/50 mL premix 1 g 100 mL/hr   05/07/16 1526 Given   amoxicillin-clavulanate (AUGMENTIN) 875-125 MG per tablet 1 tablet 1 tablet    05/07/16 1749 Given   ciprofloxacin (CIPRO) IVPB 400 mg 400 mg 200 mL/hr   05/07/16 2125 Given   amoxicillin-clavulanate (AUGMENTIN) 875-125 MG per tablet 1 tablet 1 tablet    05/08/16 0907 Given   ciprofloxacin (CIPRO) IVPB 400 mg 400 mg 200 mL/hr   05/08/16 1315 Given   ceFAZolin (ANCEF) IVPB 1 g/50 mL premix 1 g    05/08/16 1404 Given  [mixed with stimulan to make antibiotic beads to go to right foot.]   vancomycin (VANCOCIN) powder 1,000 mg    05/08/16 1721 Given   piperacillin-tazobactam (ZOSYN) IVPB 3.375 g 3.375 g 12.5 mL/hr   05/09/16 0505 Given   piperacillin-tazobactam (ZOSYN) IVPB 3.375 g 3.375 g 12.5 mL/hr   05/09/16 1313 Given   piperacillin-tazobactam (ZOSYN) IVPB 3.375 g 3.375 g 12.5 mL/hr     . sodium chloride   Intravenous Once  . ARIPiprazole  15 mg Oral Daily  . aspirin EC  81 mg Oral Daily  . atorvastatin  80 mg Oral q1800  . carvedilol  6.25 mg Oral BID WC  . enoxaparin (LOVENOX) injection  40 mg Subcutaneous Q24H  . gabapentin  300 mg Oral Daily  .  HYDROmorphone (DILAUDID) injection  1 mg Intravenous Once  . insulin aspart  0-9 Units Subcutaneous TID WC  . insulin glargine  10 Units Subcutaneous QHS  .  piperacillin-tazobactam (ZOSYN)  IV  3.375 g Intravenous Q8H  . senna  1 tablet Oral BID  . ticagrelor  90 mg Oral BID    Objective: Vital signs in last 24 hours: Temp:  [98.5 F (36.9 C)-100.5 F (38.1 C)] 99.9 F (37.7 C) (01/31 1248) Pulse Rate:  [79-95] 81 (01/31 1248) Resp:  [16-36] 18 (01/31 1248) BP: (112-134)/(50-69) 134/59 (01/31 1248) SpO2:  [96 %-100 %] 100 % (01/31 1248) Constitutional: He is oriented to person, place, and time. Chronically ill appearing HENT: anicteric Mouth/Throat: Oropharynx is clear and moist. No oropharyngeal exudate. Poor dentition Cardiovascular: Normal rate, regular rhythm and normal heart sounds. Pulmonary/Chest: Effort normal and breath sounds normal. No respiratory distress. He has no wheezes.  Abdominal: Soft. Bowel sounds are normal. He exhibits no distension. There is no tenderness.  Lymphadenopathy:  He has no cervical adenopathy. Neurological: He is alert and oriented to person, place, and time.  Skin: R foot wrapped post op, skin above wrapping is dark and warm and swollen PICC RUE Psychiatric: He has a normal mood and affect. His behavior is normal.   Lab Results  Recent Labs  05/07/16 0423 05/08/16 0638 05/09/16 0509  WBC 16.5* 18.3* 18.0*  HGB 7.9* 7.6* 6.3*  HCT 22.8* 22.6* 17.8*  NA 132*  --  134*  K 3.9  --  4.0  CL 104  --  107  CO2 22  --  21*  BUN 20  --  20  CREATININE 1.31*  --  1.38*    Microbiology: Results for orders placed or performed during the hospital encounter of 05/03/16  Wound or Superficial Culture     Status: None   Collection Time: 05/03/16  4:31 PM  Result Value Ref Range Status   Specimen Description WOUND  Final   Special Requests NONE  Final   Gram Stain   Final    RARE WBC PRESENT, PREDOMINANTLY MONONUCLEAR FEW GRAM POSITIVE COCCI IN PAIRS RARE GRAM VARIABLE ROD    Culture   Final    MODERATE ENTEROBACTER CLOACAE FEW GROUP B STREP(S.AGALACTIAE)ISOLATED TESTING AGAINST S. AGALACTIAE  NOT ROUTINELY PERFORMED DUE TO PREDICTABILITY OF AMP/PEN/VAN SUSCEPTIBILITY. Performed at [facility], [place] [address], [place], [place] [address]    Report Status 05/06/2016 FINAL  Final   Organism ID, Bacteria ENTEROBACTER CLOACAE  Final      Susceptibility   Enterobacter cloacae - MIC*    CEFAZOLIN >=64 RESISTANT Resistant     CEFEPIME <=1 SENSITIVE Sensitive     CEFTAZIDIME <=1 SENSITIVE Sensitive     CEFTRIAXONE <=1 SENSITIVE Sensitive     CIPROFLOXACIN <=0.25 SENSITIVE Sensitive     GENTAMICIN <=1 SENSITIVE Sensitive     IMIPENEM <=0.25 SENSITIVE Sensitive     TRIMETH/SULFA <=20 SENSITIVE Sensitive     PIP/TAZO <=4 SENSITIVE Sensitive     * MODERATE ENTEROBACTER CLOACAE  Blood culture (routine x 2)     Status: None   Collection Time: 05/03/16  4:31 PM  Result Value Ref Range Status   Specimen Description BLOOD RIGHT HAND  Final   Special Requests   Final    BOTTLES DRAWN AEROBIC AND ANAEROBIC AER 6ML ANA 5ML   Culture NO GROWTH 5 DAYS  Final   Report Status 05/08/2016 FINAL  Final  Blood culture (routine x 2)     Status: None   Collection Time: 05/03/16  4:31 PM  Result Value Ref Range Status   Specimen Description BLOOD RIGHT FOREARM  Final   Special Requests   Final    BOTTLES DRAWN AEROBIC AND ANAEROBIC AER 15ML ANA 14ML   Culture NO GROWTH 5 DAYS  Final   Report Status 05/08/2016 FINAL  Final  Aerobic/Anaerobic Culture (surgical/deep wound)     Status: None (Preliminary result)   Collection Time: 05/08/16  1:41 PM  Result Value Ref Range Status   Specimen Description AMP  Final   Special Requests NONE  Final   Gram Stain   Final    RARE WBC PRESENT, PREDOMINANTLY MONONUCLEAR RARE GRAM POSITIVE COCCI    Culture   Final    CULTURE REINCUBATED FOR BETTER GROWTH Performed at [facility], [address] [place], [place] [address]    Report Status PENDING  Incomplete   Lab Results  Component Value Date   ESRSEDRATE 128 (H) 05/09/2016   No results  found for: CRP  Studies/Results: Mr Foot Right Wo Contrast  Result Date: 05/08/2016 CLINICAL DATA:  Open wound involving the base of the fifth metatarsal. Diabetic. EXAM: MRI OF THE RIGHT FOREFOOT WITHOUT CONTRAST TECHNIQUE: Multiplanar, multisequence MR imaging of the right foot was performed. No intravenous contrast was administered. COMPARISON:  Radiographs 05/03/2016 FINDINGS: Diffuse subcutaneous soft tissue swelling/edema/ fluid consistent with cellulitis. There is diffuse edema  like signal abnormality in the foot musculature consistent with myofasciitis. Large open wound is noted on the lateral aspect of the foot in the region of the base of the fifth metatarsal. There is some underlying fluid. There is also fluid tracking along the plantar aspect of the fourth metatarsal which could be an abscess. Difficult to be certain without contrast. T2 and STIR signal abnormality in the base of the fifth metatarsal and also along the lateral margin of the cuboid suspicious for osteomyelitis. No definite MR findings for septic arthritis. IMPRESSION: 1. Large open wound along the lateral aspect of the midfoot in the region of the base of the fifth metatarsal. This appears to extend right down to the bone. 2. Diffuse cellulitis and myofasciitis. 3. Fluid collection tracking from the area of the open wound along the plantar aspect of the fourth metatarsal is worrisome for an abscess. 4. Findings suspicious for osteomyelitis involving the base of the fifth metatarsal and the lateral aspect of the cuboid. Electronically Signed   By: Marijo Sanes M.D.   On: 05/08/2016 11:10    Assessment/Plan: CARLISLE ENKE is a 66 y.o. male with DM, PVD, ongoing smoking admitted with R foot infection and MRI evidence of osteomyelitis and abscess. Cx mixed with enterobacter and Grp B strep. Surgical cx pending.  ESR 128, CRP p Will need PICC and several weeks of IV abx. Would treat broadly given foot infection and gangrene and not  tailor to just the strep and enterococcus. No MRSA found yet.  PICC placed  Recommendations Cont  Zosyn  No need for Vanco unless MRSA grows on cx -see IV abx order sheet Thank you very much for the consult. Will follow with you.  Treavor Blomquist P   05/09/2016, 2:13 PM

## 2016-05-09 NOTE — Clinical Social Work Placement (Signed)
   CLINICAL SOCIAL WORK PLACEMENT  NOTE  Date:  05/09/2016  Patient Details  Name: Damon [name] MRN: [id number] Date of Birth: 04/05/1951  Clinical Social Work is seeking post-discharge placement for this patient at the Skilled  Nursing Facility level of care (*CSW will initial, date and re-position this form in  chart as items are completed):  Yes   Patient/family provided with [facility] Clinical Social Work Department's list of facilities offering this level of care within the geographic area requested by the patient (or if unable, by the patient's family).  Yes   Patient/family informed of their freedom to choose among providers that offer the needed level of care, that participate in Medicare, Medicaid or managed care program needed by the patient, have an available bed and are willing to accept the patient.  Yes   Patient/family informed of 's ownership interest in [facility] and [facility], as well as of the fact that they are under no obligation to receive care at these facilities.  PASRR submitted to EDS on 05/09/16     PASRR number received on 05/09/16     Existing PASRR number confirmed on       FL2 transmitted to all facilities in geographic area requested by pt/family on 05/09/16     FL2 transmitted to all facilities within larger geographic area on       Patient informed that his/her managed care company has contracts with or will negotiate with certain facilities, including the following:            Patient/family informed of bed offers received.  Patient chooses bed at       Physician recommends and patient chooses bed at      Patient to be transferred to   on  .  Patient to be transferred to facility by       Patient family notified on   of transfer.  Name of family member notified:        PHYSICIAN       Additional Comment:    _______________________________________________ Bhavana Kady, Darleen CrockerBailey M, LCSW 05/09/2016, 4:57 PM

## 2016-05-09 NOTE — Progress Notes (Addendum)
[facility] [place] [facility]   PATIENT NAME: Damon [name]    MR#:  [id number]  DATE OF BIRTH:  06-09-1950  SUBJECTIVE:  CHIEF COMPLAINT:   Chief Complaint  Patient presents with  . Shortness of Breath     Have vascular problems, s/p previous procedures. Came with ulcers and infection. No complaint. He had BM yesterday, no melena or bloody stool. Not much blood loss during right foot surgery yesterday per Damon [name]  REVIEW OF SYSTEMS:  CONSTITUTIONAL: No fever, fatigue or weakness.  EYES: No blurred or double vision.  EARS, NOSE, AND THROAT: No tinnitus or ear pain.  RESPIRATORY: No cough, shortness of breath, wheezing or hemoptysis.  CARDIOVASCULAR: No chest pain, orthopnea, edema.  GASTROINTESTINAL: No nausea, vomiting, diarrhea or abdominal pain.  GENITOURINARY: No dysuria, hematuria.  ENDOCRINE: No polyuria, nocturia,  HEMATOLOGY: No anemia, easy bruising or bleeding SKIN: ulcer on right foot, rash or lesion. MUSCULOSKELETAL: No joint pain or arthritis.   NEUROLOGIC: No tingling, numbness, weakness.  PSYCHIATRY: No anxiety or depression.   ROS  DRUG ALLERGIES:  No Known Allergies  VITALS:  Blood pressure (!) 134/59, pulse 81, temperature 99.9 F (37.7 C), temperature source Oral, resp. rate 18, height 5\' 10"  (1.778 m), weight 160 lb (72.6 kg), SpO2 100 %.  PHYSICAL EXAMINATION:  GENERAL:  66 y.o.-year-old patient lying in the bed with no acute distress.  EYES: Pupils equal, round, reactive to light and accommodation. No scleral icterus. Extraocular muscles intact.  HEENT: Head atraumatic, normocephalic. Oropharynx and nasopharynx clear.  NECK:  Supple, no jugular venous distention. No thyroid enlargement, no tenderness.  LUNGS: Normal breath sounds bilaterally, no wheezing, rales,rhonchi or crepitation. No use of accessory muscles of respiration.  CARDIOVASCULAR: S1, S2 normal. No murmurs, rubs, or gallops.  ABDOMEN: Soft, nontender,  nondistended. Bowel sounds present. No organomegaly or mass.  EXTREMITIES: No pedal edema, cyanosis, or clubbing. right foot in dressing. NEUROLOGIC: Cranial nerves II through XII are intact. Muscle strength 4/5 in all extremities. Sensation intact. Gait not checked.  PSYCHIATRIC: The patient is alert and oriented x 3.  SKIN: No obvious rash, lesion, or ulcer.    Physical Exam LABORATORY PANEL:   CBC  Recent Labs Lab 05/09/16 0509  WBC 18.0*  HGB 6.3*  HCT 17.8*  PLT 442*   ------------------------------------------------------------------------------------------------------------------  Chemistries   Recent Labs Lab 05/03/16 1631  05/09/16 0509  NA 134*  < > 134*  K 4.3  < > 4.0  CL 102  < > 107  CO2 22  < > 21*  GLUCOSE 52*  < > 143*  BUN 21*  < > 20  CREATININE 1.24  < > 1.38*  CALCIUM 8.6*  < > 7.7*  AST 35  --   --   ALT 47  --   --   ALKPHOS 81  --   --   BILITOT 0.8  --   --   < > = values in this interval not displayed. ------------------------------------------------------------------------------------------------------------------  Cardiac Enzymes  Recent Labs Lab 05/03/16 1631  TROPONINI 0.06*   ------------------------------------------------------------------------------------------------------------------  RADIOLOGY:  Mr Foot Right Wo Contrast  Result Date: 05/08/2016 CLINICAL DATA:  Open wound involving the base of the fifth metatarsal. Diabetic. EXAM: MRI OF THE RIGHT FOREFOOT WITHOUT CONTRAST TECHNIQUE: Multiplanar, multisequence MR imaging of the right foot was performed. No intravenous contrast was administered. COMPARISON:  Radiographs 05/03/2016 FINDINGS: Diffuse subcutaneous soft tissue swelling/edema/ fluid consistent with cellulitis. There is diffuse edema like signal abnormality  in the foot musculature consistent with myofasciitis. Large open wound is noted on the lateral aspect of the foot in the region of the base of the fifth  metatarsal. There is some underlying fluid. There is also fluid tracking along the plantar aspect of the fourth metatarsal which could be an abscess. Difficult to be certain without contrast. T2 and STIR signal abnormality in the base of the fifth metatarsal and also along the lateral margin of the cuboid suspicious for osteomyelitis. No definite MR findings for septic arthritis. IMPRESSION: 1. Large open wound along the lateral aspect of the midfoot in the region of the base of the fifth metatarsal. This appears to extend right down to the bone. 2. Diffuse cellulitis and myofasciitis. 3. Fluid collection tracking from the area of the open wound along the plantar aspect of the fourth metatarsal is worrisome for an abscess. 4. Findings suspicious for osteomyelitis involving the base of the fifth metatarsal and the lateral aspect of the cuboid. Electronically Signed   By: Rudie Meyer M.D.   On: 05/08/2016 11:10    ASSESSMENT AND PLAN:   Active Problems:   Right foot ulcer (HCC)   Damon [name]  is a 66 y.o. male with a known history of Peripheral vascular disease status post stenting and PTCA in July 2017 right common iliac left common iliac and posterior tibial artery, ongoing tobacco abuse, hypertension, history of CKD stage III and COPD comes to the emergency room with fever, generalized weakness and foul-smelling ulcer on the right foot which appears dry and has darkened skin around.   1. Right foot nonhealing dry gangrene/ulcer with foul-smelling, osteomyelitis. -He was started recently on Keflex, changed to IV Zosyn negative blood cultures so far.  Wound culture showed MODERATE ENTEROBACTER CLOACAE and FEW GROUP B STREP(S.AGALACTIAE) MRI evidence of osteomyelitis and abscess. S/p Excisional debridement infected soft tissue and bone right lateral foot today (05/08/2016). Per Dr. Alberteen Spindle,  may need a follow-up debridement if significant sign of infection. He is still at a significant risk for  amputation of his right lower extremity due to the amount of bone and tissue loss as well as infection in the foot.  Still leukocytosis, f/u CBC and wound culture. Zosyn every 8 hours for 4-6 weeks. PICC line per Dr. Sampson Goon.  PAD.  He got angiogram and Percutaneous transluminal angioplasty of right lower extremity on 05/07/2016. -Continue Brilinta and aspirin  2. History of COPD. Stable. -Continue oral inhalers and nebs  3. Hypertension continue home meds  4. Tobacco abuse discussed smoking cessation more than 4 minutes spent.  5. Diabetes   Had hypoglycemia, required Inj D50 05/03/16- so stopped lantus. On Sliding scale. BS is elevated, increase Lantus to 10 units at bedtime.  6. CKD stage II. Stable.  Anemia, possible due to chronic disease and acute blood loss.  Hb down to 6.3 today. PRBC transfusion. Follow-up Hb and stool occult. Anemia workup. GI consult. Hold lovenox for DVT prophylaxis.  Hyponatremia. Started normal saline IV. Follow up BMP.  PT.  Discussed with Dr. Sampson Goon and Dr. Alberteen Spindle.  All the records are reviewed and case discussed with Care Management/Social Workerr. Management plans discussed with the patient, his wife and they are in agreement.  CODE STATUS: Full.  TOTAL TIME TAKING CARE OF THIS PATIENT: 41 minutes.     POSSIBLE D/C IN 2-3 DAYS, DEPENDING ON CLINICAL CONDITION.   Shaune Pollack M.D on 05/09/2016   Between 7am to 6pm - Pager - [phone]  After 6pm go  to [email or web address] - password [email or web address]  [facility]  Office  [phone]  CC: Primary care physician; Emogene Morgan, MD  Note: This dictation was prepared with Dragon dictation along with smaller phrase technology. Any transcriptional errors that result from this process are unintentional.

## 2016-05-09 NOTE — Care Management Note (Signed)
Case Management Note  Patient Details  Name: Damon [name] MRN: [id number] Date of Birth: [birth year]  Subjective/Objective:   [facility] with [facility]. They will assume IV antibiotics for patient. Spoke with wife, Vena Austrialeanor, who is agreeable to POC. Requests a wheelchair for patient.                Patient suffers from osteomyelitis which impairs his ability to perform daily  (Diagnosis)  activities like walking in the home. A walker will not resolve the   issue with performing activities of daily living. A wheelchair will allow patient to safely perform daily activities.  Patient can self propel in the lightweight wheelchair.    Action/Plan:   Expected Discharge Date:                  Expected Discharge Plan:  Home w Home Health Services  In-House Referral:     Discharge planning Services  CM Consult  Post Acute Care Choice:  Resumption of Svcs/PTA Provider Choice offered to:     DME Arranged:  IV pump/equipment, Wheelchair manual DME Agency:  [facility]  HH [facility]  [facility]  [facility]  Status of Service:     If discussed at Long Length of Stay Meetings, dates discussed:    Additional Comments:  Marily MemosLisa M Shondale Quinley, RN 05/09/2016, 2:56 PM

## 2016-05-09 NOTE — Progress Notes (Signed)
Spoke with MD Joycelyn RuaMichael Diamond about hemoglobin at 6.3.

## 2016-05-09 NOTE — [place] FL2 (Signed)
[place] MEDICAID FL2 LEVEL OF CARE SCREENING TOOL     IDENTIFICATION  Patient Name: Damon [name] Birthdate: Mar 19, 1951 Sex: male Admission Date (Current Location): 05/03/2016  [place]   Facility and Address:  [facility], [address], [place], [place] [address]      Provider Number: [id number]  Attending Physician Name and Address:  Shaune PollackQing Chen, MD  Relative Name and Phone Number:       Current Level of Care: Hospital Recommended Level of Care: Skilled Nursing Facility Prior Approval Number:    Date Approved/Denied:   PASRR Number:  [id number])  Discharge Plan: SNF    Current Diagnoses: Patient Active Problem List   Diagnosis Date Noted  . Right foot ulcer (HCC) 05/03/2016  . Diabetes mellitus type 2 with complications (HCC) 05/02/2016  . 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 Protein-calorie malnutrition, severe 03/28/2016  . Acute renal failure (ARF) (HCC) 03/27/2016  . Hypotension 03/27/2016  . SOB (shortness of breath) 02/15/2016  . CKD (chronic kidney disease), stage II 02/15/2016  . CAD (coronary artery disease) 01/17/2016  . Essential hypertension 01/17/2016  . HLD (hyperlipidemia) 01/17/2016  . ST elevation myocardial infarction involving left anterior descending (LAD) coronary artery (HCC)   . Cardiomyopathy, ischemic   . Acute respiratory failure (HCC) 01/13/2016  . NSTEMI (non-ST elevated myocardial infarction) (HCC)     Orientation RESPIRATION BLADDER Height & Weight     Self, Time, Situation, Place  Normal Continent Weight: 160 lb (72.6 kg) Height:  5\' 10"  (177.8 cm)  BEHAVIORAL SYMPTOMS/MOOD NEUROLOGICAL BOWEL NUTRITION STATUS   (none)  (none) Continent Diet (Diet: Carb Modified )  AMBULATORY STATUS COMMUNICATION OF NEEDS Skin   Extensive Assist Verbally Surgical wounds (05/08/16 Foot Incision. )                        Personal Care Assistance Level of Assistance  Bathing, Feeding, Dressing Bathing Assistance: Limited assistance Feeding assistance: Independent Dressing Assistance: Limited assistance     Functional Limitations Info  Sight, Hearing, Speech Sight Info: Adequate Hearing Info: Adequate Speech Info: Adequate    SPECIAL CARE FACTORS FREQUENCY  PT (By licensed PT), OT (By licensed OT) (4-6 weeks of IV Zosyn. )     PT Frequency:  (5) OT Frequency:  (5)            Contractures      Additional Factors Info  Code Status, Allergies Code Status Info:  (Full Code. ) Allergies Info:  (No Known Allergies. )           Current Medications (05/09/2016):  This is the current hospital active medication list Current Facility-Administered Medications  Medication Dose Route Frequency Provider Last Rate Last Dose  . 0.9 %  sodium chloride infusion  500 mL Intravenous Once PRN Annice NeedyJason S Dew, MD      . 0.9 %  sodium chloride infusion   Intravenous Continuous Rosaria FerriesJoseph K Piscitello, MD 50 mL/hr at 05/08/16 1300    . 0.9 %  sodium chloride infusion   Intravenous Once Arnaldo NatalMichael S Diamond, MD      . acetaminophen (TYLENOL) tablet 325-650 mg  325-650 mg Oral Q4H PRN Annice NeedyJason S Dew, MD       Or  . acetaminophen (TYLENOL) suppository 325-650 mg  325-650 mg Rectal Q4H PRN Annice NeedyJason S Dew, MD      . acetaminophen (TYLENOL) tablet  650 mg  650 mg Oral Q6H PRN Enedina Finner, MD   650 mg at 05/07/16 2004   Or  . acetaminophen (TYLENOL) suppository 650 mg  650 mg Rectal Q6H PRN Enedina Finner, MD      . albuterol (PROVENTIL) (2.5 MG/3ML) 0.083% nebulizer solution 2.5 mg  2.5 mg Nebulization Q6H PRN Enedina Finner, MD      . ARIPiprazole (ABILIFY) tablet 15 mg  15 mg Oral Daily Enedina Finner, MD   15 mg at 05/09/16 0942  . aspirin EC tablet 81 mg  81 mg Oral Daily Enedina Finner, MD   81 mg at 05/09/16 0942  . atorvastatin (LIPITOR) tablet 80 mg  80 mg Oral q1800 Enedina Finner, MD   80 mg at 05/09/16 1615  . bisacodyl (DULCOLAX)  suppository 10 mg  10 mg Rectal Daily PRN Enedina Finner, MD      . carvedilol (COREG) tablet 6.25 mg  6.25 mg Oral BID WC Shaune Pollack, MD   6.25 mg at 05/09/16 1615  . gabapentin (NEURONTIN) capsule 300 mg  300 mg Oral Daily Enedina Finner, MD   300 mg at 05/09/16 0942  . guaiFENesin-dextromethorphan (ROBITUSSIN DM) 100-10 MG/5ML syrup 15 mL  15 mL Oral Q4H PRN Annice Needy, MD      . hydrALAZINE (APRESOLINE) injection 5 mg  5 mg Intravenous Q20 Min PRN Annice Needy, MD      . HYDROmorphone (DILAUDID) injection 0.5-1 mg  0.5-1 mg Intravenous Q2H PRN Annice Needy, MD      . HYDROmorphone (DILAUDID) injection 1 mg  1 mg Intravenous Once Kimberly A Stegmayer, PA-C      . insulin aspart (novoLOG) injection 0-9 Units  0-9 Units Subcutaneous TID WC Enedina Finner, MD   2 Units at 05/09/16 1615  . insulin glargine (LANTUS) injection 10 Units  10 Units Subcutaneous QHS Shaune Pollack, MD   10 Units at 05/08/16 2151  . ipratropium-albuterol (DUONEB) 0.5-2.5 (3) MG/3ML nebulizer solution 3 mL  3 mL Nebulization Q4H PRN Enedina Finner, MD      . labetalol (NORMODYNE,TRANDATE) injection 10 mg  10 mg Intravenous Q10 min PRN Annice Needy, MD      . lactulose (CHRONULAC) 10 GM/15ML solution 20 g  20 g Oral BID PRN Shaune Pollack, MD      . magnesium hydroxide (MILK OF MAGNESIA) suspension 30 mL  30 mL Oral Daily PRN Shaune Pollack, MD      . metoprolol (LOPRESSOR) injection 2-5 mg  2-5 mg Intravenous Q2H PRN Annice Needy, MD      . ondansetron [facility]) injection 4 mg  4 mg Intravenous Q6H PRN Annice Needy, MD   4 mg at 05/08/16 1328  . ondansetron (ZOFRAN) injection 4 mg  4 mg Intravenous Q6H PRN Kimberly A Stegmayer, PA-C      . oxyCODONE-acetaminophen (PERCOCET/ROXICET) 5-325 MG per tablet 1 tablet  1 tablet Oral Q6H PRN Enedina Finner, MD   1 tablet at 05/09/16 0413  . phenol (CHLORASEPTIC) mouth spray 1 spray  1 spray Mouth/Throat PRN Annice Needy, MD      . piperacillin-tazobactam (ZOSYN) IVPB 3.375 g  3.375 g Intravenous [address] [place], RPH    3.375 g at 05/09/16 1313  . polyethylene glycol (MIRALAX / GLYCOLAX) packet 17 g  17 g Oral Daily PRN Enedina Finner, MD      . senna (SENOKOT) tablet 8.6 mg  1 tablet Oral BID Enedina Finner, MD   8.6  mg at 05/09/16 0941  . ticagrelor (BRILINTA) tablet 90 mg  90 mg Oral BID Enedina Finner, MD   90 mg at 05/09/16 0941  . traMADol (ULTRAM) tablet 50 mg  50 mg Oral Q6H PRN Enedina Finner, MD   50 mg at 05/05/16 1718     Discharge Medications: Please see discharge summary for a list of discharge medications.  Relevant Imaging Results:  Relevant Lab Results:   Additional Information  (SSN: [id number])  Moody Robben, Darleen Crocker, LCSW

## 2016-05-10 LAB — GLUCOSE, CAPILLARY
GLUCOSE-CAPILLARY: 82 mg/dL (ref 65–99)
GLUCOSE-CAPILLARY: 103 mg/dL — AB (ref 65–99)
Glucose-Capillary: 141 mg/dL — ABNORMAL HIGH (ref 65–99)
Glucose-Capillary: 146 mg/dL — ABNORMAL HIGH (ref 65–99)

## 2016-05-10 LAB — TYPE AND SCREEN
Blood Product Expiration Date: 201802222359
Blood Product Expiration Date: 201802222359
ISSUE DATE / TIME: 201801311021
ISSUE DATE / TIME: 201801311252
Unit Type and Rh: 5100
Unit Type and Rh: 5100

## 2016-05-10 LAB — BASIC METABOLIC PANEL
ANION GAP: 6 (ref 5–15)
BUN: 20 mg/dL (ref 6–20)
CO2: 22 mmol/L (ref 22–32)
Calcium: 7.9 mg/dL — ABNORMAL LOW (ref 8.9–10.3)
Chloride: 108 mmol/L (ref 101–111)
Creatinine, Ser: 1.24 mg/dL (ref 0.61–1.24)
GFR calc Af Amer: >60 mL/min (ref >60)
GFR calc non Af Amer: 60 mL/min — ABNORMAL LOW (ref >60)
Glucose, Bld: 111 mg/dL — ABNORMAL HIGH (ref 65–99)
POTASSIUM: 3.8 mmol/L (ref 3.5–5.1)
Sodium: 136 mmol/L (ref 135–145)

## 2016-05-10 LAB — CBC
HEMATOCRIT: 23.5 % — AB (ref 40.0–52.0)
Hemoglobin: 8.1 g/dL — ABNORMAL LOW (ref 13.0–18.0)
MCH: 31 pg (ref 26.0–34.0)
MCHC: 34.6 g/dL (ref 32.0–36.0)
MCV: 89.4 fL (ref 80.0–100.0)
Platelets: 430 10*3/uL (ref 150–440)
RBC: 2.63 MIL/uL — AB (ref 4.40–5.90)
RDW: 15.8 % — ABNORMAL HIGH (ref 11.5–14.5)
WBC: 16.8 10*3/uL — AB (ref 3.8–10.6)

## 2016-05-10 LAB — IRON AND TIBC
Iron: 15 ug/dL — ABNORMAL LOW (ref 45–182)
SATURATION RATIOS: 14 % — AB (ref 17.9–39.5)
TIBC: 110 ug/dL — ABNORMAL LOW (ref 250–450)
UIBC: 95 ug/dL

## 2016-05-10 LAB — FERRITIN: FERRITIN: 1096 ng/mL — AB (ref 24–336)

## 2016-05-10 MED ORDER — FERROUS SULFATE 325 (65 FE) MG PO TABS
325.0000 mg | ORAL_TABLET | Freq: Three times a day (TID) | ORAL | Status: DC
  Administered 2016-05-10 – 2016-05-11 (×3): 325 mg via ORAL
  Filled 2016-05-10 (×3): qty 1

## 2016-05-10 NOTE — Progress Notes (Signed)
[facility] [place] [facility]   PATIENT NAME: Damon [name]    MR#:  [id number]  DATE OF BIRTH:  1950/09/28  SUBJECTIVE:  CHIEF COMPLAINT:   Chief Complaint  Patient presents with  . Shortness of Breath     Have vascular problems, s/p previous procedures. Came with ulcers and infection. No complaint. He had BM yesterday, no melena or bloody stool.  REVIEW OF SYSTEMS:  CONSTITUTIONAL: No fever, fatigue or weakness.  EYES: No blurred or double vision.  EARS, NOSE, AND THROAT: No tinnitus or ear pain.  RESPIRATORY: No cough, shortness of breath, wheezing or hemoptysis.  CARDIOVASCULAR: No chest pain, orthopnea, edema.  GASTROINTESTINAL: No nausea, vomiting, diarrhea or abdominal pain.  GENITOURINARY: No dysuria, hematuria.  ENDOCRINE: No polyuria, nocturia,  HEMATOLOGY: No anemia, easy bruising or bleeding SKIN: ulcer on right foot, rash or lesion. MUSCULOSKELETAL: No joint pain or arthritis.   NEUROLOGIC: No tingling, numbness, weakness.  PSYCHIATRY: No anxiety or depression.   ROS  DRUG ALLERGIES:  No Known Allergies  VITALS:  Blood pressure (!) 154/57, pulse 76, temperature 99.1 F (37.3 C), temperature source Oral, resp. rate 18, height 5\' 10"  (1.778 m), weight 160 lb (72.6 kg), SpO2 100 %.  PHYSICAL EXAMINATION:  GENERAL:  66 y.o.-year-old patient lying in the bed with no acute distress.  EYES: Pupils equal, round, reactive to light and accommodation. No scleral icterus. Extraocular muscles intact.  HEENT: Head atraumatic, normocephalic. Oropharynx and nasopharynx clear.  NECK:  Supple, no jugular venous distention. No thyroid enlargement, no tenderness.  LUNGS: Normal breath sounds bilaterally, no wheezing, rales,rhonchi or crepitation. No use of accessory muscles of respiration.  CARDIOVASCULAR: S1, S2 normal. No murmurs, rubs, or gallops.  ABDOMEN: Soft, nontender, nondistended. Bowel sounds present. No organomegaly or mass.  EXTREMITIES: No  pedal edema, cyanosis, or clubbing. right foot in dressing. NEUROLOGIC: Cranial nerves II through XII are intact. Muscle strength 4/5 in all extremities. Sensation intact. Gait not checked.  PSYCHIATRIC: The patient is alert and oriented x 3.  SKIN: No obvious rash, lesion, or ulcer.    Physical Exam LABORATORY PANEL:   CBC  Recent Labs Lab 05/10/16 0422  WBC 16.8*  HGB 8.1*  HCT 23.5*  PLT 430   ------------------------------------------------------------------------------------------------------------------  Chemistries   Recent Labs Lab 05/03/16 1631  05/10/16 0422  NA 134*  < > 136  K 4.3  < > 3.8  CL 102  < > 108  CO2 22  < > 22  GLUCOSE 52*  < > 111*  BUN 21*  < > 20  CREATININE 1.24  < > 1.24  CALCIUM 8.6*  < > 7.9*  AST 35  --   --   ALT 47  --   --   ALKPHOS 81  --   --   BILITOT 0.8  --   --   < > = values in this interval not displayed. ------------------------------------------------------------------------------------------------------------------  Cardiac Enzymes  Recent Labs Lab 05/03/16 1631  TROPONINI 0.06*   ------------------------------------------------------------------------------------------------------------------  RADIOLOGY:  No results found.  ASSESSMENT AND PLAN:   Active Problems:   Right foot ulcer (HCC)   Damon [name]  is a 66 y.o. male with a known history of Peripheral vascular disease status post stenting and PTCA in July 2017 right common iliac left common iliac and posterior tibial artery, ongoing tobacco abuse, hypertension, history of CKD stage III and COPD comes to the emergency room with fever, generalized weakness and foul-smelling ulcer on the right  foot which appears dry and has darkened skin around.   1. Right foot nonhealing dry gangrene/ulcer with foul-smelling, osteomyelitis. -He was started recently on Keflex, changed to IV Zosyn negative blood cultures so far.  Wound culture showed MODERATE ENTEROBACTER  CLOACAE and FEW GROUP B STREP(S.AGALACTIAE) MRI evidence of osteomyelitis and abscess. S/p Excisional debridement infected soft tissue and bone right lateral foot today (05/08/2016). Per Dr. Alberteen Spindleline,  may need a follow-up debridement if significant sign of infection. He is still at a significant risk for amputation of his right lower extremity due to the amount of bone and tissue loss as well as infection in the foot.  Still leukocytosis, f/u CBC and wound culture. Zosyn every 8 hours for 4-6 weeks. PICC line per Dr. Sampson GoonFitzgerald.  PAD.  He got angiogram and Percutaneous transluminal angioplasty of right lower extremity on 05/07/2016. -Continue Brilinta and aspirin  2. History of COPD. Stable. -Continue oral inhalers and nebs  3. Hypertension continue home meds  4. Tobacco abuse discussed smoking cessation more than 4 minutes spent.  5. Diabetes   Had hypoglycemia, required Inj D50 05/03/16- so stopped lantus. On Sliding scale. BS is elevated, increase Lantus to 10 units at bedtime.  6. CKD stage II. Stable.  Anemia, possible due to chronic disease and acute blood loss.  Hb down to 6.3, up to 8.1 after 2 units of PRBC transfusion. Follow-up Hb and stool occult. Anemia workup: low iron. F/u GI consult. Hold lovenox for DVT prophylaxis. Start feosol.  Hyponatremia. Improved with normal saline IV.Marland Kitchen.  PT: SNF  All the records are reviewed and case discussed with Care Management/Social Workerr. Management plans discussed with the patient, his wife and they are in agreement.  CODE STATUS: Full.  TOTAL TIME TAKING CARE OF THIS PATIENT: 37 minutes.     POSSIBLE D/C IN 2-3 DAYS, DEPENDING ON CLINICAL CONDITION.   [name], Lourie Retz M.D on 05/10/2016   Between 7am to 6pm - Pager - [phone]  After 6pm go to [email or web address] - password [email or web address]  [facility] [place] [facility]  Office  [phone]  CC: Primary care physician; Emogene MorganAYCOCK, [name], MD  Note: This dictation was prepared with  Dragon dictation along with smaller phrase technology. Any transcriptional errors that result from this process are unintentional.

## 2016-05-10 NOTE — Progress Notes (Signed)
Patient tried having BM twice today with no results.  Given lactulose since no BM documented since admission.

## 2016-05-10 NOTE — Progress Notes (Signed)
PT Cancellation Note  Patient Details Name: Damon [name] MRN: [id number] DOB: 1950/10/11   Cancelled Treatment:    Reason Eval/Treat Not Completed: Other (comment).  Pt s/p excisional debridement R lateral foot.  No indication found in chart review of WB status or need for post-op shoe.  Paged Dr. Alberteen Spindleline and will await response regarding WB status before initiating PT evaluation.     Encarnacion ChuAshley Abashian PT, DPT [phone] 05/10/2016, 9:58 AM

## 2016-05-10 NOTE — Progress Notes (Signed)
Social work Tax inspectorintern spoke with patient's wife Vena Austrialeanor on the phone. Social work Tax inspectorintern explained to patient's wife that patient is going to need an IV antibiotic for about 4-6 weeks and that is a rehabilitation need. Social work Tax inspectorintern explained that with patient's insurance of [employer], authorization would have to be started soon, as it can be a lengthy process. Patient's wife verbally agreed she understood. Social work Tax inspectorintern presented bed offers to patient's wife. [facility]'s wife chose [facility]. Jomarie LongsJoseph, [facility] Liaison will start authorization today. Social work Tax inspectorintern will continue to assist and follow as needed.   Ralene BatheMacKenzie Errik Mitchelle, Social Work Intern  [phone]

## 2016-05-10 NOTE — Progress Notes (Signed)
F/u right foot debridment. Wound covered with mepitel and packed with antibiotic beads. No purulence or foul odor today. Large wound plantar lateral foot.   Will continue to monitor.

## 2016-05-10 NOTE — Evaluation (Addendum)
Physical Therapy Evaluation Patient Details Name: Damon [name] MRN: [id number] DOB: 09/04/1950 Today's Date: 05/10/2016   History of Present Illness  Pt 66 yo male w/ R foot ulcer and s/p I&D of R foot. Hisotry of peripheral vascular disease, COPD and CKD  Clinical Impression  Pt alert, awake, responsive to commands, and willing to participate in PT eval. Pt demonstrated decreased LE strength, grossly at least 3/5. Wife reports that Pt requires mod assist for ADLs at baseline and was able to ambulate short distances w/ RW . During eval patient required mod assist to move to sitting at EOB and required min guard to mod assist to maintain sitting balance. Pt educated on Wbing precautions before attempting sit to stand transfers w/ Rw. Pt required max assist to transfer to standing and had difficulty achieving upright posture due to weakness and was returned to sitting for inability to maintain NWBing. He demonstrated good participation and effort throughout PT session. Exercises were limited due to patient's need to use bedside pan. Pt severely limited in safe functional mobility due to decreased strength, and Wbing precautions of the R LE. He will benefit from skilled PT to correct above deficits and recommend he dc STR following acute hospitalization.        Follow Up Recommendations SNF    Equipment Recommendations  Rolling walker with 5" wheels    Recommendations for Other Services OT consult     Precautions / Restrictions Precautions Precautions: Fall Restrictions Weight Bearing Restrictions: Yes RLE Weight Bearing: Non weight bearing      Mobility  Bed Mobility Overal bed mobility: Needs Assistance Bed Mobility: Supine to Sit     Supine to sit: Mod assist     General bed mobility comments: cuing for hand placement, assist for moving B LE and trunk   Transfers Overall transfer level: Needs assistance Equipment used: Rolling walker (2 wheeled) Transfers: Sit to/from  Stand Sit to Stand: Max assist;+2 physical assistance         General transfer comment: Difficulty keepin L LE from moving anteriorly and maintaining WBing precautions on R LE, forward flexed trunk   Ambulation/Gait             General Gait Details: did not assess max assist x2 for standing   Stairs            Wheelchair Mobility    Modified Rankin (Stroke Patients Only)       Balance Overall balance assessment: Needs assistance Sitting-balance support: Bilateral upper extremity supported Sitting balance-Leahy Scale: Poor Sitting balance - Comments: poor sitting endurance, increased effort to maitain sitting posture, min guard to mod assist to prevent LOB Postural control: Posterior lean Standing balance support: Bilateral upper extremity supported Standing balance-Leahy Scale: Poor Standing balance comment: Max assist x2 to stand, forward flexed posture, unable to maintain WBing precautions                             Pertinent Vitals/Pain Pain Assessment: 0-10 Pain Score: 5  Pain Location: R foot Pain Descriptors / Indicators: Aching Pain Intervention(s): Monitored during session;Repositioned    Home Living Family/patient expects to be discharged to:: Private residence Living Arrangements: Spouse/significant other Available Help at Discharge: Family Type of Home: House Home Access: Stairs to enter   [employer] of Steps: 6 in front 2 in back Home Layout: One level Home Equipment: Walker - 2 wheels      Prior Function Level  of Independence: Needs assistance         Comments: Mod indep with ADLs, limited household mobility w/ Rw, wife states she is having increased difficulty caring for pt.      Hand Dominance        Extremity/Trunk Assessment   Upper Extremity Assessment Upper Extremity Assessment: Overall WFL for tasks assessed (grossly at least 4/5)    Lower Extremity Assessment Lower Extremity Assessment:  Generalized weakness (grossly at least 3/5)       Communication   Communication: No difficulties  Cognition Arousal/Alertness: Awake/alert Behavior During Therapy: WFL for tasks assessed/performed Overall Cognitive Status: Within Functional Limits for tasks assessed                      General Comments      Exercises Other Exercises Other Exercises: transfer training - sit to stand; 1x4 to improve functional mobility; max assist x2 w/ RW,     Assessment/Plan    PT Assessment Patient needs continued PT services  PT Problem List Decreased strength;Decreased range of motion;Decreased activity tolerance;Decreased balance;Decreased knowledge of use of DME;Decreased knowledge of precautions;Decreased mobility;Decreased safety awareness;Decreased skin integrity          PT Treatment Interventions DME instruction;Gait training;Functional mobility training;Balance training;Therapeutic exercise;Therapeutic activities;Stair training;Wheelchair mobility training;Patient/family education    PT Goals (Current goals can be found in the Care Plan section)  Acute Rehab PT Goals Patient Stated Goal: To go to rehab PT Goal Formulation: With patient/family Time For Goal Achievement: 05/24/16 Potential to Achieve Goals: Fair    Frequency BID   Barriers to discharge Inaccessible home environment;Decreased caregiver support      Co-evaluation               End of Session Equipment Utilized During Treatment: Gait belt Activity Tolerance: Patient limited by fatigue Patient left: in bed;with bed alarm set;with call bell/phone within reach Nurse Communication: Mobility status (notified nursing of pt on bed pan)         Time: [id number] PT Time Calculation (min) (ACUTE ONLY): 23 min   Charges:   PT Evaluation $PT Eval Moderate Complexity: 1 Procedure PT Treatments $Therapeutic Activity: 8-22 mins   PT G Codes:        Riccardo DubinQuinton Shapcott, Student PT 05/10/2016, 3:08  PM  This entire session was performed under direct supervision and direction of a licensed therapist/therapist assistant . I have personally read, edited and approve of the note as written. Encarnacion ChuAshley Santiana Glidden PT, DPT

## 2016-05-10 NOTE — Anesthesia Postprocedure Evaluation (Signed)
Anesthesia Post Note  Patient: Chaney BornJoel L Maron  Procedure(s) Performed: Procedure(s) (LRB): IRRIGATION AND DEBRIDEMENT FOOT WITH PLACEMENT OF ANTIBIOTIC BEADS (Right)  Patient location during evaluation: PACU Anesthesia Type: General Level of consciousness: awake and alert Pain management: pain level controlled Vital Signs Assessment: post-procedure vital signs reviewed and stable Respiratory status: spontaneous breathing, nonlabored ventilation, respiratory function stable and patient connected to nasal cannula oxygen Cardiovascular status: blood pressure returned to baseline and stable Postop Assessment: no signs of nausea or vomiting Anesthetic complications: no     Last Vitals:  Vitals:   05/10/16 0425 05/10/16 0807  BP: 133/66 (!) 154/57  Pulse: 80 76  Resp: 19 18  Temp: 36.4 C 37.3 C    Last Pain:  Vitals:   05/10/16 1410  TempSrc:   PainSc: Asleep                 Cleda MccreedyJoseph K Piscitello

## 2016-05-11 LAB — CBC
HEMATOCRIT: 24.1 % — AB (ref 40.0–52.0)
Hemoglobin: 8.4 g/dL — ABNORMAL LOW (ref 13.0–18.0)
MCH: 31.2 pg (ref 26.0–34.0)
MCHC: 34.6 g/dL (ref 32.0–36.0)
MCV: 90.1 fL (ref 80.0–100.0)
Platelets: 456 10*3/uL — ABNORMAL HIGH (ref 150–440)
RBC: 2.68 MIL/uL — AB (ref 4.40–5.90)
RDW: 15.5 % — AB (ref 11.5–14.5)
WBC: 15.8 10*3/uL — ABNORMAL HIGH (ref 3.8–10.6)

## 2016-05-11 LAB — GLUCOSE, CAPILLARY
GLUCOSE-CAPILLARY: 162 mg/dL — AB (ref 65–99)
Glucose-Capillary: 94 mg/dL (ref 65–99)

## 2016-05-11 LAB — SURGICAL PATHOLOGY

## 2016-05-11 MED ORDER — PIPERACILLIN-TAZOBACTAM 3.375 G IVPB: 3.3750 g | Freq: Three times a day (TID) | INTRAVENOUS | Status: DC

## 2016-05-11 MED ORDER — INSULIN GLARGINE 100 UNIT/ML ~~LOC~~ SOLN: 10.0000 [IU] | Freq: Every day | SUBCUTANEOUS | 0 refills | Status: DC

## 2016-05-11 MED ORDER — FERROUS SULFATE 325 (65 FE) MG PO TABS: 325.0000 mg | ORAL_TABLET | Freq: Three times a day (TID) | ORAL | 2 refills | Status: AC

## 2016-05-11 MED ORDER — OXYCODONE-ACETAMINOPHEN 5-325 MG PO TABS: 1.0000 | ORAL_TABLET | Freq: Four times a day (QID) | ORAL | 0 refills | Status: DC | PRN

## 2016-05-11 MED ORDER — CARVEDILOL 12.5 MG PO TABS
12.5000 mg | ORAL_TABLET | Freq: Two times a day (BID) | ORAL | Status: DC
  Administered 2016-05-11: 12.5 mg via ORAL
  Filled 2016-05-11: qty 1

## 2016-05-11 NOTE — Progress Notes (Signed)
Patient is medically stable for D/C to [facility] today. Per [facility] liaison patient will go to room 709. RN will call report to [address] and arrange EMS for transport. Per [employer] has approved for SNF and once patient meets $3,000 deductible then he will not have a co-pay. Patient has met $279 of his deductible before the hospital stay. Co-pay at [facility] will be 20% per day around $160 per day. Clinical Education officer, museum (CSW) sent D/C orders to [employer] via [employer]. CSW met with patient and his wife Barnett Applebaum at bedside and made them aware of above. Please reconsult if future social work needs arise. CSW signing off.   [employer], LCSW [phone]

## 2016-05-11 NOTE — Care Management Important Message (Signed)
Important Message  Patient Details  Name: Damon [name] L [place] MRN: [id number] Date of Birth: 1950-10-13   Medicare Important Message Given:  Yes    Marily MemosLisa M Shakai Dolley, RN 05/11/2016, 2:30 PM

## 2016-05-11 NOTE — Progress Notes (Signed)
Physical Therapy Treatment Patient Details Name: Damon [name] MRN: [id number] DOB: 11-Jul-1950 Today's Date: 05/11/2016    History of Present Illness Pt 66 yo male w/ R foot ulcer and s/p I&D of R foot. Hisotry of peripheral vascular disease, COPD and CKD    PT Comments    Pt awake, alert and willing to participate in PT treatment. Pt displayed improvements in bed mobility; requiring min assist for movement of R LE and scooting to EOB. Pt hesitant to reach forward in sitting but improved w/ reaching activities under PT supervision. Pt able to perform lateral scooting in bed w/ cuing for leaning forward and use of UE ; required min guarding to min assist to help w/ lift off and to maintain optimal L LE position. He performed squat pivot transfers to wheel chair and chair w/ mod assist and cuing for hand placement and leaning forward. Wbing precautions were maintained throughout session and patient is progressing. He is still limited in safe household mobility due to strength, ROM deficits and decreased activity tolerance. Frequency changed to 7x/week because of pt's diagnosis and per hospital policy. Recommendation remains the same for SNF upon dc from acute hospitalization.    Follow Up Recommendations  SNF     Equipment Recommendations  Rolling walker with 5" wheels;Other (comment) (and wheelchair )    Recommendations for Other Services OT consult     Precautions / Restrictions Precautions Precautions: Fall Restrictions Weight Bearing Restrictions: Yes RLE Weight Bearing: Non weight bearing    Mobility  Bed Mobility Overal bed mobility: Needs Assistance Bed Mobility: Supine to Sit     Supine to sit: Min assist     General bed mobility comments: cuing for hand placment and min assist to guide R LE  Transfers Overall transfer level: Needs assistance Equipment used: Pushed w/c Transfers: Squat Pivot Transfers;Lateral/Scoot Transfers     Squat pivot transfers: Mod assist     Lateral/Scoot Transfers: Min assist General transfer comment: frequent cuing for forward leaning; hand placement and L LE placement, guarding of L LE,   Ambulation/Gait             General Gait Details: did not attempt   Stairs            Wheelchair Mobility    Modified Rankin (Stroke Patients Only)       Balance Overall balance assessment: Needs assistance Sitting-balance support: Bilateral upper extremity supported Sitting balance-Leahy Scale: Fair Sitting balance - Comments: post lean able to support self w/ bilat UE,  Postural control: Posterior lean     Standing balance comment: Did not attempt standing                     Cognition Arousal/Alertness: Awake/alert Behavior During Therapy: WFL for tasks assessed/performed Overall Cognitive Status: Within Functional Limits for tasks assessed                      Exercises Other Exercises Other Exercises: Transfer training; Lateral scooting across to improve functional tasks and prepare pt to transfer to chair; Cuing for forward leaning, hand placement and LE placement; required CGA to min assist; 1x12 Other Exercises: Sitting reaching activities; to promote forward leaning to assist w/ transfers; 1x10, w/ cuing and supervision Other Exercises: Transfer training; squat pivot transfers; mod assist; to allow pt to transfer in and out of chairs; cuing for hand placement, leaning and foot placement, guarding of L LE     General Comments  Pertinent Vitals/Pain Pain Assessment: No/denies pain    Home Living                      Prior Function            PT Goals (current goals can now be found in the care plan section) Acute Rehab PT Goals Patient Stated Goal: To go to rehab PT Goal Formulation: With patient/family Time For Goal Achievement: 05/24/16 Potential to Achieve Goals: Good Progress towards PT goals: Progressing toward goals    Frequency    7X/week       PT Plan Frequency needs to be updated    Co-evaluation             End of Session Equipment Utilized During Treatment: Gait belt Activity Tolerance: Patient tolerated treatment well Patient left: in chair;with chair alarm set;with call bell/phone within reach     Time: 4098-1191 PT Time Calculation (min) (ACUTE ONLY): 30 min  Charges:                       G Codes:      [employer]  Student PT 05/11/2016, 10:21 AM

## 2016-05-11 NOTE — Progress Notes (Signed)
Pharmacy Antibiotic Note  Damon [name] is a 66 y.o. male admitted on 05/03/2016 with wound infection.  Pharmacy has been consulted for piperacillin/tazobactam dosing. Per ID, planned 4-6 week duration of antibiotic therapy. PICC now placed.  Plan: Continue Piperacillin/tazobactam 3.375 g IV q8h EI  Height: 5\' 10"  (177.8 cm) Weight: 160 lb (72.6 kg) IBW/kg (Calculated) : 73  Temp (24hrs), Avg:98.8 F (37.1 C), Min:98.1 F (36.7 C), Max:99.4 F (37.4 C)   Recent Labs Lab 05/05/16 0353  05/06/16 0325 05/06/16 1559 05/07/16 0423 05/08/16 [id number] 05/09/16 0509 05/10/16 0422 05/11/16 0415  WBC  --   < > 16.2*  --  16.5* 18.3* 18.0* 16.8* 15.8*  CREATININE 1.39*  --   --  1.01 1.31*  --  1.38* 1.24  --   VANCOTROUGH  --   --  24* 18  --   --   --   --   --   < > = values in this interval not displayed.  Estimated Creatinine Clearance: 61.8 mL/min (by C-G formula based on SCr of 1.24 mg/dL).    No Known Allergies   Thank you for allowing pharmacy to be a part of this patient's care.  Gardner CandleSheema M Charelle Petrakis, PharmD, BCPS Clinical Pharmacist 05/11/2016 9:10 AM

## 2016-05-11 NOTE — Progress Notes (Signed)
Per Jomarie LongsJoseph [facility] liaison [facility]. Patient can D/C to [facility] when medically stable.   Baker Hughes IncorporatedBailey Janella Rogala, LCSW [phone]

## 2016-05-11 NOTE — Progress Notes (Signed)
EMS here to pick up pt, PICC remains in place.

## 2016-05-11 NOTE — Clinical Social Work Placement (Signed)
   CLINICAL SOCIAL WORK PLACEMENT  NOTE  Date:  05/11/2016  Patient Details  Name: Damon [name] MRN: [id number] Date of Birth: 1950-06-16  Clinical Social Work is seeking post-discharge placement for this patient at the Skilled  Nursing Facility level of care (*CSW will initial, date and re-position this form in  chart as items are completed):  Yes   Patient/family provided with [facility] Clinical Social Work Department's list of facilities offering this level of care within the geographic area requested by the patient (or if unable, by the patient's family).  Yes   Patient/family informed of their freedom to choose among providers that offer the needed level of care, that participate in Medicare, Medicaid or managed care program needed by the patient, have an available bed and are willing to accept the patient.  Yes   Patient/family informed of [facility]'s ownership interest in [facility] and [facility], as well as of the fact that they are under no obligation to receive care at these facilities.  PASRR submitted to EDS on 05/09/16     PASRR number received on 05/09/16     Existing PASRR number confirmed on       FL2 transmitted to all facilities in geographic area requested by pt/family on 05/09/16     FL2 transmitted to all facilities within larger geographic area on       Patient informed that his/her managed care company has contracts with or will negotiate with certain facilities, including the following:        Yes   Patient/family informed of bed offers received.  Patient chooses bed at  ([facility] )     Physician recommends and patient chooses bed at      Patient to be transferred to  ([facility] ) on 05/11/16.  Patient to be transferred to facility by  [facility] )     Patient family notified on 05/11/16 of transfer.  Name of family member notified:   (Patient's wife Vena Austrialeanor was at bedside and aware of D/C today. )     PHYSICIAN       Additional  Comment:    _______________________________________________ Hence Derrick, Darleen CrockerBailey M, LCSW 05/11/2016, 11:33 AM

## 2016-05-11 NOTE — Discharge Instructions (Signed)
Heart healthy and ADA diet. Wound care of right foot. IV zosyn for 4-6 weeks.

## 2016-05-11 NOTE — Discharge Summary (Addendum)
[facility] [place] [facility]   PATIENT NAME: Damon [name]    MR#:  [id number]  DATE OF BIRTH:  06/22/1950  DATE OF ADMISSION:  05/03/2016   ADMITTING PHYSICIAN: Enedina FinnerSona Patel, MD  DATE OF DISCHARGE: 05/11/2016  PRIMARY CARE PHYSICIAN: Emogene MorganAYCOCK, [name], MD   ADMISSION DIAGNOSIS:  Ulcer (HCC) [L98.499] DISCHARGE DIAGNOSIS:  Active Problems:   Right foot ulcer (HCC) Right foot nonhealing dry gangrene/ulcer with foul-smelling, osteomyelitis. SECONDARY DIAGNOSIS:   Past Medical History:  Diagnosis Date  . CKD (chronic kidney disease), stage III   . COPD (chronic obstructive pulmonary disease) (HCC)   . Diabetes mellitus without complication (HCC)   . Diverticulitis   . Essential hypertension   . MI (myocardial infarction)   . PAD (peripheral artery disease) (HCC)    a. 10/2015 s/p PTA and stenting of the RCIA, LCIA, and PTA of the LEIA Environmental manager([place]).  . Tobacco abuse    HOSPITAL COURSE:   Damon [name] a 66 y.o. malewith a known history of Peripheral vascular disease status post stenting and PTCA in July 2017 right common iliac left common iliac and posterior tibial artery, ongoing tobacco abuse, hypertension, history of CKDstage III and COPD comes to the emergency room with fever, generalized weakness and foul-smelling ulcer on the right foot which appears dry and has darkened skin around.   1. Right foot nonhealing dry gangrene/ulcer with foul-smelling, osteomyelitis. -He was started recently on Keflex, changed to IV Zosyn negative blood cultures so far.  Wound culture showed MODERATE ENTEROBACTER CLOACAE and FEW GROUP B STREP(S.AGALACTIAE) MRI evidence of osteomyelitis and abscess. S/p Excisional debridement infected soft tissue and bone right lateral foot today (05/08/2016). Per Dr. Alberteen Spindleline,  may need a follow-up debridement if significant sign of infection. He is still at a significant risk for amputation of his right lower extremity due to the amount of bone  and tissue loss as well as infection in the foot.  better leukocytosis, f/u CBC as outpatient. S/p PICC line. Zosyn every 8 hours for 4-6 weeks per Dr. Sampson GoonFitzgerald.  PAD.  He got angiogram and Percutaneous transluminal angioplasty of right lower extremity on 05/07/2016. -Continue Brilinta and aspirin  2. History of COPD. Stable. -Continue oral inhalers and nebs  3. Hypertension continue home meds  4. Tobacco abuse discussed smoking cessation more than 4 minutes spent.  5. Diabetes   Had hypoglycemia, required Inj D50 05/03/16- so stopped lantus. On Sliding scale. BS is elevated, increased Lantus to 10units at bedtime. BS is normal.  6. CKD stage II. Stable.  Anemia, possible due to chronic disease and acute blood loss.  Hb down to 6.3, up to 8.1 after 2 units of PRBC transfusion. Follow-up Hb and stool occult. Anemia workup: low iron. Hold lovenox for DVT prophylaxis. Started feosol.  Hyponatremia. Improved with normal saline IV.  DISCHARGE CONDITIONS:  Stable, discharge to SNF today. CONSULTS OBTAINED:  Treatment Team:  Annice NeedyJason S Dew, MD Linus Galasodd Cline, DPM Mick Sellavid P Fitzgerald, MD Midge Miniumarren Wohl, MD Gwyneth RevelsJustin Fowler, DPM DRUG ALLERGIES:  No Known Allergies DISCHARGE MEDICATIONS:   Allergies as of 05/11/2016   No Known Allergies     Medication List    STOP taking these medications   carvedilol 12.5 MG tablet Commonly known as:  COREG   cephALEXin 500 MG capsule Commonly known as:  KEFLEX     TAKE these medications   acetaminophen 500 MG tablet Commonly known as:  TYLENOL Take 500 mg by mouth every 6 (six) hours  as needed.   albuterol-ipratropium 18-103 MCG/ACT inhaler Commonly known as:  COMBIVENT Inhale into the lungs every 4 (four) hours as needed for wheezing or shortness of breath.   ARIPiprazole 15 MG tablet Commonly known as:  ABILIFY Take 15 mg by mouth daily.   aspirin EC 81 MG tablet Take 1 tablet (81 mg total) by mouth daily.   atorvastatin 80 MG  tablet Commonly known as:  LIPITOR Take 1 tablet (80 mg total) by mouth daily at 6 PM.   ferrous sulfate 325 (65 FE) MG tablet Take 1 tablet (325 mg total) by mouth 3 (three) times daily with meals.   gabapentin 300 MG capsule Commonly known as:  NEURONTIN Take 300 mg by mouth daily as needed.   insulin glargine 100 UNIT/ML injection Commonly known as:  LANTUS Inject 0.1 mLs (10 Units total) into the skin at bedtime. What changed:  how much to take  when to take this   oxyCODONE-acetaminophen 5-325 MG tablet Commonly known as:  PERCOCET/ROXICET Take 1 tablet by mouth every 6 (six) hours as needed for severe pain.   piperacillin-tazobactam 3.375 GM/50ML IVPB Commonly known as:  ZOSYN Inject 50 mLs (3.375 g total) into the vein every 8 (eight) hours.   ticagrelor 90 MG Tabs tablet Commonly known as:  BRILINTA Take 1 tablet (90 mg total) by mouth 2 (two) times daily.        DISCHARGE INSTRUCTIONS:  See AVS.  If you experience worsening of your admission symptoms, develop shortness of breath, life threatening emergency, suicidal or homicidal thoughts you must seek medical attention immediately by calling 911 or calling your MD immediately  if symptoms less severe.  You Must read complete instructions/literature along with all the possible adverse reactions/side effects for all the Medicines you take and that have been prescribed to you. Take any new Medicines after you have completely understood and accpet all the possible adverse reactions/side effects.   Please note  You were cared for by a hospitalist during your hospital stay. If you have any questions about your discharge medications or the care you received while you were in the hospital after you are discharged, you can call the unit and asked to speak with the hospitalist on call if the hospitalist that took care of you is not available. Once you are discharged, your primary care physician will handle any further  medical issues. Please note that NO REFILLS for any discharge medications will be authorized once you are discharged, as it is imperative that you return to your primary care physician (or establish a relationship with a primary care physician if you do not have one) for your aftercare needs so that they can reassess your need for medications and monitor your lab values.    On the day of Discharge:  VITAL SIGNS:  Blood pressure (!) 161/59, pulse 79, temperature 99.1 F (37.3 C), temperature source Oral, resp. rate 18, height 5\' 10"  (1.778 m), weight 160 lb (72.6 kg), SpO2 100 %. PHYSICAL EXAMINATION:  GENERAL:  66 y.o.-year-old patient lying in the bed with no acute distress.  EYES: Pupils equal, round, reactive to light and accommodation. No scleral icterus. Extraocular muscles intact.  HEENT: Head atraumatic, normocephalic. Oropharynx and nasopharynx clear.  NECK:  Supple, no jugular venous distention. No thyroid enlargement, no tenderness.  LUNGS: Normal breath sounds bilaterally, no wheezing, rales,rhonchi or crepitation. No use of accessory muscles of respiration.  CARDIOVASCULAR: S1, S2 normal. No murmurs, rubs, or gallops.  ABDOMEN: Soft, non-tender,  non-distended. Bowel sounds present. No organomegaly or mass.  EXTREMITIES: No pedal edema, cyanosis, or clubbing. Right foot in dressing.  NEUROLOGIC: Cranial nerves II through XII are intact. Muscle strength 5/5 in all extremities. Sensation intact. Gait not checked.  PSYCHIATRIC: The patient is alert and oriented x 3.  SKIN: No obvious rash, lesion, or ulcer.  DATA REVIEW:   CBC  Recent Labs Lab 05/11/16 0415  WBC 15.8*  HGB 8.4*  HCT 24.1*  PLT 456*    Chemistries   Recent Labs Lab 05/10/16 0422  NA 136  K 3.8  CL 108  CO2 22  GLUCOSE 111*  BUN 20  CREATININE 1.24  CALCIUM 7.9*     Microbiology Results  Results for orders placed or performed during the hospital encounter of 05/03/16  Wound or Superficial  Culture     Status: None   Collection Time: 05/03/16  4:31 PM  Result Value Ref Range Status   Specimen Description WOUND  Final   Special Requests NONE  Final   Gram Stain   Final    RARE WBC PRESENT, PREDOMINANTLY MONONUCLEAR FEW GRAM POSITIVE COCCI IN PAIRS RARE GRAM VARIABLE ROD    Culture   Final    MODERATE ENTEROBACTER CLOACAE FEW GROUP B STREP(S.AGALACTIAE)ISOLATED TESTING AGAINST S. AGALACTIAE NOT ROUTINELY PERFORMED DUE TO PREDICTABILITY OF AMP/PEN/VAN SUSCEPTIBILITY. Performed at [facility], [address] [place], [place] [address]    Report Status 05/06/2016 FINAL  Final   Organism ID, Bacteria ENTEROBACTER CLOACAE  Final      Susceptibility   Enterobacter cloacae - MIC*    CEFAZOLIN >=64 RESISTANT Resistant     CEFEPIME <=1 SENSITIVE Sensitive     CEFTAZIDIME <=1 SENSITIVE Sensitive     CEFTRIAXONE <=1 SENSITIVE Sensitive     CIPROFLOXACIN <=0.25 SENSITIVE Sensitive     GENTAMICIN <=1 SENSITIVE Sensitive     IMIPENEM <=0.25 SENSITIVE Sensitive     TRIMETH/SULFA <=20 SENSITIVE Sensitive     PIP/TAZO <=4 SENSITIVE Sensitive     * MODERATE ENTEROBACTER CLOACAE  Blood culture (routine x 2)     Status: None   Collection Time: 05/03/16  4:31 PM  Result Value Ref Range Status   Specimen Description BLOOD RIGHT HAND  Final   Special Requests   Final    BOTTLES DRAWN AEROBIC AND ANAEROBIC AER ANA   Culture NO GROWTH 5 DAYS  Final   Report Status 05/08/2016 FINAL  Final  Blood culture (routine x 2)     Status: None   Collection Time: 05/03/16  4:31 PM  Result Value Ref Range Status   Specimen Description BLOOD RIGHT FOREARM  Final   Special Requests   Final    BOTTLES DRAWN AEROBIC AND ANAEROBIC AER ANA   Culture NO GROWTH 5 DAYS  Final   Report Status 05/08/2016 FINAL  Final  Aerobic/Anaerobic Culture (surgical/deep wound)     Status: None (Preliminary result)   Collection Time: 05/08/16  1:41 PM  Result Value Ref Range Status    Specimen Description AMP  Final   Special Requests NONE  Final   Gram Stain   Final    RARE WBC PRESENT, PREDOMINANTLY MONONUCLEAR RARE GRAM POSITIVE COCCI Performed at [facility], [address], [place], [place] [address]    Culture   Final    MODERATE GROUP B STREP(S.AGALACTIAE)ISOLATED TESTING AGAINST S. AGALACTIAE NOT ROUTINELY PERFORMED DUE TO PREDICTABILITY OF AMP/PEN/VAN SUSCEPTIBILITY. RARE ENTEROBACTER SPECIES NO  ANAEROBES ISOLATED; CULTURE IN PROGRESS FOR 5 DAYS    Report Status PENDING  Incomplete   Organism ID, Bacteria ENTEROBACTER SPECIES  Final      Susceptibility   Enterobacter species - MIC*    CEFAZOLIN >=64 RESISTANT Resistant     CEFEPIME <=1 SENSITIVE Sensitive     CEFTAZIDIME <=1 SENSITIVE Sensitive     CEFTRIAXONE <=1 SENSITIVE Sensitive     CIPROFLOXACIN <=0.25 SENSITIVE Sensitive     GENTAMICIN <=1 SENSITIVE Sensitive     IMIPENEM <=0.25 SENSITIVE Sensitive     TRIMETH/SULFA <=20 SENSITIVE Sensitive     PIP/TAZO <=4 SENSITIVE Sensitive     * RARE ENTEROBACTER SPECIES    RADIOLOGY:  No results found.   Management plans discussed with the patient, his wife and they are in agreement.  CODE STATUS:     Code Status Orders        Start     Ordered   05/03/16 2255  Full code  Continuous     05/03/16 2254    Code Status History    Date Active Date Inactive Code Status Order ID Comments User Context   03/27/2016  7:34 PM 03/29/2016  5:47 PM Full Code [id number]  Enedina Finner, MD Inpatient   02/14/2016  6:10 PM 02/15/2016  5:03 PM Full Code [id number]  Alford Highland, MD ED   01/13/2016  9:42 AM 01/17/2016 11:47 PM Full Code [id number]  Eugenie Norrie, NP ED      TOTAL TIME TAKING CARE OF THIS PATIENT: 35 minutes.    Shaune Pollack M.D on 05/11/2016 at 11:02 AM  Between 7am to 6pm - Pager - [phone]  After 6pm go to [email or web address] - Social research officer, government  [facility]  Office  [phone]  CC: Primary care  physician; Emogene Morgan, MD   Note: This dictation was prepared with Dragon dictation along with smaller phrase technology. Any transcriptional errors that result from this process are unintentional.

## 2016-05-11 NOTE — Progress Notes (Signed)
Report called to Nicole at [facility], EMS called for transportation.  

## 2016-05-13 LAB — AEROBIC/ANAEROBIC CULTURE W GRAM STAIN (SURGICAL/DEEP WOUND)

## 2016-05-13 LAB — AEROBIC/ANAEROBIC CULTURE (SURGICAL/DEEP WOUND)

## 2016-05-18 ENCOUNTER — Telehealth: Payer: Self-pay | Admitting: Cardiovascular Disease

## 2016-05-18 NOTE — Telephone Encounter (Signed)
Pt FMLA/Disability form for [employer] placed in MD basket

## 2016-06-01 ENCOUNTER — Encounter: Payer: Self-pay | Admitting: Intensive Care

## 2016-06-01 ENCOUNTER — Inpatient Hospital Stay
Admission: EM | Admit: 2016-06-01 | Discharge: 2016-06-11 | DRG: 854 | Disposition: A | Discharge status: Skilled Nursing Facility | Payer: Commercial Managed Care - PPO | Attending: Internal Medicine | Admitting: Internal Medicine

## 2016-06-01 ENCOUNTER — Emergency Department: Payer: Commercial Managed Care - PPO

## 2016-06-01 DIAGNOSIS — E1140 Type 2 diabetes mellitus with diabetic neuropathy, unspecified: Secondary | ICD-10-CM | POA: Diagnosis present

## 2016-06-01 DIAGNOSIS — N183 Chronic kidney disease, stage 3 (moderate): Secondary | ICD-10-CM | POA: Diagnosis present

## 2016-06-01 DIAGNOSIS — I130 Hypertensive heart and chronic kidney disease with heart failure and stage 1 through stage 4 chronic kidney disease, or unspecified chronic kidney disease: Secondary | ICD-10-CM | POA: Diagnosis present

## 2016-06-01 DIAGNOSIS — I2510 Atherosclerotic heart disease of native coronary artery without angina pectoris: Secondary | ICD-10-CM | POA: Diagnosis present

## 2016-06-01 DIAGNOSIS — I255 Ischemic cardiomyopathy: Secondary | ICD-10-CM | POA: Diagnosis present

## 2016-06-01 DIAGNOSIS — R059 Cough, unspecified: Secondary | ICD-10-CM

## 2016-06-01 DIAGNOSIS — I160 Hypertensive urgency: Secondary | ICD-10-CM

## 2016-06-01 DIAGNOSIS — L03818 Cellulitis of other sites: Secondary | ICD-10-CM

## 2016-06-01 DIAGNOSIS — I70261 Atherosclerosis of native arteries of extremities with gangrene, right leg: Secondary | ICD-10-CM | POA: Diagnosis not present

## 2016-06-01 DIAGNOSIS — E1169 Type 2 diabetes mellitus with other specified complication: Secondary | ICD-10-CM | POA: Diagnosis present

## 2016-06-01 DIAGNOSIS — Z01810 Encounter for preprocedural cardiovascular examination: Secondary | ICD-10-CM

## 2016-06-01 DIAGNOSIS — A419 Sepsis, unspecified organism: Secondary | ICD-10-CM | POA: Diagnosis present

## 2016-06-01 DIAGNOSIS — Z9861 Coronary angioplasty status: Secondary | ICD-10-CM | POA: Diagnosis not present

## 2016-06-01 DIAGNOSIS — F17210 Nicotine dependence, cigarettes, uncomplicated: Secondary | ICD-10-CM | POA: Diagnosis present

## 2016-06-01 DIAGNOSIS — E11649 Type 2 diabetes mellitus with hypoglycemia without coma: Secondary | ICD-10-CM | POA: Diagnosis present

## 2016-06-01 DIAGNOSIS — E1122 Type 2 diabetes mellitus with diabetic chronic kidney disease: Secondary | ICD-10-CM | POA: Diagnosis present

## 2016-06-01 DIAGNOSIS — I96 Gangrene, not elsewhere classified: Secondary | ICD-10-CM | POA: Diagnosis present

## 2016-06-01 DIAGNOSIS — Z7982 Long term (current) use of aspirin: Secondary | ICD-10-CM | POA: Diagnosis not present

## 2016-06-01 DIAGNOSIS — M86171 Other acute osteomyelitis, right ankle and foot: Secondary | ICD-10-CM

## 2016-06-01 DIAGNOSIS — I252 Old myocardial infarction: Secondary | ICD-10-CM | POA: Diagnosis not present

## 2016-06-01 DIAGNOSIS — E1152 Type 2 diabetes mellitus with diabetic peripheral angiopathy with gangrene: Secondary | ICD-10-CM | POA: Diagnosis present

## 2016-06-01 DIAGNOSIS — J449 Chronic obstructive pulmonary disease, unspecified: Secondary | ICD-10-CM | POA: Diagnosis present

## 2016-06-01 DIAGNOSIS — D649 Anemia, unspecified: Secondary | ICD-10-CM

## 2016-06-01 DIAGNOSIS — R339 Retention of urine, unspecified: Secondary | ICD-10-CM | POA: Diagnosis not present

## 2016-06-01 DIAGNOSIS — I208 Other forms of angina pectoris: Secondary | ICD-10-CM

## 2016-06-01 DIAGNOSIS — M86671 Other chronic osteomyelitis, right ankle and foot: Secondary | ICD-10-CM | POA: Diagnosis present

## 2016-06-01 DIAGNOSIS — Z794 Long term (current) use of insulin: Secondary | ICD-10-CM | POA: Diagnosis not present

## 2016-06-01 DIAGNOSIS — M869 Osteomyelitis, unspecified: Secondary | ICD-10-CM | POA: Diagnosis not present

## 2016-06-01 DIAGNOSIS — I5022 Chronic systolic (congestive) heart failure: Secondary | ICD-10-CM

## 2016-06-01 DIAGNOSIS — R0789 Other chest pain: Secondary | ICD-10-CM

## 2016-06-01 DIAGNOSIS — E785 Hyperlipidemia, unspecified: Secondary | ICD-10-CM | POA: Diagnosis present

## 2016-06-01 DIAGNOSIS — Z833 Family history of diabetes mellitus: Secondary | ICD-10-CM

## 2016-06-01 DIAGNOSIS — D638 Anemia in other chronic diseases classified elsewhere: Secondary | ICD-10-CM | POA: Diagnosis present

## 2016-06-01 DIAGNOSIS — L0390 Cellulitis, unspecified: Secondary | ICD-10-CM

## 2016-06-01 DIAGNOSIS — I25118 Atherosclerotic heart disease of native coronary artery with other forms of angina pectoris: Secondary | ICD-10-CM

## 2016-06-01 DIAGNOSIS — I5042 Chronic combined systolic (congestive) and diastolic (congestive) heart failure: Secondary | ICD-10-CM | POA: Diagnosis present

## 2016-06-01 DIAGNOSIS — R52 Pain, unspecified: Secondary | ICD-10-CM

## 2016-06-01 DIAGNOSIS — I2089 Other forms of angina pectoris: Secondary | ICD-10-CM

## 2016-06-01 DIAGNOSIS — R05 Cough: Secondary | ICD-10-CM

## 2016-06-01 DIAGNOSIS — D62 Acute posthemorrhagic anemia: Secondary | ICD-10-CM | POA: Diagnosis not present

## 2016-06-01 DIAGNOSIS — K5900 Constipation, unspecified: Secondary | ICD-10-CM | POA: Diagnosis not present

## 2016-06-01 HISTORY — DX: Atherosclerotic heart disease of native coronary artery without angina pectoris: I25.10

## 2016-06-01 HISTORY — DX: Left bundle-branch block, unspecified: I44.7

## 2016-06-01 HISTORY — DX: Chronic combined systolic (congestive) and diastolic (congestive) heart failure: I50.42

## 2016-06-01 HISTORY — DX: Ischemic cardiomyopathy: I25.5

## 2016-06-01 LAB — COMPREHENSIVE METABOLIC PANEL
ALBUMIN: 2.6 g/dL — AB (ref 3.5–5.0)
ALT: 23 U/L (ref 17–63)
ANION GAP: 8 (ref 5–15)
AST: 35 U/L (ref 15–41)
Alkaline Phosphatase: 92 U/L (ref 38–126)
BILIRUBIN TOTAL: 0.4 mg/dL (ref 0.3–1.2)
BUN: 12 mg/dL (ref 6–20)
CHLORIDE: 108 mmol/L (ref 101–111)
CO2: 22 mmol/L (ref 22–32)
Calcium: 8.4 mg/dL — ABNORMAL LOW (ref 8.9–10.3)
Creatinine, Ser: 1.08 mg/dL (ref 0.61–1.24)
GFR calc Af Amer: >60 mL/min (ref >60)
GLUCOSE: 135 mg/dL — AB (ref 65–99)
POTASSIUM: 3.5 mmol/L (ref 3.5–5.1)
Sodium: 138 mmol/L (ref 135–145)
TOTAL PROTEIN: 6.4 g/dL — AB (ref 6.5–8.1)

## 2016-06-01 LAB — URINALYSIS, ROUTINE W REFLEX MICROSCOPIC
Bilirubin Urine: NEGATIVE
GLUCOSE, UA: NEGATIVE mg/dL
Hgb urine dipstick: NEGATIVE
Ketones, ur: NEGATIVE mg/dL
LEUKOCYTES UA: NEGATIVE
NITRITE: NEGATIVE
Protein, ur: NEGATIVE mg/dL
Specific Gravity, Urine: 1.005 (ref 1.005–1.030)
pH: 5 (ref 5.0–8.0)

## 2016-06-01 LAB — CBC WITH DIFFERENTIAL/PLATELET
BASOS ABS: 0 10*3/uL (ref 0–0.1)
BASOS PCT: 1 %
EOS PCT: 1 %
Eosinophils Absolute: 0.1 10*3/uL (ref 0–0.7)
HEMATOCRIT: 26.5 % — AB (ref 40.0–52.0)
Hemoglobin: 9.3 g/dL — ABNORMAL LOW (ref 13.0–18.0)
Lymphocytes Relative: 7 %
Lymphs Abs: 0.4 10*3/uL — ABNORMAL LOW (ref 1.0–3.6)
MCH: 31.7 pg (ref 26.0–34.0)
MCHC: 35.1 g/dL (ref 32.0–36.0)
MCV: 90.2 fL (ref 80.0–100.0)
MONO ABS: 0.6 10*3/uL (ref 0.2–1.0)
MONOS PCT: 10 %
NEUTROS ABS: 4.8 10*3/uL (ref 1.4–6.5)
Neutrophils Relative %: 81 %
PLATELETS: 327 10*3/uL (ref 150–440)
RBC: 2.94 MIL/uL — ABNORMAL LOW (ref 4.40–5.90)
RDW: 16.7 % — AB (ref 11.5–14.5)
WBC: 5.9 10*3/uL (ref 3.8–10.6)

## 2016-06-01 LAB — TROPONIN I
TROPONIN I: 0.05 ng/mL — AB (ref <0.03)
Troponin I: 0.05 ng/mL (ref <0.03)
Troponin I: 0.07 ng/mL (ref <0.03)

## 2016-06-01 LAB — GLUCOSE, CAPILLARY
GLUCOSE-CAPILLARY: 121 mg/dL — AB (ref 65–99)
GLUCOSE-CAPILLARY: 141 mg/dL — AB (ref 65–99)

## 2016-06-01 LAB — MRSA PCR SCREENING: MRSA BY PCR: NEGATIVE

## 2016-06-01 MED ORDER — ACETAMINOPHEN 650 MG RE SUPP: 650.0000 mg | Freq: Four times a day (QID) | RECTAL | Status: DC | PRN

## 2016-06-01 MED ORDER — OXYCODONE-ACETAMINOPHEN 5-325 MG PO TABS
1.0000 | ORAL_TABLET | Freq: Four times a day (QID) | ORAL | Status: DC | PRN
  Administered 2016-06-06 – 2016-06-11 (×13): 1 via ORAL
  Filled 2016-06-01 (×14): qty 1

## 2016-06-01 MED ORDER — SODIUM CHLORIDE 0.9% FLUSH
10.0000 mL | Freq: Two times a day (BID) | INTRAVENOUS | Status: DC
  Administered 2016-06-01: 10 mL
  Administered 2016-06-02 (×2): 20 mL
  Administered 2016-06-03: 10 mL
  Administered 2016-06-03: 20 mL
  Administered 2016-06-04 – 2016-06-05 (×4): 10 mL
  Administered 2016-06-06: 20 mL
  Administered 2016-06-07 – 2016-06-10 (×8): 10 mL

## 2016-06-01 MED ORDER — ONDANSETRON HCL 4 MG PO TABS: 4.0000 mg | ORAL_TABLET | Freq: Three times a day (TID) | ORAL | Status: DC | PRN

## 2016-06-01 MED ORDER — FUROSEMIDE 10 MG/ML IJ SOLN
40.0000 mg | Freq: Once | INTRAMUSCULAR | Status: AC
  Administered 2016-06-01: 40 mg via INTRAVENOUS
  Filled 2016-06-01: qty 4

## 2016-06-01 MED ORDER — ONDANSETRON HCL 4 MG/2ML IJ SOLN: 4.0000 mg | Freq: Four times a day (QID) | INTRAMUSCULAR | Status: DC | PRN

## 2016-06-01 MED ORDER — PANTOPRAZOLE SODIUM 40 MG PO TBEC
40.0000 mg | DELAYED_RELEASE_TABLET | Freq: Every day | ORAL | Status: DC
  Administered 2016-06-01 – 2016-06-11 (×11): 40 mg via ORAL
  Filled 2016-06-01 (×10): qty 1

## 2016-06-01 MED ORDER — ONDANSETRON HCL 4 MG PO TABS: 4.0000 mg | ORAL_TABLET | Freq: Four times a day (QID) | ORAL | Status: DC | PRN

## 2016-06-01 MED ORDER — INSULIN GLARGINE 100 UNIT/ML ~~LOC~~ SOLN
10.0000 [IU] | Freq: Every day | SUBCUTANEOUS | Status: DC
  Administered 2016-06-01 – 2016-06-02 (×2): 10 [IU] via SUBCUTANEOUS
  Filled 2016-06-01 (×3): qty 0.1

## 2016-06-01 MED ORDER — PIPERACILLIN-TAZOBACTAM 3.375 G IVPB
3.3750 g | Freq: Three times a day (TID) | INTRAVENOUS | Status: DC
  Administered 2016-06-01 – 2016-06-06 (×17): 3.375 g via INTRAVENOUS
  Filled 2016-06-01 (×17): qty 50

## 2016-06-01 MED ORDER — INSULIN ASPART 100 UNIT/ML ~~LOC~~ SOLN
0.0000 [IU] | Freq: Every day | SUBCUTANEOUS | Status: DC
  Administered 2016-06-10: 2 [IU] via SUBCUTANEOUS
  Filled 2016-06-01: qty 4
  Filled 2016-06-01: qty 2

## 2016-06-01 MED ORDER — HYDRALAZINE HCL 20 MG/ML IJ SOLN
10.0000 mg | Freq: Four times a day (QID) | INTRAMUSCULAR | Status: DC | PRN
  Administered 2016-06-01: 10 mg via INTRAVENOUS
  Filled 2016-06-01 (×3): qty 1

## 2016-06-01 MED ORDER — ENOXAPARIN SODIUM 40 MG/0.4ML ~~LOC~~ SOLN
40.0000 mg | SUBCUTANEOUS | Status: DC
  Administered 2016-06-01 – 2016-06-05 (×5): 40 mg via SUBCUTANEOUS
  Filled 2016-06-01 (×6): qty 0.4

## 2016-06-01 MED ORDER — ACETAMINOPHEN 325 MG PO TABS
650.0000 mg | ORAL_TABLET | Freq: Four times a day (QID) | ORAL | Status: DC | PRN
Start: 2016-06-01 — End: 2016-06-11
  Administered 2016-06-09 – 2016-06-11 (×3): 650 mg via ORAL
  Filled 2016-06-01 (×3): qty 2

## 2016-06-01 MED ORDER — NITROGLYCERIN IN D5W 200-5 MCG/ML-% IV SOLN
2.0000 ug/min | INTRAVENOUS | Status: DC
  Filled 2016-06-01: qty 250

## 2016-06-01 MED ORDER — LISINOPRIL 20 MG PO TABS
40.0000 mg | ORAL_TABLET | Freq: Every day | ORAL | Status: DC
  Administered 2016-06-02 – 2016-06-11 (×10): 40 mg via ORAL
  Filled 2016-06-01 (×11): qty 2

## 2016-06-01 MED ORDER — SODIUM CHLORIDE 0.9% FLUSH
10.0000 mL | INTRAVENOUS | Status: DC | PRN
  Administered 2016-06-05 – 2016-06-06 (×2): 10 mL
  Filled 2016-06-01 (×2): qty 40

## 2016-06-01 MED ORDER — LISINOPRIL 20 MG PO TABS
20.0000 mg | ORAL_TABLET | Freq: Every day | ORAL | Status: DC
  Administered 2016-06-01: 20 mg via ORAL
  Filled 2016-06-01: qty 1

## 2016-06-01 MED ORDER — HYDRALAZINE HCL 20 MG/ML IJ SOLN
20.0000 mg | Freq: Once | INTRAMUSCULAR | Status: AC
  Administered 2016-06-01: 20 mg via INTRAVENOUS

## 2016-06-01 MED ORDER — SODIUM CHLORIDE 0.9 % IV SOLN: INTRAVENOUS | Status: DC

## 2016-06-01 MED ORDER — HYDRALAZINE HCL 25 MG PO TABS
25.0000 mg | ORAL_TABLET | Freq: Three times a day (TID) | ORAL | Status: DC
  Administered 2016-06-01 – 2016-06-11 (×26): 25 mg via ORAL
  Filled 2016-06-01 (×27): qty 1

## 2016-06-01 MED ORDER — FERROUS SULFATE 325 (65 FE) MG PO TABS
325.0000 mg | ORAL_TABLET | Freq: Three times a day (TID) | ORAL | Status: DC
  Administered 2016-06-01 – 2016-06-11 (×28): 325 mg via ORAL
  Filled 2016-06-01 (×27): qty 1

## 2016-06-01 MED ORDER — SENNOSIDES-DOCUSATE SODIUM 8.6-50 MG PO TABS
1.0000 | ORAL_TABLET | Freq: Every day | ORAL | Status: DC
  Administered 2016-06-01 – 2016-06-11 (×10): 1 via ORAL
  Filled 2016-06-01 (×10): qty 1

## 2016-06-01 MED ORDER — ASPIRIN EC 81 MG PO TBEC
81.0000 mg | DELAYED_RELEASE_TABLET | Freq: Every day | ORAL | Status: DC
  Administered 2016-06-01 – 2016-06-11 (×11): 81 mg via ORAL
  Filled 2016-06-01 (×11): qty 1

## 2016-06-01 MED ORDER — SENNOSIDES-DOCUSATE SODIUM 8.6-50 MG PO TABS
1.0000 | ORAL_TABLET | Freq: Every evening | ORAL | Status: DC | PRN
  Administered 2016-06-07: 1 via ORAL
  Filled 2016-06-01: qty 1

## 2016-06-01 MED ORDER — CARVEDILOL 6.25 MG PO TABS
6.2500 mg | ORAL_TABLET | Freq: Two times a day (BID) | ORAL | Status: DC
  Administered 2016-06-01: 6.25 mg via ORAL
  Filled 2016-06-01: qty 1

## 2016-06-01 MED ORDER — HYDROCODONE-ACETAMINOPHEN 5-325 MG PO TABS: 1.0000 | ORAL_TABLET | ORAL | Status: DC | PRN

## 2016-06-01 MED ORDER — NITROGLYCERIN 2 % TD OINT
1.0000 [in_us] | TOPICAL_OINTMENT | Freq: Once | TRANSDERMAL | Status: AC
  Administered 2016-06-01: 1 [in_us] via TOPICAL
  Filled 2016-06-01: qty 1

## 2016-06-01 MED ORDER — CARVEDILOL 12.5 MG PO TABS
12.5000 mg | ORAL_TABLET | Freq: Two times a day (BID) | ORAL | Status: DC
  Administered 2016-06-02 – 2016-06-11 (×18): 12.5 mg via ORAL
  Filled 2016-06-01 (×19): qty 1

## 2016-06-01 MED ORDER — INSULIN ASPART 100 UNIT/ML ~~LOC~~ SOLN
0.0000 [IU] | Freq: Three times a day (TID) | SUBCUTANEOUS | Status: DC
  Administered 2016-06-01 – 2016-06-03 (×3): 1 [IU] via SUBCUTANEOUS
  Administered 2016-06-04 (×2): 2 [IU] via SUBCUTANEOUS
  Administered 2016-06-04 – 2016-06-05 (×2): 1 [IU] via SUBCUTANEOUS
  Administered 2016-06-05: 3 [IU] via SUBCUTANEOUS
  Administered 2016-06-05 – 2016-06-06 (×2): 2 [IU] via SUBCUTANEOUS
  Administered 2016-06-07: 1 [IU] via SUBCUTANEOUS
  Administered 2016-06-07: 2 [IU] via SUBCUTANEOUS
  Administered 2016-06-07: 1 [IU] via SUBCUTANEOUS
  Administered 2016-06-08: 2 [IU] via SUBCUTANEOUS
  Administered 2016-06-08: 5 [IU] via SUBCUTANEOUS
  Administered 2016-06-08: 1 [IU] via SUBCUTANEOUS
  Administered 2016-06-09: 3 [IU] via SUBCUTANEOUS
  Administered 2016-06-09: 2 [IU] via SUBCUTANEOUS
  Administered 2016-06-10: 3 [IU] via SUBCUTANEOUS
  Administered 2016-06-10 (×2): 5 [IU] via SUBCUTANEOUS
  Administered 2016-06-11: 3 [IU] via SUBCUTANEOUS
  Administered 2016-06-11: 7 [IU] via SUBCUTANEOUS
  Administered 2016-06-11: 5 [IU] via SUBCUTANEOUS
  Filled 2016-06-01: qty 3
  Filled 2016-06-01: qty 1
  Filled 2016-06-01 (×2): qty 2
  Filled 2016-06-01 (×2): qty 1
  Filled 2016-06-01 (×2): qty 5
  Filled 2016-06-01: qty 7
  Filled 2016-06-01: qty 2
  Filled 2016-06-01: qty 1
  Filled 2016-06-01: qty 3
  Filled 2016-06-01: qty 2
  Filled 2016-06-01: qty 1
  Filled 2016-06-01: qty 5
  Filled 2016-06-01 (×2): qty 1
  Filled 2016-06-01 (×2): qty 2
  Filled 2016-06-01: qty 5
  Filled 2016-06-01: qty 3
  Filled 2016-06-01: qty 5

## 2016-06-01 MED ORDER — ATORVASTATIN CALCIUM 20 MG PO TABS
80.0000 mg | ORAL_TABLET | Freq: Every day | ORAL | Status: DC
  Administered 2016-06-01 – 2016-06-11 (×11): 80 mg via ORAL
  Filled 2016-06-01 (×11): qty 4

## 2016-06-01 NOTE — ED Notes (Signed)
Secretary reports RN is tied up and cannot take report at this time. Will call back

## 2016-06-01 NOTE — Progress Notes (Signed)
Patients PICC line occluded in one line. [facility] notified. Waiting for them to return call.

## 2016-06-01 NOTE — Consult Note (Signed)
WOC Nurse wound consult note Reason for Consult: Nonhealing right foot wound with chronic osteomyelitis.  [facility]  Awaiting podiatry and infectious disease consults and will defer to those disciplines at this time.   Wound type: Arterial ulcer with recent revascularzation. Will await pending consults and follow along as needed.  Maple HudsonKaren Makenze Ellett RN BSN CWON Pager [phone]

## 2016-06-01 NOTE — Consult Note (Signed)
Cardiology Consult    Patient ID: Damon [name] MRN: [id number], DOB/AGE: 64-Jul-1952   Admit date: 06/01/2016 Date of Consult: 06/01/2016  Primary Physician: Emogene Morgan, MD Primary Cardiologist: Judie Petit. Kirke Corin, MD  Requesting Provider: Camillia Herter, MD  Patient Profile    66 year old male with a prior history of CAD status post myocardial infarction with LAD and obtuse marginal stenting in October 2017 in the setting of a new left bundle branch block, ischemic cardiomyopathy with an EF of 30-35%, peripheral arterial disease status post bilateral iliac, right popliteal, right SFA, and right peroneal interventions, hypertension, hyperlipidemia, diabetes mellitus, and stage III chronic kidney disease who was admitted Feb 23rd with chest pain, hypertensive urgency, and fever.  Past Medical History   Past Medical History:  Diagnosis Date  . CAD (coronary artery disease)    a. 01/2016 MI/PCI: LM nl, LAD 170m/d (3.25 x 28 Xience DES), 40d, LCX 30ost, OM1 95 (staged - 2.75 x 18 Xience Alpine DES), OM2/3 min irregs, RCA 23m.  . Chronic combined systolic and diastolic CHF (congestive heart failure) (HCC)    a. 01/2016 Echo: Ef 25-30%, Gr1 DD, mild AI, mildly dil LA;  b. 02/2016 Echo: EF 30-35%, apical AK, antsept and ant HK, nl RV fxn.  . CKD (chronic kidney disease), stage III   . COPD (chronic obstructive pulmonary disease) (HCC)   . Diabetes mellitus without complication (HCC)   . Diverticulitis   . Essential hypertension   . Ischemic cardiomyopathy    a. 01/2016 Echo: Ef 25-30%;  b. 02/2016 Echo: EF 30-35%.  Marland Kitchen LBBB (left bundle branch block)   . PAD (peripheral artery disease) (HCC)    a. 10/2015 s/p PTA and stenting of the RCIA, LCIA, and PTA of the LEIA (Schnier); b. 04/2016 PTA or R tibioperoneal trunk & prox PT, PTA/DBA of R Pop and SFA, Viabahn covered stent x 2 to the R SFA and pop (5mm x 25 cm & 5mm x 15cm).  . Tobacco abuse     Past Surgical History:  Procedure Laterality Date  .  CARDIAC CATHETERIZATION N/A 01/13/2016   Procedure: LEFT HEART CATH AND CORONARY ANGIOGRAPHY;  Surgeon: Iran Ouch, MD;  Location: [facility];  Service: Cardiovascular;  Laterality: N/A;  . CARDIAC CATHETERIZATION N/A 01/13/2016   Procedure: Coronary Stent Intervention;  Surgeon: Iran Ouch, MD;  Location: [facility];  Service: Cardiovascular;  Laterality: N/A;  . CARDIAC CATHETERIZATION N/A 01/16/2016   Procedure: Left Heart Cath and Coronary Angiography;  Surgeon: Iran Ouch, MD;  Location: [facility];  Service: Cardiovascular;  Laterality: N/A;  . IRRIGATION AND DEBRIDEMENT FOOT Right 05/08/2016   Procedure: IRRIGATION AND DEBRIDEMENT FOOT WITH PLACEMENT OF ANTIBIOTIC BEADS;  Surgeon: Linus Galas, DPM;  Location: [facility];  Service: Podiatry;  Laterality: Right;  . NECK SURGERY    . PERIPHERAL VASCULAR CATHETERIZATION Left 11/01/2015   Procedure: Lower Extremity Angiography;  Surgeon: Renford Dills, MD;  Location: [facility];  Service: Cardiovascular;  Laterality: Left;  . PERIPHERAL VASCULAR CATHETERIZATION Right 05/07/2016   Procedure: Lower Extremity Angiography;  Surgeon: Annice Needy, MD;  Location: [facility];  Service: Cardiovascular;  Laterality: Right;     Allergies  No Known Allergies  History of Present Illness    66 year old male to above complex past medical history including coronary artery disease status post Left bundle branch block and myocardial infarction in October 2017 with catheterization revealing severe LAD and  obtuse marginal disease, requiring drug-eluting stent placement. EF at that time was 25-30%. Follow-up echo in November showed an EF of 30-35%. Other history includes hypertension, hyperlipidemia, diabetes, stage III chronic kidney disease, tobacco abuse, and peripheral arterial disease status post prior bilateral iliac stenting. He was last seen in cardiology clinic in late December, at which point  he was doing well. He did have an ulceration to his right lower extremity. He was advised to follow-up with vascular surgery. Unfortunately, patient required admission in late January secondary to fever and cellulitis of the right lower extremity. He was evaluated by vascular surgery and subsequently underwent angiography revealing severe right lower extremity disease. He underwent balloon angioplasty of the right peroneal and posterior tibial arteries with stenting of the right superficial femoral artery and right popliteal. He was just discharged February 2.  He has been staying @ SNF since discharge.  He says that he has been on bedrest for about 2 months. He says that he was doing well until yesterday, when he began to experience progressive shortness of breath and mild chest pressure. He says his blood pressures have been running in the 170s. Because of progressive dyspnea and chest pressure, he was taken to the emergency department today. Blood pressure on arrival was 176/76.  ECG w/o acute st/t changes.  CXR w/o pna/chf.  Trop mildly elevated @ 0.05.  He was also febrile. He was admitted for further eval. He continues to note mild chest pressure and ongoing dyspnea. He is being treated with broad-spectrum antibiotics given fever and recent history of cellulitis.  Inpatient Medications    . aspirin EC  81 mg Oral Daily  . atorvastatin  80 mg Oral q1800  . carvedilol  6.25 mg Oral BID WC  . enoxaparin (LOVENOX) injection  40 mg Subcutaneous Q24H  . ferrous sulfate  325 mg Oral TID WC  . insulin aspart  0-5 Units Subcutaneous QHS  . insulin aspart  0-9 Units Subcutaneous TID WC  . insulin glargine  10 Units Subcutaneous QHS  . lisinopril  20 mg Oral Daily  . pantoprazole  40 mg Oral Daily  . piperacillin-tazobactam (ZOSYN)  IV  3.375 g Intravenous Q8H  . senna-docusate  1 tablet Oral Daily    Family History    Family History  Problem Relation Age of Onset  . Intracerebral hemorrhage Mother    . Diabetes Mother   . Cancer Mother   . Diabetes Father     Social History    Social History   Social History  . Marital status: Married    Spouse name: N/A  . Number of children: N/A  . Years of education: N/A   Occupational History  . Not on file.   Social History Main Topics  . Smoking status: Current Every Day Smoker    Packs/day: 0.50    Years: 15.00    Types: Cigarettes  . Smokeless tobacco: Current User  . Alcohol use No  . Drug use: No  . Sexual activity: Not on file   Other Topics Concern  . Not on file   Social History Narrative   Lives in [place] with his wife.  Does not routinely exercise.     Review of Systems    General:  No chills, fever, night sweats or weight changes.  Cardiovascular:  +++ chest pain, +++ dyspnea on exertion, +++ mild RLE edema, +++ orthopnea, no palpitations, paroxysmal nocturnal dyspnea. Dermatological: No rash, lesions/masses Respiratory: No cough, +++ dyspnea Urologic: No  hematuria, dysuria Abdominal:   No nausea, vomiting, diarrhea, bright red blood per rectum, melena, or hematemesis Neurologic:  No visual changes, wkns, changes in mental status. All other systems reviewed and are otherwise negative except as noted above.  Physical Exam    Blood pressure (!) 193/79, pulse 99, temperature 99.2 F (37.3 C), temperature source Oral, resp. rate 18, height 5\' 10"  (1.778 m), weight 158 lb (71.7 kg), SpO2 100 %.  General: Pleasant, NAD Psych: Flat affect. Neuro: Alert and oriented X 3. Moves all extremities spontaneously. HEENT: Normal  Neck: Supple without bruits. JVD to jaw. Lungs:  Resp regular and unlabored, diminished breath sounds bilaterally with scattered rhonchi. Heart: RRR no s3, s4, 3/6 systolic murmur loudest at the left lower sternal border. Abdomen: Soft, non-tender, non-distended, BS + x 4.  Extremities: No clubbing, cyanosis. 1+ right lower extremity/ankle edema. DP/PT/Radials 1+ and equal  bilaterally.  Labs     Recent Labs  06/01/16 1003 06/01/16 1424  TROPONINI 0.05* 0.05*   Lab Results  Component Value Date   WBC 5.9 06/01/2016   HGB 9.3 (L) 06/01/2016   HCT 26.5 (L) 06/01/2016   MCV 90.2 06/01/2016   PLT 327 06/01/2016     Recent Labs Lab 06/01/16 1003  NA 138  K 3.5  CL 108  CO2 22  BUN 12  CREATININE 1.08  CALCIUM 8.4*  PROT 6.4*  BILITOT 0.4  ALKPHOS 92  ALT 23  AST 35  GLUCOSE 135*   Lab Results  Component Value Date   CHOL 94 02/15/2016   HDL 23 (L) 02/15/2016   LDLCALC 58 02/15/2016   TRIG 64 02/15/2016     Radiology Studies    Dg Chest 2 View  Result Date: 06/01/2016 CLINICAL DATA:  Chest pain EXAM: CHEST  2 VIEW COMPARISON:  05/03/2016 FINDINGS: Right upper extremity PICC with tip at the SVC level. Stable borderline cardiomegaly. There is no edema, consolidation, effusion, or pneumothorax. Stable mid thoracic degenerative disc narrowing and endplate sclerosis. IMPRESSION: 1. Stable exam.  No evidence of active disease. 2. Right upper extremity PICC in good position. Electronically Signed   By: Marnee SpringJonathon  Watts M.D.   On: 06/01/2016 10:06   Dg Chest 2 View  Result Date: 05/03/2016 CLINICAL DATA:  Shortness of breath with fever. EXAM: CHEST  2 VIEW COMPARISON:  04/27/2016 FINDINGS: The heart size and mediastinal contours are within normal limits. Both lungs are clear. The visualized skeletal structures are unremarkable. IMPRESSION: No active cardiopulmonary disease. Electronically Signed   By: Kennith CenterEric  Mansell M.D.   On: 05/03/2016 17:37   Mr Foot Right Wo Contrast  Result Date: 05/08/2016 CLINICAL DATA:  Open wound involving the base of the fifth metatarsal. Diabetic. EXAM: MRI OF THE RIGHT FOREFOOT WITHOUT CONTRAST TECHNIQUE: Multiplanar, multisequence MR imaging of the right foot was performed. No intravenous contrast was administered. COMPARISON:  Radiographs 05/03/2016 FINDINGS: Diffuse subcutaneous soft tissue swelling/edema/ fluid  consistent with cellulitis. There is diffuse edema like signal abnormality in the foot musculature consistent with myofasciitis. Large open wound is noted on the lateral aspect of the foot in the region of the base of the fifth metatarsal. There is some underlying fluid. There is also fluid tracking along the plantar aspect of the fourth metatarsal which could be an abscess. Difficult to be certain without contrast. T2 and STIR signal abnormality in the base of the fifth metatarsal and also along the lateral margin of the cuboid suspicious for osteomyelitis. No definite MR findings for septic  arthritis. IMPRESSION: 1. Large open wound along the lateral aspect of the midfoot in the region of the base of the fifth metatarsal. This appears to extend right down to the bone. 2. Diffuse cellulitis and myofasciitis. 3. Fluid collection tracking from the area of the open wound along the plantar aspect of the fourth metatarsal is worrisome for an abscess. 4. Findings suspicious for osteomyelitis involving the base of the fifth metatarsal and the lateral aspect of the cuboid. Electronically Signed   By: Rudie Meyer M.D.   On: 05/08/2016 11:10   Dg Foot Complete Right  Result Date: 05/03/2016 CLINICAL DATA:  Assess right lateral foot ulceration. Current history of diabetes. Initial encounter. EXAM: RIGHT FOOT COMPLETE - 3+ VIEW COMPARISON:  None. FINDINGS: There is no evidence of fracture or dislocation. There is no evidence of osseous erosion. The joint spaces are preserved. There is no evidence of talar subluxation; the subtalar joint is unremarkable in appearance. Small plantar and posterior calcaneal spurs are seen. The known soft tissue ulceration is not well characterized on radiograph. No radiopaque foreign bodies are seen. IMPRESSION: Known soft tissue ulceration not well characterized on radiograph. No radiopaque foreign bodies seen. No evidence of osseous erosion. Electronically Signed   By: Roanna Raider M.D.    On: 05/03/2016 17:42    ECG & Cardiac Imaging    Regular sinus rhythm, 99, left axis deviation, prior anterior infarct, borderline LVH-no acute changes.  Assessment & Plan    1. Coronary artery disease/chest pain with troponin elevation: Patient with a history of myocardial infarction in October 2017 with LAD and obtuse marginal stenting. He reports compliance with asa, brilinta,  blocker, and statin.  He was admitted earlier today in the setting of hypertensive urgency and a 24-hour history of chest pressure and dyspnea. ECG is nonacute. Troponin is mildly elevated at 0.052. I suspect that this represents demand ischemia in the setting of hypertensive urgency and mild volume overload. We'll focus efforts on blood pressure management and continue to trend cardiac markers. Would not pursue further ischemic evaluation at this point unless troponins are significantly higher.  2. Ischemic cardiomyopathy/chronic systolic congestive heart failure: EF of 30-35% by echo in November 2018. That was performed with in a month of his percutaneous interventions and MI. He has mild by mouth overload on exam today. I will give a dose of Lasix now. Continue beta blocker. Will add hydral/IV ntg with plan to switch to PO once bp better.  Will likely add acei as well.  Will need to f/u echo as outpt to re-eval EF now that he is > 3 mos post PCI.  Consider ICD in the future.  3. Hypertensive heart disease with CHF and hypertensive urgency: As above adding IV nitroglycerin. Continue oral beta blocker. Plan to add ACE inhibitor and oral hydralazine if tolerated.  4. Hyperlipidemia: LDL 58 in November. Continue statin therapy.  5. Peripheral arterial disease: Status post recent right lower extremity stenting and drug-eluting balloon angioplasty.  6. Stage III chronic kidney disease: Creatinine stable. Consider adding Ace.  7. Diabetes mellitus: Insulin per internal medicine.  8. Fever: WBC is normal. Recent  admission for cellulitis and right lower extremity percutaneous intervention. Antibodies per internal medicine.  Signed, Nicolasa Ducking, NP 06/01/2016, 4:26 PM

## 2016-06-01 NOTE — ED Provider Notes (Signed)
Time Seen: Approximately 0941  I have reviewed the triage notes  Chief Complaint: Chest Pain   History of Present Illness: Damon [name] is a 66 y.o. male who presents with a history of multiple medical problems including coronary artery disease, peripheral vascular disease, ischemic cardiomyopathy, etc. Patient's describing some chest pressure which started last night. The patient denies any nausea, vomiting, shortness of breath. The patient denies any back or flank discomfort. Patient denies any fever or chills or productive cough. He was transported here by EMS uneventfully and had aspirin prior to arrival. He has a PIC line in placement for outpatient IV antibiotics filled and then his right forehead   Past Medical History:  Diagnosis Date  . CKD (chronic kidney disease), stage III   . COPD (chronic obstructive pulmonary disease) (HCC)   . Diabetes mellitus without complication (HCC)   . Diverticulitis   . Essential hypertension   . MI (myocardial infarction)   . PAD (peripheral artery disease) (HCC)    a. 10/2015 s/p PTA and stenting of the RCIA, LCIA, and PTA of the [employer](Schnier).  . Tobacco abuse     Patient Active Problem List   Diagnosis Date Noted  . Sepsis (HCC) 06/01/2016  . Right foot ulcer (HCC) 05/03/2016  . Diabetes mellitus type 2 with complications (HCC) 05/02/2016  . 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 Protein-calorie malnutrition, severe 03/28/2016  . Acute renal failure (ARF) (HCC) 03/27/2016  . Hypotension 03/27/2016  . SOB (shortness of breath) 02/15/2016  . CKD (chronic kidney disease), stage II 02/15/2016  . CAD (coronary artery disease) 01/17/2016  . Essential hypertension 01/17/2016  . HLD (hyperlipidemia) 01/17/2016  . ST elevation myocardial infarction involving left anterior descending (LAD) coronary artery (HCC)   . Cardiomyopathy, ischemic   . Acute respiratory failure (HCC)  01/13/2016  . NSTEMI (non-ST elevated myocardial infarction) [facility](HCC)     Past Surgical History:  Procedure Laterality Date  . CARDIAC CATHETERIZATION N/A 01/13/2016   Procedure: LEFT HEART CATH AND CORONARY ANGIOGRAPHY;  Surgeon: Iran OuchMuhammad A Arida, MD;  Location: [facility];  Service: Cardiovascular;  Laterality: N/A;  . CARDIAC CATHETERIZATION N/A 01/13/2016   Procedure: Coronary Stent Intervention;  Surgeon: Iran OuchMuhammad A Arida, MD;  Location: [facility];  Service: Cardiovascular;  Laterality: N/A;  . CARDIAC CATHETERIZATION N/A 01/16/2016   Procedure: Left Heart Cath and Coronary Angiography;  Surgeon: Iran OuchMuhammad A Arida, MD;  Location: [facility];  Service: Cardiovascular;  Laterality: N/A;  . IRRIGATION AND DEBRIDEMENT FOOT Right 05/08/2016   Procedure: IRRIGATION AND DEBRIDEMENT FOOT WITH PLACEMENT OF ANTIBIOTIC BEADS;  Surgeon: Linus Galasodd Cline, DPM;  Location: [facility];  Service: Podiatry;  Laterality: Right;  . NECK SURGERY    . PERIPHERAL VASCULAR CATHETERIZATION Left 11/01/2015   Procedure: Lower Extremity Angiography;  Surgeon: Renford DillsGregory G Schnier, MD;  Location: [facility];  Service: Cardiovascular;  Laterality: Left;  . PERIPHERAL VASCULAR CATHETERIZATION Right 05/07/2016   Procedure: Lower Extremity Angiography;  Surgeon: Annice NeedyJason S Dew, MD;  Location: [facility];  Service: Cardiovascular;  Laterality: Right;    Past Surgical History:  Procedure Laterality Date  . CARDIAC CATHETERIZATION N/A 01/13/2016   Procedure: LEFT HEART CATH AND CORONARY ANGIOGRAPHY;  Surgeon: Iran OuchMuhammad A Arida, MD;  Location: [facility];  Service: Cardiovascular;  Laterality: N/A;  . CARDIAC CATHETERIZATION N/A 01/13/2016   Procedure: Coronary Stent Intervention;  Surgeon: Jerolyn CenterMuhammad  Argentina Donovan, MD;  Location: [facility];  Service: Cardiovascular;  Laterality: N/A;  . CARDIAC CATHETERIZATION N/A 01/16/2016   Procedure: Left Heart Cath and Coronary Angiography;   Surgeon: Iran Ouch, MD;  Location: [facility];  Service: Cardiovascular;  Laterality: N/A;  . IRRIGATION AND DEBRIDEMENT FOOT Right 05/08/2016   Procedure: IRRIGATION AND DEBRIDEMENT FOOT WITH PLACEMENT OF ANTIBIOTIC BEADS;  Surgeon: Linus Galas, DPM;  Location: [facility];  Service: Podiatry;  Laterality: Right;  . NECK SURGERY    . PERIPHERAL VASCULAR CATHETERIZATION Left 11/01/2015   Procedure: Lower Extremity Angiography;  Surgeon: Renford Dills, MD;  Location: [facility];  Service: Cardiovascular;  Laterality: Left;  . PERIPHERAL VASCULAR CATHETERIZATION Right 05/07/2016   Procedure: Lower Extremity Angiography;  Surgeon: Annice Needy, MD;  Location: [facility];  Service: Cardiovascular;  Laterality: Right;    Current Outpatient Rx  . Order #: [id number] Class: Print  . Order #: [id number] Class: Normal  . Order #: [id number] Class: Historical Med  . Order #: [id number] Class: No Print  . Order #: [id number] Class: No Print  . Order #: [id number] Class: Historical Med  . Order #: [id number] Class: Historical Med  . Order #: [id number] Class: Historical Med  . Order #: [id number] Class: No Print  . Order #: [id number] Class: Historical Med  . Order #: [id number] Class: Historical Med  . Order #: [id number] Class: Historical Med  . Order #: [id number] Class: Historical Med  . Order #: [id number] Class: Print  . Order #: [id number] Class: Normal    Allergies:  Patient has no known allergies.  Family History: Family History  Problem Relation Age of Onset  . Intracerebral hemorrhage Mother   . Diabetes Mother   . Cancer Mother   . Diabetes Father     Social History: Social History  Substance Use Topics  . Smoking status: Current Every Day Smoker    Packs/day: 0.50    Years: 15.00    Types: Cigarettes  . Smokeless tobacco: Current User  . Alcohol use No     Review of Systems:   10 point review of systems was performed and was otherwise negative: Patient's  somewhat poor historian and has difficulty answering straightforward questions Constitutional: No fever Eyes: No visual disturbances ENT: No sore throat, ear pain Cardiac: Very mild chest pain at present and states it occurred intermittently overnight Respiratory: No shortness of breath, wheezing, or stridor Abdomen: No abdominal pain, no vomiting, No diarrhea Endocrine: No weight loss, No night sweats Extremities: No peripheral edema, cyanosis Skin: No rashes, easy bruising Neurologic: No focal weakness, trouble with speech or swollowing Urologic: No dysuria, Hematuria, or urinary frequency   Physical Exam:  ED Triage Vitals  Enc Vitals Group     BP 06/01/16 0930 (!) 176/76     Pulse Rate 06/01/16 0930 94     Resp 06/01/16 0930 15     Temp 06/01/16 0933 (!) 100.7 F (38.2 C)     Temp Source 06/01/16 0933 Oral     SpO2 06/01/16 0930 99 %     Weight 06/01/16 0934 158 lb (71.7 kg)     Height 06/01/16 0934 5\' 10"  (1.778 m)     Head Circumference --      Peak Flow --      Pain Score 06/01/16 0934 5     Pain Loc --      Pain Edu? --      Excl. in GC? --     General: Awake , Alert ,  and Oriented times 3; GCS 15 Head: Normal cephalic , atraumatic Eyes: Pupils equal , round, reactive to light Nose/Throat: No nasal drainage, patent upper airway without erythema or exudate.  Neck: Supple, Full range of motion, No anterior adenopathy or palpable thyroid masses Lungs: Clear to ascultation without wheezes , rhonchi, or rales Heart: Regular rate, regular rhythm with ejection murmur left sternal border without gallops or rubs Abdomen: Soft, non tender without rebound, guarding , or rigidity; bowel sounds positive and symmetric in all 4 quadrants. No organomegaly .        Extremities: 2 plus symmetric pulses. No edema, clubbing or cyanosis Neurologic: normal ambulation, Motor symmetric without deficits, sensory intact Skin: warm, dry, no rashes No reproducible chest wall pain  Labs:    All laboratory work was reviewed including any pertinent negatives or positives listed below:  Labs Reviewed  CBC WITH DIFFERENTIAL/PLATELET - Abnormal; Notable for the following:       Result Value   RBC 2.94 (*)    Hemoglobin 9.3 (*)    HCT 26.5 (*)    RDW 16.7 (*)    Lymphs Abs 0.4 (*)    All other components within normal limits  COMPREHENSIVE METABOLIC PANEL - Abnormal; Notable for the following:    Glucose, Bld 135 (*)    Calcium 8.4 (*)    Total Protein 6.4 (*)    Albumin 2.6 (*)    All other components within normal limits  TROPONIN I - Abnormal; Notable for the following:    Troponin I 0.05 (*)    All other components within normal limits  TROPONIN I  TROPONIN I  TROPONIN I  Patient's had elevated troponins in the past.  EKG:  ED ECG REPORT I, Jennye Moccasin, the attending physician, personally viewed and interpreted this ECG.  Date: 06/01/2016 EKG Time: 0928 Rate:99 Rhythm: normal sinus rhythm QRS Axis: normal Intervals: normal ST/T Wave abnormalities: normal Conduction Disturbances: none Narrative Interpretation: unremarkable Poor R progression noticed in the septal leads and also QRS complexes noticed inferiorly. Old ischemic changes No acute ST elevation or depression is noted  Radiology:  "Dg Chest 2 View  Result Date: 06/01/2016 CLINICAL DATA:  Chest pain EXAM: CHEST  2 VIEW COMPARISON:  05/03/2016 FINDINGS: Right upper extremity PICC with tip at the SVC level. Stable borderline cardiomegaly. There is no edema, consolidation, effusion, or pneumothorax. Stable mid thoracic degenerative disc narrowing and endplate sclerosis. IMPRESSION: 1. Stable exam.  No evidence of active disease. 2. Right upper extremity PICC in good position. Electronically Signed   By: Marnee Spring M.D.   On: 06/01/2016 10:06   Dg Chest 2 View  Result Date: 05/03/2016 CLINICAL DATA:  Shortness of breath with fever. EXAM: CHEST  2 VIEW COMPARISON:  04/27/2016 FINDINGS: The  heart size and mediastinal contours are within normal limits. Both lungs are clear. The visualized skeletal structures are unremarkable. IMPRESSION: No active cardiopulmonary disease. Electronically Signed   By: Kennith Center M.D.   On: 05/03/2016 17:37   Mr Foot Right Wo Contrast  Result Date: 05/08/2016 CLINICAL DATA:  Open wound involving the base of the fifth metatarsal. Diabetic. EXAM: MRI OF THE RIGHT FOREFOOT WITHOUT CONTRAST TECHNIQUE: Multiplanar, multisequence MR imaging of the right foot was performed. No intravenous contrast was administered. COMPARISON:  Radiographs 05/03/2016 FINDINGS: Diffuse subcutaneous soft tissue swelling/edema/ fluid consistent with cellulitis. There is diffuse edema like signal abnormality in the foot musculature consistent with myofasciitis. Large open wound is noted on the lateral  aspect of the foot in the region of the base of the fifth metatarsal. There is some underlying fluid. There is also fluid tracking along the plantar aspect of the fourth metatarsal which could be an abscess. Difficult to be certain without contrast. T2 and STIR signal abnormality in the base of the fifth metatarsal and also along the lateral margin of the cuboid suspicious for osteomyelitis. No definite MR findings for septic arthritis. IMPRESSION: 1. Large open wound along the lateral aspect of the midfoot in the region of the base of the fifth metatarsal. This appears to extend right down to the bone. 2. Diffuse cellulitis and myofasciitis. 3. Fluid collection tracking from the area of the open wound along the plantar aspect of the fourth metatarsal is worrisome for an abscess. 4. Findings suspicious for osteomyelitis involving the base of the fifth metatarsal and the lateral aspect of the cuboid. Electronically Signed   By: Rudie Meyer M.D.   On: 05/08/2016 11:10   Dg Foot Complete Right  Result Date: 05/03/2016 CLINICAL DATA:  Assess right lateral foot ulceration. Current history of  diabetes. Initial encounter. EXAM: RIGHT FOOT COMPLETE - 3+ VIEW COMPARISON:  None. FINDINGS: There is no evidence of fracture or dislocation. There is no evidence of osseous erosion. The joint spaces are preserved. There is no evidence of talar subluxation; the subtalar joint is unremarkable in appearance. Small plantar and posterior calcaneal spurs are seen. The known soft tissue ulceration is not well characterized on radiograph. No radiopaque foreign bodies are seen. IMPRESSION: Known soft tissue ulceration not well characterized on radiograph. No radiopaque foreign bodies seen. No evidence of osseous erosion. Electronically Signed   By: Roanna Raider M.D.   On: 05/03/2016 17:42  "  I personally reviewed the radiologic studies     ED Course: * Differential includes all life-threatening causes for chest pain. This includes but is not exclusive to acute coronary syndrome, aortic dissection, pulmonary embolism, cardiac tamponade, community-acquired pneumonia, pericarditis, musculoskeletal chest wall pain, etc. Given the patient's current clinical presentation and objective findings still concerned for acute coronary syndrome especially given his past history and elevated troponin. A Shantz case was reviewed with the hospitalist team, further disposition and management depends upon her evaluation.     Assessment:  Acute unspecified chest pain   Final Clinical Impression:   Final diagnoses:  Atypical chest pain     Plan: Inpatient            Jennye Moccasin, MD 06/01/16 1446

## 2016-06-01 NOTE — ED Notes (Signed)
Report called to med surg. After report given to floor, pt's bed assignment changed.

## 2016-06-01 NOTE — ED Triage Notes (Signed)
Patient arrived by EMS from [facility] for left chest pressure that began last night. Pt reports staff would not call ambulance last night for his chest pain. Staff at [facility] administered X3 nitro and took patients pain from 10 to 5. A&O x3. Patient has PICC line present on arrival that he gets antibiotics through for R foot wound. EMS vitals 176/80, 99RA, and HR 98. Denies N/V

## 2016-06-01 NOTE — ED Notes (Signed)
Patient transported to X-ray 

## 2016-06-01 NOTE — Plan of Care (Signed)
Problem: Skin Integrity: Goal: Risk for impaired skin integrity will decrease Outcome: Not Progressing Will place a dietary consult order and encourage q2h turns

## 2016-06-01 NOTE — Progress Notes (Signed)
Pharmacy Antibiotic Note  Damon [name] is a 66 y.o. male admitted on 06/01/2016 with wound infection.  Pharmacy has been consulted for zosyn dosing.  Plan: Zosyn 3.375g IV q8h (4 hour infusion).   Height: 5\' 10"  (177.8 cm) Weight: 158 lb (71.7 kg) IBW/kg (Calculated) : 73  Temp (24hrs), Avg:100.5 F (38.1 C), Min:100.2 F (37.9 C), Max:100.7 F (38.2 C)   Recent Labs Lab 06/01/16 1003  WBC 5.9  CREATININE 1.08    Estimated Creatinine Clearance: 70.1 mL/min (by C-G formula based on SCr of 1.08 mg/dL).    No Known Allergies  Antimicrobials this admission:   Dose adjustments this admission:   Microbiology results: 2/23 BCx: NG 2/23 WndCx (extremity amputation): Enterobacter Cloacae pan sensitive except for Cefazolin  Thank you for allowing pharmacy to be a part of this patient's care.  Thomasene Rippleavid Jamilia Jacques, PharmD, BCPS Clinical Pharmacist 06/01/2016

## 2016-06-01 NOTE — H&P (Signed)
[facility] - [place] at [facility]   PATIENT NAME: Damon [name]    MR#:  [id number]  DATE OF BIRTH:  11/23/1950  DATE OF ADMISSION:  06/01/2016  PRIMARY CARE PHYSICIAN: Emogene Morgan, MD   REQUESTING/REFERRING PHYSICIAN: dr Huel Cote  CHIEF COMPLAINT:   Fever chest pain HISTORY OF PRESENT ILLNESS:  Damon [name]  is a 66 y.o. 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 Patient reports that approximately 9:00 this morning while he was in bed he developed chest pain/pressure without radiation to his arm or jaw. This was not relieved until he was brought to the emergency room and he received nitroglycerin and his blood pressure came down. Systolic blood pressure was greater than 200 upon EMS arrival. Patient denies foot pain or chills. He does state that his foot wound Bandage needs to be changed. He is currently chest pain/pressure-free.   PAST MEDICAL HISTORY:   Past Medical History:  Diagnosis Date  . CKD (chronic kidney disease), stage III   . COPD (chronic obstructive pulmonary disease) (HCC)   . Diabetes mellitus without complication (HCC)   . Diverticulitis   . Essential hypertension   . MI (myocardial infarction)   . PAD (peripheral artery disease) (HCC)    a. 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  . Tobacco abuse     PAST SURGICAL HISTORY:   Past Surgical History:  Procedure Laterality Date  . CARDIAC CATHETERIZATION N/A 01/13/2016   Procedure: LEFT HEART CATH AND CORONARY ANGIOGRAPHY;  Surgeon: Iran Ouch, MD;  Location: [facility] INVASIVE CV LAB;  Service: Cardiovascular;  Laterality: N/A;  . CARDIAC CATHETERIZATION N/A 01/13/2016   Procedure: Coronary Stent Intervention;  Surgeon: Iran Ouch, MD;   Location: [facility] INVASIVE CV LAB;  Service: Cardiovascular;  Laterality: N/A;  . CARDIAC CATHETERIZATION N/A 01/16/2016   Procedure: Left Heart Cath and Coronary Angiography;  Surgeon: Iran Ouch, MD;  Location: [facility] INVASIVE CV LAB;  Service: Cardiovascular;  Laterality: N/A;  . IRRIGATION AND DEBRIDEMENT FOOT Right 05/08/2016   Procedure: IRRIGATION AND DEBRIDEMENT FOOT WITH PLACEMENT OF ANTIBIOTIC BEADS;  Surgeon: Linus Galas, DPM;  Location: [facility] ORS;  Service: Podiatry;  Laterality: Right;  . NECK SURGERY    . PERIPHERAL VASCULAR CATHETERIZATION Left 11/01/2015   Procedure: Lower Extremity Angiography;  Surgeon: Renford Dills, MD;  Location: [facility] INVASIVE CV LAB;  Service: Cardiovascular;  Laterality: Left;  . PERIPHERAL VASCULAR CATHETERIZATION Right 05/07/2016   Procedure: Lower Extremity Angiography;  Surgeon: Annice Needy, MD;  Location: [facility] INVASIVE CV LAB;  Service: Cardiovascular;  Laterality: Right;    SOCIAL HISTORY:   Social History  Substance Use Topics  . Smoking status: Current Every Day Smoker    Packs/day: 0.50    Years: 15.00    Types: Cigarettes  . Smokeless tobacco: Current User  . Alcohol use No    FAMILY HISTORY:   Family History  Problem Relation Age of Onset  . Intracerebral hemorrhage Mother   . Diabetes Mother   . Cancer Mother   . Diabetes Father     DRUG ALLERGIES:  No Known Allergies  REVIEW OF SYSTEMS:   Review of Systems  Constitutional: Positive for fever and malaise/fatigue. Negative for chills.  HENT:  Negative.  Negative for ear discharge, ear pain, hearing loss, nosebleeds and sore throat.   Eyes: Negative.  Negative for blurred vision and pain.  Respiratory: Negative.  Negative for cough, hemoptysis, shortness of breath and wheezing.   Cardiovascular: Positive for chest pain. Negative for palpitations and leg swelling.  Gastrointestinal: Negative.  Negative for abdominal pain, blood in stool, diarrhea, nausea and vomiting.   Genitourinary: Negative.  Negative for dysuria.  Musculoskeletal: Negative.  Negative for back pain.  Skin: Negative.        Right foot gangrene/osteomyelitis  Neurological: Negative for dizziness, tremors, speech change, focal weakness, seizures and headaches.  Endo/Heme/Allergies: Negative.  Does not bruise/bleed easily.  Psychiatric/Behavioral: Negative.  Negative for depression, hallucinations and suicidal ideas.    MEDICATIONS AT HOME:   Prior to Admission medications   Medication Sig Start Date End Date Taking? Authorizing Provider  aspirin EC 81 MG tablet Take 1 tablet (81 mg total) by mouth daily. 02/07/16  Yes Ryan M Dunn, PA-C  atorvastatin (LIPITOR) 80 MG tablet Take 1 tablet (80 mg total) by mouth daily at 6 PM. 01/17/16  Yes Enid Baas, MD  carvedilol (COREG) 6.25 MG tablet Take 6.25 mg by mouth 2 (two) times daily with a meal.   Yes Historical Provider, MD  ferrous sulfate 325 (65 FE) MG tablet Take 1 tablet (325 mg total) by mouth 3 (three) times daily with meals. 05/11/16  Yes Shaune Pollack, MD  insulin glargine (LANTUS) 100 UNIT/ML injection Inject 0.1 mLs (10 Units total) into the skin at bedtime. 05/11/16  Yes Shaune Pollack, MD  metFORMIN (GLUCOPHAGE) 500 MG tablet Take by mouth 2 (two) times daily with a meal.   Yes Historical Provider, MD  omeprazole (PRILOSEC) 20 MG capsule Take 20 mg by mouth daily.   Yes Historical Provider, MD  ondansetron (ZOFRAN) 4 MG tablet Take 4 mg by mouth every 8 (eight) hours as needed for nausea or vomiting.   Yes Historical Provider, MD  piperacillin-tazobactam (ZOSYN) 3.375 GM/50ML IVPB Inject 50 mLs (3.375 g total) into the vein every 8 (eight) hours. 05/11/16  Yes Shaune Pollack, MD  sennosides-docusate sodium (SENOKOT-S) 8.6-50 MG tablet Take 1 tablet by mouth daily.   Yes Historical Provider, MD  acetaminophen (TYLENOL) 500 MG tablet Take 500 mg by mouth every 6 (six) hours as needed.    Historical Provider, MD  albuterol-ipratropium (COMBIVENT)  18-103 MCG/ACT inhaler Inhale into the lungs every 4 (four) hours as needed for wheezing or shortness of breath.    Historical Provider, MD  gabapentin (NEURONTIN) 300 MG capsule Take 300 mg by mouth daily as needed.     Historical Provider, MD  oxyCODONE-acetaminophen (PERCOCET/ROXICET) 5-325 MG tablet Take 1 tablet by mouth every 6 (six) hours as needed for severe pain. 05/11/16   Shaune Pollack, MD  ticagrelor (BRILINTA) 90 MG TABS tablet Take 1 tablet (90 mg total) by mouth 2 (two) times daily. Patient not taking: Reported on 06/01/2016 01/17/16   Enid Baas, MD      VITAL SIGNS:  Blood pressure (!) 176/76, pulse 98, temperature (!) 100.7 F (38.2 C), temperature source Oral, resp. rate (!) 28, height 5\' 10"  (1.778 m), weight 71.7 kg (158 lb), SpO2 100 %.  PHYSICAL EXAMINATION:   Physical Exam  Constitutional: He is oriented to person, place, and time and well-developed, well-nourished, and in no distress. No distress.  HENT:  Head: Normocephalic.  Eyes: No scleral icterus.  Neck: Normal range of motion. Neck supple. No JVD present.  No tracheal deviation present.  Cardiovascular: Normal rate, regular rhythm and normal heart sounds.  Exam reveals no gallop and no friction rub.   No murmur heard. Pulmonary/Chest: Effort normal and breath sounds normal. No respiratory distress. He has no wheezes. He has no rales. He exhibits no tenderness.  Abdominal: Soft. Bowel sounds are normal. He exhibits no distension and no mass. There is no tenderness. There is no rebound and no guarding.  Musculoskeletal: Normal range of motion. He exhibits no edema.  Neurological: He is alert and oriented to person, place, and time.  Skin: Skin is warm. No rash noted. No erythema.  Foot is covered Some very minimal edema surrounding bandage  Psychiatric: Affect and judgment normal.      LABORATORY PANEL:   CBC  Recent Labs Lab 06/01/16 1003  WBC 5.9  HGB 9.3*  HCT 26.5*  PLT 327    ------------------------------------------------------------------------------------------------------------------  Chemistries   Recent Labs Lab 06/01/16 1003  NA 138  K 3.5  CL 108  CO2 22  GLUCOSE 135*  BUN 12  CREATININE 1.08  CALCIUM 8.4*  AST 35  ALT 23  ALKPHOS 92  BILITOT 0.4   ------------------------------------------------------------------------------------------------------------------  Cardiac Enzymes  Recent Labs Lab 06/01/16 1003  TROPONINI 0.05*   ------------------------------------------------------------------------------------------------------------------  RADIOLOGY:  Dg Chest 2 View  Result Date: 06/01/2016 CLINICAL DATA:  Chest pain EXAM: CHEST  2 VIEW COMPARISON:  05/03/2016 FINDINGS: Right upper extremity PICC with tip at the SVC level. Stable borderline cardiomegaly. There is no edema, consolidation, effusion, or pneumothorax. Stable mid thoracic degenerative disc narrowing and endplate sclerosis. IMPRESSION: 1. Stable exam.  No evidence of active disease. 2. Right upper extremity PICC in good position. Electronically Signed   By: Marnee SpringJonathon  Watts M.D.   On: 06/01/2016 10:06    EKG:   Sinus rhythm with left anterior fascicular block no ST elevation or depression  IMPRESSION AND PLAN:   66 year old male with history of peripheral vascular disease status post stenting and PTCA in July, CAD, can make kidney disease stage III and recent hospitalization for right foot nonhealing ulcer with osteomyelitis who presents with chest pain and sepsis.  1. Sepsis: Patient presents with fever and tachypnea Order blood cultures Order UA ID and podiatry consultation Consider removing PICC line if fevers persist Continue Zosyn for now  2. Chest pain/pressure with history of CAD recent NSTEMI: Continue telemetry and monitoring of troponins First troponin is 0.05, of note patient has had slightly elevated troponin in the past [facility] cardiology  consultation Continue atorvastatin, Coreg, NTG patch  3. Accelerated hypertension: Continue Coreg and add ACE inhibitor Monitor creatinine  4. Chronic kidney disease stage III with improved creatinine from baseline  5. Diabetes: Hold oral medications. Continue Lantus with sliding scale insulin Diabetes coordinator consultation  6.Tobacco dependence: Patient is encouraged to quit smoking. Counseling was provided for 4 minutes.  7. Anemia of chronic disease: Patient has received PRBCs during last hospitalization. Continue to monitor CBC.  8. Right foot osteo: Podiatry and wound care consultation as well as ID consult  All the records are reviewed and case discussed with ED provider. Management plans discussed with the patient and he is in agreement  CODE STATUS: FULL  TOTAL TIME TAKING CARE OF THIS PATIENT: 50 minutes.    Michaeljoseph Revolorio M.D on 06/01/2016 at 11:57 AM  Between 7am to 6pm - Pager - [phone]  After 6pm go to [email or web address] - Social research officer, [email or web address] [email or web address]  [facility]  Office  [phone]  CC:  Primary care physician; Donnie Coffin, MD

## 2016-06-02 ENCOUNTER — Inpatient Hospital Stay: Payer: Commercial Managed Care - PPO

## 2016-06-02 DIAGNOSIS — I160 Hypertensive urgency: Secondary | ICD-10-CM

## 2016-06-02 DIAGNOSIS — I208 Other forms of angina pectoris: Secondary | ICD-10-CM

## 2016-06-02 LAB — CBC
HCT: 27.1 % — ABNORMAL LOW (ref 40.0–52.0)
Hemoglobin: 9.8 g/dL — ABNORMAL LOW (ref 13.0–18.0)
MCH: 32.5 pg (ref 26.0–34.0)
MCHC: 36.2 g/dL — ABNORMAL HIGH (ref 32.0–36.0)
MCV: 89.8 fL (ref 80.0–100.0)
PLATELETS: 323 10*3/uL (ref 150–440)
RBC: 3.02 MIL/uL — AB (ref 4.40–5.90)
RDW: 16.6 % — ABNORMAL HIGH (ref 11.5–14.5)
WBC: 6.2 10*3/uL (ref 3.8–10.6)

## 2016-06-02 LAB — GLUCOSE, CAPILLARY
GLUCOSE-CAPILLARY: 79 mg/dL (ref 65–99)
GLUCOSE-CAPILLARY: 97 mg/dL (ref 65–99)
GLUCOSE-CAPILLARY: 90 mg/dL (ref 65–99)
Glucose-Capillary: 110 mg/dL — ABNORMAL HIGH (ref 65–99)

## 2016-06-02 LAB — BASIC METABOLIC PANEL
Anion gap: 9 (ref 5–15)
BUN: 16 mg/dL (ref 6–20)
CHLORIDE: 106 mmol/L (ref 101–111)
CO2: 22 mmol/L (ref 22–32)
CREATININE: 1.25 mg/dL — AB (ref 0.61–1.24)
Calcium: 8.4 mg/dL — ABNORMAL LOW (ref 8.9–10.3)
GFR calc non Af Amer: 59 mL/min — ABNORMAL LOW (ref >60)
Glucose, Bld: 119 mg/dL — ABNORMAL HIGH (ref 65–99)
Potassium: 3.2 mmol/L — ABNORMAL LOW (ref 3.5–5.1)
Sodium: 137 mmol/L (ref 135–145)

## 2016-06-02 LAB — HIV ANTIBODY (ROUTINE TESTING W REFLEX): HIV Screen 4th Generation wRfx: NONREACTIVE

## 2016-06-02 LAB — TROPONIN I: Troponin I: 0.08 ng/mL (ref <0.03)

## 2016-06-02 MED ORDER — POTASSIUM CHLORIDE CRYS ER 20 MEQ PO TBCR
20.0000 meq | EXTENDED_RELEASE_TABLET | Freq: Once | ORAL | Status: AC
  Administered 2016-06-02: 20 meq via ORAL
  Filled 2016-06-02: qty 1

## 2016-06-02 NOTE — NC FL2 (Signed)
[place] MEDICAID FL2 LEVEL OF CARE SCREENING TOOL     IDENTIFICATION  Patient Name: Damon [name] Birthdate: 1951-03-02 Sex: male Admission Date (Current Location): 06/01/2016  [place] and [place] Number:  Chiropodist and Address:  [facility], [address], [place], [place] [address]      Provider Number: [id number]  Attending Physician Name and Address:  Auburn Bilberry, MD  Relative Name and Phone Number:       Current Level of Care: Hospital Recommended Level of Care: Skilled Nursing Facility Prior Approval Number:    Date Approved/Denied: 05/09/16 PASRR Number: [id number]  Discharge Plan: SNF    Current Diagnoses: Patient Active Problem List   Diagnosis Date Noted  . Sepsis (HCC) 06/01/2016  . Stable angina (HCC)   . Anemia   . Cellulitis   . Hypertensive urgency   . Right foot ulcer (HCC) 05/03/2016  . Diabetes mellitus type 2 with complications (HCC) 05/02/2016  . 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 Protein-calorie malnutrition, severe 03/28/2016  . Acute renal failure (ARF) (HCC) 03/27/2016  . Hypotension 03/27/2016  . SOB (shortness of breath) 02/15/2016  . CKD (chronic kidney disease), stage II 02/15/2016  . Coronary artery disease of native artery of native heart with stable angina pectoris (HCC) 01/17/2016  . Essential hypertension 01/17/2016  . HLD (hyperlipidemia) 01/17/2016  . ST elevation myocardial infarction involving left anterior descending (LAD) coronary artery (HCC)   . Cardiomyopathy, ischemic   . Acute respiratory failure (HCC) 01/13/2016  . NSTEMI (non-ST elevated myocardial infarction) (HCC)     Orientation RESPIRATION BLADDER Height & Weight     Self, Time, Situation, Place    Continent Weight: 137 lb 14.4 oz (62.6 kg) Height:  5\' 10"  (177.8 cm)  BEHAVIORAL SYMPTOMS/MOOD NEUROLOGICAL BOWEL NUTRITION STATUS      Continent Diet  (Cardiac diet)  AMBULATORY STATUS COMMUNICATION OF NEEDS Skin   Extensive Assist Verbally Normal                       Personal Care Assistance Level of Assistance  Bathing, Feeding, Dressing Bathing Assistance: Limited assistance Feeding assistance: Independent Dressing Assistance: Limited assistance     Functional Limitations Info    Sight Info: Adequate Hearing Info: Adequate Speech Info: Adequate    SPECIAL CARE FACTORS FREQUENCY        PT Frequency: Up to 5X a day, 5 days a week              Contractures Contractures Info: Present    Additional Factors Info    Code Status Info: Full Allergies Info: NKA           Current Medications (06/02/2016):  This is the current hospital active medication list Current Facility-Administered Medications  Medication Dose Route Frequency Provider Last Rate Last Dose  . acetaminophen (TYLENOL) tablet 650 mg  650 mg Oral Q6H PRN Adrian Saran, MD       Or  . acetaminophen (TYLENOL) suppository 650 mg  650 mg Rectal Q6H PRN Adrian Saran, MD      . aspirin EC tablet 81 mg  81 mg Oral Daily Adrian Saran, MD   81 mg at 06/02/16 0849  . atorvastatin (LIPITOR) tablet 80 mg  80 mg Oral q1800 Adrian Saran, MD   80 mg at 06/01/16 1634  . carvedilol (COREG) tablet 12.5 mg  12.5 mg Oral BID WC Sital Mody,  MD   12.5 mg at 06/02/16 0849  . enoxaparin (LOVENOX) injection 40 mg  40 mg Subcutaneous Q24H Adrian SaranSital Mody, MD   40 mg at 06/01/16 2321  . ferrous sulfate tablet 325 mg  325 mg Oral TID WC Adrian SaranSital Mody, MD   325 mg at 06/02/16 1200  . hydrALAZINE (APRESOLINE) injection 10 mg  10 mg Intravenous Q6H PRN Adrian SaranSital Mody, MD   10 mg at 06/01/16 1217  . hydrALAZINE (APRESOLINE) tablet 25 mg  25 mg Oral Q8H Ok Anishristopher R Berge, NP   25 mg at 06/02/16 1411  . HYDROcodone-acetaminophen (NORCO/VICODIN) 5-325 MG per tablet 1-2 tablet  1-2 tablet Oral Q4H PRN Adrian SaranSital Mody, MD      . insulin aspart (novoLOG) injection 0-5 Units  0-5 Units Subcutaneous QHS Sital  Mody, MD      . insulin aspart (novoLOG) injection 0-9 Units  0-9 Units Subcutaneous TID WC Adrian SaranSital Mody, MD   1 Units at 06/01/16 1700  . insulin glargine (LANTUS) injection 10 Units  10 Units Subcutaneous QHS Adrian SaranSital Mody, MD   10 Units at 06/01/16 2321  . lisinopril (PRINIVIL,ZESTRIL) tablet 40 mg  40 mg Oral Daily Adrian SaranSital Mody, MD   40 mg at 06/02/16 0849  . ondansetron (ZOFRAN) tablet 4 mg  4 mg Oral Q6H PRN Adrian SaranSital Mody, MD       Or  . ondansetron (ZOFRAN) injection 4 mg  4 mg Intravenous Q6H PRN Sital Mody, MD      . oxyCODONE-acetaminophen (PERCOCET/ROXICET) 5-325 MG per tablet 1 tablet  1 tablet Oral Q6H PRN Adrian SaranSital Mody, MD      . pantoprazole (PROTONIX) EC tablet 40 mg  40 mg Oral Daily Adrian SaranSital Mody, MD   40 mg at 06/02/16 0849  . piperacillin-tazobactam (ZOSYN) IVPB 3.375 g  3.375 g Intravenous Q8H Sital Mody, MD   3.375 g at 06/02/16 1411  . senna-docusate (Senokot-S) tablet 1 tablet  1 tablet Oral Daily Adrian SaranSital Mody, MD   1 tablet at 06/01/16 1634  . senna-docusate (Senokot-S) tablet 1 tablet  1 tablet Oral QHS PRN Adrian SaranSital Mody, MD      . sodium chloride flush (NS) 0.9 % injection 10-40 mL  10-40 mL Intracatheter Q12H Auburn BilberryShreyang Patel, MD   20 mL at 06/02/16 1000  . sodium chloride flush (NS) 0.9 % injection 10-40 mL  10-40 mL Intracatheter PRN Auburn BilberryShreyang Patel, MD         Discharge Medications: Please see discharge summary for a list of discharge medications.  Relevant Imaging Results:  Relevant Lab Results:   Additional Information SS# [id number]  Judi CongKaren M Maryelizabeth Eberle, LCSW

## 2016-06-02 NOTE — Progress Notes (Signed)
Inpatient Diabetes Program Recommendations  AACE/ADA: New Consensus Statement on Inpatient Glycemic Control (2015)  Target Ranges:  Prepandial:   less than 140 mg/dL      Peak postprandial:   less than 180 mg/dL (1-2 hours)      Critically ill patients:  140 - 180 mg/dL   Lab Results  Component Value Date   GLUCAP 79 06/02/2016   HGBA1C 14.2 (H) 05/03/2016    Review of Glycemic Control  Results for [name], Penny L (MRN [id number]) as of 06/02/2016 09:40  Ref. Range 06/01/2016 16:46 06/01/2016 20:57 06/02/2016 07:48  Glucose-Capillary Latest Ref Range: 65 - 99 mg/dL 829121 (H) 562141 (H) 79   Diabetes history: Type 2 Outpatient Diabetes medications: Lantus 10 units qhs, Metformin 500mg  bid  Current orders for Inpatient glycemic control: Lantus 10 units qhs, Novolog 0-9 units tid, Novolog 0-5 units qhs  Inpatient Diabetes Program Recommendations:  Patient seen by diabetes coordinator on 05/07/16 who discussed the elevated A1C (see note) with the patient.  Agree with current medications for blood sugar management.   Susette RacerJulie Jayci Ellefson, RN, BA, MHA, CDE Diabetes Coordinator Inpatient Diabetes Program  [phone] (Team Pager) [phone] [facility] Office) 06/02/2016 9:47 AM

## 2016-06-02 NOTE — Progress Notes (Signed)
Patient ID: Chaney BornJoel L Roam, male   DOB: 26-Jul-1950, 66 y.o.   MRN: [id number] X-rays right foot reviewed. Resection 5th metatarsal base with antibiotic beads intact in wound. No sign of additional osteolytic change as compared to previous x-ray outpatient. Appears stable.

## 2016-06-02 NOTE — Progress Notes (Signed)
[employer] [facility] [place] at [facility]                                                                                                                                                                                  Patient Demographics   Damon [name], is a 66 y.o. male, DOB - 1950/05/06, ZHY:[id number]  Admit date - 06/01/2016   Admitting Physician Adrian SaranSital Mody, MD  Outpatient Primary MD for the patient is Letta PateAYCOCK, [name], MD   LOS - 1  Subjective: Patient presented with fever and chest pain. He states that he has no further chest pain. Had low-grade temperature earlier this morning    Review of Systems:   CONSTITUTIONAL: No documented Positive fever. No fatigue, weakness. No weight gain, no weight loss.  EYES: No blurry or double vision.  ENT: No tinnitus. No postnasal drip. No redness of the oropharynx.  RESPIRATORY: No cough, no wheeze, no hemoptysis. No dyspnea.  CARDIOVASCULAR: No chest pain. No orthopnea. No palpitations. No syncope.  GASTROINTESTINAL: No nausea, no vomiting or diarrhea. No abdominal pain. No melena or hematochezia.  GENITOURINARY: No dysuria or hematuria.  ENDOCRINE: No polyuria or nocturia. No heat or cold intolerance.  HEMATOLOGY: No anemia. No bruising. No bleeding.  INTEGUMENTARY: No rashes. No lesions.  MUSCULOSKELETAL: No arthritis. No swelling. No gout.  NEUROLOGIC: No numbness, tingling, or ataxia. No seizure-type activity.  PSYCHIATRIC: No anxiety. No insomnia. No ADD.    Vitals:   Vitals:   06/01/16 1919 06/02/16 0528 06/02/16 0533 06/02/16 1300  BP: (!) 163/71  (!) 152/67 (!) 158/65  Pulse: (!) 102  100 100  Resp: 18  18 18   Temp: 99.8 F (37.7 C)  (!) 100.4 F (38 C) 99.2 F (37.3 C)  TempSrc: Oral  Oral Oral  SpO2: 97%  96% 98%  Weight:  137 lb 14.4 oz (62.6 kg)    Height:        Wt Readings from Last 3 Encounters:  06/02/16 137 lb 14.4 oz (62.6 kg)  05/03/16 160 lb (72.6 kg)  05/02/16 165 lb (74.8 kg)      Intake/Output Summary (Last 24 hours) at 06/02/16 1550 Last data filed at 06/02/16 0529  Gross per 24 hour  Intake               50 ml  Output              875 ml  Net             -825 ml    Physical Exam:   GENERAL: Pleasant-appearing in no apparent distress.  HEAD, EYES, EARS, NOSE AND THROAT: Atraumatic, normocephalic. Extraocular muscles are  intact. Pupils equal and reactive to light. Sclerae anicteric. No conjunctival injection. No oro-pharyngeal erythema.  NECK: Supple. There is no jugular venous distention. No bruits, no lymphadenopathy, no thyromegaly.  HEART: Regular rate and rhythm,. No murmurs, no rubs, no clicks.  LUNGS: Clear to auscultation bilaterally. No rales or rhonchi. No wheezes.  ABDOMEN: Soft, flat, nontender, nondistended. Has good bowel sounds. No hepatosplenomegaly appreciated.  EXTREMITIES: No evidence of any cyanosis, clubbing, or peripheral edema.  +2 pedal and radial pulses bilaterally. Right foot dressing in place NEUROLOGIC: The patient is alert, awake, and oriented x3 with no focal motor or sensory deficits appreciated bilaterally.  SKIN: Moist and warm with no rashes appreciated.  Psych: Not anxious, depressed LN: No inguinal LN enlargement    Antibiotics   Anti-infectives    Start     Dose/Rate Route Frequency Ordered Stop   06/01/16 1600  piperacillin-tazobactam (ZOSYN) IVPB 3.375 g     3.375 g 12.5 mL/hr over 240 Minutes Intravenous Every 8 hours 06/01/16 1549        Medications   Scheduled Meds: . aspirin EC  81 mg Oral Daily  . atorvastatin  80 mg Oral q1800  . carvedilol  12.5 mg Oral BID WC  . enoxaparin (LOVENOX) injection  40 mg Subcutaneous Q24H  . ferrous sulfate  325 mg Oral TID WC  . hydrALAZINE  25 mg Oral Q8H  . insulin aspart  0-5 Units Subcutaneous QHS  . insulin aspart  0-9 Units Subcutaneous TID WC  . insulin glargine  10 Units Subcutaneous QHS  . lisinopril  40 mg Oral Daily  . pantoprazole  40 mg Oral Daily   . piperacillin-tazobactam (ZOSYN)  IV  3.375 g Intravenous Q8H  . senna-docusate  1 tablet Oral Daily  . sodium chloride flush  10-40 mL Intracatheter Q12H   Continuous Infusions: PRN Meds:.acetaminophen **OR** acetaminophen, hydrALAZINE, HYDROcodone-acetaminophen, ondansetron **OR** ondansetron (ZOFRAN) IV, oxyCODONE-acetaminophen, senna-docusate, sodium chloride flush   Data Review:   Micro Results Recent Results (from the past 240 hour(s))  Culture, blood (Routine X 2) w Reflex to ID Panel     Status: None (Preliminary result)   Collection Time: 06/01/16  4:11 PM  Result Value Ref Range Status   Specimen Description BLOOD LEFT ASSIST CONTROL  Final   Special Requests   Final    BOTTLES DRAWN AEROBIC AND ANAEROBIC ANA11ML AER10ML   Culture NO GROWTH < 24 HOURS  Final   Report Status PENDING  Incomplete  Culture, blood (Routine X 2) w Reflex to ID Panel     Status: None (Preliminary result)   Collection Time: 06/01/16  4:12 PM  Result Value Ref Range Status   Specimen Description BLOOD ALINE  Final   Special Requests   Final    BOTTLES DRAWN AEROBIC AND ANAEROBIC ANA10ML AER10ML   Culture NO GROWTH < 24 HOURS  Final   Report Status PENDING  Incomplete  MRSA PCR Screening     Status: None   Collection Time: 06/01/16  4:13 PM  Result Value Ref Range Status   MRSA by PCR NEGATIVE NEGATIVE Final    Comment:        The GeneXpert MRSA Assay (FDA approved for NASAL specimens only), is one component of a comprehensive MRSA colonization surveillance program. It is not intended to diagnose MRSA infection nor to guide or monitor treatment for MRSA infections.     Radiology Reports Dg Chest 2 View  Result Date: 06/01/2016 CLINICAL DATA:  Chest pain EXAM:  CHEST  2 VIEW COMPARISON:  05/03/2016 FINDINGS: Right upper extremity PICC with tip at the SVC level. Stable borderline cardiomegaly. There is no edema, consolidation, effusion, or pneumothorax. Stable mid thoracic degenerative  disc narrowing and endplate sclerosis. IMPRESSION: 1. Stable exam.  No evidence of active disease. 2. Right upper extremity PICC in good position. Electronically Signed   By: Marnee Spring M.D.   On: 06/01/2016 10:06   Dg Chest 2 View  Result Date: 05/03/2016 CLINICAL DATA:  Shortness of breath with fever. EXAM: CHEST  2 VIEW COMPARISON:  04/27/2016 FINDINGS: The heart size and mediastinal contours are within normal limits. Both lungs are clear. The visualized skeletal structures are unremarkable. IMPRESSION: No active cardiopulmonary disease. Electronically Signed   By: Kennith Center M.D.   On: 05/03/2016 17:37   Mr Foot Right Wo Contrast  Result Date: 06/02/2016 CLINICAL DATA:  Patient with history of an ulceration on the lateral aspect of the right foot over the last few months. Status post resection of the proximal fifth metatarsal, debridement of necrotic tissue and placement of antibiotic impregnated beads 05/08/2016. Patient admitted to the hospital 06/01/2016 with fever. EXAM: MRI OF THE RIGHT FOREFOOT WITHOUT CONTRAST TECHNIQUE: Multiplanar, multisequence MR imaging of the right foot was performed. No intravenous contrast was administered. COMPARISON:  MRI right foot 05/08/2016.  Plain films this same day. FINDINGS: Surgical wound is seen over the lateral foot with underlying antibiotic impregnated beads in place. The proximal fifth metatarsal has been resected. No marrow edema is seen in the remnant of the fifth metatarsal. There is marrow edema throughout the cuboid and lateral cuneiform which is more extensive and intense than that seen on the prior MRI are. Marrow edema is also seen in the proximal 2 cm of the second and third metatarsals, markedly increased compared the prior MRI. Milder degree of marrow edema is seen in subchondral bone of the navicular and medial and middle cuneiforms and subchondral bone of the base of the first metatarsal. Edema throughout intrinsic musculature the foot  does not appear markedly changed compared to the prior MRI. Subcutaneous edema is seen about the ankle and foot. No focal fluid collection is seen. IMPRESSION: Marrow edema in the lateral cuneiform, cuboid and proximal second and third metatarsals is more extensive and intense than that seen on the prior MRI and worrisome for osteomyelitis. Milder degree of marrow edema mainly in subchondral bone of the proximal fourth metatarsal, navicular, medial and middle cuneiforms and first metatarsal also appears increased compared to the prior study and could be due to osteomyelitis or reactive change. Status post resection of the proximal fifth metatarsal with antibiotic impregnated beads in place. Signal in the remnant of the fifth metatarsal is normal. Large wound at the surgical site.  Negative for abscess. Edema throughout all intrinsic musculature the foot is unchanged and could be inflammatory or due to early atrophy. Electronically Signed   By: Drusilla Kanner M.D.   On: 06/02/2016 13:57   Mr Foot Right Wo Contrast  Result Date: 05/08/2016 CLINICAL DATA:  Open wound involving the base of the fifth metatarsal. Diabetic. EXAM: MRI OF THE RIGHT FOREFOOT WITHOUT CONTRAST TECHNIQUE: Multiplanar, multisequence MR imaging of the right foot was performed. No intravenous contrast was administered. COMPARISON:  Radiographs 05/03/2016 FINDINGS: Diffuse subcutaneous soft tissue swelling/edema/ fluid consistent with cellulitis. There is diffuse edema like signal abnormality in the foot musculature consistent with myofasciitis. Large open wound is noted on the lateral aspect of the foot in the region  of the base of the fifth metatarsal. There is some underlying fluid. There is also fluid tracking along the plantar aspect of the fourth metatarsal which could be an abscess. Difficult to be certain without contrast. T2 and STIR signal abnormality in the base of the fifth metatarsal and also along the lateral margin of the cuboid  suspicious for osteomyelitis. No definite MR findings for septic arthritis. IMPRESSION: 1. Large open wound along the lateral aspect of the midfoot in the region of the base of the fifth metatarsal. This appears to extend right down to the bone. 2. Diffuse cellulitis and myofasciitis. 3. Fluid collection tracking from the area of the open wound along the plantar aspect of the fourth metatarsal is worrisome for an abscess. 4. Findings suspicious for osteomyelitis involving the base of the fifth metatarsal and the lateral aspect of the cuboid. Electronically Signed   By: Rudie Meyer M.D.   On: 05/08/2016 11:10   Dg Foot Complete Right  Result Date: 06/02/2016 CLINICAL DATA:  Patient with history of an ulceration on the lateral aspect of the right foot over the last few months. Status post resection of the proximal fifth metatarsal, debridement of necrotic tissue and placement of antibiotic impregnated beads 05/08/2016. Patient admitted to the hospital 06/01/2016 with fever. EXAM: RIGHT FOOT COMPLETE - 3+ VIEW COMPARISON:  Plain films left foot 05/03/2016. MRI left ankle 05/08/2016. FINDINGS: Postoperative change of resection of the proximal fifth metatarsal and placement of antibiotic impregnated beads is identified. Soft tissues about the foot appear mildly swollen. Skin ulceration versus marked thinning of cutaneous tissues over the surgical site is identified. No soft tissue gas collection or bony destructive change. IMPRESSION: Status post resection of proximal fifth metatarsal and placement of antibiotic impregnated beads. No plain film evidence of osteomyelitis or other acute bony abnormality. Skin over the patient's surgical site appears markedly thinned. Electronically Signed   By: Drusilla Kanner M.D.   On: 06/02/2016 13:01   Dg Foot Complete Right  Result Date: 05/03/2016 CLINICAL DATA:  Assess right lateral foot ulceration. Current history of diabetes. Initial encounter. EXAM: RIGHT FOOT COMPLETE  - 3+ VIEW COMPARISON:  None. FINDINGS: There is no evidence of fracture or dislocation. There is no evidence of osseous erosion. The joint spaces are preserved. There is no evidence of talar subluxation; the subtalar joint is unremarkable in appearance. Small plantar and posterior calcaneal spurs are seen. The known soft tissue ulceration is not well characterized on radiograph. No radiopaque foreign bodies are seen. IMPRESSION: Known soft tissue ulceration not well characterized on radiograph. No radiopaque foreign bodies seen. No evidence of osseous erosion. Electronically Signed   By: Roanna Raider M.D.   On: 05/03/2016 17:42     CBC  Recent Labs Lab 06/01/16 1003 06/02/16 0229  WBC 5.9 6.2  HGB 9.3* 9.8*  HCT 26.5* 27.1*  PLT 327 323  MCV 90.2 89.8  MCH 31.7 32.5  MCHC 35.1 36.2*  RDW 16.7* 16.6*  LYMPHSABS 0.4*  --   MONOABS 0.6  --   EOSABS 0.1  --   BASOSABS 0.0  --     Chemistries   Recent Labs Lab 06/01/16 1003 06/02/16 0229  NA 138 137  K 3.5 3.2*  CL 108 106  CO2 22 22  GLUCOSE 135* 119*  BUN 12 16  CREATININE 1.08 1.25*  CALCIUM 8.4* 8.4*  AST 35  --   ALT 23  --   ALKPHOS 92  --   BILITOT 0.4  --    ------------------------------------------------------------------------------------------------------------------  estimated creatinine clearance is 52.9 mL/min (by C-G formula based on SCr of 1.25 mg/dL (H)). ------------------------------------------------------------------------------------------------------------------ No results for input(s): HGBA1C in the last 72 hours. ------------------------------------------------------------------------------------------------------------------ No results for input(s): CHOL, HDL, LDLCALC, TRIG, CHOLHDL, LDLDIRECT in the last 72 hours. ------------------------------------------------------------------------------------------------------------------ No results for input(s): TSH, T4TOTAL, T3FREE, THYROIDAB in the last  72 hours.  Invalid input(s): FREET3 ------------------------------------------------------------------------------------------------------------------ No results for input(s): VITAMINB12, FOLATE, FERRITIN, TIBC, IRON, RETICCTPCT in the last 72 hours.  Coagulation profile No results for input(s): INR, PROTIME in the last 168 hours.  No results for input(s): DDIMER in the last 72 hours.  Cardiac Enzymes  Recent Labs Lab 06/01/16 1424 06/01/16 2101 06/02/16 0229  TROPONINI 0.05* 0.07* 0.08*   ------------------------------------------------------------------------------------------------------------------ Invalid input(s): POCBNP    Assessment & Plan   66 year old male with history of peripheral vascular disease status post stenting and PTCA in July, CAD, can make kidney disease stage III and recent hospitalization for right foot nonhealing ulcer with osteomyelitis who presents with chest pain and sepsis.  1. Sepsis: Patient presents with fever and tachypnea Source could be related to his foot. I will order an MRI orthopedic consult pending Blood cultures no growth so far  2. Chest pain/pressure with history of CAD recent NSTEMI: Continue telemetry and monitoring of troponins Troponin minimally elevated patient currently asymptomatic [facility] cardiology consultation pending Continue atorvastatin, Coreg, NTG patch  3. Accelerated hypertension: Continue Coreg and add ACE inhibitor   4. Chronic kidney disease stage III with improved creatinine from baseline  5. Diabetes: Hold oral medications. Continue Lantus with sliding scale insulin Diabetes coordinator consultation  6.Tobacco dependence: Counseling done yest  7. Anemia of chronic disease: Patient has received PRBCs during last hospitalization. Continue to monitor CBC.  8. Right foot osteo: Podiatry and wound care consultation       Code Status Orders        Start     Ordered   06/01/16 1542  Full code   Continuous     06/01/16 1541    Code Status History    Date Active Date Inactive Code Status Order ID Comments User Context   05/03/2016 10:54 PM 05/11/2016  6:08 PM Full Code [id number]  Enedina Finner, MD Inpatient   03/27/2016  7:34 PM 03/29/2016  5:47 PM Full Code [id number]  Enedina Finner, MD Inpatient   02/14/2016  6:10 PM 02/15/2016  5:03 PM Full Code [id number]  Alford Highland, MD ED   01/13/2016  9:42 AM 01/17/2016 11:47 PM Full Code [id number]  Eugenie Norrie, NP ED           Consults  Podiatry and cardiology  DVT Prophylaxis  Lovenox   Lab Results  Component Value Date   PLT 323 06/02/2016     Time Spent in minutes   Greater than 50% of time spent in care coordination and counseling patient regarding the condition and plan of care.   Auburn Bilberry M.D on 06/02/2016 at 3:50 PM  Between 7am to 6pm - Pager - [phone]  After 6pm go to [email or web address] - password [email or web address] [facility]  [facility]   Office  [phone]

## 2016-06-02 NOTE — Progress Notes (Signed)
Progress Note  Patient Name: Damon [name] Date of Encounter: 06/02/2016  Primary Cardiologist: Kirke Corin  Subjective   Dyspnea much improved though still not quite back to baseline.  Inpatient Medications    Scheduled Meds: . aspirin EC  81 mg Oral Daily  . atorvastatin  80 mg Oral q1800  . carvedilol  12.5 mg Oral BID WC  . enoxaparin (LOVENOX) injection  40 mg Subcutaneous Q24H  . ferrous sulfate  325 mg Oral TID WC  . hydrALAZINE  25 mg Oral Q8H  . insulin aspart  0-5 Units Subcutaneous QHS  . insulin aspart  0-9 Units Subcutaneous TID WC  . insulin glargine  10 Units Subcutaneous QHS  . lisinopril  40 mg Oral Daily  . pantoprazole  40 mg Oral Daily  . piperacillin-tazobactam (ZOSYN)  IV  3.375 g Intravenous Q8H  . senna-docusate  1 tablet Oral Daily  . sodium chloride flush  10-40 mL Intracatheter Q12H   Continuous Infusions:  PRN Meds: acetaminophen **OR** acetaminophen, hydrALAZINE, HYDROcodone-acetaminophen, ondansetron **OR** ondansetron (ZOFRAN) IV, oxyCODONE-acetaminophen, senna-docusate, sodium chloride flush   Vital Signs    Vitals:   06/01/16 1800 06/01/16 1919 06/02/16 0528 06/02/16 0533  BP: (!) 149/67 (!) 163/71  (!) 152/67  Pulse: (!) 103 (!) 102  100  Resp:  18  18  Temp:  99.8 F (37.7 C)  (!) 100.4 F (38 C)  TempSrc:  Oral  Oral  SpO2:  97%  96%  Weight:   137 lb 14.4 oz (62.6 kg)   Height:        Intake/Output Summary (Last 24 hours) at 06/02/16 0746 Last data filed at 06/02/16 0529  Gross per 24 hour  Intake               50 ml  Output              875 ml  Net             -825 ml   Filed Weights   06/01/16 0934 06/02/16 0528  Weight: 158 lb (71.7 kg) 137 lb 14.4 oz (62.6 kg)    Telemetry    nsr at 84/min - Personally Reviewed   Physical Exam   GEN: No acute distress.   Neck: 6 cm JVD Cardiac: RRR with an S4, no murmurs, rubs, or gallops.  Respiratory: Clear to auscultation bilaterally. GI: Soft, nontender, non-distended    MS: No edema; No deformity. Right foot with an open wound on dorsal aspect down to bone Neuro:  Nonfocal but some diffuse slowing of thought and speech Psych: blunted affect   Labs    Chemistry Recent Labs Lab 06/01/16 1003 06/02/16 0229  NA 138 137  K 3.5 3.2*  CL 108 106  CO2 22 22  GLUCOSE 135* 119*  BUN 12 16  CREATININE 1.08 1.25*  CALCIUM 8.4* 8.4*  PROT 6.4*  --   ALBUMIN 2.6*  --   AST 35  --   ALT 23  --   ALKPHOS 92  --   BILITOT 0.4  --   GFRNONAA >60 59*  GFRAA >60 >60  ANIONGAP 8 9     Hematology Recent Labs Lab 06/01/16 1003 06/02/16 0229  WBC 5.9 6.2  RBC 2.94* 3.02*  HGB 9.3* 9.8*  HCT 26.5* 27.1*  MCV 90.2 89.8  MCH 31.7 32.5  MCHC 35.1 36.2*  RDW 16.7* 16.6*  PLT 327 323    Cardiac Enzymes Recent Labs Lab 06/01/16 1003  06/01/16 1424 06/01/16 2101 06/02/16 0229  TROPONINI 0.05* 0.05* 0.07* 0.08*   No results for input(s): TROPIPOC in the last 168 hours.   BNPNo results for input(s): BNP, PROBNP in the last 168 hours.   DDimer No results for input(s): DDIMER in the last 168 hours.   Radiology    Dg Chest 2 View  Result Date: 06/01/2016 CLINICAL DATA:  Chest pain EXAM: CHEST  2 VIEW COMPARISON:  05/03/2016 FINDINGS: Right upper extremity PICC with tip at the SVC level. Stable borderline cardiomegaly. There is no edema, consolidation, effusion, or pneumothorax. Stable mid thoracic degenerative disc narrowing and endplate sclerosis. IMPRESSION: 1. Stable exam.  No evidence of active disease. 2. Right upper extremity PICC in good position. Electronically Signed   By: Marnee SpringJonathon  Watts M.D.   On: 06/01/2016 10:06     Patient Profile     66 year old male with a prior history of CAD status post myocardial infarction with LAD and obtuse marginal stenting in October 2017 in the setting of a new left bundle branch block, ischemic cardiomyopathy with an EF of 30-35%, peripheral arterial disease status post bilateral iliac, right popliteal,  right SFA, and right peroneal interventions, hypertension, hyperlipidemia, diabetes mellitus, and stage III chronic kidney disease who was admitted Feb 23rd with chest pain, hypertensive urgency, and fever.   Assessment & Plan    1  CAD/chest pain.   Hx MI in 2017 with OM stenting  Now in setting of chpertenisve urgency  Minimal elevation in trop.  Probable demand ischemia. I would not work up further as an inpatient.  2  HTN with CHF and hypertensive urgency - his blood pressure is much better. I wonder if the patient has some compliance problems.  3  Ischemic cardiomyopathy  LVEF 30 to 35%  Medications adjusted Will nee f/u echo as outpt.  Possible ICD in future although his peripheral artery disease makes him a suboptimal candidate.  4  HL  LDL 58 November  5  PAD  REcent Intervention in R Leg - he has a large residual wound on the dorsum of the right foot.  6  CKD  Stage III - his creatinine has increased since yesterday. Watch carefully. May need to adjust diuretic.   Signed, Lewayne BuntingGregg Sharis Keeran, MD  06/02/2016, 7:46 AM

## 2016-06-02 NOTE — Progress Notes (Signed)
Physical Therapy Evaluation Patient Details Name: Damon [name] MRN: [id number] DOB: 07-25-1950 Today's Date: 06/02/2016   History of Present Illness  Patient is a 66 y.o. male admitted on 23 FEB with chest pain and fever. 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 PMH includes CKDIII, CAD, and PAD.  Clinical Impression  Patient is a pleasant male admitted for above listed reasons. Upon evaluation, patient's cognition was difficult to assess, as no one was present to clarify baseline, and he sometimes required increased time to answer questions verbally/utilizing hand signals. Patient demonstrated need for moderate-maximal assistance to sit at EOB statically and to return to supine. Transfer/gait assessment deferred due to decreased static sitting balance/muscle weakness. Patient will continue to benefit from progressive PT to return to PLOF, but he will likely be in need of f/u services at [facility] upon discharge when medically ready.    Follow Up Recommendations SNF    Equipment Recommendations  None recommended by PT    Recommendations for Other Services OT consult     Precautions / Restrictions Precautions Precautions: Fall Restrictions Weight Bearing Restrictions: Yes RLE Weight Bearing: Non weight bearing      Mobility  Bed Mobility Overal bed mobility: Needs Assistance Bed Mobility: Supine to Sit;Sit to Supine     Supine to sit: Mod assist Sit to supine: Max assist   General bed mobility comments: Patient required moderate assistance to move to sitting and required moderate assistance to maintain static balance, leaning to R. Required maximal assistance into supine to control descent into bed and to scoot to [facility].  Transfers                 General transfer comment: Transfer assessment deferred due to decreased static sitting balance.  Ambulation/Gait                Stairs            Wheelchair  Mobility    Modified Rankin (Stroke Patients Only)       Balance Overall balance assessment: Needs assistance Sitting-balance support: Bilateral upper extremity supported Sitting balance-Leahy Scale: Poor Sitting balance - Comments: Leaning posterolaterally to R                                     Pertinent Vitals/Pain Pain Assessment: No/denies pain    Home Living Family/patient expects to be discharged to:: Private residence Living Arrangements: Spouse/significant other Available Help at Discharge: Family;Available PRN/intermittently Type of Home: House Home Access: Stairs to enter Entrance Stairs-Rails: None Entrance Stairs-Number of Steps: 6 in front, 2 in back Home Layout: One level Home Equipment: Walker - 2 wheels      Prior Function Level of Independence: Needs assistance   Gait / Transfers Assistance Needed: Patient modified independent with household distances and RW  ADL's / Homemaking Assistance Needed: Performed by family; family drives he and wife to appointments as needed        Hand Dominance        Extremity/Trunk Assessment   Upper Extremity Assessment Upper Extremity Assessment: Generalized weakness    Lower Extremity Assessment Lower Extremity Assessment: Generalized weakness;RLE deficits/detail RLE Deficits / Details: Foot bandaged; unable to fully assess       Communication   Communication: No difficulties  Cognition Arousal/Alertness: Lethargic Behavior During Therapy: Flat affect;WFL for tasks assessed/performed Overall Cognitive Status: Within Functional Limits  for tasks assessed                 General Comments: Patient sometimes delayed in answering questions    General Comments      Exercises     Assessment/Plan    PT Assessment Patient needs continued PT services  PT Problem List Decreased strength;Decreased activity tolerance;Decreased balance;Decreased mobility;Decreased knowledge of use of  DME;Decreased safety awareness;Decreased knowledge of precautions       PT Treatment Interventions DME instruction;Gait training;Stair training;Functional mobility training;Therapeutic activities;Therapeutic exercise;Balance training;Cognitive remediation;Patient/family education    PT Goals (Current goals can be found in the Care Plan section)  Acute Rehab PT Goals Patient Stated Goal: "To feel better" PT Goal Formulation: With patient Time For Goal Achievement: 06/16/16 Potential to Achieve Goals: Good    Frequency Min 2X/week   Barriers to discharge Inaccessible home environment;Decreased caregiver support      Co-evaluation               End of Session   Activity Tolerance: Patient limited by fatigue Patient left: in bed;with call bell/phone within reach;with bed alarm set Nurse Communication: Mobility status PT Visit Diagnosis: Muscle weakness (generalized) (M62.81);Difficulty in walking, not elsewhere classified (R26.2)         Time: [id number] PT Time Calculation (min) (ACUTE ONLY): 19 min   Charges:   PT Evaluation $PT Eval Low Complexity: 1 Procedure     PT G Codes:         Neita CarpJulie Ann Vonn Sliger, PT, DPT 06/02/2016, 9:50 AM

## 2016-06-02 NOTE — Clinical Social Work Note (Signed)
Clinical Social Work Assessment  Patient Details  Name: Damon [name] MRN: [id number] Date of Birth: [birth year]  Date of referral:  06/02/16               Reason for consult:  Facility Placement                Permission sought to share information with:  Chartered certified accountant granted to share information::  Yes, Verbal Permission Granted  Name::        Agency::     Relationship::     Contact Information:     Housing/Transportation Living arrangements for the past 2 months:  Single Family Home Source of Information:  Patient Patient Interpreter Needed:  None Criminal Activity/Legal Involvement Pertinent to Current Situation/Hospitalization:  No - Comment as needed Significant Relationships:  Spouse Lives with:  Spouse Do you feel safe going back to the place where you live?  Yes Need for family participation in patient care:  No (Coment)  Care giving concerns:  Patient admitted from [facility].   Social Worker assessment / plan:  CSW met with patient at bedside to discuss dc planning. The patient verbalized that he is willing to return to [facility] once stable, but that he would prefer to return home. CSW contacted Broadus John at [facility] to confirm, and Broadus John reported that the patient can return.  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  Patient/Family's Understanding of and Emotional Response to Diagnosis, Current Treatment, and Prognosis:  Patient is in agreement with dc plan.  Emotional Assessment Appearance:  Appears stated age Attitude/Demeanor/Rapport:  Lethargic Affect (typically observed):  Accepting, Pleasant Orientation:  Oriented to Self, Oriented to Place, Oriented to  Time, Oriented to Situation Alcohol / Substance use:  Never Used Psych involvement  (Current and /or in the community):  No (Comment)  Discharge Needs  Concerns to be addressed:  Care Coordination Readmission within the last 30 days:  No Current discharge risk:  None Barriers to Discharge:  Continued Medical Work up   [employer], LCSW 06/02/2016, 2:55 PM

## 2016-06-02 NOTE — Consult Note (Signed)
Reason for Consult: Chronic wound right foot status post debridement Referring Physician: Dr. Ruthell Rummage is an 66 y.o. male.  HPI: The patient has a history of ulceration on the lateral aspect of his right foot over the last few months. He underwent revascularization a few weeks ago with debridement of all of the necrotic tissue. Following debridement there was significant skin loss with exposed bone. This was covered with antibiotic beads and a Mepitel dressing. Recently admitted with chest pain and fevers.  Past Medical History:  Diagnosis Date  . CAD (coronary artery disease)    a. 01/2016 MI/PCI: LM nl, LAD 192md (3.25 x 28 Xience DES), 40d, LCX 30ost, OM1 95 (staged - 2.75 x 18 Xience Alpine DES), OM2/3 min irregs, RCA 839m . Chronic combined systolic and diastolic CHF (congestive heart failure) ([place]   a. 01/2016 Echo: Ef 25-30%, Gr1 DD, mild AI, mildly dil LA;  b. 02/2016 Echo: EF 30-35%, apical AK, antsept and ant HK, nl RV fxn.  . CKD (chronic kidney disease), stage III   . COPD (chronic obstructive pulmonary disease) ([facility]  . Diabetes mellitus without complication ([place]  . Diverticulitis   . Essential hypertension   . Ischemic cardiomyopathy    a. 01/2016 Echo: Ef 25-30%;  b. 02/2016 Echo: EF 30-35%.  . Marland KitchenBBB (left bundle branch block)   . P[place]peripheral artery disease) ([place]   a. 10/2015 s/p PTA and stenting of the RCIA, LCIA, and PTA of the LEIA (Schnier); b. 04/2016 PTA or R tibioperoneal trunk & prox PT, PTA/DBA of R Pop and SFA, Viabahn covered stent x 2 to the R SFA and pop (36m64m 25 cm & 36mm48m15cm).  . Tobacco abuse     Past Surgical History:  Procedure Laterality Date  . CARDIAC CATHETERIZATION N/A 01/13/2016   Procedure: LEFT HEART CATH AND CORONARY ANGIOGRAPHY;  Surgeon: MuhaWellington Hampshire;  [facility];  Service: Cardiovascular;  Laterality: N/A;  . CARDIAC CATHETERIZATION N/A 01/13/2016   Procedure: Coronary Stent Intervention;   Surgeon: MuhaWellington Hampshire;  [facility];  Service: Cardiovascular;  Laterality: N/A;  . CARDIAC CATHETERIZATION N/A 01/16/2016   Procedure: Left Heart Cath and Coronary Angiography;  Surgeon: MuhaWellington Hampshire;  [facility];  Service: Cardiovascular;  Laterality: N/A;  . IRRIGATION AND DEBRIDEMENT FOOT Right 05/08/2016   Procedure: IRRIGATION AND DEBRIDEMENT FOOT WITH PLACEMENT OF ANTIBIOTIC BEADS;  Surgeon: ToddSharlotte AlamoM;  [facility];  Service: Podiatry;  Laterality: Right;  . NECK SURGERY    . PERIPHERAL VASCULAR CATHETERIZATION Left 11/01/2015   Procedure: Lower Extremity Angiography;  Surgeon: GregKatha Cabal;  [facility];  Service: Cardiovascular;  Laterality: Left;  . PERIPHERAL VASCULAR CATHETERIZATION Right 05/07/2016   Procedure: Lower Extremity Angiography;  Surgeon: JasoAlgernon Huxley;  [facility];  Service: Cardiovascular;  Laterality: Right;    Family History  Problem Relation Age of Onset  . Intracerebral hemorrhage Mother   . Diabetes Mother   . Cancer Mother   . Diabetes Father     Social History:  reports that he has been smoking Cigarettes.  He has a 7.50 pack-year smoking history. He uses smokeless tobacco. He reports that he does not drink alcohol or use drugs.  Allergies: No Known Allergies  Medications:  Scheduled: . aspirin EC  81 mg Oral Daily  . atorvastatin  80 mg Oral q1800  . carvedilol  12.5 mg Oral BID WC  . enoxaparin (LOVENOX) injection  40 mg Subcutaneous Q24H  . ferrous sulfate  325 mg Oral TID WC  . hydrALAZINE  25 mg Oral Q8H  . insulin aspart  0-5 Units Subcutaneous QHS  . insulin aspart  0-9 Units Subcutaneous TID WC  . insulin glargine  10 Units Subcutaneous QHS  . lisinopril  40 mg Oral Daily  . pantoprazole  40 mg Oral Daily  . piperacillin-tazobactam (ZOSYN)  IV  3.375 g Intravenous Q8H  . potassium chloride  20 mEq Oral Once  . senna-docusate   1 tablet Oral Daily  . sodium chloride flush  10-40 mL Intracatheter Q12H    Results for orders placed or performed during the hospital encounter of 06/01/16 (from the past 48 hour(s))  CBC with Differential/Platelet     Status: Abnormal   Collection Time: 06/01/16 10:03 AM  Result Value Ref Range   WBC 5.9 3.8 - 10.6 K/uL   RBC 2.94 (L) 4.40 - 5.90 MIL/uL   Hemoglobin 9.3 (L) 13.0 - 18.0 g/dL   HCT 26.5 (L) 40.0 - 52.0 %   MCV 90.2 80.0 - 100.0 fL   MCH 31.7 26.0 - 34.0 pg   MCHC 35.1 32.0 - 36.0 g/dL   RDW 16.7 (H) 11.5 - 14.5 %   Platelets 327 150 - 440 K/uL   Neutrophils Relative % 81 %   Neutro Abs 4.8 1.4 - 6.5 K/uL   Lymphocytes Relative 7 %   Lymphs Abs 0.4 (L) 1.0 - 3.6 K/uL   Monocytes Relative 10 %   Monocytes Absolute 0.6 0.2 - 1.0 K/uL   Eosinophils Relative 1 %   Eosinophils Absolute 0.1 0 - 0.7 K/uL   Basophils Relative 1 %   Basophils Absolute 0.0 0 - 0.1 K/uL  Comprehensive metabolic panel     Status: Abnormal   Collection Time: 06/01/16 10:03 AM  Result Value Ref Range   Sodium 138 135 - 145 mmol/L   Potassium 3.5 3.5 - 5.1 mmol/L   Chloride 108 101 - 111 mmol/L   CO2 22 22 - 32 mmol/L   Glucose, Bld 135 (H) 65 - 99 mg/dL   BUN 12 6 - 20 mg/dL   Creatinine, Ser 1.08 0.61 - 1.24 mg/dL   Calcium 8.4 (L) 8.9 - 10.3 mg/dL   Total Protein 6.4 (L) 6.5 - 8.1 g/dL   Albumin 2.6 (L) 3.5 - 5.0 g/dL   AST 35 15 - 41 U/L   ALT 23 17 - 63 U/L   Alkaline Phosphatase 92 38 - 126 U/L   Total Bilirubin 0.4 0.3 - 1.2 mg/dL   GFR calc non Af Amer >60 >60 mL/min   GFR calc Af Amer >60 >60 mL/min    Comment: (NOTE) The eGFR has been calculated using the CKD EPI equation. This calculation has not been validated in all clinical situations. eGFR's persistently <60 mL/min signify possible Chronic Kidney Disease.    Anion gap 8 5 - 15  Troponin I     Status: Abnormal   Collection Time: 06/01/16 10:03 AM  Result Value Ref Range   Troponin I 0.05 (HH) <0.03 ng/mL     Comment: CRITICAL RESULT CALLED TO, READ BACK BY AND VERIFIED WITH KIM MAIN @ 1043 06/01/16 BY [facility]   Troponin I     Status: Abnormal   Collection Time: 06/01/16  2:24 PM  Result Value Ref Range  Troponin I 0.05 (HH) <0.03 ng/mL    Comment: CRITICAL VALUE NOTED. VALUE IS CONSISTENT WITH PREVIOUSLY REPORTED/CALLED VALUE. [place].  Culture, blood (Routine X 2) w Reflex to ID Panel     Status: None (Preliminary result)   Collection Time: 06/01/16  4:11 PM  Result Value Ref Range   Specimen Description BLOOD LEFT ASSIST CONTROL    Special Requests      BOTTLES DRAWN AEROBIC AND ANAEROBIC ANA11ML AER10ML   Culture NO GROWTH < 24 HOURS    Report Status PENDING   HIV antibody (Routine Testing)     Status: None   Collection Time: 06/01/16  4:11 PM  Result Value Ref Range   HIV Screen 4th Generation wRfx Non Reactive Non Reactive    Comment: (NOTE) Performed At: [facility] [place], [place] [id number] Lindon Romp MD ER:[id number]   Culture, blood (Routine X 2) w Reflex to ID Panel     Status: None (Preliminary result)   Collection Time: 06/01/16  4:12 PM  Result Value Ref Range   Specimen Description BLOOD ALINE    Special Requests      BOTTLES DRAWN AEROBIC AND ANAEROBIC ANA10ML AER10ML   Culture NO GROWTH < 24 HOURS    Report Status PENDING   MRSA PCR Screening     Status: None   Collection Time: 06/01/16  4:13 PM  Result Value Ref Range   MRSA by PCR NEGATIVE NEGATIVE    Comment:        The GeneXpert MRSA Assay (FDA approved for NASAL specimens only), is one component of a comprehensive MRSA colonization surveillance program. It is not intended to diagnose MRSA infection nor to guide or monitor treatment for MRSA infections.   Glucose, capillary     Status: Abnormal   Collection Time: 06/01/16  4:46 PM  Result Value Ref Range   Glucose-Capillary 121 (H) 65 - 99 mg/dL  Glucose, capillary     Status: Abnormal   Collection Time: 06/01/16  8:57 PM   Result Value Ref Range   Glucose-Capillary 141 (H) 65 - 99 mg/dL  Troponin I     Status: Abnormal   Collection Time: 06/01/16  9:01 PM  Result Value Ref Range   Troponin I 0.07 (HH) <0.03 ng/mL    Comment: CRITICAL VALUE NOTED. VALUE IS CONSISTENT WITH PREVIOUSLY REPORTED/CALLED VALUE.PMH  Urinalysis, Routine w reflex microscopic     Status: Abnormal   Collection Time: 06/01/16  9:19 PM  Result Value Ref Range   Color, Urine COLORLESS (A) YELLOW   APPearance CLEAR (A) CLEAR   Specific Gravity, Urine 1.005 1.005 - 1.030   pH 5.0 5.0 - 8.0   Glucose, UA NEGATIVE NEGATIVE mg/dL   Hgb urine dipstick NEGATIVE NEGATIVE   Bilirubin Urine NEGATIVE NEGATIVE   Ketones, ur NEGATIVE NEGATIVE mg/dL   Protein, ur NEGATIVE NEGATIVE mg/dL   Nitrite NEGATIVE NEGATIVE   Leukocytes, UA NEGATIVE NEGATIVE  Troponin I     Status: Abnormal   Collection Time: 06/02/16  2:29 AM  Result Value Ref Range   Troponin I 0.08 (HH) <0.03 ng/mL    Comment: CRITICAL VALUE NOTED. VALUE IS CONSISTENT WITH PREVIOUSLY REPORTED/CALLED VALUE.PMH  Basic metabolic panel     Status: Abnormal   Collection Time: 06/02/16  2:29 AM  Result Value Ref Range   Sodium 137 135 - 145 mmol/L   Potassium 3.2 (L) 3.5 - 5.1 mmol/L   Chloride 106 101 - 111 mmol/L   CO2  22 22 - 32 mmol/L   Glucose, Bld 119 (H) 65 - 99 mg/dL   BUN 16 6 - 20 mg/dL   Creatinine, Ser 1.25 (H) 0.61 - 1.24 mg/dL   Calcium 8.4 (L) 8.9 - 10.3 mg/dL   GFR calc non Af Amer 59 (L) >60 mL/min   GFR calc Af Amer >60 >60 mL/min    Comment: (NOTE) The eGFR has been calculated using the CKD EPI equation. This calculation has not been validated in all clinical situations. eGFR's persistently <60 mL/min signify possible Chronic Kidney Disease.    Anion gap 9 5 - 15  CBC     Status: Abnormal   Collection Time: 06/02/16  2:29 AM  Result Value Ref Range   WBC 6.2 3.8 - 10.6 K/uL   RBC 3.02 (L) 4.40 - 5.90 MIL/uL   Hemoglobin 9.8 (L) 13.0 - 18.0 g/dL   HCT  27.1 (L) 40.0 - 52.0 %   MCV 89.8 80.0 - 100.0 fL   MCH 32.5 26.0 - 34.0 pg   MCHC 36.2 (H) 32.0 - 36.0 g/dL   RDW 16.6 (H) 11.5 - 14.5 %   Platelets 323 150 - 440 K/uL  Glucose, capillary     Status: None   Collection Time: 06/02/16  7:48 AM  Result Value Ref Range   Glucose-Capillary 79 65 - 99 mg/dL    Dg Chest 2 View  Result Date: 06/01/2016 CLINICAL DATA:  Chest pain EXAM: CHEST  2 VIEW COMPARISON:  05/03/2016 FINDINGS: Right upper extremity PICC with tip at the SVC level. Stable borderline cardiomegaly. There is no edema, consolidation, effusion, or pneumothorax. Stable mid thoracic degenerative disc narrowing and endplate sclerosis. IMPRESSION: 1. Stable exam.  No evidence of active disease. 2. Right upper extremity PICC in good position. Electronically Signed   By: Monte Fantasia M.D.   On: 06/01/2016 10:06    Review of Systems  Constitutional: Positive for fever. Negative for chills.  HENT: Negative.   Eyes: Negative.   Respiratory: Positive for cough.        Relates a dry hacking cough.  Cardiovascular: Negative.   Gastrointestinal: Negative for nausea and vomiting.  Genitourinary: Negative.   Musculoskeletal:       Denies any pain with the foot  Skin:       Wound on the right foot. Recent debridement of gangrenous changes  Neurological:       Numbness in the feet related to his diabetes  Endo/Heme/Allergies: Negative.   Psychiatric/Behavioral: Negative.    Blood pressure (!) 152/67, pulse 100, temperature (!) 100.4 F (38 C), temperature source Oral, resp. rate 18, height '5\' 10"'  (1.778 m), weight 62.6 kg (137 lb 14.4 oz), SpO2 96 %. Physical Exam  Cardiovascular:  DP pulses trace on the right. PT pulse weak 1 over 4.  Musculoskeletal:  Adequate range of motion. Muscle testing deferred  Neurological:  Loss of sensation distally in the feet.  Skin:  Full-thickness wound on the lateral aspect of the right foot covered with Mepitel and staples. Upon removal  antibiotic beads are still intact and embedded in some of the granulation and tissue. Still evidence of exposed bone at the base of the fourth metatarsal and cuboid bone. Only mild drainage on the bandaging with no overt purulence. Some signs of bleeding upon light probing with a cotton-tipped applicator.    Assessment/Plan: Assessment: 1. Gangrenous changes with osteomyelitis right midfoot status post debridement. 2. Diabetes with associated neuropathy. 3. Peripheral vascular disease.  Plan:  Mepitel and staples were removed today. Overall the wound appears relatively stable. Sterile saline wet-to-dry gauze dressing applied over the wound. We will begin wet-to-dry dressing changes every other day. Obtain x-ray for reevaluation of bone structure. Discussed with the patient that he is still at significant risk for loss of his limb. At this point the foot looks stable enough that I do not believe he needs a repeat debridement at this time.  Durward Fortes 06/02/2016, 10:15 AM

## 2016-06-03 DIAGNOSIS — M869 Osteomyelitis, unspecified: Secondary | ICD-10-CM

## 2016-06-03 DIAGNOSIS — E1169 Type 2 diabetes mellitus with other specified complication: Secondary | ICD-10-CM

## 2016-06-03 LAB — CBC
HCT: 27.6 % — ABNORMAL LOW (ref 40.0–52.0)
HEMOGLOBIN: 9.6 g/dL — AB (ref 13.0–18.0)
MCH: 31.3 pg (ref 26.0–34.0)
MCHC: 34.7 g/dL (ref 32.0–36.0)
MCV: 90 fL (ref 80.0–100.0)
Platelets: 324 10*3/uL (ref 150–440)
RBC: 3.07 MIL/uL — AB (ref 4.40–5.90)
RDW: 16.4 % — ABNORMAL HIGH (ref 11.5–14.5)
WBC: 7.8 10*3/uL (ref 3.8–10.6)

## 2016-06-03 LAB — BASIC METABOLIC PANEL
ANION GAP: 8 (ref 5–15)
BUN: 20 mg/dL (ref 6–20)
CHLORIDE: 108 mmol/L (ref 101–111)
CO2: 23 mmol/L (ref 22–32)
Calcium: 8.2 mg/dL — ABNORMAL LOW (ref 8.9–10.3)
Creatinine, Ser: 1.25 mg/dL — ABNORMAL HIGH (ref 0.61–1.24)
GFR calc Af Amer: >60 mL/min (ref >60)
GFR calc non Af Amer: 59 mL/min — ABNORMAL LOW (ref >60)
Glucose, Bld: 53 mg/dL — ABNORMAL LOW (ref 65–99)
POTASSIUM: 3.1 mmol/L — AB (ref 3.5–5.1)
SODIUM: 139 mmol/L (ref 135–145)

## 2016-06-03 LAB — GLUCOSE, CAPILLARY
GLUCOSE-CAPILLARY: 45 mg/dL — AB (ref 65–99)
GLUCOSE-CAPILLARY: 123 mg/dL — AB (ref 65–99)
GLUCOSE-CAPILLARY: 138 mg/dL — AB (ref 65–99)
GLUCOSE-CAPILLARY: 143 mg/dL — AB (ref 65–99)
Glucose-Capillary: 82 mg/dL (ref 65–99)

## 2016-06-03 MED ORDER — GLUCERNA SHAKE PO LIQD
237.0000 mL | Freq: Three times a day (TID) | ORAL | Status: DC
  Administered 2016-06-03 – 2016-06-11 (×23): 237 mL via ORAL

## 2016-06-03 MED ORDER — POTASSIUM CHLORIDE CRYS ER 20 MEQ PO TBCR
40.0000 meq | EXTENDED_RELEASE_TABLET | Freq: Once | ORAL | Status: AC
  Administered 2016-06-03: 40 meq via ORAL
  Filled 2016-06-03: qty 2

## 2016-06-03 NOTE — Progress Notes (Signed)
Hypoglycemic Event  CBG: 45  Treatment: 4oz juice given  Symptoms: patient asymptomatic  Follow-up CBG: Time: 0822 CBG Result: 82  Possible Reasons for Event: lantus? Comments/MD notified: MD notified and lantus adjusted    Chuck Caban D Darnita Woodrum

## 2016-06-03 NOTE — Progress Notes (Signed)
[facility] - [place] [facility]                                                                                                                                                                                  Patient Demographics   Damon [name], is a 66 y.o. male, DOB - January 23, 1951, UJW:[id number]  Admit date - 06/01/2016   Admitting Physician Adrian SaranSital Mody, MD  Outpatient Primary MD for the patient is [name], [name], MD   LOS - 2  Subjective:  pt has no complaints, fever resolved, no cp Mri results noted  Review of Systems:   CONSTITUTIONAL: No documented   fever. No fatigue, weakness. No weight gain, no weight loss.  EYES: No blurry or double vision.  ENT: No tinnitus. No postnasal drip. No redness of the oropharynx.  RESPIRATORY: No cough, no wheeze, no hemoptysis. No dyspnea.  CARDIOVASCULAR: No chest pain. No orthopnea. No palpitations. No syncope.  GASTROINTESTINAL: No nausea, no vomiting or diarrhea. No abdominal pain. No melena or hematochezia.  GENITOURINARY: No dysuria or hematuria.  ENDOCRINE: No polyuria or nocturia. No heat or cold intolerance.  HEMATOLOGY: No anemia. No bruising. No bleeding.  INTEGUMENTARY: No rashes. No lesions.  MUSCULOSKELETAL: No arthritis. No swelling. No gout.  NEUROLOGIC: No numbness, tingling, or ataxia. No seizure-type activity.  PSYCHIATRIC: No anxiety. No insomnia. No ADD.    Vitals:   Vitals:   06/02/16 1923 06/03/16 0525 06/03/16 0824 06/03/16 1153  BP: 117/60 (!) 151/61 (!) 133/57 136/63  Pulse: 71 71 70 75  Resp: 18 18 16 18   Temp: 98.7 F (37.1 C) 98.4 F (36.9 C) 97.4 F (36.3 C) 99.5 F (37.5 C)  TempSrc: Oral Oral Oral Oral  SpO2: 98% 98% 99% 99%  Weight:      Height:        Wt Readings from Last 3 Encounters:  06/02/16 137 lb 14.4 oz (62.6 kg)  05/03/16 160 lb (72.6 kg)  05/02/16 165 lb (74.8 kg)     Intake/Output Summary (Last 24 hours) at 06/03/16 1321 Last data filed at 06/03/16 0957  Gross  per 24 hour  Intake              240 ml  Output              450 ml  Net             -210 ml    Physical Exam:   GENERAL: Pleasant-appearing in no apparent distress.  HEAD, EYES, EARS, NOSE AND THROAT: Atraumatic, normocephalic. Extraocular muscles are intact. Pupils equal and reactive to light. Sclerae anicteric. No conjunctival injection. No oro-pharyngeal erythema.  NECK:  Supple. There is no jugular venous distention. No bruits, no lymphadenopathy, no thyromegaly.  HEART: Regular rate and rhythm,. No murmurs, no rubs, no clicks.  LUNGS: Clear to auscultation bilaterally. No rales or rhonchi. No wheezes.  ABDOMEN: Soft, flat, nontender, nondistended. Has good bowel sounds. No hepatosplenomegaly appreciated.  EXTREMITIES: No evidence of any cyanosis, clubbing, or peripheral edema.  +2 pedal and radial pulses bilaterally. Right foot dressing in place NEUROLOGIC: The patient is alert, awake, and oriented x3 with no focal motor or sensory deficits appreciated bilaterally.  SKIN: Moist and warm with no rashes appreciated.  Psych: Not anxious, depressed LN: No inguinal LN enlargement    Antibiotics   Anti-infectives    Start     Dose/Rate Route Frequency Ordered Stop   06/01/16 1600  piperacillin-tazobactam (ZOSYN) IVPB 3.375 g     3.375 g 12.5 mL/hr over 240 Minutes Intravenous Every 8 hours 06/01/16 1549        Medications   Scheduled Meds: . aspirin EC  81 mg Oral Daily  . atorvastatin  80 mg Oral q1800  . carvedilol  12.5 mg Oral BID WC  . enoxaparin (LOVENOX) injection  40 mg Subcutaneous Q24H  . feeding supplement (GLUCERNA SHAKE)  237 mL Oral TID BM  . ferrous sulfate  325 mg Oral TID WC  . hydrALAZINE  25 mg Oral Q8H  . insulin aspart  0-5 Units Subcutaneous QHS  . insulin aspart  0-9 Units Subcutaneous TID WC  . lisinopril  40 mg Oral Daily  . pantoprazole  40 mg Oral Daily  . piperacillin-tazobactam (ZOSYN)  IV  3.375 g Intravenous Q8H  . senna-docusate  1 tablet  Oral Daily  . sodium chloride flush  10-40 mL Intracatheter Q12H   Continuous Infusions: PRN Meds:.acetaminophen **OR** acetaminophen, hydrALAZINE, HYDROcodone-acetaminophen, ondansetron **OR** ondansetron (ZOFRAN) IV, oxyCODONE-acetaminophen, senna-docusate, sodium chloride flush   Data Review:   Micro Results Recent Results (from the past 240 hour(s))  Culture, blood (Routine X 2) w Reflex to ID Panel     Status: None (Preliminary result)   Collection Time: 06/01/16  4:11 PM  Result Value Ref Range Status   Specimen Description BLOOD LEFT ASSIST CONTROL  Final   Special Requests   Final    BOTTLES DRAWN AEROBIC AND ANAEROBIC ANA11ML AER10ML   Culture NO GROWTH 2 DAYS  Final   Report Status PENDING  Incomplete  Culture, blood (Routine X 2) w Reflex to ID Panel     Status: None (Preliminary result)   Collection Time: 06/01/16  4:12 PM  Result Value Ref Range Status   Specimen Description BLOOD ALINE  Final   Special Requests   Final    BOTTLES DRAWN AEROBIC AND ANAEROBIC ANA10ML AER10ML   Culture NO GROWTH 2 DAYS  Final   Report Status PENDING  Incomplete  MRSA PCR Screening     Status: None   Collection Time: 06/01/16  4:13 PM  Result Value Ref Range Status   MRSA by PCR NEGATIVE NEGATIVE Final    Comment:        The GeneXpert MRSA Assay (FDA approved for NASAL specimens only), is one component of a comprehensive MRSA colonization surveillance program. It is not intended to diagnose MRSA infection nor to guide or monitor treatment for MRSA infections.     Radiology Reports Dg Chest 2 View  Result Date: 06/01/2016 CLINICAL DATA:  Chest pain EXAM: CHEST  2 VIEW COMPARISON:  05/03/2016 FINDINGS: Right upper extremity PICC with tip at the  SVC level. Stable borderline cardiomegaly. There is no edema, consolidation, effusion, or pneumothorax. Stable mid thoracic degenerative disc narrowing and endplate sclerosis. IMPRESSION: 1. Stable exam.  No evidence of active disease. 2.  Right upper extremity PICC in good position. Electronically Signed   By: Marnee Spring M.D.   On: 06/01/2016 10:06   Mr Foot Right Wo Contrast  Result Date: 06/02/2016 CLINICAL DATA:  Patient with history of an ulceration on the lateral aspect of the right foot over the last few months. Status post resection of the proximal fifth metatarsal, debridement of necrotic tissue and placement of antibiotic impregnated beads 05/08/2016. Patient admitted to the hospital 06/01/2016 with fever. EXAM: MRI OF THE RIGHT FOREFOOT WITHOUT CONTRAST TECHNIQUE: Multiplanar, multisequence MR imaging of the right foot was performed. No intravenous contrast was administered. COMPARISON:  MRI right foot 05/08/2016.  Plain films this same day. FINDINGS: Surgical wound is seen over the lateral foot with underlying antibiotic impregnated beads in place. The proximal fifth metatarsal has been resected. No marrow edema is seen in the remnant of the fifth metatarsal. There is marrow edema throughout the cuboid and lateral cuneiform which is more extensive and intense than that seen on the prior MRI are. Marrow edema is also seen in the proximal 2 cm of the second and third metatarsals, markedly increased compared the prior MRI. Milder degree of marrow edema is seen in subchondral bone of the navicular and medial and middle cuneiforms and subchondral bone of the base of the first metatarsal. Edema throughout intrinsic musculature the foot does not appear markedly changed compared to the prior MRI. Subcutaneous edema is seen about the ankle and foot. No focal fluid collection is seen. IMPRESSION: Marrow edema in the lateral cuneiform, cuboid and proximal second and third metatarsals is more extensive and intense than that seen on the prior MRI and worrisome for osteomyelitis. Milder degree of marrow edema mainly in subchondral bone of the proximal fourth metatarsal, navicular, medial and middle cuneiforms and first metatarsal also appears  increased compared to the prior study and could be due to osteomyelitis or reactive change. Status post resection of the proximal fifth metatarsal with antibiotic impregnated beads in place. Signal in the remnant of the fifth metatarsal is normal. Large wound at the surgical site.  Negative for abscess. Edema throughout all intrinsic musculature the foot is unchanged and could be inflammatory or due to early atrophy. Electronically Signed   By: Drusilla Kanner M.D.   On: 06/02/2016 13:57   Mr Foot Right Wo Contrast  Result Date: 05/08/2016 CLINICAL DATA:  Open wound involving the base of the fifth metatarsal. Diabetic. EXAM: MRI OF THE RIGHT FOREFOOT WITHOUT CONTRAST TECHNIQUE: Multiplanar, multisequence MR imaging of the right foot was performed. No intravenous contrast was administered. COMPARISON:  Radiographs 05/03/2016 FINDINGS: Diffuse subcutaneous soft tissue swelling/edema/ fluid consistent with cellulitis. There is diffuse edema like signal abnormality in the foot musculature consistent with myofasciitis. Large open wound is noted on the lateral aspect of the foot in the region of the base of the fifth metatarsal. There is some underlying fluid. There is also fluid tracking along the plantar aspect of the fourth metatarsal which could be an abscess. Difficult to be certain without contrast. T2 and STIR signal abnormality in the base of the fifth metatarsal and also along the lateral margin of the cuboid suspicious for osteomyelitis. No definite MR findings for septic arthritis. IMPRESSION: 1. Large open wound along the lateral aspect of the midfoot in the region  of the base of the fifth metatarsal. This appears to extend right down to the bone. 2. Diffuse cellulitis and myofasciitis. 3. Fluid collection tracking from the area of the open wound along the plantar aspect of the fourth metatarsal is worrisome for an abscess. 4. Findings suspicious for osteomyelitis involving the base of the fifth  metatarsal and the lateral aspect of the cuboid. Electronically Signed   By: Rudie Meyer M.D.   On: 05/08/2016 11:10   Dg Foot Complete Right  Result Date: 06/02/2016 CLINICAL DATA:  Patient with history of an ulceration on the lateral aspect of the right foot over the last few months. Status post resection of the proximal fifth metatarsal, debridement of necrotic tissue and placement of antibiotic impregnated beads 05/08/2016. Patient admitted to the hospital 06/01/2016 with fever. EXAM: RIGHT FOOT COMPLETE - 3+ VIEW COMPARISON:  Plain films left foot 05/03/2016. MRI left ankle 05/08/2016. FINDINGS: Postoperative change of resection of the proximal fifth metatarsal and placement of antibiotic impregnated beads is identified. Soft tissues about the foot appear mildly swollen. Skin ulceration versus marked thinning of cutaneous tissues over the surgical site is identified. No soft tissue gas collection or bony destructive change. IMPRESSION: Status post resection of proximal fifth metatarsal and placement of antibiotic impregnated beads. No plain film evidence of osteomyelitis or other acute bony abnormality. Skin over the patient's surgical site appears markedly thinned. Electronically Signed   By: Drusilla Kanner M.D.   On: 06/02/2016 13:01     CBC  Recent Labs Lab 06/01/16 1003 06/02/16 0229 06/03/16 0534  WBC 5.9 6.2 7.8  HGB 9.3* 9.8* 9.6*  HCT 26.5* 27.1* 27.6*  PLT 327 323 324  MCV 90.2 89.8 90.0  MCH 31.7 32.5 31.3  MCHC 35.1 36.2* 34.7  RDW 16.7* 16.6* 16.4*  LYMPHSABS 0.4*  --   --   MONOABS 0.6  --   --   EOSABS 0.1  --   --   BASOSABS 0.0  --   --     Chemistries   Recent Labs Lab 06/01/16 1003 06/02/16 0229 06/03/16 0534  NA 138 137 139  K 3.5 3.2* 3.1*  CL 108 106 108  CO2 22 22 23   GLUCOSE 135* 119* 53*  BUN 12 16 20   CREATININE 1.08 1.25* 1.25*  CALCIUM 8.4* 8.4* 8.2*  AST 35  --   --   ALT 23  --   --   ALKPHOS 92  --   --   BILITOT 0.4  --   --     ------------------------------------------------------------------------------------------------------------------ estimated creatinine clearance is 52.9 mL/min (by C-G formula based on SCr of 1.25 mg/dL (H)). ------------------------------------------------------------------------------------------------------------------ No results for input(s): HGBA1C in the last 72 hours. ------------------------------------------------------------------------------------------------------------------ No results for input(s): CHOL, HDL, LDLCALC, TRIG, CHOLHDL, LDLDIRECT in the last 72 hours. ------------------------------------------------------------------------------------------------------------------ No results for input(s): TSH, T4TOTAL, T3FREE, THYROIDAB in the last 72 hours.  Invalid input(s): FREET3 ------------------------------------------------------------------------------------------------------------------ No results for input(s): VITAMINB12, FOLATE, FERRITIN, TIBC, IRON, RETICCTPCT in the last 72 hours.  Coagulation profile No results for input(s): INR, PROTIME in the last 168 hours.  No results for input(s): DDIMER in the last 72 hours.  Cardiac Enzymes  Recent Labs Lab 06/01/16 1424 06/01/16 2101 06/02/16 0229  TROPONINI 0.05* 0.07* 0.08*   ------------------------------------------------------------------------------------------------------------------ Invalid input(s): POCBNP    Assessment & Plan   66 year old male with history of peripheral vascular disease status post stenting and PTCA in July, CAD, can make kidney disease stage III and recent hospitalization for right  foot nonhealing ulcer with osteomyelitis who presents with chest pain and sepsis.  1. Sepsis: Patient presents with fever and tachypnea Source his foot, mri confirms worsening om, seen by podiatry recommens vascular surg eval for bka Blood cultures no growth so far  continue Zosyn  2. Chest  pain/pressure with history of CAD recent NSTEMI: Continue telemetry and monitoring of troponins Seen by cardiology didn't feel the pain is atypical and noncardiac no further workup needed at this point    3. Accelerated hypertension: Continue Coreg and add ACE inhibitor blood pressure stable   4. Chronic kidney disease stage III continue monitor stable   5. Diabetes: Hold oral medications. Hypoglycemia earlier today stop Lantus will monitor blood sugar with only sliding scale for now  6.Tobacco dependence: Counseling done yest  7. Anemia of chronic disease: Patient has received PRBCs during last hospitalization. Continue to monitor CBC.  8. Right foot osteo: See note above vascular surgery consult for amputation      Code Status Orders        Start     Ordered   06/01/16 1542  Full code  Continuous     06/01/16 1541    Code Status History    Date Active Date Inactive Code Status Order ID Comments User Context   05/03/2016 10:54 PM 05/11/2016  6:08 PM Full Code [id number]  Enedina Finner, MD Inpatient   03/27/2016  7:34 PM 03/29/2016  5:47 PM Full Code [id number]  Enedina Finner, MD Inpatient   02/14/2016  6:10 PM 02/15/2016  5:03 PM Full Code [id number]  Alford Highland, MD ED   01/13/2016  9:42 AM 01/17/2016 11:47 PM Full Code [id number]  Eugenie Norrie, NP ED           Consults  Podiatry and cardiology  DVT Prophylaxis  Lovenox   Lab Results  Component Value Date   PLT 324 06/03/2016     Time Spent in minutes   Greater than 50% of time spent in care coordination and counseling patient regarding the condition and plan of care.   Auburn Bilberry M.D on 06/03/2016 at 1:21 PM  Between 7am to 6pm - Pager - [phone]  After 6pm go to [email or web address] - password [email or web address] [facility]  [facility]   Office  [phone]

## 2016-06-03 NOTE — Progress Notes (Signed)
Subjective: Patient seen. States he is overall feeling better. No complaints of pain in the foot.  Objective: Vital signs in last 24 hours: Temp:  [98.4 F (36.9 C)-99.2 F (37.3 C)] 98.4 F (36.9 C) (02/25 0525) Pulse Rate:  [71-100] 71 (02/25 0525) Resp:  [18] 18 (02/25 0525) BP: (117-158)/(60-65) 151/61 (02/25 0525) SpO2:  [98 %] 98 % (02/25 0525) Last BM Date: 06/02/16  Intake/Output from previous day: 02/24 0701 - 02/25 0700 In: 0  Out: 450 [Urine:450] Intake/Output this shift: No intake/output data recorded.  Dressing intact on the right foot. Wound was not evaluated today.  Lab Results:   Recent Labs  06/02/16 0229 06/03/16 0534  WBC 6.2 7.8  HGB 9.8* 9.6*  HCT 27.1* 27.6*  PLT 323 324   BMET  Recent Labs  06/02/16 0229 06/03/16 0534  NA 137 139  K 3.2* 3.1*  CL 106 108  CO2 22 23  GLUCOSE 119* 53*  BUN 16 20  CREATININE 1.25* 1.25*  CALCIUM 8.4* 8.2*   PT/INR No results for input(s): LABPROT, INR in the last 72 hours. ABG No results for input(s): PHART, HCO3 in the last 72 hours.  Invalid input(s): PCO2, PO2  Studies/Results: Dg Chest 2 View  Result Date: 06/01/2016 CLINICAL DATA:  Chest pain EXAM: CHEST  2 VIEW COMPARISON:  05/03/2016 FINDINGS: Right upper extremity PICC with tip at the SVC level. Stable borderline cardiomegaly. There is no edema, consolidation, effusion, or pneumothorax. Stable mid thoracic degenerative disc narrowing and endplate sclerosis. IMPRESSION: 1. Stable exam.  No evidence of active disease. 2. Right upper extremity PICC in good position. Electronically Signed   By: Marnee Spring M.D.   On: 06/01/2016 10:06   Mr Foot Right Wo Contrast  Result Date: 06/02/2016 CLINICAL DATA:  Patient with history of an ulceration on the lateral aspect of the right foot over the last few months. Status post resection of the proximal fifth metatarsal, debridement of necrotic tissue and placement of antibiotic impregnated beads  05/08/2016. Patient admitted to the hospital 06/01/2016 with fever. EXAM: MRI OF THE RIGHT FOREFOOT WITHOUT CONTRAST TECHNIQUE: Multiplanar, multisequence MR imaging of the right foot was performed. No intravenous contrast was administered. COMPARISON:  MRI right foot 05/08/2016.  Plain films this same day. FINDINGS: Surgical wound is seen over the lateral foot with underlying antibiotic impregnated beads in place. The proximal fifth metatarsal has been resected. No marrow edema is seen in the remnant of the fifth metatarsal. There is marrow edema throughout the cuboid and lateral cuneiform which is more extensive and intense than that seen on the prior MRI are. Marrow edema is also seen in the proximal 2 cm of the second and third metatarsals, markedly increased compared the prior MRI. Milder degree of marrow edema is seen in subchondral bone of the navicular and medial and middle cuneiforms and subchondral bone of the base of the first metatarsal. Edema throughout intrinsic musculature the foot does not appear markedly changed compared to the prior MRI. Subcutaneous edema is seen about the ankle and foot. No focal fluid collection is seen. IMPRESSION: Marrow edema in the lateral cuneiform, cuboid and proximal second and third metatarsals is more extensive and intense than that seen on the prior MRI and worrisome for osteomyelitis. Milder degree of marrow edema mainly in subchondral bone of the proximal fourth metatarsal, navicular, medial and middle cuneiforms and first metatarsal also appears increased compared to the prior study and could be due to osteomyelitis or reactive change. Status post  resection of the proximal fifth metatarsal with antibiotic impregnated beads in place. Signal in the remnant of the fifth metatarsal is normal. Large wound at the surgical site.  Negative for abscess. Edema throughout all intrinsic musculature the foot is unchanged and could be inflammatory or due to early atrophy.  Electronically Signed   By: Drusilla Kannerhomas  Dalessio M.D.   On: 06/02/2016 13:57   Dg Foot Complete Right  Result Date: 06/02/2016 CLINICAL DATA:  Patient with history of an ulceration on the lateral aspect of the right foot over the last few months. Status post resection of the proximal fifth metatarsal, debridement of necrotic tissue and placement of antibiotic impregnated beads 05/08/2016. Patient admitted to the hospital 06/01/2016 with fever. EXAM: RIGHT FOOT COMPLETE - 3+ VIEW COMPARISON:  Plain films left foot 05/03/2016. MRI left ankle 05/08/2016. FINDINGS: Postoperative change of resection of the proximal fifth metatarsal and placement of antibiotic impregnated beads is identified. Soft tissues about the foot appear mildly swollen. Skin ulceration versus marked thinning of cutaneous tissues over the surgical site is identified. No soft tissue gas collection or bony destructive change. IMPRESSION: Status post resection of proximal fifth metatarsal and placement of antibiotic impregnated beads. No plain film evidence of osteomyelitis or other acute bony abnormality. Skin over the patient's surgical site appears markedly thinned. Electronically Signed   By: Drusilla Kannerhomas  Dalessio M.D.   On: 06/02/2016 13:01    Anti-infectives: Anti-infectives    Start     Dose/Rate Route Frequency Ordered Stop   06/01/16 1600  piperacillin-tazobactam (ZOSYN) IVPB 3.375 g     3.375 g 12.5 mL/hr over 240 Minutes Intravenous Every 8 hours 06/01/16 1549        Assessment/Plan: s/p * No surgery found * Assessment: Likely osteomyelitis multiple bones right midfoot   Plan: Discussed with the patient that the MRI results did show findings consistent with osteomyelitis in multiple bones of his right foot including the cuboid and third and fourth metatarsals. Unfortunately this does not leave any significant options for limb salvage for his foot. Discussed with the patient that he will most likely need to undergo amputation of his  right leg. At this point the foot does still look relatively stable but would recommend a consult with vascular surgery to determine timing and level of amputation of the right lower extremity. Discussed with the patient that hopefully he will do fairly well with a prosthesis to be able to ambulate again.  LOS: 2 days    Ricci Barkerodd W Chawn Spraggins 06/03/2016

## 2016-06-03 NOTE — Progress Notes (Signed)
Initial Nutrition Assessment  DOCUMENTATION CODES:   Severe malnutrition in context of chronic illness  INTERVENTION:  -Glucerna Shake po TID, each supplement provides 220 kcal and 10 grams of protein  NUTRITION DIAGNOSIS:   Malnutrition related to chronic illness as evidenced by severe depletion of muscle mass, moderate depletion of body fat, moderate depletions of muscle mass, percent weight loss.  GOAL:   Patient will meet greater than or equal to 90% of their needs  MONITOR:   PO intake, I & O's, Supplement acceptance, Labs, Weight trends  REASON FOR ASSESSMENT:   Consult Assessment of nutrition requirement/status  ASSESSMENT:   Damon [name]  is a [age] y.o. male with a known history of Enterobacter and group B strep right foot nonhealing ulcer with osteomyelitis currently on Zosyn, chronic kidney disease stage III, CAD and peripheral arterial disease who presents with chest pain and found to have a fever of 100.6  Spoke with Mr. Damon [name] at bedside. He was flat during my visit; states he was informed this morning that his RLE would have to be amputated. Had Eggs, Tomasa Blase[employer]acon and [employer] he ate most for breakfast. Reports a normal weight of 170# - Per chart, Exhibits a 23#/14.3% severe wt loss over 1 month. Comes from [place] where he states he was eating 2 meals per day dependent upon "whatever was on the menu." Was consuming glucerna there as well. Denies nausea/vomiting/diarrhea/constipation. Denies any chewing/swallowing problems.  Nutrition-Focused physical exam completed. Findings are moderate-severe fat depletion, moderate-severe muscle depletion, and no edema.   Labs and medicatiosn reviewed:   Diet Order:  Diet Carb Modified Fluid consistency: Thin; Room service appropriate? Yes  Skin:  Wound (see comment) (R diabetic foot ulcer)  Last BM:  06/02/2016  Height:   Ht Readings from Last 1 Encounters:  06/01/16 5\' 10"  (1.778 m)    Weight:   Wt Readings from Last 1  Encounters:  06/02/16 137 lb 14.4 oz (62.6 kg)    Ideal Body Weight:  75.45 kg  BMI:  Body mass index is 19.79 kg/m.  Estimated Nutritional Needs:   Kcal:  1600-1900 calories  Protein:  74-87 gm  Fluid:  >/= 1.6L  EDUCATION NEEDS:   No education needs identified at this time  Dionne AnoWilliam M. Ajanay Farve, MS, RD LDN Inpatient Clinical Dietitian Pager [phone]

## 2016-06-03 NOTE — Consult Note (Signed)
Reason for Consult:Unsalvageable right foot osteomyelitis Referring Physician: Dr. Drinda [name] is an 66 y.o. [name]  HPI: Patient with osteomyelitis of right foot, unsalvageable. Recent  Revascularization on 05/07/2016 with SFA and popliteal stenting- 2 vessel runoff- AT and peroneal. Now requires [name] right BKA as no other options for limb salvage are optimal.  Past Medical History:  Diagnosis Date  . CAD (coronary artery disease)    [name] 01/2016 MI/PCI: LM nl, LAD 116md (3.25 x 28 Xience DES), 40d, LCX 30ost, OM1 95 (staged - 2.75 x 18 Xience Alpine DES), OM2/3 min irregs, RCA 839m . Chronic combined systolic and diastolic CHF (congestive heart failure) ([facility] [place]   [name] 01/2016 Echo: Ef 25-30%, Gr1 DD, mild AI, mildly dil LA;  b. 02/2016 Echo: EF 30-35%, apical AK, antsept and ant HK, nl RV fxn.  . CKD (chronic kidney disease), stage III   . COPD (chronic obstructive pulmonary disease) ([facility]  . Diabetes mellitus without complication ([facility]  . Diverticulitis   . Essential hypertension   . Ischemic cardiomyopathy    [name] 01/2016 Echo: Ef 25-30%;  b. 02/2016 Echo: EF 30-35%.  . Marland KitchenBBB (left bundle branch block)   . PAD (peripheral artery disease) ([facility]   [name] 10/2015 s/p PTA and stenting of the RCIA, LCIA, and PTA of the LEIA (Schnier); b. 04/2016 PTA or R tibioperoneal trunk & prox PT, PTA/DBA of R Pop and SFA, Viabahn covered stent x 2 to the R SFA and pop (58m44m 25 cm & 58mm46m15cm).  . Tobacco abuse     Past Surgical History:  Procedure Laterality Date  . CARDIAC CATHETERIZATION N/[name] 01/13/2016   Procedure: LEFT HEART CATH AND CORONARY ANGIOGRAPHY;  Surgeon: MuhaWellington Hampshire;  [facility];  Service: Cardiovascular;  Laterality: N/[name];  . CARDIAC CATHETERIZATION N/[name] 01/13/2016   Procedure: Coronary Stent Intervention;  Surgeon: MuhaWellington Hampshire;  [facility];  Service: Cardiovascular;  Laterality: N/[name];  . CARDIAC CATHETERIZATION N/[name] 01/16/2016    Procedure: Left Heart Cath and Coronary Angiography;  Surgeon: MuhaWellington Hampshire;  [facility];  Service: Cardiovascular;  Laterality: N/[name];  . IRRIGATION AND DEBRIDEMENT FOOT Right 05/08/2016   Procedure: IRRIGATION AND DEBRIDEMENT FOOT WITH PLACEMENT OF ANTIBIOTIC BEADS;  Surgeon: ToddSharlotte AlamoM;  [facility];  Service: Podiatry;  Laterality: Right;  . NECK SURGERY    . PERIPHERAL VASCULAR CATHETERIZATION Left 11/01/2015   Procedure: Lower Extremity Angiography;  Surgeon: GregKatha Cabal;  [facility];  Service: Cardiovascular;  Laterality: Left;  . PERIPHERAL VASCULAR CATHETERIZATION Right 05/07/2016   Procedure: Lower Extremity Angiography;  Surgeon: JasoAlgernon Huxley;  [facility];  Service: Cardiovascular;  Laterality: Right;    Family History  Problem Relation Age of Onset  . Intracerebral hemorrhage Mother   . Diabetes Mother   . Cancer Mother   . Diabetes Father     Social History:  reports that he has been smoking Cigarettes.  He has [name] 7.50 pack-year smoking history. He uses smokeless tobacco. He reports that he does not drink alcohol or use drugs.  Allergies: No Known Allergies  Medications: I have reviewed the patient's current medications.  Results for orders placed or performed during the hospital encounter of 06/01/16 (from the past 48 hour(s))  Culture, blood (Routine X 2) w Reflex to ID Panel     Status: None (  Preliminary result)   Collection Time: 06/01/16  4:11 PM  Result Value Ref Range   Specimen Description BLOOD LEFT ASSIST CONTROL    Special Requests      BOTTLES DRAWN AEROBIC AND ANAEROBIC ANA11ML AER10ML   Culture NO GROWTH 2 DAYS    Report Status PENDING   HIV antibody (Routine Testing)     Status: None   Collection Time: 06/01/16  4:11 PM  Result Value Ref Range   HIV Screen 4th Generation wRfx Non Reactive Non Reactive    Comment: (NOTE) Performed At: [facility] [place], [place] [id number] Lindon Romp MD WU:[id number]   Culture, blood (Routine X 2) w Reflex to ID Panel     Status: None (Preliminary result)   Collection Time: 06/01/16  4:12 PM  Result Value Ref Range   Specimen Description BLOOD ALINE    Special Requests      BOTTLES DRAWN AEROBIC AND ANAEROBIC ANA10ML AER10ML   Culture NO GROWTH 2 DAYS    Report Status PENDING   MRSA PCR Screening     Status: None   Collection Time: 06/01/16  4:13 PM  Result Value Ref Range   MRSA by PCR NEGATIVE NEGATIVE    Comment:        The GeneXpert MRSA Assay (FDA approved for NASAL specimens only), is one component of [name] comprehensive MRSA colonization surveillance program. It is not intended to diagnose MRSA infection nor to guide or monitor treatment for MRSA infections.   Glucose, capillary     Status: Abnormal   Collection Time: 06/01/16  4:46 PM  Result Value Ref Range   Glucose-Capillary 121 (H) 65 - 99 mg/dL  Glucose, capillary     Status: Abnormal   Collection Time: 06/01/16  8:57 PM  Result Value Ref Range   Glucose-Capillary 141 (H) 65 - 99 mg/dL  Troponin I     Status: Abnormal   Collection Time: 06/01/16  9:01 PM  Result Value Ref Range   Troponin I 0.07 (HH) <0.03 ng/mL    Comment: CRITICAL VALUE NOTED. VALUE IS CONSISTENT WITH PREVIOUSLY REPORTED/CALLED VALUE.PMH  Urinalysis, Routine w reflex microscopic     Status: Abnormal   Collection Time: 06/01/16  9:19 PM  Result Value Ref Range   Color, Urine COLORLESS ([name]) YELLOW   APPearance CLEAR ([name]) CLEAR   Specific Gravity, Urine 1.005 1.005 - 1.030   pH 5.0 5.0 - 8.0   Glucose, UA NEGATIVE NEGATIVE mg/dL   Hgb urine dipstick NEGATIVE NEGATIVE   Bilirubin Urine NEGATIVE NEGATIVE   Ketones, ur NEGATIVE NEGATIVE mg/dL   Protein, ur NEGATIVE NEGATIVE mg/dL   Nitrite NEGATIVE NEGATIVE   Leukocytes, UA NEGATIVE NEGATIVE  Troponin I     Status: Abnormal   Collection Time: 06/02/16  2:29 AM  Result Value Ref Range    Troponin I 0.08 (HH) <0.03 ng/mL    Comment: CRITICAL VALUE NOTED. VALUE IS CONSISTENT WITH PREVIOUSLY REPORTED/CALLED VALUE.PMH  Basic metabolic panel     Status: Abnormal   Collection Time: 06/02/16  2:29 AM  Result Value Ref Range   Sodium 137 135 - 145 mmol/L   Potassium 3.2 (L) 3.5 - 5.1 mmol/L   Chloride 106 101 - 111 mmol/L   CO2 22 22 - 32 mmol/L   Glucose, Bld 119 (H) 65 - 99 mg/dL   BUN 16 6 - 20 mg/dL   Creatinine, Ser 1.25 (H) 0.61 - 1.24 mg/dL   Calcium 8.4 (L) 8.9 -  10.3 mg/dL   GFR calc non Af Amer 59 (L) >60 mL/min   GFR calc Af Amer >60 >60 mL/min    Comment: (NOTE) The eGFR has been calculated using the CKD EPI equation. This calculation has not been validated in all clinical situations. eGFR's persistently <60 mL/min signify possible Chronic Kidney Disease.    Anion gap 9 5 - 15  CBC     Status: Abnormal   Collection Time: 06/02/16  2:29 AM  Result Value Ref Range   WBC 6.2 3.8 - 10.6 K/uL   RBC 3.02 (L) 4.40 - 5.90 MIL/uL   Hemoglobin 9.8 (L) 13.0 - 18.0 g/dL   HCT 27.1 (L) 40.0 - 52.0 %   MCV 89.8 80.0 - 100.0 fL   MCH 32.5 26.0 - 34.0 pg   MCHC 36.2 (H) 32.0 - 36.0 g/dL   RDW 16.6 (H) 11.5 - 14.5 %   Platelets 323 150 - 440 K/uL  Glucose, capillary     Status: None   Collection Time: 06/02/16  7:48 AM  Result Value Ref Range   Glucose-Capillary 79 65 - 99 mg/dL  Glucose, capillary     Status: None   Collection Time: 06/02/16 12:58 PM  Result Value Ref Range   Glucose-Capillary 97 65 - 99 mg/dL  Glucose, capillary     Status: None   Collection Time: 06/02/16  4:32 PM  Result Value Ref Range   Glucose-Capillary 90 65 - 99 mg/dL  Glucose, capillary     Status: Abnormal   Collection Time: 06/02/16  8:39 PM  Result Value Ref Range   Glucose-Capillary 110 (H) 65 - 99 mg/dL   Comment 1 Notify RN    Comment 2 Document in Chart   CBC     Status: Abnormal   Collection Time: 06/03/16  5:34 AM  Result Value Ref Range   WBC 7.8 3.8 - 10.6 K/uL   RBC  3.07 (L) 4.40 - 5.90 MIL/uL   Hemoglobin 9.6 (L) 13.0 - 18.0 g/dL   HCT 27.6 (L) 40.0 - 52.0 %   MCV 90.0 80.0 - 100.0 fL   MCH 31.3 26.0 - 34.0 pg   MCHC 34.7 32.0 - 36.0 g/dL   RDW 16.4 (H) 11.5 - 14.5 %   Platelets 324 150 - 440 K/uL  Basic metabolic panel     Status: Abnormal   Collection Time: 06/03/16  5:34 AM  Result Value Ref Range   Sodium 139 135 - 145 mmol/L   Potassium 3.1 (L) 3.5 - 5.1 mmol/L   Chloride 108 101 - 111 mmol/L   CO2 23 22 - 32 mmol/L   Glucose, Bld 53 (L) 65 - 99 mg/dL   BUN 20 6 - 20 mg/dL   Creatinine, Ser 1.25 (H) 0.61 - 1.24 mg/dL   Calcium 8.2 (L) 8.9 - 10.3 mg/dL   GFR calc non Af Amer 59 (L) >60 mL/min   GFR calc Af Amer >60 >60 mL/min    Comment: (NOTE) The eGFR has been calculated using the CKD EPI equation. This calculation has not been validated in all clinical situations. eGFR's persistently <60 mL/min signify possible Chronic Kidney Disease.    Anion gap 8 5 - 15  Glucose, capillary     Status: Abnormal   Collection Time: 06/03/16  7:52 AM  Result Value Ref Range   Glucose-Capillary 45 (L) 65 - 99 mg/dL  Glucose, capillary     Status: None   Collection Time: 06/03/16  8:22 AM  Result Value Ref Range   Glucose-Capillary 82 65 - 99 mg/dL  Glucose, capillary     Status: Abnormal   Collection Time: 06/03/16 11:54 AM  Result Value Ref Range   Glucose-Capillary 123 (H) 65 - 99 mg/dL    Mr Foot Right Wo Contrast  Result Date: 06/02/2016 CLINICAL DATA:  Patient with history of an ulceration on the lateral aspect of the right foot over the last few months. Status post resection of the proximal fifth metatarsal, debridement of necrotic tissue and placement of antibiotic impregnated beads 05/08/2016. Patient admitted to the hospital 06/01/2016 with fever. EXAM: MRI OF THE RIGHT FOREFOOT WITHOUT CONTRAST TECHNIQUE: Multiplanar, multisequence MR imaging of the right foot was performed. No intravenous contrast was administered. COMPARISON:  MRI  right foot 05/08/2016.  Plain films this same day. FINDINGS: Surgical wound is seen over the lateral foot with underlying antibiotic impregnated beads in place. The proximal fifth metatarsal has been resected. No marrow edema is seen in the remnant of the fifth metatarsal. There is marrow edema throughout the cuboid and lateral cuneiform which is more extensive and intense than that seen on the prior MRI are. Marrow edema is also seen in the proximal 2 cm of the second and third metatarsals, markedly increased compared the prior MRI. Milder degree of marrow edema is seen in subchondral bone of the navicular and medial and middle cuneiforms and subchondral bone of the base of the first metatarsal. Edema throughout intrinsic musculature the foot does not appear markedly changed compared to the prior MRI. Subcutaneous edema is seen about the ankle and foot. No focal fluid collection is seen. IMPRESSION: Marrow edema in the lateral cuneiform, cuboid and proximal second and third metatarsals is more extensive and intense than that seen on the prior MRI and worrisome for osteomyelitis. Milder degree of marrow edema mainly in subchondral bone of the proximal fourth metatarsal, navicular, medial and middle cuneiforms and first metatarsal also appears increased compared to the prior study and could be due to osteomyelitis or reactive change. Status post resection of the proximal fifth metatarsal with antibiotic impregnated beads in place. Signal in the remnant of the fifth metatarsal is normal. Large wound at the surgical site.  Negative for abscess. Edema throughout all intrinsic musculature the foot is unchanged and could be inflammatory or due to early atrophy. Electronically Signed   By: Inge Rise M.D.   On: 06/02/2016 13:57   Dg Foot Complete Right  Result Date: 06/02/2016 CLINICAL DATA:  Patient with history of an ulceration on the lateral aspect of the right foot over the last few months. Status post  resection of the proximal fifth metatarsal, debridement of necrotic tissue and placement of antibiotic impregnated beads 05/08/2016. Patient admitted to the hospital 06/01/2016 with fever. EXAM: RIGHT FOOT COMPLETE - 3+ VIEW COMPARISON:  Plain films left foot 05/03/2016. MRI left ankle 05/08/2016. FINDINGS: Postoperative change of resection of the proximal fifth metatarsal and placement of antibiotic impregnated beads is identified. Soft tissues about the foot appear mildly swollen. Skin ulceration versus marked thinning of cutaneous tissues over the surgical site is identified. No soft tissue gas collection or bony destructive change. IMPRESSION: Status post resection of proximal fifth metatarsal and placement of antibiotic impregnated beads. No plain film evidence of osteomyelitis or other acute bony abnormality. Skin over the patient's surgical site appears markedly thinned. Electronically Signed   By: Inge Rise M.D.   On: 06/02/2016 13:01    Review of Systems  Constitutional: Negative for  fever and malaise/fatigue.  HENT: Negative.   Respiratory: Negative for cough.   Cardiovascular: Negative for chest pain and claudication.  Gastrointestinal: Negative.   Musculoskeletal:       Osteomyelitis right foot  Neurological: Negative.    Blood pressure (!) 124/59, pulse 74, temperature 99.5 F (37.5 C), temperature source Oral, resp. rate 18, height '5\' 10"'  (1.778 m), weight 62.6 kg (137 lb 14.4 oz), SpO2 99 %. Physical Exam  Nursing note and vitals reviewed. Constitutional: He is oriented to person, place, and time. He appears well-developed.  Cardiovascular: Normal rate and regular rhythm.   Respiratory: Effort normal and breath sounds normal.  Musculoskeletal: He exhibits edema. He exhibits no tenderness.  Right foot osteomyelitis  Neurological: He is alert and oriented to person, place, and time.  Skin: Skin is warm.    Assessment/Plan:  Unsalvageable right foot  Osteomyelitis Adequate perfusion s/p right SFA and popliteal stenting for [name] BKA to heal.  Will plan for this week per Dr. Dew/ Dr. Rene Paci, Damon [name] 06/03/2016, 2:36 PM

## 2016-06-03 NOTE — Plan of Care (Signed)
Problem: Nutrition: Goal: Adequate nutrition will be maintained Outcome: Progressing Glucerna added to patients MAR. Patient encouraged to drink glucerna and patient shows acceptance,

## 2016-06-04 DIAGNOSIS — Z01810 Encounter for preprocedural cardiovascular examination: Secondary | ICD-10-CM

## 2016-06-04 DIAGNOSIS — M86171 Other acute osteomyelitis, right ankle and foot: Secondary | ICD-10-CM

## 2016-06-04 DIAGNOSIS — I5022 Chronic systolic (congestive) heart failure: Secondary | ICD-10-CM

## 2016-06-04 DIAGNOSIS — R0789 Other chest pain: Secondary | ICD-10-CM

## 2016-06-04 LAB — BASIC METABOLIC PANEL
Anion gap: 5 (ref 5–15)
BUN: 27 mg/dL — AB (ref 6–20)
CALCIUM: 7.9 mg/dL — AB (ref 8.9–10.3)
CO2: 23 mmol/L (ref 22–32)
CREATININE: 1.36 mg/dL — AB (ref 0.61–1.24)
Chloride: 110 mmol/L (ref 101–111)
GFR calc Af Amer: >60 mL/min (ref >60)
GFR, EST NON AFRICAN AMERICAN: 53 mL/min — AB (ref >60)
Glucose, Bld: 159 mg/dL — ABNORMAL HIGH (ref 65–99)
POTASSIUM: 3.5 mmol/L (ref 3.5–5.1)
SODIUM: 138 mmol/L (ref 135–145)

## 2016-06-04 LAB — GLUCOSE, CAPILLARY
GLUCOSE-CAPILLARY: 172 mg/dL — AB (ref 65–99)
GLUCOSE-CAPILLARY: 150 mg/dL — AB (ref 65–99)
GLUCOSE-CAPILLARY: 175 mg/dL — AB (ref 65–99)
Glucose-Capillary: 128 mg/dL — ABNORMAL HIGH (ref 65–99)

## 2016-06-04 MED ORDER — SODIUM CHLORIDE 0.9 % IV SOLN
INTRAVENOUS | Status: DC
  Administered 2016-06-06: 10:00:00 via INTRAVENOUS

## 2016-06-04 MED ORDER — CLOPIDOGREL BISULFATE 75 MG PO TABS
75.0000 mg | ORAL_TABLET | Freq: Every day | ORAL | Status: DC
  Administered 2016-06-04 – 2016-06-11 (×8): 75 mg via ORAL
  Filled 2016-06-04 (×8): qty 1

## 2016-06-04 NOTE — Progress Notes (Signed)
Pt. Had 11 beats of v-tach, Dr. Sheryle Hailiamond aware. Pt alert and oriented with no c/o pain, SOB or acute distress noted. Will continue to monitor pt.

## 2016-06-04 NOTE — Progress Notes (Signed)
Progress Note  Patient Name: Damon [name] Date of Encounter: 06/04/2016  Primary Cardiologist: Kirke CorinArida  Subjective   He reports improvement in shortness of breath with no chest pain. The patient was found to have right foot osteomyelitis with plans for below the knee amputation in few days.   Inpatient Medications    Scheduled Meds: . aspirin EC  81 mg Oral Daily  . atorvastatin  80 mg Oral q1800  . carvedilol  12.5 mg Oral BID WC  . clopidogrel  75 mg Oral Daily  . enoxaparin (LOVENOX) injection  40 mg Subcutaneous Q24H  . feeding supplement (GLUCERNA SHAKE)  237 mL Oral TID BM  . ferrous sulfate  325 mg Oral TID WC  . hydrALAZINE  25 mg Oral Q8H  . insulin aspart  0-5 Units Subcutaneous QHS  . insulin aspart  0-9 Units Subcutaneous TID WC  . lisinopril  40 mg Oral Daily  . pantoprazole  40 mg Oral Daily  . piperacillin-tazobactam (ZOSYN)  IV  3.375 g Intravenous Q8H  . senna-docusate  1 tablet Oral Daily  . sodium chloride flush  10-40 mL Intracatheter Q12H   Continuous Infusions: . sodium chloride     PRN Meds: acetaminophen **OR** acetaminophen, hydrALAZINE, HYDROcodone-acetaminophen, ondansetron **OR** ondansetron (ZOFRAN) IV, oxyCODONE-acetaminophen, senna-docusate, sodium chloride flush   Vital Signs    Vitals:   06/03/16 1357 06/03/16 2014 06/04/16 0431 06/04/16 1213  BP: (!) 124/59 120/60 (!) 130/57 (!) 139/58  Pulse: 74 73 71 70  Resp:  18 18 14   Temp:  98.1 F (36.7 C) 98.9 F (37.2 C)   TempSrc:  Oral Oral   SpO2:  97% 99% 100%  Weight:   139 lb 1.6 oz (63.1 kg)   Height:        Intake/Output Summary (Last 24 hours) at 06/04/16 1654 Last data filed at 06/04/16 0949  Gross per 24 hour  Intake              717 ml  Output              500 ml  Net              217 ml   Filed Weights   06/01/16 0934 06/02/16 0528 06/04/16 0431  Weight: 158 lb (71.7 kg) 137 lb 14.4 oz (62.6 kg) 139 lb 1.6 oz (63.1 kg)    Telemetry    Normal sinus rhythm. -  Personally Reviewed   Physical Exam   GEN: No acute distress.   Neck: 6 cm JVD Cardiac: RRR with an S4, no murmurs, rubs, or gallops.  Respiratory: Clear to auscultation bilaterally. GI: Soft, nontender, non-distended  MS: No edema; No deformity. Right foot with an open wound on dorsal aspect down to bone Neuro:  Nonfocal but some diffuse slowing of thought and speech Psych: blunted affect   Labs    Chemistry  Recent Labs Lab 06/01/16 1003 06/02/16 0229 06/03/16 0534 06/04/16 0956  NA 138 137 139 138  K 3.5 3.2* 3.1* 3.5  CL 108 106 108 110  CO2 22 22 23 23   GLUCOSE 135* 119* 53* 159*  BUN 12 16 20  27*  CREATININE 1.08 1.25* 1.25* 1.36*  CALCIUM 8.4* 8.4* 8.2* 7.9*  PROT 6.4*  --   --   --   ALBUMIN 2.6*  --   --   --   AST 35  --   --   --   ALT 23  --   --   --  ALKPHOS 92  --   --   --   BILITOT 0.4  --   --   --   GFRNONAA >60 59* 59* 53*  GFRAA >60 >60 >60 >60  ANIONGAP 8 9 8 5      Hematology  Recent Labs Lab 06/01/16 1003 06/02/16 0229 06/03/16 0534  WBC 5.9 6.2 7.8  RBC 2.94* 3.02* 3.07*  HGB 9.3* 9.8* 9.6*  HCT 26.5* 27.1* 27.6*  MCV 90.2 89.8 90.0  MCH 31.7 32.5 31.3  MCHC 35.1 36.2* 34.7  RDW 16.7* 16.6* 16.4*  PLT 327 323 324    Cardiac Enzymes  Recent Labs Lab 06/01/16 1003 06/01/16 1424 06/01/16 2101 06/02/16 0229  TROPONINI 0.05* 0.05* 0.07* 0.08*   No results for input(s): TROPIPOC in the last 168 hours.   BNPNo results for input(s): BNP, PROBNP in the last 168 hours.   DDimer No results for input(s): DDIMER in the last 168 hours.   Radiology    No results found.   Patient Profile     66 year old male with a prior history of CAD status post myocardial infarction with LAD and obtuse marginal stenting in October 2017 in the setting of a new left bundle branch block, ischemic cardiomyopathy with an EF of 30-35%, peripheral arterial disease status post bilateral iliac, right popliteal, right SFA, and right peroneal  interventions, hypertension, hyperlipidemia, diabetes mellitus, and stage III chronic kidney disease who was admitted Feb 23rd with chest pain, hypertensive urgency, and fever.   Assessment & Plan    1  CAD/chest pain.   Hx MI in 2017 with OM stenting  Now in setting of chpertenisve urgency  Minimal elevation in trop.  Probable demand ischemia. Given that he had myocardial infarction in 2 drug-eluting stent placement in October of last year, the patient needs to continue dual antiplatelet therapy even if he undergoes surgery. Thus, I put him on Plavix in addition to aspirin. He was on Brilinta before. I discussed this with Dr. Allena Katz and Dr. Wyn Quaker. The patient is at moderate risk from a cardiac standpoint for amputation.  2  HTN with CHF and hypertensive urgency - his blood pressure is much better.   3  Ischemic cardiomyopathy  LVEF 30 to 35%  Medications adjusted Will nee f/u echo as outpt.    Unlikely to be a good candidate for ICD placement in the setting of significant peripheral arterial disease and amputation.  4  HL  LDL 58 November  5  PAD  REcent Intervention in R Leg - he has a large residual wound on the dorsum of the right foot. Plans for BKA in few days.   Signed, Lorine Bears, MD  06/04/2016, 4:54 PM

## 2016-06-04 NOTE — Progress Notes (Signed)
Pharmacy Antibiotic Note  Damon [name] is a 66 y.o. male admitted on 06/01/2016 with wound infection.  Pharmacy has been consulted for zosyn dosing.  Plan: Zosyn 3.375g IV q8h (4 hour infusion).   Height: 5\' 10"  (177.8 cm) Weight: 139 lb 1.6 oz (63.1 kg) IBW/kg (Calculated) : 73  Temp (24hrs), Avg:98.8 F (37.1 C), Min:98.1 F (36.7 C), Max:99.5 F (37.5 C)   Recent Labs Lab 06/01/16 1003 06/02/16 0229 06/03/16 0534  WBC 5.9 6.2 7.8  CREATININE 1.08 1.25* 1.25*    Estimated Creatinine Clearance: 53.3 mL/min (by C-G formula based on SCr of 1.25 mg/dL (H)).    No Known Allergies  Antimicrobials this admission:   Dose adjustments this admission:   Microbiology results: 2/23 BCx: NG 2/23 WndCx (extremity amputation): Enterobacter Cloacae pan sensitive except for Cefazolin  Thank you for allowing pharmacy to be a part of this patient's care.  Demetrius Charityeldrin D. Takyia Sindt, PharmD  Clinical Pharmacist 06/04/2016

## 2016-06-04 NOTE — Telephone Encounter (Signed)
Sent completed paperwork to [employer] 

## 2016-06-04 NOTE — Progress Notes (Signed)
Physical Therapy Treatment Patient Details Name: Damon [name] MRN: [id number] DOB: May 06, 1950 Today's Date: 06/04/2016    History of Present Illness Patient is a 66 y.o. male admitted on 23 FEB with chest pain and fever. 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 PMH includes CKDIII, CAD, and PAD.    PT Comments    Pt is very weak today. He is able to perform all supine exercises as instructed. He demonstrates improved static sitting balance but is unable to attempt transfers due to weakness and inability to maintain NWB on RLE. Per vascular surgeon R BKA is scheduled for 06/06/16. Pt will benefit from skilled PT services to address deficits in strength, balance, and mobility in order to return to full function at home.    Follow Up Recommendations  SNF     Equipment Recommendations  None recommended by PT    Recommendations for Other Services OT consult     Precautions / Restrictions Precautions Precautions: Fall Restrictions Weight Bearing Restrictions: Yes RLE Weight Bearing: Non weight bearing    Mobility  Bed Mobility Overal bed mobility: Needs Assistance Bed Mobility: Supine to Sit;Sit to Supine     Supine to sit: Mod assist Sit to supine: Mod assist   General bed mobility comments: Pt requires modA+1 for both phases of bed mobility. Initially poor sitting balance but with LLE on floor pt able to maintain with supervision only. Plan to attempt transfers but pt reports that he is too weak and unsteady to attempt. Pt reports he would be unable to attempt and maintain NWB on RLE. Transfers deferred at this time  Transfers                    Ambulation/Gait                 Stairs            Wheelchair Mobility    Modified Rankin (Stroke Patients Only)       Balance Overall balance assessment: Needs assistance Sitting-balance support: Bilateral upper extremity supported;Feet  supported Sitting balance-Leahy Scale: Fair                              Cognition Arousal/Alertness: Awake/alert Behavior During Therapy: Flat affect;WFL for tasks assessed/performed Overall Cognitive Status: Within Functional Limits for tasks assessed                 General Comments: Patient sometimes delayed in answering questions    Exercises General Exercises - Lower Extremity Ankle Circles/Pumps: AROM;Both;10 reps;Supine Quad Sets: Strengthening;Both;10 reps;Supine Gluteal Sets: Strengthening;Both;10 reps;Supine Short Arc Quad: Strengthening;Both;10 reps;Supine Heel Slides: Strengthening;Left;10 reps;Supine Hip ABduction/ADduction: Strengthening;Both;10 reps;Supine Straight Leg Raises: Strengthening;Both;10 reps;Supine    General Comments        Pertinent Vitals/Pain Pain Assessment: 0-10 Pain Score: 5  Pain Location: R foot Pain Descriptors / Indicators: Aching Pain Intervention(s): Monitored during session    Home Living                      Prior Function            PT Goals (current goals can now be found in the care plan section) Acute Rehab PT Goals Patient Stated Goal: "To feel better" PT Goal Formulation: With patient Time For Goal Achievement: 06/16/16 Potential to Achieve Goals: Good Progress towards PT goals: Not progressing toward goals -  comment (Pt weak, awaiting R BKA surgery)    Frequency    Min 2X/week      PT Plan Current plan remains appropriate    Co-evaluation             End of Session   Activity Tolerance: Patient tolerated treatment well Patient left: in bed;with call bell/phone within reach;with bed alarm set Nurse Communication: Mobility status PT Visit Diagnosis: Muscle weakness (generalized) (M62.81);Difficulty in walking, not elsewhere classified (R26.2)     Time: 4098-1191 PT Time Calculation (min) (ACUTE ONLY): 16 min  Charges:  $Therapeutic Exercise: 8-22 mins                     G Codes:      Sharalyn Ink Delena Casebeer PT, DPT   Kaitelyn Jamison 06/04/2016, 5:12 PM

## 2016-06-04 NOTE — Telephone Encounter (Signed)
FMLA paperwork given to Sabrina 

## 2016-06-04 NOTE — Progress Notes (Signed)
Per Dr. Wyn Quakerew, patient's R foot dressing doesn't need changed today as previously ordered. Patient is agreeable to R BKA Wednesday, and dressing doesn't need changed prior unless visibly soiled, foul smelling, etc. Asked patient if this was OK, patient agrees to forego dressing change both today and tomorrow at this time. Jari FavreSteven M [facility]

## 2016-06-04 NOTE — Progress Notes (Signed)
[employer] [facility] [place] [facility]                                                                                                                                                                                  Patient Demographics   Damon [name], is a 66 y.o. male, DOB - 12-03-1950, ZOX:[id number]  Admit date - 06/01/2016   Admitting Physician Adrian Saran, MD  Outpatient Primary MD for the patient is [name], [name], MD   LOS - 3  Subjective: Patient seen by vascular surgery recommend BKA also seen by cardiology who recommended she start on Plavix  Review of Systems:   CONSTITUTIONAL: No documented   fever. No fatigue, weakness. No weight gain, no weight loss.  EYES: No blurry or double vision.  ENT: No tinnitus. No postnasal drip. No redness of the oropharynx.  RESPIRATORY: No cough, no wheeze, no hemoptysis. No dyspnea.  CARDIOVASCULAR: No chest pain. No orthopnea. No palpitations. No syncope.  GASTROINTESTINAL: No nausea, no vomiting or diarrhea. No abdominal pain. No melena or hematochezia.  GENITOURINARY: No dysuria or hematuria.  ENDOCRINE: No polyuria or nocturia. No heat or cold intolerance.  HEMATOLOGY: No anemia. No bruising. No bleeding.  INTEGUMENTARY: No rashes. No lesions.  MUSCULOSKELETAL: No arthritis. No swelling. No gout.  NEUROLOGIC: No numbness, tingling, or ataxia. No seizure-type activity.  PSYCHIATRIC: No anxiety. No insomnia. No ADD.    Vitals:   Vitals:   06/03/16 1357 06/03/16 2014 06/04/16 0431 06/04/16 1213  BP: (!) 124/59 120/60 (!) 130/57 (!) 139/58  Pulse: 74 73 71 70  Resp:  18 18 14   Temp:  98.1 F (36.7 C) 98.9 F (37.2 C)   TempSrc:  Oral Oral   SpO2:  97% 99% 100%  Weight:   139 lb 1.6 oz (63.1 kg)   Height:        Wt Readings from Last 3 Encounters:  06/04/16 139 lb 1.6 oz (63.1 kg)  05/03/16 160 lb (72.6 kg)  05/02/16 165 lb (74.8 kg)     Intake/Output Summary (Last 24 hours) at 06/04/16 1251 Last data filed at  06/04/16 0949  Gross per 24 hour  Intake              717 ml  Output              500 ml  Net              217 ml    Physical Exam:   GENERAL: Pleasant-appearing in no apparent distress.  HEAD, EYES, EARS, NOSE AND THROAT: Atraumatic, normocephalic. Extraocular muscles are intact. Pupils equal and reactive to light. Sclerae anicteric. No conjunctival  injection. No oro-pharyngeal erythema.  NECK: Supple. There is no jugular venous distention. No bruits, no lymphadenopathy, no thyromegaly.  HEART: Regular rate and rhythm,. No murmurs, no rubs, no clicks.  LUNGS: Clear to auscultation bilaterally. No rales or rhonchi. No wheezes.  ABDOMEN: Soft, flat, nontender, nondistended. Has good bowel sounds. No hepatosplenomegaly appreciated.  EXTREMITIES: No evidence of any cyanosis, clubbing, or peripheral edema.  +2 pedal and radial pulses bilaterally. Right foot dressing in place NEUROLOGIC: The patient is alert, awake, and oriented x3 with no focal motor or sensory deficits appreciated bilaterally.  SKIN: Moist and warm with no rashes appreciated.  Psych: Not anxious, depressed LN: No inguinal LN enlargement    Antibiotics   Anti-infectives    Start     Dose/Rate Route Frequency Ordered Stop   06/01/16 1600  piperacillin-tazobactam (ZOSYN) IVPB 3.375 g     3.375 g 12.5 mL/hr over 240 Minutes Intravenous Every 8 hours 06/01/16 1549        Medications   Scheduled Meds: . aspirin EC  81 mg Oral Daily  . atorvastatin  80 mg Oral q1800  . carvedilol  12.5 mg Oral BID WC  . clopidogrel  75 mg Oral Daily  . enoxaparin (LOVENOX) injection  40 mg Subcutaneous Q24H  . feeding supplement (GLUCERNA SHAKE)  237 mL Oral TID BM  . ferrous sulfate  325 mg Oral TID WC  . hydrALAZINE  25 mg Oral Q8H  . insulin aspart  0-5 Units Subcutaneous QHS  . insulin aspart  0-9 Units Subcutaneous TID WC  . lisinopril  40 mg Oral Daily  . pantoprazole  40 mg Oral Daily  . piperacillin-tazobactam (ZOSYN)  IV   3.375 g Intravenous Q8H  . senna-docusate  1 tablet Oral Daily  . sodium chloride flush  10-40 mL Intracatheter Q12H   Continuous Infusions: PRN Meds:.acetaminophen **OR** acetaminophen, hydrALAZINE, HYDROcodone-acetaminophen, ondansetron **OR** ondansetron (ZOFRAN) IV, oxyCODONE-acetaminophen, senna-docusate, sodium chloride flush   Data Review:   Micro Results Recent Results (from the past 240 hour(s))  Culture, blood (Routine X 2) w Reflex to ID Panel     Status: None (Preliminary result)   Collection Time: 06/01/16  4:11 PM  Result Value Ref Range Status   Specimen Description BLOOD LEFT ASSIST CONTROL  Final   Special Requests   Final    BOTTLES DRAWN AEROBIC AND ANAEROBIC ANA11ML AER10ML   Culture NO GROWTH 3 DAYS  Final   Report Status PENDING  Incomplete  Culture, blood (Routine X 2) w Reflex to ID Panel     Status: None (Preliminary result)   Collection Time: 06/01/16  4:12 PM  Result Value Ref Range Status   Specimen Description BLOOD ALINE  Final   Special Requests   Final    BOTTLES DRAWN AEROBIC AND ANAEROBIC ANA10ML AER10ML   Culture NO GROWTH 3 DAYS  Final   Report Status PENDING  Incomplete  MRSA PCR Screening     Status: None   Collection Time: 06/01/16  4:13 PM  Result Value Ref Range Status   MRSA by PCR NEGATIVE NEGATIVE Final    Comment:        The GeneXpert MRSA Assay (FDA approved for NASAL specimens only), is one component of a comprehensive MRSA colonization surveillance program. It is not intended to diagnose MRSA infection nor to guide or monitor treatment for MRSA infections.     Radiology Reports Dg Chest 2 View  Result Date: 06/01/2016 CLINICAL DATA:  Chest pain EXAM: CHEST  2 VIEW COMPARISON:  05/03/2016 FINDINGS: Right upper extremity PICC with tip at the SVC level. Stable borderline cardiomegaly. There is no edema, consolidation, effusion, or pneumothorax. Stable mid thoracic degenerative disc narrowing and endplate sclerosis.  IMPRESSION: 1. Stable exam.  No evidence of active disease. 2. Right upper extremity PICC in good position. Electronically Signed   By: Marnee SpringJonathon  Watts M.D.   On: 06/01/2016 10:06   Mr Foot Right Wo Contrast  Result Date: 06/02/2016 CLINICAL DATA:  Patient with history of an ulceration on the lateral aspect of the right foot over the last few months. Status post resection of the proximal fifth metatarsal, debridement of necrotic tissue and placement of antibiotic impregnated beads 05/08/2016. Patient admitted to the hospital 06/01/2016 with fever. EXAM: MRI OF THE RIGHT FOREFOOT WITHOUT CONTRAST TECHNIQUE: Multiplanar, multisequence MR imaging of the right foot was performed. No intravenous contrast was administered. COMPARISON:  MRI right foot 05/08/2016.  Plain films this same day. FINDINGS: Surgical wound is seen over the lateral foot with underlying antibiotic impregnated beads in place. The proximal fifth metatarsal has been resected. No marrow edema is seen in the remnant of the fifth metatarsal. There is marrow edema throughout the cuboid and lateral cuneiform which is more extensive and intense than that seen on the prior MRI are. Marrow edema is also seen in the proximal 2 cm of the second and third metatarsals, markedly increased compared the prior MRI. Milder degree of marrow edema is seen in subchondral bone of the navicular and medial and middle cuneiforms and subchondral bone of the base of the first metatarsal. Edema throughout intrinsic musculature the foot does not appear markedly changed compared to the prior MRI. Subcutaneous edema is seen about the ankle and foot. No focal fluid collection is seen. IMPRESSION: Marrow edema in the lateral cuneiform, cuboid and proximal second and third metatarsals is more extensive and intense than that seen on the prior MRI and worrisome for osteomyelitis. Milder degree of marrow edema mainly in subchondral bone of the proximal fourth metatarsal, navicular,  medial and middle cuneiforms and first metatarsal also appears increased compared to the prior study and could be due to osteomyelitis or reactive change. Status post resection of the proximal fifth metatarsal with antibiotic impregnated beads in place. Signal in the remnant of the fifth metatarsal is normal. Large wound at the surgical site.  Negative for abscess. Edema throughout all intrinsic musculature the foot is unchanged and could be inflammatory or due to early atrophy. Electronically Signed   By: Drusilla Kannerhomas  Dalessio M.D.   On: 06/02/2016 13:57   Mr Foot Right Wo Contrast  Result Date: 05/08/2016 CLINICAL DATA:  Open wound involving the base of the fifth metatarsal. Diabetic. EXAM: MRI OF THE RIGHT FOREFOOT WITHOUT CONTRAST TECHNIQUE: Multiplanar, multisequence MR imaging of the right foot was performed. No intravenous contrast was administered. COMPARISON:  Radiographs 05/03/2016 FINDINGS: Diffuse subcutaneous soft tissue swelling/edema/ fluid consistent with cellulitis. There is diffuse edema like signal abnormality in the foot musculature consistent with myofasciitis. Large open wound is noted on the lateral aspect of the foot in the region of the base of the fifth metatarsal. There is some underlying fluid. There is also fluid tracking along the plantar aspect of the fourth metatarsal which could be an abscess. Difficult to be certain without contrast. T2 and STIR signal abnormality in the base of the fifth metatarsal and also along the lateral margin of the cuboid suspicious for osteomyelitis. No definite MR findings for septic arthritis. IMPRESSION:  1. Large open wound along the lateral aspect of the midfoot in the region of the base of the fifth metatarsal. This appears to extend right down to the bone. 2. Diffuse cellulitis and myofasciitis. 3. Fluid collection tracking from the area of the open wound along the plantar aspect of the fourth metatarsal is worrisome for an abscess. 4. Findings  suspicious for osteomyelitis involving the base of the fifth metatarsal and the lateral aspect of the cuboid. Electronically Signed   By: Rudie Meyer M.D.   On: 05/08/2016 11:10   Dg Foot Complete Right  Result Date: 06/02/2016 CLINICAL DATA:  Patient with history of an ulceration on the lateral aspect of the right foot over the last few months. Status post resection of the proximal fifth metatarsal, debridement of necrotic tissue and placement of antibiotic impregnated beads 05/08/2016. Patient admitted to the hospital 06/01/2016 with fever. EXAM: RIGHT FOOT COMPLETE - 3+ VIEW COMPARISON:  Plain films left foot 05/03/2016. MRI left ankle 05/08/2016. FINDINGS: Postoperative change of resection of the proximal fifth metatarsal and placement of antibiotic impregnated beads is identified. Soft tissues about the foot appear mildly swollen. Skin ulceration versus marked thinning of cutaneous tissues over the surgical site is identified. No soft tissue gas collection or bony destructive change. IMPRESSION: Status post resection of proximal fifth metatarsal and placement of antibiotic impregnated beads. No plain film evidence of osteomyelitis or other acute bony abnormality. Skin over the patient's surgical site appears markedly thinned. Electronically Signed   By: Drusilla Kanner M.D.   On: 06/02/2016 13:01     CBC  Recent Labs Lab 06/01/16 1003 06/02/16 0229 06/03/16 0534  WBC 5.9 6.2 7.8  HGB 9.3* 9.8* 9.6*  HCT 26.5* 27.1* 27.6*  PLT 327 323 324  MCV 90.2 89.8 90.0  MCH 31.7 32.5 31.3  MCHC 35.1 36.2* 34.7  RDW 16.7* 16.6* 16.4*  LYMPHSABS 0.4*  --   --   MONOABS 0.6  --   --   EOSABS 0.1  --   --   BASOSABS 0.0  --   --     Chemistries   Recent Labs Lab 06/01/16 1003 06/02/16 0229 06/03/16 0534 06/04/16 0956  NA 138 137 139 138  K 3.5 3.2* 3.1* 3.5  CL 108 106 108 110  CO2 22 22 23 23   GLUCOSE 135* 119* 53* 159*  BUN 12 16 20  27*  CREATININE 1.08 1.25* 1.25* 1.36*  CALCIUM  8.4* 8.4* 8.2* 7.9*  AST 35  --   --   --   ALT 23  --   --   --   ALKPHOS 92  --   --   --   BILITOT 0.4  --   --   --    ------------------------------------------------------------------------------------------------------------------ estimated creatinine clearance is 49 mL/min (by C-G formula based on SCr of 1.36 mg/dL (H)). ------------------------------------------------------------------------------------------------------------------ No results for input(s): HGBA1C in the last 72 hours. ------------------------------------------------------------------------------------------------------------------ No results for input(s): CHOL, HDL, LDLCALC, TRIG, CHOLHDL, LDLDIRECT in the last 72 hours. ------------------------------------------------------------------------------------------------------------------ No results for input(s): TSH, T4TOTAL, T3FREE, THYROIDAB in the last 72 hours.  Invalid input(s): FREET3 ------------------------------------------------------------------------------------------------------------------ No results for input(s): VITAMINB12, FOLATE, FERRITIN, TIBC, IRON, RETICCTPCT in the last 72 hours.  Coagulation profile No results for input(s): INR, PROTIME in the last 168 hours.  No results for input(s): DDIMER in the last 72 hours.  Cardiac Enzymes  Recent Labs Lab 06/01/16 1424 06/01/16 2101 06/02/16 0229  TROPONINI 0.05* 0.07* 0.08*   ------------------------------------------------------------------------------------------------------------------ Invalid input(s):  POCBNP    Assessment & Plan   66 year old male with history of peripheral vascular disease status post stenting and PTCA in July, CAD, can make kidney disease stage III and recent hospitalization for right foot nonhealing ulcer with osteomyelitis who presents with chest pain and sepsis.  1. Sepsis: Patient presents with fever and tachypnea Source his foot, mri confirms worsening om,  seen vascular surg bka  Blood cultures no growth so far  continue Zosyn  2. Chest pain/pressure with history of CAD recent NSTEMI: Continue telemetry and monitoring of troponins Assessment cardiology did recommend both aspirin and Plavix that discussed with vascular surgery Dr. Gwenevere Abbot states that Plavix will be okay to use with aspirin just normal loading of Plavix I will restart Plavix  3. Accelerated hypertension: Continue Coreg and add ACE inhibitor blood pressure stable   4. Chronic kidney disease stage III continue monitor stable   5. Diabetes: Hold oral medications. Glycemia improved we will monitor blood sugar today of continues to be elevated I will resume his Lantus at a lower dose  6.Tobacco dependence: Counseling done   7. Anemia of chronic disease: Patient has received PRBCs during last hospitalization. Continue to monitor CBC.  8. 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      Code Status Orders        Start     Ordered   06/01/16 1542  Full code  Continuous     06/01/16 1541    Code Status History    Date Active Date Inactive Code Status Order ID Comments User Context   05/03/2016 10:54 PM 05/11/2016  6:08 PM Full Code [id number]  Enedina Finner, MD Inpatient   03/27/2016  7:34 PM 03/29/2016  5:47 PM Full Code [id number]  Enedina Finner, MD Inpatient   02/14/2016  6:10 PM 02/15/2016  5:03 PM Full Code [id number]  Alford Highland, MD ED   01/13/2016  9:42 AM 01/17/2016 11:47 PM Full Code [id number]  Eugenie Norrie, NP ED           Consults  Podiatry and cardiology  DVT Prophylaxis  Lovenox   Lab Results  Component Value Date   PLT 324 06/03/2016     Time Spent in minutes   Greater than 50% of time spent in care coordination and counseling patient regarding the condition and plan of care.   Auburn Bilberry M.D on 06/04/2016 at 12:51 PM  Between 7am to 6pm - Pager - [phone]  After 6pm go to [email or web address] - password [email or web address] [facility]  [facility] [place]  [facility]   Office  [phone]

## 2016-06-04 NOTE — Progress Notes (Signed)
[facility]  Daily Progress Note   Subjective  - * No surgery date entered *  Patient on the phone with the wife.  Doing OK today.  No new complaints Understands that he will need BKA.  This is scheduled for Wednesday  Objective Vitals:   06/03/16 1357 06/03/16 2014 06/04/16 0431 06/04/16 1213  BP: (!) 124/59 120/60 (!) 130/57 (!) 139/58  Pulse: 74 73 71 70  Resp:  18 18 14   Temp:  98.1 F (36.7 C) 98.9 F (37.2 C)   TempSrc:  Oral Oral   SpO2:  97% 99% 100%  Weight:   63.1 kg (139 lb 1.6 oz)   Height:        Intake/Output Summary (Last 24 hours) at 06/04/16 1627 Last data filed at 06/04/16 0949  Gross per 24 hour  Intake              717 ml  Output              500 ml  Net              217 ml    PULM  CTAB CV  RRR VASC  1-2+ right PT pulse is present, foot is warm.  Wound persists   Laboratory CBC    Component Value Date/Time   WBC 7.8 06/03/2016 0534   HGB 9.6 (L) 06/03/2016 0534   HGB 14.1 09/03/2012 0552   HCT 27.6 (L) 06/03/2016 0534   HCT CANCELED 02/01/2016 1528   PLT 324 06/03/2016 0534   PLT CANCELED 02/01/2016 1528    BMET    Component Value Date/Time   NA 138 06/04/2016 0956   NA 144 02/01/2016 1528   NA 129 (L) 09/03/2012 0552   K 3.5 06/04/2016 0956   K 4.4 09/03/2012 0552   CL 110 06/04/2016 0956   CL 99 09/03/2012 0552   CO2 23 06/04/2016 0956   CO2 20 (L) 09/03/2012 0552   GLUCOSE 159 (H) 06/04/2016 0956   GLUCOSE 680 (HH) 09/03/2012 0552   BUN 27 (H) 06/04/2016 0956   BUN 23 02/01/2016 1528   BUN 22 (H) 09/03/2012 0552   CREATININE 1.36 (H) 06/04/2016 0956   CREATININE 1.73 (H) 09/03/2012 0552   CALCIUM 7.9 (L) 06/04/2016 0956   CALCIUM 9.5 09/03/2012 0552   GFRNONAA 53 (L) 06/04/2016 0956   GFRNONAA 42 (L) 09/03/2012 0552   GFRAA >60 06/04/2016 0956   GFRAA 49 (L) 09/03/2012 0552    Assessment/Planning:    Right foot osteomyelitis. Foot not salvageable despite improved perfusion.    Podiatry  has recommended amputation and patient is agreeable.   For BKA Wednesday   Risks and benefits discussed    Festus BarrenJason Dew  06/04/2016, 4:27 PM

## 2016-06-04 NOTE — Consult Note (Signed)
[facility] Infectious Disease     Reason for Consult: Osteomyelitis    Referring Physician: Estanislado Spire Date of Admission:  06/01/2016   Active Problems:   Sepsis ([place])   Stable angina ([place])   Anemia   Cellulitis   Hypertensive urgency   HPI: Damon [name] is a 66 y.o. male readmitted with worsening infection of R foot following surgical I and D of gangrene and OM of R foot on Jan 30th and angioplasty Jan 29th. He was dced on IV zosyn and has been on this however wound doing poorly and admitted with fevers.   Past Medical History:  Diagnosis Date  . CAD (coronary artery disease)    a. 01/2016 MI/PCI: LM nl, LAD 173md (3.25 x 28 Xience DES), 40d, LCX 30ost, OM1 95 (staged - 2.75 x 18 Xience Alpine DES), OM2/3 min irregs, RCA 828m . Chronic combined systolic and diastolic CHF (congestive heart failure) ([facility]   a. 01/2016 Echo: Ef 25-30%, Gr1 DD, mild AI, mildly dil LA;  b. 02/2016 Echo: EF 30-35%, apical AK, antsept and ant HK, nl RV fxn.  . CKD (chronic kidney disease), stage III   . COPD (chronic obstructive pulmonary disease) ([facility]  . Diabetes mellitus without complication ([facility]  . Diverticulitis   . Essential hypertension   . Ischemic cardiomyopathy    a. 01/2016 Echo: Ef 25-30%;  b. 02/2016 Echo: EF 30-35%.  . Marland KitchenBBB (left bundle branch block)   . PAD (peripheral artery disease) ([facility]   a. 10/2015 s/p PTA and stenting of the RCIA, LCIA, and PTA of the LEIA ([place]); b. 04/2016 PTA or R tibioperoneal trunk & prox PT, PTA/DBA of R Pop and SFA, Viabahn covered stent x 2 to the R SFA and pop (66m46m 25 cm & 66mm32m15cm).  . Tobacco abuse    Past Surgical History:  Procedure Laterality Date  . CARDIAC CATHETERIZATION N/A 01/13/2016   Procedure: LEFT HEART CATH AND CORONARY ANGIOGRAPHY;  Surgeon: MuhaWellington Hampshire;  [facility];  Service: Cardiovascular;  Laterality: N/A;  . CARDIAC CATHETERIZATION N/A 01/13/2016   Procedure: Coronary Stent Intervention;   Surgeon: MuhaWellington Hampshire;  [facility];  Service: Cardiovascular;  Laterality: N/A;  . CARDIAC CATHETERIZATION N/A 01/16/2016   Procedure: Left Heart Cath and Coronary Angiography;  Surgeon: MuhaWellington Hampshire;  [facility];  Service: Cardiovascular;  Laterality: N/A;  . IRRIGATION AND DEBRIDEMENT FOOT Right 05/08/2016   Procedure: IRRIGATION AND DEBRIDEMENT FOOT WITH PLACEMENT OF ANTIBIOTIC BEADS;  Surgeon: ToddSharlotte AlamoM;  [facility];  Service: Podiatry;  Laterality: Right;  . NECK SURGERY    . PERIPHERAL VASCULAR CATHETERIZATION Left 11/01/2015   Procedure: Lower Extremity Angiography;  Surgeon: GregKatha Cabal;  [facility];  Service: Cardiovascular;  Laterality: Left;  . PERIPHERAL VASCULAR CATHETERIZATION Right 05/07/2016   Procedure: Lower Extremity Angiography;  Surgeon: JasoAlgernon Huxley;  [facility];  Service: Cardiovascular;  Laterality: Right;   Social History  Substance Use Topics  . Smoking status: Current Every Day Smoker    Packs/day: 0.50    Years: 15.00    Types: Cigarettes  . Smokeless tobacco: Current User  . Alcohol use No   Family History  Problem Relation Age of Onset  . Intracerebral hemorrhage Mother   . Diabetes Mother   . Cancer Mother   . Diabetes Father  Allergies: No Known Allergies  Current antibiotics: Antibiotics Given (last 72 hours)    Date/Time Action Medication Dose Rate   06/01/16 1636 Given   piperacillin-tazobactam (ZOSYN) IVPB 3.375 g 3.375 g 12.5 mL/hr   06/01/16 2322 Given   piperacillin-tazobactam (ZOSYN) IVPB 3.375 g 3.375 g 12.5 mL/hr   06/02/16 0636 Given   piperacillin-tazobactam (ZOSYN) IVPB 3.375 g 3.375 g 12.5 mL/hr   06/02/16 1411 Given   piperacillin-tazobactam (ZOSYN) IVPB 3.375 g 3.375 g 12.5 mL/hr   06/02/16 2128 Given   piperacillin-tazobactam (ZOSYN) IVPB 3.375 g 3.375 g 12.5 mL/hr   06/03/16 0536 Given    piperacillin-tazobactam (ZOSYN) IVPB 3.375 g 3.375 g 12.5 mL/hr   06/03/16 1357 Given   piperacillin-tazobactam (ZOSYN) IVPB 3.375 g 3.375 g 12.5 mL/hr   06/03/16 2218 Given   piperacillin-tazobactam (ZOSYN) IVPB 3.375 g 3.375 g 12.5 mL/hr   06/04/16 0544 Given   piperacillin-tazobactam (ZOSYN) IVPB 3.375 g 3.375 g 12.5 mL/hr      MEDICATIONS: . aspirin EC  81 mg Oral Daily  . atorvastatin  80 mg Oral q1800  . carvedilol  12.5 mg Oral BID WC  . clopidogrel  75 mg Oral Daily  . enoxaparin (LOVENOX) injection  40 mg Subcutaneous Q24H  . feeding supplement (GLUCERNA SHAKE)  237 mL Oral TID BM  . ferrous sulfate  325 mg Oral TID WC  . hydrALAZINE  25 mg Oral Q8H  . insulin aspart  0-5 Units Subcutaneous QHS  . insulin aspart  0-9 Units Subcutaneous TID WC  . lisinopril  40 mg Oral Daily  . pantoprazole  40 mg Oral Daily  . piperacillin-tazobactam (ZOSYN)  IV  3.375 g Intravenous Q8H  . senna-docusate  1 tablet Oral Daily  . sodium chloride flush  10-40 mL Intracatheter Q12H    Review of Systems - 11 systems reviewed and negative per HPI   OBJECTIVE: Temp:  [98.1 F (36.7 C)-98.9 F (37.2 C)] 98.9 F (37.2 C) (02/26 0431) Pulse Rate:  [70-74] 70 (02/26 1213) Resp:  [14-18] 14 (02/26 1213) BP: (120-139)/(57-60) 139/58 (02/26 1213) SpO2:  [97 %-100 %] 100 % (02/26 1213) Weight:  [63.1 kg (139 lb 1.6 oz)] 63.1 kg (139 lb 1.6 oz) (02/26 0431) Physical Exam  Constitutional: He is oriented to person, place, and time. Chronically ill appearing HENT: anicteric Mouth/Throat: Oropharynx is clear and moist. No oropharyngeal exudate. Poor dentition Cardiovascular: Normal rate, regular rhythm and normal heart sounds. Pulmonary/Chest: Effort normal and breath sounds normal. No respiratory distress. He has no wheezes.  Abdominal: Soft. Bowel sounds are normal. He exhibits no distension. There is no tenderness.  Lymphadenopathy:  He has no cervical adenopathy. Neurological: He is alert and  oriented to person, place, and time.  Skin: R foot with extensive lateral wound with exposed bone and necrotic tissue with abx beads in place.   Psychiatric: He has a normal mood and affect. His behavior is normal.     LABS: Results for orders placed or performed during the hospital encounter of 06/01/16 (from the past 48 hour(s))  Glucose, capillary     Status: None   Collection Time: 06/02/16  4:32 PM  Result Value Ref Range   Glucose-Capillary 90 65 - 99 mg/dL  Glucose, capillary     Status: Abnormal   Collection Time: 06/02/16  8:39 PM  Result Value Ref Range   Glucose-Capillary 110 (H) 65 - 99 mg/dL   Comment 1 Notify RN    Comment 2 Document in Chart  CBC     Status: Abnormal   Collection Time: 06/03/16  5:34 AM  Result Value Ref Range   WBC 7.8 3.8 - 10.6 K/uL   RBC 3.07 (L) 4.40 - 5.90 MIL/uL   Hemoglobin 9.6 (L) 13.0 - 18.0 g/dL   HCT 27.6 (L) 40.0 - 52.0 %   MCV 90.0 80.0 - 100.0 fL   MCH 31.3 26.0 - 34.0 pg   MCHC 34.7 32.0 - 36.0 g/dL   RDW 16.4 (H) 11.5 - 14.5 %   Platelets 324 150 - 440 K/uL  Basic metabolic panel     Status: Abnormal   Collection Time: 06/03/16  5:34 AM  Result Value Ref Range   Sodium 139 135 - 145 mmol/L   Potassium 3.1 (L) 3.5 - 5.1 mmol/L   Chloride 108 101 - 111 mmol/L   CO2 23 22 - 32 mmol/L   Glucose, Bld 53 (L) 65 - 99 mg/dL   BUN 20 6 - 20 mg/dL   Creatinine, Ser 1.25 (H) 0.61 - 1.24 mg/dL   Calcium 8.2 (L) 8.9 - 10.3 mg/dL   GFR calc non Af Amer 59 (L) >60 mL/min   GFR calc Af Amer >60 >60 mL/min    Comment: (NOTE) The eGFR has been calculated using the CKD EPI equation. This calculation has not been validated in all clinical situations. eGFR's persistently <60 mL/min signify possible Chronic Kidney Disease.    Anion gap 8 5 - 15  Glucose, capillary     Status: Abnormal   Collection Time: 06/03/16  7:52 AM  Result Value Ref Range   Glucose-Capillary 45 (L) 65 - 99 mg/dL  Glucose, capillary     Status: None   Collection  Time: 06/03/16  8:22 AM  Result Value Ref Range   Glucose-Capillary 82 65 - 99 mg/dL  Glucose, capillary     Status: Abnormal   Collection Time: 06/03/16 11:54 AM  Result Value Ref Range   Glucose-Capillary 123 (H) 65 - 99 mg/dL  Glucose, capillary     Status: Abnormal   Collection Time: 06/03/16  5:02 PM  Result Value Ref Range   Glucose-Capillary 138 (H) 65 - 99 mg/dL  Glucose, capillary     Status: Abnormal   Collection Time: 06/03/16  9:12 PM  Result Value Ref Range   Glucose-Capillary 143 (H) 65 - 99 mg/dL  Glucose, capillary     Status: Abnormal   Collection Time: 06/04/16  7:51 AM  Result Value Ref Range   Glucose-Capillary 172 (H) 65 - 99 mg/dL  Basic metabolic panel     Status: Abnormal   Collection Time: 06/04/16  9:56 AM  Result Value Ref Range   Sodium 138 135 - 145 mmol/L   Potassium 3.5 3.5 - 5.1 mmol/L   Chloride 110 101 - 111 mmol/L   CO2 23 22 - 32 mmol/L   Glucose, Bld 159 (H) 65 - 99 mg/dL   BUN 27 (H) 6 - 20 mg/dL   Creatinine, Ser 1.36 (H) 0.61 - 1.24 mg/dL   Calcium 7.9 (L) 8.9 - 10.3 mg/dL   GFR calc non Af Amer 53 (L) >60 mL/min   GFR calc Af Amer >60 >60 mL/min    Comment: (NOTE) The eGFR has been calculated using the CKD EPI equation. This calculation has not been validated in all clinical situations. eGFR's persistently <60 mL/min signify possible Chronic Kidney Disease.    Anion gap 5 5 - 15  Glucose, capillary  Status: Abnormal   Collection Time: 06/04/16 12:14 PM  Result Value Ref Range   Glucose-Capillary 150 (H) 65 - 99 mg/dL   No components found for: ESR, C REACTIVE PROTEIN MICRO: Recent Results (from the past 720 hour(s))  Aerobic/Anaerobic Culture (surgical/deep wound)     Status: None   Collection Time: 05/08/16  1:41 PM  Result Value Ref Range Status   Specimen Description AMP  Final   Special Requests NONE  Final   Gram Stain   Final    RARE WBC PRESENT, PREDOMINANTLY MONONUCLEAR RARE GRAM POSITIVE COCCI    Culture    Final    MODERATE GROUP B STREP(S.AGALACTIAE)ISOLATED TESTING AGAINST S. AGALACTIAE NOT ROUTINELY PERFORMED DUE TO PREDICTABILITY OF AMP/PEN/VAN SUSCEPTIBILITY. RARE ENTEROBACTER SPECIES NO ANAEROBES ISOLATED Performed at [facility], [place] [address] [place], [place] [address]    Report Status 05/13/2016 FINAL  Final   Organism ID, Bacteria ENTEROBACTER SPECIES  Final      Susceptibility   Enterobacter species - MIC*    CEFAZOLIN >=64 RESISTANT Resistant     CEFEPIME <=1 SENSITIVE Sensitive     CEFTAZIDIME <=1 SENSITIVE Sensitive     CEFTRIAXONE <=1 SENSITIVE Sensitive     CIPROFLOXACIN <=0.25 SENSITIVE Sensitive     GENTAMICIN <=1 SENSITIVE Sensitive     IMIPENEM <=0.25 SENSITIVE Sensitive     TRIMETH/SULFA <=20 SENSITIVE Sensitive     PIP/TAZO <=4 SENSITIVE Sensitive     * RARE ENTEROBACTER SPECIES  Culture, blood (Routine X 2) w Reflex to ID Panel     Status: None (Preliminary result)   Collection Time: 06/01/16  4:11 PM  Result Value Ref Range Status   Specimen Description BLOOD LEFT ASSIST CONTROL  Final   Special Requests   Final    BOTTLES DRAWN AEROBIC AND ANAEROBIC ANA11ML AER10ML   Culture NO GROWTH 3 DAYS  Final   Report Status PENDING  Incomplete  Culture, blood (Routine X 2) w Reflex to ID Panel     Status: None (Preliminary result)   Collection Time: 06/01/16  4:12 PM  Result Value Ref Range Status   Specimen Description BLOOD ALINE  Final   Special Requests   Final    BOTTLES DRAWN AEROBIC AND ANAEROBIC ANA10ML AER10ML   Culture NO GROWTH 3 DAYS  Final   Report Status PENDING  Incomplete  MRSA PCR Screening     Status: None   Collection Time: 06/01/16  4:13 PM  Result Value Ref Range Status   MRSA by PCR NEGATIVE NEGATIVE Final    Comment:        The GeneXpert MRSA Assay (FDA approved for NASAL specimens only), is one component of a comprehensive MRSA colonization surveillance program. It is not intended to diagnose MRSA infection nor to guide  or monitor treatment for MRSA infections.     IMAGING: Dg Chest 2 View  Result Date: 06/01/2016 CLINICAL DATA:  Chest pain EXAM: CHEST  2 VIEW COMPARISON:  05/03/2016 FINDINGS: Right upper extremity PICC with tip at the SVC level. Stable borderline cardiomegaly. There is no edema, consolidation, effusion, or pneumothorax. Stable mid thoracic degenerative disc narrowing and endplate sclerosis. IMPRESSION: 1. Stable exam.  No evidence of active disease. 2. Right upper extremity PICC in good position. Electronically Signed   By: Monte Fantasia M.D.   On: 06/01/2016 10:06   Mr Foot Right Wo Contrast  Result Date: 06/02/2016 CLINICAL DATA:  Patient with history of an ulceration on the lateral aspect of  the right foot over the last few months. Status post resection of the proximal fifth metatarsal, debridement of necrotic tissue and placement of antibiotic impregnated beads 05/08/2016. Patient admitted to the hospital 06/01/2016 with fever. EXAM: MRI OF THE RIGHT FOREFOOT WITHOUT CONTRAST TECHNIQUE: Multiplanar, multisequence MR imaging of the right foot was performed. No intravenous contrast was administered. COMPARISON:  MRI right foot 05/08/2016.  Plain films this same day. FINDINGS: Surgical wound is seen over the lateral foot with underlying antibiotic impregnated beads in place. The proximal fifth metatarsal has been resected. No marrow edema is seen in the remnant of the fifth metatarsal. There is marrow edema throughout the cuboid and lateral cuneiform which is more extensive and intense than that seen on the prior MRI are. Marrow edema is also seen in the proximal 2 cm of the second and third metatarsals, markedly increased compared the prior MRI. Milder degree of marrow edema is seen in subchondral bone of the navicular and medial and middle cuneiforms and subchondral bone of the base of the first metatarsal. Edema throughout intrinsic musculature the foot does not appear markedly changed compared  to the prior MRI. Subcutaneous edema is seen about the ankle and foot. No focal fluid collection is seen. IMPRESSION: Marrow edema in the lateral cuneiform, cuboid and proximal second and third metatarsals is more extensive and intense than that seen on the prior MRI and worrisome for osteomyelitis. Milder degree of marrow edema mainly in subchondral bone of the proximal fourth metatarsal, navicular, medial and middle cuneiforms and first metatarsal also appears increased compared to the prior study and could be due to osteomyelitis or reactive change. Status post resection of the proximal fifth metatarsal with antibiotic impregnated beads in place. Signal in the remnant of the fifth metatarsal is normal. Large wound at the surgical site.  Negative for abscess. Edema throughout all intrinsic musculature the foot is unchanged and could be inflammatory or due to early atrophy. Electronically Signed   By: Inge Rise M.D.   On: 06/02/2016 13:57   Mr Foot Right Wo Contrast  Result Date: 05/08/2016 CLINICAL DATA:  Open wound involving the base of the fifth metatarsal. Diabetic. EXAM: MRI OF THE RIGHT FOREFOOT WITHOUT CONTRAST TECHNIQUE: Multiplanar, multisequence MR imaging of the right foot was performed. No intravenous contrast was administered. COMPARISON:  Radiographs 05/03/2016 FINDINGS: Diffuse subcutaneous soft tissue swelling/edema/ fluid consistent with cellulitis. There is diffuse edema like signal abnormality in the foot musculature consistent with myofasciitis. Large open wound is noted on the lateral aspect of the foot in the region of the base of the fifth metatarsal. There is some underlying fluid. There is also fluid tracking along the plantar aspect of the fourth metatarsal which could be an abscess. Difficult to be certain without contrast. T2 and STIR signal abnormality in the base of the fifth metatarsal and also along the lateral margin of the cuboid suspicious for osteomyelitis. No definite  MR findings for septic arthritis. IMPRESSION: 1. Large open wound along the lateral aspect of the midfoot in the region of the base of the fifth metatarsal. This appears to extend right down to the bone. 2. Diffuse cellulitis and myofasciitis. 3. Fluid collection tracking from the area of the open wound along the plantar aspect of the fourth metatarsal is worrisome for an abscess. 4. Findings suspicious for osteomyelitis involving the base of the fifth metatarsal and the lateral aspect of the cuboid. Electronically Signed   By: Marijo Sanes M.D.   On: 05/08/2016 11:10  Dg Foot Complete Right  Result Date: 06/02/2016 CLINICAL DATA:  Patient with history of an ulceration on the lateral aspect of the right foot over the last few months. Status post resection of the proximal fifth metatarsal, debridement of necrotic tissue and placement of antibiotic impregnated beads 05/08/2016. Patient admitted to the hospital 06/01/2016 with fever. EXAM: RIGHT FOOT COMPLETE - 3+ VIEW COMPARISON:  Plain films left foot 05/03/2016. MRI left ankle 05/08/2016. FINDINGS: Postoperative change of resection of the proximal fifth metatarsal and placement of antibiotic impregnated beads is identified. Soft tissues about the foot appear mildly swollen. Skin ulceration versus marked thinning of cutaneous tissues over the surgical site is identified. No soft tissue gas collection or bony destructive change. IMPRESSION: Status post resection of proximal fifth metatarsal and placement of antibiotic impregnated beads. No plain film evidence of osteomyelitis or other acute bony abnormality. Skin over the patient's surgical site appears markedly thinned. Electronically Signed   By: Inge Rise M.D.   On: 06/02/2016 13:01    Assessment:   ARLEY SALAMONE is a 66 y.o. male with DM, PVD, smoking readmitted with worsening R foot infection despite prior I and D, angioplasty and several weeks IV zosyn He will need BKA    Recommendations Continue zosyn until surgery then would proceed with usual perioperative antibiotics for a BKA. If has BKA no need for prolonged IV abx Thank you very much for allowing me to participate in the care of this patient. Please call with questions.   Cheral Marker. Ola Spurr, MD

## 2016-06-05 LAB — BASIC METABOLIC PANEL
Anion gap: 5 (ref 5–15)
BUN: 27 mg/dL — ABNORMAL HIGH (ref 6–20)
CALCIUM: 8 mg/dL — AB (ref 8.9–10.3)
CO2: 23 mmol/L (ref 22–32)
Chloride: 109 mmol/L (ref 101–111)
Creatinine, Ser: 1.18 mg/dL (ref 0.61–1.24)
GFR calc non Af Amer: >60 mL/min (ref >60)
Glucose, Bld: 160 mg/dL — ABNORMAL HIGH (ref 65–99)
Potassium: 3.6 mmol/L (ref 3.5–5.1)
SODIUM: 137 mmol/L (ref 135–145)

## 2016-06-05 LAB — CBC
HCT: 25.7 % — ABNORMAL LOW (ref 40.0–52.0)
Hemoglobin: 8.6 g/dL — ABNORMAL LOW (ref 13.0–18.0)
MCH: 30.5 pg (ref 26.0–34.0)
MCHC: 33.5 g/dL (ref 32.0–36.0)
MCV: 90.9 fL (ref 80.0–100.0)
PLATELETS: 287 10*3/uL (ref 150–440)
RBC: 2.82 MIL/uL — AB (ref 4.40–5.90)
RDW: 16.3 % — ABNORMAL HIGH (ref 11.5–14.5)
WBC: 5.4 10*3/uL (ref 3.8–10.6)

## 2016-06-05 LAB — GLUCOSE, CAPILLARY
GLUCOSE-CAPILLARY: 229 mg/dL — AB (ref 65–99)
GLUCOSE-CAPILLARY: 160 mg/dL — AB (ref 65–99)
Glucose-Capillary: 156 mg/dL — ABNORMAL HIGH (ref 65–99)
Glucose-Capillary: 140 mg/dL — ABNORMAL HIGH (ref 65–99)

## 2016-06-05 NOTE — Care Management (Signed)
[facility].  Admitted with sx concerning for sepsis.For BKA within next 24 hours

## 2016-06-05 NOTE — Care Management Important Message (Signed)
Important Message  Patient Details  Name: Damon [name] MRN: [id number] Date of Birth: April 05, 1951   Medicare Important Message Given:  Yes Given signed copy   Eber HongGreene, Nasiah Lehenbauer R, RN 06/05/2016, 3:08 PM

## 2016-06-05 NOTE — Progress Notes (Signed)
[employer] [facility] [place] [facility]                                                                                                                                                                                  Patient Demographics   Damon [name], is a 66 y.o. male, DOB - January 15, 1951, ZOX:[id number]  Admit date - 06/01/2016   Admitting Physician Adrian SaranSital Mody, MD  Outpatient Primary MD for the patient is [name], [name], MD   LOS - 4  Subjective: Patient is awaiting surgery for BKA tomorrow  Review of Systems:   CONSTITUTIONAL: No documented   fever. No fatigue, weakness. No weight gain, no weight loss.  EYES: No blurry or double vision.  ENT: No tinnitus. No postnasal drip. No redness of the oropharynx.  RESPIRATORY: No cough, no wheeze, no hemoptysis. No dyspnea.  CARDIOVASCULAR: No chest pain. No orthopnea. No palpitations. No syncope.  GASTROINTESTINAL: No nausea, no vomiting or diarrhea. No abdominal pain. No melena or hematochezia.  GENITOURINARY: No dysuria or hematuria.  ENDOCRINE: No polyuria or nocturia. No heat or cold intolerance.  HEMATOLOGY: No anemia. No bruising. No bleeding.  INTEGUMENTARY: No rashes. No lesions.  MUSCULOSKELETAL: No arthritis. No swelling. No gout.  NEUROLOGIC: No numbness, tingling, or ataxia. No seizure-type activity.  PSYCHIATRIC: No anxiety. No insomnia. No ADD.    Vitals:   Vitals:   06/04/16 2005 06/05/16 0409 06/05/16 0849 06/05/16 1222  BP: (!) 111/50 (!) 151/59 (!) 145/58 (!) 147/62  Pulse: 68 67 76 69  Resp: 18 18 (!) 28   Temp: 98.8 F (37.1 C) 98.9 F (37.2 C) 98.5 F (36.9 C) 98.8 F (37.1 C)  TempSrc: Oral Oral Oral Oral  SpO2: 100% 100% 99% 99%  Weight:      Height:        Wt Readings from Last 3 Encounters:  06/04/16 139 lb 1.6 oz (63.1 kg)  05/03/16 160 lb (72.6 kg)  05/02/16 165 lb (74.8 kg)     Intake/Output Summary (Last 24 hours) at 06/05/16 1343 Last data filed at 06/05/16 1120  Gross per 24  hour  Intake              697 ml  Output              376 ml  Net              321 ml    Physical Exam:   GENERAL: Pleasant-appearing in no apparent distress.  HEAD, EYES, EARS, NOSE AND THROAT: Atraumatic, normocephalic. Extraocular muscles are intact. Pupils equal and reactive to light. Sclerae anicteric. No conjunctival injection. No oro-pharyngeal erythema.  NECK: Supple.  There is no jugular venous distention. No bruits, no lymphadenopathy, no thyromegaly.  HEART: Regular rate and rhythm,. No murmurs, no rubs, no clicks.  LUNGS: Clear to auscultation bilaterally. No rales or rhonchi. No wheezes.  ABDOMEN: Soft, flat, nontender, nondistended. Has good bowel sounds. No hepatosplenomegaly appreciated.  EXTREMITIES: No evidence of any cyanosis, clubbing, or peripheral edema.  +2 pedal and radial pulses bilaterally. Right foot dressing in place NEUROLOGIC: The patient is alert, awake, and oriented x3 with no focal motor or sensory deficits appreciated bilaterally.  SKIN: Moist and warm with no rashes appreciated.  Psych: Not anxious, depressed LN: No inguinal LN enlargement    Antibiotics   Anti-infectives    Start     Dose/Rate Route Frequency Ordered Stop   06/01/16 1600  piperacillin-tazobactam (ZOSYN) IVPB 3.375 g     3.375 g 12.5 mL/hr over 240 Minutes Intravenous Every 8 hours 06/01/16 1549        Medications   Scheduled Meds: . aspirin EC  81 mg Oral Daily  . atorvastatin  80 mg Oral q1800  . carvedilol  12.5 mg Oral BID WC  . clopidogrel  75 mg Oral Daily  . enoxaparin (LOVENOX) injection  40 mg Subcutaneous Q24H  . feeding supplement (GLUCERNA SHAKE)  237 mL Oral TID BM  . ferrous sulfate  325 mg Oral TID WC  . hydrALAZINE  25 mg Oral Q8H  . insulin aspart  0-5 Units Subcutaneous QHS  . insulin aspart  0-9 Units Subcutaneous TID WC  . lisinopril  40 mg Oral Daily  . pantoprazole  40 mg Oral Daily  . piperacillin-tazobactam (ZOSYN)  IV  3.375 g Intravenous Q8H  .  senna-docusate  1 tablet Oral Daily  . sodium chloride flush  10-40 mL Intracatheter Q12H   Continuous Infusions: . sodium chloride     PRN Meds:.acetaminophen **OR** acetaminophen, hydrALAZINE, HYDROcodone-acetaminophen, ondansetron **OR** ondansetron (ZOFRAN) IV, oxyCODONE-acetaminophen, senna-docusate, sodium chloride flush   Data Review:   Micro Results Recent Results (from the past 240 hour(s))  Culture, blood (Routine X 2) w Reflex to ID Panel     Status: None (Preliminary result)   Collection Time: 06/01/16  4:11 PM  Result Value Ref Range Status   Specimen Description BLOOD LEFT ASSIST CONTROL  Final   Special Requests   Final    BOTTLES DRAWN AEROBIC AND ANAEROBIC ANA11ML AER10ML   Culture NO GROWTH 4 DAYS  Final   Report Status PENDING  Incomplete  Culture, blood (Routine X 2) w Reflex to ID Panel     Status: None (Preliminary result)   Collection Time: 06/01/16  4:12 PM  Result Value Ref Range Status   Specimen Description BLOOD ALINE  Final   Special Requests   Final    BOTTLES DRAWN AEROBIC AND ANAEROBIC ANA10ML AER10ML   Culture NO GROWTH 4 DAYS  Final   Report Status PENDING  Incomplete  MRSA PCR Screening     Status: None   Collection Time: 06/01/16  4:13 PM  Result Value Ref Range Status   MRSA by PCR NEGATIVE NEGATIVE Final    Comment:        The GeneXpert MRSA Assay (FDA approved for NASAL specimens only), is one component of a comprehensive MRSA colonization surveillance program. It is not intended to diagnose MRSA infection nor to guide or monitor treatment for MRSA infections.     Radiology Reports Dg Chest 2 View  Result Date: 06/01/2016 CLINICAL DATA:  Chest pain EXAM: CHEST  2 VIEW COMPARISON:  05/03/2016 FINDINGS: Right upper extremity PICC with tip at the SVC level. Stable borderline cardiomegaly. There is no edema, consolidation, effusion, or pneumothorax. Stable mid thoracic degenerative disc narrowing and endplate sclerosis. IMPRESSION:  1. Stable exam.  No evidence of active disease. 2. Right upper extremity PICC in good position. Electronically Signed   By: Marnee Spring M.D.   On: 06/01/2016 10:06   Mr Foot Right Wo Contrast  Result Date: 06/02/2016 CLINICAL DATA:  Patient with history of an ulceration on the lateral aspect of the right foot over the last few months. Status post resection of the proximal fifth metatarsal, debridement of necrotic tissue and placement of antibiotic impregnated beads 05/08/2016. Patient admitted to the hospital 06/01/2016 with fever. EXAM: MRI OF THE RIGHT FOREFOOT WITHOUT CONTRAST TECHNIQUE: Multiplanar, multisequence MR imaging of the right foot was performed. No intravenous contrast was administered. COMPARISON:  MRI right foot 05/08/2016.  Plain films this same day. FINDINGS: Surgical wound is seen over the lateral foot with underlying antibiotic impregnated beads in place. The proximal fifth metatarsal has been resected. No marrow edema is seen in the remnant of the fifth metatarsal. There is marrow edema throughout the cuboid and lateral cuneiform which is more extensive and intense than that seen on the prior MRI are. Marrow edema is also seen in the proximal 2 cm of the second and third metatarsals, markedly increased compared the prior MRI. Milder degree of marrow edema is seen in subchondral bone of the navicular and medial and middle cuneiforms and subchondral bone of the base of the first metatarsal. Edema throughout intrinsic musculature the foot does not appear markedly changed compared to the prior MRI. Subcutaneous edema is seen about the ankle and foot. No focal fluid collection is seen. IMPRESSION: Marrow edema in the lateral cuneiform, cuboid and proximal second and third metatarsals is more extensive and intense than that seen on the prior MRI and worrisome for osteomyelitis. Milder degree of marrow edema mainly in subchondral bone of the proximal fourth metatarsal, navicular, medial and  middle cuneiforms and first metatarsal also appears increased compared to the prior study and could be due to osteomyelitis or reactive change. Status post resection of the proximal fifth metatarsal with antibiotic impregnated beads in place. Signal in the remnant of the fifth metatarsal is normal. Large wound at the surgical site.  Negative for abscess. Edema throughout all intrinsic musculature the foot is unchanged and could be inflammatory or due to early atrophy. Electronically Signed   By: Drusilla Kanner M.D.   On: 06/02/2016 13:57   Mr Foot Right Wo Contrast  Result Date: 05/08/2016 CLINICAL DATA:  Open wound involving the base of the fifth metatarsal. Diabetic. EXAM: MRI OF THE RIGHT FOREFOOT WITHOUT CONTRAST TECHNIQUE: Multiplanar, multisequence MR imaging of the right foot was performed. No intravenous contrast was administered. COMPARISON:  Radiographs 05/03/2016 FINDINGS: Diffuse subcutaneous soft tissue swelling/edema/ fluid consistent with cellulitis. There is diffuse edema like signal abnormality in the foot musculature consistent with myofasciitis. Large open wound is noted on the lateral aspect of the foot in the region of the base of the fifth metatarsal. There is some underlying fluid. There is also fluid tracking along the plantar aspect of the fourth metatarsal which could be an abscess. Difficult to be certain without contrast. T2 and STIR signal abnormality in the base of the fifth metatarsal and also along the lateral margin of the cuboid suspicious for osteomyelitis. No definite MR findings for septic arthritis. IMPRESSION:  1. Large open wound along the lateral aspect of the midfoot in the region of the base of the fifth metatarsal. This appears to extend right down to the bone. 2. Diffuse cellulitis and myofasciitis. 3. Fluid collection tracking from the area of the open wound along the plantar aspect of the fourth metatarsal is worrisome for an abscess. 4. Findings suspicious for  osteomyelitis involving the base of the fifth metatarsal and the lateral aspect of the cuboid. Electronically Signed   By: Rudie Meyer M.D.   On: 05/08/2016 11:10   Dg Foot Complete Right  Result Date: 06/02/2016 CLINICAL DATA:  Patient with history of an ulceration on the lateral aspect of the right foot over the last few months. Status post resection of the proximal fifth metatarsal, debridement of necrotic tissue and placement of antibiotic impregnated beads 05/08/2016. Patient admitted to the hospital 06/01/2016 with fever. EXAM: RIGHT FOOT COMPLETE - 3+ VIEW COMPARISON:  Plain films left foot 05/03/2016. MRI left ankle 05/08/2016. FINDINGS: Postoperative change of resection of the proximal fifth metatarsal and placement of antibiotic impregnated beads is identified. Soft tissues about the foot appear mildly swollen. Skin ulceration versus marked thinning of cutaneous tissues over the surgical site is identified. No soft tissue gas collection or bony destructive change. IMPRESSION: Status post resection of proximal fifth metatarsal and placement of antibiotic impregnated beads. No plain film evidence of osteomyelitis or other acute bony abnormality. Skin over the patient's surgical site appears markedly thinned. Electronically Signed   By: Drusilla Kanner M.D.   On: 06/02/2016 13:01     CBC  Recent Labs Lab 06/01/16 1003 06/02/16 0229 06/03/16 0534 06/05/16 0500  WBC 5.9 6.2 7.8 5.4  HGB 9.3* 9.8* 9.6* 8.6*  HCT 26.5* 27.1* 27.6* 25.7*  PLT 327 323 324 287  MCV 90.2 89.8 90.0 90.9  MCH 31.7 32.5 31.3 30.5  MCHC 35.1 36.2* 34.7 33.5  RDW 16.7* 16.6* 16.4* 16.3*  LYMPHSABS 0.4*  --   --   --   MONOABS 0.6  --   --   --   EOSABS 0.1  --   --   --   BASOSABS 0.0  --   --   --     Chemistries   Recent Labs Lab 06/01/16 1003 06/02/16 0229 06/03/16 0534 06/04/16 0956 06/05/16 0500  NA 138 137 139 138 137  K 3.5 3.2* 3.1* 3.5 3.6  CL 108 106 108 110 109  CO2 22 22 23 23 23    GLUCOSE 135* 119* 53* 159* 160*  BUN 12 16 20  27* 27*  CREATININE 1.08 1.25* 1.25* 1.36* 1.18  CALCIUM 8.4* 8.4* 8.2* 7.9* 8.0*  AST 35  --   --   --   --   ALT 23  --   --   --   --   ALKPHOS 92  --   --   --   --   BILITOT 0.4  --   --   --   --    ------------------------------------------------------------------------------------------------------------------ estimated creatinine clearance is 56.4 mL/min (by C-G formula based on SCr of 1.18 mg/dL). ------------------------------------------------------------------------------------------------------------------ No results for input(s): HGBA1C in the last 72 hours. ------------------------------------------------------------------------------------------------------------------ No results for input(s): CHOL, HDL, LDLCALC, TRIG, CHOLHDL, LDLDIRECT in the last 72 hours. ------------------------------------------------------------------------------------------------------------------ No results for input(s): TSH, T4TOTAL, T3FREE, THYROIDAB in the last 72 hours.  Invalid input(s): FREET3 ------------------------------------------------------------------------------------------------------------------ No results for input(s): VITAMINB12, FOLATE, FERRITIN, TIBC, IRON, RETICCTPCT in the last 72 hours.  Coagulation profile No results  for input(s): INR, PROTIME in the last 168 hours.  No results for input(s): DDIMER in the last 72 hours.  Cardiac Enzymes  Recent Labs Lab 06/01/16 1424 06/01/16 2101 06/02/16 0229  TROPONINI 0.05* 0.07* 0.08*   ------------------------------------------------------------------------------------------------------------------ Invalid input(s): POCBNP    Assessment & Plan   65 year old male with history of peripheral vascular disease status post stenting and PTCA in July, CAD, can make kidney disease stage III and recent hospitalization for right foot nonhealing ulcer with osteomyelitis who presents  with chest pain and sepsis.  1. Sepsis: Patient presents with fever and tachypnea bka continue iv antibiotics   2. Chest pain/pressure with history of CAD recent NSTEMI: continue aspirin and Plavix   3. Accelerated hypertension: Continue Coreg and add ACE inhibitor blood pressure stable   4. Chronic kidney disease stage III continue monitor stable   5. Diabetes:   Continue ssi  6.Tobacco dependence: Counseling done   7. Anemia of chronic disease: Patient has received PRBCs during last hospitalization. Continue to monitor CBC.  8. 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      Code Status Orders        Start     Ordered   06/01/16 1542  Full code  Continuous     06/01/16 1541    Code Status History    Date Active Date Inactive Code Status Order ID Comments User Context   05/03/2016 10:54 PM 05/11/2016  6:08 PM Full Code [id number]  Enedina Finner, MD Inpatient   03/27/2016  7:34 PM 03/29/2016  5:47 PM Full Code [id number]  Enedina Finner, MD Inpatient   02/14/2016  6:10 PM 02/15/2016  5:03 PM Full Code [id number]  Alford Highland, MD ED   01/13/2016  9:42 AM 01/17/2016 11:47 PM Full Code [id number]  Eugenie Norrie, NP ED           Consults  Podiatry and cardiology  DVT Prophylaxis  Lovenox   Lab Results  Component Value Date   PLT 287 06/05/2016     Time Spent in minutes   Greater than 50% of time spent in care coordination and counseling patient regarding the condition and plan of care.   Auburn Bilberry M.D on 06/05/2016 at 1:43 PM  Between 7am to 6pm - Pager - [phone]  After 6pm go to [email or web address] - password [email or web address] [facility]  [facility] [place] [facility]   Office  [phone]

## 2016-06-05 NOTE — Progress Notes (Signed)
3 beats of V tach. Dr. Allena Katzpatel informed

## 2016-06-05 NOTE — Clinical Social Work Note (Signed)
CSW was informed that patient is from [facility] [place] and plans to return for short term rehab.  Patient is supposed to have surgery on Wednesday per bedside nurse, CSW to continue to follow patient's progress throughout discharge planning.  Ervin KnackEric R. Jozette Castrellon, MSW, Theresia MajorsLCSWA [phone]  06/05/2016 5:16 PM

## 2016-06-06 ENCOUNTER — Inpatient Hospital Stay: Payer: Commercial Managed Care - PPO | Admitting: Anesthesiology

## 2016-06-06 ENCOUNTER — Encounter
Admission: EM | Disposition: A | Discharge status: Skilled Nursing Facility | Payer: Self-pay | Source: Home / Self Care | Attending: Internal Medicine

## 2016-06-06 DIAGNOSIS — I70261 Atherosclerosis of native arteries of extremities with gangrene, right leg: Secondary | ICD-10-CM

## 2016-06-06 HISTORY — PX: AMPUTATION: SHX166

## 2016-06-06 LAB — CULTURE, BLOOD (ROUTINE X 2)
CULTURE: NO GROWTH
Culture: NO GROWTH

## 2016-06-06 LAB — CBC
HEMATOCRIT: 26.2 % — AB (ref 40.0–52.0)
Hemoglobin: 8.9 g/dL — ABNORMAL LOW (ref 13.0–18.0)
MCH: 30.7 pg (ref 26.0–34.0)
MCHC: 33.9 g/dL (ref 32.0–36.0)
MCV: 90.5 fL (ref 80.0–100.0)
Platelets: 295 10*3/uL (ref 150–440)
RBC: 2.89 MIL/uL — AB (ref 4.40–5.90)
RDW: 16.5 % — AB (ref 11.5–14.5)
WBC: 5.6 10*3/uL (ref 3.8–10.6)

## 2016-06-06 LAB — TYPE AND SCREEN
ABO/RH(D): O POS
ANTIBODY SCREEN: NEGATIVE

## 2016-06-06 LAB — BASIC METABOLIC PANEL
ANION GAP: 8 (ref 5–15)
BUN: 22 mg/dL — ABNORMAL HIGH (ref 6–20)
CALCIUM: 8.3 mg/dL — AB (ref 8.9–10.3)
CO2: 22 mmol/L (ref 22–32)
Chloride: 109 mmol/L (ref 101–111)
Creatinine, Ser: 1.22 mg/dL (ref 0.61–1.24)
GLUCOSE: 163 mg/dL — AB (ref 65–99)
POTASSIUM: 4 mmol/L (ref 3.5–5.1)
Sodium: 139 mmol/L (ref 135–145)

## 2016-06-06 LAB — GLUCOSE, CAPILLARY
GLUCOSE-CAPILLARY: 159 mg/dL — AB (ref 65–99)
GLUCOSE-CAPILLARY: 146 mg/dL — AB (ref 65–99)
Glucose-Capillary: 134 mg/dL — ABNORMAL HIGH (ref 65–99)
Glucose-Capillary: 128 mg/dL — ABNORMAL HIGH (ref 65–99)
Glucose-Capillary: 144 mg/dL — ABNORMAL HIGH (ref 65–99)

## 2016-06-06 SURGERY — AMPUTATION BELOW KNEE
Anesthesia: General | Laterality: Right | Wound class: Clean

## 2016-06-06 SURGERY — AMPUTATION BELOW KNEE
Anesthesia: General | Laterality: Right

## 2016-06-06 MED ORDER — IPRATROPIUM-ALBUTEROL 0.5-2.5 (3) MG/3ML IN SOLN
3.0000 mL | Freq: Once | RESPIRATORY_TRACT | Status: AC
  Administered 2016-06-06: 3 mL via RESPIRATORY_TRACT

## 2016-06-06 MED ORDER — FENTANYL CITRATE (PF) 100 MCG/2ML IJ SOLN
INTRAMUSCULAR | Status: AC
  Filled 2016-06-06: qty 2

## 2016-06-06 MED ORDER — FENTANYL CITRATE (PF) 100 MCG/2ML IJ SOLN
INTRAMUSCULAR | Status: DC | PRN
  Administered 2016-06-06: 100 ug via INTRAVENOUS

## 2016-06-06 MED ORDER — PHENYLEPHRINE HCL 10 MG/ML IJ SOLN
INTRAMUSCULAR | Status: DC | PRN
  Administered 2016-06-06: 100 ug via INTRAVENOUS
  Administered 2016-06-06: 200 ug via INTRAVENOUS

## 2016-06-06 MED ORDER — CEFAZOLIN SODIUM 1 G IJ SOLR
INTRAMUSCULAR | Status: AC
  Filled 2016-06-06: qty 10

## 2016-06-06 MED ORDER — MIDAZOLAM HCL 2 MG/2ML IJ SOLN
INTRAMUSCULAR | Status: AC
  Filled 2016-06-06: qty 2

## 2016-06-06 MED ORDER — MIDAZOLAM HCL 2 MG/2ML IJ SOLN
INTRAMUSCULAR | Status: DC | PRN
  Administered 2016-06-06 (×2): 1 mg via INTRAVENOUS

## 2016-06-06 MED ORDER — SODIUM CHLORIDE 0.9 % IJ SOLN
INTRAMUSCULAR | Status: AC
  Filled 2016-06-06: qty 10

## 2016-06-06 MED ORDER — OXYCODONE HCL ER 10 MG PO T12A
10.0000 mg | EXTENDED_RELEASE_TABLET | Freq: Two times a day (BID) | ORAL | Status: DC
  Administered 2016-06-06 – 2016-06-07 (×2): 10 mg via ORAL
  Filled 2016-06-06 (×2): qty 1

## 2016-06-06 MED ORDER — FENTANYL CITRATE (PF) 100 MCG/2ML IJ SOLN
INTRAMUSCULAR | Status: AC
  Administered 2016-06-06: 25 ug via INTRAVENOUS
  Filled 2016-06-06: qty 2

## 2016-06-06 MED ORDER — ONDANSETRON HCL 4 MG/2ML IJ SOLN
INTRAMUSCULAR | Status: AC
  Filled 2016-06-06: qty 2

## 2016-06-06 MED ORDER — OXYCODONE HCL 5 MG PO TABS: 5.0000 mg | ORAL_TABLET | Freq: Once | ORAL | Status: DC | PRN

## 2016-06-06 MED ORDER — ROCURONIUM BROMIDE 50 MG/5ML IV SOLN
INTRAVENOUS | Status: AC
  Filled 2016-06-06: qty 1

## 2016-06-06 MED ORDER — OXYCODONE HCL 5 MG/5ML PO SOLN: 5.0000 mg | Freq: Once | ORAL | Status: DC | PRN

## 2016-06-06 MED ORDER — LIDOCAINE HCL (PF) 2 % IJ SOLN
INTRAMUSCULAR | Status: AC
  Filled 2016-06-06: qty 2

## 2016-06-06 MED ORDER — PIPERACILLIN-TAZOBACTAM 3.375 G IVPB
INTRAVENOUS | Status: AC
  Administered 2016-06-06: 3.375 g via INTRAVENOUS
  Filled 2016-06-06: qty 50

## 2016-06-06 MED ORDER — SUGAMMADEX SODIUM 200 MG/2ML IV SOLN
INTRAVENOUS | Status: DC | PRN
  Administered 2016-06-06: 150 mg via INTRAVENOUS

## 2016-06-06 MED ORDER — MORPHINE SULFATE (PF) 4 MG/ML IV SOLN
2.0000 mg | INTRAVENOUS | Status: DC | PRN
  Administered 2016-06-06: 2 mg via INTRAVENOUS
  Filled 2016-06-06: qty 1

## 2016-06-06 MED ORDER — SUCCINYLCHOLINE CHLORIDE 20 MG/ML IJ SOLN
INTRAMUSCULAR | Status: DC | PRN
  Administered 2016-06-06: 100 mg via INTRAVENOUS

## 2016-06-06 MED ORDER — IPRATROPIUM-ALBUTEROL 0.5-2.5 (3) MG/3ML IN SOLN
RESPIRATORY_TRACT | Status: AC
  Administered 2016-06-06: 3 mL via RESPIRATORY_TRACT
  Filled 2016-06-06: qty 3

## 2016-06-06 MED ORDER — CEFAZOLIN SODIUM 1 G IJ SOLR
INTRAMUSCULAR | Status: DC | PRN
  Administered 2016-06-06: 1 g via INTRAMUSCULAR

## 2016-06-06 MED ORDER — SODIUM CHLORIDE 0.9 % IJ SOLN
INTRAMUSCULAR | Status: AC
  Filled 2016-06-06: qty 30

## 2016-06-06 MED ORDER — PROPOFOL 10 MG/ML IV BOLUS
INTRAVENOUS | Status: AC
  Filled 2016-06-06: qty 20

## 2016-06-06 MED ORDER — PREGABALIN 50 MG PO CAPS
100.0000 mg | ORAL_CAPSULE | Freq: Two times a day (BID) | ORAL | Status: DC
  Administered 2016-06-06 – 2016-06-11 (×10): 100 mg via ORAL
  Filled 2016-06-06 (×10): qty 2

## 2016-06-06 MED ORDER — PROPOFOL 10 MG/ML IV BOLUS
INTRAVENOUS | Status: DC | PRN
  Administered 2016-06-06: 30 mg via INTRAVENOUS
  Administered 2016-06-06: 100 mg via INTRAVENOUS

## 2016-06-06 MED ORDER — ROCURONIUM BROMIDE 100 MG/10ML IV SOLN
INTRAVENOUS | Status: DC | PRN
  Administered 2016-06-06: 20 mg via INTRAVENOUS

## 2016-06-06 MED ORDER — LIDOCAINE HCL (CARDIAC) 20 MG/ML IV SOLN
INTRAVENOUS | Status: DC | PRN
  Administered 2016-06-06: 60 mg via INTRAVENOUS

## 2016-06-06 MED ORDER — VASOPRESSIN 20 UNIT/ML IV SOLN
INTRAVENOUS | Status: DC | PRN
  Administered 2016-06-06: 2 [IU] via INTRAVENOUS

## 2016-06-06 MED ORDER — FENTANYL CITRATE (PF) 100 MCG/2ML IJ SOLN
25.0000 ug | INTRAMUSCULAR | Status: DC | PRN
  Administered 2016-06-06 (×2): 25 ug via INTRAVENOUS

## 2016-06-06 MED ORDER — EPHEDRINE SULFATE 50 MG/ML IJ SOLN
INTRAMUSCULAR | Status: DC | PRN
  Administered 2016-06-06 (×3): 10 mg via INTRAVENOUS
  Administered 2016-06-06: 5 mg via INTRAVENOUS

## 2016-06-06 MED ORDER — ONDANSETRON HCL 4 MG/2ML IJ SOLN
INTRAMUSCULAR | Status: DC | PRN
  Administered 2016-06-06: 4 mg via INTRAVENOUS

## 2016-06-06 MED ORDER — SUGAMMADEX SODIUM 200 MG/2ML IV SOLN
INTRAVENOUS | Status: AC
  Filled 2016-06-06: qty 2

## 2016-06-06 SURGICAL SUPPLY — 40 items
BANDAGE ELASTIC 6 LF NS (GAUZE/BANDAGES/DRESSINGS) ×3 IMPLANT
BLADE SAGITTAL WIDE XTHICK NO (BLADE) ×3 IMPLANT
BNDG CMPR MED 5X6 ELC HKLP NS (GAUZE/BANDAGES/DRESSINGS) ×1
BNDG COHESIVE 4X5 TAN STRL (GAUZE/BANDAGES/DRESSINGS) ×3 IMPLANT
BNDG GAUZE 4.5X4.1 6PLY STRL (MISCELLANEOUS) ×6 IMPLANT
BRUSH SCRUB 4% CHG (MISCELLANEOUS) ×3 IMPLANT
CANISTER SUCT 1200ML W/VALVE (MISCELLANEOUS) ×3 IMPLANT
DRAIN PENROSE 1/4X12 LTX (DRAIN) ×3 IMPLANT
DURAPREP 26ML APPLICATOR (WOUND CARE) ×3 IMPLANT
ELECT CAUTERY BLADE 6.4 (BLADE) ×3 IMPLANT
ELECT REM PT RETURN 9FT ADLT (ELECTROSURGICAL) ×3
ELECTRODE REM PT RTRN 9FT ADLT (ELECTROSURGICAL) ×1 IMPLANT
GAUZE PETRO XEROFOAM 1X8 (MISCELLANEOUS) ×6 IMPLANT
GLOVE BIO SURGEON STRL SZ7 (GLOVE) ×8 IMPLANT
GLOVE BIOGEL PI IND STRL 6.5 (GLOVE) IMPLANT
GLOVE BIOGEL PI INDICATOR 6.5 (GLOVE) ×2
GLOVE INDICATOR 7.5 STRL GRN (GLOVE) ×5 IMPLANT
GLOVE PROTEXIS LATEX SZ 7.5 (GLOVE) ×3 IMPLANT
GLOVE SURG LATEX 7.5 PF (GLOVE) IMPLANT
GOWN STRL REUS W/ TWL LRG LVL3 (GOWN DISPOSABLE) ×2 IMPLANT
GOWN STRL REUS W/ TWL XL LVL3 (GOWN DISPOSABLE) ×1 IMPLANT
GOWN STRL REUS W/TWL LRG LVL3 (GOWN DISPOSABLE) ×9
GOWN STRL REUS W/TWL XL LVL3 (GOWN DISPOSABLE) ×3
HANDLE YANKAUER SUCT BULB TIP (MISCELLANEOUS) ×3 IMPLANT
KIT RM TURNOVER STRD PROC AR (KITS) ×3 IMPLANT
LABEL OR SOLS (LABEL) ×3 IMPLANT
NS IRRIG 1000ML POUR BTL (IV SOLUTION) ×3 IMPLANT
PACK EXTREMITY ARMC (MISCELLANEOUS) ×3 IMPLANT
PAD ABD DERMACEA PRESS 5X9 (GAUZE/BANDAGES/DRESSINGS) ×6 IMPLANT
PAD PREP 24X41 OB/GYN DISP (PERSONAL CARE ITEMS) ×3 IMPLANT
SPONGE LAP 18X18 5 PK (GAUZE/BANDAGES/DRESSINGS) ×3 IMPLANT
STAPLER SKIN PROX 35W (STAPLE) ×3 IMPLANT
STOCKINETTE M/LG 89821 (MISCELLANEOUS) ×3 IMPLANT
SUT SILK 2 0 (SUTURE) ×3
SUT SILK 2 0 SH (SUTURE) ×6 IMPLANT
SUT SILK 2-0 18XBRD TIE 12 (SUTURE) ×1 IMPLANT
SUT SILK 3 0 (SUTURE) ×3
SUT SILK 3-0 18XBRD TIE 12 (SUTURE) ×1 IMPLANT
SUT VIC AB 0 CT1 36 (SUTURE) ×10 IMPLANT
SUT VIC AB 2-0 CT1 (SUTURE) ×6 IMPLANT

## 2016-06-06 NOTE — Anesthesia Preprocedure Evaluation (Signed)
Anesthesia Evaluation  Patient identified by MRN, date of birth, ID band Patient awake    Reviewed: Allergy & Precautions, H&P , NPO status , Patient's Chart, lab work & pertinent test results  History of Anesthesia Complications Negative for: history of anesthetic complications  Airway Mallampati: III  TM Distance: >3 FB Neck ROM: limited    Dental  (+) Poor Dentition, Chipped, Missing   Pulmonary shortness of breath and with exertion, COPD, Current Smoker,    Pulmonary exam normal breath sounds clear to auscultation       Cardiovascular Exercise Tolerance: Good hypertension, (-) angina+ CAD, + Past MI, + Cardiac Stents, + Peripheral Vascular Disease and +CHF  Normal cardiovascular exam+ dysrhythmias  Rhythm:regular Rate:Normal     Neuro/Psych negative neurological ROS  negative psych ROS   GI/Hepatic negative GI ROS, Neg liver ROS, neg GERD  ,  Endo/Other  diabetes, Poorly Controlled, Type 2, Insulin Dependent  Renal/GU Renal disease     Musculoskeletal   Abdominal   Peds  Hematology negative hematology ROS (+)   Anesthesia Other Findings Past Medical History: No date: CKD (chronic kidney disease), stage III No date: COPD (chronic obstructive pulmonary disease) (* No date: Diabetes mellitus without complication (HCC) No date: Diverticulitis No date: Essential hypertension No date: MI (myocardial infarction) No date: PAD (peripheral artery disease) (HCC)     Comment: a. 10/2015 s/p PTA and stenting of the RCIA,               LCIA, and PTA of the [employer] Environmental manager). No date: Tobacco abuse  Past Surgical History: 01/13/2016: CARDIAC CATHETERIZATION N/A     Comment: Procedure: LEFT HEART CATH AND CORONARY               ANGIOGRAPHY;  Surgeon: Iran Ouch, MD;                Location: [facility] INVASIVE CV LAB;  Service:               Cardiovascular;  Laterality: N/A; 01/13/2016: CARDIAC CATHETERIZATION N/A      Comment: Procedure: Coronary Stent Intervention;                Surgeon: Iran Ouch, MD;  Location: [facility]               INVASIVE CV LAB;  Service: Cardiovascular;                Laterality: N/A; 01/16/2016: CARDIAC CATHETERIZATION N/A     Comment: Procedure: Left Heart Cath and Coronary               Angiography;  Surgeon: Iran Ouch, MD;                Location: [facility] INVASIVE CV LAB;  Service:               Cardiovascular;  Laterality: N/A; No date: NECK SURGERY 11/01/2015: PERIPHERAL VASCULAR CATHETERIZATION Left     Comment: Procedure: Lower Extremity Angiography;                Surgeon: Renford Dills, MD;  Location: [facility]              INVASIVE CV LAB;  Service: Cardiovascular;                Laterality: Left; 05/07/2016: PERIPHERAL VASCULAR CATHETERIZATION Right     Comment: Procedure: Lower Extremity Angiography;  Surgeon: Annice NeedyJason S Dew, MD;  [facility];  Service: Cardiovascular;                Laterality: Right;  BMI    Body Mass Index:  22.96 kg/m      Reproductive/Obstetrics negative OB ROS                             Anesthesia Physical  Anesthesia Plan  ASA: IV  Anesthesia Plan: General ETT   Post-op Pain Management:    Induction:   Airway Management Planned:   Additional Equipment:   Intra-op Plan:   Post-operative Plan:   Informed Consent: I have reviewed the patients History and Physical, chart, labs and discussed the procedure including the risks, benefits and alternatives for the proposed anesthesia with the patient or authorized representative who has indicated his/her understanding and acceptance.   Dental Advisory Given  Plan Discussed with: Anesthesiologist, CRNA and Surgeon  Anesthesia Plan Comments: (Patient informed that they are higher risk for complications from anesthesia during this procedure due to their medical history.  Patient voiced understanding. )         Anesthesia Quick Evaluation

## 2016-06-06 NOTE — Op Note (Signed)
   OPERATIVE NOTE   PROCEDURE: Right below-the-knee amputation  PRE-OPERATIVE DIAGNOSIS: Right foot osteomyelitis  POST-OPERATIVE DIAGNOSIS: same as above  SURGEON: Damon BarrenJason Anjanae Woehrle, MD  ASSISTANT(S): Dr. Renford DillsGregory G. Schnier, MD  ANESTHESIA: general  ESTIMATED BLOOD LOSS: 75 cc  FINDING(S): none  SPECIMEN(S):  Right below-the-knee amputation  INDICATIONS:   Damon [name] is a 66 y.o. male who presents with right leg gangrene.  The patient is scheduled for a right below-the-knee amputation.  I discussed in depth with the patient the risks, benefits, and alternatives to this procedure.  The patient is aware that the risk of this operation included but are not limited to:  bleeding, infection, myocardial infarction, stroke, death, failure to heal amputation wound, and possible need for more proximal amputation.  The patient is aware of the risks and agrees proceed forward with the procedure.  DESCRIPTION:  After full informed written consent was obtained from the patient, the patient was brought back to the operating room, and placed supine upon the operating table.  Prior to induction, the patient received IV antibiotics.  The patient was then prepped and draped in the standard fashion for a below-the-knee amputation.  After obtaining adequate anesthesia, the patient was prepped and draped in the standard fashion for a right below-the-knee amputation.  I marked out the anterior incision two finger breadths below the tibial tuberosity and then the marked out a posterior flap that was one third of the circumference of the calf in length.   I made the incisions for these flaps, and then dissected through the subcutaneous tissue, fascia, and muscle anteriorly.  I elevated  the periosteal tissue superiorly so that the tibia was about 3-4 cm shorter than the anterior skin flap.  I then transected the tibia with a power saw and then took a wedge off the tibia anteriorly with the power saw.  Then I smoothed  out the rough edges.  In a similar fashion, I cut back the fibula about two centimeters higher than the level of the tibia with a bone cutter.  I put a bone hook into the distal tibia and then used a large amputation knife to sharply develop a tissue plane through the muscle along the fibula.  In such fashion, the posterior flap was developed.  At this point, the specimen was passed off the field as the below-the-knee amputation.  At this point, I clamped all visibly bleeding arteries and veins using a combination of suture ligation with Silk suture and electrocautery.  Bleeding continued to be controlled with electrocautery and suture ligature.  The stump was washed off with sterile normal saline and no further active bleeding was noted.  I reapproximated the anterior and posterior fascia  with interrupted stitches of 0 Vicryl.  This was completed along the entire length of anterior and posterior fascia until there were no more loose space in the fascial line. I then placed a layer of 2-0 Vicryl sutures in the subcutaneous tissue. The skin was then  reapproximated with staples.  The stump was washed off and dried.  The incision was dressed with Xeroform and  then fluffs were applied.  Kerlix was wrapped around the leg and then gently an ACE wrap was applied.    COMPLICATIONS: none  CONDITION: stable   Damon [name]  06/06/2016, 3:57 PM    This note was created with Dragon Medical transcription system. Any errors in dictation are purely unintentional.

## 2016-06-06 NOTE — Progress Notes (Signed)
PT Cancellation Note  Patient Details Name: Damon [name] MRN: [id number] DOB: May 03, 1950   Cancelled Treatment:    Reason Eval/Treat Not Completed: Patient at procedure or test/unavailable; Pt currently in Pre-op, will attempt to see pt at a future date/time as appropriate.    Thomes Dinningavid Scott Lian Pounds 06/06/2016, 1:28 PM

## 2016-06-06 NOTE — Anesthesia Procedure Notes (Addendum)
Procedure Name: Intubation Date/Time: 06/06/2016 2:47 PM Performed by: Karoline CaldwellSTARR, Melvia Matousek Pre-anesthesia Checklist: Patient identified, Patient being monitored, Timeout performed, Emergency Drugs available and Suction available Patient Re-evaluated:Patient Re-evaluated prior to inductionOxygen Delivery Method: Circle system utilized Preoxygenation: Pre-oxygenation with 100% oxygen Intubation Type: IV induction Ventilation: Mask ventilation without difficulty and Oral airway inserted - appropriate to patient size Laryngoscope Size: McGraph and 3 Grade View: Grade I Tube type: Oral Tube size: 7.0 mm Number of attempts: 1 Airway Equipment and Method: Stylet Placement Confirmation: ETT inserted through vocal cords under direct vision,  positive ETCO2 and breath sounds checked- equal and bilateral Secured at: 21 cm Tube secured with: Tape Dental Injury: Teeth and Oropharynx as per pre-operative assessment

## 2016-06-06 NOTE — Anesthesia Post-op Follow-up Note (Cosign Needed)
Anesthesia QCDR form completed.        

## 2016-06-06 NOTE — Anesthesia Postprocedure Evaluation (Signed)
Anesthesia Post Note  Patient: Chaney BornJoel L Heyden  Procedure(s) Performed: Procedure(s) (LRB): AMPUTATION BELOW KNEE (Right)  Patient location during evaluation: PACU Anesthesia Type: General Level of consciousness: awake and alert Pain management: pain level controlled Vital Signs Assessment: post-procedure vital signs reviewed and stable Respiratory status: spontaneous breathing, nonlabored ventilation, respiratory function stable and patient connected to nasal cannula oxygen Cardiovascular status: blood pressure returned to baseline and stable Postop Assessment: no signs of nausea or vomiting Anesthetic complications: no     Last Vitals:  Vitals:   06/06/16 1646 06/06/16 1651  BP:  (!) 155/73  Pulse: 80 81  Resp: (!) 27 (!) 28  Temp:  36.4 C    Last Pain:  Vitals:   06/06/16 1651  TempSrc:   PainSc: 3                  Yevette EdwardsJames G Adams

## 2016-06-06 NOTE — Progress Notes (Signed)
Pt ready for surgery. 2nd bath given. Consent signed. scd's will accompany. Ns infusing at kvo. Pt has been npo since midight.

## 2016-06-06 NOTE — Progress Notes (Signed)
Pt accepted in transfer from pacu. Rt stump dressing clean dry and intact. Pt is a/o; family at bedside.picc line flushed; fluids completed. Dinner ordered. Med for moderate pain with good effect.

## 2016-06-06 NOTE — Progress Notes (Signed)
[employer] [facility] - [place] at [facility]                                                                                                                                                                                  Patient Demographics   Damon [name], is a 66 y.o. male, DOB - 12/24/1950, BJY:[id number]  Admit date - 06/01/2016   Admitting Physician Adrian Saran, MD  Outpatient Primary MD for the patient is [name], [name], MD   LOS - 5  Subjective: No compleaints,await bka   Review of Systems:   CONSTITUTIONAL: No documented   fever. No fatigue, weakness. No weight gain, no weight loss.  EYES: No blurry or double vision.  ENT: No tinnitus. No postnasal drip. No redness of the oropharynx.  RESPIRATORY: No cough, no wheeze, no hemoptysis. No dyspnea.  CARDIOVASCULAR: No chest pain. No orthopnea. No palpitations. No syncope.  GASTROINTESTINAL: No nausea, no vomiting or diarrhea. No abdominal pain. No melena or hematochezia.  GENITOURINARY: No dysuria or hematuria.  ENDOCRINE: No polyuria or nocturia. No heat or cold intolerance.  HEMATOLOGY: No anemia. No bruising. No bleeding.  INTEGUMENTARY: No rashes. No lesions.  MUSCULOSKELETAL: No arthritis. No swelling. No gout.  NEUROLOGIC: No numbness, tingling, or ataxia. No seizure-type activity.  PSYCHIATRIC: No anxiety. No insomnia. No ADD.    Vitals:   Vitals:   06/05/16 1222 06/05/16 1937 06/06/16 0646 06/06/16 0900  BP: (!) 147/62 (!) 129/52 (!) 156/65 (!) 149/59  Pulse: 69 64 71 71  Resp:  18  16  Temp: 98.8 F (37.1 C) 98.2 F (36.8 C) 98.7 F (37.1 C) 97.8 F (36.6 C)  TempSrc: Oral Oral Oral Oral  SpO2: 99% 100% 96% 98%  Weight:      Height:        Wt Readings from Last 3 Encounters:  06/04/16 139 lb 1.6 oz (63.1 kg)  05/03/16 160 lb (72.6 kg)  05/02/16 165 lb (74.8 kg)     Intake/Output Summary (Last 24 hours) at 06/06/16 1239 Last data filed at 06/06/16 0900  Gross per 24 hour  Intake               623 ml  Output              550 ml  Net               73 ml    Physical Exam:   GENERAL: Pleasant-appearing in no apparent distress.  HEAD, EYES, EARS, NOSE AND THROAT: Atraumatic, normocephalic. Extraocular muscles are intact. Pupils equal and reactive to light. Sclerae anicteric. No conjunctival injection. No oro-pharyngeal erythema.  NECK: Supple. There is no  jugular venous distention. No bruits, no lymphadenopathy, no thyromegaly.  HEART: Regular rate and rhythm,. No murmurs, no rubs, no clicks.  LUNGS: Clear to auscultation bilaterally. No rales or rhonchi. No wheezes.  ABDOMEN: Soft, flat, nontender, nondistended. Has good bowel sounds. No hepatosplenomegaly appreciated.  EXTREMITIES: No evidence of any cyanosis, clubbing, or peripheral edema.  +2 pedal and radial pulses bilaterally. Right foot dressing in place NEUROLOGIC: The patient is alert, awake, and oriented x3 with no focal motor or sensory deficits appreciated bilaterally.  SKIN: Moist and warm with no rashes appreciated.  Psych: Not anxious, depressed LN: No inguinal LN enlargement    Antibiotics   Anti-infectives    Start     Dose/Rate Route Frequency Ordered Stop   06/01/16 1600  piperacillin-tazobactam (ZOSYN) IVPB 3.375 g     3.375 g 12.5 mL/hr over 240 Minutes Intravenous Every 8 hours 06/01/16 1549        Medications   Scheduled Meds: . aspirin EC  81 mg Oral Daily  . atorvastatin  80 mg Oral q1800  . carvedilol  12.5 mg Oral BID WC  . clopidogrel  75 mg Oral Daily  . enoxaparin (LOVENOX) injection  40 mg Subcutaneous Q24H  . feeding supplement (GLUCERNA SHAKE)  237 mL Oral TID BM  . ferrous sulfate  325 mg Oral TID WC  . hydrALAZINE  25 mg Oral Q8H  . insulin aspart  0-5 Units Subcutaneous QHS  . insulin aspart  0-9 Units Subcutaneous TID WC  . lisinopril  40 mg Oral Daily  . pantoprazole  40 mg Oral Daily  . piperacillin-tazobactam (ZOSYN)  IV  3.375 g Intravenous Q8H  . senna-docusate  1 tablet  Oral Daily  . sodium chloride flush  10-40 mL Intracatheter Q12H   Continuous Infusions: . sodium chloride 20 mL/hr at 06/06/16 1000   PRN Meds:.acetaminophen **OR** acetaminophen, hydrALAZINE, HYDROcodone-acetaminophen, ondansetron **OR** ondansetron (ZOFRAN) IV, oxyCODONE-acetaminophen, senna-docusate, sodium chloride flush   Data Review:   Micro Results Recent Results (from the past 240 hour(s))  Culture, blood (Routine X 2) w Reflex to ID Panel     Status: None   Collection Time: 06/01/16  4:11 PM  Result Value Ref Range Status   Specimen Description BLOOD LEFT ASSIST CONTROL  Final   Special Requests   Final    BOTTLES DRAWN AEROBIC AND ANAEROBIC ANA11ML AER10ML   Culture NO GROWTH 5 DAYS  Final   Report Status 06/06/2016 FINAL  Final  Culture, blood (Routine X 2) w Reflex to ID Panel     Status: None   Collection Time: 06/01/16  4:12 PM  Result Value Ref Range Status   Specimen Description BLOOD ALINE  Final   Special Requests   Final    BOTTLES DRAWN AEROBIC AND ANAEROBIC ANA10ML AER10ML   Culture NO GROWTH 5 DAYS  Final   Report Status 06/06/2016 FINAL  Final  MRSA PCR Screening     Status: None   Collection Time: 06/01/16  4:13 PM  Result Value Ref Range Status   MRSA by PCR NEGATIVE NEGATIVE Final    Comment:        The GeneXpert MRSA Assay (FDA approved for NASAL specimens only), is one component of a comprehensive MRSA colonization surveillance program. It is not intended to diagnose MRSA infection nor to guide or monitor treatment for MRSA infections.     Radiology Reports Dg Chest 2 View  Result Date: 06/01/2016 CLINICAL DATA:  Chest pain EXAM: CHEST  2 VIEW  COMPARISON:  05/03/2016 FINDINGS: Right upper extremity PICC with tip at the SVC level. Stable borderline cardiomegaly. There is no edema, consolidation, effusion, or pneumothorax. Stable mid thoracic degenerative disc narrowing and endplate sclerosis. IMPRESSION: 1. Stable exam.  No evidence of  active disease. 2. Right upper extremity PICC in good position. Electronically Signed   By: Marnee Spring M.D.   On: 06/01/2016 10:06   Mr Foot Right Wo Contrast  Result Date: 06/02/2016 CLINICAL DATA:  Patient with history of an ulceration on the lateral aspect of the right foot over the last few months. Status post resection of the proximal fifth metatarsal, debridement of necrotic tissue and placement of antibiotic impregnated beads 05/08/2016. Patient admitted to the hospital 06/01/2016 with fever. EXAM: MRI OF THE RIGHT FOREFOOT WITHOUT CONTRAST TECHNIQUE: Multiplanar, multisequence MR imaging of the right foot was performed. No intravenous contrast was administered. COMPARISON:  MRI right foot 05/08/2016.  Plain films this same day. FINDINGS: Surgical wound is seen over the lateral foot with underlying antibiotic impregnated beads in place. The proximal fifth metatarsal has been resected. No marrow edema is seen in the remnant of the fifth metatarsal. There is marrow edema throughout the cuboid and lateral cuneiform which is more extensive and intense than that seen on the prior MRI are. Marrow edema is also seen in the proximal 2 cm of the second and third metatarsals, markedly increased compared the prior MRI. Milder degree of marrow edema is seen in subchondral bone of the navicular and medial and middle cuneiforms and subchondral bone of the base of the first metatarsal. Edema throughout intrinsic musculature the foot does not appear markedly changed compared to the prior MRI. Subcutaneous edema is seen about the ankle and foot. No focal fluid collection is seen. IMPRESSION: Marrow edema in the lateral cuneiform, cuboid and proximal second and third metatarsals is more extensive and intense than that seen on the prior MRI and worrisome for osteomyelitis. Milder degree of marrow edema mainly in subchondral bone of the proximal fourth metatarsal, navicular, medial and middle cuneiforms and first  metatarsal also appears increased compared to the prior study and could be due to osteomyelitis or reactive change. Status post resection of the proximal fifth metatarsal with antibiotic impregnated beads in place. Signal in the remnant of the fifth metatarsal is normal. Large wound at the surgical site.  Negative for abscess. Edema throughout all intrinsic musculature the foot is unchanged and could be inflammatory or due to early atrophy. Electronically Signed   By: Drusilla Kanner M.D.   On: 06/02/2016 13:57   Mr Foot Right Wo Contrast  Result Date: 05/08/2016 CLINICAL DATA:  Open wound involving the base of the fifth metatarsal. Diabetic. EXAM: MRI OF THE RIGHT FOREFOOT WITHOUT CONTRAST TECHNIQUE: Multiplanar, multisequence MR imaging of the right foot was performed. No intravenous contrast was administered. COMPARISON:  Radiographs 05/03/2016 FINDINGS: Diffuse subcutaneous soft tissue swelling/edema/ fluid consistent with cellulitis. There is diffuse edema like signal abnormality in the foot musculature consistent with myofasciitis. Large open wound is noted on the lateral aspect of the foot in the region of the base of the fifth metatarsal. There is some underlying fluid. There is also fluid tracking along the plantar aspect of the fourth metatarsal which could be an abscess. Difficult to be certain without contrast. T2 and STIR signal abnormality in the base of the fifth metatarsal and also along the lateral margin of the cuboid suspicious for osteomyelitis. No definite MR findings for septic arthritis. IMPRESSION: 1. Large  open wound along the lateral aspect of the midfoot in the region of the base of the fifth metatarsal. This appears to extend right down to the bone. 2. Diffuse cellulitis and myofasciitis. 3. Fluid collection tracking from the area of the open wound along the plantar aspect of the fourth metatarsal is worrisome for an abscess. 4. Findings suspicious for osteomyelitis involving the  base of the fifth metatarsal and the lateral aspect of the cuboid. Electronically Signed   By: Rudie Meyer M.D.   On: 05/08/2016 11:10   Dg Foot Complete Right  Result Date: 06/02/2016 CLINICAL DATA:  Patient with history of an ulceration on the lateral aspect of the right foot over the last few months. Status post resection of the proximal fifth metatarsal, debridement of necrotic tissue and placement of antibiotic impregnated beads 05/08/2016. Patient admitted to the hospital 06/01/2016 with fever. EXAM: RIGHT FOOT COMPLETE - 3+ VIEW COMPARISON:  Plain films left foot 05/03/2016. MRI left ankle 05/08/2016. FINDINGS: Postoperative change of resection of the proximal fifth metatarsal and placement of antibiotic impregnated beads is identified. Soft tissues about the foot appear mildly swollen. Skin ulceration versus marked thinning of cutaneous tissues over the surgical site is identified. No soft tissue gas collection or bony destructive change. IMPRESSION: Status post resection of proximal fifth metatarsal and placement of antibiotic impregnated beads. No plain film evidence of osteomyelitis or other acute bony abnormality. Skin over the patient's surgical site appears markedly thinned. Electronically Signed   By: Drusilla Kanner M.D.   On: 06/02/2016 13:01     CBC  Recent Labs Lab 06/01/16 1003 06/02/16 0229 06/03/16 0534 06/05/16 0500 06/06/16 0941  WBC 5.9 6.2 7.8 5.4 5.6  HGB 9.3* 9.8* 9.6* 8.6* 8.9*  HCT 26.5* 27.1* 27.6* 25.7* 26.2*  PLT 327 323 324 287 295  MCV 90.2 89.8 90.0 90.9 90.5  MCH 31.7 32.5 31.3 30.5 30.7  MCHC 35.1 36.2* 34.7 33.5 33.9  RDW 16.7* 16.6* 16.4* 16.3* 16.5*  LYMPHSABS 0.4*  --   --   --   --   MONOABS 0.6  --   --   --   --   EOSABS 0.1  --   --   --   --   BASOSABS 0.0  --   --   --   --     Chemistries   Recent Labs Lab 06/01/16 1003 06/02/16 0229 06/03/16 0534 06/04/16 0956 06/05/16 0500 06/06/16 0941  NA 138 137 139 138 137 139  K 3.5  3.2* 3.1* 3.5 3.6 4.0  CL 108 106 108 110 109 109  CO2 22 22 23 23 23 22   GLUCOSE 135* 119* 53* 159* 160* 163*  BUN 12 16 20  27* 27* 22*  CREATININE 1.08 1.25* 1.25* 1.36* 1.18 1.22  CALCIUM 8.4* 8.4* 8.2* 7.9* 8.0* 8.3*  AST 35  --   --   --   --   --   ALT 23  --   --   --   --   --   ALKPHOS 92  --   --   --   --   --   BILITOT 0.4  --   --   --   --   --    ------------------------------------------------------------------------------------------------------------------ estimated creatinine clearance is 54.6 mL/min (by C-G formula based on SCr of 1.22 mg/dL). ------------------------------------------------------------------------------------------------------------------ No results for input(s): HGBA1C in the last 72 hours. ------------------------------------------------------------------------------------------------------------------ No results for input(s): CHOL, HDL, LDLCALC, TRIG, CHOLHDL, LDLDIRECT in the  last 72 hours. ------------------------------------------------------------------------------------------------------------------ No results for input(s): TSH, T4TOTAL, T3FREE, THYROIDAB in the last 72 hours.  Invalid input(s): FREET3 ------------------------------------------------------------------------------------------------------------------ No results for input(s): VITAMINB12, FOLATE, FERRITIN, TIBC, IRON, RETICCTPCT in the last 72 hours.  Coagulation profile No results for input(s): INR, PROTIME in the last 168 hours.  No results for input(s): DDIMER in the last 72 hours.  Cardiac Enzymes  Recent Labs Lab 06/01/16 1424 06/01/16 2101 06/02/16 0229  TROPONINI 0.05* 0.07* 0.08*   ------------------------------------------------------------------------------------------------------------------ Invalid input(s): POCBNP    Assessment & Plan   66 year old male with history of peripheral vascular disease status post stenting and PTCA in July, CAD, can make  kidney disease stage III and recent hospitalization for right foot nonhealing ulcer with osteomyelitis who presents with chest pain and sepsis.  1. Sepsis: Patient presents with fever and tachypnea Source right foot bka continue iv antibiotics post procedure went can do short-term oral antibiotics   2. Chest pain/pressure with history of CAD recent NSTEMI: continue aspirin and Plavix will need to resume this post surgery  3. Accelerated hypertension: Continue Coreg and add ACE inhibitor blood pressure stable   4. Chronic kidney disease stage III continue monitor stable   5. Diabetes:   Continue ssi  6. Nicotine abuse patient recommended to stop smoking   7. Anemia of chronic disease: Patient has received PRBCs during last hospitalization. Continue to monitor CBC.  8. 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      Code Status Orders        Start     Ordered   06/01/16 1542  Full code  Continuous     06/01/16 1541    Code Status History    Date Active Date Inactive Code Status Order ID Comments User Context   05/03/2016 10:54 PM 05/11/2016  6:08 PM Full Code [id number]  Enedina FinnerSona Lurine Imel, MD Inpatient   03/27/2016  7:34 PM 03/29/2016  5:47 PM Full Code [id number]  Enedina FinnerSona Rynell Ciotti, MD Inpatient   02/14/2016  6:10 PM 02/15/2016  5:03 PM Full Code [id number]  Alford Highlandichard Wieting, MD ED   01/13/2016  9:42 AM 01/17/2016 11:47 PM Full Code [id number]  Eugenie Norrieana G Blakeney, NP ED           Consults  Podiatry and cardiology  DVT Prophylaxis  Lovenox   Lab Results  Component Value Date   PLT 295 06/06/2016     Time Spent in minutes  30min  Greater than 50% of time spent in care coordination and counseling patient regarding the condition and plan of care.   Auburn BilberryPATEL, Jazarah Capili M.D on 06/06/2016 at 12:39 PM  Between 7am to 6pm - Pager - [phone]  After 6pm go to [email or web address] - password [email or web address] [facility]  [facility]   Office  [phone]

## 2016-06-06 NOTE — H&P (Signed)
[facility] History & Physical Update  The patient was interviewed and re-examined.  The patient's previous History and Physical has been reviewed and is unchanged.  There is no change in the plan of care. We plan to proceed with the scheduled procedure.  Festus BarrenJason Briseidy Spark, MD  06/06/2016, 2:06 PM

## 2016-06-06 NOTE — Transfer of Care (Signed)
Immediate Anesthesia Transfer of Care Note  Patient: Damon [name]  Procedure(s) Performed: Procedure(s): AMPUTATION BELOW KNEE (Right)  Patient Location: PACU  Anesthesia Type:General  Level of Consciousness: sedated  Airway & Oxygen Therapy: Patient Spontanous Breathing and Patient connected to face mask oxygen  Post-op Assessment: Report given to RN and Post -op Vital signs reviewed and stable  Post vital signs: Reviewed and stable  Last Vitals:  Vitals:   06/06/16 1313 06/06/16 1606  BP: (!) 155/67 (!) 146/65  Pulse: 65 75  Resp: 16 (!) 31  Temp: 36.3 C 36.2 C    Complications: No apparent anesthesia complications

## 2016-06-07 ENCOUNTER — Encounter: Payer: Self-pay | Admitting: Vascular Surgery

## 2016-06-07 LAB — BASIC METABOLIC PANEL
Anion gap: 5 (ref 5–15)
BUN: 22 mg/dL — AB (ref 6–20)
CALCIUM: 8 mg/dL — AB (ref 8.9–10.3)
CHLORIDE: 110 mmol/L (ref 101–111)
CO2: 22 mmol/L (ref 22–32)
CREATININE: 1.16 mg/dL (ref 0.61–1.24)
GFR calc Af Amer: >60 mL/min (ref >60)
GFR calc non Af Amer: >60 mL/min (ref >60)
GLUCOSE: 157 mg/dL — AB (ref 65–99)
Potassium: 4 mmol/L (ref 3.5–5.1)
Sodium: 137 mmol/L (ref 135–145)

## 2016-06-07 LAB — GLUCOSE, CAPILLARY
GLUCOSE-CAPILLARY: 137 mg/dL — AB (ref 65–99)
Glucose-Capillary: 162 mg/dL — ABNORMAL HIGH (ref 65–99)
Glucose-Capillary: 126 mg/dL — ABNORMAL HIGH (ref 65–99)
Glucose-Capillary: 160 mg/dL — ABNORMAL HIGH (ref 65–99)

## 2016-06-07 LAB — CBC WITH DIFFERENTIAL/PLATELET
Basophils Absolute: 0 10*3/uL (ref 0–0.1)
Basophils Relative: 0 %
Eosinophils Absolute: 0.1 10*3/uL (ref 0–0.7)
Eosinophils Relative: 1 %
HEMATOCRIT: 25 % — AB (ref 40.0–52.0)
HEMOGLOBIN: 8.7 g/dL — AB (ref 13.0–18.0)
LYMPHS ABS: 1.1 10*3/uL (ref 1.0–3.6)
Lymphocytes Relative: 14 %
MCH: 31.2 pg (ref 26.0–34.0)
MCHC: 34.6 g/dL (ref 32.0–36.0)
MCV: 90.4 fL (ref 80.0–100.0)
MONO ABS: 0.6 10*3/uL (ref 0.2–1.0)
MONOS PCT: 8 %
NEUTROS ABS: 6.2 10*3/uL (ref 1.4–6.5)
NEUTROS PCT: 77 %
Platelets: 310 10*3/uL (ref 150–440)
RBC: 2.77 MIL/uL — ABNORMAL LOW (ref 4.40–5.90)
RDW: 16.6 % — AB (ref 11.5–14.5)
WBC: 8.1 10*3/uL (ref 3.8–10.6)

## 2016-06-07 MED ORDER — MORPHINE SULFATE (PF) 4 MG/ML IV SOLN: 2.0000 mg | INTRAVENOUS | Status: DC | PRN

## 2016-06-07 MED ORDER — AMOXICILLIN-POT CLAVULANATE 875-125 MG PO TABS
1.0000 | ORAL_TABLET | Freq: Two times a day (BID) | ORAL | Status: DC
  Administered 2016-06-07 – 2016-06-11 (×9): 1 via ORAL
  Filled 2016-06-07 (×9): qty 1

## 2016-06-07 NOTE — Progress Notes (Signed)
Pt transferred to room 157,  Report was given to Rosey Batheresa, Charity fundraiserN, pt's wife was notified.

## 2016-06-07 NOTE — Plan of Care (Signed)
Problem: Bowel/Gastric: Goal: Will not experience complications related to bowel motility Outcome: Progressing BM 2/28 after surgery

## 2016-06-07 NOTE — Evaluation (Signed)
Physical Therapy Evaluation Patient Details Name: Damon [name] DOBROWSKI MRN: [id number] DOB: [birth year] Today's Date: 06/07/2016   History of Present Illness  Patient is a 66 y.o. male admitted on 23 FEB with chest pain and fever. 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  PMH includes CKDIII, CAD, and PAD.    Clinical Impression  Pt presents with deficits in strength, transfers, mobility, gait, balance, and activity tolerance.  Pt lethargic and very slow to follow commands.  Pt found staring blankly into space several times during evaluation with multiple attempts needed for pt to answer a question or follow a command, nursing notified.  Pt required Max A for all bed mobility tasks and required mod A to maintain sitting at EOB.  Pt immediately lost balance posteriorly without evidence of righting reaction when assistance was removed.  Pt will benefit from PT services to address above deficits for decreased caregiver assistance upon discharge.     Follow Up Recommendations SNF    Equipment Recommendations  None recommended by PT    Recommendations for Other Services       Precautions / Restrictions Precautions Precautions: Fall Restrictions Weight Bearing Restrictions: Yes RLE Weight Bearing: Non weight bearing Other Position/Activity Restrictions: New RLE BKA      Mobility  Bed Mobility Overal bed mobility: Needs Assistance Bed Mobility: Supine to Sit;Sit to Supine     Supine to sit: Max assist Sit to supine: Max assist   General bed mobility comments: Pt required max A as well as max verbal and tactile cues for hand placement and sequencing  Transfers                 General transfer comment: Unable/unsafe to attempt  Ambulation/Gait             General Gait Details: Unable/unsafe to  attempt  Stairs            Wheelchair Mobility    Modified Rankin (Stroke Patients Only)       Balance Overall balance assessment: Needs assistance Sitting-balance support: Bilateral upper extremity supported Sitting balance-Leahy Scale: Poor Sitting balance - Comments: Mod A at all times to prevent posterior LOB in sitting       Standing balance comment: Unable/unsafe to attempt                             Pertinent Vitals/Pain Pain Assessment: 0-10 Pain Score: 8  Pain Location: RLE Pain Descriptors / Indicators: Aching Pain Intervention(s): Premedicated before session;Monitored during session;Limited activity within patient's tolerance    Home Living Family/patient expects to be discharged to:: Private residence Living Arrangements: Spouse/significant other Available Help at Discharge: Family;Available PRN/intermittently Type of Home: House Home Access: Stairs to enter Entrance Stairs-Rails: None Entrance Stairs-Number of Steps: 1 step up to porch and then one step into home Home Layout: One level Home Equipment: Environmental consultant - 2 wheels      Prior Function Level of Independence: Needs assistance   Gait / Transfers Assistance Needed: Patient modified independent with household distances and RW  ADL's / Homemaking Assistance Needed: Performed by family; family drives he and wife to appointments as needed  Comments: Mod indep with ADLs, limited household activity with RW, wife having increased difficulty caring for pt     Hand  Dominance   Dominant Hand: Right    Extremity/Trunk Assessment        Lower Extremity Assessment Lower Extremity Assessment: Generalized weakness       Communication   Communication: No difficulties  Cognition Arousal/Alertness: Lethargic Behavior During Therapy: Flat affect Overall Cognitive Status: Impaired/Different from baseline Area of Impairment: Attention;Following commands;Awareness                General Comments: Pt delayed in following 1-step commands    General Comments      Exercises Total Joint Exercises Ankle Circles/Pumps: AROM;AAROM;10 reps;15 reps;Left Quad Sets: AROM;Left;10 reps;5 reps Gluteal Sets: AROM;Both;10 reps;5 reps Heel Slides: AAROM;Left;10 reps Hip ABduction/ADduction: AAROM;Left;10 reps Straight Leg Raises: AAROM;Left;10 reps Other Exercises Other Exercises: HEP education with pt and spouse for L APs and QS and B GS x 10 each 5-6x/day   Assessment/Plan    PT Assessment Patient needs continued PT services  PT Problem List Decreased strength;Decreased activity tolerance;Decreased balance;Decreased mobility;Decreased knowledge of use of DME       PT Treatment Interventions DME instruction;Gait training;Functional mobility training;Neuromuscular re-education;Balance training;Therapeutic exercise;Therapeutic activities;Patient/family education    PT Goals (Current goals can be found in the Care Plan section)  Acute Rehab PT Goals Patient Stated Goal: To get up and take care of myself PT Goal Formulation: With patient Time For Goal Achievement: 06/20/16 Potential to Achieve Goals: Fair    Frequency 7X/week   Barriers to discharge Decreased caregiver support;Inaccessible home environment      Co-evaluation               End of Session Equipment Utilized During Treatment: Gait belt Activity Tolerance: Patient limited by fatigue;Patient limited by lethargy Patient left: in bed;with bed alarm set;with SCD's reapplied;with family/visitor present;with call bell/phone within reach Nurse Communication: Mobility status;Other (comment) (Nursing notified of pt's lethargy and decreased ability to follow commands) PT Visit Diagnosis: Muscle weakness (generalized) (M62.81);Difficulty in walking, not elsewhere classified (R26.2)         Time: 1400-1431 PT Time Calculation (min) (ACUTE ONLY): 31 min   Charges:   PT Evaluation $PT Re-evaluation:  1 Procedure PT Treatments $Therapeutic Exercise: 8-22 mins   PT G Codes:         Elly Modena. Scott Kelsey Edman PT, DPT 06/07/16, 3:45 PM

## 2016-06-07 NOTE — Progress Notes (Signed)
[facility]  Daily Progress Note   Subjective  - 1 Day Post-Op  No events overnight Labs reviewed Pain control adequate  Objective Vitals:   06/06/16 2108 06/07/16 0332 06/07/16 0806 06/07/16 1523  BP: (!) 152/72 (!) 153/62 (!) 159/71   Pulse: 86 85 89 88  Resp: 18 20 18    Temp: 98 F (36.7 C) 98 F (36.7 C) 98.4 F (36.9 C)   TempSrc: Oral Oral Oral   SpO2:  98% 98% 97%  Weight:      Height:        Intake/Output Summary (Last 24 hours) at 06/07/16 1626 Last data filed at 06/07/16 1500  Gross per 24 hour  Intake              385 ml  Output                0 ml  Net              385 ml    PULM  CTAB CV  RRR VASC  Dressing C/D/I  Laboratory CBC    Component Value Date/Time   WBC 8.1 06/07/2016 1053   HGB 8.7 (L) 06/07/2016 1053   HGB 14.1 09/03/2012 0552   HCT 25.0 (L) 06/07/2016 1053   HCT CANCELED 02/01/2016 1528   PLT 310 06/07/2016 1053   PLT CANCELED 02/01/2016 1528    BMET    Component Value Date/Time   NA 137 06/07/2016 1053   NA 144 02/01/2016 1528   NA 129 (L) 09/03/2012 0552   K 4.0 06/07/2016 1053   K 4.4 09/03/2012 0552   CL 110 06/07/2016 1053   CL 99 09/03/2012 0552   CO2 22 06/07/2016 1053   CO2 20 (L) 09/03/2012 0552   GLUCOSE 157 (H) 06/07/2016 1053   GLUCOSE 680 (HH) 09/03/2012 0552   BUN 22 (H) 06/07/2016 1053   BUN 23 02/01/2016 1528   BUN 22 (H) 09/03/2012 0552   CREATININE 1.16 06/07/2016 1053   CREATININE 1.73 (H) 09/03/2012 0552   CALCIUM 8.0 (L) 06/07/2016 1053   CALCIUM 9.5 09/03/2012 0552   GFRNONAA >60 06/07/2016 1053   GFRNONAA 42 (L) 09/03/2012 0552   GFRAA >60 06/07/2016 1053   GFRAA 49 (L) 09/03/2012 0552    Assessment/Planning: POD #1 s/p right BKA   Leave dressing on for two more days and then can remove and place Kerlex gauze wrap  PT for knee straightening and mobility  Will likely need rehab at discharge. Expect that in 2-3 days  Follow up in office in 3 weeks for wound  check and staple removal    Festus BarrenJason Dew  06/07/2016, 4:26 PM

## 2016-06-07 NOTE — Progress Notes (Addendum)
Plan is for patient to D/C back to [facility] and continue short term rehab when he is medically stable. Clinical Education officer, museum (CSW) contacted [employer] and made him aware that patient had a right BKA on yesterday. CSW sent PT notes to [facility] via [facility] in order for [facility] to get a new authorization from [facility]. Per [place] patient can return when stable. CSW met with patient and made him aware of above. Patient is in agreement with returning to [facility]. CSW will continue to follow and assist as needed.   [employer], LCSW [phone]

## 2016-06-07 NOTE — Progress Notes (Signed)
Patient noted to be lethargic this shift. Arousable to voice, forgetful. Nurse had to assist patient with eating lunch tray. MD notified.

## 2016-06-07 NOTE — Progress Notes (Signed)
Nutrition Follow-up  DOCUMENTATION CODES:   Severe malnutrition in context of chronic illness  INTERVENTION:  1. Glucerna Shake po TID, each supplement provides 220 kcal and 10 grams of protein  NUTRITION DIAGNOSIS:   Malnutrition related to chronic illness as evidenced by severe depletion of muscle mass, moderate depletion of body fat, moderate depletions of muscle mass, percent weight loss. -ongoing  GOAL:   Patient will meet greater than or equal to 90% of their needs -progressing  MONITOR:   PO intake, I & O's, Supplement acceptance, Labs, Weight trends  REASON FOR ASSESSMENT:   Consult Assessment of nutrition requirement/status  ASSESSMENT:   Damon [name]  is a 66 y.o. male with a known history of Enterobacter and group B strep right foot nonhealing ulcer with osteomyelitis currently on Zosyn, chronic kidney disease stage III, CAD and peripheral arterial disease who presents with chest pain and found to have a fever of 100.6  He is s/p BKA. Complains of pain, receiving medication, sleepy at this time. Had Eggs, bacon, toast for breakfast - states he ate "fair" documented PO 25% Continue glucerna, monitor PO intake. Labs and medications reviewed: CBGs WDL Iron, Senokot-S, Sliding Scale Novolog  Diet Order:  Diet Carb Modified Fluid consistency: Thin; Room service appropriate? Yes  Skin:  Wound (see comment) (R diabetic foot ulcer)  Last BM:  06/02/2016  Height:   Ht Readings from Last 1 Encounters:  06/01/16 5\' 10"  (1.778 m)    Weight:   Wt Readings from Last 1 Encounters:  06/04/16 139 lb 1.6 oz (63.1 kg)    Ideal Body Weight:  75.45 kg  BMI:  Body mass index is 19.96 kg/m.  Estimated Nutritional Needs:   Kcal:  1600-1900 calories  Protein:  74-87 gm  Fluid:  >/= 1.6L  EDUCATION NEEDS:   No education needs identified at this time  Dionne AnoWilliam M. Juliano Mceachin, MS, RD LDN Inpatient Clinical Dietitian Pager [phone]

## 2016-06-07 NOTE — Progress Notes (Signed)
[facility] - [place] at [facility]                                                                                                                                                                                  Patient Demographics   Damon [name], is a 66 y.o. male, DOB - 1950-05-31, ZOX:[id number]  Admit date - 06/01/2016   Admitting Physician Adrian Saran, MD  Outpatient Primary MD for the patient is [name], [name], MD   LOS - 6  Subjective: Patient had BK yesterday and currently complains of some pain in the leg but no other significant complaints  Review of Systems:   CONSTITUTIONAL: No documented   fever. No fatigue, weakness. No weight gain, no weight loss.  EYES: No blurry or double vision.  ENT: No tinnitus. No postnasal drip. No redness of the oropharynx.  RESPIRATORY: No cough, no wheeze, no hemoptysis. No dyspnea.  CARDIOVASCULAR: No chest pain. No orthopnea. No palpitations. No syncope.  GASTROINTESTINAL: No nausea, no vomiting or diarrhea. No abdominal pain. No melena or hematochezia.  GENITOURINARY: No dysuria or hematuria.  ENDOCRINE: No polyuria or nocturia. No heat or cold intolerance.  HEMATOLOGY: No anemia. No bruising. No bleeding.  INTEGUMENTARY: No rashes. No lesions.  MUSCULOSKELETAL: No arthritis. No swelling. No gout. Pain and leg due to surgery NEUROLOGIC: No numbness, tingling, or ataxia. No seizure-type activity.  PSYCHIATRIC: No anxiety. No insomnia. No ADD.    Vitals:   Vitals:   06/06/16 1935 06/06/16 2108 06/07/16 0332 06/07/16 0806  BP: (!) 149/72 (!) 152/72 (!) 153/62 (!) 159/71  Pulse: 83 86 85 89  Resp: 18 18 20 18   Temp: 97.8 F (36.6 C) 98 F (36.7 C) 98 F (36.7 C) 98.4 F (36.9 C)  TempSrc: Oral Oral Oral Oral  SpO2: 100%  98% 98%  Weight:      Height:        Wt Readings from Last 3 Encounters:  06/04/16 139 lb 1.6 oz (63.1 kg)  05/03/16 160 lb (72.6 kg)  05/02/16 165 lb (74.8 kg)     Intake/Output Summary  (Last 24 hours) at 06/07/16 1203 Last data filed at 06/07/16 0138  Gross per 24 hour  Intake              785 ml  Output               75 ml  Net              710 ml    Physical Exam:   GENERAL: Pleasant-appearing in no apparent distress.  HEAD, EYES, EARS, NOSE AND THROAT: Atraumatic, normocephalic. Extraocular muscles are intact. Pupils  equal and reactive to light. Sclerae anicteric. No conjunctival injection. No oro-pharyngeal erythema.  NECK: Supple. There is no jugular venous distention. No bruits, no lymphadenopathy, no thyromegaly.  HEART: Regular rate and rhythm,. No murmurs, no rubs, no clicks.  LUNGS: Clear to auscultation bilaterally. No rales or rhonchi. No wheezes.  ABDOMEN: Soft, flat, nontender, nondistended. Has good bowel sounds. No hepatosplenomegaly appreciated.  EXTREMITIES: No evidence of any cyanosis, clubbing, or peripheral edema.  Right leg status post BKA NEUROLOGIC: The patient is alert, awake, and oriented x3 with no focal motor or sensory deficits appreciated bilaterally.  SKIN: Moist and warm with no rashes appreciated.  Psych: Not anxious, depressed LN: No inguinal LN enlargement    Antibiotics   Anti-infectives    Start     Dose/Rate Route Frequency Ordered Stop   06/07/16 1200  amoxicillin-clavulanate (AUGMENTIN) 875-125 MG per tablet 1 tablet     1 tablet Oral Every 12 hours 06/07/16 1031     06/01/16 1600  piperacillin-tazobactam (ZOSYN) IVPB 3.375 g  Status:  Discontinued     3.375 g 12.5 mL/hr over 240 Minutes Intravenous Every 8 hours 06/01/16 1549 06/07/16 1031      Medications   Scheduled Meds: . amoxicillin-clavulanate  1 tablet Oral Q12H  . aspirin EC  81 mg Oral Daily  . atorvastatin  80 mg Oral q1800  . carvedilol  12.5 mg Oral BID WC  . clopidogrel  75 mg Oral Daily  . feeding supplement (GLUCERNA SHAKE)  237 mL Oral TID BM  . ferrous sulfate  325 mg Oral TID WC  . hydrALAZINE  25 mg Oral Q8H  . insulin aspart  0-5 Units  Subcutaneous QHS  . insulin aspart  0-9 Units Subcutaneous TID WC  . lisinopril  40 mg Oral Daily  . oxyCODONE  10 mg Oral Q12H  . pantoprazole  40 mg Oral Daily  . pregabalin  100 mg Oral BID  . senna-docusate  1 tablet Oral Daily  . sodium chloride flush  10-40 mL Intracatheter Q12H   Continuous Infusions:  PRN Meds:.acetaminophen **OR** acetaminophen, morphine injection, ondansetron **OR** ondansetron (ZOFRAN) IV, oxyCODONE-acetaminophen, senna-docusate, sodium chloride flush   Data Review:   Micro Results Recent Results (from the past 240 hour(s))  Culture, blood (Routine X 2) w Reflex to ID Panel     Status: None   Collection Time: 06/01/16  4:11 PM  Result Value Ref Range Status   Specimen Description BLOOD LEFT ASSIST CONTROL  Final   Special Requests   Final    BOTTLES DRAWN AEROBIC AND ANAEROBIC ANA11ML AER10ML   Culture NO GROWTH 5 DAYS  Final   Report Status 06/06/2016 FINAL  Final  Culture, blood (Routine X 2) w Reflex to ID Panel     Status: None   Collection Time: 06/01/16  4:12 PM  Result Value Ref Range Status   Specimen Description BLOOD ALINE  Final   Special Requests   Final    BOTTLES DRAWN AEROBIC AND ANAEROBIC ANA10ML AER10ML   Culture NO GROWTH 5 DAYS  Final   Report Status 06/06/2016 FINAL  Final  MRSA PCR Screening     Status: None   Collection Time: 06/01/16  4:13 PM  Result Value Ref Range Status   MRSA by PCR NEGATIVE NEGATIVE Final    Comment:        The GeneXpert MRSA Assay (FDA approved for NASAL specimens only), is one component of a comprehensive MRSA colonization surveillance program. It is not  intended to diagnose MRSA infection nor to guide or monitor treatment for MRSA infections.     Radiology Reports Dg Chest 2 View  Result Date: 06/01/2016 CLINICAL DATA:  Chest pain EXAM: CHEST  2 VIEW COMPARISON:  05/03/2016 FINDINGS: Right upper extremity PICC with tip at the SVC level. Stable borderline cardiomegaly. There is no edema,  consolidation, effusion, or pneumothorax. Stable mid thoracic degenerative disc narrowing and endplate sclerosis. IMPRESSION: 1. Stable exam.  No evidence of active disease. 2. Right upper extremity PICC in good position. Electronically Signed   By: Marnee Spring M.D.   On: 06/01/2016 10:06   Mr Foot Right Wo Contrast  Result Date: 06/02/2016 CLINICAL DATA:  Patient with history of an ulceration on the lateral aspect of the right foot over the last few months. Status post resection of the proximal fifth metatarsal, debridement of necrotic tissue and placement of antibiotic impregnated beads 05/08/2016. Patient admitted to the hospital 06/01/2016 with fever. EXAM: MRI OF THE RIGHT FOREFOOT WITHOUT CONTRAST TECHNIQUE: Multiplanar, multisequence MR imaging of the right foot was performed. No intravenous contrast was administered. COMPARISON:  MRI right foot 05/08/2016.  Plain films this same day. FINDINGS: Surgical wound is seen over the lateral foot with underlying antibiotic impregnated beads in place. The proximal fifth metatarsal has been resected. No marrow edema is seen in the remnant of the fifth metatarsal. There is marrow edema throughout the cuboid and lateral cuneiform which is more extensive and intense than that seen on the prior MRI are. Marrow edema is also seen in the proximal 2 cm of the second and third metatarsals, markedly increased compared the prior MRI. Milder degree of marrow edema is seen in subchondral bone of the navicular and medial and middle cuneiforms and subchondral bone of the base of the first metatarsal. Edema throughout intrinsic musculature the foot does not appear markedly changed compared to the prior MRI. Subcutaneous edema is seen about the ankle and foot. No focal fluid collection is seen. IMPRESSION: Marrow edema in the lateral cuneiform, cuboid and proximal second and third metatarsals is more extensive and intense than that seen on the prior MRI and worrisome for  osteomyelitis. Milder degree of marrow edema mainly in subchondral bone of the proximal fourth metatarsal, navicular, medial and middle cuneiforms and first metatarsal also appears increased compared to the prior study and could be due to osteomyelitis or reactive change. Status post resection of the proximal fifth metatarsal with antibiotic impregnated beads in place. Signal in the remnant of the fifth metatarsal is normal. Large wound at the surgical site.  Negative for abscess. Edema throughout all intrinsic musculature the foot is unchanged and could be inflammatory or due to early atrophy. Electronically Signed   By: Drusilla Kanner M.D.   On: 06/02/2016 13:57   Dg Foot Complete Right  Result Date: 06/02/2016 CLINICAL DATA:  Patient with history of an ulceration on the lateral aspect of the right foot over the last few months. Status post resection of the proximal fifth metatarsal, debridement of necrotic tissue and placement of antibiotic impregnated beads 05/08/2016. Patient admitted to the hospital 06/01/2016 with fever. EXAM: RIGHT FOOT COMPLETE - 3+ VIEW COMPARISON:  Plain films left foot 05/03/2016. MRI left ankle 05/08/2016. FINDINGS: Postoperative change of resection of the proximal fifth metatarsal and placement of antibiotic impregnated beads is identified. Soft tissues about the foot appear mildly swollen. Skin ulceration versus marked thinning of cutaneous tissues over the surgical site is identified. No soft tissue gas collection or  bony destructive change. IMPRESSION: Status post resection of proximal fifth metatarsal and placement of antibiotic impregnated beads. No plain film evidence of osteomyelitis or other acute bony abnormality. Skin over the patient's surgical site appears markedly thinned. Electronically Signed   By: Drusilla Kannerhomas  Dalessio M.D.   On: 06/02/2016 13:01     CBC  Recent Labs Lab 06/01/16 1003 06/02/16 0229 06/03/16 0534 06/05/16 0500 06/06/16 0941 06/07/16 1053   WBC 5.9 6.2 7.8 5.4 5.6 8.1  HGB 9.3* 9.8* 9.6* 8.6* 8.9* 8.7*  HCT 26.5* 27.1* 27.6* 25.7* 26.2* 25.0*  PLT 327 323 324 287 295 310  MCV 90.2 89.8 90.0 90.9 90.5 90.4  MCH 31.7 32.5 31.3 30.5 30.7 31.2  MCHC 35.1 36.2* 34.7 33.5 33.9 34.6  RDW 16.7* 16.6* 16.4* 16.3* 16.5* 16.6*  LYMPHSABS 0.4*  --   --   --   --  1.1  MONOABS 0.6  --   --   --   --  0.6  EOSABS 0.1  --   --   --   --  0.1  BASOSABS 0.0  --   --   --   --  0.0    Chemistries   Recent Labs Lab 06/01/16 1003  06/03/16 0534 06/04/16 0956 06/05/16 0500 06/06/16 0941 06/07/16 1053  NA 138  < > 139 138 137 139 137  K 3.5  < > 3.1* 3.5 3.6 4.0 4.0  CL 108  < > 108 110 109 109 110  CO2 22  < > 23 23 23 22 22   GLUCOSE 135*  < > 53* 159* 160* 163* 157*  BUN 12  < > 20 27* 27* 22* 22*  CREATININE 1.08  < > 1.25* 1.36* 1.18 1.22 1.16  CALCIUM 8.4*  < > 8.2* 7.9* 8.0* 8.3* 8.0*  AST 35  --   --   --   --   --   --   ALT 23  --   --   --   --   --   --   ALKPHOS 92  --   --   --   --   --   --   BILITOT 0.4  --   --   --   --   --   --   < > = values in this interval not displayed. ------------------------------------------------------------------------------------------------------------------ estimated creatinine clearance is 57.4 mL/min (by C-G formula based on SCr of 1.16 mg/dL). ------------------------------------------------------------------------------------------------------------------ No results for input(s): HGBA1C in the last 72 hours. ------------------------------------------------------------------------------------------------------------------ No results for input(s): CHOL, HDL, LDLCALC, TRIG, CHOLHDL, LDLDIRECT in the last 72 hours. ------------------------------------------------------------------------------------------------------------------ No results for input(s): TSH, T4TOTAL, T3FREE, THYROIDAB in the last 72 hours.  Invalid input(s):  FREET3 ------------------------------------------------------------------------------------------------------------------ No results for input(s): VITAMINB12, FOLATE, FERRITIN, TIBC, IRON, RETICCTPCT in the last 72 hours.  Coagulation profile No results for input(s): INR, PROTIME in the last 168 hours.  No results for input(s): DDIMER in the last 72 hours.  Cardiac Enzymes  Recent Labs Lab 06/01/16 1424 06/01/16 2101 06/02/16 0229  TROPONINI 0.05* 0.07* 0.08*   ------------------------------------------------------------------------------------------------------------------ Invalid input(s): POCBNP    Assessment & Plan   66 year old male with history of peripheral vascular disease status post stenting and PTCA in July, CAD, can make kidney disease stage III and recent hospitalization for right foot nonhealing ulcer with osteomyelitis who presents with chest pain and sepsis.  1. Sepsis: Patient presents with fever and tachypnea Source right foot Status post below-knee amputation of the right leg the  source of infection has been removed I will stop IV Zosyn I will place him on a brief course of oral Augmentin   2. Chest pain/pressure with history of CAD recent NSTEMI: continue aspirin and Plavix will need to resume this post surgery  3. Accelerated hypertension: Continue Coreg and add ACE inhibitor blood pressure stable   4. Chronic kidney disease stage III continue monitor stable  5. Diabetes:   Continue ssi patient will need his medication resumed he had hypoglycemia previously so we'll monitor for today  6. Nicotine abuse patient recommended to stop smoking   7. Anemia of chronic disease: Patient has received PRBCs during last hospitalization. Continue to monitor CBC.        Code Status Orders        Start     Ordered   06/01/16 1542  Full code  Continuous     06/01/16 1541    Code Status History    Date Active Date Inactive Code Status Order ID  Comments User Context   05/03/2016 10:54 PM 05/11/2016  6:08 PM Full Code [id number]  Enedina Finner, MD Inpatient   03/27/2016  7:34 PM 03/29/2016  5:47 PM Full Code [id number]  Enedina Finner, MD Inpatient   02/14/2016  6:10 PM 02/15/2016  5:03 PM Full Code [id number]  Alford Highland, MD ED   01/13/2016  9:42 AM 01/17/2016 11:47 PM Full Code [id number]  Eugenie Norrie, NP ED           Consults  Podiatry and cardiology  DVT Prophylaxis  Lovenox   Lab Results  Component Value Date   PLT 310 06/07/2016     Time Spent in minutes   Greater than 50% of time spent in care coordination and counseling patient regarding the condition and plan of care.   Auburn Bilberry M.D on 06/07/2016 at 12:03 PM  Between 7am to 6pm - Pager - [phone]  After 6pm go to [email or web address] - password [email or web address] [facility]  [facility]   Office  [phone]

## 2016-06-08 LAB — URINALYSIS, COMPLETE (UACMP) WITH MICROSCOPIC
BILIRUBIN URINE: NEGATIVE
Bacteria, UA: NONE SEEN
Glucose, UA: 150 mg/dL — AB
KETONES UR: NEGATIVE mg/dL
LEUKOCYTES UA: NEGATIVE
Nitrite: NEGATIVE
PROTEIN: 30 mg/dL — AB
Specific Gravity, Urine: 1.021 (ref 1.005–1.030)
pH: 5 (ref 5.0–8.0)

## 2016-06-08 LAB — BASIC METABOLIC PANEL
ANION GAP: 8 (ref 5–15)
BUN: 21 mg/dL — ABNORMAL HIGH (ref 6–20)
CHLORIDE: 110 mmol/L (ref 101–111)
CO2: 21 mmol/L — AB (ref 22–32)
CREATININE: 1.11 mg/dL (ref 0.61–1.24)
Calcium: 8.2 mg/dL — ABNORMAL LOW (ref 8.9–10.3)
GFR calc non Af Amer: >60 mL/min (ref >60)
Glucose, Bld: 159 mg/dL — ABNORMAL HIGH (ref 65–99)
POTASSIUM: 3.9 mmol/L (ref 3.5–5.1)
Sodium: 139 mmol/L (ref 135–145)

## 2016-06-08 LAB — CBC
HCT: 23.7 % — ABNORMAL LOW (ref 40.0–52.0)
Hemoglobin: 8 g/dL — ABNORMAL LOW (ref 13.0–18.0)
MCH: 30.8 pg (ref 26.0–34.0)
MCHC: 33.5 g/dL (ref 32.0–36.0)
MCV: 91.8 fL (ref 80.0–100.0)
Platelets: 305 10*3/uL (ref 150–440)
RBC: 2.59 MIL/uL — AB (ref 4.40–5.90)
RDW: 16.5 % — ABNORMAL HIGH (ref 11.5–14.5)
WBC: 8.4 10*3/uL (ref 3.8–10.6)

## 2016-06-08 LAB — GLUCOSE, CAPILLARY
GLUCOSE-CAPILLARY: 131 mg/dL — AB (ref 65–99)
GLUCOSE-CAPILLARY: 280 mg/dL — AB (ref 65–99)
GLUCOSE-CAPILLARY: 141 mg/dL — AB (ref 65–99)
Glucose-Capillary: 177 mg/dL — ABNORMAL HIGH (ref 65–99)

## 2016-06-08 LAB — SURGICAL PATHOLOGY

## 2016-06-08 MED ORDER — INSULIN GLARGINE 100 UNIT/ML ~~LOC~~ SOLN
4.0000 [IU] | Freq: Every day | SUBCUTANEOUS | Status: DC
  Administered 2016-06-08: 4 [IU] via SUBCUTANEOUS
  Filled 2016-06-08 (×2): qty 0.04

## 2016-06-08 MED ORDER — TAMSULOSIN HCL 0.4 MG PO CAPS
0.4000 mg | ORAL_CAPSULE | Freq: Every day | ORAL | Status: DC
  Administered 2016-06-08 – 2016-06-11 (×4): 0.4 mg via ORAL
  Filled 2016-06-08 (×4): qty 1

## 2016-06-08 NOTE — Progress Notes (Signed)
[employer] - [place] at [facility]                                                                                                                                                                                  Patient Demographics   Damon [name], is a 66 y.o. male, DOB - [birth year], RUE:[id number]  Admit date - 06/01/2016   Admitting Physician Adrian Saran, MD  Outpatient Primary MD for the patient is Karie Fetch A, MD   LOS - 7  Subjective: Patient currently doing okay denies any complaints.  Review of Systems:   CONSTITUTIONAL: No documented   fever. No fatigue, weakness. No weight gain, no weight loss.  EYES: No blurry or double vision.  ENT: No tinnitus. No postnasal drip. No redness of the oropharynx.  RESPIRATORY: No cough, no wheeze, no hemoptysis. No dyspnea.  CARDIOVASCULAR: No chest pain. No orthopnea. No palpitations. No syncope.  GASTROINTESTINAL: No nausea, no vomiting or diarrhea. No abdominal pain. No melena or hematochezia.  GENITOURINARY: No dysuria or hematuria.  ENDOCRINE: No polyuria or nocturia. No heat or cold intolerance.  HEMATOLOGY: No anemia. No bruising. No bleeding.  INTEGUMENTARY: No rashes. No lesions.  MUSCULOSKELETAL: No arthritis. No swelling. No gout. Pain and leg due to surgery NEUROLOGIC: No numbness, tingling, or ataxia. No seizure-type activity.  PSYCHIATRIC: No anxiety. No insomnia. No ADD.    Vitals:   Vitals:   06/07/16 1646 06/07/16 2358 06/08/16 0414 06/08/16 0738  BP: (!) 153/66 (!) 142/56 (!) 149/60 (!) 138/58  Pulse: 91 89 83 88  Resp:  18 18 20   Temp:  (!) 100.8 F (38.2 C) 100.2 F (37.9 C) 99.9 F (37.7 C)  TempSrc:  Oral Oral Oral  SpO2: 100% 99% 98% 100%  Weight:      Height:        Wt Readings from Last 3 Encounters:  06/04/16 139 lb 1.6 oz (63.1 kg)  05/03/16 160 lb (72.6 kg)  05/02/16 165 lb (74.8 kg)     Intake/Output Summary (Last 24 hours) at 06/08/16 1251 Last data filed at 06/08/16 1478   Gross per 24 hour  Intake              250 ml  Output              300 ml  Net              -50 ml    Physical Exam:   GENERAL: Pleasant-appearing in no apparent distress.  HEAD, EYES, EARS, NOSE AND THROAT: Atraumatic, normocephalic. Extraocular muscles are intact. Pupils equal and reactive to light. Sclerae anicteric. No conjunctival injection. No oro-pharyngeal erythema.  NECK: Supple. There is no jugular venous distention. No bruits, no lymphadenopathy, no thyromegaly.  HEART: Regular rate and rhythm,. No murmurs, no rubs, no clicks.  LUNGS: Clear to auscultation bilaterally. No rales or rhonchi. No wheezes.  ABDOMEN: Soft, flat, nontender, nondistended. Has good bowel sounds. No hepatosplenomegaly appreciated.  EXTREMITIES: No evidence of any cyanosis, clubbing, or peripheral edema.  Right leg status post BKA NEUROLOGIC: The patient is alert, awake, and oriented x3 with no focal motor or sensory deficits appreciated bilaterally.  SKIN: Moist and warm with no rashes appreciated.  Psych: Not anxious, depressed LN: No inguinal LN enlargement    Antibiotics   Anti-infectives    Start     Dose/Rate Route Frequency Ordered Stop   06/07/16 1200  amoxicillin-clavulanate (AUGMENTIN) 875-125 MG per tablet 1 tablet     1 tablet Oral Every 12 hours 06/07/16 1031     06/01/16 1600  piperacillin-tazobactam (ZOSYN) IVPB 3.375 g  Status:  Discontinued     3.375 g 12.5 mL/hr over 240 Minutes Intravenous Every 8 hours 06/01/16 1549 06/07/16 1031      Medications   Scheduled Meds: . amoxicillin-clavulanate  1 tablet Oral Q12H  . aspirin EC  81 mg Oral Daily  . atorvastatin  80 mg Oral q1800  . carvedilol  12.5 mg Oral BID WC  . clopidogrel  75 mg Oral Daily  . feeding supplement (GLUCERNA SHAKE)  237 mL Oral TID BM  . ferrous sulfate  325 mg Oral TID WC  . hydrALAZINE  25 mg Oral Q8H  . insulin aspart  0-5 Units Subcutaneous QHS  . insulin aspart  0-9 Units Subcutaneous TID WC  .  lisinopril  40 mg Oral Daily  . pantoprazole  40 mg Oral Daily  . pregabalin  100 mg Oral BID  . senna-docusate  1 tablet Oral Daily  . sodium chloride flush  10-40 mL Intracatheter Q12H   Continuous Infusions:  PRN Meds:.acetaminophen **OR** acetaminophen, morphine injection, ondansetron **OR** ondansetron (ZOFRAN) IV, oxyCODONE-acetaminophen, senna-docusate, sodium chloride flush   Data Review:   Micro Results Recent Results (from the past 240 hour(s))  Culture, blood (Routine X 2) w Reflex to ID Panel     Status: None   Collection Time: 06/01/16  4:11 PM  Result Value Ref Range Status   Specimen Description BLOOD LEFT ASSIST CONTROL  Final   Special Requests   Final    BOTTLES DRAWN AEROBIC AND ANAEROBIC ANA11ML AER10ML   Culture NO GROWTH 5 DAYS  Final   Report Status 06/06/2016 FINAL  Final  Culture, blood (Routine X 2) w Reflex to ID Panel     Status: None   Collection Time: 06/01/16  4:12 PM  Result Value Ref Range Status   Specimen Description BLOOD ALINE  Final   Special Requests   Final    BOTTLES DRAWN AEROBIC AND ANAEROBIC ANA10ML AER10ML   Culture NO GROWTH 5 DAYS  Final   Report Status 06/06/2016 FINAL  Final  MRSA PCR Screening     Status: None   Collection Time: 06/01/16  4:13 PM  Result Value Ref Range Status   MRSA by PCR NEGATIVE NEGATIVE Final    Comment:        The GeneXpert MRSA Assay (FDA approved for NASAL specimens only), is one component of a comprehensive MRSA colonization surveillance program. It is not intended to diagnose MRSA infection nor to guide or monitor treatment for MRSA infections.     Radiology Reports Dg Chest  2 View  Result Date: 06/01/2016 CLINICAL DATA:  Chest pain EXAM: CHEST  2 VIEW COMPARISON:  05/03/2016 FINDINGS: Right upper extremity PICC with tip at the SVC level. Stable borderline cardiomegaly. There is no edema, consolidation, effusion, or pneumothorax. Stable mid thoracic degenerative disc narrowing and endplate  sclerosis. IMPRESSION: 1. Stable exam.  No evidence of active disease. 2. Right upper extremity PICC in good position. Electronically Signed   By: Marnee Spring M.D.   On: 06/01/2016 10:06   Mr Foot Right Wo Contrast  Result Date: 06/02/2016 CLINICAL DATA:  Patient with history of an ulceration on the lateral aspect of the right foot over the last few months. Status post resection of the proximal fifth metatarsal, debridement of necrotic tissue and placement of antibiotic impregnated beads 05/08/2016. Patient admitted to the hospital 06/01/2016 with fever. EXAM: MRI OF THE RIGHT FOREFOOT WITHOUT CONTRAST TECHNIQUE: Multiplanar, multisequence MR imaging of the right foot was performed. No intravenous contrast was administered. COMPARISON:  MRI right foot 05/08/2016.  Plain films this same day. FINDINGS: Surgical wound is seen over the lateral foot with underlying antibiotic impregnated beads in place. The proximal fifth metatarsal has been resected. No marrow edema is seen in the remnant of the fifth metatarsal. There is marrow edema throughout the cuboid and lateral cuneiform which is more extensive and intense than that seen on the prior MRI are. Marrow edema is also seen in the proximal 2 cm of the second and third metatarsals, markedly increased compared the prior MRI. Milder degree of marrow edema is seen in subchondral bone of the navicular and medial and middle cuneiforms and subchondral bone of the base of the first metatarsal. Edema throughout intrinsic musculature the foot does not appear markedly changed compared to the prior MRI. Subcutaneous edema is seen about the ankle and foot. No focal fluid collection is seen. IMPRESSION: Marrow edema in the lateral cuneiform, cuboid and proximal second and third metatarsals is more extensive and intense than that seen on the prior MRI and worrisome for osteomyelitis. Milder degree of marrow edema mainly in subchondral bone of the proximal fourth metatarsal,  navicular, medial and middle cuneiforms and first metatarsal also appears increased compared to the prior study and could be due to osteomyelitis or reactive change. Status post resection of the proximal fifth metatarsal with antibiotic impregnated beads in place. Signal in the remnant of the fifth metatarsal is normal. Large wound at the surgical site.  Negative for abscess. Edema throughout all intrinsic musculature the foot is unchanged and could be inflammatory or due to early atrophy. Electronically Signed   By: Drusilla Kanner M.D.   On: 06/02/2016 13:57   Dg Foot Complete Right  Result Date: 06/02/2016 CLINICAL DATA:  Patient with history of an ulceration on the lateral aspect of the right foot over the last few months. Status post resection of the proximal fifth metatarsal, debridement of necrotic tissue and placement of antibiotic impregnated beads 05/08/2016. Patient admitted to the hospital 06/01/2016 with fever. EXAM: RIGHT FOOT COMPLETE - 3+ VIEW COMPARISON:  Plain films left foot 05/03/2016. MRI left ankle 05/08/2016. FINDINGS: Postoperative change of resection of the proximal fifth metatarsal and placement of antibiotic impregnated beads is identified. Soft tissues about the foot appear mildly swollen. Skin ulceration versus marked thinning of cutaneous tissues over the surgical site is identified. No soft tissue gas collection or bony destructive change. IMPRESSION: Status post resection of proximal fifth metatarsal and placement of antibiotic impregnated beads. No plain film evidence of  osteomyelitis or other acute bony abnormality. Skin over the patient's surgical site appears markedly thinned. Electronically Signed   By: Drusilla Kannerhomas  Dalessio M.D.   On: 06/02/2016 13:01     CBC  Recent Labs Lab 06/03/16 0534 06/05/16 0500 06/06/16 0941 06/07/16 1053 06/08/16 0324  WBC 7.8 5.4 5.6 8.1 8.4  HGB 9.6* 8.6* 8.9* 8.7* 8.0*  HCT 27.6* 25.7* 26.2* 25.0* 23.7*  PLT 324 287 295 310 305  MCV  90.0 90.9 90.5 90.4 91.8  MCH 31.3 30.5 30.7 31.2 30.8  MCHC 34.7 33.5 33.9 34.6 33.5  RDW 16.4* 16.3* 16.5* 16.6* 16.5*  LYMPHSABS  --   --   --  1.1  --   MONOABS  --   --   --  0.6  --   EOSABS  --   --   --  0.1  --   BASOSABS  --   --   --  0.0  --     Chemistries   Recent Labs Lab 06/04/16 0956 06/05/16 0500 06/06/16 0941 06/07/16 1053 06/08/16 0324  NA 138 137 139 137 139  K 3.5 3.6 4.0 4.0 3.9  CL 110 109 109 110 110  CO2 23 23 22 22  21*  GLUCOSE 159* 160* 163* 157* 159*  BUN 27* 27* 22* 22* 21*  CREATININE 1.36* 1.18 1.22 1.16 1.11  CALCIUM 7.9* 8.0* 8.3* 8.0* 8.2*   ------------------------------------------------------------------------------------------------------------------ estimated creatinine clearance is 60 mL/min (by C-G formula based on SCr of 1.11 mg/dL). ------------------------------------------------------------------------------------------------------------------ No results for input(s): HGBA1C in the last 72 hours. ------------------------------------------------------------------------------------------------------------------ No results for input(s): CHOL, HDL, LDLCALC, TRIG, CHOLHDL, LDLDIRECT in the last 72 hours. ------------------------------------------------------------------------------------------------------------------ No results for input(s): TSH, T4TOTAL, T3FREE, THYROIDAB in the last 72 hours.  Invalid input(s): FREET3 ------------------------------------------------------------------------------------------------------------------ No results for input(s): VITAMINB12, FOLATE, FERRITIN, TIBC, IRON, RETICCTPCT in the last 72 hours.  Coagulation profile No results for input(s): INR, PROTIME in the last 168 hours.  No results for input(s): DDIMER in the last 72 hours.  Cardiac Enzymes  Recent Labs Lab 06/01/16 1424 06/01/16 2101 06/02/16 0229  TROPONINI 0.05* 0.07* 0.08*    ------------------------------------------------------------------------------------------------------------------ Invalid input(s): POCBNP    Assessment & Plan   66 year old male with history of peripheral vascular disease status post stenting and PTCA in July, CAD, can make kidney disease stage III and recent hospitalization for right foot nonhealing ulcer with osteomyelitis who presents with chest pain and sepsis.  1. Sepsis: Patient presents with fever and tachypnea Source right foot Status post below-knee amputation of the right leg the source of infection has been removed Continue Augmentin patient had low-grade temperature last night will continue to monitor   2. Chest pain/pressure with history of CAD recent NSTEMI: continue aspirin and Plavix     3. Accelerated hypertension: Continue Coreg and add ACE inhibitor blood pressure stable   4. Chronic kidney disease stage III continue monitor stable  5. Diabetes:   Continue ssi patient I will restart the medication overdose  6. Nicotine abuse patient recommended to stop smoking   7. Anemia of chronic disease: continue to monitor       Code Status Orders        Start     Ordered   06/01/16 1542  Full code  Continuous     06/01/16 1541    Code Status History    Date Active Date Inactive Code Status Order ID Comments User Context   05/03/2016 10:54 PM 05/11/2016  6:08 PM Full Code [id number]  Enedina Finner, MD Inpatient   03/27/2016  7:34 PM 03/29/2016  5:47 PM Full Code [id number]  Enedina Finner, MD Inpatient   02/14/2016  6:10 PM 02/15/2016  5:03 PM Full Code [id number]  Alford Highland, MD ED   01/13/2016  9:42 AM 01/17/2016 11:47 PM Full Code [id number]  Eugenie Norrie, NP ED           Consults  Podiatry and cardiology  DVT Prophylaxis  Lovenox   Lab Results  Component Value Date   PLT 305 06/08/2016     Time Spent in minutes   Greater than 50% of time spent in care coordination and counseling  patient regarding the condition and plan of care.   Auburn Bilberry M.D on 06/08/2016 at 12:51 PM  Between 7am to 6pm - Pager - [phone]  After 6pm go to [email or web address] - password [email or web address]  [facility]   Office  [phone]

## 2016-06-08 NOTE — Progress Notes (Signed)
Physical Therapy Treatment Patient Details Name: Damon [name] MRN: [id number] DOB: 1950/06/16 Today's Date: 06/08/2016    History of Present Illness Patient is a 66 y.o. male admitted on 23 FEB with chest pain and fever. 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  PMH includes CKDIII, CAD, and PAD.    PT Comments    Pt appearing more alert today but still requiring extra time and cueing to follow 1 step commands.  Pt demonstrating posterior and intermittently posterior L lean in sitting requiring assist for sitting balance.  Pt able to sit with B UE support x50 seconds with close SBA and max cueing for technique.  Attempted to progress pt to lateral scooting on edge of bed but complicated d/t pt's difficulty correcting posterior lean and also pt unable to bring L foot back on floor towards bed (in sitting position) for optimal positioning (pt appears to have decreased L ankle DF ROM complicating positioning).  R knee extension ROM impairment also noted.  Will continue to progress pt with strengthening, ROM, sitting balance, and progress functional mobility per pt tolerance.   Follow Up Recommendations  SNF     Equipment Recommendations  Wheelchair (measurements PT);Wheelchair cushion (measurements PT);3in1 (PT)    Recommendations for Other Services OT consult     Precautions / Restrictions Precautions Precautions: Fall Restrictions Weight Bearing Restrictions: Yes Other Position/Activity Restrictions: New RLE BKA    Mobility  Bed Mobility Overal bed mobility: Needs Assistance Bed Mobility: Supine to Sit;Sit to Supine;Rolling Rolling:  (CGA to R and max assist to L using bedrail)   Supine to sit: Max assist;HOB elevated Sit to supine: Max assist;HOB elevated   General bed mobility comments: assist for trunk and LE's; vc's and  tactile cues required for hand placement and technique  Transfers Overall transfer level: Needs assistance Equipment used: None Transfers: Lateral/Scoot Transfers          Lateral/Scoot Transfers: Max assist;+2 physical assistance General transfer comment: attempted 1 lateral scoot to R and L on edge of bed but pt having difficulty initiating movement to assist and also difficulty correcting posterior lean  Ambulation/Gait             General Gait Details: Not appropriate at this time.   Stairs            Wheelchair Mobility    Modified Rankin (Stroke Patients Only)       Balance Overall balance assessment: Needs assistance Sitting-balance support: Bilateral upper extremity supported (L LE supported) Sitting balance-Leahy Scale: Poor Sitting balance - Comments: mostly mod to max assist for sitting balance Postural control: Posterior lean;Left lateral lean                          Cognition Arousal/Alertness: Awake/alert Behavior During Therapy: Flat affect Overall Cognitive Status: No family/caregiver present to determine baseline cognitive functioning         Following Commands: Follows one step commands inconsistently       General Comments: Increased time required to respond to cueing.    Exercises Total Joint Exercises Ankle Circles/Pumps: AROM;Strengthening;Left;10 reps;Supine Short Arc Quad: AAROM;Strengthening;Both;10 reps;Supine Heel Slides: AAROM;Strengthening;Left;10 reps;Supine Hip ABduction/ADduction: AAROM;Strengthening;Both;10 reps;Supine Straight Leg Raises: AAROM;Strengthening;Both;10 reps;Supine Knee Flexion: AAROM;Strengthening;Right;10 reps;Supine (knee flexion and extension AAROM exercise)  Vc's and tactile cues required for technique of  above ex's.    General Comments  Pt agreeable to PT session.      Pertinent Vitals/Pain Pain Assessment: Faces Pain Score: 3  Pain Location: R LE BKA site Pain Descriptors /  Indicators: Aching Pain Intervention(s): Limited activity within patient's tolerance;Monitored during session;Premedicated before session;Repositioned    Home Living                      Prior Function            PT Goals (current goals can now be found in the care plan section) Acute Rehab PT Goals Patient Stated Goal: to be able to use a prosthesis PT Goal Formulation: With patient Time For Goal Achievement: 06/22/16 Potential to Achieve Goals: Fair Progress towards PT goals: Progressing toward goals    Frequency    7X/week      PT Plan Current plan remains appropriate    Co-evaluation             End of Session Equipment Utilized During Treatment: Gait belt Activity Tolerance: Patient limited by fatigue Patient left: in bed;with call bell/phone within reach;with bed alarm set;with SCD's reapplied (R LE elevated and lower part of bed locked out into extension) Nurse Communication: Mobility status;Precautions PT Visit Diagnosis: Muscle weakness (generalized) (M62.81);Difficulty in walking, not elsewhere classified (R26.2)     Time: 1610-9604 PT Time Calculation (min) (ACUTE ONLY): 30 min  Charges:  $Therapeutic Exercise: 8-22 mins $Therapeutic Activity: 8-22 mins                    G CodesHendricks Limes, PT 06/08/16, 12:37 PM [phone]

## 2016-06-08 NOTE — Progress Notes (Signed)
ID e note Pt had BKA so no needs for continued IV abx and is on augmentin - however having low grade temps, wbc nml Has urinary retention with decreased UOP and 600 cc on bladder scan  I have suggested UA and UCX to eval for UTI.

## 2016-06-08 NOTE — Progress Notes (Signed)
Per MD patient will likely D/C Sunday 06/10/16. Plan is for patient to return to [facility]. Patient is aware of above. CSW contacted patient's wife Vena Austrialeanor and made her aware of above. Per Jomarie LongsJoseph [facility] liaison patient can come to [facility] over the weekend. CSW will continue to follow and assist as needed.   Baker Hughes IncorporatedBailey Lavone Barrientes, LCSW [phone]

## 2016-06-08 NOTE — Progress Notes (Signed)
Pt has not voided since about 0630 am, Pt bladder scanned, 461 ml results. MD Allena KatzPatel notified. Orders received for in and out X1, UA and urine culture. Orders placed.

## 2016-06-09 LAB — CBC
HEMATOCRIT: 22.3 % — AB (ref 40.0–52.0)
Hemoglobin: 7.5 g/dL — ABNORMAL LOW (ref 13.0–18.0)
MCH: 30.8 pg (ref 26.0–34.0)
MCHC: 33.5 g/dL (ref 32.0–36.0)
MCV: 92 fL (ref 80.0–100.0)
PLATELETS: 323 10*3/uL (ref 150–440)
RBC: 2.42 MIL/uL — ABNORMAL LOW (ref 4.40–5.90)
RDW: 16.7 % — AB (ref 11.5–14.5)
WBC: 9.5 10*3/uL (ref 3.8–10.6)

## 2016-06-09 LAB — GLUCOSE, CAPILLARY
GLUCOSE-CAPILLARY: 221 mg/dL — AB (ref 65–99)
GLUCOSE-CAPILLARY: 185 mg/dL — AB (ref 65–99)
Glucose-Capillary: 191 mg/dL — ABNORMAL HIGH (ref 65–99)
Glucose-Capillary: 200 mg/dL — ABNORMAL HIGH (ref 65–99)
Glucose-Capillary: 193 mg/dL — ABNORMAL HIGH (ref 65–99)

## 2016-06-09 MED ORDER — TAMSULOSIN HCL 0.4 MG PO CAPS: 0.4000 mg | ORAL_CAPSULE | Freq: Every day | ORAL | Status: DC

## 2016-06-09 MED ORDER — INSULIN GLARGINE 100 UNIT/ML ~~LOC~~ SOLN
6.0000 [IU] | Freq: Every day | SUBCUTANEOUS | Status: DC
  Administered 2016-06-09: 6 [IU] via SUBCUTANEOUS
  Filled 2016-06-09 (×2): qty 0.06

## 2016-06-09 NOTE — Progress Notes (Signed)
In and out catheterization done aseptically as ordered, was able to obtain output. Pt claimed "he felt relieved". Urine sample for culture taken and sent to lab.

## 2016-06-09 NOTE — Progress Notes (Signed)
Bladder scan 221 mL. Encourage fluid intake. Will continue to monitor.

## 2016-06-09 NOTE — Progress Notes (Addendum)
Pt noted with adventitous lung sounds, inspiratory wheezing noted. Education provided for incentive spirometer and cough & deep breathing. Pt with weak effort to breathe and cough, incentive spirometry achieved 750 with encouragement. Will continue to monitor and notify MD at rounds.

## 2016-06-09 NOTE — Progress Notes (Signed)
[employer] - [place] at [facility]                                                                                                                                                                                  Patient Demographics   Damon [name], is a 66 y.o. male, DOB - 23-Oct-1950, VWU:[id number]  Admit date - 06/01/2016   Admitting Physician Adrian Saran, MD  Outpatient Primary MD for the patient is [name], [name], MD   LOS - 8  Subjective: His sister reports that he is constipated otherwise denies any symptoms  Review of Systems:   CONSTITUTIONAL: No documented   fever. No fatigue, weakness. No weight gain, no weight loss.  EYES: No blurry or double vision.  ENT: No tinnitus. No postnasal drip. No redness of the oropharynx.  RESPIRATORY: No cough, no wheeze, no hemoptysis. No dyspnea.  CARDIOVASCULAR: No chest pain. No orthopnea. No palpitations. No syncope.  GASTROINTESTINAL: No nausea, no vomiting or diarrhea. No abdominal pain. No melena or hematochezia. Positive constipation GENITOURINARY: No dysuria or hematuria.  ENDOCRINE: No polyuria or nocturia. No heat or cold intolerance.  HEMATOLOGY: No anemia. No bruising. No bleeding.  INTEGUMENTARY: No rashes. No lesions.  MUSCULOSKELETAL: No arthritis. No swelling. No gout. Pain and leg due to surgery NEUROLOGIC: No numbness, tingling, or ataxia. No seizure-type activity.  PSYCHIATRIC: No anxiety. No insomnia. No ADD.    Vitals:   Vitals:   06/08/16 1708 06/08/16 2013 06/09/16 0459 06/09/16 0748  BP:  (!) 133/56 139/60 (!) 124/56  Pulse: 85 73 75 76  Resp:  20 18 14   Temp:  98.7 F (37.1 C) 99.3 F (37.4 C) 99.1 F (37.3 C)  TempSrc:  Oral Oral Oral  SpO2:  100% 100% 100%  Weight:      Height:        Wt Readings from Last 3 Encounters:  06/04/16 139 lb 1.6 oz (63.1 kg)  05/03/16 160 lb (72.6 kg)  05/02/16 165 lb (74.8 kg)     Intake/Output Summary (Last 24 hours) at 06/09/16 1104 Last data filed  at 06/09/16 0900  Gross per 24 hour  Intake             1520 ml  Output              675 ml  Net              845 ml    Physical Exam:   GENERAL: Pleasant-appearing in no apparent distress.  HEAD, EYES, EARS, NOSE AND THROAT: Atraumatic, normocephalic. Extraocular muscles are intact. Pupils equal and reactive to light. Sclerae anicteric. No conjunctival injection. No oro-pharyngeal erythema.  NECK: Supple. There is no jugular venous distention. No bruits, no lymphadenopathy, no thyromegaly.  HEART: Regular rate and rhythm,. No murmurs, no rubs, no clicks.  LUNGS: Clear to auscultation bilaterally. No rales or rhonchi. No wheezes.  ABDOMEN: Soft, flat, nontender, nondistended. Has good bowel sounds. No hepatosplenomegaly appreciated.  EXTREMITIES: No evidence of any cyanosis, clubbing, or peripheral edema.  Right leg status post BKA NEUROLOGIC: The patient is alert, awake, and oriented x3 with no focal motor or sensory deficits appreciated bilaterally.  SKIN: Moist and warm with no rashes appreciated.  Psych: Not anxious, depressed LN: No inguinal LN enlargement    Antibiotics   Anti-infectives    Start     Dose/Rate Route Frequency Ordered Stop   06/07/16 1200  amoxicillin-clavulanate (AUGMENTIN) 875-125 MG per tablet 1 tablet     1 tablet Oral Every 12 hours 06/07/16 1031     06/01/16 1600  piperacillin-tazobactam (ZOSYN) IVPB 3.375 g  Status:  Discontinued     3.375 g 12.5 mL/hr over 240 Minutes Intravenous Every 8 hours 06/01/16 1549 06/07/16 1031      Medications   Scheduled Meds: . amoxicillin-clavulanate  1 tablet Oral Q12H  . aspirin EC  81 mg Oral Daily  . atorvastatin  80 mg Oral q1800  . carvedilol  12.5 mg Oral BID WC  . clopidogrel  75 mg Oral Daily  . feeding supplement (GLUCERNA SHAKE)  237 mL Oral TID BM  . ferrous sulfate  325 mg Oral TID WC  . hydrALAZINE  25 mg Oral Q8H  . insulin aspart  0-5 Units Subcutaneous QHS  . insulin aspart  0-9 Units  Subcutaneous TID WC  . insulin glargine  4 Units Subcutaneous QHS  . lisinopril  40 mg Oral Daily  . pantoprazole  40 mg Oral Daily  . pregabalin  100 mg Oral BID  . senna-docusate  1 tablet Oral Daily  . sodium chloride flush  10-40 mL Intracatheter Q12H  . tamsulosin  0.4 mg Oral Daily  . tamsulosin  0.4 mg Oral Daily   Continuous Infusions:  PRN Meds:.acetaminophen **OR** acetaminophen, morphine injection, ondansetron **OR** ondansetron (ZOFRAN) IV, oxyCODONE-acetaminophen, senna-docusate, sodium chloride flush   Data Review:   Micro Results Recent Results (from the past 240 hour(s))  Culture, blood (Routine X 2) w Reflex to ID Panel     Status: None   Collection Time: 06/01/16  4:11 PM  Result Value Ref Range Status   Specimen Description BLOOD LEFT ASSIST CONTROL  Final   Special Requests   Final    BOTTLES DRAWN AEROBIC AND ANAEROBIC ANA11ML AER10ML   Culture NO GROWTH 5 DAYS  Final   Report Status 06/06/2016 FINAL  Final  Culture, blood (Routine X 2) w Reflex to ID Panel     Status: None   Collection Time: 06/01/16  4:12 PM  Result Value Ref Range Status   Specimen Description BLOOD ALINE  Final   Special Requests   Final    BOTTLES DRAWN AEROBIC AND ANAEROBIC ANA10ML AER10ML   Culture NO GROWTH 5 DAYS  Final   Report Status 06/06/2016 FINAL  Final  MRSA PCR Screening     Status: None   Collection Time: 06/01/16  4:13 PM  Result Value Ref Range Status   MRSA by PCR NEGATIVE NEGATIVE Final    Comment:        The GeneXpert MRSA Assay (FDA approved for NASAL specimens only), is one component of a comprehensive MRSA colonization surveillance program.  It is not intended to diagnose MRSA infection nor to guide or monitor treatment for MRSA infections.     Radiology Reports Dg Chest 2 View  Result Date: 06/01/2016 CLINICAL DATA:  Chest pain EXAM: CHEST  2 VIEW COMPARISON:  05/03/2016 FINDINGS: Right upper extremity PICC with tip at the SVC level. Stable  borderline cardiomegaly. There is no edema, consolidation, effusion, or pneumothorax. Stable mid thoracic degenerative disc narrowing and endplate sclerosis. IMPRESSION: 1. Stable exam.  No evidence of active disease. 2. Right upper extremity PICC in good position. Electronically Signed   By: Marnee Spring M.D.   On: 06/01/2016 10:06   Mr Foot Right Wo Contrast  Result Date: 06/02/2016 CLINICAL DATA:  Patient with history of an ulceration on the lateral aspect of the right foot over the last few months. Status post resection of the proximal fifth metatarsal, debridement of necrotic tissue and placement of antibiotic impregnated beads 05/08/2016. Patient admitted to the hospital 06/01/2016 with fever. EXAM: MRI OF THE RIGHT FOREFOOT WITHOUT CONTRAST TECHNIQUE: Multiplanar, multisequence MR imaging of the right foot was performed. No intravenous contrast was administered. COMPARISON:  MRI right foot 05/08/2016.  Plain films this same day. FINDINGS: Surgical wound is seen over the lateral foot with underlying antibiotic impregnated beads in place. The proximal fifth metatarsal has been resected. No marrow edema is seen in the remnant of the fifth metatarsal. There is marrow edema throughout the cuboid and lateral cuneiform which is more extensive and intense than that seen on the prior MRI are. Marrow edema is also seen in the proximal 2 cm of the second and third metatarsals, markedly increased compared the prior MRI. Milder degree of marrow edema is seen in subchondral bone of the navicular and medial and middle cuneiforms and subchondral bone of the base of the first metatarsal. Edema throughout intrinsic musculature the foot does not appear markedly changed compared to the prior MRI. Subcutaneous edema is seen about the ankle and foot. No focal fluid collection is seen. IMPRESSION: Marrow edema in the lateral cuneiform, cuboid and proximal second and third metatarsals is more extensive and intense than that  seen on the prior MRI and worrisome for osteomyelitis. Milder degree of marrow edema mainly in subchondral bone of the proximal fourth metatarsal, navicular, medial and middle cuneiforms and first metatarsal also appears increased compared to the prior study and could be due to osteomyelitis or reactive change. Status post resection of the proximal fifth metatarsal with antibiotic impregnated beads in place. Signal in the remnant of the fifth metatarsal is normal. Large wound at the surgical site.  Negative for abscess. Edema throughout all intrinsic musculature the foot is unchanged and could be inflammatory or due to early atrophy. Electronically Signed   By: Drusilla Kanner M.D.   On: 06/02/2016 13:57   Dg Foot Complete Right  Result Date: 06/02/2016 CLINICAL DATA:  Patient with history of an ulceration on the lateral aspect of the right foot over the last few months. Status post resection of the proximal fifth metatarsal, debridement of necrotic tissue and placement of antibiotic impregnated beads 05/08/2016. Patient admitted to the hospital 06/01/2016 with fever. EXAM: RIGHT FOOT COMPLETE - 3+ VIEW COMPARISON:  Plain films left foot 05/03/2016. MRI left ankle 05/08/2016. FINDINGS: Postoperative change of resection of the proximal fifth metatarsal and placement of antibiotic impregnated beads is identified. Soft tissues about the foot appear mildly swollen. Skin ulceration versus marked thinning of cutaneous tissues over the surgical site is identified. No soft tissue  gas collection or bony destructive change. IMPRESSION: Status post resection of proximal fifth metatarsal and placement of antibiotic impregnated beads. No plain film evidence of osteomyelitis or other acute bony abnormality. Skin over the patient's surgical site appears markedly thinned. Electronically Signed   By: Drusilla Kanner M.D.   On: 06/02/2016 13:01     CBC  Recent Labs Lab 06/05/16 0500 06/06/16 0941 06/07/16 1053  06/08/16 0324 06/09/16 0329  WBC 5.4 5.6 8.1 8.4 9.5  HGB 8.6* 8.9* 8.7* 8.0* 7.5*  HCT 25.7* 26.2* 25.0* 23.7* 22.3*  PLT 287 295 310 305 323  MCV 90.9 90.5 90.4 91.8 92.0  MCH 30.5 30.7 31.2 30.8 30.8  MCHC 33.5 33.9 34.6 33.5 33.5  RDW 16.3* 16.5* 16.6* 16.5* 16.7*  LYMPHSABS  --   --  1.1  --   --   MONOABS  --   --  0.6  --   --   EOSABS  --   --  0.1  --   --   BASOSABS  --   --  0.0  --   --     Chemistries   Recent Labs Lab 06/04/16 0956 06/05/16 0500 06/06/16 0941 06/07/16 1053 06/08/16 0324  NA 138 137 139 137 139  K 3.5 3.6 4.0 4.0 3.9  CL 110 109 109 110 110  CO2 23 23 22 22  21*  GLUCOSE 159* 160* 163* 157* 159*  BUN 27* 27* 22* 22* 21*  CREATININE 1.36* 1.18 1.22 1.16 1.11  CALCIUM 7.9* 8.0* 8.3* 8.0* 8.2*   ------------------------------------------------------------------------------------------------------------------ estimated creatinine clearance is 60 mL/min (by C-G formula based on SCr of 1.11 mg/dL). ------------------------------------------------------------------------------------------------------------------ No results for input(s): HGBA1C in the last 72 hours. ------------------------------------------------------------------------------------------------------------------ No results for input(s): CHOL, HDL, LDLCALC, TRIG, CHOLHDL, LDLDIRECT in the last 72 hours. ------------------------------------------------------------------------------------------------------------------ No results for input(s): TSH, T4TOTAL, T3FREE, THYROIDAB in the last 72 hours.  Invalid input(s): FREET3 ------------------------------------------------------------------------------------------------------------------ No results for input(s): VITAMINB12, FOLATE, FERRITIN, TIBC, IRON, RETICCTPCT in the last 72 hours.  Coagulation profile No results for input(s): INR, PROTIME in the last 168 hours.  No results for input(s): DDIMER in the last 72 hours.  Cardiac  Enzymes No results for input(s): CKMB, TROPONINI, MYOGLOBIN in the last 168 hours.  Invalid input(s): CK ------------------------------------------------------------------------------------------------------------------ Invalid input(s): POCBNP    Assessment & Plan   66 year old male with history of peripheral vascular disease status post stenting and PTCA in July, CAD, can make kidney disease stage III and recent hospitalization for right foot nonhealing ulcer with osteomyelitis who presents with chest pain and sepsis.  1. Sepsis: Patient presents with fever and tachypnea Source right foot Status post below-knee amputation of the right leg the source of infection has been removed Continue Augmentin   Low-grade temperature now resolved continue follow temperature curve   2. Acute blood loss anemia on chronic anemia hemoglobin 7.5 today we will monitor his his CBC for tomorrow if hemoglobin drops below 7 I will transfuse him  3. Accelerated hypertension: Continue Coreg and add ACE inhibitor blood pressure stable   4. Chronic kidney disease stage III continue monitor stable Asian had urinary retention I'll start him on Flomax  5. Diabetes:   Continue ssi patient  adjust medications  6. Nicotine abuse patient recommended to stop smoking          Code Status Orders        Start     Ordered   06/01/16 1542  Full code  Continuous  06/01/16 1541    Code Status History    Date Active Date Inactive Code Status Order ID Comments User Context   05/03/2016 10:54 PM 05/11/2016  6:08 PM Full Code [id number]  Enedina FinnerSona Memory Heinrichs, MD Inpatient   03/27/2016  7:34 PM 03/29/2016  5:47 PM Full Code [id number]  Enedina FinnerSona Macdonald Rigor, MD Inpatient   02/14/2016  6:10 PM 02/15/2016  5:03 PM Full Code [id number]  Alford Highlandichard Wieting, MD ED   01/13/2016  9:42 AM 01/17/2016 11:47 PM Full Code [id number]  Eugenie Norrieana G Blakeney, NP ED           Consults  Podiatry and cardiology  DVT Prophylaxis  Lovenox   Lab  Results  Component Value Date   PLT 323 06/09/2016     Time Spent in minutes  25min  Greater than 50% of time spent in care coordination and counseling patient regarding the condition and plan of care.   Auburn BilberryPATEL, Nichollas Perusse M.D on 06/09/2016 at 11:04 AM  Between 7am to 6pm - Pager - [phone]  After 6pm go to [email or web address] - password [email or web address] [facility]  [facility]   Office  [phone]

## 2016-06-09 NOTE — Progress Notes (Signed)
Pt unable to void after an in and out cath done yesterday afternoon. Pt has the urge but unable to start stream. Bladder scan done and revealed 516ml. On call Dr Tobi BastosPyreddy paged and ordered for in and out catheter. Will carry out.

## 2016-06-09 NOTE — Progress Notes (Signed)
Bladder scan volume .

## 2016-06-09 NOTE — Progress Notes (Addendum)
Physical Therapy Treatment Patient Details Name: Damon [name] MRN: [id number] DOB: 27-Sep-1950 Today's Date: 06/09/2016    History of Present Illness Patient is a 66 y.o. male admitted on 23 FEB with chest pain and fever. 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  PMH includes CKDIII, CAD, and PAD.    PT Comments    Participated in exercises as described below. He required AAROM for both LE's to continue with tasks.   Max a x 1 for bed mobility Sat EOB x 3 minutes with overall poor quality.  Pt with post lean while sitting with BUE support and assist to keep LLE on the floor.  Verbal and tactile cues for trunk flexion but he resists.  He does voice some fear of falling.  Pt limited by fatigue.  HgB noted to be 7.5.   Pt does state he wants a prosthesis.  Discussed rehab and prosthetic  Process with patient.  Encouraged max effort with daily therapy sessions.  Voiced understanding.   Follow Up Recommendations  SNF     Equipment Recommendations  Wheelchair (measurements PT);Wheelchair cushion (measurements PT);3in1 (PT)    Recommendations for Other Services OT consult     Precautions / Restrictions Precautions Precautions: Fall Restrictions Weight Bearing Restrictions: Yes Other Position/Activity Restrictions: New RLE BKA    Mobility  Bed Mobility Overal bed mobility: Needs Assistance Bed Mobility: Supine to Sit;Sit to Supine;Rolling     Supine to sit: Max assist;HOB elevated Sit to supine: Max assist;HOB elevated   General bed mobility comments: assist for trunk and LE's; vc's and tactile cues required for hand placement and technique  Transfers Overall transfer level: Needs assistance Equipment used: None             General transfer comment: unable to attempt lateral scoot at edge of bed.  Pt with post lean in  sitting and resisted trunk flexion to allow L foot placement on floor.  Ambulation/Gait             General Gait Details: Not appropriate at this time.   Stairs            Wheelchair Mobility    Modified Rankin (Stroke Patients Only)       Balance Overall balance assessment: Needs assistance Sitting-balance support: Bilateral upper extremity supported Sitting balance-Leahy Scale: Poor Sitting balance - Comments: mod assist to remain sitting EOB Postural control: Posterior lean;Right lateral lean Standing balance support: Bilateral upper extremity supported   Standing balance comment: Unable/unsafe to attempt                    Cognition Arousal/Alertness: Awake/alert Behavior During Therapy: Flat affect Overall Cognitive Status: No family/caregiver present to determine baseline cognitive functioning Area of Impairment: Attention;Following commands;Awareness       Following Commands: Follows one step commands inconsistently       General Comments: Increased time required to respond to cueing.    Exercises General Exercises - Lower Extremity Ankle Circles/Pumps: 10 reps;Supine;AAROM;Left Quad Sets: Strengthening;Both;10 reps;Supine Gluteal Sets: Strengthening;Both;10 reps;Supine Short Arc Quad: AAROM;Supine;Both;10 reps Heel Slides: AAROM;Supine;Both;10 reps Hip ABduction/ADduction: AAROM;Supine;Both;10 reps Straight Leg Raises: AAROM;Supine;Both;10 reps Other Exercises Other Exercises: sat EOB x 3 minutes     General Comments        Pertinent Vitals/Pain Pain Assessment: No/denies pain    Home Living  Prior Function            PT Goals (current goals can now be found in the care plan section) Progress towards PT goals: Progressing toward goals    Frequency    7X/week      PT Plan Current plan remains appropriate    Co-evaluation             End of Session Equipment Utilized During  Treatment: Gait belt Activity Tolerance: Patient limited by fatigue Patient left: in bed;with call bell/phone within reach;with bed alarm set;with SCD's reapplied         Time: 4098-1191 PT Time Calculation (min) (ACUTE ONLY): 10 min  Charges:  $Therapeutic Exercise: 8-22 mins                    G Codes:       Danielle Dess 2016/07/04, 9:35 AM

## 2016-06-09 NOTE — Progress Notes (Signed)
Pt still unable to urinate, bladder scan done, revealed 313ml. On call Dr. Judithann SheenSparks paged and said to encourage fluids and keep monitoring. Pt appeared to be comfortable this time, no complains of pain.

## 2016-06-10 LAB — URINE CULTURE: Culture: NO GROWTH

## 2016-06-10 LAB — GLUCOSE, CAPILLARY
GLUCOSE-CAPILLARY: 244 mg/dL — AB (ref 65–99)
Glucose-Capillary: 264 mg/dL — ABNORMAL HIGH (ref 65–99)
Glucose-Capillary: 260 mg/dL — ABNORMAL HIGH (ref 65–99)
Glucose-Capillary: 284 mg/dL — ABNORMAL HIGH (ref 65–99)

## 2016-06-10 LAB — PREPARE RBC (CROSSMATCH)

## 2016-06-10 LAB — HEMOGLOBIN AND HEMATOCRIT, BLOOD
HCT: 25.5 % — ABNORMAL LOW (ref 40.0–52.0)
HEMOGLOBIN: 8.7 g/dL — AB (ref 13.0–18.0)

## 2016-06-10 LAB — CBC
HEMATOCRIT: 21.1 % — AB (ref 40.0–52.0)
HEMOGLOBIN: 7.1 g/dL — AB (ref 13.0–18.0)
MCH: 30.8 pg (ref 26.0–34.0)
MCHC: 33.5 g/dL (ref 32.0–36.0)
MCV: 92 fL (ref 80.0–100.0)
Platelets: 341 10*3/uL (ref 150–440)
RBC: 2.3 MIL/uL — ABNORMAL LOW (ref 4.40–5.90)
RDW: 16.8 % — AB (ref 11.5–14.5)
WBC: 9.3 10*3/uL (ref 3.8–10.6)

## 2016-06-10 MED ORDER — LACTULOSE 10 GM/15ML PO SOLN
30.0000 g | Freq: Once | ORAL | Status: AC
  Administered 2016-06-10: 30 g via ORAL
  Filled 2016-06-10: qty 60

## 2016-06-10 MED ORDER — POLYETHYLENE GLYCOL 3350 17 G PO PACK
17.0000 g | PACK | Freq: Every day | ORAL | Status: DC
  Administered 2016-06-10 – 2016-06-11 (×2): 17 g via ORAL
  Filled 2016-06-10 (×2): qty 1

## 2016-06-10 MED ORDER — SODIUM CHLORIDE 0.9 % IV SOLN
Freq: Once | INTRAVENOUS | Status: AC
  Administered 2016-06-10: 15:00:00 via INTRAVENOUS

## 2016-06-10 MED ORDER — INSULIN GLARGINE 100 UNIT/ML ~~LOC~~ SOLN
8.0000 [IU] | Freq: Every day | SUBCUTANEOUS | Status: DC
  Administered 2016-06-10: 8 [IU] via SUBCUTANEOUS
  Filled 2016-06-10 (×2): qty 0.08

## 2016-06-10 NOTE — Progress Notes (Signed)
Pt tolerated transfusion of 1 unit with no adverse effects. Recheck H&H in 1 hour.

## 2016-06-10 NOTE — Progress Notes (Signed)
Physical Therapy Treatment Patient Details Name: Damon [name] MRN: [id number] DOB: 03-21-1951 Today's Date: 06/10/2016    History of Present Illness Patient is a 66 y.o. male admitted on 23 FEB with chest pain and fever. 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  PMH includes CKDIII, CAD, and PAD.    PT Comments    Patient progressing well. He was able to progress to AROM today with exercise as [facility]. Patient does require increased cues for better ROM with exercise. He reports less pain after treatment session which is a positive response to exercise and mobility; Patient also requires less assistance with bed mobility today; He continues to demonstrate a posterior lean but was able to hold self up into sitting with BUE support with supervision for 1-2 min. Transfers not attempted today as patient reports increased dizziness sitting edge of bed and exhibits low hemoglobin of 7.1. Patient would benefit from additional skilled PT intervention to improve functional mobility and strength;   Follow Up Recommendations  SNF     Equipment Recommendations  Wheelchair (measurements PT);Wheelchair cushion (measurements PT);3in1 (PT)    Recommendations for Other Services OT consult     Precautions / Restrictions Precautions Precautions: Fall Restrictions Weight Bearing Restrictions: Yes RLE Weight Bearing: Non weight bearing Other Position/Activity Restrictions: New RLE BKA    Mobility  Bed Mobility Overal bed mobility: Needs Assistance Bed Mobility: Supine to Sit;Sit to Supine     Supine to sit: Mod assist;HOB elevated Sit to supine: HOB elevated;Min assist   General bed mobility comments: +1 assist for bed mobility; Requires cues for hand placement and LE positioning; Requires assist with trunk to push  into sitting position; Once sitting, requires min A for maintaining positioning; Patient transitions sit to supine wiht min A requiring assist for orienting trunk to midline of bed; Scoots up in bed with min A requiring cues for hand placement and assist to block LLE for better push through LE;   Transfers                 General transfer comment: unable to attempt transfer today; pt dizzy sitting up edge of bed, with increased posterior lean and low hemaglobin at 7.1 did not attempt transfer;   Ambulation/Gait             General Gait Details: Not appropriate at this time.   Stairs            Wheelchair Mobility    Modified Rankin (Stroke Patients Only)       Balance Overall balance assessment: Needs assistance Sitting-balance support: Bilateral upper extremity supported Sitting balance-Leahy Scale: Poor Sitting balance - Comments: min assist to remain sitting EOB Postural control: Posterior lean     Standing balance comment: Unable/unsafe to attempt                    Cognition Arousal/Alertness: Awake/alert Behavior During Therapy: Flat affect Overall Cognitive Status: No family/caregiver present to determine baseline cognitive functioning Area of Impairment: Attention;Following commands;Awareness       Following Commands: Follows one step commands inconsistently       General Comments: Increased time required to respond to cueing.    Exercises Total Joint Exercises Ankle Circles/Pumps: AROM;Strengthening;Left;10 reps;Supine Quad Sets: AROM;Strengthening;Both;10 reps;Supine Short Arc Quad: Strengthening;Both;10 reps;Supine;AROM Hip ABduction/ADduction: Strengthening;Both;10  reps;Supine;AROM Straight Leg Raises: Strengthening;Both;10 reps;Supine;AROM Knee Flexion:  (knee flexion and extension AAROM exercise) Other Exercises Other Exercises: All supine LE strengthening exercise done AROM with cues to increase ROM for better flexibility  and strengthening; Other Exercises: Instructed patient in sitting edge of bed activities x5 min to facilitate forward lean including reaching with each UE and with BUE; pt also encourage to increase erect head with forward lean for better posture; able to sit with BUE supported with supervision x2 min before demonstrating posterior lean;     General Comments        Pertinent Vitals/Pain Pain Assessment: 0-10 Pain Score: 8  Pain Location: RLE BKA at start of session; however at end of session reports no pain;  Pain Descriptors / Indicators: Aching Pain Intervention(s): Limited activity within patient's tolerance;Repositioned;Monitored during session    Home Living                      Prior Function            PT Goals (current goals can now be found in the care plan section) Acute Rehab PT Goals Patient Stated Goal: to be able to use a prosthesis PT Goal Formulation: With patient Time For Goal Achievement: 06/22/16 Potential to Achieve Goals: Fair Progress towards PT goals: Progressing toward goals    Frequency    7X/week      PT Plan Current plan remains appropriate    Co-evaluation             End of Session   Activity Tolerance: Patient limited by fatigue Patient left: in bed;with call bell/phone within reach;with bed alarm set;with SCD's reapplied         Time: 9147-8295 PT Time Calculation (min) (ACUTE ONLY): 23 min  Charges:  $Therapeutic Exercise: 8-22 mins $Therapeutic Activity: 8-22 mins                    G Codes:       Trotter,Margaret PT, DPT 06/10/2016, 11:10 AM

## 2016-06-10 NOTE — Progress Notes (Signed)
[employer] [facility] [place] at [facility]                                                                                                                                                                                  Patient Demographics   Damon [name], is a 66 y.o. male, DOB - 09/30/1950, ZOX:[id number]  Admit date - 06/01/2016   Admitting Physician Adrian Saran, MD  Outpatient Primary MD for the patient is [name], [name], MD   LOS - 9  Subjective: He still constipated, able to void per nurse , hgb trended down to 7.1  Review of Systems:   CONSTITUTIONAL: No documented   fever. No fatigue, weakness. No weight gain, no weight loss.  EYES: No blurry or double vision.  ENT: No tinnitus. No postnasal drip. No redness of the oropharynx.  RESPIRATORY: No cough, no wheeze, no hemoptysis. No dyspnea.  CARDIOVASCULAR: No chest pain. No orthopnea. No palpitations. No syncope.  GASTROINTESTINAL: No nausea, no vomiting or diarrhea. No abdominal pain. No melena or hematochezia. Positive constipation GENITOURINARY: No dysuria or hematuria.  ENDOCRINE: No polyuria or nocturia. No heat or cold intolerance.  HEMATOLOGY: No anemia. No bruising. No bleeding.  INTEGUMENTARY: No rashes. No lesions.  MUSCULOSKELETAL: No arthritis. No swelling. No gout. Pain and leg due to surgery NEUROLOGIC: No numbness, tingling, or ataxia. No seizure-type activity.  PSYCHIATRIC: No anxiety. No insomnia. No ADD.    Vitals:   Vitals:   06/09/16 1419 06/09/16 2131 06/10/16 0505 06/10/16 0729  BP: (!) 113/53 (!) 121/58 (!) 124/59 (!) 135/59  Pulse: 77 73 82 76  Resp:  16 18 16   Temp: 99.3 F (37.4 C) 99.6 F (37.6 C) 100 F (37.8 C) 97.7 F (36.5 C)  TempSrc: Oral Oral Oral Oral  SpO2: 100% 99% 99% 100%  Weight:      Height:        Wt Readings from Last 3 Encounters:  06/04/16 139 lb 1.6 oz (63.1 kg)  05/03/16 160 lb (72.6 kg)  05/02/16 165 lb (74.8 kg)     Intake/Output Summary (Last 24  hours) at 06/10/16 1151 Last data filed at 06/10/16 0800  Gross per 24 hour  Intake             1200 ml  Output              200 ml  Net             1000 ml    Physical Exam:   GENERAL: Pleasant-appearing in no apparent distress.  HEAD, EYES, EARS, NOSE AND THROAT: Atraumatic, normocephalic. Extraocular muscles are intact. Pupils equal and reactive to light. Sclerae  anicteric. No conjunctival injection. No oro-pharyngeal erythema.  NECK: Supple. There is no jugular venous distention. No bruits, no lymphadenopathy, no thyromegaly.  HEART: Regular rate and rhythm,. No murmurs, no rubs, no clicks.  LUNGS: Clear to auscultation bilaterally. No rales or rhonchi. No wheezes.  ABDOMEN: Soft, flat, nontender, nondistended. Has good bowel sounds. No hepatosplenomegaly appreciated.  EXTREMITIES: No evidence of any cyanosis, clubbing, or peripheral edema.  Right leg status post BKA NEUROLOGIC: The patient is alert, awake, and oriented x3 with no focal motor or sensory deficits appreciated bilaterally.  SKIN: Moist and warm with no rashes appreciated.  Psych: Not anxious, depressed LN: No inguinal LN enlargement    Antibiotics   Anti-infectives    Start     Dose/Rate Route Frequency Ordered Stop   06/07/16 1200  amoxicillin-clavulanate (AUGMENTIN) 875-125 MG per tablet 1 tablet     1 tablet Oral Every 12 hours 06/07/16 1031     06/01/16 1600  piperacillin-tazobactam (ZOSYN) IVPB 3.375 g  Status:  Discontinued     3.375 g 12.5 mL/hr over 240 Minutes Intravenous Every 8 hours 06/01/16 1549 06/07/16 1031      Medications   Scheduled Meds: . sodium chloride   Intravenous Once  . amoxicillin-clavulanate  1 tablet Oral Q12H  . aspirin EC  81 mg Oral Daily  . atorvastatin  80 mg Oral q1800  . carvedilol  12.5 mg Oral BID WC  . clopidogrel  75 mg Oral Daily  . feeding supplement (GLUCERNA SHAKE)  237 mL Oral TID BM  . ferrous sulfate  325 mg Oral TID WC  . hydrALAZINE  25 mg Oral Q8H  .  insulin aspart  0-5 Units Subcutaneous QHS  . insulin aspart  0-9 Units Subcutaneous TID WC  . insulin glargine  8 Units Subcutaneous QHS  . lisinopril  40 mg Oral Daily  . pantoprazole  40 mg Oral Daily  . pregabalin  100 mg Oral BID  . senna-docusate  1 tablet Oral Daily  . sodium chloride flush  10-40 mL Intracatheter Q12H  . tamsulosin  0.4 mg Oral Daily   Continuous Infusions:  PRN Meds:.acetaminophen **OR** acetaminophen, morphine injection, ondansetron **OR** ondansetron (ZOFRAN) IV, oxyCODONE-acetaminophen, senna-docusate, sodium chloride flush   Data Review:   Micro Results Recent Results (from the past 240 hour(s))  Culture, blood (Routine X 2) w Reflex to ID Panel     Status: None   Collection Time: 06/01/16  4:11 PM  Result Value Ref Range Status   Specimen Description BLOOD LEFT ASSIST CONTROL  Final   Special Requests   Final    BOTTLES DRAWN AEROBIC AND ANAEROBIC ANA11ML AER10ML   Culture NO GROWTH 5 DAYS  Final   Report Status 06/06/2016 FINAL  Final  Culture, blood (Routine X 2) w Reflex to ID Panel     Status: None   Collection Time: 06/01/16  4:12 PM  Result Value Ref Range Status   Specimen Description BLOOD ALINE  Final   Special Requests   Final    BOTTLES DRAWN AEROBIC AND ANAEROBIC ANA10ML AER10ML   Culture NO GROWTH 5 DAYS  Final   Report Status 06/06/2016 FINAL  Final  MRSA PCR Screening     Status: None   Collection Time: 06/01/16  4:13 PM  Result Value Ref Range Status   MRSA by PCR NEGATIVE NEGATIVE Final    Comment:        The GeneXpert MRSA Assay (FDA approved for NASAL specimens only), is one  component of a comprehensive MRSA colonization surveillance program. It is not intended to diagnose MRSA infection nor to guide or monitor treatment for MRSA infections.   Urine culture     Status: None   Collection Time: 06/09/16  5:00 AM  Result Value Ref Range Status   Specimen Description URINE, CATHETERIZED  Final   Special Requests NONE   Final   Culture   Final    NO GROWTH Performed at [facility], [address] [place], [place] [address]    Report Status 06/10/2016 FINAL  Final    Radiology Reports Dg Chest 2 View  Result Date: 06/01/2016 CLINICAL DATA:  Chest pain EXAM: CHEST  2 VIEW COMPARISON:  05/03/2016 FINDINGS: Right upper extremity PICC with tip at the SVC level. Stable borderline cardiomegaly. There is no edema, consolidation, effusion, or pneumothorax. Stable mid thoracic degenerative disc narrowing and endplate sclerosis. IMPRESSION: 1. Stable exam.  No evidence of active disease. 2. Right upper extremity PICC in good position. Electronically Signed   By: Marnee Spring M.D.   On: 06/01/2016 10:06   Mr Foot Right Wo Contrast  Result Date: 06/02/2016 CLINICAL DATA:  Patient with history of an ulceration on the lateral aspect of the right foot over the last few months. Status post resection of the proximal fifth metatarsal, debridement of necrotic tissue and placement of antibiotic impregnated beads 05/08/2016. Patient admitted to the hospital 06/01/2016 with fever. EXAM: MRI OF THE RIGHT FOREFOOT WITHOUT CONTRAST TECHNIQUE: Multiplanar, multisequence MR imaging of the right foot was performed. No intravenous contrast was administered. COMPARISON:  MRI right foot 05/08/2016.  Plain films this same day. FINDINGS: Surgical wound is seen over the lateral foot with underlying antibiotic impregnated beads in place. The proximal fifth metatarsal has been resected. No marrow edema is seen in the remnant of the fifth metatarsal. There is marrow edema throughout the cuboid and lateral cuneiform which is more extensive and intense than that seen on the prior MRI are. Marrow edema is also seen in the proximal 2 cm of the second and third metatarsals, markedly increased compared the prior MRI. Milder degree of marrow edema is seen in subchondral bone of the navicular and medial and middle cuneiforms and subchondral bone of  the base of the first metatarsal. Edema throughout intrinsic musculature the foot does not appear markedly changed compared to the prior MRI. Subcutaneous edema is seen about the ankle and foot. No focal fluid collection is seen. IMPRESSION: Marrow edema in the lateral cuneiform, cuboid and proximal second and third metatarsals is more extensive and intense than that seen on the prior MRI and worrisome for osteomyelitis. Milder degree of marrow edema mainly in subchondral bone of the proximal fourth metatarsal, navicular, medial and middle cuneiforms and first metatarsal also appears increased compared to the prior study and could be due to osteomyelitis or reactive change. Status post resection of the proximal fifth metatarsal with antibiotic impregnated beads in place. Signal in the remnant of the fifth metatarsal is normal. Large wound at the surgical site.  Negative for abscess. Edema throughout all intrinsic musculature the foot is unchanged and could be inflammatory or due to early atrophy. Electronically Signed   By: Drusilla Kanner M.D.   On: 06/02/2016 13:57   Dg Foot Complete Right  Result Date: 06/02/2016 CLINICAL DATA:  Patient with history of an ulceration on the lateral aspect of the right foot over the last few months. Status post resection of the proximal fifth metatarsal, debridement  of necrotic tissue and placement of antibiotic impregnated beads 05/08/2016. Patient admitted to the hospital 06/01/2016 with fever. EXAM: RIGHT FOOT COMPLETE - 3+ VIEW COMPARISON:  Plain films left foot 05/03/2016. MRI left ankle 05/08/2016. FINDINGS: Postoperative change of resection of the proximal fifth metatarsal and placement of antibiotic impregnated beads is identified. Soft tissues about the foot appear mildly swollen. Skin ulceration versus marked thinning of cutaneous tissues over the surgical site is identified. No soft tissue gas collection or bony destructive change. IMPRESSION: Status post resection  of proximal fifth metatarsal and placement of antibiotic impregnated beads. No plain film evidence of osteomyelitis or other acute bony abnormality. Skin over the patient's surgical site appears markedly thinned. Electronically Signed   By: Drusilla Kannerhomas  Dalessio M.D.   On: 06/02/2016 13:01     CBC  Recent Labs Lab 06/06/16 0941 06/07/16 1053 06/08/16 0324 06/09/16 0329 06/10/16 0403  WBC 5.6 8.1 8.4 9.5 9.3  HGB 8.9* 8.7* 8.0* 7.5* 7.1*  HCT 26.2* 25.0* 23.7* 22.3* 21.1*  PLT 295 310 305 323 341  MCV 90.5 90.4 91.8 92.0 92.0  MCH 30.7 31.2 30.8 30.8 30.8  MCHC 33.9 34.6 33.5 33.5 33.5  RDW 16.5* 16.6* 16.5* 16.7* 16.8*  LYMPHSABS  --  1.1  --   --   --   MONOABS  --  0.6  --   --   --   EOSABS  --  0.1  --   --   --   BASOSABS  --  0.0  --   --   --     Chemistries   Recent Labs Lab 06/04/16 0956 06/05/16 0500 06/06/16 0941 06/07/16 1053 06/08/16 0324  NA 138 137 139 137 139  K 3.5 3.6 4.0 4.0 3.9  CL 110 109 109 110 110  CO2 23 23 22 22  21*  GLUCOSE 159* 160* 163* 157* 159*  BUN 27* 27* 22* 22* 21*  CREATININE 1.36* 1.18 1.22 1.16 1.11  CALCIUM 7.9* 8.0* 8.3* 8.0* 8.2*   ------------------------------------------------------------------------------------------------------------------ estimated creatinine clearance is 60 mL/min (by C-G formula based on SCr of 1.11 mg/dL). ------------------------------------------------------------------------------------------------------------------ No results for input(s): HGBA1C in the last 72 hours. ------------------------------------------------------------------------------------------------------------------ No results for input(s): CHOL, HDL, LDLCALC, TRIG, CHOLHDL, LDLDIRECT in the last 72 hours. ------------------------------------------------------------------------------------------------------------------ No results for input(s): TSH, T4TOTAL, T3FREE, THYROIDAB in the last 72 hours.  Invalid input(s):  FREET3 ------------------------------------------------------------------------------------------------------------------ No results for input(s): VITAMINB12, FOLATE, FERRITIN, TIBC, IRON, RETICCTPCT in the last 72 hours.  Coagulation profile No results for input(s): INR, PROTIME in the last 168 hours.  No results for input(s): DDIMER in the last 72 hours.  Cardiac Enzymes No results for input(s): CKMB, TROPONINI, MYOGLOBIN in the last 168 hours.  Invalid input(s): CK ------------------------------------------------------------------------------------------------------------------ Invalid input(s): POCBNP    Assessment & Plan   66 year old male with history of peripheral vascular disease status post stenting and PTCA in July, CAD, can make kidney disease stage III and recent hospitalization for right foot nonhealing ulcer with osteomyelitis who presents with chest pain and sepsis.  1. Sepsis: Patient presents with fever and tachypnea Source right foot Status post below-knee amputation of the right leg the source of infection has been removed Continue Augmentin for 3 days then stop  Temp in 99 range   2. Acute blood loss anemia on chronic anemia hemoglobin  Sent hemoglobin continues to trend downward I will transfuse him 1 unit of packed RBCs. Patient explained risk and benefits is agreeable to transfusion   3. Accelerated hypertension: Continue  Coreg and add ACE inhibitor blood pressure stable   4. Chronic kidney disease stage III continue monitor stable Asian had urinary retention I'll start him on Flomax  5. Diabetes:   Continue ssi , blood sugar still elevated I will increase his  6. Urinary retention patient started on Flomax continue bladder scan may need in and out catheter if urine retention greater than 350 cc   Patient can be discharged to a skilled nursing facility tomorrow will monitor CBC     Code Status Orders        Start     Ordered    06/01/16 1542  Full code  Continuous     06/01/16 1541    Code Status History    Date Active Date Inactive Code Status Order ID Comments User Context   05/03/2016 10:54 PM 05/11/2016  6:08 PM Full Code [id number]  Enedina Finner, MD Inpatient   03/27/2016  7:34 PM 03/29/2016  5:47 PM Full Code [id number]  Enedina Finner, MD Inpatient   02/14/2016  6:10 PM 02/15/2016  5:03 PM Full Code [id number]  Alford Highland, MD ED   01/13/2016  9:42 AM 01/17/2016 11:47 PM Full Code [id number]  Eugenie Norrie, NP ED           Consults  Podiatry and cardiology  DVT Prophylaxis  Lovenox   Lab Results  Component Value Date   PLT 341 06/10/2016     Time Spent in minutes   Greater than 50% of time spent in care coordination and counseling patient regarding the condition and plan of care.   Auburn Bilberry M.D on 06/10/2016 at 11:51 AM  Between 7am to 6pm - Pager - [phone]  After 6pm go to [email or web address] - password [email or web address] [facility]  [facility]   Office  [phone]

## 2016-06-10 NOTE — Progress Notes (Signed)
Pt had episodes of choking/coughing this shift with fluids and food. Unwitnessed choking with meal (pt reported), bedside swallow eval completed, no choking noted with applesauce or water. However, when giving pt orange juice observed patient choking. Pt states he "does this sometimes". Order obtained for SLP eval, diet changed to Dys 1 with honey thick, maintain aspiration precautions. CXR ordered per MD. VS stable, O2 sats 98-100%. Will continue to monitor. Pending speech eval on Monday (no coverage on sundays)

## 2016-06-10 NOTE — Progress Notes (Signed)
4 Days Post-Op  Subjective: Doing OK. Without complaints. Denies Pain.  Objective: Vital signs in last 24 hours: Temp:  [97.7 F (36.5 C)-100 F (37.8 C)] 97.7 F (36.5 C) (03/04 0729) Pulse Rate:  [73-82] 76 (03/04 0729) Resp:  [16-18] 16 (03/04 0729) BP: (113-135)/(53-59) 135/59 (03/04 0729) SpO2:  [99 %-100 %] 100 % (03/04 0729) Last BM Date: 06/06/16  Intake/Output from previous day: 03/03 0701 - 03/04 0700 In: 1200 [P.O.:1200] Out: 200 [Urine:200] Intake/Output this shift: No intake/output data recorded.  General appearance: alert and cooperative Extremities: warm, stump soft, incision-C/D/I  Lab Results:   Recent Labs  06/09/16 0329 06/10/16 0403  WBC 9.5 9.3  HGB 7.5* 7.1*  HCT 22.3* 21.1*  PLT 323 341   BMET  Recent Labs  06/07/16 1053 06/08/16 0324  NA 137 139  K 4.0 3.9  CL 110 110  CO2 22 21*  GLUCOSE 157* 159*  BUN 22* 21*  CREATININE 1.16 1.11  CALCIUM 8.0* 8.2*   PT/INR No results for input(s): LABPROT, INR in the last 72 hours. ABG No results for input(s): PHART, HCO3 in the last 72 hours.  Invalid input(s): PCO2, PO2  Studies/Results: No results found.  Anti-infectives: Anti-infectives    Start     Dose/Rate Route Frequency Ordered Stop   06/07/16 1200  amoxicillin-clavulanate (AUGMENTIN) 875-125 MG per tablet 1 tablet     1 tablet Oral Every 12 hours 06/07/16 1031     06/01/16 1600  piperacillin-tazobactam (ZOSYN) IVPB 3.375 g  Status:  Discontinued     3.375 g 12.5 mL/hr over 240 Minutes Intravenous Every 8 hours 06/01/16 1549 06/07/16 1031      Assessment/Plan: s/p Procedure(s): AMPUTATION BELOW KNEE (Right) OK for discharge from vascular standpoint  Daily dressing changes Follow Up in 3-4 weeks in office   LOS: 9 days    Esco, Miechia A 06/10/2016

## 2016-06-11 ENCOUNTER — Inpatient Hospital Stay: Payer: Commercial Managed Care - PPO

## 2016-06-11 LAB — CBC
HEMATOCRIT: 24.7 % — AB (ref 40.0–52.0)
HEMOGLOBIN: 8.3 g/dL — AB (ref 13.0–18.0)
MCH: 30.6 pg (ref 26.0–34.0)
MCHC: 33.7 g/dL (ref 32.0–36.0)
MCV: 90.9 fL (ref 80.0–100.0)
PLATELETS: 345 10*3/uL (ref 150–440)
RBC: 2.71 MIL/uL — ABNORMAL LOW (ref 4.40–5.90)
RDW: 16.4 % — ABNORMAL HIGH (ref 11.5–14.5)
WBC: 9.4 10*3/uL (ref 3.8–10.6)

## 2016-06-11 LAB — BASIC METABOLIC PANEL
ANION GAP: 4 — AB (ref 5–15)
BUN: 32 mg/dL — AB (ref 6–20)
CHLORIDE: 110 mmol/L (ref 101–111)
CO2: 23 mmol/L (ref 22–32)
Calcium: 7.9 mg/dL — ABNORMAL LOW (ref 8.9–10.3)
Creatinine, Ser: 1.12 mg/dL (ref 0.61–1.24)
GFR calc Af Amer: >60 mL/min (ref >60)
GLUCOSE: 186 mg/dL — AB (ref 65–99)
Potassium: 4.3 mmol/L (ref 3.5–5.1)
Sodium: 137 mmol/L (ref 135–145)

## 2016-06-11 LAB — BPAM RBC
Blood Product Expiration Date: 201803272359
ISSUE DATE / TIME: 201803041437
Unit Type and Rh: 5100

## 2016-06-11 LAB — TYPE AND SCREEN
ABO/RH(D): O POS
ANTIBODY SCREEN: NEGATIVE
Unit division: 0

## 2016-06-11 LAB — GLUCOSE, CAPILLARY
GLUCOSE-CAPILLARY: 301 mg/dL — AB (ref 65–99)
Glucose-Capillary: 260 mg/dL — ABNORMAL HIGH (ref 65–99)
Glucose-Capillary: 246 mg/dL — ABNORMAL HIGH (ref 65–99)

## 2016-06-11 MED ORDER — AMOXICILLIN-POT CLAVULANATE 875-125 MG PO TABS: 1.0000 | ORAL_TABLET | Freq: Two times a day (BID) | ORAL | 0 refills | Status: DC

## 2016-06-11 MED ORDER — CLOPIDOGREL BISULFATE 75 MG PO TABS: 75.0000 mg | ORAL_TABLET | Freq: Every day | ORAL | 0 refills | Status: AC

## 2016-06-11 MED ORDER — CARVEDILOL 12.5 MG PO TABS: 12.5000 mg | ORAL_TABLET | Freq: Two times a day (BID) | ORAL | 30 refills | Status: AC

## 2016-06-11 MED ORDER — HYDRALAZINE HCL 25 MG PO TABS: 25.0000 mg | ORAL_TABLET | Freq: Three times a day (TID) | ORAL | 0 refills | Status: AC

## 2016-06-11 MED ORDER — LISINOPRIL 40 MG PO TABS: 40.0000 mg | ORAL_TABLET | Freq: Every day | ORAL | 0 refills | Status: DC

## 2016-06-11 MED ORDER — TAMSULOSIN HCL 0.4 MG PO CAPS: 0.4000 mg | ORAL_CAPSULE | Freq: Every day | ORAL | 0 refills | Status: AC

## 2016-06-11 MED ORDER — GLUCERNA SHAKE PO LIQD: 237.0000 mL | Freq: Three times a day (TID) | ORAL | 0 refills | Status: DC

## 2016-06-11 NOTE — Progress Notes (Signed)
Pt. Bladder scan is 403. Dr. Tobi BastosPyreddy gave order to in and out cath pt. x1.

## 2016-06-11 NOTE — Progress Notes (Signed)
This Clinical research associatewriter dc'd pt's PICC line with catheter intact. Pt tolerated well. This Clinical research associatewriter called report to [employer] and gave report to CoquilleBrittany,RN. Pt dc'd via EMS and stretcher at 1730.

## 2016-06-11 NOTE — Progress Notes (Signed)
Physical Therapy Treatment Patient Details Name: Chaney BornJoel L Orsborn MRN: [id number] DOB: 12-04-1950 Today's Date: 06/11/2016    History of Present Illness Patient is a 66 y.o. male admitted on 23 FEB with chest pain and fever. 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  PMH includes CKDIII, CAD, and PAD.    PT Comments    Pt awake, ready for session.  Participated in exercises as described below.  To edge of bed with mod a x 1.  Improved static sitting balance today but continues to require +1 min guard/min assist at times.  Sit to stand x 5 with walker with overall poor quality.  Pt required max a x 2 with heavy lean to right.  Blocking of L foot required to prevent sliding on floor.  Pt unable to attempt stand pivot transfer to chair at this time due to pt and staff safety.  He is lift appropriate for transfers.  He continues to have difficulty with sitting balance and lateral scooting which would make sliding board transfers difficult.     Follow Up Recommendations  SNF     Equipment Recommendations       Recommendations for Other Services       Precautions / Restrictions Precautions Precautions: Fall Restrictions Weight Bearing Restrictions: Yes RLE Weight Bearing: Non weight bearing Other Position/Activity Restrictions: New RLE BKA    Mobility  Bed Mobility Overal bed mobility: Needs Assistance Bed Mobility: Supine to Sit;Sit to Supine Rolling: Mod assist   Supine to sit: HOB elevated Sit to supine: Mod assist   General bed mobility comments: requires cues fo hand placements and to assist with task.  unable to get shoulders off bed without assist and HOB raised.  Uses trapeeze well to repostiton in supine.  Transfers Overall transfer level: Needs assistance Equipment used: Rolling walker (2 wheeled) Transfers: Sit to/from  Stand Sit to Stand: Max assist;+2 physical assistance         General transfer comment: 5 sit to stand trials.  Pt leans heavily to right, left foot slides and requires blocking.  Unasfe to attempt stand pivot to chair.  Ambulation/Gait             General Gait Details: Not appropriate at this time.   Stairs            Wheelchair Mobility    Modified Rankin (Stroke Patients Only)       Balance                                    Cognition Arousal/Alertness: Awake/alert Behavior During Therapy: WFL for tasks assessed/performed Overall Cognitive Status: No family/caregiver present to determine baseline cognitive functioning                      Exercises Other Exercises Other Exercises: supine exercies BLE x 10 for ankle pumps, heel slides, SLR, ab/ad, SAQ - pt able to do all exercies without assist today.  fatigued quickly    General Comments        Pertinent Vitals/Pain      Home Living                      Prior Function  PT Goals (current goals can now be found in the care plan section)      Frequency    7X/week      PT Plan Current plan remains appropriate    Co-evaluation             End of Session Equipment Utilized During Treatment: Gait belt Activity Tolerance: Patient limited by fatigue Patient left: in bed;with call bell/phone within reach;with bed alarm set         Time: 1610-9604 PT Time Calculation (min) (ACUTE ONLY): 24 min  Charges:  $Therapeutic Exercise: 8-22 mins $Therapeutic Activity: 8-22 mins                    G Codes:       Danielle Dess 07-Jul-2016, 11:39 AM

## 2016-06-11 NOTE — Evaluation (Signed)
Clinical/Bedside Swallow Evaluation Patient Details  Name: Damon [name] MRN: [id number] Date of Birth: [birth year]  Today's Date: 06/11/2016 Time: SLP Start Time (ACUTE ONLY): 0900 SLP Stop Time (ACUTE ONLY): 1000 SLP Time Calculation (min) (ACUTE ONLY): 60 min  Past Medical History:  Past Medical History:  Diagnosis Date  . CAD (coronary artery disease)    a. 01/2016 MI/PCI: LM nl, LAD 134m/d (3.25 x 28 Xience DES), 40d, LCX 30ost, OM1 95 (staged - 2.75 x 18 Xience Alpine DES), OM2/3 min irregs, RCA 106m.  . Chronic combined systolic and diastolic CHF (congestive heart failure) (HCC)    a. 01/2016 Echo: Ef 25-30%, Gr1 DD, mild AI, mildly dil LA;  b. 02/2016 Echo: EF 30-35%, apical AK, antsept and ant HK, nl RV fxn.  . CKD (chronic kidney disease), stage III   . COPD (chronic obstructive pulmonary disease) (HCC)   . Diabetes mellitus without complication (HCC)   . Diverticulitis   . Essential hypertension   . Ischemic cardiomyopathy    a. 01/2016 Echo: Ef 25-30%;  b. 02/2016 Echo: EF 30-35%.  Marland Kitchen LBBB (left bundle branch block)   . PAD (peripheral artery disease) (HCC)    a. 10/2015 s/p PTA and stenting of the RCIA, LCIA, and PTA of the LEIA (Schnier); b. 04/2016 PTA or R tibioperoneal trunk & prox PT, PTA/DBA of R Pop and SFA, Viabahn covered stent x 2 to the R SFA and pop (5mm x 25 cm & 5mm x 15cm).  . Tobacco abuse    Past Surgical History:  Past Surgical History:  Procedure Laterality Date  . AMPUTATION Right 06/06/2016   Procedure: AMPUTATION BELOW KNEE;  Surgeon: Annice Needy, MD;  Location: [facility] OR[facility];  Service: General;  Laterality: Right;  . CARDIAC CATHETERIZATION N/A 01/13/2016   Procedure: LEFT HEART CATH AND CORONARY ANGIOGRAPHY;  Surgeon: Iran Ouch, MD;  Location: [facility];  Service: Cardiovascular;  Laterality: N/A;  . CARDIAC CATHETERIZATION N/A 01/13/2016   Procedure: Coronary Stent Intervention;  Surgeon: Iran Ouch, MD;  Location: [facility];   Service: Cardiovascular;  Laterality: N/A;  . CARDIAC CATHETERIZATION N/A 01/16/2016   Procedure: Left Heart Cath and Coronary Angiography;  Surgeon: Iran Ouch, MD;  Location: [facility];  Service: Cardiovascular;  Laterality: N/A;  . IRRIGATION AND DEBRIDEMENT FOOT Right 05/08/2016   Procedure: IRRIGATION AND DEBRIDEMENT FOOT WITH PLACEMENT OF ANTIBIOTIC BEADS;  Surgeon: Linus Galas, DPM;  Location: [facility];  Service: Podiatry;  Laterality: Right;  . NECK SURGERY    . PERIPHERAL VASCULAR CATHETERIZATION Left 11/01/2015   Procedure: Lower Extremity Angiography;  Surgeon: Renford Dills, MD;  Location: [facility];  Service: Cardiovascular;  Laterality: Left;  . PERIPHERAL VASCULAR CATHETERIZATION Right 05/07/2016   Procedure: Lower Extremity Angiography;  Surgeon: Annice Needy, MD;  Location: [facility];  Service: Cardiovascular;  Laterality: Right;   HPI:  Pt  is a 66 y.o. 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 Patient reports that approximately 9:00 this morning while he was in bed he developed chest pain/pressure without radiation to his arm or jaw. This was not relieved until he was brought to the emergency room and he received nitroglycerin and his blood pressure came down. Systolic blood  pressure was greater than 200 upon EMS arrival. 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 NSG noted coughing w/ po's over the weekend and placed pt on a dysphagia diet w/ thickened liquids until BSE. NSG reported adequate toleration of Pills in Puree; small sips of water this morning. Pt awake, alert and followed general instruction w/ few cues; verbally responsive when spoken to.    Assessment / Plan / Recommendation Clinical Impression  Pt appears to present w/  an adequate oropharyngeal phase swallow function w/ no apparent oropharyngeal phase dysphagia noted w/ trial consistencies given. Pt consumed trials of thin liquids, puree and soft solids(moistened well d/t lacking dentition) w/ no immediate, overt s/s of aspiration noted; adequate oral management and clearing of all bolus trials - pt did require min extra time w/ solids/increased texture d/t missing dentition. Pt fed self holding the cup for drinking taking his time d/t shaky UEs. Oral Motor exam appeared wfl w/ no lingual/labial weakness noted. Pt appears at his baseline for swallowing following general aspiration precautions - min weakness overall d/t illness and BKA. Pt is verbally communicating and following direct instructions w/ gentle cues/time. Recommend modifying diet to a mech soft w/ thin liquids w/ general aspiration precautions and assistance at meals; Pills in Puree - whole as able. ST will f/u w/ toleration of diet and modify as indicated.  SLP Visit Diagnosis: Dysphagia, oropharyngeal phase (R13.12)    Aspiration Risk   (reduced following general aspiration precautions)    Diet Recommendation  Mech Soft (dysphagia level 3) w/ thin liquids; general aspiration precautions; assistance at meals as needed d/t overall weakness  Medication Administration: Whole meds with puree    Other  Recommendations Recommended Consults:  (Dietician f/u) Oral Care Recommendations: Oral care BID;Staff/trained caregiver to provide oral care   Follow up Recommendations None (TBD)      Frequency and Duration min 2x/week  1 week       Prognosis Prognosis for Safe Diet Advancement: Fair (-Good) Barriers to Reach Goals:  (deconditioning)      Swallow Study   General Date of Onset: 06/01/16 HPI: Pt  is a 66 y.o. male with a known history of Enterobacter and group B strep right foot nonhealing ulcer with osteomyelitis currently on Zosyn, chronic kidney disease stage III, CAD and peripheral arterial  disease who presents with chest pain and found to have a fever of 100.6. Patient reports that approximately 9:00 this morning while he was in bed he developed chest pain/pressure without radiation to his arm or jaw. This was not relieved until he was brought to the emergency room and he received nitroglycerin and his blood pressure came down. Systolic blood pressure was greater than 200 upon EMS arrival. 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 NSG noted coughing w/ po's over the weekend and placed pt on a dysphagia diet w/ thickened liquids until BSE. NSG reported adequate toleration of Pills in Puree; small sips of water this morning. Pt awake, alert and followed general instruction w/ few cues; verbally responsive when spoken to.  Type of Study: Bedside Swallow Evaluation Previous Swallow Assessment: none reported Diet Prior to this Study: Regular;Thin liquids (prior to hospitalization) Temperature Spikes Noted: No (wbc 9.4; not elevated) Respiratory Status: Room air History of Recent Intubation: No Behavior/Cognition: Alert;Cooperative;Pleasant mood;Distractible;Requires cueing Oral Cavity Assessment: Within Functional Limits;Dry Oral Care Completed by SLP: Recent completion by staff Oral Cavity - Dentition: Missing dentition;Poor condition Vision: Functional for self-feeding Self-Feeding Abilities: Able to feed  self;Needs assist;Needs set up (shaky UEs) Patient Positioning: Upright in bed Baseline Vocal Quality: Normal;Low vocal intensity Volitional Cough: Strong Volitional Swallow: Able to elicit    Oral/Motor/Sensory Function Overall Oral Motor/Sensory Function: Within functional limits   Ice Chips Ice chips: Within functional limits Presentation: Spoon (fed; 3 trials)   Thin Liquid Thin Liquid: Within functional limits Presentation: Cup;Self Fed;Straw (10 trials; 3 trials via straw) Other Comments: shaky UEs    Nectar Thick Nectar Thick Liquid: Not tested   Honey Thick Honey Thick  Liquid: Not tested   Puree Puree: Within functional limits Presentation: Spoon (fed; 8 trials)   Solid   GO   Solid: Within functional limits (mech soft; moistened) Presentation: Spoon (fed; 3 trials) Other Comments: missing dentition         Jerilynn Som, MS, CCC-SLP Jerimey Burridge 06/11/2016,1:20 PM

## 2016-06-11 NOTE — Progress Notes (Signed)
Inpatient Diabetes Program Recommendations  AACE/ADA: New Consensus Statement on Inpatient Glycemic Control (2015)  Target Ranges:  Prepandial:   less than 140 mg/dL      [facility] postprandial:   less than 180 mg/dL (1-2 hours)      Critically ill patients:  140 - 180 mg/dL   Lab Results  Component Value Date   GLUCAP 301 (H) 06/11/2016   HGBA1C 14.2 (H) 05/03/2016    Review of Glycemic Control:  Results for Damon [name], Damon [name] (MRN [id number]) as of 06/11/2016 13:13  Ref. Range 06/10/2016 11:40 06/10/2016 16:32 06/10/2016 21:19 06/11/2016 07:28 06/11/2016 11:30  Glucose-Capillary Latest Ref Range: 65 - 99 mg/dL 865244 (H) 784260 (H) 696284 (H) 260 (H) 301 (H)    Diabetes history: Type 2 diabetes Outpatient Diabetes medications: Lantus 10 units q HS, Metformin 500 mg bid Current orders for Inpatient glycemic control:  Novolog sensitive tid with meals and HS, Lantus 8 units q HS  Inpatient Diabetes Program Recommendations:    Please consider increasing Lantus to 12 units q HS. Also consider adding Novolog meal coverage 4 units tid with meals- Hold if patient eats less than 50%.   Thanks, Beryl MeagerJenny Jervon Ream, RN, BC-ADM Inpatient Diabetes Coordinator Pager [phone] (8a-5p)

## 2016-06-11 NOTE — Progress Notes (Signed)
Shift assessment completed. Pt is resting in bed, is awake, alert and oriented. Pt has flat affect, is on room air, respirations are unlabored, lungs are clear, decreased to bases a little. Hr is regular when auscultated. Abdomen is soft, bs heard. Pt has bandage with ace wrap intact to r leg, leg is elevated on pillow, pt is R bka. L ppp. PICC line is intact to RUA, site is free of redness and swelling. Pt c/o pain and received tylenol po. SLP is in with pt at this time. Pt was given 0800 meds with ice cream, coughed after swallowing. Hob is elevated, call bell in reach.

## 2016-06-11 NOTE — Plan of Care (Signed)
Problem: Skin Integrity: Goal: Demonstration of wound healing without infection will improve Outcome: Completed/Met Date Met: 06/11/16 Pt has met goals adequately for discharge to snf.   

## 2016-06-11 NOTE — Progress Notes (Signed)
Pt has not voided this shift. Pt was given 500mls po fluids and pt continued to state he could not void. This writer bladder scanned pt for 553mls. Dr. Enedina Finnersona Patel was paged and notified of this,. Received order to in and out cath pt. This was done for 400mls yellow urine, appeared clear.

## 2016-06-11 NOTE — Clinical Social Work Note (Signed)
Pt is ready for discharge today and will return to [facility]. Pt and wife are aware and agreeable to discharge plan. Facility has received discharge information and is ready to admit pt. RN will call report. [facility] CSW is signing off as no further needs identified.   Dede QuerySarah Aidah Forquer, MSW, LCSW  Clinical Social Worker  [phone]

## 2016-06-11 NOTE — Discharge Summary (Signed)
[facility] [place] [facility]   PATIENT NAME: Damon [name]    MR#:  [id number]  DATE OF BIRTH:  1950-09-01  DATE OF ADMISSION:  06/01/2016 ADMITTING PHYSICIAN: Adrian Saran, MD  DATE OF DISCHARGE: 06/11/2016  PRIMARY CARE PHYSICIAN: Emogene Morgan, MD    ADMISSION DIAGNOSIS:  Atypical chest pain [R07.89]  DISCHARGE DIAGNOSIS:  Sepsis due to right foot infection s/p right BKA Acute on chronic anemia s/p BT CKD-III DM-2 HTN H/o Urinary retention  SECONDARY DIAGNOSIS:   Past Medical History:  Diagnosis Date  . CAD (coronary artery disease)    a. 01/2016 MI/PCI: LM nl, LAD 151m/d (3.25 x 28 Xience DES), 40d, LCX 30ost, OM1 95 (staged - 2.75 x 18 Xience Alpine DES), OM2/3 min irregs, RCA 78m.  . Chronic combined systolic and diastolic CHF (congestive heart failure) (HCC)    a. 01/2016 Echo: Ef 25-30%, Gr1 DD, mild AI, mildly dil LA;  b. 02/2016 Echo: EF 30-35%, apical AK, antsept and ant HK, nl RV fxn.  . CKD (chronic kidney disease), stage III   . COPD (chronic obstructive pulmonary disease) (HCC)   . Diabetes mellitus without complication (HCC)   . Diverticulitis   . Essential hypertension   . Ischemic cardiomyopathy    a. 01/2016 Echo: Ef 25-30%;  b. 02/2016 Echo: EF 30-35%.  Marland Kitchen LBBB (left bundle branch block)   . PAD (peripheral artery disease) (HCC)    a. 10/2015 s/p PTA and stenting of the RCIA, LCIA, and PTA of the LEIA (Schnier); b. 04/2016 PTA or R tibioperoneal trunk & prox PT, PTA/DBA of R Pop and SFA, Viabahn covered stent x 2 to the R SFA and pop (5mm x 25 cm & 5mm x 15cm).  . Tobacco abuse     HOSPITAL COURSE:   66 year old male with history of peripheral vascular disease status post stenting and PTCA in July, CAD, can make kidney disease stage III and recent hospitalization for right foot nonhealing ulcer with osteomyelitis who presents with chest pain and sepsis.  1. Sepsis: Patient presents with fever and tachypnea Source right  foot Status post below-knee amputation of the right leg the source of infection has been removed Continue Augmentin for 3 days then stop -pt has h/o PVD on po asa and plavix F/u vascular as out pt  2. Acute blood loss anemia on chronic anemia hemoglobin  Sent hemoglobin continues to trend downward I will transfuse him 1 unit of packed RBCs. Patient explained risk and benefits is agreeable to transfusion  3. Accelerated hypertension: Continue Coreg, hydralazine and  ACE inhibitor blood pressure stable  4. Chronic kidney disease stage III continue monitor stable Creat stable  5. Diabetes:   Continue ssi and lantus  6. Urinary retention patient started on Flomax continue bladder scan may need in and out catheter if urine retention greater than 350 cc  Overall better and improving [facility]  CONSULTS OBTAINED:  Treatment Team:  Linus Galas, DPM Mick Sell, MD Bertram Denver, MD  DRUG ALLERGIES:  No Known Allergies  DISCHARGE MEDICATIONS:   Current Discharge Medication List    START taking these medications   Details  amoxicillin-clavulanate (AUGMENTIN) 875-125 MG tablet Take 1 tablet by mouth every 12 (twelve) hours. Qty: 6 tablet, Refills: 0    clopidogrel (PLAVIX) 75 MG tablet Take 1 tablet (75 mg total) by mouth daily. Qty: 30 tablet, Refills: 0    feeding supplement, GLUCERNA SHAKE, (GLUCERNA SHAKE) LIQD Take  237 mLs by mouth 3 (three) times daily between meals. Qty: 30 Can, Refills: 0    hydrALAZINE (APRESOLINE) 25 MG tablet Take 1 tablet (25 mg total) by mouth every 8 (eight) hours. Qty: 30 tablet, Refills: 0    lisinopril (PRINIVIL,ZESTRIL) 40 MG tablet Take 1 tablet (40 mg total) by mouth daily. Qty: 30 tablet, Refills: 0    tamsulosin (FLOMAX) 0.4 MG CAPS capsule Take 1 capsule (0.4 mg total) by mouth daily. Qty: 30 capsule, Refills: 0      CONTINUE these medications which have CHANGED   Details  carvedilol (COREG) 12.5 MG  tablet Take 1 tablet (12.5 mg total) by mouth 2 (two) times daily with a meal. Qty: 60 tablet, Refills: 30      CONTINUE these medications which have NOT CHANGED   Details  aspirin EC 81 MG tablet Take 1 tablet (81 mg total) by mouth daily. Qty: 150 tablet, Refills: 2    atorvastatin (LIPITOR) 80 MG tablet Take 1 tablet (80 mg total) by mouth daily at 6 PM. Qty: 30 tablet, Refills: 2    ferrous sulfate 325 (65 FE) MG tablet Take 1 tablet (325 mg total) by mouth 3 (three) times daily with meals. Refills: 2    insulin glargine (LANTUS) 100 UNIT/ML injection Inject 0.1 mLs (10 Units total) into the skin at bedtime. Qty: 10 mL, Refills: 0    metFORMIN (GLUCOPHAGE) 500 MG tablet Take by mouth 2 (two) times daily with a meal.    omeprazole (PRILOSEC) 20 MG capsule Take 20 mg by mouth daily.    ondansetron (ZOFRAN) 4 MG tablet Take 4 mg by mouth every 8 (eight) hours as needed for nausea or vomiting.    sennosides-docusate sodium (SENOKOT-S) 8.6-50 MG tablet Take 1 tablet by mouth daily.    acetaminophen (TYLENOL) 500 MG tablet Take 500 mg by mouth every 6 (six) hours as needed.    albuterol-ipratropium (COMBIVENT) 18-103 MCG/ACT inhaler Inhale into the lungs every 4 (four) hours as needed for wheezing or shortness of breath.    gabapentin (NEURONTIN) 300 MG capsule Take 300 mg by mouth daily as needed.     oxyCODONE-acetaminophen (PERCOCET/ROXICET) 5-325 MG tablet Take 1 tablet by mouth every 6 (six) hours as needed for severe pain. Qty: 12 tablet, Refills: 0      STOP taking these medications     piperacillin-tazobactam (ZOSYN) 3.375 GM/50ML IVPB      ticagrelor (BRILINTA) 90 MG TABS tablet         If you experience worsening of your admission symptoms, develop shortness of breath, life threatening emergency, suicidal or homicidal thoughts you must seek medical attention immediately by calling 911 or calling your MD immediately  if symptoms less severe.  You Must read  complete instructions/literature along with all the possible adverse reactions/side effects for all the Medicines you take and that have been prescribed to you. Take any new Medicines after you have completely understood and accept all the possible adverse reactions/side effects.   Please note  You were cared for by a hospitalist during your hospital stay. If you have any questions about your discharge medications or the care you received while you were in the hospital after you are discharged, you can call the unit and asked to speak with the hospitalist on call if the hospitalist that took care of you is not available. Once you are discharged, your primary care physician will handle any further medical issues. Please note that NO REFILLS for any  discharge medications will be authorized once you are discharged, as it is imperative that you return to your primary care physician (or establish a relationship with a primary care physician if you do not have one) for your aftercare needs so that they can reassess your need for medications and monitor your lab values. Today   SUBJECTIVE   Doing ok  VITAL SIGNS:  Blood pressure (!) 128/58, pulse 85, temperature 98.5 F (36.9 C), temperature source Oral, resp. rate 16, height 5\' 10"  (1.778 m), weight 63.1 kg (139 lb 1.6 oz), SpO2 98 %.  I/O:   Intake/Output Summary (Last 24 hours) at 06/11/16 1510 Last data filed at 06/11/16 0257  Gross per 24 hour  Intake              300 ml  Output              350 ml  Net              -50 ml    PHYSICAL EXAMINATION:  GENERAL:  66 y.o.-year-old patient lying in the bed with no acute distress.  EYES: Pupils equal, round, reactive to light and accommodation. No scleral icterus. Extraocular muscles intact.  HEENT: Head atraumatic, normocephalic. Oropharynx and nasopharynx clear.  NECK:  Supple, no jugular venous distention. No thyroid enlargement, no tenderness.  LUNGS: Normal breath sounds bilaterally, no  wheezing, rales,rhonchi or crepitation. No use of accessory muscles of respiration.  CARDIOVASCULAR: S1, S2 normal. No murmurs, rubs, or gallops.  ABDOMEN: Soft, non-tender, non-distended. Bowel sounds present. No organomegaly or mass.  EXTREMITIES: No pedal edema, cyanosis, or clubbing. Right BKA++ NEUROLOGIC: Cranial nerves II through XII are intact. Muscle strength 5/5 in all extremities. Sensation intact. Gait not checked.  PSYCHIATRIC: The patient is alert and oriented x 3.  SKIN: No obvious rash, lesion, or ulcer.   DATA REVIEW:   CBC   Recent Labs Lab 06/11/16 0328  WBC 9.4  HGB 8.3*  HCT 24.7*  PLT 345    Chemistries   Recent Labs Lab 06/11/16 0328  NA 137  K 4.3  CL 110  CO2 23  GLUCOSE 186*  BUN 32*  CREATININE 1.12  CALCIUM 7.9*    Microbiology Results   Recent Results (from the past 240 hour(s))  Culture, blood (Routine X 2) w Reflex to ID Panel     Status: None   Collection Time: 06/01/16  4:11 PM  Result Value Ref Range Status   Specimen Description BLOOD LEFT ASSIST CONTROL  Final   Special Requests   Final    BOTTLES DRAWN AEROBIC AND ANAEROBIC ANA11ML AER10ML   Culture NO GROWTH 5 DAYS  Final   Report Status 06/06/2016 FINAL  Final  Culture, blood (Routine X 2) w Reflex to ID Panel     Status: None   Collection Time: 06/01/16  4:12 PM  Result Value Ref Range Status   Specimen Description BLOOD ALINE  Final   Special Requests   Final    BOTTLES DRAWN AEROBIC AND ANAEROBIC ANA10ML AER10ML   Culture NO GROWTH 5 DAYS  Final   Report Status 06/06/2016 FINAL  Final  MRSA PCR Screening     Status: None   Collection Time: 06/01/16  4:13 PM  Result Value Ref Range Status   MRSA by PCR NEGATIVE NEGATIVE Final    Comment:        The GeneXpert MRSA Assay (FDA approved for NASAL specimens only), is one component of a comprehensive MRSA  colonization surveillance program. It is not intended to diagnose MRSA infection nor to guide or monitor  treatment for MRSA infections.   Urine culture     Status: None   Collection Time: 06/09/16  5:00 AM  Result Value Ref Range Status   Specimen Description URINE, CATHETERIZED  Final   Special Requests NONE  Final   Culture   Final    NO GROWTH Performed at [facility], [address], [place], [place] [address]    Report Status 06/10/2016 FINAL  Final    RADIOLOGY:  Dg Chest Port 1 View  Result Date: 06/11/2016 CLINICAL DATA:  Cough. EXAM: PORTABLE CHEST 1 VIEW COMPARISON:  Radiographs of June 01, 2016. FINDINGS: The heart size and mediastinal contours are within normal limits. Both lungs are clear. Right-sided PICC line is unchanged in position. Atherosclerosis of thoracic aorta is noted. No pneumothorax or pleural effusion is noted. The visualized skeletal structures are unremarkable. IMPRESSION: No acute cardiopulmonary abnormality seen.  Aortic atherosclerosis. Electronically Signed   By: Lupita Raider, M.D.   On: 06/11/2016 07:44     Management plans discussed with the patient, family and they are in agreement.  CODE STATUS:     Code Status Orders        Start     Ordered   06/01/16 1542  Full code  Continuous     06/01/16 1541    Code Status History    Date Active Date Inactive Code Status Order ID Comments User Context   05/03/2016 10:54 PM 05/11/2016  6:08 PM Full Code [id number]  Enedina Finner, MD Inpatient   03/27/2016  7:34 PM 03/29/2016  5:47 PM Full Code [id number]  Enedina Finner, MD Inpatient   02/14/2016  6:10 PM 02/15/2016  5:03 PM Full Code [id number]  Alford Highland, MD ED   01/13/2016  9:42 AM 01/17/2016 11:47 PM Full Code [id number]  Eugenie Norrie, NP ED      TOTAL TIME TAKING CARE OF THIS PATIENT: 40 minutes.    Darrielle Pflieger M.D on 06/11/2016 at 3:10 PM  Between 7am to 6pm - Pager - [phone] After 6pm go to [email or web address] - password [email or web address]  [facility]  Office  [phone]  CC: Primary care physician; Emogene Morgan,  MD

## 2016-06-18 ENCOUNTER — Encounter (INDEPENDENT_AMBULATORY_CARE_PROVIDER_SITE_OTHER): Payer: Self-pay | Admitting: Vascular Surgery

## 2016-06-18 ENCOUNTER — Ambulatory Visit (INDEPENDENT_AMBULATORY_CARE_PROVIDER_SITE_OTHER): Payer: Commercial Managed Care - PPO

## 2016-06-18 ENCOUNTER — Ambulatory Visit (INDEPENDENT_AMBULATORY_CARE_PROVIDER_SITE_OTHER): Payer: Commercial Managed Care - PPO | Admitting: Vascular Surgery

## 2016-06-18 VITALS — BP 120/68 | HR 83 | Resp 16 | Ht 69.0 in | Wt 140.0 lb

## 2016-06-18 DIAGNOSIS — I708 Atherosclerosis of other arteries: Secondary | ICD-10-CM | POA: Diagnosis not present

## 2016-06-18 DIAGNOSIS — I700 Atherosclerosis of aorta: Secondary | ICD-10-CM | POA: Diagnosis not present

## 2016-06-18 DIAGNOSIS — I739 Peripheral vascular disease, unspecified: Secondary | ICD-10-CM

## 2016-06-18 DIAGNOSIS — Z89511 Acquired absence of right leg below knee: Secondary | ICD-10-CM

## 2016-06-18 DIAGNOSIS — E7800 Pure hypercholesterolemia, unspecified: Secondary | ICD-10-CM

## 2016-06-18 NOTE — Progress Notes (Signed)
Subjective:    Patient ID: Damon [name], male    DOB: 19-Apr-1950, 66 y.o.   MRN: [id number] Chief Complaint  Patient presents with  . Re-evaluation    Follow up of debridment of foot   Patient presents today for his first post-op follow up. He is s/p a right BKA on 06/06/16. He is without complaint. Pain is controlled with intermittent narcotic pain medication. Dressing changes provided by nursing home. Denies issues with LLE. The patient underwent an ABI which showed Right ABI: BKA and Left 0.64 (on 11/24/15, Right ABI: 0.68 and Left 0.64). Denies any fever, nausea or vomiting. Denies any LLE pain, rest pain or ulcer formation.    Review of Systems  Constitutional: Negative.   HENT: Negative.   Eyes: Negative.   Respiratory: Negative.   Cardiovascular:       S/p Right BKA  Gastrointestinal: Negative.   Endocrine: Negative.   Genitourinary: Negative.   Musculoskeletal: Negative.   Skin: Negative.   Allergic/Immunologic: Negative.   Neurological: Negative.   Hematological: Negative.   Psychiatric/Behavioral: Negative.        Objective:   Physical Exam  Constitutional: He is oriented to person, place, and time. He appears well-developed and well-nourished.  HENT:  Head: Normocephalic and atraumatic.  Eyes: Conjunctivae and EOM are normal. Pupils are equal, round, and reactive to light.  Neck: Normal range of motion.  Cardiovascular: Normal rate, regular rhythm, normal heart sounds and intact distal pulses.   Pulses:      Radial pulses are 2+ on the right side, and 2+ on the left side.       Dorsalis pedis pulses are 1+ on the left side.       Posterior tibial pulses are 1+ on the left side.  Right BKA. Staples intact. Healing well.    Pulmonary/Chest: Effort normal and breath sounds normal.  Neurological: He is alert and oriented to person, place, and time.  Psychiatric: He has a normal mood and affect. His behavior is normal. Judgment and thought content normal.   BP  120/68 (BP Location: Left Arm)   Pulse 83   Resp 16   Ht 5\' 9"  (1.753 m)   Wt 140 lb (63.5 kg)   BMI 20.67 kg/m   Past Medical History:  Diagnosis Date  . CAD (coronary artery disease)    a. 01/2016 MI/PCI: LM nl, LAD 11928m/d (3.25 x 28 Xience DES), 40d, LCX 30ost, OM1 95 (staged - 2.75 x 18 Xience Alpine DES), OM2/3 min irregs, RCA 3852m.  . Chronic combined systolic and diastolic CHF (congestive heart failure) (HCC)    a. 01/2016 Echo: Ef 25-30%, Gr1 DD, mild AI, mildly dil LA;  b. 02/2016 Echo: EF 30-35%, apical AK, antsept and ant HK, nl RV fxn.  . CKD (chronic kidney disease), stage III   . COPD (chronic obstructive pulmonary disease) (HCC)   . Diabetes mellitus without complication (HCC)   . Diverticulitis   . Essential hypertension   . Ischemic cardiomyopathy    a. 01/2016 Echo: Ef 25-30%;  b. 02/2016 Echo: EF 30-35%.  Marland Kitchen. LBBB (left bundle branch block)   . PAD (peripheral artery disease) (HCC)    a. 10/2015 s/p PTA and stenting of the RCIA, LCIA, and PTA of the LEIA (Schnier); b. 04/2016 PTA or R tibioperoneal trunk & prox PT, PTA/DBA of R Pop and SFA, Viabahn covered stent x 2 to the R SFA and pop (5mm x 25 cm & 5mm x 15cm).  .Marland Kitchen  Tobacco abuse     Social History   Social History  . Marital status: Married    Spouse name: N/A  . Number of children: N/A  . Years of education: N/A   Occupational History  . Not on file.   Social History Main Topics  . Smoking status: Current Every Day Smoker    Packs/day: 0.50    Years: 15.00    Types: Cigarettes  . Smokeless tobacco: Current User  . Alcohol use No  . Drug use: No  . Sexual activity: Not on file   Other Topics Concern  . Not on file   Social History Narrative   Lives in [place] with his wife.  Does not routinely exercise.    Past Surgical History:  Procedure Laterality Date  . AMPUTATION Right 06/06/2016   Procedure: AMPUTATION BELOW KNEE;  Surgeon: Annice Needy, MD;  Location: [facility] ORS;  Service: General;   Laterality: Right;  . CARDIAC CATHETERIZATION N/A 01/13/2016   Procedure: LEFT HEART CATH AND CORONARY ANGIOGRAPHY;  Surgeon: Iran Ouch, MD;  Location: [facility];  Service: Cardiovascular;  Laterality: N/A;  . CARDIAC CATHETERIZATION N/A 01/13/2016   Procedure: Coronary Stent Intervention;  Surgeon: Iran Ouch, MD;  Location: [facility];  Service: Cardiovascular;  Laterality: N/A;  . CARDIAC CATHETERIZATION N/A 01/16/2016   Procedure: Left Heart Cath and Coronary Angiography;  Surgeon: Iran Ouch, MD;  Location: [facility];  Service: Cardiovascular;  Laterality: N/A;  . IRRIGATION AND DEBRIDEMENT FOOT Right 05/08/2016   Procedure: IRRIGATION AND DEBRIDEMENT FOOT WITH PLACEMENT OF ANTIBIOTIC BEADS;  Surgeon: Linus Galas, DPM;  Location: [facility];  Service: Podiatry;  Laterality: Right;  . NECK SURGERY    . PERIPHERAL VASCULAR CATHETERIZATION Left 11/01/2015   Procedure: Lower Extremity Angiography;  Surgeon: Renford Dills, MD;  Location: [facility];  Service: Cardiovascular;  Laterality: Left;  . PERIPHERAL VASCULAR CATHETERIZATION Right 05/07/2016   Procedure: Lower Extremity Angiography;  Surgeon: Annice Needy, MD;  Location: [facility];  Service: Cardiovascular;  Laterality: Right;    Family History  Problem Relation Age of Onset  . Intracerebral hemorrhage Mother   . Diabetes Mother   . Cancer Mother   . Diabetes Father     No Known Allergies     Assessment & Plan:  Patient presents today for his first post-op follow up. He is s/p a right BKA on 06/06/16. He is without complaint. Pain is controlled with intermittent narcotic pain medication. Dressing changes provided by nursing home. Denies issues with LLE. The patient underwent an ABI which showed Right ABI: BKA and Left 0.64 (on 11/24/15, Right ABI: 0.68 and Left 0.64). Denies any fever, nausea or vomiting. Denies any LLE pain, rest pain or ulcer formation.   1. Hx of  BKA, right (HCC) - [place] healing well. Every other staple removed today. Will bring patient back in one week for complete staple removal. Will refer to prothesis company next visit.  2. Pure hypercholesterolemia - Stable Encouraged good control as its slows the progression of atherosclerotic disease  3. PAD (peripheral artery disease) (HCC) - Stable LLE ABI stable. Patient asymptomatic at this time Will follow every six months  - VAS [place] ABI WITH/WO TBI; Future   Current Outpatient Prescriptions on File Prior to Visit  Medication Sig Dispense Refill  . acetaminophen (TYLENOL) 500 MG tablet Take 500 mg by mouth every 6 (six) hours as needed.    Marland Kitchen  albuterol-ipratropium (COMBIVENT) 18-103 MCG/ACT inhaler Inhale into the lungs every 4 (four) hours as needed for wheezing or shortness of breath.    Marland Kitchen amoxicillin-clavulanate (AUGMENTIN) 875-125 MG tablet Take 1 tablet by mouth every 12 (twelve) hours. 6 tablet 0  . aspirin EC 81 MG tablet Take 1 tablet (81 mg total) by mouth daily. 150 tablet 2  . atorvastatin (LIPITOR) 80 MG tablet Take 1 tablet (80 mg total) by mouth daily at 6 PM. 30 tablet 2  . carvedilol (COREG) 12.5 MG tablet Take 1 tablet (12.5 mg total) by mouth 2 (two) times daily with a meal. 60 tablet 30  . clopidogrel (PLAVIX) 75 MG tablet Take 1 tablet (75 mg total) by mouth daily. 30 tablet 0  . feeding supplement, GLUCERNA SHAKE, (GLUCERNA SHAKE) LIQD Take 237 mLs by mouth 3 (three) times daily between meals. 30 Can 0  . ferrous sulfate 325 (65 FE) MG tablet Take 1 tablet (325 mg total) by mouth 3 (three) times daily with meals.  2  . gabapentin (NEURONTIN) 300 MG capsule Take 300 mg by mouth daily as needed.     . hydrALAZINE (APRESOLINE) 25 MG tablet Take 1 tablet (25 mg total) by mouth every 8 (eight) hours. 30 tablet 0  . insulin glargine (LANTUS) 100 UNIT/ML injection Inject 0.1 mLs (10 Units total) into the skin at bedtime. 10 mL 0  . lisinopril (PRINIVIL,ZESTRIL) 40  MG tablet Take 1 tablet (40 mg total) by mouth daily. 30 tablet 0  . metFORMIN (GLUCOPHAGE) 500 MG tablet Take by mouth 2 (two) times daily with a meal.    . omeprazole (PRILOSEC) 20 MG capsule Take 20 mg by mouth daily.    . ondansetron (ZOFRAN) 4 MG tablet Take 4 mg by mouth every 8 (eight) hours as needed for nausea or vomiting.    Marland Kitchen oxyCODONE-acetaminophen (PERCOCET/ROXICET) 5-325 MG tablet Take 1 tablet by mouth every 6 (six) hours as needed for severe pain. 12 tablet 0  . sennosides-docusate sodium (SENOKOT-S) 8.6-50 MG tablet Take 1 tablet by mouth daily.    . tamsulosin (FLOMAX) 0.4 MG CAPS capsule Take 1 capsule (0.4 mg total) by mouth daily. 30 capsule 0   No current facility-administered medications on file prior to visit.     There are no Patient Instructions on file for this visit. No Follow-up on file.   Denya Buckingham A Syd Manges, PA-C

## 2016-06-25 ENCOUNTER — Ambulatory Visit (INDEPENDENT_AMBULATORY_CARE_PROVIDER_SITE_OTHER): Payer: Commercial Managed Care - PPO | Admitting: Vascular Surgery

## 2016-07-04 ENCOUNTER — Ambulatory Visit (INDEPENDENT_AMBULATORY_CARE_PROVIDER_SITE_OTHER): Payer: Commercial Managed Care - PPO | Admitting: Vascular Surgery

## 2016-07-04 ENCOUNTER — Encounter (INDEPENDENT_AMBULATORY_CARE_PROVIDER_SITE_OTHER): Payer: Self-pay | Admitting: Vascular Surgery

## 2016-07-04 VITALS — BP 151/68 | HR 93 | Resp 17 | Ht 66.0 in | Wt 143.0 lb

## 2016-07-04 DIAGNOSIS — I739 Peripheral vascular disease, unspecified: Secondary | ICD-10-CM

## 2016-07-04 DIAGNOSIS — E7800 Pure hypercholesterolemia, unspecified: Secondary | ICD-10-CM

## 2016-07-04 DIAGNOSIS — Z794 Long term (current) use of insulin: Secondary | ICD-10-CM

## 2016-07-04 DIAGNOSIS — Z89511 Acquired absence of right leg below knee: Secondary | ICD-10-CM

## 2016-07-04 DIAGNOSIS — E118 Type 2 diabetes mellitus with unspecified complications: Secondary | ICD-10-CM

## 2016-07-04 NOTE — Progress Notes (Signed)
Subjective:    Patient ID: Damon [name], male    DOB: February 11, 1951, 66 y.o.   MRN: [id number] Chief Complaint  Patient presents with  . Re-evaluation    Wound check   Patient presents for a stump check. He was last seen on 06/18/16. Staples have been removed. He is without complaint. Pain is controlled. He denies any fever, nausea or vomiting.    Review of Systems  Constitutional: Negative.   HENT: Negative.   Eyes: Negative.   Respiratory: Negative.   Cardiovascular: Negative.   Gastrointestinal: Negative.   Endocrine: Negative.   Genitourinary: Negative.   Musculoskeletal: Negative.   Skin: Negative.   Allergic/Immunologic: Negative.   Neurological: Negative.   Hematological: Negative.   Psychiatric/Behavioral: Negative.       Objective:   Physical Exam  Constitutional: He is oriented to person, place, and time. He appears well-developed and well-nourished. No distress.  HENT:  Head: Atraumatic.  Eyes: Conjunctivae are normal. Pupils are equal, round, and reactive to light.  Neck: Normal range of motion.  Cardiovascular: Normal rate, regular rhythm and normal heart sounds.   Pulses:      Radial pulses are 2+ on the right side, and 2+ on the left side.       Dorsalis pedis pulses are 1+ on the left side.       Posterior tibial pulses are 1+ on the left side.  Right BKA.  Pulmonary/Chest: Effort normal.  Musculoskeletal: Normal range of motion. He exhibits no edema.  Neurological: He is alert and oriented to person, place, and time.  Skin: Skin is warm and dry. He is not diaphoretic.  BKA stump: Healed well.   Psychiatric: He has a normal mood and affect. His behavior is normal. Judgment and thought content normal.   BP (!) 151/68 (BP Location: Left Arm)   Pulse 93   Resp 17   Ht 5\' 6"  (1.676 m)   Wt 143 lb (64.9 kg)   BMI 23.08 kg/m   Past Medical History:  Diagnosis Date  . CAD (coronary artery disease)    a. 01/2016 MI/PCI: LM nl, LAD 13262m/d (3.25 x 28  Xience DES), 40d, LCX 30ost, OM1 95 (staged - 2.75 x 18 Xience Alpine DES), OM2/3 min irregs, RCA 4929m.  . Chronic combined systolic and diastolic CHF (congestive heart failure) (HCC)    a. 01/2016 Echo: Ef 25-30%, Gr1 DD, mild AI, mildly dil LA;  b. 02/2016 Echo: EF 30-35%, apical AK, antsept and ant HK, nl RV fxn.  . CKD (chronic kidney disease), stage III   . COPD (chronic obstructive pulmonary disease) (HCC)   . Diabetes mellitus without complication (HCC)   . Diverticulitis   . Essential hypertension   . Ischemic cardiomyopathy    a. 01/2016 Echo: Ef 25-30%;  b. 02/2016 Echo: EF 30-35%.  Marland Kitchen. LBBB (left bundle branch block)   . PAD (peripheral artery disease) (HCC)    a. 10/2015 s/p PTA and stenting of the RCIA, LCIA, and PTA of the LEIA (Schnier); b. 04/2016 PTA or R tibioperoneal trunk & prox PT, PTA/DBA of R Pop and SFA, Viabahn covered stent x 2 to the R SFA and pop (5mm x 25 cm & 5mm x 15cm).  . Tobacco abuse    Social History   Social History  . Marital status: Married    Spouse name: N/A  . Number of children: N/A  . Years of education: N/A   Occupational History  . Not on file.  Social History Main Topics  . Smoking status: Current Every Day Smoker    Packs/day: 0.50    Years: 15.00    Types: Cigarettes  . Smokeless tobacco: Current User  . Alcohol use No  . Drug use: No  . Sexual activity: Not on file   Other Topics Concern  . Not on file   Social History Narrative   Lives in [place] with his wife.  Does not routinely exercise.   Past Surgical History:  Procedure Laterality Date  . AMPUTATION Right 06/06/2016   Procedure: AMPUTATION BELOW KNEE;  Surgeon: Annice Needy, MD;  Location: [facility] ORS;  Service: General;  Laterality: Right;  . CARDIAC CATHETERIZATION N/A 01/13/2016   Procedure: LEFT HEART CATH AND CORONARY ANGIOGRAPHY;  Surgeon: Iran Ouch, MD;  Location: [facility] IN[facility];  Service: Cardiovascular;  Laterality: N/A;  . CARDIAC CATHETERIZATION  N/A 01/13/2016   Procedure: Coronary Stent Intervention;  Surgeon: Iran Ouch, MD;  Location: [facility];  Service: Cardiovascular;  Laterality: N/A;  . CARDIAC CATHETERIZATION N/A 01/16/2016   Procedure: Left Heart Cath and Coronary Angiography;  Surgeon: Iran Ouch, MD;  Location: [facility];  Service: Cardiovascular;  Laterality: N/A;  . IRRIGATION AND DEBRIDEMENT FOOT Right 05/08/2016   Procedure: IRRIGATION AND DEBRIDEMENT FOOT WITH PLACEMENT OF ANTIBIOTIC BEADS;  Surgeon: Linus Galas, DPM;  Location: [facility] OR[facility];  Service: Podiatry;  Laterality: Right;  . NECK SURGERY    . PERIPHERAL VASCULAR CATHETERIZATION Left 11/01/2015   Procedure: Lower Extremity Angiography;  Surgeon: Renford Dills, MD;  Location: [facility];  Service: Cardiovascular;  Laterality: Left;  . PERIPHERAL VASCULAR CATHETERIZATION Right 05/07/2016   Procedure: Lower Extremity Angiography;  Surgeon: Annice Needy, MD;  Location: [facility];  Service: Cardiovascular;  Laterality: Right;   Family History  Problem Relation Age of Onset  . Intracerebral hemorrhage Mother   . Diabetes Mother   . Cancer Mother   . Diabetes Father    No Known Allergies     Assessment & Plan:  Patient presents for a stump check. He was last seen on 06/18/16. Staples have been removed. He is without complaint. Pain is controlled. He denies any fever, nausea or vomiting.   1. PAD (peripheral artery disease) (HCC) - Stable Patient with history of BKA due to lower extremity ulcerations. Will bring patient back in one year for ABI. I have discussed with the patient at length the risk factors for and pathogenesis of atherosclerotic disease and encouraged a healthy diet, regular exercise regimen and blood pressure / glucose control.  The patient was encouraged to call the office in the interim if he experiences any claudication like symptoms, rest pain or ulcers to his feet / toes.  - VAS [place] ABI  WITH/WO TBI; Future  2. Hx of BKA, right (HCC) - Stable Patient with a well healed stump. Information sent to [employer] for shrinker stock and prothesis evaluation.  3. Pure hypercholesterolemia - Stable Encouraged good control as its slows the progression of atherosclerotic disease.  4. Type 2 diabetes mellitus with complication, with long-term current use of insulin (HCC) - Stable Encouraged good control as its slows the progression of atherosclerotic disease.  Current Outpatient Prescriptions on File Prior to Visit  Medication Sig Dispense Refill  . acetaminophen (TYLENOL) 500 MG tablet Take 500 mg by mouth every 6 (six) hours as needed.    Marland Kitchen albuterol-ipratropium (COMBIVENT) 18-103 MCG/ACT inhaler Inhale into the lungs  every 4 (four) hours as needed for wheezing or shortness of breath.    Marland Kitchen amoxicillin-clavulanate (AUGMENTIN) 875-125 MG tablet Take 1 tablet by mouth every 12 (twelve) hours. 6 tablet 0  . aspirin EC 81 MG tablet Take 1 tablet (81 mg total) by mouth daily. 150 tablet 2  . atorvastatin (LIPITOR) 80 MG tablet Take 1 tablet (80 mg total) by mouth daily at 6 PM. 30 tablet 2  . carvedilol (COREG) 12.5 MG tablet Take 1 tablet (12.5 mg total) by mouth 2 (two) times daily with a meal. 60 tablet 30  . clopidogrel (PLAVIX) 75 MG tablet Take 1 tablet (75 mg total) by mouth daily. 30 tablet 0  . feeding supplement, GLUCERNA SHAKE, (GLUCERNA SHAKE) LIQD Take 237 mLs by mouth 3 (three) times daily between meals. 30 Can 0  . ferrous sulfate 325 (65 FE) MG tablet Take 1 tablet (325 mg total) by mouth 3 (three) times daily with meals.  2  . gabapentin (NEURONTIN) 300 MG capsule Take 300 mg by mouth daily as needed.     . hydrALAZINE (APRESOLINE) 25 MG tablet Take 1 tablet (25 mg total) by mouth every 8 (eight) hours. 30 tablet 0  . insulin glargine (LANTUS) 100 UNIT/ML injection Inject 0.1 mLs (10 Units total) into the skin at bedtime. 10 mL 0  . lisinopril (PRINIVIL,ZESTRIL) 40 MG tablet  Take 1 tablet (40 mg total) by mouth daily. 30 tablet 0  . metFORMIN (GLUCOPHAGE) 500 MG tablet Take by mouth 2 (two) times daily with a meal.    . omeprazole (PRILOSEC) 20 MG capsule Take 20 mg by mouth daily.    . ondansetron (ZOFRAN) 4 MG tablet Take 4 mg by mouth every 8 (eight) hours as needed for nausea or vomiting.    Marland Kitchen oxyCODONE-acetaminophen (PERCOCET/ROXICET) 5-325 MG tablet Take 1 tablet by mouth every 6 (six) hours as needed for severe pain. 12 tablet 0  . sennosides-docusate sodium (SENOKOT-S) 8.6-50 MG tablet Take 1 tablet by mouth daily.    . tamsulosin (FLOMAX) 0.4 MG CAPS capsule Take 1 capsule (0.4 mg total) by mouth daily. 30 capsule 0   No current facility-administered medications on file prior to visit.     There are no Patient Instructions on file for this visit. No Follow-up on file.   Cristian Davitt A Olean Sangster, PA-C

## 2016-07-07 ENCOUNTER — Inpatient Hospital Stay
Admission: EM | Admit: 2016-07-07 | Discharge: 2016-07-20 | DRG: 853 | Disposition: A | Discharge status: Skilled Nursing Facility | Payer: Commercial Managed Care - PPO | Attending: Internal Medicine | Admitting: Internal Medicine

## 2016-07-07 ENCOUNTER — Emergency Department: Payer: Commercial Managed Care - PPO

## 2016-07-07 ENCOUNTER — Encounter: Payer: Self-pay | Admitting: Emergency Medicine

## 2016-07-07 DIAGNOSIS — N400 Enlarged prostate without lower urinary tract symptoms: Secondary | ICD-10-CM | POA: Diagnosis present

## 2016-07-07 DIAGNOSIS — A414 Sepsis due to anaerobes: Principal | ICD-10-CM | POA: Diagnosis present

## 2016-07-07 DIAGNOSIS — Z7982 Long term (current) use of aspirin: Secondary | ICD-10-CM | POA: Diagnosis not present

## 2016-07-07 DIAGNOSIS — E1122 Type 2 diabetes mellitus with diabetic chronic kidney disease: Secondary | ICD-10-CM | POA: Diagnosis present

## 2016-07-07 DIAGNOSIS — L89153 Pressure ulcer of sacral region, stage 3: Secondary | ICD-10-CM | POA: Diagnosis present

## 2016-07-07 DIAGNOSIS — N183 Chronic kidney disease, stage 3 (moderate): Secondary | ICD-10-CM | POA: Diagnosis present

## 2016-07-07 DIAGNOSIS — E871 Hypo-osmolality and hyponatremia: Secondary | ICD-10-CM | POA: Diagnosis present

## 2016-07-07 DIAGNOSIS — I5022 Chronic systolic (congestive) heart failure: Secondary | ICD-10-CM | POA: Diagnosis present

## 2016-07-07 DIAGNOSIS — E118 Type 2 diabetes mellitus with unspecified complications: Secondary | ICD-10-CM | POA: Diagnosis present

## 2016-07-07 DIAGNOSIS — N179 Acute kidney failure, unspecified: Secondary | ICD-10-CM | POA: Diagnosis present

## 2016-07-07 DIAGNOSIS — E11621 Type 2 diabetes mellitus with foot ulcer: Secondary | ICD-10-CM | POA: Diagnosis present

## 2016-07-07 DIAGNOSIS — Z955 Presence of coronary angioplasty implant and graft: Secondary | ICD-10-CM

## 2016-07-07 DIAGNOSIS — R0602 Shortness of breath: Secondary | ICD-10-CM

## 2016-07-07 DIAGNOSIS — I10 Essential (primary) hypertension: Secondary | ICD-10-CM | POA: Diagnosis present

## 2016-07-07 DIAGNOSIS — A419 Sepsis, unspecified organism: Secondary | ICD-10-CM | POA: Diagnosis present

## 2016-07-07 DIAGNOSIS — E1151 Type 2 diabetes mellitus with diabetic peripheral angiopathy without gangrene: Secondary | ICD-10-CM | POA: Diagnosis present

## 2016-07-07 DIAGNOSIS — Z794 Long term (current) use of insulin: Secondary | ICD-10-CM

## 2016-07-07 DIAGNOSIS — F17210 Nicotine dependence, cigarettes, uncomplicated: Secondary | ICD-10-CM | POA: Diagnosis present

## 2016-07-07 DIAGNOSIS — M60009 Infective myositis, unspecified site: Secondary | ICD-10-CM | POA: Diagnosis present

## 2016-07-07 DIAGNOSIS — I5042 Chronic combined systolic (congestive) and diastolic (congestive) heart failure: Secondary | ICD-10-CM | POA: Diagnosis present

## 2016-07-07 DIAGNOSIS — E876 Hypokalemia: Secondary | ICD-10-CM | POA: Diagnosis present

## 2016-07-07 DIAGNOSIS — J449 Chronic obstructive pulmonary disease, unspecified: Secondary | ICD-10-CM | POA: Diagnosis present

## 2016-07-07 DIAGNOSIS — Z79899 Other long term (current) drug therapy: Secondary | ICD-10-CM

## 2016-07-07 DIAGNOSIS — Z833 Family history of diabetes mellitus: Secondary | ICD-10-CM

## 2016-07-07 DIAGNOSIS — I2510 Atherosclerotic heart disease of native coronary artery without angina pectoris: Secondary | ICD-10-CM | POA: Diagnosis present

## 2016-07-07 DIAGNOSIS — A408 Other streptococcal sepsis: Secondary | ICD-10-CM | POA: Diagnosis not present

## 2016-07-07 DIAGNOSIS — E872 Acidosis: Secondary | ICD-10-CM | POA: Diagnosis present

## 2016-07-07 DIAGNOSIS — D631 Anemia in chronic kidney disease: Secondary | ICD-10-CM | POA: Diagnosis present

## 2016-07-07 DIAGNOSIS — I255 Ischemic cardiomyopathy: Secondary | ICD-10-CM | POA: Diagnosis present

## 2016-07-07 DIAGNOSIS — I130 Hypertensive heart and chronic kidney disease with heart failure and stage 1 through stage 4 chronic kidney disease, or unspecified chronic kidney disease: Secondary | ICD-10-CM | POA: Diagnosis present

## 2016-07-07 DIAGNOSIS — I471 Supraventricular tachycardia: Secondary | ICD-10-CM | POA: Diagnosis present

## 2016-07-07 DIAGNOSIS — Z7902 Long term (current) use of antithrombotics/antiplatelets: Secondary | ICD-10-CM

## 2016-07-07 DIAGNOSIS — Z89511 Acquired absence of right leg below knee: Secondary | ICD-10-CM

## 2016-07-07 DIAGNOSIS — E785 Hyperlipidemia, unspecified: Secondary | ICD-10-CM | POA: Diagnosis present

## 2016-07-07 DIAGNOSIS — I472 Ventricular tachycardia: Secondary | ICD-10-CM | POA: Diagnosis present

## 2016-07-07 DIAGNOSIS — L0231 Cutaneous abscess of buttock: Secondary | ICD-10-CM

## 2016-07-07 DIAGNOSIS — I739 Peripheral vascular disease, unspecified: Secondary | ICD-10-CM | POA: Diagnosis present

## 2016-07-07 DIAGNOSIS — M7989 Other specified soft tissue disorders: Secondary | ICD-10-CM

## 2016-07-07 DIAGNOSIS — L97529 Non-pressure chronic ulcer of other part of left foot with unspecified severity: Secondary | ICD-10-CM | POA: Diagnosis present

## 2016-07-07 DIAGNOSIS — I252 Old myocardial infarction: Secondary | ICD-10-CM

## 2016-07-07 DIAGNOSIS — E1165 Type 2 diabetes mellitus with hyperglycemia: Secondary | ICD-10-CM | POA: Diagnosis present

## 2016-07-07 DIAGNOSIS — Z6821 Body mass index (BMI) 21.0-21.9, adult: Secondary | ICD-10-CM

## 2016-07-07 DIAGNOSIS — R0689 Other abnormalities of breathing: Secondary | ICD-10-CM

## 2016-07-07 DIAGNOSIS — M726 Necrotizing fasciitis: Secondary | ICD-10-CM | POA: Diagnosis not present

## 2016-07-07 DIAGNOSIS — E11649 Type 2 diabetes mellitus with hypoglycemia without coma: Secondary | ICD-10-CM | POA: Diagnosis not present

## 2016-07-07 DIAGNOSIS — E43 Unspecified severe protein-calorie malnutrition: Secondary | ICD-10-CM | POA: Diagnosis present

## 2016-07-07 DIAGNOSIS — L89304 Pressure ulcer of unspecified buttock, stage 4: Secondary | ICD-10-CM | POA: Diagnosis not present

## 2016-07-07 LAB — COMPREHENSIVE METABOLIC PANEL
ALT: 91 U/L — AB (ref 17–63)
AST: 84 U/L — ABNORMAL HIGH (ref 15–41)
Albumin: 2.3 g/dL — ABNORMAL LOW (ref 3.5–5.0)
Alkaline Phosphatase: 87 U/L (ref 38–126)
Anion gap: 7 (ref 5–15)
BILIRUBIN TOTAL: 0.4 mg/dL (ref 0.3–1.2)
BUN: 36 mg/dL — ABNORMAL HIGH (ref 6–20)
CHLORIDE: 106 mmol/L (ref 101–111)
CO2: 22 mmol/L (ref 22–32)
CREATININE: 1.33 mg/dL — AB (ref 0.61–1.24)
Calcium: 8.4 mg/dL — ABNORMAL LOW (ref 8.9–10.3)
GFR, EST NON AFRICAN AMERICAN: 55 mL/min — AB (ref >60)
Glucose, Bld: 477 mg/dL — ABNORMAL HIGH (ref 65–99)
POTASSIUM: 3.8 mmol/L (ref 3.5–5.1)
Sodium: 135 mmol/L (ref 135–145)
TOTAL PROTEIN: 6.2 g/dL — AB (ref 6.5–8.1)

## 2016-07-07 LAB — CBC
HCT: 23.1 % — ABNORMAL LOW (ref 40.0–52.0)
HEMOGLOBIN: 7.5 g/dL — AB (ref 13.0–18.0)
MCH: 29.6 pg (ref 26.0–34.0)
MCHC: 32.6 g/dL (ref 32.0–36.0)
MCV: 90.7 fL (ref 80.0–100.0)
Platelets: 259 10*3/uL (ref 150–440)
RBC: 2.55 MIL/uL — AB (ref 4.40–5.90)
RDW: 16.6 % — ABNORMAL HIGH (ref 11.5–14.5)
WBC: 14.4 10*3/uL — ABNORMAL HIGH (ref 3.8–10.6)

## 2016-07-07 LAB — GLUCOSE, CAPILLARY: GLUCOSE-CAPILLARY: 431 mg/dL — AB (ref 65–99)

## 2016-07-07 LAB — LACTIC ACID, PLASMA: Lactic Acid, Venous: 1.4 mmol/L (ref 0.5–1.9)

## 2016-07-07 MED ORDER — VANCOMYCIN HCL IN DEXTROSE 1-5 GM/200ML-% IV SOLN
1000.0000 mg | Freq: Once | INTRAVENOUS | Status: AC
  Administered 2016-07-07: 1000 mg via INTRAVENOUS
  Filled 2016-07-07: qty 200

## 2016-07-07 MED ORDER — PIPERACILLIN-TAZOBACTAM 3.375 G IVPB 30 MIN
3.3750 g | Freq: Once | INTRAVENOUS | Status: AC
  Administered 2016-07-07: 3.375 g via INTRAVENOUS
  Filled 2016-07-07: qty 50

## 2016-07-07 MED ORDER — VANCOMYCIN HCL 10 G IV SOLR
750.0000 mg | Freq: Two times a day (BID) | INTRAVENOUS | Status: DC
  Administered 2016-07-08 (×2): 750 mg via INTRAVENOUS
  Filled 2016-07-07 (×4): qty 750

## 2016-07-07 MED ORDER — PIPERACILLIN-TAZOBACTAM 3.375 G IVPB
3.3750 g | Freq: Three times a day (TID) | INTRAVENOUS | Status: DC
  Administered 2016-07-08 – 2016-07-09 (×5): 3.375 g via INTRAVENOUS
  Filled 2016-07-07 (×9): qty 50

## 2016-07-07 NOTE — ED Provider Notes (Signed)
[facility] Emergency Department Provider Note   ____________________________________________    I have reviewed the triage vital signs and the nursing notes.   HISTORY  Chief Complaint Hyperglycemia     HPI Damon [name] is a 66 y.o. male who presents with elevated glucose, fever and feeling weak. Patient reports over the last 2 days he has felt worse and worse. He reports mild cough. Extensive peripheral vascular disease including right BKA. Denies rash or ulceration. No dysuria.   Past Medical History:  Diagnosis Date  . CAD (coronary artery disease)    a. 01/2016 MI/PCI: LM nl, LAD 167m/d (3.25 x 28 Xience DES), 40d, LCX 30ost, OM1 95 (staged - 2.75 x 18 Xience Alpine DES), OM2/3 min irregs, RCA 71m.  . Chronic combined systolic and diastolic CHF (congestive heart failure) (HCC)    a. 01/2016 Echo: Ef 25-30%, Gr1 DD, mild AI, mildly dil LA;  b. 02/2016 Echo: EF 30-35%, apical AK, antsept and ant HK, nl RV fxn.  . CKD (chronic kidney disease), stage III   . COPD (chronic obstructive pulmonary disease) (HCC)   . Diabetes mellitus without complication (HCC)   . Diverticulitis   . Essential hypertension   . Ischemic cardiomyopathy    a. 01/2016 Echo: Ef 25-30%;  b. 02/2016 Echo: EF 30-35%.  Marland Kitchen LBBB (left bundle branch block)   . PAD (peripheral artery disease) (HCC)    a. 10/2015 s/p PTA and stenting of the RCIA, LCIA, and PTA of the LEIA (Schnier); b. 04/2016 PTA or R tibioperoneal trunk & prox PT, PTA/DBA of R Pop and SFA, Viabahn covered stent x 2 to the R SFA and pop (5mm x 25 cm & 5mm x 15cm).  . Tobacco abuse     Patient Active Problem List   Diagnosis Date Noted  . Hx of BKA, right (HCC) 06/18/2016  . PAD (peripheral artery disease) (HCC) 06/18/2016  . Atypical chest pain   . Preop cardiovascular exam   . Chronic systolic heart failure (HCC)   . Sepsis (HCC) 06/01/2016  . Stable angina (HCC)   . Anemia   . Cellulitis   . Hypertensive  urgency   . Right foot ulcer (HCC) 05/03/2016  . Diabetes mellitus type 2 with complications (HCC) 05/02/2016  . 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 Protein-calorie malnutrition, severe 03/28/2016  . Acute renal failure (ARF) (HCC) 03/27/2016  . Hypotension 03/27/2016  . SOB (shortness of breath) 02/15/2016  . CKD (chronic kidney disease), stage II 02/15/2016  . Coronary artery disease of native artery of native heart with stable angina pectoris (HCC) 01/17/2016  . Essential hypertension 01/17/2016  . HLD (hyperlipidemia) 01/17/2016  . ST elevation myocardial infarction involving left anterior descending (LAD) coronary artery (HCC)   . Cardiomyopathy, ischemic   . Acute respiratory failure (HCC) 01/13/2016  . NSTEMI (non-ST elevated myocardial infarction) [facility])     Past Surgical History:  Procedure Laterality Date  . AMPUTATION Right 06/06/2016   Procedure: AMPUTATION BELOW KNEE;  Surgeon: Annice Needy, MD;  Location: [facility] ORS;  Service: General;  Laterality: Right;  . CARDIAC CATHETERIZATION N/A 01/13/2016   Procedure: LEFT HEART CATH AND CORONARY ANGIOGRAPHY;  Surgeon: Iran Ouch, MD;  Location: [facility] INVAS[facility] CV LAB;  Service: Cardiovascular;  Laterality: N/A;  . CARDIAC CATHETERIZATION N/A 01/13/2016   Procedure: Coronary Stent Intervention;  Surgeon: Iran Ouch, MD;  Location: [facility];  Service: Cardiovascular;  Laterality: N/A;  . CARDIAC CATHETERIZATION N/A 01/16/2016   Procedure: Left Heart Cath and Coronary Angiography;  Surgeon: Iran Ouch, MD;  Location: [facility];  Service: Cardiovascular;  Laterality: N/A;  . IRRIGATION AND DEBRIDEMENT FOOT Right 05/08/2016   Procedure: IRRIGATION AND DEBRIDEMENT FOOT WITH PLACEMENT OF ANTIBIOTIC BEADS;  Surgeon: Linus Galas, DPM;  Location: [facility] ORS;  Service: Podiatry;  Laterality: Right;  . NECK SURGERY    . PERIPHERAL VASCULAR  CATHETERIZATION Left 11/01/2015   Procedure: Lower Extremity Angiography;  Surgeon: Renford Dills, MD;  Location: [facility];  Service: Cardiovascular;  Laterality: Left;  . PERIPHERAL VASCULAR CATHETERIZATION Right 05/07/2016   Procedure: Lower Extremity Angiography;  Surgeon: Annice Needy, MD;  Location: [facility];  Service: Cardiovascular;  Laterality: Right;    Prior to Admission medications   Medication Sig Start Date End Date Taking? Authorizing Provider  acetaminophen (TYLENOL) 500 MG tablet Take 500 mg by mouth every 6 (six) hours as needed.    Historical Provider, MD  albuterol-ipratropium (COMBIVENT) 18-103 MCG/ACT inhaler Inhale into the lungs every 4 (four) hours as needed for wheezing or shortness of breath.    Historical Provider, MD  amoxicillin-clavulanate (AUGMENTIN) 875-125 MG tablet Take 1 tablet by mouth every 12 (twelve) hours. 06/11/16   Enedina Finner, MD  aspirin EC 81 MG tablet Take 1 tablet (81 mg total) by mouth daily. 02/07/16   Sondra Barges, PA-C  atorvastatin (LIPITOR) 80 MG tablet Take 1 tablet (80 mg total) by mouth daily at 6 PM. 01/17/16   Enid Baas, MD  carvedilol (COREG) 12.5 MG tablet Take 1 tablet (12.5 mg total) by mouth 2 (two) times daily with a meal. 06/11/16   Enedina Finner, MD  clopidogrel (PLAVIX) 75 MG tablet Take 1 tablet (75 mg total) by mouth daily. 06/12/16   Enedina Finner, MD  feeding supplement, GLUCERNA SHAKE, (GLUCERNA SHAKE) LIQD Take 237 mLs by mouth 3 (three) times daily between meals. 06/11/16   Enedina Finner, MD  ferrous sulfate 325 (65 FE) MG tablet Take 1 tablet (325 mg total) by mouth 3 (three) times daily with meals. 05/11/16   Shaune Pollack, MD  gabapentin (NEURONTIN) 300 MG capsule Take 300 mg by mouth daily as needed.     Historical Provider, MD  hydrALAZINE (APRESOLINE) 25 MG tablet Take 1 tablet (25 mg total) by mouth every 8 (eight) hours. 06/11/16   Enedina Finner, MD  insulin glargine (LANTUS) 100 UNIT/ML injection Inject 0.1 mLs  (10 Units total) into the skin at bedtime. 05/11/16   Shaune Pollack, MD  lisinopril (PRINIVIL,ZESTRIL) 40 MG tablet Take 1 tablet (40 mg total) by mouth daily. 06/12/16   Enedina Finner, MD  metFORMIN (GLUCOPHAGE) 500 MG tablet Take by mouth 2 (two) times daily with a meal.    Historical Provider, MD  omeprazole (PRILOSEC) 20 MG capsule Take 20 mg by mouth daily.    Historical Provider, MD  ondansetron (ZOFRAN) 4 MG tablet Take 4 mg by mouth every 8 (eight) hours as needed for nausea or vomiting.    Historical Provider, MD  oxyCODONE-acetaminophen (PERCOCET/ROXICET) 5-325 MG tablet Take 1 tablet by mouth every 6 (six) hours as needed for severe pain. 05/11/16   Shaune Pollack, MD  sennosides-docusate sodium (SENOKOT-S) 8.6-50 MG tablet Take 1 tablet by mouth daily.    Historical Provider, MD  tamsulosin (FLOMAX) 0.4 MG CAPS capsule Take 1 capsule (0.4 mg total) by mouth daily. 06/12/16  Enedina Finner, MD     Allergies Patient has no known allergies.  Family History  Problem Relation Age of Onset  . Intracerebral hemorrhage Mother   . Diabetes Mother   . Cancer Mother   . Diabetes Father     Social History Social History  Substance Use Topics  . Smoking status: Current Every Day Smoker    Packs/day: 0.50    Years: 15.00    Types: Cigarettes  . Smokeless tobacco: Current User  . Alcohol use No    Review of Systems  Constitutional:Positive fevers Eyes: No visual changes.   Cardiovascular: Denies chest pain. Respiratory: Mild cough Gastrointestinal: No abdominal pain.  No nausea, no vomiting.   Genitourinary: Negative for dysuria. Musculoskeletal: Negative for back pain. Skin: Negative for rash. Neurological: Negative for headaches  10-point ROS otherwise negative.  ____________________________________________   PHYSICAL EXAM:  VITAL SIGNS: ED Triage Vitals  Enc Vitals Group     BP 07/07/16 2201 (!) 160/74     Pulse Rate 07/07/16 2201 (!) 117     Resp 07/07/16 2201 (!) 32     Temp  07/07/16 2201 (!) 101 F (38.3 C)     Temp Source 07/07/16 2201 Oral     SpO2 07/07/16 2201 98 %     Weight 07/07/16 2202 134 lb 14.7 oz (61.2 kg)     Height 07/07/16 2202  (1.676 m)     Head Circumference --      Peak Flow --      Pain Score --      Pain Loc --      Pain Edu? --      Excl. in GC? --     Constitutional: Alert and oriented. No acute distress.  Eyes: Conjunctivae are normal.   Nose: No congestion/rhinnorhea. Mouth/Throat: Mucous membranes are moist.    Cardiovascular: Normal rate, regular rhythm. Grossly normal heart sounds.  Good peripheral circulation. Respiratory: Normal respiratory effort.  No retractions. Lungs CTAB. Gastrointestinal: Soft and nontender. No distention.  No CVA tenderness. Genitourinary: deferred Musculoskeletal: Right BKA, stump is well-appearing no erythema or discharge. Left forefoot is warm and 1+ dp pulse although there is some duskiness to the first second and third toes which appears to be chronic small ulceration underneath the left great toe Neurologic:  Normal speech and language. No gross focal neurologic deficits are appreciated.  Skin:  Skin is warm, dry. Psychiatric: Mood and affect are normal. Speech and behavior are normal.  ____________________________________________   LABS (all labs ordered are listed, but only abnormal results are displayed)  Labs Reviewed  CBC - Abnormal; Notable for the following:       Result Value   WBC 14.4 (*)    RBC 2.55 (*)    Hemoglobin 7.5 (*)    HCT 23.1 (*)    RDW 16.6 (*)    All other components within normal limits  COMPREHENSIVE METABOLIC PANEL - Abnormal; Notable for the following:    Glucose, Bld 477 (*)    BUN 36 (*)    Creatinine, Ser 1.33 (*)    Calcium 8.4 (*)    Total Protein 6.2 (*)    Albumin 2.3 (*)    AST 84 (*)    ALT 91 (*)    GFR calc non Af Amer 55 (*)    All other components within normal limits  GLUCOSE, CAPILLARY - Abnormal; Notable for the following:      Glucose-Capillary 431 (*)    All  other components within normal limits  CULTURE, BLOOD (ROUTINE X 2)  CULTURE, BLOOD (ROUTINE X 2)  URINE CULTURE  LACTIC ACID, PLASMA  URINALYSIS, COMPLETE (UACMP) WITH MICROSCOPIC  LACTIC ACID, PLASMA  CBG MONITORING, ED   ____________________________________________  EKG  none ____________________________________________  RADIOLOGY  Chest x-ray unremarkable ____________________________________________   PROCEDURES  Procedure(s) performed: No    Critical Care performed: No ____________________________________________   INITIAL IMPRESSION / ASSESSMENT AND PLAN / ED COURSE  Pertinent labs & imaging results that were available during my care of the patient were reviewed by me and considered in my medical decision making (see chart for details).  Patient presents with fever, tachycardia and tachypnea. Given his history of peripheral vascular disease strong concern for sepsis, code sepsis called, broad-spectrum antibiotics ordered. White blood cell count is elevated although lactic acid is reassuring. Patient with chronic low hemoglobin. Anion gap is normal. Unclear source of infection although ulceration on the left toes possibility    ____________________________________________   FINAL CLINICAL IMPRESSION(S) / ED DIAGNOSES  Final diagnoses:  Sepsis, due to unspecified organism [facility])      NEW MEDICATIONS STARTED DURING THIS VISIT:  New Prescriptions   No medications on file     Note:  This document was prepared using Dragon voice recognition software and may include unintentional dictation errors.    Jene Every, MD 07/07/16 [phone]

## 2016-07-07 NOTE — ED Notes (Signed)
Patient urged to void, pt has received fluid so far

## 2016-07-07 NOTE — H&P (Signed)
[facility] - [place] at [facility]   PATIENT NAME: Damon [name]    MR#:  [id number]  DATE OF BIRTH:  1951/02/05  DATE OF ADMISSION:  07/07/2016  PRIMARY CARE PHYSICIAN: Emogene Morgan, MD   REQUESTING/REFERRING PHYSICIAN: Cyril Loosen, MD  CHIEF COMPLAINT:   Chief Complaint  Patient presents with  . Hyperglycemia    HISTORY OF PRESENT ILLNESS:  Damon [name]  is a 66 y.o. male who presents with Fever and hyperglycemia. Patient had 103.1 fever per EMS, here in the ED was 101. Has an elevated white blood cell count, and is tachycardic. Meets sepsis criteria with an ulceration of his left foot which is a potential source of infection. Patient denies any respiratory symptoms, but is unable to contribute much to the history of present illness. Sepsis protocol followed in the ED, hospitalist called for admission.  PAST MEDICAL HISTORY:   Past Medical History:  Diagnosis Date  . CAD (coronary artery disease)    a. 01/2016 MI/PCI: LM nl, LAD 131m/d (3.25 x 28 Xience DES), 40d, LCX 30ost, OM1 95 (staged - 2.75 x 18 Xience Alpine DES), OM2/3 min irregs, RCA 40m.  . Chronic combined systolic and diastolic CHF (congestive heart failure) (HCC)    a. 01/2016 Echo: Ef 25-30%, Gr1 DD, mild AI, mildly dil LA;  b. 02/2016 Echo: EF 30-35%, apical AK, antsept and ant HK, nl RV fxn.  . CKD (chronic kidney disease), stage III   . COPD (chronic obstructive pulmonary disease) (HCC)   . Diabetes mellitus without complication (HCC)   . Diverticulitis   . Essential hypertension   . Ischemic cardiomyopathy    a. 01/2016 Echo: Ef 25-30%;  b. 02/2016 Echo: EF 30-35%.  Marland Kitchen LBBB (left bundle branch block)   . PAD (peripheral artery disease) (HCC)    a. 10/2015 s/p PTA and stenting of the RCIA, LCIA, and PTA of the LEIA (Schnier); b. 04/2016 PTA or R tibioperoneal trunk & prox PT, PTA/DBA of R Pop and SFA, Viabahn covered stent x 2 to the R SFA and pop (5mm x 25 cm & 5mm x 15cm).  . Tobacco abuse      PAST SURGICAL HISTORY:   Past Surgical History:  Procedure Laterality Date  . AMPUTATION Right 06/06/2016   Procedure: AMPUTATION BELOW KNEE;  Surgeon: Annice Needy, MD;  Location: [facility] ORS;  Service: General;  Laterality: Right;  . CARDIAC CATHETERIZATION N/A 01/13/2016   Procedure: LEFT HEART CATH AND CORONARY ANGIOGRAPHY;  Surgeon: Iran Ouch, MD;  Location: [facility] INVASIVE CV LAB;  Service: Cardiovascular;  Laterality: N/A;  . CARDIAC CATHETERIZATION N/A 01/13/2016   Procedure: Coronary Stent Intervention;  Surgeon: Iran Ouch, MD;  Location: [facility] INVASIVE CV LAB;  Service: Cardiovascular;  Laterality: N/A;  . CARDIAC CATHETERIZATION N/A 01/16/2016   Procedure: Left Heart Cath and Coronary Angiography;  Surgeon: Iran Ouch, MD;  Location: [facility] INVASIVE CV LAB;  Service: Cardiovascular;  Laterality: N/A;  . IRRIGATION AND DEBRIDEMENT FOOT Right 05/08/2016   Procedure: IRRIGATION AND DEBRIDEMENT FOOT WITH PLACEMENT OF ANTIBIOTIC BEADS;  Surgeon: Linus Galas, DPM;  Location: [facility] ORS;  Service: Podiatry;  Laterality: Right;  . NECK SURGERY    . PERIPHERAL VASCULAR CATHETERIZATION Left 11/01/2015   Procedure: Lower Extremity Angiography;  Surgeon: Renford Dills, MD;  Location: [facility] INVASIVE CV LAB;  Service: Cardiovascular;  Laterality: Left;  . PERIPHERAL VASCULAR CATHETERIZATION Right 05/07/2016   Procedure: Lower Extremity Angiography;  Surgeon: Annice Needy, MD;  Location: [facility] INVASIVE CV LAB;  Service: Cardiovascular;  Laterality: Right;    SOCIAL HISTORY:   Social History  Substance Use Topics  . Smoking status: Current Every Day Smoker    Packs/day: 0.50    Years: 15.00    Types: Cigarettes  . Smokeless tobacco: Current User  . Alcohol use No    FAMILY HISTORY:   Family History  Problem Relation Age of Onset  . Intracerebral hemorrhage Mother   . Diabetes Mother   . Cancer Mother   . Diabetes Father     DRUG ALLERGIES:  No Known  Allergies  MEDICATIONS AT HOME:   Prior to Admission medications   Medication Sig Start Date End Date Taking? Authorizing Provider  acetaminophen (TYLENOL) 500 MG tablet Take 500 mg by mouth every 6 (six) hours as needed.   Yes Historical Provider, MD  albuterol-ipratropium (COMBIVENT) 18-103 MCG/ACT inhaler Inhale into the lungs every 4 (four) hours as needed for wheezing or shortness of breath.   Yes Historical Provider, MD  atorvastatin (LIPITOR) 80 MG tablet Take 1 tablet (80 mg total) by mouth daily at 6 PM. 01/17/16  Yes Enid Baas, MD  carvedilol (COREG) 12.5 MG tablet Take 1 tablet (12.5 mg total) by mouth 2 (two) times daily with a meal. 06/11/16  Yes Enedina Finner, MD  ciprofloxacin (CIPRO) 500 MG tablet Take 500 mg by mouth 2 (two) times daily.   Yes Historical Provider, MD  clopidogrel (PLAVIX) 75 MG tablet Take 1 tablet (75 mg total) by mouth daily. 06/12/16  Yes Enedina Finner, MD  collagenase (SANTYL) ointment Apply 1 application topically daily.   Yes Historical Provider, MD  feeding supplement, GLUCERNA SHAKE, (GLUCERNA SHAKE) LIQD Take 237 mLs by mouth 3 (three) times daily between meals. 06/11/16  Yes Enedina Finner, MD  ferrous sulfate 325 (65 FE) MG tablet Take 1 tablet (325 mg total) by mouth 3 (three) times daily with meals. 05/11/16  Yes Shaune Pollack, MD  gabapentin (NEURONTIN) 300 MG capsule Take 300 mg by mouth daily as needed.    Yes Historical Provider, MD  hydrALAZINE (APRESOLINE) 25 MG tablet Take 1 tablet (25 mg total) by mouth every 8 (eight) hours. 06/11/16  Yes Enedina Finner, MD  insulin glargine (LANTUS) 100 UNIT/ML injection Inject 0.1 mLs (10 Units total) into the skin at bedtime. Patient taking differently: Inject 14 Units into the skin at bedtime.  05/11/16  Yes Shaune Pollack, MD  lisinopril (PRINIVIL,ZESTRIL) 40 MG tablet Take 1 tablet (40 mg total) by mouth daily. 06/12/16  Yes Enedina Finner, MD  metFORMIN (GLUCOPHAGE) 500 MG tablet Take by mouth 2 (two) times daily with a meal.   Yes  Historical Provider, MD  omeprazole (PRILOSEC) 20 MG capsule Take 20 mg by mouth daily.   Yes Historical Provider, MD  ondansetron (ZOFRAN) 4 MG tablet Take 4 mg by mouth every 8 (eight) hours as needed for nausea or vomiting.   Yes Historical Provider, MD  polyethylene glycol (MIRALAX / GLYCOLAX) packet Take 17 g by mouth daily.   Yes Historical Provider, MD  sennosides-docusate sodium (SENOKOT-S) 8.6-50 MG tablet Take 1 tablet by mouth daily.   Yes Historical Provider, MD  tamsulosin (FLOMAX) 0.4 MG CAPS capsule Take 1 capsule (0.4 mg total) by mouth daily. 06/12/16  Yes Enedina Finner, MD  traMADol (ULTRAM) 50 MG tablet Take by mouth every 6 (six) hours as needed.   Yes Historical Provider, MD  amoxicillin-clavulanate (AUGMENTIN) 875-125 MG tablet Take 1 tablet by mouth every  12 (twelve) hours. Patient not taking: Reported on 07/07/2016 06/11/16   Enedina Finner, MD  aspirin EC 81 MG tablet Take 1 tablet (81 mg total) by mouth daily. Patient not taking: Reported on 07/07/2016 02/07/16   Sondra Barges, PA-C  oxyCODONE-acetaminophen (PERCOCET/ROXICET) 5-325 MG tablet Take 1 tablet by mouth every 6 (six) hours as needed for severe pain. Patient not taking: Reported on 07/07/2016 05/11/16   Shaune Pollack, MD    REVIEW OF SYSTEMS:  Review of Systems  Unable to perform ROS: Acuity of condition     VITAL SIGNS:   Vitals:   07/07/16 2201 07/07/16 2202  BP: (!) 160/74   Pulse: (!) 117   Resp: (!) 32   Temp: (!) 101 F (38.3 C)   TempSrc: Oral   SpO2: 98%   Weight:  61.2 kg (134 lb 14.7 oz)  Height:   (1.676 m)   Wt Readings from Last 3 Encounters:  07/07/16 61.2 kg (134 lb 14.7 oz)  07/04/16 64.9 kg (143 lb)  06/18/16 63.5 kg (140 lb)    PHYSICAL EXAMINATION:  Physical Exam  Vitals reviewed. Constitutional: He is oriented to person, place, and time. He appears well-developed and well-nourished. No distress.  HENT:  Head: Normocephalic and atraumatic.  Mouth/Throat: Oropharynx is clear and  moist.  Eyes: Conjunctivae and EOM are normal. Pupils are equal, round, and reactive to light. No scleral icterus.  Neck: Normal range of motion. Neck supple. No JVD present. No thyromegaly present.  Cardiovascular: Normal rate, regular rhythm and intact distal pulses.  Exam reveals no gallop and no friction rub.   No murmur heard. Respiratory: Effort normal and breath sounds normal. No respiratory distress. He has no wheezes. He has no rales.  GI: Soft. Bowel sounds are normal. He exhibits no distension. There is no tenderness.  Musculoskeletal: Normal range of motion. He exhibits deformity (Right BKA). He exhibits no edema.  No arthritis, no gout  Lymphadenopathy:    He has no cervical adenopathy.  Neurological: He is alert and oriented to person, place, and time. No cranial nerve deficit.  No dysarthria, no aphasia  Skin: Skin is warm and dry. No rash noted. No erythema.  Ulceration of left foot on the ball of the foot. No purulence or drainage.  Psychiatric: He has a normal mood and affect. His behavior is normal. Judgment and thought content normal.    LABORATORY PANEL:   CBC  Recent Labs Lab 07/07/16 2207  WBC 14.4*  HGB 7.5*  HCT 23.1*  PLT 259   ------------------------------------------------------------------------------------------------------------------  Chemistries   Recent Labs Lab 07/07/16 2207  NA 135  K 3.8  CL 106  CO2 22  GLUCOSE 477*  BUN 36*  CREATININE 1.33*  CALCIUM 8.4*  AST 84*  ALT 91*  ALKPHOS 87  BILITOT 0.4   ------------------------------------------------------------------------------------------------------------------  Cardiac Enzymes No results for input(s): TROPONINI in the last 168 hours. ------------------------------------------------------------------------------------------------------------------  RADIOLOGY:  Dg Chest Port 1 View  Result Date: 07/07/2016 CLINICAL DATA:  Hyperglycemia EXAM: PORTABLE CHEST 1 VIEW  COMPARISON:  06/11/2016 chest radiograph. FINDINGS: Surgical hardware overlies the lower cervical spine. Stable cardiomediastinal silhouette with normal heart size and aortic atherosclerosis. No pneumothorax. No pleural effusion. Lungs appear clear, with no acute consolidative airspace disease and no pulmonary edema. IMPRESSION: No active disease. Aortic atherosclerosis. Electronically Signed   By: Delbert Phenix M.D.   On: 07/07/2016 22:52    EKG:   Orders placed or performed during the hospital encounter of 06/01/16  .  ED EKG within 10 minutes  . ED EKG within 10 minutes  . EKG 12-Lead  . EKG 12-Lead    IMPRESSION AND PLAN:  Principal Problem:   Sepsis (HCC) - IV antibiotics, lactic acid was within normal limits, patient is hemodynamically stable, cultures sent from the ED. Active Problems:   Diabetes mellitus type 2 with complications (HCC) - sliding scale insulin with corresponding glucose checks   PAD (peripheral artery disease) (HCC) - continue home meds including dual antiplatelet therapy   Ulcer of left foot (HCC) - possibly source of his infection, urine studies also still pending, podiatry consult for further recommendations for his foot ulcer   Coronary artery disease - continue home meds   Essential hypertension - continue home medications   Chronic systolic heart failure (HCC) - continue home meds   HLD (hyperlipidemia) - home dose statin  All the records are reviewed and case discussed with ED provider. Management plans discussed with the patient and/or family.  DVT PROPHYLAXIS: SubQ lovenox  GI PROPHYLAXIS: None  ADMISSION STATUS: Inpatient  CODE STATUS: Full Code Status History    Date Active Date Inactive Code Status Order ID Comments User Context   06/01/2016  3:41 PM 06/11/2016  9:14 PM Full Code [id number]  Adrian Saran, MD ED   05/03/2016 10:54 PM 05/11/2016  6:08 PM Full Code [id number]  Enedina Finner, MD Inpatient   03/27/2016  7:34 PM 03/29/2016  5:47 PM Full Code  [id number]  Enedina Finner, MD Inpatient   02/14/2016  6:10 PM 02/15/2016  5:03 PM Full Code [id number]  Alford Highland, MD ED   01/13/2016  9:42 AM 01/17/2016 11:47 PM Full Code [id number]  Eugenie Norrie, NP ED    Advance Directive Documentation     Most Recent Value  Type of Advance Directive  Healthcare Power of Attorney  Pre-existing out of facility DNR order (yellow form or pink MOST form)  -  "MOST" Form in Place?  -      TOTAL TIME TAKING CARE OF THIS PATIENT: 45 minutes.    Loura Pitt FIELDING 07/07/2016, 11:25 PM  [facility]  Office  [phone]  CC: Primary care physician; Emogene Morgan, MD

## 2016-07-07 NOTE — ED Notes (Signed)
XR at bedside

## 2016-07-07 NOTE — Progress Notes (Signed)
Pharmacy Antibiotic Note  Damon [name] is a 66 y.o. male admitted on 07/07/2016 with pneumonia.  Pharmacy has been consulted for Vanc/zosyn dosing.  Plan: Will give patient vanc 1g IV x 1 in the ED  Will follow w/ vanc 750 mg q12h  to start 4/1 @ 1030. Will get a vanc trough 4/2 @ 0930 prior to 4th dose. Ke 0.0522 T1/2 13 ~ 12 hours Zosyn 3.375g IV q8h extended infusion.  Height:  (167.6 cm) Weight: 134 lb 14.7 oz (61.2 kg) IBW/kg (Calculated) : 63.8  Temp (24hrs), Avg:101 F (38.3 C), Min:101 F (38.3 C), Max:101 F (38.3 C)   Recent Labs Lab 07/07/16 2207  WBC 14.4*    CrCl cannot be calculated (Patient's most recent lab result is older than the maximum 21 days allowed.).    No Known Allergies   Thank you for allowing pharmacy to be a part of this patient's care.  Thomasene Ripple, PharmD, BCPS Clinical Pharmacist 07/07/2016

## 2016-07-08 ENCOUNTER — Inpatient Hospital Stay: Payer: Commercial Managed Care - PPO

## 2016-07-08 LAB — BLOOD CULTURE ID PANEL (REFLEXED)
Acinetobacter baumannii: NOT DETECTED
CANDIDA ALBICANS: NOT DETECTED
CANDIDA KRUSEI: NOT DETECTED
CANDIDA PARAPSILOSIS: NOT DETECTED
CANDIDA TROPICALIS: NOT DETECTED
Candida glabrata: NOT DETECTED
ESCHERICHIA COLI: NOT DETECTED
Enterobacter cloacae complex: NOT DETECTED
Enterobacteriaceae species: NOT DETECTED
Enterococcus species: NOT DETECTED
HAEMOPHILUS INFLUENZAE: NOT DETECTED
KLEBSIELLA OXYTOCA: NOT DETECTED
KLEBSIELLA PNEUMONIAE: NOT DETECTED
Listeria monocytogenes: NOT DETECTED
Neisseria meningitidis: NOT DETECTED
Proteus species: NOT DETECTED
Pseudomonas aeruginosa: NOT DETECTED
SERRATIA MARCESCENS: NOT DETECTED
STREPTOCOCCUS PYOGENES: NOT DETECTED
STREPTOCOCCUS SPECIES: NOT DETECTED
Staphylococcus aureus (BCID): NOT DETECTED
Staphylococcus species: NOT DETECTED
Streptococcus agalactiae: NOT DETECTED
Streptococcus pneumoniae: NOT DETECTED

## 2016-07-08 LAB — BASIC METABOLIC PANEL
ANION GAP: 7 (ref 5–15)
BUN: 31 mg/dL — AB (ref 6–20)
CALCIUM: 8.2 mg/dL — AB (ref 8.9–10.3)
CO2: 21 mmol/L — AB (ref 22–32)
Chloride: 110 mmol/L (ref 101–111)
Creatinine, Ser: 1.2 mg/dL (ref 0.61–1.24)
GFR calc Af Amer: >60 mL/min (ref >60)
GLUCOSE: 334 mg/dL — AB (ref 65–99)
Potassium: 3.6 mmol/L (ref 3.5–5.1)
Sodium: 138 mmol/L (ref 135–145)

## 2016-07-08 LAB — GLUCOSE, CAPILLARY
GLUCOSE-CAPILLARY: 370 mg/dL — AB (ref 65–99)
GLUCOSE-CAPILLARY: 232 mg/dL — AB (ref 65–99)
GLUCOSE-CAPILLARY: 207 mg/dL — AB (ref 65–99)
Glucose-Capillary: 167 mg/dL — ABNORMAL HIGH (ref 65–99)

## 2016-07-08 LAB — HEMOGLOBIN AND HEMATOCRIT, BLOOD
HCT: 24.6 % — ABNORMAL LOW (ref 40.0–52.0)
Hemoglobin: 8.2 g/dL — ABNORMAL LOW (ref 13.0–18.0)

## 2016-07-08 LAB — CBC
HEMATOCRIT: 21.2 % — AB (ref 40.0–52.0)
HEMOGLOBIN: 7 g/dL — AB (ref 13.0–18.0)
MCH: 30.2 pg (ref 26.0–34.0)
MCHC: 33 g/dL (ref 32.0–36.0)
MCV: 91.3 fL (ref 80.0–100.0)
Platelets: 237 10*3/uL (ref 150–440)
RBC: 2.32 MIL/uL — ABNORMAL LOW (ref 4.40–5.90)
RDW: 16.5 % — AB (ref 11.5–14.5)
WBC: 15.3 10*3/uL — ABNORMAL HIGH (ref 3.8–10.6)

## 2016-07-08 LAB — PREPARE RBC (CROSSMATCH)

## 2016-07-08 MED ORDER — ONDANSETRON HCL 4 MG/2ML IJ SOLN
4.0000 mg | Freq: Four times a day (QID) | INTRAMUSCULAR | Status: DC | PRN
  Administered 2016-07-14 – 2016-07-19 (×2): 4 mg via INTRAVENOUS
  Filled 2016-07-08 (×3): qty 2

## 2016-07-08 MED ORDER — ACETAMINOPHEN 325 MG PO TABS
650.0000 mg | ORAL_TABLET | Freq: Four times a day (QID) | ORAL | Status: DC | PRN
  Administered 2016-07-08 – 2016-07-13 (×9): 650 mg via ORAL
  Filled 2016-07-08 (×9): qty 2

## 2016-07-08 MED ORDER — CLOPIDOGREL BISULFATE 75 MG PO TABS
75.0000 mg | ORAL_TABLET | Freq: Every day | ORAL | Status: DC
  Administered 2016-07-08 – 2016-07-12 (×5): 75 mg via ORAL
  Filled 2016-07-08 (×5): qty 1

## 2016-07-08 MED ORDER — SODIUM CHLORIDE 0.9 % IV SOLN
Freq: Once | INTRAVENOUS | Status: AC
  Administered 2016-07-08: 15:00:00 via INTRAVENOUS

## 2016-07-08 MED ORDER — PANTOPRAZOLE SODIUM 40 MG PO TBEC
40.0000 mg | DELAYED_RELEASE_TABLET | Freq: Every day | ORAL | Status: DC
  Administered 2016-07-08 – 2016-07-20 (×13): 40 mg via ORAL
  Filled 2016-07-08 (×13): qty 1

## 2016-07-08 MED ORDER — ONDANSETRON HCL 4 MG PO TABS: 4.0000 mg | ORAL_TABLET | Freq: Four times a day (QID) | ORAL | Status: DC | PRN

## 2016-07-08 MED ORDER — ATORVASTATIN CALCIUM 20 MG PO TABS
80.0000 mg | ORAL_TABLET | Freq: Every day | ORAL | Status: DC
  Administered 2016-07-08 – 2016-07-19 (×12): 80 mg via ORAL
  Filled 2016-07-08 (×12): qty 4

## 2016-07-08 MED ORDER — HYDRALAZINE HCL 25 MG PO TABS
25.0000 mg | ORAL_TABLET | Freq: Three times a day (TID) | ORAL | Status: DC
  Administered 2016-07-08 – 2016-07-10 (×7): 25 mg via ORAL
  Filled 2016-07-08 (×8): qty 1

## 2016-07-08 MED ORDER — INSULIN ASPART 100 UNIT/ML ~~LOC~~ SOLN
0.0000 [IU] | Freq: Every day | SUBCUTANEOUS | Status: DC
  Administered 2016-07-08: 5 [IU] via SUBCUTANEOUS
  Filled 2016-07-08: qty 5

## 2016-07-08 MED ORDER — ACETAMINOPHEN 650 MG RE SUPP: 650.0000 mg | Freq: Four times a day (QID) | RECTAL | Status: DC | PRN

## 2016-07-08 MED ORDER — INSULIN REGULAR HUMAN 100 UNIT/ML IJ SOLN
5.0000 [IU] | Freq: Once | INTRAMUSCULAR | Status: DC
  Filled 2016-07-08: qty 0.05

## 2016-07-08 MED ORDER — TAMSULOSIN HCL 0.4 MG PO CAPS
0.4000 mg | ORAL_CAPSULE | Freq: Every day | ORAL | Status: DC
  Administered 2016-07-08 – 2016-07-20 (×13): 0.4 mg via ORAL
  Filled 2016-07-08 (×13): qty 1

## 2016-07-08 MED ORDER — ENOXAPARIN SODIUM 40 MG/0.4ML ~~LOC~~ SOLN
40.0000 mg | SUBCUTANEOUS | Status: DC
  Administered 2016-07-08 – 2016-07-12 (×5): 40 mg via SUBCUTANEOUS
  Filled 2016-07-08 (×5): qty 0.4

## 2016-07-08 MED ORDER — FUROSEMIDE 10 MG/ML IJ SOLN
20.0000 mg | Freq: Once | INTRAMUSCULAR | Status: AC
  Administered 2016-07-08: 20 mg via INTRAVENOUS
  Filled 2016-07-08: qty 2

## 2016-07-08 MED ORDER — INSULIN ASPART 100 UNIT/ML ~~LOC~~ SOLN: 0.0000 [IU] | Freq: Three times a day (TID) | SUBCUTANEOUS | Status: DC

## 2016-07-08 MED ORDER — CARVEDILOL 12.5 MG PO TABS
12.5000 mg | ORAL_TABLET | Freq: Two times a day (BID) | ORAL | Status: DC
  Administered 2016-07-08 – 2016-07-11 (×7): 12.5 mg via ORAL
  Filled 2016-07-08 (×7): qty 1

## 2016-07-08 MED ORDER — LISINOPRIL 20 MG PO TABS
40.0000 mg | ORAL_TABLET | Freq: Every day | ORAL | Status: DC
  Administered 2016-07-08 – 2016-07-11 (×3): 40 mg via ORAL
  Filled 2016-07-08 (×4): qty 2

## 2016-07-08 MED ORDER — INSULIN ASPART 100 UNIT/ML ~~LOC~~ SOLN
0.0000 [IU] | Freq: Three times a day (TID) | SUBCUTANEOUS | Status: DC
  Administered 2016-07-08: 2 [IU] via SUBCUTANEOUS
  Administered 2016-07-08: 3 [IU] via SUBCUTANEOUS
  Administered 2016-07-08: 2 [IU] via SUBCUTANEOUS
  Administered 2016-07-08 – 2016-07-09 (×3): 3 [IU] via SUBCUTANEOUS
  Administered 2016-07-09: 2 [IU] via SUBCUTANEOUS
  Administered 2016-07-09: 3 [IU] via SUBCUTANEOUS
  Administered 2016-07-10: 5 [IU] via SUBCUTANEOUS
  Administered 2016-07-10 – 2016-07-11 (×4): 3 [IU] via SUBCUTANEOUS
  Filled 2016-07-08: qty 3
  Filled 2016-07-08: qty 5
  Filled 2016-07-08 (×2): qty 3
  Filled 2016-07-08: qty 1
  Filled 2016-07-08: qty 3
  Filled 2016-07-08: qty 11
  Filled 2016-07-08 (×2): qty 3
  Filled 2016-07-08: qty 5
  Filled 2016-07-08: qty 2
  Filled 2016-07-08: qty 4
  Filled 2016-07-08: qty 3
  Filled 2016-07-08: qty 2
  Filled 2016-07-08: qty 3
  Filled 2016-07-08: qty 2

## 2016-07-08 NOTE — Progress Notes (Signed)
[facility] - [place] at [facility]   PATIENT NAME: Damon [name]    MR#:  [id number]  DATE OF BIRTH:  [birth year]  SUBJECTIVE:  CHIEF COMPLAINT:   Chief Complaint  Patient presents with  . Hyperglycemia   No change. Poor historian. Does not complain of any pain or shortness of breath. Continues to be febrile.  REVIEW OF SYSTEMS:    Review of Systems  Constitutional: Positive for fever and malaise/fatigue. Negative for chills.  HENT: Negative for sore throat.   Eyes: Negative for blurred vision, double vision and pain.  Respiratory: Negative for cough, hemoptysis, shortness of breath and wheezing.   Cardiovascular: Negative for chest pain, palpitations, orthopnea and leg swelling.  Gastrointestinal: Negative for abdominal pain, constipation, diarrhea, heartburn, nausea and vomiting.  Genitourinary: Negative for dysuria and hematuria.  Musculoskeletal: Negative for back pain and joint pain.  Skin: Negative for rash.  Neurological: Negative for sensory change, speech change, focal weakness and headaches.  Endo/Heme/Allergies: Does not bruise/bleed easily.  Psychiatric/Behavioral: Negative for depression. The patient is not nervous/anxious.     DRUG ALLERGIES:  No Known Allergies  VITALS:  Blood pressure 136/60, pulse (!) 104, temperature (!) 100.5 F (38.1 C), temperature source Oral, resp. rate 20, height  (1.676 m), weight 61.2 kg (134 lb 14.7 oz), SpO2 100 %.  PHYSICAL EXAMINATION:   Physical Exam  GENERAL:  66 y.o.-year-old patient lying in the bed with no acute distress.  EYES: Pupils equal, round, reactive to light and accommodation. No scleral icterus. Extraocular muscles intact.  HEENT: Head atraumatic, normocephalic. Oropharynx and nasopharynx clear.  NECK:  Supple, no jugular venous distention. No thyroid enlargement, no tenderness.  LUNGS: Normal breath sounds bilaterally, no wheezing, rales, rhonchi. No use of accessory muscles of respiration.   CARDIOVASCULAR: S1, S2 normal. No murmurs, rubs, or gallops.  ABDOMEN: Soft, nontender, nondistended. Bowel sounds present. No organomegaly or mass.  EXTREMITIES: Crusting and ulcers on the left foot. Right below-knee amputation. NEUROLOGIC: Cranial nerves II through XII are intact. No focal Motor or sensory deficits b/l.   PSYCHIATRIC: The patient is alert and awake SKIN: No obvious rash, lesion, or ulcer.  Sacral decubitus ulcer with clean granulation tissue  LABORATORY PANEL:   CBC  Recent Labs Lab 07/08/16 0340  WBC 15.3*  HGB 7.0*  HCT 21.2*  PLT 237   ------------------------------------------------------------------------------------------------------------------ Chemistries   Recent Labs Lab 07/07/16 2207 07/08/16 0340  NA 135 138  K 3.8 3.6  CL 106 110  CO2 22 21*  GLUCOSE 477* 334*  BUN 36* 31*  CREATININE 1.33* 1.20  CALCIUM 8.4* 8.2*  AST 84*  --   ALT 91*  --   ALKPHOS 87  --   BILITOT 0.4  --    ------------------------------------------------------------------------------------------------------------------  Cardiac Enzymes No results for input(s): TROPONINI in the last 168 hours. ------------------------------------------------------------------------------------------------------------------  RADIOLOGY:  Dg Chest Port 1 View  Result Date: 07/07/2016 CLINICAL DATA:  Hyperglycemia EXAM: PORTABLE CHEST 1 VIEW COMPARISON:  06/11/2016 chest radiograph. FINDINGS: Surgical hardware overlies the lower cervical spine. Stable cardiomediastinal silhouette with normal heart size and aortic atherosclerosis. No pneumothorax. No pleural effusion. Lungs appear clear, with no acute consolidative airspace disease and no pulmonary edema. IMPRESSION: No active disease. Aortic atherosclerosis. Electronically Signed   By: Delbert Phenix M.D.   On: 07/07/2016 22:52     ASSESSMENT AND PLAN:   * Sepsis. Etiology unclear. He does have some decubitus ulcers which could  cause bacteremia. Ulcers don't look infected. On  broad-spectrum antibiotics. Cultures pending. Wound care consult.  * Worsening anemia of chronic disease. Transfuse 1 unit packed RBC.  * Chronic systolic CHF with ejection fraction of 25-30%. Stable. No signs of fluid overload.  * Diabetes mellitus. Sliding scale insulin.  * DVT prophylaxis with Lovenox.  All the records are reviewed and case discussed with Care Management/Social Workerr. Management plans discussed with the patient, family and they are in agreement.  CODE STATUS: FULL CODE  DVT Prophylaxis: SCDs  TOTAL TIME TAKING CARE OF THIS PATIENT: 35 minutes.   POSSIBLE D/C IN 2-3 DAYS, DEPENDING ON CLINICAL CONDITION.  Milagros Loll R M.D on 07/08/2016 at 11:28 AM  Between 7am to 6pm - Pager - [phone]  After 6pm go to [email or web address] - password [email or web address] [facility]  [facility]  Office  [phone]  CC: Primary care physician; Emogene Morgan, MD  Note: This dictation was prepared with Dragon dictation along with smaller phrase technology. Any transcriptional errors that result from this process are unintentional.

## 2016-07-08 NOTE — Consult Note (Signed)
ORTHOPAEDIC CONSULTATION  REQUESTING PHYSICIAN: Milagros Loll, MD  Chief Complaint: Sepsis  HPI: Damon [name] is a 66 y.o. male who complains of  possible infection. Consulted by Dr. Elpidio Anis.  Admitted with possible Sepsis with elevated white cell count.   Patient is status post right BKA. Has ulcers on his left foot for evaluation.  Past Medical History:  Diagnosis Date  . CAD (coronary artery disease)    a. 01/2016 MI/PCI: LM nl, LAD 171m/d (3.25 x 28 Xience DES), 40d, LCX 30ost, OM1 95 (staged - 2.75 x 18 Xience Alpine DES), OM2/3 min irregs, RCA 73m.  . Chronic combined systolic and diastolic CHF (congestive heart failure) (HCC)    a. 01/2016 Echo: Ef 25-30%, Gr1 DD, mild AI, mildly dil LA;  b. 02/2016 Echo: EF 30-35%, apical AK, antsept and ant HK, nl RV fxn.  . CKD (chronic kidney disease), stage III   . COPD (chronic obstructive pulmonary disease) (HCC)   . Diabetes mellitus without complication (HCC)   . Diverticulitis   . Essential hypertension   . Ischemic cardiomyopathy    a. 01/2016 Echo: Ef 25-30%;  b. 02/2016 Echo: EF 30-35%.  Marland Kitchen LBBB (left bundle branch block)   . PAD (peripheral artery disease) (HCC)    a. 10/2015 s/p PTA and stenting of the RCIA, LCIA, and PTA of the LEIA (Schnier); b. 04/2016 PTA or R tibioperoneal trunk & prox PT, PTA/DBA of R Pop and SFA, Viabahn covered stent x 2 to the R SFA and pop (5mm x 25 cm & 5mm x 15cm).  . Tobacco abuse    Past Surgical History:  Procedure Laterality Date  . AMPUTATION Right 06/06/2016   Procedure: AMPUTATION BELOW KNEE;  Surgeon: Annice Needy, MD;  Location: [facility] ORS;  Service: General;  Laterality: Right;  . CARDIAC CATHETERIZATION N/A 01/13/2016   Procedure: LEFT HEART CATH AND CORONARY ANGIOGRAPHY;  Surgeon: Iran Ouch, MD;  Location: [facility] INVASIVE CV [facility];  Service: Cardiovascular;  Laterality: N/A;  . CARDIAC CATHETERIZATION N/A 01/13/2016   Procedure: Coronary Stent Intervention;  Surgeon: Iran Ouch, MD;   Location: [facility] INVASIVE CV [facility];  Service: Cardiovascular;  Laterality: N/A;  . CARDIAC CATHETERIZATION N/A 01/16/2016   Procedure: Left Heart Cath and Coronary Angiography;  Surgeon: Iran Ouch, MD;  Location: [facility] CV [facility];  Service: Cardiovascular;  Laterality: N/A;  . IRRIGATION AND DEBRIDEMENT FOOT Right 05/08/2016   Procedure: IRRIGATION AND DEBRIDEMENT FOOT WITH PLACEMENT OF ANTIBIOTIC BEADS;  Surgeon: Linus Galas, DPM;  Location: [facility] ORS;  Service: Podiatry;  Laterality: Right;  . NECK SURGERY    . PERIPHERAL VASCULAR CATHETERIZATION Left 11/01/2015   Procedure: Lower Extremity Angiography;  Surgeon: Renford Dills, MD;  Location: [facility];  Service: Cardiovascular;  Laterality: Left;  . PERIPHERAL VASCULAR CATHETERIZATION Right 05/07/2016   Procedure: Lower Extremity Angiography;  Surgeon: Annice Needy, MD;  Location: [facility];  Service: Cardiovascular;  Laterality: Right;   Social History   Social History  . Marital status: Married    Spouse name: N/A  . Number of children: N/A  . Years of education: N/A   Social History Main Topics  . Smoking status: Current Every Day Smoker    Packs/day: 0.50    Years: 15.00    Types: Cigarettes  . Smokeless tobacco: Current User  . Alcohol use No  . Drug use: No  . Sexual activity: Not Asked   Other Topics Concern  . None  Social History Narrative   Lives in [place] with his wife.  Does not routinely exercise.   Family History  Problem Relation Age of Onset  . Intracerebral hemorrhage Mother   . Diabetes Mother   . Cancer Mother   . Diabetes Father    No Known Allergies Prior to Admission medications   Medication Sig Start Date End Date Taking? Authorizing Provider  acetaminophen (TYLENOL) 500 MG tablet Take 500 mg by mouth every 6 (six) hours as needed.   Yes Historical Provider, MD  albuterol-ipratropium (COMBIVENT) 18-103 MCG/ACT inhaler Inhale into the lungs every 4 (four) hours as  needed for wheezing or shortness of breath.   Yes Historical Provider, MD  atorvastatin (LIPITOR) 80 MG tablet Take 1 tablet (80 mg total) by mouth daily at 6 PM. 01/17/16  Yes Enid Baas, MD  carvedilol (COREG) 12.5 MG tablet Take 1 tablet (12.5 mg total) by mouth 2 (two) times daily with a meal. 06/11/16  Yes Enedina Finner, MD  ciprofloxacin (CIPRO) 500 MG tablet Take 500 mg by mouth 2 (two) times daily.   Yes Historical Provider, MD  clopidogrel (PLAVIX) 75 MG tablet Take 1 tablet (75 mg total) by mouth daily. 06/12/16  Yes Enedina Finner, MD  collagenase (SANTYL) ointment Apply 1 application topically daily.   Yes Historical Provider, MD  feeding supplement, GLUCERNA SHAKE, (GLUCERNA SHAKE) LIQD Take 237 mLs by mouth 3 (three) times daily between meals. 06/11/16  Yes Enedina Finner, MD  ferrous sulfate 325 (65 FE) MG tablet Take 1 tablet (325 mg total) by mouth 3 (three) times daily with meals. 05/11/16  Yes Shaune Pollack, MD  gabapentin (NEURONTIN) 300 MG capsule Take 300 mg by mouth daily as needed.    Yes Historical Provider, MD  hydrALAZINE (APRESOLINE) 25 MG tablet Take 1 tablet (25 mg total) by mouth every 8 (eight) hours. 06/11/16  Yes Enedina Finner, MD  insulin glargine (LANTUS) 100 UNIT/ML injection Inject 0.1 mLs (10 Units total) into the skin at bedtime. Patient taking differently: Inject 14 Units into the skin at bedtime.  05/11/16  Yes Shaune Pollack, MD  lisinopril (PRINIVIL,ZESTRIL) 40 MG tablet Take 1 tablet (40 mg total) by mouth daily. 06/12/16  Yes Enedina Finner, MD  metFORMIN (GLUCOPHAGE) 500 MG tablet Take by mouth 2 (two) times daily with a meal.   Yes Historical Provider, MD  omeprazole (PRILOSEC) 20 MG capsule Take 20 mg by mouth daily.   Yes Historical Provider, MD  ondansetron (ZOFRAN) 4 MG tablet Take 4 mg by mouth every 8 (eight) hours as needed for nausea or vomiting.   Yes Historical Provider, MD  polyethylene glycol (MIRALAX / GLYCOLAX) packet Take 17 g by mouth daily.   Yes Historical Provider, MD   sennosides-docusate sodium (SENOKOT-S) 8.6-50 MG tablet Take 1 tablet by mouth daily.   Yes Historical Provider, MD  tamsulosin (FLOMAX) 0.4 MG CAPS capsule Take 1 capsule (0.4 mg total) by mouth daily. 06/12/16  Yes Enedina Finner, MD  traMADol (ULTRAM) 50 MG tablet Take by mouth every 6 (six) hours as needed.   Yes Historical Provider, MD  amoxicillin-clavulanate (AUGMENTIN) 875-125 MG tablet Take 1 tablet by mouth every 12 (twelve) hours. Patient not taking: Reported on 07/07/2016 06/11/16   Enedina Finner, MD  aspirin EC 81 MG tablet Take 1 tablet (81 mg total) by mouth daily. Patient not taking: Reported on 07/07/2016 02/07/16   Sondra Barges, PA-C  oxyCODONE-acetaminophen (PERCOCET/ROXICET) 5-325 MG tablet Take 1 tablet by mouth every 6 (six)  hours as needed for severe pain. Patient not taking: Reported on 07/07/2016 05/11/16   Shaune Pollack, MD   Dg Chest Port 1 View  Result Date: 07/07/2016 CLINICAL DATA:  Hyperglycemia EXAM: PORTABLE CHEST 1 VIEW COMPARISON:  06/11/2016 chest radiograph. FINDINGS: Surgical hardware overlies the lower cervical spine. Stable cardiomediastinal silhouette with normal heart size and aortic atherosclerosis. No pneumothorax. No pleural effusion. Lungs appear clear, with no acute consolidative airspace disease and no pulmonary edema. IMPRESSION: No active disease. Aortic atherosclerosis. Electronically Signed   By: Delbert Phenix M.D.   On: 07/07/2016 22:52    Positive ROS: All other systems have been reviewed and were otherwise negative with the exception of those mentioned in the HPI and as above.  12 point ROS was performed.  Physical Exam: General: Alert and oriented.  No apparent distress.  Vascular:  Left foot:Dorsalis Pedis:  absent Posterior Tibial:  absent  Right foot: Patient status post BKA right-sided  Neuro:absent sensation  Derm: Left foot plantar first MTPJ had a hyperkeratotic lesion upon removal there was no open ulceration. On the distal aspect of the left  great toe there was a small superficial hyperkeratosis without ulceration. On the dorsal aspect of the foot is a small eschar sected. This is less than a centimeter in diameter. He has a dry small Shawn the posterior aspect of his left heel as well. This is noninfected.  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 He of the left lower leg is noted. No edema.   Assessment: Severe peripheral vascular disease with noninfected ulcerations left lower extremity Status post right-sided BKA Plan: The ulcerative sites look to be very stable without signs of infection at all. These are dry. He manages continued monitoring with pressure producing techniques including those which were ordered today. No current active infection to the left foot at this time. Please reconsult if worsening symptoms occur to the foot.    Irean Hong, DPM Cell [phone]   07/08/2016 4:19 PM

## 2016-07-08 NOTE — Progress Notes (Signed)
PHARMACY - PHYSICIAN COMMUNICATION CRITICAL VALUE ALERT - BLOOD CULTURE IDENTIFICATION (BCID)  No results found for this or any previous visit.  07/08/16 20:45 lab reports GPR in anaerobic bottle of 1 set (1 of 4 bottles). Says there are a lot of them in long, thin chains, no species detected on BCID.   Name of physician (or Provider) Contacted: Dr. Elisabeth Pigeon  Changes to prescribed antibiotics required: None at this time - continue vanc/Zosyn.   Carola Frost, Pharm.D., BCPS Clinical Pharmacist 07/08/2016  8:50 PM

## 2016-07-08 NOTE — Progress Notes (Signed)
Transfusion complete. Pt tolerated 1 unit,  .

## 2016-07-08 NOTE — ED Notes (Addendum)
Judeth Cornfield EDT alerted me that during changing the patients brief before transport that he had a decubitus ulcer on his sacrum.  Lurena Joiner, RN is applying sacral pad.  I notified Lawson Fiscal, RN of this new discovery prior to his transfer to her care.

## 2016-07-08 NOTE — Progress Notes (Addendum)
Pt complains of shortness of breath, O2 saturation is 100% on 2 liters O2. Repositioned for comfort, pt noted with increased effort with respirations, respiratory rate is 20-24, with HR 90's. BP is stable. Pt denies chest pain, no audible wheezes, right lung sounds are slightly diminished. Paged MD. New orders placed for chest xray and lasix  IV one time dose. Will continue to monitor.

## 2016-07-09 ENCOUNTER — Inpatient Hospital Stay: Payer: Commercial Managed Care - PPO

## 2016-07-09 LAB — TYPE AND SCREEN
ABO/RH(D): O POS
ANTIBODY SCREEN: NEGATIVE
UNIT DIVISION: 0

## 2016-07-09 LAB — CBC WITH DIFFERENTIAL/PLATELET
BASOS ABS: 0.1 10*3/uL (ref 0–0.1)
Basophils Relative: 0 %
Eosinophils Absolute: 0 10*3/uL (ref 0–0.7)
Eosinophils Relative: 0 %
HCT: 23.1 % — ABNORMAL LOW (ref 40.0–52.0)
Hemoglobin: 7.7 g/dL — ABNORMAL LOW (ref 13.0–18.0)
LYMPHS PCT: 5 %
Lymphs Abs: 0.8 10*3/uL — ABNORMAL LOW (ref 1.0–3.6)
MCH: 30.2 pg (ref 26.0–34.0)
MCHC: 33.3 g/dL (ref 32.0–36.0)
MCV: 90.5 fL (ref 80.0–100.0)
MONO ABS: 0.6 10*3/uL (ref 0.2–1.0)
MONOS PCT: 4 %
NEUTROS ABS: 15 10*3/uL — AB (ref 1.4–6.5)
Neutrophils Relative %: 91 %
PLATELETS: 221 10*3/uL (ref 150–440)
RBC: 2.55 MIL/uL — AB (ref 4.40–5.90)
RDW: 16.4 % — ABNORMAL HIGH (ref 11.5–14.5)
WBC: 16.5 10*3/uL — ABNORMAL HIGH (ref 3.8–10.6)

## 2016-07-09 LAB — BASIC METABOLIC PANEL
ANION GAP: 8 (ref 5–15)
BUN: 33 mg/dL — ABNORMAL HIGH (ref 6–20)
CO2: 21 mmol/L — ABNORMAL LOW (ref 22–32)
Calcium: 8.2 mg/dL — ABNORMAL LOW (ref 8.9–10.3)
Chloride: 107 mmol/L (ref 101–111)
Creatinine, Ser: 1.41 mg/dL — ABNORMAL HIGH (ref 0.61–1.24)
GFR, EST AFRICAN AMERICAN: 59 mL/min — AB (ref >60)
GFR, EST NON AFRICAN AMERICAN: 51 mL/min — AB (ref >60)
Glucose, Bld: 173 mg/dL — ABNORMAL HIGH (ref 65–99)
POTASSIUM: 3.6 mmol/L (ref 3.5–5.1)
SODIUM: 136 mmol/L (ref 135–145)

## 2016-07-09 LAB — INFLUENZA PANEL BY PCR (TYPE A & B)
Influenza A By PCR: NEGATIVE
Influenza B By PCR: NEGATIVE

## 2016-07-09 LAB — GLUCOSE, CAPILLARY
GLUCOSE-CAPILLARY: 188 mg/dL — AB (ref 65–99)
GLUCOSE-CAPILLARY: 210 mg/dL — AB (ref 65–99)
GLUCOSE-CAPILLARY: 264 mg/dL — AB (ref 65–99)
Glucose-Capillary: 200 mg/dL — ABNORMAL HIGH (ref 65–99)
Glucose-Capillary: 214 mg/dL — ABNORMAL HIGH (ref 65–99)
Glucose-Capillary: 248 mg/dL — ABNORMAL HIGH (ref 65–99)

## 2016-07-09 LAB — BPAM RBC
BLOOD PRODUCT EXPIRATION DATE: 201804112359
ISSUE DATE / TIME: 201804011501
UNIT TYPE AND RH: 5100

## 2016-07-09 LAB — HEMOGLOBIN A1C
Hgb A1c MFr Bld: 7.8 % — ABNORMAL HIGH (ref 4.8–5.6)
Mean Plasma Glucose: 177 mg/dL

## 2016-07-09 LAB — SEDIMENTATION RATE: SED RATE: 125 mm/h — AB (ref 0–20)

## 2016-07-09 LAB — C-REACTIVE PROTEIN: CRP: 31.8 mg/dL — ABNORMAL HIGH (ref <1.0)

## 2016-07-09 MED ORDER — GLUCERNA SHAKE PO LIQD
237.0000 mL | Freq: Three times a day (TID) | ORAL | Status: DC
  Administered 2016-07-09 – 2016-07-11 (×8): 237 mL via ORAL

## 2016-07-09 MED ORDER — ACETAMINOPHEN 325 MG PO TABS
650.0000 mg | ORAL_TABLET | Freq: Once | ORAL | Status: AC
  Administered 2016-07-09: 650 mg via ORAL
  Filled 2016-07-09: qty 2

## 2016-07-09 MED ORDER — ALBUTEROL SULFATE (2.5 MG/3ML) 0.083% IN NEBU
2.5000 mg | INHALATION_SOLUTION | Freq: Four times a day (QID) | RESPIRATORY_TRACT | Status: DC | PRN
  Administered 2016-07-09 – 2016-07-10 (×2): 2.5 mg via RESPIRATORY_TRACT
  Filled 2016-07-09 (×2): qty 3

## 2016-07-09 MED ORDER — PIPERACILLIN SOD-TAZOBACTAM SO 2.25 (2-0.25) G IV SOLR
3.3750 g | Freq: Three times a day (TID) | INTRAVENOUS | Status: DC
  Administered 2016-07-09 – 2016-07-11 (×5): 3.375 g via INTRAVENOUS
  Filled 2016-07-09 (×7): qty 3.38

## 2016-07-09 MED ORDER — VANCOMYCIN HCL 10 G IV SOLR
750.0000 mg | INTRAVENOUS | Status: DC
  Administered 2016-07-09 – 2016-07-11 (×3): 750 mg via INTRAVENOUS
  Filled 2016-07-09 (×3): qty 750

## 2016-07-09 NOTE — Progress Notes (Signed)
[place] [facility] [place] NAME: Damon [name]    MR#:  [id number]  DATE OF BIRTH:  [birth year]  SUBJECTIVE: Low-grade temperature. Short of breath today but chest x-ray clear without pneumonia, O2 sats 100%.   CHIEF COMPLAINT:   Chief Complaint  Patient presents with  . Hyperglycemia   No change. Poor historian. Does not complain of any pain or shortness of breath. Continues to be febrile.  REVIEW OF SYSTEMS:    Review of Systems  Constitutional: Positive for fever and malaise/fatigue. Negative for chills.  HENT: Negative for sore throat.   Eyes: Negative for blurred vision, double vision and pain.  Respiratory: Negative for cough, hemoptysis, shortness of breath and wheezing.   Cardiovascular: Negative for chest pain, palpitations, orthopnea and leg swelling.  Gastrointestinal: Negative for abdominal pain, constipation, diarrhea, heartburn, nausea and vomiting.  Genitourinary: Negative for dysuria and hematuria.  Musculoskeletal: Negative for back pain and joint pain.  Skin: Negative for rash.  Neurological: Negative for sensory change, speech change, focal weakness and headaches.  Endo/Heme/Allergies: Does not bruise/bleed easily.  Psychiatric/Behavioral: Negative for depression. The patient is not nervous/anxious.     DRUG ALLERGIES:  No Known Allergies  VITALS:  Blood pressure 123/61, pulse 93, temperature 98.6 F (37 C), temperature source Oral, resp. rate 16, height '5\' 6"'  (1.676 m), weight 61.3 kg (135 lb 3.2 oz), SpO2 100 %.  PHYSICAL EXAMINATION:   Physical Exam  GENERAL:  66 y.o.-year-old patient lying in the bed with no acute distress.  EYES: Pupils equal, round, reactive to light and accommodation. No scleral icterus. Extraocular muscles intact.  HEENT: Head atraumatic, normocephalic. Oropharynx and nasopharynx clear.  NECK:  Supple, no jugular venous distention. No thyroid enlargement, no tenderness.  LUNGS: Normal breath  sounds bilaterally, no wheezing, rales, rhonchi. No use of accessory muscles of respiration.  CARDIOVASCULAR: S1, S2 normal. No murmurs, rubs, or gallops.  ABDOMEN: Soft, nontender, nondistended. Bowel sounds present. No organomegaly or mass.  EXTREMITIES: Crusting and ulcers on the left foot. Right below-knee amputation. Patient has left foot plantar first metatarsophalangeal joint has hyperkeratotic lesion without any open ulcer. NEUROLOGIC: Cranial nerves II through XII are intact. No focal Motor or sensory deficits b/l.   PSYCHIATRIC: The patient is alert and awake SKIN: No obvious rash, lesion, or ulcer.  Sacral decubitus ulcer with clean granulation tissue  LABORATORY PANEL:   CBC  Recent Labs Lab 07/09/16 0348  WBC 16.5*  HGB 7.7*  HCT 23.1*  PLT 221   ------------------------------------------------------------------------------------------------------------------ Chemistries   Recent Labs Lab 07/07/16 2207  07/09/16 0348  NA 135  < > 136  K 3.8  < > 3.6  CL 106  < > 107  CO2 22  < > 21*  GLUCOSE 477*  < > 173*  BUN 36*  < > 33*  CREATININE 1.33*  < > 1.41*  CALCIUM 8.4*  < > 8.2*  AST 84*  --   --   ALT 91*  --   --   ALKPHOS 87  --   --   BILITOT 0.4  --   --   < > = values in this interval not displayed. ------------------------------------------------------------------------------------------------------------------  Cardiac Enzymes No results for input(s): TROPONINI in the last 168 hours. ------------------------------------------------------------------------------------------------------------------  RADIOLOGY:  Dg Chest 1 View  Result Date: 07/08/2016 CLINICAL DATA:  Dyspnea EXAM: CHEST 1 VIEW COMPARISON:  07/07/2016 FINDINGS: The heart size and mediastinal contours are within normal limits. Aortic atherosclerosis. ACDF of  the visualized lower cervical spine. Both lungs are clear. The visualized skeletal structures are unremarkable. IMPRESSION: No  active disease.  Aortic atherosclerosis.  No significant change. Electronically Signed   By: Ashley Royalty M.D.   On: 07/08/2016 20:42   Dg Chest Port 1 View  Result Date: 07/07/2016 CLINICAL DATA:  Hyperglycemia EXAM: PORTABLE CHEST 1 VIEW COMPARISON:  06/11/2016 chest radiograph. FINDINGS: Surgical hardware overlies the lower cervical spine. Stable cardiomediastinal silhouette with normal heart size and aortic atherosclerosis. No pneumothorax. No pleural effusion. Lungs appear clear, with no acute consolidative airspace disease and no pulmonary edema. IMPRESSION: No active disease. Aortic atherosclerosis. Electronically Signed   By: Ilona Sorrel M.D.   On: 07/07/2016 22:52     ASSESSMENT AND PLAN:   * Sepsis. Etiology unclear. He does have some decubitus ulcers which could cause bacteremia. Ulcers don't look infected. On broad-spectrum antibiotics. Cultures pending.Patient is seen by podiatry, patient has severe PVD with noninfected ulcers in the left legs, also site looks clean, podiatric recommended monitoring with pressure offloading, WBC still up, low-grade temperature, check ESR, CRP, CT chest because of shortness of breath, continue aggressive antibiotics at this time..  * Worsening anemia of chronic disease. Transfused 1 unit packed RBC.  * Chronic systolic CHF with ejection fraction of 25-30%. Stable. No signs of fluid overload. The new Lasix, patient had drug-eluting stent in OM in oct 2017. Continue Plavix.no  Chest pain today.  * Diabetes mellitus. Sliding scale insulin.  * DVT prophylaxis with Lovenox.  All the records are reviewed and case discussed with Care Management/Social Workerr. Management plans discussed with the patient, family and they are in agreement.  CODE STATUS: FULL CODE  DVT Prophylaxis: SCDs  TOTAL TIME TAKING CARE OF THIS PATIENT: 35 minutes.   POSSIBLE D/C IN 2-3 DAYS, DEPENDING ON CLINICAL CONDITION.  Epifanio Lesches M.D on 07/09/2016 at 9:22  AM  Between 7am to 6pm - Pager - [phone]  After 6pm go to [email or web address] - password [email or web address] [facility]  Office  [phone]  CC: Primary care physician; Donnie Coffin, MD  Note: This dictation was prepared with Dragon dictation along with smaller phrase technology. Any transcriptional errors that result from this process are unintentional.

## 2016-07-09 NOTE — Progress Notes (Signed)
Patient temp 103.2. MD notified. RN gave PO Tylenol. MD ordered chest CT without contrast.  Harvie Heck, RN

## 2016-07-09 NOTE — NC FL2 (Signed)
[place] MEDICAID FL2 LEVEL OF CARE SCREENING TOOL     IDENTIFICATION  Patient Name: Damon [name] Birthdate: Jul 21, 1950 Sex: male Admission Date (Current Location): 07/07/2016  [place] and [place] Number:  Chiropodist and Address:  [facility], [address] [place], [place] [address]      Provider Number: [id number]  Attending Physician Name and Address:  Katha Hamming, MD  Relative Name and Phone Number:       Current Level of Care: Hospital Recommended Level of Care: Skilled Nursing Facility Prior Approval Number:    Date Approved/Denied:   PASRR Number:  [id number] )  Discharge Plan: SNF    Current Diagnoses: Patient Active Problem List   Diagnosis Date Noted  . Ulcer of left foot (HCC) 07/07/2016  . Hx of BKA, right (HCC) 06/18/2016  . PAD (peripheral artery disease) (HCC) 06/18/2016  . Atypical chest pain   . Preop cardiovascular exam   . Chronic systolic heart failure (HCC)   . Sepsis (HCC) 06/01/2016  . Stable angina (HCC)   . Anemia   . Cellulitis   . Hypertensive urgency   . Right foot ulcer (HCC) 05/03/2016  . Diabetes mellitus type 2 with complications (HCC) 05/02/2016  . 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 Protein-calorie malnutrition, severe 03/28/2016  . Acute renal failure (ARF) (HCC) 03/27/2016  . Hypotension 03/27/2016  . SOB (shortness of breath) 02/15/2016  . CKD (chronic kidney disease), stage II 02/15/2016  . Coronary artery disease 01/17/2016  . Essential hypertension 01/17/2016  . HLD (hyperlipidemia) 01/17/2016  . ST elevation myocardial infarction involving left anterior descending (LAD) coronary artery (HCC)   . Cardiomyopathy, ischemic   . Acute respiratory failure (HCC) 01/13/2016  . NSTEMI (non-ST elevated myocardial infarction) (HCC)     Orientation RESPIRATION BLADDER Height & Weight     Time, Place  Normal  Incontinent Weight: 135 lb 3.2 oz (61.3 kg) Height:   (167.6 cm)  BEHAVIORAL SYMPTOMS/MOOD NEUROLOGICAL BOWEL NUTRITION STATUS   (none )  (none) Incontinent Diet (Diet: Heart Healthy )  AMBULATORY STATUS COMMUNICATION OF NEEDS Skin   Extensive Assist Verbally PU Stage and Appropriate Care (pressure ulcer stage 3: on sacrum.)                       Personal Care Assistance Level of Assistance  Bathing, Feeding, Dressing Bathing Assistance: Limited assistance Feeding assistance: Independent Dressing Assistance: Limited assistance     Functional Limitations Info  Sight, Hearing, Speech Sight Info: Adequate Hearing Info: Adequate Speech Info: Adequate    SPECIAL CARE FACTORS FREQUENCY  PT (By licensed PT), OT (By licensed OT)     PT Frequency:  (5) OT Frequency:  (5)            Contractures      Additional Factors Info  Code Status, Allergies, Insulin Sliding Scale Code Status Info:  (Full Code. ) Allergies Info:  (No Known Allergies. )   Insulin Sliding Scale Info:  (NovoLog Insulin Injections. )       Current Medications (07/09/2016):  This is the current hospital active medication list Current Facility-Administered Medications  Medication Dose Route Frequency Provider Last Rate Last Dose  . acetaminophen (TYLENOL) tablet 650 mg  650 mg Oral Q6H PRN Oralia Manis, MD   650 mg at 07/09/16 0539   Or  . acetaminophen (TYLENOL) suppository 650 mg  650 mg Rectal  Q6H PRN Oralia Manis, MD      . albuterol (PROVENTIL) (2.5 MG/3ML) 0.083% nebulizer solution 2.5 mg  2.5 mg Nebulization Q6H PRN Katha Hamming, MD   2.5 mg at 07/09/16 1054  . atorvastatin (LIPITOR) tablet 80 mg  80 mg Oral q1800 Oralia Manis, MD   80 mg at 07/08/16 1742  . carvedilol (COREG) tablet 12.5 mg  12.5 mg Oral BID WC Oralia Manis, MD   12.5 mg at 07/09/16 0847  . clopidogrel (PLAVIX) tablet 75 mg  75 mg Oral Daily Oralia Manis, MD   75 mg at 07/09/16 0846  . enoxaparin (LOVENOX) injection  40 mg  40 mg Subcutaneous Q24H Oralia Manis, MD   40 mg at 07/09/16 0215  . hydrALAZINE (APRESOLINE) tablet 25 mg  25 mg Oral Q8H Oralia Manis, MD   25 mg at 07/09/16 0539  . insulin aspart (novoLOG) injection 0-9 Units  0-9 Units Subcutaneous TID AC & HS Oralia Manis, MD   2 Units at 07/09/16 0845  . lisinopril (PRINIVIL,ZESTRIL) tablet 40 mg  40 mg Oral Daily Oralia Manis, MD   40 mg at 07/09/16 0848  . ondansetron (ZOFRAN) tablet 4 mg  4 mg Oral Q6H PRN Oralia Manis, MD       Or  . ondansetron [facility]) injection 4 mg  4 mg Intravenous Q6H PRN Oralia Manis, MD      . pantoprazole (PROTONIX) EC tablet 40 mg  40 mg Oral Daily Oralia Manis, MD   40 mg at 07/09/16 0848  . piperacillin-tazobactam (ZOSYN) IVPB 3.375 g  3.375 g Intravenous Q8H Jene Every, MD   3.375 g at 07/09/16 0530  . tamsulosin (FLOMAX) capsule 0.4 mg  0.4 mg Oral Daily Oralia Manis, MD   0.4 mg at 07/09/16 0848  . vancomycin (VANCOCIN) 750 mg in dextrose 5 % 150 mL IVPB  750 mg Intravenous Q18H Cindi Carbon, [facility]         Discharge Medications: Please see discharge summary for a list of discharge medications.  Relevant Imaging Results:  Relevant Lab Results:   Additional Information  (SSN: [id number])  Harpreet Signore, Darleen Crocker, LCSW

## 2016-07-09 NOTE — Progress Notes (Signed)
Pharmacy Antibiotic Note  Damon [name] is a 66 y.o. male admitted on 07/07/2016 with sepsis.  Pharmacy has been consulted for Vancomycin and piperacillin/tazobactam dosing.  This is day #3 of antibiotic therapy.   Plan: Continue piperacillin/tazobactam 3.375 g IV q8h EI  Due to increase in SCr, will adjust vancomycin dose from 750 mg IV q12h to 750 mg IV q18h (with estimated trough of 16 mcg/mL) Goal vancomycin trough 15-20 mcg/mL VT ordered 4/4 at 0230 Will recheck SCr tomorrow  Height:  (167.6 cm) Weight: 135 lb 3.2 oz (61.3 kg) IBW/kg (Calculated) : 63.8  Temp (24hrs), Avg:100.8 F (38.2 C), Min:98.6 F (37 C), Max:102.9 F (39.4 C)   Recent Labs Lab 07/07/16 2207 07/08/16 0340 07/09/16 0348  WBC 14.4* 15.3* 16.5*  CREATININE 1.33* 1.20 1.41*  LATICACIDVEN 1.4  --   --     Estimated Creatinine Clearance: 45.9 mL/min (A) (by C-G formula based on SCr of 1.41 mg/dL (H)).    No Known Allergies  Micro: 3/31 Blood: GPR with no species identified on BCID panel, culture pending  Thank you for allowing pharmacy to be a part of this patient's care.  Cindi Carbon, PharmD, BCPS Clinical Pharmacist 07/09/2016

## 2016-07-09 NOTE — Progress Notes (Signed)
Inpatient Diabetes Program Recommendations  AACE/ADA: New Consensus Statement on Inpatient Glycemic Control (2015)  Target Ranges:  Prepandial:   less than 140 mg/dL      Peak postprandial:   less than 180 mg/dL (1-2 hours)      Critically ill patients:  140 - 180 mg/dL   Results for KALAI, BACA (MRN [id number]) as of 07/09/2016 09:29  Ref. Range 07/08/2016 07:32 07/08/2016 11:42 07/08/2016 16:18 07/08/2016 22:04 07/09/2016 07:24  Glucose-Capillary Latest Ref Range: 65 - 99 mg/dL 629 (H) 528 (H) 413 (H) 207 (H) 188 (H)    Admit with: Fever/ Sepsis/ Hyperglycemia  History: DM, CKD  Home DM Meds: Lantus 14 units QHS       Metformin 500 mg BID  Current Insulin Orders: Novolog Sensitive Correction Scale/ SSI (0-9 units) TID AC + HS       MD- Please consider the following in-hospital insulin adjustments:  Start Lantus 10 units QHS (75% home dose)     --Will follow patient during hospitalization--  Ambrose Finland RN, MSN, CDE Diabetes Coordinator Inpatient Glycemic Control Team Team Pager: [phone] (8a-5p)

## 2016-07-09 NOTE — Clinical Social Work Note (Signed)
Clinical Social Work Assessment  Patient Details  Name: Damon [name] MRN: [id number] Date of Birth: 1950-06-10  Date of referral:  07/09/16               Reason for consult:  Other (Comment Required) (From [facility] STR)                Permission sought to share information with:  Facility Art therapist granted to share information::  Yes, Verbal Permission Granted  Name::        Agency::     Relationship::     Contact Information:     Housing/Transportation Living arrangements for the past 2 months:  [place] of Information:  Patient, Facility Patient Interpreter Needed:  None Criminal Activity/Legal Involvement Pertinent to Current Situation/Hospitalization:  No - Comment as needed Significant Relationships:  Spouse Lives with:  Facility Resident, Spouse Do you feel safe going back to the place where you live?  Yes Need for family participation in patient care:  Yes (Comment)  Care giving concerns:  Patient is from [facility] where he is a short term rehab resident.    Social Worker assessment / plan:  Holiday representative (CSW) reviewed chart and noted that patient is from [facility]. Per Alger Simons liaison patient is a short term rehab resident and can return when stable. Per Broadus John a new [employer] authorization will have to be received. CSW met with patient alone at bedside to discuss D/C plan. Patient was alert and oriented and was laying in the bed. CSW is familiar with patient and has worked with him several times. Patient reported that he is at [facility] for rehab and plans on returning there. Patient reported that he lives in [place] with his wife Damon [name] CSW will continue to follow and assist as needed.   Employment status:  Retired Forensic scientist:  Managed Care PT Recommendations:  Not assessed at this time Information / Referral to community resources:  [place]  Patient/Family's Response to care:  Patient is  agreeable to return to [facility].   Patient/Family's Understanding of and Emotional Response to Diagnosis, Current Treatment, and Prognosis:  Patient was very pleasant and thanked CSW for visit.   Emotional Assessment Appearance:  Appears stated age Attitude/Demeanor/Rapport:    Affect (typically observed):  Accepting, Adaptable, Pleasant Orientation:  Oriented to Self, Oriented to Place, Oriented to  Time, Fluctuating Orientation (Suspected and/or reported Sundowners) Alcohol / Substance use:  Not Applicable Psych involvement (Current and /or in the community):  No (Comment)  Discharge Needs  Concerns to be addressed:  Discharge Planning Concerns Readmission within the last 30 days:  No Current discharge risk:  Dependent with Mobility, Chronically ill Barriers to Discharge:  Continued Medical Work up   [employer], Veronia Beets, LCSW 07/09/2016, 4:50 PM

## 2016-07-09 NOTE — Progress Notes (Signed)
Initial Nutrition Assessment  DOCUMENTATION CODES:   Severe malnutrition in context of chronic illness  INTERVENTION:  1. Glucerna Shake po TID, each supplement provides 220 kcal and 10 grams of protein  NUTRITION DIAGNOSIS:   Malnutrition related to chronic illness as evidenced by moderate depletion of body fat, moderate depletions of muscle mass, percent weight loss.  GOAL:   Patient will meet greater than or equal to 90% of their needs  MONITOR:   PO intake, I & O's, Labs, Supplement acceptance  REASON FOR ASSESSMENT:   Low Braden    ASSESSMENT:   Damon [name]  is a 66 y.o. male who presents with Fever and hyperglycemia. Patient had 103.1 fever per EMS, here in the ED was 101. Has an elevated white blood cell count, and is tachycardic  Spoke with Damon [name] at bedside.  He continues to be flat similar to previous admissions. Claims he was eating 3 meals per day at his facility. Reports normal body weight of 130# but according to chart he exhibits a 25#/16% severe wt loss over 2.25 months. 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  Had eggs, grits, fruit, and toast this morning - reports he consumed most of it. No documented PO in chart.  Nutrition-Focused physical exam completed. Findings are moderate fat depletion, moderate muscle depletion, and no edema.   Labs and medications reviewed: CBGs 188-210  Diet Order:  Diet heart healthy/carb modified Room service appropriate? Yes; Fluid consistency: Thin  Skin:  Wound (see comment) (Stg III PU to sacrum)  Last BM:  07/08/2016  Height:   Ht Readings from Last 1 Encounters:  07/07/16  (1.676 m)    Weight:   Wt Readings from Last 1 Encounters:  07/09/16 135 lb 3.2 oz (61.3 kg)    Ideal Body Weight:  60.7 kg  BMI:  Body mass index is 21.82 kg/m.  Estimated Nutritional Needs:   Kcal:  1850-2150 calories  Protein:  80-92 gm  Fluid:  >/= 1.9L  EDUCATION NEEDS:   No  education needs identified at this time  [name]. Daija Routson, MS, RD LDN Inpatient Clinical Dietitian Pager [phone]

## 2016-07-09 NOTE — Progress Notes (Addendum)
Temp recheck 1 hour later is still 103.1. MD notified. Flu test, blood culture x 2, wound consult (stage 3 pressure ulcer on sacrum), and another  tylenol 1 time dose ordered (verbal taken over the phone).  Pt is alert and oriented. Pt covers removed to promote temp decrease.  *note: blood cultures already resulted when going to put this order in. MD text paged again to review results. Blood cultures not ordered.  Salomon Mast, RN 07/09/16 @ 435pm.

## 2016-07-10 LAB — CREATININE, SERUM
Creatinine, Ser: 1.63 mg/dL — ABNORMAL HIGH (ref 0.61–1.24)
GFR calc Af Amer: 50 mL/min — ABNORMAL LOW (ref >60)
GFR calc non Af Amer: 43 mL/min — ABNORMAL LOW (ref >60)

## 2016-07-10 LAB — GLUCOSE, CAPILLARY
GLUCOSE-CAPILLARY: 235 mg/dL — AB (ref 65–99)
Glucose-Capillary: 212 mg/dL — ABNORMAL HIGH (ref 65–99)
Glucose-Capillary: 283 mg/dL — ABNORMAL HIGH (ref 65–99)
Glucose-Capillary: 237 mg/dL — ABNORMAL HIGH (ref 65–99)

## 2016-07-10 LAB — MAGNESIUM: Magnesium: 1.8 mg/dL (ref 1.7–2.4)

## 2016-07-10 MED ORDER — INSULIN GLARGINE 100 UNIT/ML ~~LOC~~ SOLN
10.0000 [IU] | Freq: Every day | SUBCUTANEOUS | Status: DC
  Administered 2016-07-10: 10 [IU] via SUBCUTANEOUS
  Filled 2016-07-10 (×2): qty 0.1

## 2016-07-10 NOTE — Progress Notes (Signed)
Inpatient Diabetes Program Recommendations  AACE/ADA: New Consensus Statement on Inpatient Glycemic Control (2015)  Target Ranges:  Prepandial:   less than 140 mg/dL      [facility] postprandial:   less than 180 mg/dL (1-2 hours)      Critically ill patients:  140 - 180 mg/dL   Lab Results  Component Value Date   GLUCAP 212 (H) 07/10/2016   HGBA1C 7.8 (H) 07/08/2016    Results for Damon [name], Damon [name] (MRN [id number]) as of 07/10/2016 10:07  Ref. Range 07/09/2016 11:19 07/09/2016 16:16 07/09/2016 20:15 07/09/2016 21:42 07/10/2016 07:34  Glucose-Capillary Latest Ref Range: 65 - 99 mg/dL 528 (H) 413 (H) 244 (H) 248 (H) 212 (H)     Home DM Meds: Lantus 14 units QHS                             Metformin 500 mg BID  Current Insulin Orders: Novolog Sensitive Correction Scale/ SSI (0-9 units) TID AC + HS   Consider starting Lantus 10 units QHS (75% home dose)  Susette Racer, RN, BA, [place], CDE Diabetes Coordinator Inpatient Diabetes Program  [phone] (Team Pager) [phone] [facility] Office) 07/10/2016 10:07 AM

## 2016-07-10 NOTE — Progress Notes (Signed)
Pt temp 101, PRN Tylenol given. MD Gwenlyn Found paged and notified. No orders at this time

## 2016-07-10 NOTE — Progress Notes (Addendum)
[place] at [place] NAME: Damon [name]    MR#:  [id number]  DATE OF BIRTH:  11-19-1950  SUBJECTIVE:  Fever up to 62 Fahrenheit yesterday, noticed  sacral decubiti,Stage 3  ,patient is from [facility], according to patient, wife he is been having this decubiti for a long time.  seen at bedside, and no fever today morning but complains of shortness of breath but no hypoxia.   CHIEF COMPLAINT:   Chief Complaint  Patient presents with  . Hyperglycemia   No change. Poor historian REVIEW OF SYSTEMS:    Review of Systems  Constitutional: Positive for fever and malaise/fatigue. Negative for chills.  HENT: Negative for sore throat.   Eyes: Negative for blurred vision, double vision and pain.  Respiratory: Negative for cough, hemoptysis, shortness of breath and wheezing.   Cardiovascular: Negative for chest pain, palpitations, orthopnea and leg swelling.  Gastrointestinal: Negative for abdominal pain, constipation, diarrhea, heartburn, nausea and vomiting.  Genitourinary: Negative for dysuria and hematuria.  Musculoskeletal: Negative for back pain and joint pain.  Skin: Negative for rash.  Neurological: Negative for sensory change, speech change, focal weakness and headaches.  Endo/Heme/Allergies: Does not bruise/bleed easily.  Psychiatric/Behavioral: Negative for depression. The patient is not nervous/anxious.     DRUG ALLERGIES:  No Known Allergies  VITALS:  Blood pressure (!) 118/49, pulse 86, temperature 98.8 F (37.1 C), temperature source Oral, resp. rate 20, height '5\' 6"'  (1.676 m), weight 62.8 kg (138 lb 6.4 oz), SpO2 100 %.  PHYSICAL EXAMINATION:   Physical Exam  GENERAL:  66 y.o.-year-old patient lying in the bed with no acute distress.  EYES: Pupils equal, round, reactive to light and accommodation. No scleral icterus. Extraocular muscles intact.  HEENT: Head atraumatic, normocephalic. Oropharynx and nasopharynx clear.  NECK:   Supple, no jugular venous distention. No thyroid enlargement, no tenderness.  LUNGS: Normal breath sounds bilaterally, no wheezing, rales, rhonchi. No use of accessory muscles of respiration.  CARDIOVASCULAR: S1, S2 normal. No murmurs, rubs, or gallops.  ABDOMEN: Soft, nontender, nondistended. Bowel sounds present. No organomegaly or mass.  EXTREMITIES: Crusting and ulcers on the left foot. Right below-knee amputation. Patient has left foot plantar first metatarsophalangeal joint has hyperkeratotic lesion without any open ulcer. NEUROLOGIC: Cranial nerves II through XII are intact. No focal Motor or sensory deficits b/l.   PSYCHIATRIC: The patient is alert and awake SKIN: No obvious rash, lesion, or ulcer.  Sacral decubitus ulcer with clean granulation tissue,stage 3  LABORATORY PANEL:   CBC  Recent Labs Lab 07/09/16 0348  WBC 16.5*  HGB 7.7*  HCT 23.1*  PLT 221   ------------------------------------------------------------------------------------------------------------------ Chemistries   Recent Labs Lab 07/07/16 2207  07/09/16 0348 07/10/16 0409  NA 135  < > 136  --   K 3.8  < > 3.6  --   CL 106  < > 107  --   CO2 22  < > 21*  --   GLUCOSE 477*  < > 173*  --   BUN 36*  < > 33*  --   CREATININE 1.33*  < > 1.41* 1.63*  CALCIUM 8.4*  < > 8.2*  --   AST 84*  --   --   --   ALT 91*  --   --   --   ALKPHOS 87  --   --   --   BILITOT 0.4  --   --   --   < > =  values in this interval not displayed. ------------------------------------------------------------------------------------------------------------------  Cardiac Enzymes No results for input(s): TROPONINI in the last 168 hours. ------------------------------------------------------------------------------------------------------------------  RADIOLOGY:  Dg Chest 1 View  Result Date: 07/08/2016 CLINICAL DATA:  Dyspnea EXAM: CHEST 1 VIEW COMPARISON:  07/07/2016 FINDINGS: The heart size and mediastinal contours are  within normal limits. Aortic atherosclerosis. ACDF of the visualized lower cervical spine. Both lungs are clear. The visualized skeletal structures are unremarkable. IMPRESSION: No active disease.  Aortic atherosclerosis.  No significant change. Electronically Signed   By: Ashley Royalty M.D.   On: 07/08/2016 20:42   Ct Chest Wo Contrast  Result Date: 07/09/2016 CLINICAL DATA:  Sepsis.  New shortness of breath. EXAM: CT CHEST WITHOUT CONTRAST TECHNIQUE: Multidetector CT imaging of the chest was performed following the standard protocol without IV contrast. COMPARISON:  Chest x-ray from yesterday FINDINGS: Cardiovascular: Borderline cardiomegaly. Minimal anterior pericardial fluid. Atherosclerosis, including the coronary arteries. Left coronary stenting. No acute vascular finding. Mediastinum/Nodes: Negative for mass or adenopathy. Lungs/Pleura: Motion degraded. Minor dependent atelectasis. No pulmonary edema or consolidation. Generalized airway thickening, likely related patient's smoking status. Upper Abdomen: No acute finding Musculoskeletal: No acute finding. Degenerative appearing endplate irregularities with spurring. Symmetric gynecomastia. IMPRESSION: Negative for failure or pneumonia.  Mild dependent atelectasis. Electronically Signed   By: Monte Fantasia M.D.   On: 07/09/2016 15:55     ASSESSMENT AND PLAN:   * Sepsis. Etiology unclear. He does have some decubitus ulcers which could cause bacteremia.  On broad-spectrum antibiotics. .Patient is seen by podiatry, patient has severe PVD with noninfected ulcers in the left legs, also site looks clean, podiatric recommended monitoring with pressure offloading, Continued sepsis with fever, elevated CRP at 31, ESR 125; patient flu test negative..Cultures from March 31 showed gram-positive rods in aerobic bottle: Patient on Zosyn, continue Zosyn, request ID consult Stage III sacral decubiti: Consult wound care,ID,Continue vancomycin, added  vancomycin  yesterday. * Worsening anemia of chronic disease. Transfused 1 unit packed RBC.  * Chronic systolic CHF with ejection fraction of 25-30%. Stable. No signs of fluid overload. The new Lasix, patient had drug-eluting stent in OM in oct 2017. The new Plavix.  SVT: Patient had 8 beat run of V. tach last night but did not  recur Consult cardiology if V. Tach happens again/   * Diabetes mellitus. Sliding scale insulin., Started Lantus as per diabetes coordinator recommendation.  * DVT prophylaxis with Lovenox.  d/w  patient's wife. All the records are reviewed and case discussed with Care Management/Social Workerr. Management plans discussed with the patient, family and they are in agreement.  CODE STATUS: FULL CODE  DVT Prophylaxis: SCDs  TOTAL TIME TAKING CARE OF THIS PATIENT: 35 minutes.   POSSIBLE D/C IN 2-3 DAYS, DEPENDING ON CLINICAL CONDITION.  Epifanio Lesches M.D on 07/10/2016 at 12:06 PM  Between 7am to 6pm - Pager - [phone]  After 6pm go to [email or web address] - password [email or web address] [facility]  Office  [phone]  CC: Primary care physician; Donnie Coffin, MD  Note: This dictation was prepared with Dragon dictation along with smaller phrase technology. Any transcriptional errors that result from this process are unintentional.

## 2016-07-10 NOTE — Progress Notes (Signed)
Notified by central telemetry that patient had an 8 beat run of V-tach, patient's visitor stated that patient was "looking uncomfortable and sweaty all of a sudden." VSS, CBG 264. Patient states he feels "ok, " with no complaints. Nursing continues to monitor and assist patient as needed.

## 2016-07-10 NOTE — Progress Notes (Signed)
Central tele called to notify pt had 8 beat run SVT, vitals taken (see flowsheet), MD Carley Hammed paged and notified. Orders received for Lab magnesium . Order placed and lab notified.

## 2016-07-11 ENCOUNTER — Inpatient Hospital Stay: Payer: Commercial Managed Care - PPO

## 2016-07-11 LAB — GLUCOSE, CAPILLARY
GLUCOSE-CAPILLARY: 110 mg/dL — AB (ref 65–99)
Glucose-Capillary: 217 mg/dL — ABNORMAL HIGH (ref 65–99)
Glucose-Capillary: 317 mg/dL — ABNORMAL HIGH (ref 65–99)
Glucose-Capillary: 156 mg/dL — ABNORMAL HIGH (ref 65–99)

## 2016-07-11 LAB — CULTURE, BLOOD (ROUTINE X 2)
SPECIAL REQUESTS: ADEQUATE
Special Requests: ADEQUATE

## 2016-07-11 LAB — BASIC METABOLIC PANEL
ANION GAP: 7 (ref 5–15)
BUN: 56 mg/dL — AB (ref 6–20)
CHLORIDE: 105 mmol/L (ref 101–111)
CO2: 21 mmol/L — AB (ref 22–32)
Calcium: 7.7 mg/dL — ABNORMAL LOW (ref 8.9–10.3)
Creatinine, Ser: 1.9 mg/dL — ABNORMAL HIGH (ref 0.61–1.24)
GFR calc Af Amer: 41 mL/min — ABNORMAL LOW (ref >60)
GFR, EST NON AFRICAN AMERICAN: 36 mL/min — AB (ref >60)
GLUCOSE: 205 mg/dL — AB (ref 65–99)
POTASSIUM: 3.3 mmol/L — AB (ref 3.5–5.1)
Sodium: 133 mmol/L — ABNORMAL LOW (ref 135–145)

## 2016-07-11 LAB — CBC
HEMATOCRIT: 20.9 % — AB (ref 40.0–52.0)
HEMOGLOBIN: 7 g/dL — AB (ref 13.0–18.0)
MCH: 29.9 pg (ref 26.0–34.0)
MCHC: 33.6 g/dL (ref 32.0–36.0)
MCV: 89.1 fL (ref 80.0–100.0)
PLATELETS: 256 10*3/uL (ref 150–440)
RBC: 2.35 MIL/uL — AB (ref 4.40–5.90)
RDW: 16.5 % — ABNORMAL HIGH (ref 11.5–14.5)
WBC: 19.1 10*3/uL — AB (ref 3.8–10.6)

## 2016-07-11 LAB — VANCOMYCIN, TROUGH: Vancomycin Tr: 19 ug/mL (ref 15–20)

## 2016-07-11 MED ORDER — SODIUM CHLORIDE 0.9 % IV SOLN
3.0000 g | Freq: Four times a day (QID) | INTRAVENOUS | Status: DC
  Administered 2016-07-11 – 2016-07-12 (×4): 3 g via INTRAVENOUS
  Filled 2016-07-11 (×6): qty 3

## 2016-07-11 MED ORDER — IOPAMIDOL (ISOVUE-300) INJECTION 61%: 30.0000 mL | Freq: Once | INTRAVENOUS | Status: DC

## 2016-07-11 MED ORDER — CARVEDILOL 6.25 MG PO TABS
6.2500 mg | ORAL_TABLET | Freq: Two times a day (BID) | ORAL | Status: DC
  Administered 2016-07-11 – 2016-07-12 (×3): 6.25 mg via ORAL
  Filled 2016-07-11 (×4): qty 1

## 2016-07-11 MED ORDER — INSULIN ASPART 100 UNIT/ML ~~LOC~~ SOLN
0.0000 [IU] | SUBCUTANEOUS | Status: DC
  Administered 2016-07-11: 3 [IU] via SUBCUTANEOUS
  Administered 2016-07-11: 11 [IU] via SUBCUTANEOUS
  Administered 2016-07-12: 3 [IU] via SUBCUTANEOUS
  Administered 2016-07-13: 2 [IU] via SUBCUTANEOUS
  Administered 2016-07-13: 8 [IU] via SUBCUTANEOUS
  Administered 2016-07-13: 5 [IU] via SUBCUTANEOUS
  Administered 2016-07-13: 8 [IU] via SUBCUTANEOUS
  Administered 2016-07-14: 5 [IU] via SUBCUTANEOUS
  Administered 2016-07-14: 2 [IU] via SUBCUTANEOUS
  Administered 2016-07-14: 8 [IU] via SUBCUTANEOUS
  Administered 2016-07-14: 2 [IU] via SUBCUTANEOUS
  Administered 2016-07-14 (×2): 5 [IU] via SUBCUTANEOUS
  Administered 2016-07-15 (×2): 3 [IU] via SUBCUTANEOUS
  Administered 2016-07-15: 8 [IU] via SUBCUTANEOUS
  Administered 2016-07-15 – 2016-07-16 (×5): 5 [IU] via SUBCUTANEOUS
  Administered 2016-07-16 – 2016-07-17 (×2): 3 [IU] via SUBCUTANEOUS
  Administered 2016-07-17: 15 [IU] via SUBCUTANEOUS
  Administered 2016-07-17: 3 [IU] via SUBCUTANEOUS
  Administered 2016-07-17: 8 [IU] via SUBCUTANEOUS
  Administered 2016-07-18 (×3): 5 [IU] via SUBCUTANEOUS
  Administered 2016-07-18: 8 [IU] via SUBCUTANEOUS
  Filled 2016-07-11: qty 8
  Filled 2016-07-11: qty 5
  Filled 2016-07-11: qty 15
  Filled 2016-07-11 (×2): qty 5
  Filled 2016-07-11: qty 2
  Filled 2016-07-11: qty 5
  Filled 2016-07-11: qty 3
  Filled 2016-07-11: qty 5
  Filled 2016-07-11: qty 8
  Filled 2016-07-11: qty 3
  Filled 2016-07-11 (×2): qty 5
  Filled 2016-07-11 (×2): qty 3
  Filled 2016-07-11: qty 2
  Filled 2016-07-11: qty 8
  Filled 2016-07-11: qty 5
  Filled 2016-07-11: qty 3
  Filled 2016-07-11: qty 2
  Filled 2016-07-11: qty 3
  Filled 2016-07-11 (×3): qty 8
  Filled 2016-07-11: qty 3
  Filled 2016-07-11 (×4): qty 5

## 2016-07-11 MED ORDER — SODIUM CHLORIDE 0.9 % IV SOLN: 750.0000 mg | INTRAVENOUS | Status: DC

## 2016-07-11 MED ORDER — INSULIN GLARGINE 100 UNIT/ML ~~LOC~~ SOLN
14.0000 [IU] | Freq: Every day | SUBCUTANEOUS | Status: DC
  Administered 2016-07-11: 14 [IU] via SUBCUTANEOUS
  Filled 2016-07-11 (×3): qty 0.14

## 2016-07-11 MED ORDER — HYDRALAZINE HCL 10 MG PO TABS
10.0000 mg | ORAL_TABLET | Freq: Three times a day (TID) | ORAL | Status: DC
  Administered 2016-07-11 – 2016-07-13 (×5): 10 mg via ORAL
  Filled 2016-07-11 (×8): qty 1

## 2016-07-11 MED ORDER — COLLAGENASE 250 UNIT/GM EX OINT
TOPICAL_OINTMENT | Freq: Every day | CUTANEOUS | Status: DC
  Administered 2016-07-11 – 2016-07-12 (×2): via TOPICAL
  Filled 2016-07-11: qty 30

## 2016-07-11 NOTE — Progress Notes (Signed)
Patient refused to drink ct contrast stated that it makes him sick to his stomach. Offered to give him Zofran he refused and stated that it tastes nasty and he cant drink it. CT notified.

## 2016-07-11 NOTE — Progress Notes (Signed)
Pharmacy Antibiotic Note  Damon [name] is a 66 y.o. male admitted on 07/07/2016 with sepsis.  Pharmacy has been consulted for Vancomycin and piperacillin/tazobactam dosing.  This is day #3 of antibiotic therapy.   Plan: Continue piperacillin/tazobactam 3.375 g IV q8h EI  Due to increase in SCr, will adjust vancomycin dose from 750 mg IV q12h to 750 mg IV q18h (with estimated trough of 16 mcg/mL) Goal vancomycin trough 15-20 mcg/mL VT ordered 4/4 at 0230 Will recheck SCr tomorrow  4/4 02:30 vanc level 19. Will change to 750 mg q 24 hours due to SCr continuing to increase. Level before 3rd new dose.  Height:  (167.6 cm) Weight: 138 lb (62.6 kg) IBW/kg (Calculated) : 63.8  Temp (24hrs), Avg:99.5 F (37.5 C), Min:98.2 F (36.8 C), Max:101 F (38.3 C)   Recent Labs Lab 07/07/16 2207 07/08/16 0340 07/09/16 0348 07/10/16 0409 07/11/16 0227  WBC 14.4* 15.3* 16.5*  --  19.1*  CREATININE 1.33* 1.20 1.41* 1.63* 1.90*  LATICACIDVEN 1.4  --   --   --   --   VANCOTROUGH  --   --   --   --  19    Estimated Creatinine Clearance: 34.8 mL/min (A) (by C-G formula based on SCr of 1.9 mg/dL (H)).    No Known Allergies  Micro: 3/31 Blood: GPR with no species identified on BCID panel, culture pending  Thank you for allowing pharmacy to be a part of this patient's care.  Erich Montane, PharmD, BCPS Clinical Pharmacist 07/11/2016

## 2016-07-11 NOTE — Progress Notes (Signed)
[place] [facility] at [place] NAME: Damon [name]    MR#:  [id number]  DATE OF BIRTH:  21-Sep-1950  SUBJECTIVE: Patient  Has low-grade fever 99.5 this morning, slight hypotension. Patient is a very poor historian and denies any complaints.   CHIEF COMPLAINT:   Chief Complaint  Patient presents with  . Hyperglycemia   No change. Poor historian REVIEW OF SYSTEMS:    Review of Systems  Constitutional: Positive for fever and malaise/fatigue. Negative for chills.  HENT: Negative for sore throat.   Eyes: Negative for blurred vision, double vision and pain.  Respiratory: Negative for cough, hemoptysis, shortness of breath and wheezing.   Cardiovascular: Negative for chest pain, palpitations, orthopnea and leg swelling.  Gastrointestinal: Negative for abdominal pain, constipation, diarrhea, heartburn, nausea and vomiting.  Genitourinary: Negative for dysuria and hematuria.  Musculoskeletal: Negative for back pain and joint pain.  Skin: Negative for rash.  Neurological: Negative for sensory change, speech change, focal weakness and headaches.  Endo/Heme/Allergies: Does not bruise/bleed easily.  Psychiatric/Behavioral: Negative for depression. The patient is not nervous/anxious.     DRUG ALLERGIES:  No Known Allergies  VITALS:  Blood pressure (!) 106/47, pulse 74, temperature 98.6 F (37 C), resp. rate 18, height 5' 6" (1.676 m), weight 62.6 kg (138 lb), SpO2 99 %.  PHYSICAL EXAMINATION:   Physical Exam  GENERAL:  66 y.o.-year-old patient lying in the bed with no acute distress.  EYES: Pupils equal, round, reactive to light and accommodation. No scleral icterus. Extraocular muscles intact.  HEENT: Head atraumatic, normocephalic. Oropharynx and nasopharynx clear.  NECK:  Supple, no jugular venous distention. No thyroid enlargement, no tenderness.  LUNGS: Normal breath sounds bilaterally, no wheezing, rales, rhonchi. No use of accessory muscles of  respiration.  CARDIOVASCULAR: S1, S2 normal. No murmurs, rubs, or gallops.  ABDOMEN: Soft, nontender, nondistended. Bowel sounds present. No organomegaly or mass.  EXTREMITIES: Crusting and ulcers on the left foot. Right below-knee amputation. Patient has left foot plantar first metatarsophalangeal joint has hyperkeratotic lesion without any open ulcer. NEUROLOGIC: Cranial nerves II through XII are intact. No focal Motor or sensory deficits b/l.   PSYCHIATRIC: The patient is alert and awake SKIN: No obvious rash, lesion, or ulcer.  Sacral decubitus ulcer with clean granulation tissue,stage 3  LABORATORY PANEL:   CBC  Recent Labs Lab 07/11/16 0227  WBC 19.1*  HGB 7.0*  HCT 20.9*  PLT 256   ------------------------------------------------------------------------------------------------------------------ Chemistries   Recent Labs Lab 07/07/16 2207  07/10/16 0409 07/11/16 0227  NA 135  < >  --  133*  K 3.8  < >  --  3.3*  CL 106  < >  --  105  CO2 22  < >  --  21*  GLUCOSE 477*  < >  --  205*  BUN 36*  < >  --  56*  CREATININE 1.33*  < > 1.63* 1.90*  CALCIUM 8.4*  < >  --  7.7*  MG  --   --  1.8  --   AST 84*  --   --   --   ALT 91*  --   --   --   ALKPHOS 87  --   --   --   BILITOT 0.4  --   --   --   < > = values in this interval not displayed. ------------------------------------------------------------------------------------------------------------------  Cardiac Enzymes No results for input(s): TROPONINI in the last 168 hours. ------------------------------------------------------------------------------------------------------------------  RADIOLOGY:  Ct Chest Wo Contrast  Result Date: 07/09/2016 CLINICAL DATA:  Sepsis.  New shortness of breath. EXAM: CT CHEST WITHOUT CONTRAST TECHNIQUE: Multidetector CT imaging of the chest was performed following the standard protocol without IV contrast. COMPARISON:  Chest x-ray from yesterday FINDINGS: Cardiovascular:  Borderline cardiomegaly. Minimal anterior pericardial fluid. Atherosclerosis, including the coronary arteries. Left coronary stenting. No acute vascular finding. Mediastinum/Nodes: Negative for mass or adenopathy. Lungs/Pleura: Motion degraded. Minor dependent atelectasis. No pulmonary edema or consolidation. Generalized airway thickening, likely related patient's smoking status. Upper Abdomen: No acute finding Musculoskeletal: No acute finding. Degenerative appearing endplate irregularities with spurring. Symmetric gynecomastia. IMPRESSION: Negative for failure or pneumonia.  Mild dependent atelectasis. Electronically Signed   By: Monte Fantasia M.D.   On: 07/09/2016 15:55     ASSESSMENT AND PLAN:   * Sepsis. Etiology unclear.  Stage III decubitus with elevated ESR, CRP.  On broad-spectrum antibiotics. .Patient is seen by podiatry, patient has severe PVD with noninfected ulcers in the left legs, also site looks clean, podiatric recommended monitoring with pressure offloading, ..Cultures from March 31 showed gram-positive rods in aerobic bottle: Patient on  vanco,Zosyn, continue Zosyn appreciate  ID consult.    Stage III sacral decubiti: *Cleanse wound to coccyx and sacrum with NS and pat gently dry.  Apply Santyl to wound bed. Cover with NS moist gauze.  Secure with ABD pad and tape.  Change daily.  Turn and reposition every two hours.     Worsening anemia of chronic disease. Transfused 1 unit packed RBC.  * Chronic systolic CHF with ejection fraction of 25-30%. Stable. No signs of fluid overload. The new Lasix, patient had drug-eluting stent in OM in oct 2017. The new Plavix .  SVT: Patient had 8 beat run of V. tach last night but did not  recur Consult cardiology if V. Tach happens again/   * Diabetes mellitus.; Uncontrolled, with the Lantus to 14 units daily at bedtime, insulin NovoLog correction scale to moderate scale.  * DVT prophylaxis with Lovenox.    d/w  patient's wife.   All  the records are reviewed and case discussed with Care Management/Social Workerr. Management plans discussed with the patient, family and they are in agreement.  CODE STATUS: FULL CODE  DVT Prophylaxis: SCDs  TOTAL TIME TAKING CARE OF THIS PATIENT: 35 minutes.   POSSIBLE D/C IN 2-3 DAYS, DEPENDING ON CLINICAL CONDITION.  Epifanio Lesches M.D on 07/11/2016 at 1:36 PM  Between 7am to 6pm - Pager - [phone]  After 6pm go to [email or web address] - password [email or web address] [facility]  Office  [phone]  CC: Primary care physician; Donnie Coffin, MD  Note: This dictation was prepared with Dragon dictation along with smaller phrase technology. Any transcriptional errors that result from this process are unintentional.

## 2016-07-11 NOTE — Consult Note (Signed)
WOC Nurse wound consult note Reason for Consult:Patient from [facility] with unstageable pressure injury to sacrum.  Deep tissue injury to left heel, POA  Scabbed lesion to left foot.  intact Wound type:unstageable pressure injury Pressure Injury POA: Yes Measurement: 4 cm x 4 cm wound bed is 100% slough sacrum  3 cm x 3 cm maroon discoloration to left heel Intact Wound bed:see above Drainage (amount, consistency, odor) moderate serosanguinous drainage with necrotic odor.  Periwound:intact Dressing procedure/placement/frequency:Cleanse wound to coccyx and sacrum with NS and pat gently dry.  Apply Santyl to wound bed. Cover with NS moist gauze.  Secure with ABD pad and tape.  Change daily.  Turn and reposition every two hours.  Prevalon boot to left heel.  Will not follow at this time.  Please re-consult if needed.  Maple Hudson RN BSN CWON Pager [phone]

## 2016-07-11 NOTE — Consult Note (Signed)
[facility] Infectious Disease     Reason for Consult: Sepsis, bactremia  Referring Physician: Suzanne Boron Date of Admission:  07/07/2016   Principal Problem:   Sepsis (HCC) Active Problems:   Coronary artery disease   Essential hypertension   HLD (hyperlipidemia)   Diabetes mellitus type 2 with complications (HCC)   Chronic systolic heart failure (HCC)   PAD (peripheral artery disease) (HCC)   Ulcer of left foot (HCC)   HPI: Damon [name] is a 66 y.o. male with DM, PVD,  CAD, CHF admitted with fevers and sepsis. He had on admit wbc of 14 and temp 102. BCX + clostridium. He has had recurrent high spiking fevers. He is a poor historian but denies any pain in his limbs, or abd. He denies ST, difficult swallowing. He does have a decub ulcer and chronic wound of L foot. Has R BKA recently but this is healed.   Past Medical History:  Diagnosis Date  . CAD (coronary artery disease)    a. 01/2016 MI/PCI: LM nl, LAD 159m/d (3.25 x 28 Xience DES), 40d, LCX 30ost, OM1 95 (staged - 2.75 x 18 Xience Alpine DES), OM2/3 min irregs, RCA 33m.  . Chronic combined systolic and diastolic CHF (congestive heart failure) (HCC)    a. 01/2016 Echo: Ef 25-30%, Gr1 DD, mild AI, mildly dil LA;  b. 02/2016 Echo: EF 30-35%, apical AK, antsept and ant HK, nl RV fxn.  . CKD (chronic kidney disease), stage III   . COPD (chronic obstructive pulmonary disease) (HCC)   . Diabetes mellitus without complication (HCC)   . Diverticulitis   . Essential hypertension   . Ischemic cardiomyopathy    a. 01/2016 Echo: Ef 25-30%;  b. 02/2016 Echo: EF 30-35%.  Marland Kitchen LBBB (left bundle branch block)   . PAD (peripheral artery disease) (HCC)    a. 10/2015 s/p PTA and stenting of the RCIA, LCIA, and PTA of the LEIA (Schnier); b. 04/2016 PTA or R tibioperoneal trunk & prox PT, PTA/DBA of R Pop and SFA, Viabahn covered stent x 2 to the R SFA and pop (83mm x 25 cm & 49mm x 15cm).  . Tobacco abuse    Past Surgical History:  Procedure  Laterality Date  . AMPUTATION Right 06/06/2016   Procedure: AMPUTATION BELOW KNEE;  Surgeon: Annice Needy, MD;  Location: [facility] ORS;  Service: General;  Laterality: Right;  . CARDIAC CATHETERIZATION N/A 01/13/2016   Procedure: LEFT HEART CATH AND CORONARY ANGIOGRAPHY;  Surgeon: Iran Ouch, MD;  Location: [facility] INVASIVE CV LAB;  Service: Cardiovascular;  Laterality: N/A;  . CARDIAC CATHETERIZATION N/A 01/13/2016   Procedure: Coronary Stent Intervention;  Surgeon: Iran Ouch, MD;  Location: [facility] INVASIVE CV LAB;  Service: Cardiovascular;  Laterality: N/A;  . CARDIAC CATHETERIZATION N/A 01/16/2016   Procedure: Left Heart Cath and Coronary Angiography;  Surgeon: Iran Ouch, MD;  Location: [facility] INVASIVE CV LAB;  Service: Cardiovascular;  Laterality: N/A;  . IRRIGATION AND DEBRIDEMENT FOOT Right 05/08/2016   Procedure: IRRIGATION AND DEBRIDEMENT FOOT WITH PLACEMENT OF ANTIBIOTIC BEADS;  Surgeon: Linus Galas, DPM;  Location: [facility] ORS;  Service: Podiatry;  Laterality: Right;  . NECK SURGERY    . PERIPHERAL VASCULAR CATHETERIZATION Left 11/01/2015   Procedure: Lower Extremity Angiography;  Surgeon: Renford Dills, MD;  Location: [facility] INVASIVE CV LAB;  Service: Cardiovascular;  Laterality: Left;  . PERIPHERAL VASCULAR CATHETERIZATION Right 05/07/2016   Procedure: Lower Extremity Angiography;  Surgeon: Annice Needy, MD;  Location: [facility]  INVASIVE CV LAB;  Service: Cardiovascular;  Laterality: Right;   Social History  Substance Use Topics  . Smoking status: Current Every Day Smoker    Packs/day: 0.50    Years: 15.00    Types: Cigarettes  . Smokeless tobacco: Current User  . Alcohol use No   Family History  Problem Relation Age of Onset  . Intracerebral hemorrhage Mother   . Diabetes Mother   . Cancer Mother   . Diabetes Father     Allergies: No Known Allergies  Current antibiotics: Antibiotics Given (last 72 hours)    Date/Time Action Medication Dose Rate   07/08/16 1521 Given    piperacillin-tazobactam (ZOSYN) IVPB 3.375 g 3.375 g 12.5 mL/hr   07/08/16 2032 Given   piperacillin-tazobactam (ZOSYN) IVPB 3.375 g 3.375 g 12.5 mL/hr   07/08/16 2039 Given   vancomycin (VANCOCIN) 750 mg in dextrose 5 % 150 mL IVPB 750 mg 75 mL/hr   07/09/16 0530 Given   piperacillin-tazobactam (ZOSYN) IVPB 3.375 g 3.375 g 12.5 mL/hr   07/09/16 1449 Given   piperacillin-tazobactam (ZOSYN) IVPB 3.375 g 3.375 g 12.5 mL/hr   07/09/16 1612 Given   vancomycin (VANCOCIN) 750 mg in dextrose 5 % 150 mL IVPB 750 mg 75 mL/hr   07/09/16 2311 Given   piperacillin-tazobactam (ZOSYN) 3.375 g in dextrose 5 % 50 mL IVPB 3.375 g 12.5 mL/hr   07/10/16 0506 Given   piperacillin-tazobactam (ZOSYN) 3.375 g in dextrose 5 % 50 mL IVPB 3.375 g 12.5 mL/hr   07/10/16 0908 Given   vancomycin (VANCOCIN) 750 mg in dextrose 5 % 150 mL IVPB 750 mg 75 mL/hr   07/10/16 1451 Given   piperacillin-tazobactam (ZOSYN) 3.375 g in dextrose 5 % 50 mL IVPB 3.375 g 12.5 mL/hr   07/10/16 2128 Given   piperacillin-tazobactam (ZOSYN) 3.375 g in dextrose 5 % 50 mL IVPB 3.375 g 12.5 mL/hr   07/11/16 0300 Given   vancomycin (VANCOCIN) 750 mg in dextrose 5 % 150 mL IVPB 750 mg 75 mL/hr   07/11/16 0641 Given   piperacillin-tazobactam (ZOSYN) 3.375 g in dextrose 5 % 50 mL IVPB 3.375 g 12.5 mL/hr      MEDICATIONS: . ampicillin-sulbactam (UNASYN) IV  3 g Intravenous Q6H  . atorvastatin  80 mg Oral q1800  . carvedilol  6.25 mg Oral BID WC  . clopidogrel  75 mg Oral Daily  . collagenase   Topical Daily  . enoxaparin (LOVENOX) injection  40 mg Subcutaneous Q24H  . feeding supplement (GLUCERNA SHAKE)  237 mL Oral TID BM  . hydrALAZINE  10 mg Oral Q8H  . insulin aspart  0-15 Units Subcutaneous Q4H  . insulin aspart  0-9 Units Subcutaneous TID AC & HS  . insulin glargine  14 Units Subcutaneous QHS  . pantoprazole  40 mg Oral Daily  . tamsulosin  0.4 mg Oral Daily    Review of Systems - 11 systems reviewed and negative per  HPI   OBJECTIVE: Temp:  [98.6 F (37 C)-102.5 F (39.2 C)] 102.5 F (39.2 C) (04/04 1418) Pulse Rate:  [74-95] 82 (04/04 1418) Resp:  [18-30] 30 (04/04 1418) BP: (97-143)/(43-60) 133/51 (04/04 1418) SpO2:  [93 %-100 %] 100 % (04/04 1418) Weight:  [62.6 kg (138 lb)] 62.6 kg (138 lb) (04/04 0137) Physical Exam  Constitutional: He is awake and interactive but appears chronically ill and older than stated age HENT: anicteric, muddy sclera Mouth/Throat: Oropharynx is clear and dry, poor dentition . No oropharyngeal exudate.  Cardiovascular: Normal  rate, regular rhythm and normal heart sounds.  Pulmonary/Chest: Effort normal and breath sounds normal. No respiratory distress. He has no wheezes.  Abdominal: Soft. Bowel sounds are normal. He exhibits no distension. There is no tenderness.  Lymphadenopathy: He has no cervical adenopathy.  Neurological: He is awake and interactive Skin: sacram with an irregular shaped decub ulcer with some adherent slough but this does not have any bogginess or underlying fluctance. It does not have any drainage or odor.  Psychiatric: He has a normal mood and affect. His behavior is normal.   Wound care note Measurement: 4 cm x 4 cm wound bed is 100% slough sacrum  3 cm x 3 cm maroon discoloration to left heel Intact Wound bed:see above Drainage (amount, consistency, odor) moderate serosanguinous drainage with necrotic odor.  Periwound:intact LABS: Results for orders placed or performed during the hospital encounter of 07/07/16 (from the past 48 hour(s))  Glucose, capillary     Status: Abnormal   Collection Time: 07/09/16  4:16 PM  Result Value Ref Range   Glucose-Capillary 214 (H) 65 - 99 mg/dL  Influenza panel by PCR (type A & B)     Status: None   Collection Time: 07/09/16  5:40 PM  Result Value Ref Range   Influenza A By PCR NEGATIVE NEGATIVE   Influenza B By PCR NEGATIVE NEGATIVE    Comment: (NOTE) The Xpert Xpress Flu assay is intended as an aid  in the diagnosis of  influenza and should not be used as a sole basis for treatment.  This  assay is FDA approved for nasopharyngeal swab specimens only. Nasal  washings and aspirates are unacceptable for Xpert Xpress Flu testing.   Glucose, capillary     Status: Abnormal   Collection Time: 07/09/16  8:15 PM  Result Value Ref Range   Glucose-Capillary 264 (H) 65 - 99 mg/dL  Glucose, capillary     Status: Abnormal   Collection Time: 07/09/16  9:42 PM  Result Value Ref Range   Glucose-Capillary 248 (H) 65 - 99 mg/dL  Creatinine, serum     Status: Abnormal   Collection Time: 07/10/16  4:09 AM  Result Value Ref Range   Creatinine, Ser 1.63 (H) 0.61 - 1.24 mg/dL   GFR calc non Af Amer 43 (L) >60 mL/min   GFR calc Af Amer 50 (L) >60 mL/min    Comment: (NOTE) The eGFR has been calculated using the CKD EPI equation. This calculation has not been validated in all clinical situations. eGFR's persistently <60 mL/min signify possible Chronic Kidney Disease.   Magnesium     Status: None   Collection Time: 07/10/16  4:09 AM  Result Value Ref Range   Magnesium 1.8 1.7 - 2.4 mg/dL  Glucose, capillary     Status: Abnormal   Collection Time: 07/10/16  7:34 AM  Result Value Ref Range   Glucose-Capillary 212 (H) 65 - 99 mg/dL  Glucose, capillary     Status: Abnormal   Collection Time: 07/10/16 11:24 AM  Result Value Ref Range   Glucose-Capillary 283 (H) 65 - 99 mg/dL  Glucose, capillary     Status: Abnormal   Collection Time: 07/10/16  4:21 PM  Result Value Ref Range   Glucose-Capillary 235 (H) 65 - 99 mg/dL  Glucose, capillary     Status: Abnormal   Collection Time: 07/10/16  8:57 PM  Result Value Ref Range   Glucose-Capillary 237 (H) 65 - 99 mg/dL  Vancomycin, trough     Status: None  Collection Time: 07/11/16  2:27 AM  Result Value Ref Range   Vancomycin Tr 19 15 - 20 ug/mL  CBC     Status: Abnormal   Collection Time: 07/11/16  2:27 AM  Result Value Ref Range   WBC 19.1 (H) 3.8 -  10.6 K/uL   RBC 2.35 (L) 4.40 - 5.90 MIL/uL   Hemoglobin 7.0 (L) 13.0 - 18.0 g/dL   HCT 20.9 (L) 40.0 - 52.0 %   MCV 89.1 80.0 - 100.0 fL   MCH 29.9 26.0 - 34.0 pg   MCHC 33.6 32.0 - 36.0 g/dL   RDW 16.5 (H) 11.5 - 14.5 %   Platelets 256 150 - 440 K/uL  Basic metabolic panel     Status: Abnormal   Collection Time: 07/11/16  2:27 AM  Result Value Ref Range   Sodium 133 (L) 135 - 145 mmol/L   Potassium 3.3 (L) 3.5 - 5.1 mmol/L   Chloride 105 101 - 111 mmol/L   CO2 21 (L) 22 - 32 mmol/L   Glucose, Bld 205 (H) 65 - 99 mg/dL   BUN 56 (H) 6 - 20 mg/dL   Creatinine, Ser 1.90 (H) 0.61 - 1.24 mg/dL   Calcium 7.7 (L) 8.9 - 10.3 mg/dL   GFR calc non Af Amer 36 (L) >60 mL/min   GFR calc Af Amer 41 (L) >60 mL/min    Comment: (NOTE) The eGFR has been calculated using the CKD EPI equation. This calculation has not been validated in all clinical situations. eGFR's persistently <60 mL/min signify possible Chronic Kidney Disease.    Anion gap 7 5 - 15  Glucose, capillary     Status: Abnormal   Collection Time: 07/11/16  8:38 AM  Result Value Ref Range   Glucose-Capillary 217 (H) 65 - 99 mg/dL  Glucose, capillary     Status: Abnormal   Collection Time: 07/11/16 11:57 AM  Result Value Ref Range   Glucose-Capillary 317 (H) 65 - 99 mg/dL   No components found for: ESR, C REACTIVE PROTEIN MICRO: Recent Results (from the past 720 hour(s))  Blood Culture (routine x 2)     Status: Abnormal   Collection Time: 07/07/16 10:07 PM  Result Value Ref Range Status   Specimen Description BLOOD LEFT FOREARM  Final   Special Requests   Final    BOTTLES DRAWN AEROBIC AND ANAEROBIC Blood Culture adequate volume   Culture  Setup Time   Final    GRAM POSITIVE RODS ANAEROBIC BOTTLE ONLY CRITICAL VALUE NOTED.  VALUE IS CONSISTENT WITH PREVIOUSLY REPORTED AND CALLED VALUE.    Culture (A)  Final    CLOSTRIDIUM RAMOSUM Standardized susceptibility testing for this organism is not available.    Report Status  07/11/2016 FINAL  Final  Blood Culture (routine x 2)     Status: Abnormal   Collection Time: 07/07/16 10:07 PM  Result Value Ref Range Status   Specimen Description BLOOD RIGHT HAND  Final   Special Requests   Final    BOTTLES DRAWN AEROBIC AND ANAEROBIC Blood Culture adequate volume   Culture  Setup Time   Final    Organism ID to follow GRAM POSITIVE RODS ANAEROBIC BOTTLE ONLY CRITICAL RESULT CALLED TO, READ BACK BY AND VERIFIED WITH: NATE COOKSON AT 2046 07/08/16.SDR/PMH    Culture (A)  Final    CLOSTRIDIUM RAMOSUM Standardized susceptibility testing for this organism is not available. Performed at [facility], [place] [address], [place], [place] [address]  Report Status 07/11/2016 FINAL  Final  Blood Culture ID Panel (Reflexed)     Status: None   Collection Time: 07/07/16 10:07 PM  Result Value Ref Range Status   Enterococcus species NOT DETECTED NOT DETECTED Final   Listeria monocytogenes NOT DETECTED NOT DETECTED Final   Staphylococcus species NOT DETECTED NOT DETECTED Final   Staphylococcus aureus NOT DETECTED NOT DETECTED Final   Streptococcus species NOT DETECTED NOT DETECTED Final   Streptococcus agalactiae NOT DETECTED NOT DETECTED Final   Streptococcus pneumoniae NOT DETECTED NOT DETECTED Final   Streptococcus pyogenes NOT DETECTED NOT DETECTED Final   Acinetobacter baumannii NOT DETECTED NOT DETECTED Final   Enterobacteriaceae species NOT DETECTED NOT DETECTED Final   Enterobacter cloacae complex NOT DETECTED NOT DETECTED Final   Escherichia coli NOT DETECTED NOT DETECTED Final   Klebsiella oxytoca NOT DETECTED NOT DETECTED Final   Klebsiella pneumoniae NOT DETECTED NOT DETECTED Final   Proteus species NOT DETECTED NOT DETECTED Final   Serratia marcescens NOT DETECTED NOT DETECTED Final   Haemophilus influenzae NOT DETECTED NOT DETECTED Final   Neisseria meningitidis NOT DETECTED NOT DETECTED Final   Pseudomonas aeruginosa NOT DETECTED NOT DETECTED Final    Candida albicans NOT DETECTED NOT DETECTED Final   Candida glabrata NOT DETECTED NOT DETECTED Final   Candida krusei NOT DETECTED NOT DETECTED Final   Candida parapsilosis NOT DETECTED NOT DETECTED Final   Candida tropicalis NOT DETECTED NOT DETECTED Final    IMAGING: Dg Chest 1 View  Result Date: 07/08/2016 CLINICAL DATA:  Dyspnea EXAM: CHEST 1 VIEW COMPARISON:  07/07/2016 FINDINGS: The heart size and mediastinal contours are within normal limits. Aortic atherosclerosis. ACDF of the visualized lower cervical spine. Both lungs are clear. The visualized skeletal structures are unremarkable. IMPRESSION: No active disease.  Aortic atherosclerosis.  No significant change. Electronically Signed   By: Ashley Royalty M.D.   On: 07/08/2016 20:42   Ct Chest Wo Contrast  Result Date: 07/09/2016 CLINICAL DATA:  Sepsis.  New shortness of breath. EXAM: CT CHEST WITHOUT CONTRAST TECHNIQUE: Multidetector CT imaging of the chest was performed following the standard protocol without IV contrast. COMPARISON:  Chest x-ray from yesterday FINDINGS: Cardiovascular: Borderline cardiomegaly. Minimal anterior pericardial fluid. Atherosclerosis, including the coronary arteries. Left coronary stenting. No acute vascular finding. Mediastinum/Nodes: Negative for mass or adenopathy. Lungs/Pleura: Motion degraded. Minor dependent atelectasis. No pulmonary edema or consolidation. Generalized airway thickening, likely related patient's smoking status. Upper Abdomen: No acute finding Musculoskeletal: No acute finding. Degenerative appearing endplate irregularities with spurring. Symmetric gynecomastia. IMPRESSION: Negative for failure or pneumonia.  Mild dependent atelectasis. Electronically Signed   By: Monte Fantasia M.D.   On: 07/09/2016 15:55   Dg Chest Port 1 View  Result Date: 07/07/2016 CLINICAL DATA:  Hyperglycemia EXAM: PORTABLE CHEST 1 VIEW COMPARISON:  06/11/2016 chest radiograph. FINDINGS: Surgical hardware overlies the  lower cervical spine. Stable cardiomediastinal silhouette with normal heart size and aortic atherosclerosis. No pneumothorax. No pleural effusion. Lungs appear clear, with no acute consolidative airspace disease and no pulmonary edema. IMPRESSION: No active disease. Aortic atherosclerosis. Electronically Signed   By: Ilona Sorrel M.D.   On: 07/07/2016 22:52    Assessment:   KALED ALLENDE is a 66 y.o. male with clostridial sepsis of unclear source. He has a relatively uninfected appearing sacral ulcer and his foot wounds do not appear to be overtly infected. Likely source of this clostridial sepsis is bowel, but could also be from his sacral wound. Needs to have abdominal  imaging.   Recommendations Can dc vanco Change zosyn to unasyn. Would do CT abd pelvis but given CKD can start without contrast Thank you very much for allowing me to participate in the care of this patient. Please call with questions.   Cheral Marker. Ola Spurr, MD

## 2016-07-11 NOTE — Progress Notes (Signed)
Inpatient Diabetes Program Recommendations  AACE/ADA: New Consensus Statement on Inpatient Glycemic Control (2015)  Target Ranges:  Prepandial:   less than 140 mg/dL      [facility] postprandial:   less than 180 mg/dL (1-2 hours)      Critically ill patients:  140 - 180 mg/dL   Results for Damon [name], Damon [name] (MRN [id number]) as of 07/11/2016 09:58  Ref. Range 07/10/2016 07:34 07/10/2016 11:24 07/10/2016 16:21 07/10/2016 20:57  Glucose-Capillary Latest Ref Range: 65 - 99 mg/dL 409 (H) 811 (H) 914 (H) 237 (H)   Results for Damon [name], Damon [name] (MRN [id number]) as of 07/11/2016 09:58  Ref. Range 07/11/2016 08:38  Glucose-Capillary Latest Ref Range: 65 - 99 mg/dL 086 (H)    Home DM Meds: Lantus 14 units QHS                             Metformin 500 mg BID  Current Insulin Orders: Novolog Sensitive Correction Scale/ SSI (0-9 units) TID AC + HS      Lantus 10 units QHS      MD- Please consider the following in-hospital insulin adjustments:  1. Increase Lantus to 14 units QHS (home dose)  2. Increase Novolog Correction Scale/ SSI to Moderate scale (0-15 units) TID AC + HS     --Will follow patient during hospitalization--  Ambrose Finland RN, MSN, CDE Diabetes Coordinator Inpatient Glycemic Control Team Team Pager: [phone] (8a-5p)

## 2016-07-11 NOTE — Clinical Social Work Note (Signed)
CSW received phone call from [facility] [place] who spoke to [facility] insurance company and they said patient's insurance ended on 07-07-16.  [facility] is unable to accept patient back unless he has different insurance or can private pay.  CSW to continue to follow patient's progress and determine if patient has other insurance.  Damon [name] Carollee Nussbaumer, MSW, [facility]ors [phone]  07/11/2016 5:40 PM

## 2016-07-11 NOTE — Care Management (Signed)
Sent communication to Pre Service Dept because it is currently listed that he does not have any insurance. Informed he has [employer]. Presents from [place]

## 2016-07-11 NOTE — Clinical Social Work Note (Addendum)
CSW was informed that patient is from [facility] as a short term care resident.  Plan is for patient to return to [facility] once he is medically ready for discharge and orders have been received.  CSW continuing to follow patient's progress throughout discharge planning.    4:45pm CSW contacted [facility] patient does have [name] insurance and they will start insurance authorization.  Ervin Knack. Jamauri Kruzel, MSW, [facility] [phone]  07/11/2016 12:01 PM

## 2016-07-11 NOTE — Progress Notes (Signed)
Pt transferred from 1A. No SOB, no c/o pain. Telemetry and skin verified. VSS, no concerns offered at this time.

## 2016-07-11 NOTE — Evaluation (Signed)
Physical Therapy Evaluation Patient Details Name: Damon [name] MRN: [id number] DOB: 1951/03/24 Today's Date: 07/11/2016   History of Present Illness  pt is a 66 yo male who came to [facility] from [facility] on 07/08/16 w/ fever and hyperglycemia, and non-infeted ulcers on the L foot that was assessed by Podiatry (07/08/16). Diagnosed w/ sepsis due to unsepecified organism. 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 PMH includes; CAD, CHF, CKD, COPD, DM, HTN, PAD, ischemic cardiomyopathy, LBBB, and diverticulosis     Clinical Impression  Pt awake and oriented and seemed down with a flat affect. Dr. Ether Griffins was called by nursing in regards to [facility] status and stated patient able to participate in [facility] as long as he wears an Wellsite geologist.Pt had no complaints of pain but demonstrates increased work of breathing at rest that increases with activity. He displayed decreased strength throughout more notably in his B LE's grossly 3-/5 with decreased muscular endurance, patient displayed fatigue with basic supine therex. Pt is max assist for bed mobility and displays poor sitting posture and is only able to sit independently w/ UE support for 10-20 seconds before requiring mod assistance from PT. Overall patient displays significant strength deficits, and poor activity tolerance and requires significant assistance to perform basic mobility tasks, pt will benefit from skilled PT to correct deficits and increase functional mobility, recommend he return to STR when medically appropriate.       Follow Up Recommendations SNF (Return to [facility])    Equipment Recommendations       Recommendations for Other Services       Precautions / Restrictions Precautions Precautions: Fall Required Braces or Orthoses: Other Brace/Splint Other Brace/Splint: Ortho wedge on the L Foot during weightbearing  Restrictions Weight Bearing Restrictions: No      Mobility  Bed Mobility Overal bed mobility: Needs  Assistance Bed Mobility: Supine to Sit;Sit to Supine     Supine to sit: Max assist Sit to supine: Max assist   General bed mobility comments: pt max assist to get to sitting can somewhat participate with use of UE but requires extensive assistance moving B LE's and bringing trunk into upright position   Transfers                 General transfer comment: not assessed pt required mod assist to maintain sitting balance  Ambulation/Gait                Stairs            Wheelchair Mobility    Modified Rankin (Stroke Patients Only)       Balance Overall balance assessment: Needs assistance Sitting-balance support: Feet supported;Bilateral upper extremity supported Sitting balance-Leahy Scale: Poor Sitting balance - Comments: only able to maintain sitting posture for short bouts of 10-20 seconds and then requires mod to max assist, increased WOB in sitting  Postural control: Posterior lean;Left lateral lean                                   Pertinent Vitals/Pain Pain Assessment: No/denies pain    Home Living Family/patient expects to be discharged to:: Skilled nursing facility                      Prior Function Level of Independence: Needs assistance   Gait / Transfers Assistance Needed: pt states he was able to  transfer to and from wheelchair w/ assistance at SNF           Hand Dominance   Dominant Hand: Right    Extremity/Trunk Assessment   Upper Extremity Assessment Upper Extremity Assessment: Generalized weakness (grossly 4/5)    Lower Extremity Assessment Lower Extremity Assessment: Generalized weakness;LLE deficits/detail;RLE deficits/detail (grossly 3-/5, decreased endurance ) RLE Deficits / Details: R BKA, decreased hip and knee strength LLE Deficits / Details: diminished sensation on dorsal aspect of L Foot, no sensation on the plantar surface of L foot, decresed strength throughout  LLE Sensation: decreased  light touch       Communication   Communication: No difficulties  Cognition Arousal/Alertness: Awake/alert Behavior During Therapy: Flat affect Overall Cognitive Status: Within Functional Limits for tasks assessed                                 General Comments: pt able to follow commands, was annoyed wanting a prosthetic for his R LE      General Comments      Exercises Total Joint Exercises Hip ABduction/ADduction: AAROM;Strengthening;Both;10 reps;Supine (requries min assist to complete, muscles become fatigued ) Straight Leg Raises: AAROM;Strengthening;Both;10 reps;Supine (limited by fatigue required min assist to complete reps) Long Arc Quad: AROM;Strengthening;Left;10 reps;Seated (displays weakness and unable to achieve full ROM) Other Exercises Other Exercises: seated reaching exercises to promote trunk control and improved posture in sitting, 1x5' mod assist to maintain balance and pt fearful of leaning forward,   Assessment/Plan    PT Assessment Patient needs continued PT services  PT Problem List Decreased strength;Decreased range of motion;Decreased activity tolerance;Decreased balance;Decreased mobility;Decreased knowledge of use of DME;Cardiopulmonary status limiting activity;Impaired sensation       PT Treatment Interventions DME instruction;Gait training    PT Goals (Current goals can be found in the Care Plan section)  Acute Rehab PT Goals Patient Stated Goal: To get stronger  PT Goal Formulation: With patient Time For Goal Achievement: 07/25/16 Potential to Achieve Goals: Fair    Frequency Min 2X/week   Barriers to discharge Decreased caregiver support;Inaccessible home environment pt currently unable to maneuver his home environment and wife is unable to provide adequate assistance    Co-evaluation               End of Session Equipment Utilized During Treatment: Other (comment) (ortho wedge boot) Activity Tolerance: Patient  limited by fatigue Patient left: in bed;with call bell/phone within reach;with bed alarm set;Other (comment) (Unna boot applied) Nurse Communication: Mobility status PT Visit Diagnosis: Unsteadiness on feet (R26.81);Muscle weakness (generalized) (M62.81)    Time: 1610-9604 PT Time Calculation (min) (ACUTE ONLY): 25 min   Charges:         PT G Codes:        Farrah Skoda Student PT 07/11/16, 3:08 PM [phone]   Lynanne Delgreco 07/11/2016, 3:02 PM

## 2016-07-12 ENCOUNTER — Encounter
Admission: EM | Disposition: A | Discharge status: Skilled Nursing Facility | Payer: Self-pay | Source: Home / Self Care | Attending: Internal Medicine

## 2016-07-12 ENCOUNTER — Inpatient Hospital Stay: Payer: Commercial Managed Care - PPO | Admitting: Anesthesiology

## 2016-07-12 DIAGNOSIS — B967 Clostridium perfringens [C. perfringens] as the cause of diseases classified elsewhere: Secondary | ICD-10-CM

## 2016-07-12 DIAGNOSIS — M726 Necrotizing fasciitis: Secondary | ICD-10-CM

## 2016-07-12 DIAGNOSIS — L0231 Cutaneous abscess of buttock: Secondary | ICD-10-CM

## 2016-07-12 DIAGNOSIS — L89304 Pressure ulcer of unspecified buttock, stage 4: Secondary | ICD-10-CM

## 2016-07-12 DIAGNOSIS — M7989 Other specified soft tissue disorders: Secondary | ICD-10-CM

## 2016-07-12 DIAGNOSIS — Z89511 Acquired absence of right leg below knee: Secondary | ICD-10-CM

## 2016-07-12 DIAGNOSIS — A408 Other streptococcal sepsis: Secondary | ICD-10-CM

## 2016-07-12 HISTORY — PX: INCISION AND DRAINAGE ABSCESS: SHX5864

## 2016-07-12 LAB — BASIC METABOLIC PANEL
ANION GAP: 9 (ref 5–15)
BUN: 70 mg/dL — AB (ref 6–20)
CALCIUM: 7.8 mg/dL — AB (ref 8.9–10.3)
CO2: 20 mmol/L — AB (ref 22–32)
CREATININE: 2.54 mg/dL — AB (ref 0.61–1.24)
Chloride: 105 mmol/L (ref 101–111)
GFR calc Af Amer: 29 mL/min — ABNORMAL LOW (ref >60)
GFR, EST NON AFRICAN AMERICAN: 25 mL/min — AB (ref >60)
GLUCOSE: 121 mg/dL — AB (ref 65–99)
Potassium: 3.1 mmol/L — ABNORMAL LOW (ref 3.5–5.1)
Sodium: 134 mmol/L — ABNORMAL LOW (ref 135–145)

## 2016-07-12 LAB — GLUCOSE, CAPILLARY
GLUCOSE-CAPILLARY: 110 mg/dL — AB (ref 65–99)
GLUCOSE-CAPILLARY: 120 mg/dL — AB (ref 65–99)
GLUCOSE-CAPILLARY: 162 mg/dL — AB (ref 65–99)
GLUCOSE-CAPILLARY: 61 mg/dL — AB (ref 65–99)
GLUCOSE-CAPILLARY: 72 mg/dL (ref 65–99)
GLUCOSE-CAPILLARY: 86 mg/dL (ref 65–99)
Glucose-Capillary: 109 mg/dL — ABNORMAL HIGH (ref 65–99)
Glucose-Capillary: 120 mg/dL — ABNORMAL HIGH (ref 65–99)
Glucose-Capillary: 123 mg/dL — ABNORMAL HIGH (ref 65–99)

## 2016-07-12 LAB — CBC WITH DIFFERENTIAL/PLATELET
BASOS ABS: 0 10*3/uL (ref 0–0.1)
BASOS PCT: 0 %
EOS ABS: 0 10*3/uL (ref 0–0.7)
EOS PCT: 0 %
HEMATOCRIT: 22.3 % — AB (ref 40.0–52.0)
Hemoglobin: 7.4 g/dL — ABNORMAL LOW (ref 13.0–18.0)
Lymphocytes Relative: 5 %
Lymphs Abs: 0.7 10*3/uL — ABNORMAL LOW (ref 1.0–3.6)
MCH: 29.4 pg (ref 26.0–34.0)
MCHC: 33 g/dL (ref 32.0–36.0)
MCV: 89.2 fL (ref 80.0–100.0)
MONO ABS: 0.7 10*3/uL (ref 0.2–1.0)
MONOS PCT: 5 %
Neutro Abs: 14.7 10*3/uL — ABNORMAL HIGH (ref 1.4–6.5)
Neutrophils Relative %: 90 %
PLATELETS: 261 10*3/uL (ref 150–440)
RBC: 2.5 MIL/uL — ABNORMAL LOW (ref 4.40–5.90)
RDW: 16.5 % — AB (ref 11.5–14.5)
WBC: 16.2 10*3/uL — ABNORMAL HIGH (ref 3.8–10.6)

## 2016-07-12 LAB — MRSA PCR SCREENING: MRSA by PCR: NEGATIVE

## 2016-07-12 SURGERY — INCISION AND DRAINAGE, ABSCESS
Anesthesia: General

## 2016-07-12 MED ORDER — ETOMIDATE 2 MG/ML IV SOLN
INTRAVENOUS | Status: AC
  Filled 2016-07-12: qty 10

## 2016-07-12 MED ORDER — INSULIN GLARGINE 100 UNIT/ML ~~LOC~~ SOLN
12.0000 [IU] | Freq: Every day | SUBCUTANEOUS | Status: DC
  Administered 2016-07-13: 12 [IU] via SUBCUTANEOUS
  Filled 2016-07-12 (×2): qty 0.12

## 2016-07-12 MED ORDER — SODIUM CHLORIDE 0.9 % IV SOLN
INTRAVENOUS | Status: DC | PRN
  Administered 2016-07-12: 22:00:00 via INTRAVENOUS

## 2016-07-12 MED ORDER — DEXTROSE-NACL 5-0.9 % IV SOLN
INTRAVENOUS | Status: DC
  Administered 2016-07-12: 12:00:00 via INTRAVENOUS

## 2016-07-12 MED ORDER — ENSURE ENLIVE PO LIQD
237.0000 mL | Freq: Two times a day (BID) | ORAL | Status: DC
  Administered 2016-07-13 – 2016-07-17 (×8): 237 mL via ORAL

## 2016-07-12 MED ORDER — AMPICILLIN-SULBACTAM SODIUM 3 (2-1) G IJ SOLR
3.0000 g | Freq: Two times a day (BID) | INTRAMUSCULAR | Status: DC
  Administered 2016-07-12: 3 g via INTRAVENOUS
  Filled 2016-07-12 (×2): qty 3

## 2016-07-12 MED ORDER — ETOMIDATE 2 MG/ML IV SOLN
INTRAVENOUS | Status: DC | PRN
  Administered 2016-07-12: 16 mg via INTRAVENOUS

## 2016-07-12 MED ORDER — PIPERACILLIN-TAZOBACTAM 3.375 G IVPB 30 MIN
3.3750 g | Freq: Three times a day (TID) | INTRAVENOUS | Status: DC
  Administered 2016-07-12 – 2016-07-14 (×5): 3.375 g via INTRAVENOUS
  Filled 2016-07-12 (×8): qty 50

## 2016-07-12 MED ORDER — FENTANYL CITRATE (PF) 100 MCG/2ML IJ SOLN
INTRAMUSCULAR | Status: DC | PRN
  Administered 2016-07-12 (×2): 25 ug via INTRAVENOUS

## 2016-07-12 MED ORDER — PROPOFOL 10 MG/ML IV BOLUS
INTRAVENOUS | Status: AC
  Filled 2016-07-12: qty 20

## 2016-07-12 MED ORDER — SUCCINYLCHOLINE CHLORIDE 20 MG/ML IJ SOLN
INTRAMUSCULAR | Status: DC | PRN
  Administered 2016-07-12: 120 mg via INTRAVENOUS

## 2016-07-12 MED ORDER — CLINDAMYCIN PHOSPHATE 600 MG/50ML IV SOLN
INTRAVENOUS | Status: DC | PRN
  Administered 2016-07-12: 600 mg via INTRAVENOUS

## 2016-07-12 MED ORDER — SUCCINYLCHOLINE CHLORIDE 20 MG/ML IJ SOLN
INTRAMUSCULAR | Status: AC
  Filled 2016-07-12: qty 1

## 2016-07-12 MED ORDER — PHENYLEPHRINE HCL 10 MG/ML IJ SOLN
INTRAMUSCULAR | Status: DC | PRN
  Administered 2016-07-12 (×4): 100 ug via INTRAVENOUS
  Administered 2016-07-12: 150 ug via INTRAVENOUS

## 2016-07-12 MED ORDER — ROCURONIUM BROMIDE 100 MG/10ML IV SOLN
INTRAVENOUS | Status: DC | PRN
  Administered 2016-07-12: 10 mg via INTRAVENOUS

## 2016-07-12 MED ORDER — ONDANSETRON HCL 4 MG/2ML IJ SOLN: 4.0000 mg | Freq: Once | INTRAMUSCULAR | Status: DC | PRN

## 2016-07-12 MED ORDER — VANCOMYCIN HCL 1000 MG IV SOLR
INTRAVENOUS | Status: DC | PRN
  Administered 2016-07-12: 1000 mg via INTRAVENOUS

## 2016-07-12 MED ORDER — FENTANYL CITRATE (PF) 100 MCG/2ML IJ SOLN: 25.0000 ug | INTRAMUSCULAR | Status: DC | PRN

## 2016-07-12 MED ORDER — FENTANYL CITRATE (PF) 100 MCG/2ML IJ SOLN
INTRAMUSCULAR | Status: AC
  Filled 2016-07-12: qty 2

## 2016-07-12 MED ORDER — VANCOMYCIN HCL 1000 MG IV SOLR
INTRAVENOUS | Status: AC
  Filled 2016-07-12: qty 1000

## 2016-07-12 MED ORDER — ROCURONIUM BROMIDE 50 MG/5ML IV SOLN
INTRAVENOUS | Status: AC
  Filled 2016-07-12: qty 1

## 2016-07-12 MED ORDER — LIDOCAINE HCL (PF) 2 % IJ SOLN
INTRAMUSCULAR | Status: AC
  Filled 2016-07-12: qty 2

## 2016-07-12 MED ORDER — CLINDAMYCIN PHOSPHATE 600 MG/50ML IV SOLN
INTRAVENOUS | Status: AC
  Filled 2016-07-12: qty 50

## 2016-07-12 SURGICAL SUPPLY — 22 items
BLADE CLIPPER SURG (BLADE) ×1 IMPLANT
BLADE SURG 15 STRL LF DISP TIS (BLADE) ×1 IMPLANT
BLADE SURG 15 STRL SS (BLADE) ×3
BNDG GAUZE 4.5X4.1 6PLY STRL (MISCELLANEOUS) ×4 IMPLANT
BRIEF STRETCH MATERNITY 2XLG (MISCELLANEOUS) ×2 IMPLANT
CANISTER SUCT 3000ML (MISCELLANEOUS) ×1 IMPLANT
CHLORAPREP W/TINT 26ML (MISCELLANEOUS) ×2 IMPLANT
DRAPE LAPAROTOMY T 102X78X121 (DRAPES) ×2 IMPLANT
ELECT REM PT RETURN 9FT ADLT (ELECTROSURGICAL) ×3
ELECTRODE REM PT RTRN 9FT ADLT (ELECTROSURGICAL) ×1 IMPLANT
GAUZE SPONGE 4X4 12PLY STRL (GAUZE/BANDAGES/DRESSINGS) ×2 IMPLANT
GLOVE BIO SURGEON STRL SZ7 (GLOVE) ×9 IMPLANT
GOWN STRL REUS W/ TWL LRG LVL3 (GOWN DISPOSABLE) ×2 IMPLANT
GOWN STRL REUS W/TWL LRG LVL3 (GOWN DISPOSABLE) ×6
NEEDLE HYPO 22GX1.5 SAFETY (NEEDLE) ×1 IMPLANT
NS IRRIG 1000ML POUR BTL (IV SOLUTION) ×3 IMPLANT
PACK BASIN MINOR ARMC (MISCELLANEOUS) ×3 IMPLANT
SCRUB POVIDONE IODINE 4 OZ (MISCELLANEOUS) ×1 IMPLANT
SOL PREP PVP 2OZ (MISCELLANEOUS)
SOLUTION PREP PVP 2OZ (MISCELLANEOUS) ×1 IMPLANT
SPONGE LAP 18X18 5 PK (GAUZE/BANDAGES/DRESSINGS) ×5 IMPLANT
SWAB CULTURE AMIES ANAERIB BLU (MISCELLANEOUS) ×4 IMPLANT

## 2016-07-12 NOTE — Consult Note (Signed)
Patient ID: Damon [name], male   DOB: 10-08-1950, 66 y.o.   MRN: [id number]  CC: Fevers  HPI Damon [name] is a 66 y.o. male who is currently admitted to the medicine service. Surgery consult requested by Dr.Sudini for evaluation of a gluteal abscess. Patient is a very poor historian. He denies any pain. Per nursing staff he has been febrile. Patient denies any active complaints. He denies any chest pain, shortness of breath, cough, nausea, vomiting, diarrhea, constipation. Surgery consult was requested after CT scan showed gas tracking from a decubitus ulcer consistent with a gluteal abscess. Per the primary doctor patient has been growing a Clostridium bacteria from his blood cultures drawn 5 days ago.  HPI  Past Medical History:  Diagnosis Date  . CAD (coronary artery disease)    a. 01/2016 MI/PCI: LM nl, LAD 140m/d (3.25 x 28 Xience DES), 40d, LCX 30ost, OM1 95 (staged - 2.75 x 18 Xience Alpine DES), OM2/3 min irregs, RCA 93m.  . Chronic combined systolic and diastolic CHF (congestive heart failure) (HCC)    a. 01/2016 Echo: Ef 25-30%, Gr1 DD, mild AI, mildly dil LA;  b. 02/2016 Echo: EF 30-35%, apical AK, antsept and ant HK, nl RV fxn.  . CKD (chronic kidney disease), stage III   . COPD (chronic obstructive pulmonary disease) (HCC)   . Diabetes mellitus without complication (HCC)   . Diverticulitis   . Essential hypertension   . Ischemic cardiomyopathy    a. 01/2016 Echo: Ef 25-30%;  b. 02/2016 Echo: EF 30-35%.  Marland Kitchen LBBB (left bundle branch block)   . PAD (peripheral artery disease) (HCC)    a. 10/2015 s/p PTA and stenting of the RCIA, LCIA, and PTA of the LEIA (Schnier); b. 04/2016 PTA or R tibioperoneal trunk & prox PT, PTA/DBA of R Pop and SFA, Viabahn covered stent x 2 to the R SFA and pop (5mm x 25 cm & 5mm x 15cm).  . Tobacco abuse     Past Surgical History:  Procedure Laterality Date  . AMPUTATION Right 06/06/2016   Procedure: AMPUTATION BELOW KNEE;  Surgeon: Annice Needy, MD;   Location: [facility] ORS;  Service: General;  Laterality: Right;  . CARDIAC CATHETERIZATION N/A 01/13/2016   Procedure: LEFT HEART CATH AND CORONARY ANGIOGRAPHY;  Surgeon: Iran Ouch, MD;  Location: [facility] INVASIVE CV LAB;  Service: Cardiovascular;  Laterality: N/A;  . CARDIAC CATHETERIZATION N/A 01/13/2016   Procedure: Coronary Stent Intervention;  Surgeon: Iran Ouch, MD;  Location: [facility] INVASIVE CV LAB;  Service: Cardiovascular;  Laterality: N/A;  . CARDIAC CATHETERIZATION N/A 01/16/2016   Procedure: Left Heart Cath and Coronary Angiography;  Surgeon: Iran Ouch, MD;  Location: [facility] INVASIVE CV LAB;  Service: Cardiovascular;  Laterality: N/A;  . IRRIGATION AND DEBRIDEMENT FOOT Right 05/08/2016   Procedure: IRRIGATION AND DEBRIDEMENT FOOT WITH PLACEMENT OF ANTIBIOTIC BEADS;  Surgeon: Linus Galas, DPM;  Location: [facility] ORS;  Service: Podiatry;  Laterality: Right;  . NECK SURGERY    . PERIPHERAL VASCULAR CATHETERIZATION Left 11/01/2015   Procedure: Lower Extremity Angiography;  Surgeon: Renford Dills, MD;  Location: [facility] INVASIVE CV LAB;  Service: Cardiovascular;  Laterality: Left;  . PERIPHERAL VASCULAR CATHETERIZATION Right 05/07/2016   Procedure: Lower Extremity Angiography;  Surgeon: Annice Needy, MD;  Location: [facility] INVASIVE CV LAB;  Service: Cardiovascular;  Laterality: Right;    Family History  Problem Relation Age of Onset  . Intracerebral hemorrhage Mother   . Diabetes Mother   . Cancer  Mother   . Diabetes Father     Social History Social History  Substance Use Topics  . Smoking status: Current Every Day Smoker    Packs/day: 0.50    Years: 15.00    Types: Cigarettes  . Smokeless tobacco: Current User  . Alcohol use No    No Known Allergies  Current Facility-Administered Medications  Medication Dose Route Frequency Provider Last Rate Last Dose  . acetaminophen (TYLENOL) tablet 650 mg  650 mg Oral Q6H PRN Oralia Manis, MD   650 mg at 07/11/16 1418   Or  .  acetaminophen (TYLENOL) suppository 650 mg  650 mg Rectal Q6H PRN Oralia Manis, MD      . albuterol (PROVENTIL) (2.5 MG/3ML) 0.083% nebulizer solution 2.5 mg  2.5 mg Nebulization Q6H PRN Katha Hamming, MD   2.5 mg at 07/10/16 1217  . Ampicillin-Sulbactam (UNASYN) 3 g in sodium chloride 0.9 % 100 mL IVPB  3 g Intravenous Q12H Jodelle Red [place], [facility]      . atorvastatin (LIPITOR) tablet 80 mg  80 mg Oral q1800 Oralia Manis, MD   80 mg at 07/11/16 1729  . carvedilol (COREG) tablet 6.25 mg  6.25 mg Oral BID WC Katha Hamming, MD   6.25 mg at 07/12/16 0836  . clopidogrel (PLAVIX) tablet 75 mg  75 mg Oral Daily Oralia Manis, MD   75 mg at 07/12/16 0931  . collagenase (SANTYL) ointment   Topical Daily Katha Hamming, MD      . dextrose 5 %-0.9 % sodium chloride infusion   Intravenous Continuous Milagros Loll, MD 50 mL/hr at 07/12/16 1215    . enoxaparin (LOVENOX) injection 40 mg  40 mg Subcutaneous Q24H Oralia Manis, MD   40 mg at 07/12/16 0130  . feeding supplement (ENSURE ENLIVE) (ENSURE ENLIVE) liquid 237 mL  237 mL Oral BID BM Katha Hamming, MD      . hydrALAZINE (APRESOLINE) tablet 10 mg  10 mg Oral Q8H Katha Hamming, MD   10 mg at 07/12/16 1432  . insulin aspart (novoLOG) injection 0-15 Units  0-15 Units Subcutaneous Q4H Katha Hamming, MD   3 Units at 07/12/16 0000  . insulin glargine (LANTUS) injection 12 Units  12 Units Subcutaneous QHS Srikar Sudini, MD      . iopamidol (ISOVUE-300) 61 % injection 30 mL  30 mL Oral Once Mick Sell, MD      . ondansetron [facility]) tablet 4 mg  4 mg Oral Q6H PRN Oralia Manis, MD       Or  . ondansetron [facility]) injection 4 mg  4 mg Intravenous Q6H PRN Oralia Manis, MD      . pantoprazole (PROTONIX) EC tablet 40 mg  40 mg Oral Daily Oralia Manis, MD   40 mg at 07/12/16 0931  . tamsulosin (FLOMAX) capsule 0.4 mg  0.4 mg Oral Daily Oralia Manis, MD   0.4 mg at 07/12/16 1610     Review of Systems A Multipoint review of systems  was asked and was negative except for the findings documented in the history of present illness  Physical Exam Blood pressure (!) 121/49, pulse 79, temperature 99.9 F (37.7 C), temperature source Oral, resp. rate 18, height  (1.676 m), weight 61.4 kg (135 lb 7.6 oz), SpO2 97 %. CONSTITUTIONAL: Resting in bed in no acute distress. EYES: Pupils are equal, round, and reactive to light, Sclera are non-icteric. EARS, NOSE, MOUTH AND THROAT: The oropharynx is clear. The oral mucosa is pink and moist.  Hearing is intact to voice. LYMPH NODES:  Lymph nodes in the neck are normal. RESPIRATORY:  Lungs are clear. There is normal respiratory effort, with equal breath sounds bilaterally, and without pathologic use of accessory muscles. CARDIOVASCULAR: Heart is regular without murmurs, gallops, or rubs. GI: The abdomen is soft, nontender, and nondistended. There are no palpable masses. There is no hepatosplenomegaly. There are normal bowel sounds in all quadrants. GU: Rectal deferred.   MUSCULOSKELETAL: Patient has a right BKA SKIN: With the patient turned on his side and his perineum was able to be fully visualized. A 4 x 4 centimeter sacral decubitus ulcer was able to visualize. Palpating the tissue around this shows induration towards the rectum with some fluctuance to the left of the decubitus ulcer. No obvious drainage. NEUROLOGIC: Motor and sensation is grossly normal. Cranial nerves are grossly intact. PSYCH:  Oriented to person, place and time. Affect is normal.  Data Reviewed Images and labs reviewed. Labs show significant leukocytosis of 16.2, anemia 7. 4/20 2.3, normal platelets at 261. Multiple electrolyte abdomen abnormalities including hypokalemia 3.1, increased creatinine of 2.54, increased BUN of 70. CT scan of the abdomen and pelvis was reviewed which shows the decubitus ulcer as well as gas and fluid collection extending laterally towards the left. I have personally reviewed the  patient's imaging, laboratory findings and medical records.    Assessment    Gluteal abscess    Plan    66 year old male with multiple medical problems and CT evidence of a gluteal abscess in association with a decubitus ulcer. Given the patient has blood cultures positive for Clostridium this is concerning as a possible source. Discussed with the patient the need to go to the operating room for drainage procedure. Should this represent necrotizing fasciitis he will likely require transfer to tertiary care center after stabilizing debridement is performed later today/tonight. Surgery will likely be performed by my partner this evening, Dr. Everlene Farrier due to the current operative load.     Time spent with the patient was 80 minutes, with more than 50% of the time spent in face-to-face education, counseling and care coordination.     Ricarda Frame, MD FACS General Surgeon 07/12/2016, 4:41 PM

## 2016-07-12 NOTE — Progress Notes (Signed)
Nutrition Follow-up  DOCUMENTATION CODES:   Severe malnutrition in context of chronic illness  INTERVENTION:  1. Switch to Ensure Enlive po BID, each supplement provides 350 kcal and 20 grams of protein now that CBGs are under control  NUTRITION DIAGNOSIS:   Malnutrition related to chronic illness as evidenced by moderate depletion of body fat, moderate depletions of muscle mass, percent weight loss. -ongoing   GOAL:   Patient will meet greater than or equal to 90% of their needs -not meeting  MONITOR:   PO intake, I & O's, Labs, Supplement acceptance  REASON FOR ASSESSMENT:   Low Braden    ASSESSMENT:   Damon [name]  is a 66 y.o. male who presents with Fever and hyperglycemia. Patient had 103.1 fever per EMS, here in the ED was 101. Has an elevated white blood cell count, and is tachycardic  PO continues to be poor.   20-50% last 3 meals - unable to recall what he ate No PO for breakfast - NPO currently Ensure will help with protein intake, wound healing. Labs and medications reviewed: Na 133, K 3.3  Diet Order:  Diet NPO time specified  Skin:  Wound (see comment) (Stg III PU to sacrum)  Last BM:  07/08/2016  Height:   Ht Readings from Last 1 Encounters:  07/07/16  (1.676 m)    Weight:   Wt Readings from Last 1 Encounters:  07/12/16 135 lb 7.6 oz (61.4 kg)    Ideal Body Weight:  60.7 kg  BMI:  Body mass index is 21.87 kg/m.  Estimated Nutritional Needs:   Kcal:  1850-2150 calories  Protein:  80-92 gm  Fluid:  >/= 1.9L  EDUCATION NEEDS:   No education needs identified at this time  [name]. Vung Kush, MS, RD LDN Inpatient Clinical Dietitian Pager [phone]

## 2016-07-12 NOTE — Progress Notes (Signed)
Report given to [facility] OR nurse.

## 2016-07-12 NOTE — Transfer of Care (Signed)
Immediate Anesthesia Transfer of Care Note  Patient: Damon [name]  Procedure(s) Performed: Procedure(s): INCISION AND DRAINAGE GLUTEAL  ABSCESS (N/A)  Patient Location: PACU  Anesthesia Type:General  Level of Consciousness: awake and alert   Airway & Oxygen Therapy: Patient connected to face mask oxygen  Post-op Assessment: Post -op Vital signs reviewed and stable  Post vital signs: stable  Last Vitals:  Vitals:   07/12/16 2015 07/12/16 2327  BP: (!) 129/56 (!) 116/58  Pulse: 81 78  Resp:  (!) 24  Temp: 37.7 C 36.8 C    Last Pain:  Vitals:   07/12/16 2327  TempSrc: Temporal  PainSc:          Complications: No apparent anesthesia complications

## 2016-07-12 NOTE — Progress Notes (Signed)
Gluteal abscess per CT scan,plan for I&d/debridement tonight,patient is NPO,IV ANTIBIOTICS CONTINUE

## 2016-07-12 NOTE — Progress Notes (Signed)
Pharmacy Antibiotic Note  Damon [name] is a 66 y.o. male admitted on 07/07/2016 with bacteremia and foot infection.  Antibiotics changed from vancomycin and piperacillin/tazobactam to ampicillin/sulbactam on 4/4. ID Following.  Plan: Given worsening renal function, will decrease dose to ampicillin/sulbactam 3 g IV q12h.  Height:  (167.6 cm) Weight: 135 lb 7.6 oz (61.4 kg) IBW/kg (Calculated) : 63.8  Temp (24hrs), Avg:99.6 F (37.6 C), Min:99 F (37.2 C), Max:100.4 F (38 C)   Recent Labs Lab 07/07/16 2207 07/08/16 0340 07/09/16 0348 07/10/16 0409 07/11/16 0227 07/12/16 1305  WBC 14.4* 15.3* 16.5*  --  19.1* 16.2*  CREATININE 1.33* 1.20 1.41* 1.63* 1.90* 2.54*  LATICACIDVEN 1.4  --   --   --   --   --   VANCOTROUGH  --   --   --   --  19  --     Estimated Creatinine Clearance: 25.6 mL/min (A) (by C-G formula based on SCr of 2.54 mg/dL (H)).    No Known Allergies  Micro: 3/31 Blood: Clostridium ramosum  Thank you for allowing pharmacy to be a part of this patient's care.  Cindi Carbon, PharmD, BCPS Clinical Pharmacist 07/12/2016

## 2016-07-12 NOTE — Progress Notes (Signed)
Hypoglycemia 61 mg/dl,MD at bedside,4 oz of juice given,patient asymptomatic,NPO until seen by surgeon for possible procedure,blood sugar rechecked and was 110 mg/dl,MD ordered dextrose in normal saline continuous infusion.

## 2016-07-12 NOTE — Progress Notes (Addendum)
[employer] - [place] at [facility]   PATIENT NAME: Damon [name]    MR#:  [id number]  DATE OF BIRTH:  08-05-1950    CHIEF COMPLAINT:   Chief Complaint  Patient presents with  . Hyperglycemia   Patient is a poor historian. Continues to have febrile episodes. Wife at bedside. No pain.  REVIEW OF SYSTEMS:    Review of Systems  Constitutional: Positive for fever and malaise/fatigue. Negative for chills.  HENT: Negative for sore throat.   Eyes: Negative for blurred vision, double vision and pain.  Respiratory: Negative for cough, hemoptysis, shortness of breath and wheezing.   Cardiovascular: Negative for chest pain, palpitations, orthopnea and leg swelling.  Gastrointestinal: Negative for abdominal pain, constipation, diarrhea, heartburn, nausea and vomiting.  Genitourinary: Negative for dysuria and hematuria.  Musculoskeletal: Negative for back pain and joint pain.  Skin: Negative for rash.  Neurological: Negative for sensory change, speech change, focal weakness and headaches.  Endo/Heme/Allergies: Does not bruise/bleed easily.  Psychiatric/Behavioral: Negative for depression. The patient is not nervous/anxious.     DRUG ALLERGIES:  No Known Allergies  VITALS:  Blood pressure (!) 121/49, pulse 79, temperature 99 F (37.2 C), temperature source Oral, resp. rate 18, height  (1.676 m), weight 61.4 kg (135 lb 7.6 oz), SpO2 97 %.  PHYSICAL EXAMINATION:   Physical Exam  GENERAL:  66 y.o.-year-old patient lying in the bed with no acute distress.  EYES: Pupils equal, round, reactive to light and accommodation. No scleral icterus. Extraocular muscles intact.  HEENT: Head atraumatic, normocephalic. Oropharynx and nasopharynx clear.  NECK:  Supple, no jugular venous distention. No thyroid enlargement, no tenderness.  LUNGS: Normal breath sounds bilaterally, no wheezing, rales, rhonchi. No use of accessory muscles of respiration.  CARDIOVASCULAR: S1, S2 normal.  No murmurs, rubs, or gallops.  ABDOMEN: Soft, nontender, nondistended. Bowel sounds present. No organomegaly or mass.  EXTREMITIES: Crusting and ulcers on the left foot. Right below-knee amputation. Patient has left foot plantar first metatarsophalangeal joint has hyperkeratotic lesion without any open ulcer. NEUROLOGIC: Cranial nerves II through XII are intact. No focal Motor or sensory deficits b/l.   PSYCHIATRIC: The patient is alert and awake SKIN: No obvious rash, lesion, or ulcer.  Sacral decubitus ulcer with clean granulation tissue,stage 3  LABORATORY PANEL:   CBC  Recent Labs Lab 07/11/16 0227  WBC 19.1*  HGB 7.0*  HCT 20.9*  PLT 256   ------------------------------------------------------------------------------------------------------------------ Chemistries   Recent Labs Lab 07/07/16 2207  07/10/16 0409 07/11/16 0227  NA 135  < >  --  133*  K 3.8  < >  --  3.3*  CL 106  < >  --  105  CO2 22  < >  --  21*  GLUCOSE 477*  < >  --  205*  BUN 36*  < >  --  56*  CREATININE 1.33*  < > 1.63* 1.90*  CALCIUM 8.4*  < >  --  7.7*  MG  --   --  1.8  --   AST 84*  --   --   --   ALT 91*  --   --   --   ALKPHOS 87  --   --   --   BILITOT 0.4  --   --   --   < > = values in this interval not displayed. ------------------------------------------------------------------------------------------------------------------  Cardiac Enzymes No results for input(s): TROPONINI in the last 168 hours. ------------------------------------------------------------------------------------------------------------------  RADIOLOGY:  Ct Abdomen Pelvis  Wo Contrast  Result Date: 07/12/2016 CLINICAL DATA:  Patient with history of sepsis of unknown etiology. Sacral ulceration. EXAM: CT ABDOMEN AND PELVIS WITHOUT CONTRAST TECHNIQUE: Multidetector CT imaging of the abdomen and pelvis was performed following the standard protocol without IV contrast. COMPARISON:  Chest CT 07/09/2016; CT abdomen  pelvis 05/29/2014. FINDINGS: Lower chest: Normal heart size. Trace pericardial fluid. Small bilateral layering pleural effusions. Dependent atelectasis. No pneumothorax. Hepatobiliary: The liver is normal in size and contour. Gallbladder is unremarkable. Pancreas: Unremarkable Spleen: Unremarkable Adrenals/Urinary Tract: Normal adrenal glands. 2 mm nonobstructing stone interpolar region right kidney. Urinary bladder is unremarkable. Stomach/Bowel: No abnormal bowel wall thickening or evidence for bowel obstruction. No free fluid or free intraperitoneal air. Normal morphology of the stomach. Small hiatal hernia. Vascular/Lymphatic: Normal caliber abdominal aorta. Peripheral calcified atherosclerotic plaque. Stents within the iliac arteries bilaterally. Reproductive: Prostate unremarkable. Other: Diffuse anasarca. Diffuse stranding throughout the mesentery within the abdomen. Musculoskeletal: Soft tissue ulceration is demonstrated overlying the inferior aspect of the sacrum. There is a fluid and gas collection underlying the sacral decubitus ulcer (image 84; series 2) measuring up to 2.8 cm. Additionally, foci of gas and fluid (approximately 15 cm in length) are demonstrated coursing along the left gluteal musculature laterally to the level of the left femur (image 90; series 2). More inferiorly there is loss of fat plane between this collection and the rectum (image 93; series 2). No definite frank osseous destruction of the underlying sacrum and coccyx. IMPRESSION: Decubitus ulcer demonstrated overlying the left hemi sacrum. Underlying the decubitus is a fluid and gas collection which extends laterally along the gluteal muscular towards the level of the left femur. 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 Additionally, more inferiorly from the above described collection there is loss of fat plane between collection and rectum. Fistulous connection between the rectum and this collection is not excluded. Diffuse  anasarca and third spacing of fluid. Small bilateral pleural effusions and underlying opacities favored to represent atelectasis. 2 mm nonobstructing stone interpolar region right kidney. Aortic atherosclerosis. These results were called by telephone at the time of interpretation on 07/12/2016 at 7:25 am to Dr. Clydie Braun , who verbally acknowledged these results. Electronically Signed   By: Annia Belt M.D.   On: 07/12/2016 07:35     ASSESSMENT AND PLAN:   * Clostridium bacteremia. CT scan of the abdomen shows gluteal abscess. Discussed with Dr. Sampson Goon. Consult surgery. Discussed with Dr. Armond Hang. Patient nothing by mouth. Will need surgery. ON Unasyn  * Sepsis POA  * Stage III sacral decubiti Wound care team following   * Worsening anemia of chronic disease. Transfused 1 unit packed RBC.  * Chronic systolic CHF with ejection fraction of 25-30%. Stable. No signs of fluid overload.  patient had drug-eluting stent in OM in oct 2017.   * Diabetes mellitus, uncontrolled with Hyper and hypoglycemia Presently patient is nothing by mouth for possible surgery. We'll start D5 normal saline. Reduce Lantus to 12 units.  * DVT prophylaxis with Lovenox.  All the records are reviewed and case discussed with Care Management/Social Worker Management plans discussed with the patient, family and they are in agreement.  CODE STATUS: FULL CODE  DVT Prophylaxis: SCDs  TOTAL TIME TAKING CARE OF THIS PATIENT: 35 minutes.   POSSIBLE D/C IN 2-3 DAYS, DEPENDING ON CLINICAL CONDITION.  Milagros Loll R M.D on 07/12/2016 at 12:44 PM  Between 7am to 6pm - Pager - [phone]  After 6pm go to [email or web address] - password [email or web address]  [facility]  [facility]  Office  [phone]  CC: Primary care physician; Emogene Morgan, MD  Note: This dictation was prepared with Dragon dictation along with smaller phrase technology. Any transcriptional errors that result from this process are  unintentional.

## 2016-07-12 NOTE — Anesthesia Preprocedure Evaluation (Signed)
Anesthesia Evaluation  Patient identified by MRN, date of birth, ID band Patient awake    Reviewed: Allergy & Precautions, H&P , NPO status , Patient's Chart, lab work & pertinent test results, reviewed documented beta blocker date and time   Airway Mallampati: II  TM Distance: >3 FB Neck ROM: full    Dental  (+) Poor Dentition   Pulmonary neg pulmonary ROS, COPD,  COPD inhaler, Current Smoker,    Pulmonary exam normal        Cardiovascular hypertension, + angina + CAD, + Past MI, + Peripheral Vascular Disease and +CHF  negative cardio ROS Normal cardiovascular exam+ dysrhythmias  Rhythm:regular Rate:Normal     Neuro/Psych negative neurological ROS  negative psych ROS   GI/Hepatic negative GI ROS, Neg liver ROS,   Endo/Other  negative endocrine ROSdiabetes  Renal/GU Renal diseasenegative Renal ROS  negative genitourinary   Musculoskeletal   Abdominal   Peds  Hematology negative hematology ROS (+) anemia ,   Anesthesia Other Findings Past Medical History: No date: CAD (coronary artery disease)     Comment: a. 01/2016 MI/PCI: LM nl, LAD 188m/d (3.25 x               28 Xience DES), 40d, LCX 30ost, OM1 95 (staged               - 2.75 x 18 Xience Alpine DES), OM2/3 min               irregs, RCA 53m. No date: Chronic combined systolic and diastolic CHF (c*     Comment: a. 01/2016 Echo: Ef 25-30%, Gr1 DD, mild AI,               mildly dil LA;  b. 02/2016 Echo: EF 30-35%,               apical AK, antsept and ant HK, nl RV fxn. No date: CKD (chronic kidney disease), stage III No date: COPD (chronic obstructive pulmonary disease) (* No date: Diabetes mellitus without complication (HCC) No date: Diverticulitis No date: Essential hypertension No date: Ischemic cardiomyopathy     Comment: a. 01/2016 Echo: Ef 25-30%;  b. 02/2016 Echo:               EF 30-35%. No date: LBBB (left bundle branch block) No date: PAD  (peripheral artery disease) (HCC)     Comment: a. 10/2015 s/p PTA and stenting of the RCIA,               LCIA, and PTA of the LEIA (Schnier); b. 04/2016               PTA or R tibioperoneal trunk & prox PT, PTA/DBA              of R Pop and SFA, Viabahn covered stent x 2 to               the R SFA and pop (5mm x 25 cm & 5mm x 15cm). No date: Tobacco abuse Past Surgical History: 06/06/2016: AMPUTATION Right     Comment: Procedure: AMPUTATION BELOW KNEE;  Surgeon:               Annice Needy, MD;  Location: [facility] ORS;  Service:              General;  Laterality: Right; 01/13/2016: CARDIAC CATHETERIZATION N/A     Comment: Procedure: LEFT HEART CATH AND CORONARY  ANGIOGRAPHY;  Surgeon: Iran Ouch, MD;                Location: [facility] INVASIVE CV LAB;  Service:               Cardiovascular;  Laterality: N/A; 01/13/2016: CARDIAC CATHETERIZATION N/A     Comment: Procedure: Coronary Stent Intervention;                Surgeon: Iran Ouch, MD;  Location: [facility]               INVASIVE CV LAB;  Service: Cardiovascular;                Laterality: N/A; 01/16/2016: CARDIAC CATHETERIZATION N/A     Comment: Procedure: Left Heart Cath and Coronary               Angiography;  Surgeon: Iran Ouch, MD;                Location: [facility] INVASIVE CV LAB;  Service:               Cardiovascular;  Laterality: N/A; 05/08/2016: IRRIGATION AND DEBRIDEMENT FOOT Right     Comment: Procedure: IRRIGATION AND DEBRIDEMENT FOOT               WITH PLACEMENT OF ANTIBIOTIC BEADS;  Surgeon:               Linus Galas, DPM;  Location: [facility] ORS;  Service:              Podiatry;  Laterality: Right; No date: NECK SURGERY 11/01/2015: PERIPHERAL VASCULAR CATHETERIZATION Left     Comment: Procedure: Lower Extremity Angiography;                Surgeon: Renford Dills, MD;  Location: [facility]              INVASIVE CV LAB;  Service: Cardiovascular;                Laterality: Left; 05/07/2016: PERIPHERAL VASCULAR  CATHETERIZATION Right     Comment: Procedure: Lower Extremity Angiography;                Surgeon: Annice Needy, MD;  Location: [facility]               INVASIVE CV LAB;  Service: Cardiovascular;                Laterality: Right; BMI    Body Mass Index:  21.87 kg/m     Reproductive/Obstetrics negative OB ROS                             Anesthesia Physical Anesthesia Plan  ASA: IV and emergent  Anesthesia Plan: General ETT   Post-op Pain Management:    Induction:   Airway Management Planned:   Additional Equipment:   Intra-op Plan:   Post-operative Plan:   Informed Consent: I have reviewed the patients History and Physical, chart, labs and discussed the procedure including the risks, benefits and alternatives for the proposed anesthesia with the patient or authorized representative who has indicated his/her understanding and acceptance.   Dental Advisory Given  Plan Discussed with: CRNA  Anesthesia Plan Comments:         Anesthesia Quick Evaluation

## 2016-07-12 NOTE — Evaluation (Signed)
Occupational Therapy Evaluation Patient Details Name: Damon [name] MRN: [id number] DOB: 1950/04/23 Today's Date: 07/12/2016    History of Present Illness pt is a 66 yo male who came to [facility] from [facility] on 07/08/16 w/ fever and hyperglycemia, and non-infeted ulcers on the L foot that was assessed by Podiatry (07/08/16). Diagnosed w/ sepsis due to unsepecified organism. 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 PMH includes; CAD, CHF, CKD, COPD, DM, HTN, PAD, ischemic cardiomyopathy, LBBB, and diverticulosis.  He is to have abdominal CT 07-12-16.    Clinical Impression   Pt is 66 year old male who came to [facility] from [facility] on 07/08/16 with fever, hyperglycemia and non-infected ulcers on L foot that was assessed by Podiatry on 07/08/16.  He has sepsis due to unspecified organism.  He had a recent R BKA on 06/06/16.  He presents with decreased alertness this session and would assist with bed mobility and attempted to sit EOB with max to total assist and increased SOB.  No further out of bed tasks attempted.  Was going to alert NSG about intermittent lethargy and inattentiveness when CNA came in and took blood sugar which was 61.  Pt currently appears very ill and deconditioned with poor stamina and will need continued OT to increase endurance, education in energy conservation tech and pursed lip breathing and ADL retraining.  His wife was present at end of session and updated about status and goals.  Rec SNF after discharge from hospital to continue rehab.       Follow Up Recommendations  SNF    Equipment Recommendations       Recommendations for Other Services       Precautions / Restrictions Precautions Precautions: Fall Required Braces or Orthoses: Other Brace/Splint Other Brace/Splint: Ortho wedge on the L Foot during weightbearing  Restrictions Weight Bearing Restrictions: No      Mobility Bed Mobility                  Transfers                       Balance                                           ADL either performed or assessed with clinical judgement   ADL Overall ADL's : Needs assistance/impaired Eating/Feeding: Set up;Minimal assistance Eating/Feeding Details (indicate cue type and reason): Pt currently NPO for abdominal CT scheduled for today Grooming: Wash/dry hands;Wash/dry face;Oral care;Set up;Minimal assistance;Cueing for sequencing;Sitting Grooming Details (indicate cue type and reason): Pt very slow with responses but able to complete with time with min assist and cues         Upper Body Dressing : Maximal assistance;Set up   Lower Body Dressing: Maximal assistance Lower Body Dressing Details (indicate cue type and reason): Pt required max to total assist to attempt to sit EOB and complained of feeling really weak and asked to stop.                 General ADL Comments: Pt currently requiring max to total assist due to decreased trunk control and weakness to sit at EOB.  He is not able to reach his feet from lying position and Ortho wedge for LLE on bedside table for use when standing or walking. Pt  with lethargy which may have been due to BS of 61 which CNA took during session.  Pt had hyperglycemia upon admission.       Vision Patient Visual Report: No change from baseline       Perception     Praxis      Pertinent Vitals/Pain Pain Assessment: No/denies pain (delay in responses with questions )     Hand Dominance Right   Extremity/Trunk Assessment Upper Extremity Assessment Upper Extremity Assessment: Generalized weakness   Lower Extremity Assessment Lower Extremity Assessment: Defer to PT evaluation       Communication Communication Communication: No difficulties;Other (comment) (delay in responses to questions and NSG came in and took blood sugar which was 61)   Cognition Arousal/Alertness: Lethargic Behavior During Therapy: Flat affect Overall Cognitive Status: Within  Functional Limits for tasks assessed                                 General Comments: pt would stop attending and verbalizing in the middle of conversation and start to get sleepy but not aware he was doing this.  CNA came in to take his blood sugar which was 61 which could have been contributing to this   General Comments       Exercises     Shoulder Instructions      Home Living Family/patient expects to be discharged to:: Skilled nursing facility                                        Prior Functioning/Environment Level of Independence: Needs assistance  Gait / Transfers Assistance Needed: pt states he was able to transfer to and from wheelchair w/ assistance at SNF ADL's / Homemaking Assistance Needed: Performed by family; family drives he and wife to appointments as needed   Comments: Mod indep with ADLs, limited household activity with RW, wife having increased difficulty caring for pt        OT Problem List: Decreased strength;Decreased range of motion;Decreased activity tolerance;Impaired balance (sitting and/or standing);Decreased safety awareness      OT Treatment/Interventions: Self-care/ADL training;DME and/or AE instruction;Therapeutic activities;Balance training;Patient/family education;Energy conservation;Therapeutic exercise    OT Goals(Current goals can be found in the care plan section) Acute Rehab OT Goals Patient Stated Goal: To get stronger  OT Goal Formulation: With patient/family Time For Goal Achievement: 07/26/16 Potential to Achieve Goals: Good ADL Goals Pt Will Perform Lower Body Dressing: with mod assist;sit to/from stand (with ortho wedge boot on LLE) Pt Will Transfer to Toilet: with mod assist;stand pivot transfer;bedside commode Pt Will Perform Toileting - Clothing Manipulation and hygiene: with min assist;sit to/from stand Pt/caregiver will Perform Home Exercise Program: With written HEP provided;With Supervision  (for energy coserv and purse lip breathing)  OT Frequency: Min 1X/week   Barriers to D/C:            Co-evaluation              End of Session Nurse Communication: Other (comment) (lethargy---could be due to low BS of 61)  Activity Tolerance: Patient limited by fatigue;Patient limited by lethargy Patient left: in bed;with call bell/phone within reach;with bed alarm set;with family/visitor present  OT Visit Diagnosis: Muscle weakness (generalized) (M62.81);Unsteadiness on feet (R26.81);Other (comment) (R BKA)  Time: 1610-9604 OT Time Calculation (min): 33 min Charges:  OT General Charges $OT Visit: 1 Procedure OT Evaluation $OT Eval Moderate Complexity: 1 Procedure OT Treatments $Self Care/Home Management : 8-22 mins G-Codes:     Susanne Borders, OTR/L ascom [phone] 07/12/16, 12:50 PM

## 2016-07-12 NOTE — Anesthesia Post-op Follow-up Note (Cosign Needed)
Anesthesia QCDR form completed.        

## 2016-07-12 NOTE — Progress Notes (Signed)
Pt seen and examined. HE is febrile increase wbc from decubitus ulcer that has developed significant infection and abscess and is in need for source control. HE is on plavix but given his persistent fevers I do think that Source control is a priority. D/W the pt in detail about procedure, risks ,benefits and possible complications including but not limited to bleeding, infection, re interventions, pain . He understands.

## 2016-07-12 NOTE — Progress Notes (Addendum)
MD Pabon at bedside, consent for surgery obtained by MD. Family has been notified by pt.

## 2016-07-12 NOTE — Op Note (Addendum)
  07/07/2016 - 07/12/2016  11:10 PM  PATIENT:  Damon [name] Born  66 y.o. male  PRE-OPERATIVE DIAGNOSIS:  Sacral abscess with necrotizing soft tissue infection from decubitus ulcer  POST-OPERATIVE DIAGNOSIS:  Same  PROCEDURE: 1. Incision and drainage of complex Sacral abscess 2.Excisional debridement of skin subcutaneous tissue, muscle and bone measuring 210 square centimeters. 15 x 14 cms,3 cms depth 3. Sacral biopsy to r/o osteo   FINDINGS: large and complex abscess in the sacral area with necrotizing infection and pyomyositis involving skin, muscle and  fascia down to the bone. Some extension laterally.   SURGEON:  Surgeon(s) and Role:    * Lily Kernen F Everlene Farrier, MD - Primary   ANESTHESIA: GETA    DICTATION:  Patient was playing about proceeding detail, risk benefits possible complications and a consent was obtained. The patient taken to the operating room and placed in the prone position. A large stage Iv decubitus ulcer was found and using 10 blade knife incision was created. There was complex luculations that we were able to lyse with a combination of finger fracture and suction device, significant pus, death fascia and muscle was encountered and pus was drained. All the loculations were broken down. Using a metz we  debrided the sub q tissue down to the muscle and bone. Hemostasis was obtained with electrocautery. We obtained measures and using a rongeur debrided the sacrum and took a incisional biopsy to r/o osteo.  Irrigation with normal saline and the wound was packed with half strength betadine. ( no Dakins available)   Needle and laparotomy counts were correct and there were no immediate complications.  I added clinda and vanco due to the severe nature of his infection.  [name], MD

## 2016-07-12 NOTE — Anesthesia Procedure Notes (Signed)
Procedure Name: Intubation Date/Time: 07/12/2016 10:27 PM Performed by: Aline Brochure Pre-anesthesia Checklist: Patient identified, Emergency Drugs available, Suction available and Patient being monitored Patient Re-evaluated:Patient Re-evaluated prior to inductionOxygen Delivery Method: Circle system utilized Preoxygenation: Pre-oxygenation with 100% oxygen Intubation Type: IV induction Ventilation: Mask ventilation without difficulty Laryngoscope Size: Mac and 3 Grade View: Grade II Tube size: 7.5 mm Number of attempts: 1 Airway Equipment and Method: Stylet Placement Confirmation: ETT inserted through vocal cords under direct vision,  CO2 detector and breath sounds checked- equal and bilateral Secured at: 22 cm Tube secured with: Tape Dental Injury: Teeth and Oropharynx as per pre-operative assessment

## 2016-07-12 NOTE — Progress Notes (Signed)
PT Cancellation Note  Patient Details Name: Damon [name] MRN: [id number] DOB: 1950/05/14   Cancelled Treatment:    Reason Eval/Treat Not Completed: Other (comment);Fatigue/lethargy limiting ability to participate;Medical issues which prohibited therapy (wife and pt decline the visit, awaiting consult).  Will try again tomorrow.   Damon [name] 07/12/2016, 12:22 PM   Samul Dada, PT MS Acute Rehab Dept. Number: [facility] [id number] and [facility] [phone]

## 2016-07-13 ENCOUNTER — Encounter: Payer: Self-pay | Admitting: Surgery

## 2016-07-13 LAB — BASIC METABOLIC PANEL
Anion gap: 9 (ref 5–15)
BUN: 69 mg/dL — AB (ref 6–20)
CALCIUM: 7.9 mg/dL — AB (ref 8.9–10.3)
CHLORIDE: 107 mmol/L (ref 101–111)
CO2: 21 mmol/L — ABNORMAL LOW (ref 22–32)
Creatinine, Ser: 2.5 mg/dL — ABNORMAL HIGH (ref 0.61–1.24)
GFR, EST AFRICAN AMERICAN: 30 mL/min — AB (ref >60)
GFR, EST NON AFRICAN AMERICAN: 26 mL/min — AB (ref >60)
Glucose, Bld: 145 mg/dL — ABNORMAL HIGH (ref 65–99)
Potassium: 3.2 mmol/L — ABNORMAL LOW (ref 3.5–5.1)
SODIUM: 137 mmol/L (ref 135–145)

## 2016-07-13 LAB — GLUCOSE, CAPILLARY
GLUCOSE-CAPILLARY: 137 mg/dL — AB (ref 65–99)
GLUCOSE-CAPILLARY: 119 mg/dL — AB (ref 65–99)
GLUCOSE-CAPILLARY: 204 mg/dL — AB (ref 65–99)
Glucose-Capillary: 295 mg/dL — ABNORMAL HIGH (ref 65–99)
Glucose-Capillary: 261 mg/dL — ABNORMAL HIGH (ref 65–99)

## 2016-07-13 LAB — CBC WITH DIFFERENTIAL/PLATELET
BASOS PCT: 0 %
Basophils Absolute: 0.1 10*3/uL (ref 0–0.1)
EOS ABS: 0 10*3/uL (ref 0–0.7)
EOS PCT: 0 %
HCT: 23.3 % — ABNORMAL LOW (ref 40.0–52.0)
HEMOGLOBIN: 7.7 g/dL — AB (ref 13.0–18.0)
Lymphocytes Relative: 6 %
Lymphs Abs: 0.8 10*3/uL — ABNORMAL LOW (ref 1.0–3.6)
MCH: 29.8 pg (ref 26.0–34.0)
MCHC: 33 g/dL (ref 32.0–36.0)
MCV: 90.3 fL (ref 80.0–100.0)
MONOS PCT: 3 %
Monocytes Absolute: 0.5 10*3/uL (ref 0.2–1.0)
Neutro Abs: 13.7 10*3/uL — ABNORMAL HIGH (ref 1.4–6.5)
Neutrophils Relative %: 91 %
PLATELETS: 261 10*3/uL (ref 150–440)
RBC: 2.58 MIL/uL — ABNORMAL LOW (ref 4.40–5.90)
RDW: 17.1 % — AB (ref 11.5–14.5)
WBC: 15.1 10*3/uL — AB (ref 3.8–10.6)

## 2016-07-13 MED ORDER — ENOXAPARIN SODIUM 30 MG/0.3ML ~~LOC~~ SOLN: 30.0000 mg | SUBCUTANEOUS | Status: DC

## 2016-07-13 MED ORDER — DAKINS (1/4 STRENGTH) 0.125 % EX SOLN
Freq: Two times a day (BID) | CUTANEOUS | Status: AC
  Administered 2016-07-13 – 2016-07-16 (×6)
  Filled 2016-07-13: qty 473

## 2016-07-13 MED ORDER — POTASSIUM CHLORIDE IN NACL 20-0.9 MEQ/L-% IV SOLN
INTRAVENOUS | Status: DC
  Administered 2016-07-13 – 2016-07-16 (×6): via INTRAVENOUS
  Filled 2016-07-13 (×8): qty 1000

## 2016-07-13 MED ORDER — INSULIN GLARGINE 100 UNIT/ML ~~LOC~~ SOLN
10.0000 [IU] | Freq: Every day | SUBCUTANEOUS | Status: DC
  Administered 2016-07-13 – 2016-07-15 (×3): 10 [IU] via SUBCUTANEOUS
  Filled 2016-07-13 (×4): qty 0.1

## 2016-07-13 MED ORDER — MORPHINE SULFATE (PF) 4 MG/ML IV SOLN
2.0000 mg | INTRAVENOUS | Status: DC | PRN
  Administered 2016-07-13 – 2016-07-14 (×5): 2 mg via INTRAVENOUS
  Filled 2016-07-13 (×5): qty 1

## 2016-07-13 MED ORDER — CLINDAMYCIN PHOSPHATE 600 MG/50ML IV SOLN
600.0000 mg | Freq: Three times a day (TID) | INTRAVENOUS | Status: DC
  Administered 2016-07-13 – 2016-07-15 (×8): 600 mg via INTRAVENOUS
  Filled 2016-07-13 (×10): qty 50

## 2016-07-13 MED ORDER — MORPHINE SULFATE (PF) 4 MG/ML IV SOLN: 2.0000 mg | INTRAVENOUS | Status: DC | PRN

## 2016-07-13 MED ORDER — VANCOMYCIN HCL IN DEXTROSE 1-5 GM/200ML-% IV SOLN
1000.0000 mg | INTRAVENOUS | Status: DC
  Administered 2016-07-13: 1000 mg via INTRAVENOUS
  Filled 2016-07-13: qty 200

## 2016-07-13 MED ORDER — CARVEDILOL 6.25 MG PO TABS: 6.2500 mg | ORAL_TABLET | Freq: Two times a day (BID) | ORAL | Status: DC

## 2016-07-13 NOTE — Progress Notes (Signed)
Enoxaparin   Patient qualifies for Enoxaparin 30 mg SQ daily based on CrCl <30 ml/min per policy. Will change to Enoxaparin 30 mg SQ daily.  Demetrius Charity, PharmD, BCPS

## 2016-07-13 NOTE — Progress Notes (Signed)
1 Day Post-Op   Subjective:  Patient seen this afternoon. Complains of significant pain to his I and D site but otherwise states he is feeling fine. He is hungry and desires more for dinner.  Vital signs in last 24 hours: Temp:  [97.5 F (36.4 C)-99.8 F (37.7 C)] 97.5 F (36.4 C) (04/06 1300) Pulse Rate:  [65-81] 71 (04/06 1542) Resp:  [16-29] 18 (04/06 1542) BP: (90-129)/(40-61) 98/49 (04/06 1542) SpO2:  [83 %-100 %] 100 % (04/06 1542) Weight:  [64.9 kg (143 lb)] 64.9 kg (143 lb) (04/06 0527) Last BM Date: 07/12/16  Intake/Output from previous day: 04/05 0701 - 04/06 0700 In: 750 [I.V.:550; IV Piggyback:200] Out: 0   GI: soft, non-tender; bowel sounds normal; no masses,  no organomegaly  Skin: Previous I&D site visualized with dressing in place. Dressing is just been changed for the patient not taken down by me. No evidence of spreading erythema or purulence on the dressing.  Lab Results:  CBC  Recent Labs  07/12/16 1305 07/13/16 0400  WBC 16.2* 15.1*  HGB 7.4* 7.7*  HCT 22.3* 23.3*  PLT 261 261   CMP     Component Value Date/Time   NA 137 07/13/2016 0400   NA 144 02/01/2016 1528   NA 129 (L) 09/03/2012 0552   K 3.2 (L) 07/13/2016 0400   K 4.4 09/03/2012 0552   CL 107 07/13/2016 0400   CL 99 09/03/2012 0552   CO2 21 (L) 07/13/2016 0400   CO2 20 (L) 09/03/2012 0552   GLUCOSE 145 (H) 07/13/2016 0400   GLUCOSE 680 (HH) 09/03/2012 0552   BUN 69 (H) 07/13/2016 0400   BUN 23 02/01/2016 1528   BUN 22 (H) 09/03/2012 0552   CREATININE 2.50 (H) 07/13/2016 0400   CREATININE 1.73 (H) 09/03/2012 0552   CALCIUM 7.9 (L) 07/13/2016 0400   CALCIUM 9.5 09/03/2012 0552   PROT 6.2 (L) 07/07/2016 2207   PROT 7.8 09/03/2012 0552   ALBUMIN 2.3 (L) 07/07/2016 2207   ALBUMIN 4.0 09/03/2012 0552   AST 84 (H) 07/07/2016 2207   AST 15 09/03/2012 0552   ALT 91 (H) 07/07/2016 2207   ALT 29 09/03/2012 0552   ALKPHOS 87 07/07/2016 2207   ALKPHOS 175 (H) 09/03/2012 0552   BILITOT  0.4 07/07/2016 2207   BILITOT 0.3 09/03/2012 0552   GFRNONAA 26 (L) 07/13/2016 0400   GFRNONAA 42 (L) 09/03/2012 0552   GFRAA 30 (L) 07/13/2016 0400   GFRAA 49 (L) 09/03/2012 0552   PT/INR No results for input(s): LABPROT, INR in the last 72 hours.  Studies/Results: No results found.  Assessment/Plan: 66 year old male status post I&D of large perirectal abscess with myofascitis. Plan to transition from Betadine to Dakin soaked gauze for dressing. Continue local wound care. Continue IV antibiotics. Surgery will continue to follow with you.   Ricarda Frame, MD [facility] General Surgeon [facility]  Day [facility] [phone] Night [facility] [phone]  07/13/2016

## 2016-07-13 NOTE — Progress Notes (Signed)
Pharmacy Antibiotic Note  Damon [name] is a 66 y.o. male admitted on 07/07/2016 with necrotizing sacral infection.  Pharmacy has been consulted for vancomycin dosing.  Plan: DW 61kg  Vd 43L kei 0.026 hr-1  t1/2 27 hours Vancomycin 1 gram q 36 hours ordered with stacked dosing. Level before 4th dose. Goal trough 15-20.  Height:  (167.6 cm) Weight: 135 lb 7.6 oz (61.4 kg) IBW/kg (Calculated) : 63.8  Temp (24hrs), Avg:99.1 F (37.3 C), Min:98 F (36.7 C), Max:100.4 F (38 C)   Recent Labs Lab 07/07/16 2207 07/08/16 0340 07/09/16 0348 07/10/16 0409 07/11/16 0227 07/12/16 1305  WBC 14.4* 15.3* 16.5*  --  19.1* 16.2*  CREATININE 1.33* 1.20 1.41* 1.63* 1.90* 2.54*  LATICACIDVEN 1.4  --   --   --   --   --   VANCOTROUGH  --   --   --   --  19  --     Estimated Creatinine Clearance: 25.6 mL/min (A) (by C-G formula based on SCr of 2.54 mg/dL (H)).    No Known Allergies  Antimicrobials this admission: Vancomycin, clindamycin, Zosyn 4/5 >>    >>   Dose adjustments this admission:   Microbiology results: 3/31 BCx: BCID no pathogens identified 4/4 MRSA PCR: (-) 4/5 Wound cx: pending   Thank you for allowing pharmacy to be a part of this patient's care.  Taffy Delconte S 07/13/2016 1:48 AM

## 2016-07-13 NOTE — Clinical Social Work Note (Signed)
CSW spoke to patient and his wife who confirmed that patient's insurance has expired July 07, 2016.  Patient and his wife said they could not afford the [employer] coverage.  Patient and his wife reported that financial counseling met with them yesterday to try to begin Medicaid and disability application.  Due to patient not having insurance currently [facility] is unable to accept patient back, CSW to look at other possible placements that can accept patient without insurance.  CSW to continue to follow patient's progress throughout discharge planning.  Jones Broom. Johnson, MSW, [place]  07/13/2016 1:48 PM

## 2016-07-13 NOTE — Consult Note (Signed)
[facility] CLINIC INFECTIOUS DISEASE PROGRESS NOTE Date of Admission:  07/07/2016     ID: Damon [name] is a 66 y.o. male with gluteal abscess, clostridial sepsis.  Principal Problem:   Sepsis (HCC) Active Problems:   Coronary artery disease   Essential hypertension   HLD (hyperlipidemia)   Diabetes mellitus type 2 with complications (HCC)   Chronic systolic heart failure (HCC)   PAD (peripheral artery disease) (HCC)   Ulcer of left foot (HCC)   Abscess, gluteal, left   Necrotizing soft tissue infection (HCC)   Subjective: Resting after surgery.  ROS  Eleven systems are reviewed and negative except per hpi  Medications:  Antibiotics Given (last 72 hours)    Date/Time Action Medication Dose Rate   07/10/16 2128 Given   piperacillin-tazobactam (ZOSYN) 3.375 g in dextrose 5 % 50 mL IVPB 3.375 g 12.5 mL/hr   07/11/16 0300 Given   vancomycin (VANCOCIN) 750 mg in dextrose 5 % 150 mL IVPB 750 mg 75 mL/hr   07/11/16 0641 Given   piperacillin-tazobactam (ZOSYN) 3.375 g in dextrose 5 % 50 mL IVPB 3.375 g 12.5 mL/hr   07/11/16 1728 Given   Ampicillin-Sulbactam (UNASYN) 3 g in sodium chloride 0.9 % 100 mL IVPB 3 g 200 mL/hr   07/11/16 2210 Given   Ampicillin-Sulbactam (UNASYN) 3 g in sodium chloride 0.9 % 100 mL IVPB 3 g 200 mL/hr   07/12/16 0400 Given   Ampicillin-Sulbactam (UNASYN) 3 g in sodium chloride 0.9 % 100 mL IVPB 3 g 200 mL/hr   07/12/16 0932 Given   Ampicillin-Sulbactam (UNASYN) 3 g in sodium chloride 0.9 % 100 mL IVPB 3 g 200 mL/hr   07/12/16 2100 Given   Ampicillin-Sulbactam (UNASYN) 3 g in sodium chloride 0.9 % 100 mL IVPB 3 g 200 mL/hr   07/12/16 2108 Given   piperacillin-tazobactam (ZOSYN) IVPB 3.375 g 3.375 g 12.5 mL/hr   07/13/16 0217 Given   clindamycin (CLEOCIN) IVPB 600 mg 600 mg 100 mL/hr   07/13/16 0506 Given   clindamycin (CLEOCIN) IVPB 600 mg 600 mg 100 mL/hr   07/13/16 0544 Given   piperacillin-tazobactam (ZOSYN) IVPB 3.375 g 3.375 g 12.5 mL/hr   07/13/16  1338 Given   piperacillin-tazobactam (ZOSYN) IVPB 3.375 g 3.375 g 12.5 mL/hr   07/13/16 1338 Given   clindamycin (CLEOCIN) IVPB 600 mg 600 mg 100 mL/hr     . atorvastatin  80 mg Oral q1800  . [START ON 07/14/2016] carvedilol  6.25 mg Oral BID WC  . clindamycin (CLEOCIN) IV  600 mg Intravenous Q8H  . [START ON 07/14/2016] enoxaparin (LOVENOX) injection  30 mg Subcutaneous Q24H  . feeding supplement (ENSURE ENLIVE)  237 mL Oral BID BM  . insulin aspart  0-15 Units Subcutaneous Q4H  . insulin glargine  10 Units Subcutaneous QHS  . iopamidol  30 mL Oral Once  . pantoprazole  40 mg Oral Daily  . piperacillin-tazobactam  3.375 g Intravenous Q8H  . tamsulosin  0.4 mg Oral Daily  . vancomycin  1,000 mg Intravenous Q36H    Objective: Vital signs in last 24 hours: Temp:  [97.5 F (36.4 C)-99.9 F (37.7 C)] 97.5 F (36.4 C) (04/06 1300) Pulse Rate:  [65-81] 71 (04/06 1542) Resp:  [16-29] 18 (04/06 1542) BP: (90-129)/(40-61) 98/49 (04/06 1542) SpO2:  [83 %-100 %] 100 % (04/06 1542) Weight:  [64.9 kg (143 lb)] 64.9 kg (143 lb) (04/06 0527) Constitutional: He is awake and interactive but appears chronically ill and older than stated age  HENT: anicteric, muddy sclera Mouth/Throat: Oropharynx is clear and dry, poor dentition . No oropharyngeal exudate.  Cardiovascular: Normal rate, regular rhythm and normal heart sounds.  Pulmonary/Chest: Effort normal and breath sounds normal. No respiratory distress. He has no wheezes.  Abdominal: Soft. Bowel sounds are normal. He exhibits no distension. There is no tenderness.  Lymphadenopathy: He has no cervical adenopathy.  Neurological: He is awake and interactive Skin: wound covered Psychiatric: He has a normal mood and affect. His behavior is normal.   Lab Results  Recent Labs  07/12/16 1305 07/13/16 0400  WBC 16.2* 15.1*  HGB 7.4* 7.7*  HCT 22.3* 23.3*  NA 134* 137  K 3.1* 3.2*  CL 105 107  CO2 20* 21*  BUN 70* 69*  CREATININE 2.54* 2.50*     Microbiology: Results for orders placed or performed during the hospital encounter of 07/07/16  Blood Culture (routine x 2)     Status: Abnormal   Collection Time: 07/07/16 10:07 PM  Result Value Ref Range Status   Specimen Description BLOOD LEFT FOREARM  Final   Special Requests   Final    BOTTLES DRAWN AEROBIC AND ANAEROBIC Blood Culture adequate volume   Culture  Setup Time   Final    GRAM POSITIVE RODS ANAEROBIC BOTTLE ONLY CRITICAL VALUE NOTED.  VALUE IS CONSISTENT WITH PREVIOUSLY REPORTED AND CALLED VALUE.    Culture (A)  Final    CLOSTRIDIUM RAMOSUM Standardized susceptibility testing for this organism is not available.    Report Status 07/11/2016 FINAL  Final  Blood Culture (routine x 2)     Status: Abnormal   Collection Time: 07/07/16 10:07 PM  Result Value Ref Range Status   Specimen Description BLOOD RIGHT HAND  Final   Special Requests   Final    BOTTLES DRAWN AEROBIC AND ANAEROBIC Blood Culture adequate volume   Culture  Setup Time   Final    Organism ID to follow GRAM POSITIVE RODS ANAEROBIC BOTTLE ONLY CRITICAL RESULT CALLED TO, READ BACK BY AND VERIFIED WITH: NATE COOKSON AT 2046 07/08/16.SDR/PMH    Culture (A)  Final    CLOSTRIDIUM RAMOSUM Standardized susceptibility testing for this organism is not available. Performed at [facility], [address], [place], [place] [address]    Report Status 07/11/2016 FINAL  Final  Blood Culture ID Panel (Reflexed)     Status: None   Collection Time: 07/07/16 10:07 PM  Result Value Ref Range Status   Enterococcus species NOT DETECTED NOT DETECTED Final   Listeria monocytogenes NOT DETECTED NOT DETECTED Final   Staphylococcus species NOT DETECTED NOT DETECTED Final   Staphylococcus aureus NOT DETECTED NOT DETECTED Final   Streptococcus species NOT DETECTED NOT DETECTED Final   Streptococcus agalactiae NOT DETECTED NOT DETECTED Final   Streptococcus pneumoniae NOT DETECTED NOT DETECTED Final   Streptococcus  pyogenes NOT DETECTED NOT DETECTED Final   Acinetobacter baumannii NOT DETECTED NOT DETECTED Final   Enterobacteriaceae species NOT DETECTED NOT DETECTED Final   Enterobacter cloacae complex NOT DETECTED NOT DETECTED Final   Escherichia coli NOT DETECTED NOT DETECTED Final   Klebsiella oxytoca NOT DETECTED NOT DETECTED Final   Klebsiella pneumoniae NOT DETECTED NOT DETECTED Final   Proteus species NOT DETECTED NOT DETECTED Final   Serratia marcescens NOT DETECTED NOT DETECTED Final   Haemophilus influenzae NOT DETECTED NOT DETECTED Final   Neisseria meningitidis NOT DETECTED NOT DETECTED Final   Pseudomonas aeruginosa NOT DETECTED NOT DETECTED Final   Candida albicans NOT  DETECTED NOT DETECTED Final   Candida glabrata NOT DETECTED NOT DETECTED Final   Candida krusei NOT DETECTED NOT DETECTED Final   Candida parapsilosis NOT DETECTED NOT DETECTED Final   Candida tropicalis NOT DETECTED NOT DETECTED Final  MRSA PCR Screening     Status: None   Collection Time: 07/11/16 11:14 PM  Result Value Ref Range Status   MRSA by PCR NEGATIVE NEGATIVE Final    Comment:        The GeneXpert MRSA Assay (FDA approved for NASAL specimens only), is one component of a comprehensive MRSA colonization surveillance program. It is not intended to diagnose MRSA infection nor to guide or monitor treatment for MRSA infections.   Aerobic/Anaerobic Culture (surgical/deep wound)     Status: None (Preliminary result)   Collection Time: 07/12/16 10:38 PM  Result Value Ref Range Status   Specimen Description [facility]  Final   Special Requests NONE  Final   Gram Stain   Final    RARE WBC PRESENT, PREDOMINANTLY PMN RARE GRAM POSITIVE COCCI IN PAIRS Performed at [facility], [address] [place], [place] [address]    Culture PENDING  Incomplete   Report Status PENDING  Incomplete  Aerobic/Anaerobic Culture (surgical/deep wound)     Status: None (Preliminary result)   Collection Time: 07/12/16  10:39 PM  Result Value Ref Range Status   Specimen Description [facility]  Final   Special Requests NONE  Final   Gram Stain   Final    RARE WBC PRESENT, PREDOMINANTLY PMN RARE GRAM POSITIVE COCCI IN PAIRS RARE GRAM POSITIVE RODS Performed at [facility], [address], [place], [place] [address]    Culture PENDING  Incomplete   Report Status PENDING  Incomplete    Studies/Results: Ct Abdomen Pelvis Wo Contrast  Result Date: 07/12/2016 CLINICAL DATA:  Patient with history of sepsis of unknown etiology. Sacral ulceration. EXAM: CT ABDOMEN AND PELVIS WITHOUT CONTRAST TECHNIQUE: Multidetector CT imaging of the abdomen and pelvis was performed following the standard protocol without IV contrast. COMPARISON:  Chest CT 07/09/2016; CT abdomen pelvis 05/29/2014. FINDINGS: Lower chest: Normal heart size. Trace pericardial fluid. Small bilateral layering pleural effusions. Dependent atelectasis. No pneumothorax. Hepatobiliary: The liver is normal in size and contour. Gallbladder is unremarkable. Pancreas: Unremarkable Spleen: Unremarkable Adrenals/Urinary Tract: Normal adrenal glands. 2 mm nonobstructing stone interpolar region right kidney. Urinary bladder is unremarkable. Stomach/Bowel: No abnormal bowel wall thickening or evidence for bowel obstruction. No free fluid or free intraperitoneal air. Normal morphology of the stomach. Small hiatal hernia. Vascular/Lymphatic: Normal caliber abdominal aorta. Peripheral calcified atherosclerotic plaque. Stents within the iliac arteries bilaterally. Reproductive: Prostate unremarkable. Other: Diffuse anasarca. Diffuse stranding throughout the mesentery within the abdomen. Musculoskeletal: Soft tissue ulceration is demonstrated overlying the inferior aspect of the sacrum. There is a fluid and gas collection underlying the sacral decubitus ulcer (image 84; series 2) measuring up to 2.8 cm. Additionally, foci of gas and fluid (approximately 15 cm in length) are  demonstrated coursing along the left gluteal musculature laterally to the level of the left femur (image 90; series 2). More inferiorly there is loss of fat plane between this collection and the rectum (image 93; series 2). No definite frank osseous destruction of the underlying sacrum and coccyx. IMPRESSION: Decubitus ulcer demonstrated overlying the left hemi sacrum. Underlying the decubitus is a fluid and gas collection which extends laterally along the gluteal muscular towards the level of the left femur. 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  Additionally, more inferiorly from the above described collection there is loss of fat plane between collection and rectum. Fistulous connection between the rectum and this collection is not excluded. Diffuse anasarca and third spacing of fluid. Small bilateral pleural effusions and underlying opacities favored to represent atelectasis. 2 mm nonobstructing stone interpolar region right kidney. Aortic atherosclerosis. These results were called by telephone at the time of interpretation on 07/12/2016 at 7:25 am to Dr. Clydie Braun , who verbally acknowledged these results. Electronically Signed   By: Annia Belt M.D.   On: 07/12/2016 07:35    Assessment/Plan: LAQUON EMEL is a 66 y.o. male with clostridial sepsis from sacral ulcer and gluteal abscess. Now s/p I and D.   Recommendations Cont  unasyn. Cont wound care per surgery.  Will likely need 2 weeks IV abx Thank you very much for the consult. Will follow with you.  Allon Costlow P   07/13/2016, 4:17 PM

## 2016-07-13 NOTE — NC FL2 (Signed)
[place] MEDICAID FL2 LEVEL OF CARE SCREENING TOOL     IDENTIFICATION  Patient Name: Damon [name] Birthdate: 25-Aug-1950 Sex: male Admission Date (Current Location): 07/07/2016  [place] and [place] Number:  Chiropodist and Address:  [facility], [address] [place], [place] [address]      Provider Number: [id number]  Attending Physician Name and Address:  Milagros Loll, MD  Relative Name and Phone Number:     [place], [place] [phone]   [place],Michael Brother [phone]      Current Level of Care: Hospital Recommended Level of Care: Skilled Nursing Facility Prior Approval Number:    Date Approved/Denied:   PASRR Number: [id number]  Discharge Plan: SNF    Current Diagnoses: Patient Active Problem List   Diagnosis Date Noted  . Abscess, gluteal, left   . Necrotizing soft tissue infection (HCC)   . Ulcer of left foot (HCC) 07/07/2016  . Hx of BKA, right (HCC) 06/18/2016  . PAD (peripheral artery disease) (HCC) 06/18/2016  . Atypical chest pain   . Preop cardiovascular exam   . Chronic systolic heart failure (HCC)   . Sepsis (HCC) 06/01/2016  . Stable angina (HCC)   . Anemia   . Cellulitis   . Hypertensive urgency   . Right foot ulcer (HCC) 05/03/2016  . Diabetes mellitus type 2 with complications (HCC) 05/02/2016  . 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 Protein-calorie malnutrition, severe 03/28/2016  . Acute renal failure (ARF) (HCC) 03/27/2016  . Hypotension 03/27/2016  . SOB (shortness of breath) 02/15/2016  . CKD (chronic kidney disease), stage II 02/15/2016  . Coronary artery disease 01/17/2016  . Essential hypertension 01/17/2016  . HLD (hyperlipidemia) 01/17/2016  . ST elevation myocardial infarction involving left anterior descending (LAD) coronary artery (HCC)   . Cardiomyopathy, ischemic   . Acute respiratory failure (HCC) 01/13/2016   . NSTEMI (non-ST elevated myocardial infarction) (HCC)     Orientation RESPIRATION BLADDER Height & Weight     Self, Time, Situation, Place  O2 (2L) Incontinent Weight: 143 lb (64.9 kg) Height:   (167.6 cm)  BEHAVIORAL SYMPTOMS/MOOD NEUROLOGICAL BOWEL NUTRITION STATUS   (none )  (none) Incontinent Diet (Carb Modified)  AMBULATORY STATUS COMMUNICATION OF NEEDS Skin   Limited Assist Verbally PU Stage and Appropriate Care     PU Stage 3 Dressing: Daily                 Personal Care Assistance Level of Assistance  Bathing, Feeding, Dressing Bathing Assistance: Limited assistance Feeding assistance: Limited assistance Dressing Assistance: Limited assistance     Functional Limitations Info  Sight, Hearing, Speech Sight Info: Adequate Hearing Info: Adequate Speech Info: Adequate    SPECIAL CARE FACTORS FREQUENCY  PT (By licensed PT)     PT Frequency: 5x a week OT Frequency:  (5)            Contractures Contractures Info: Not present    Additional Factors Info  Code Status, Allergies, Insulin Sliding Scale Code Status Info: Full Code Allergies Info: NKA   Insulin Sliding Scale Info: insulin aspart (novoLOG) injection 0-15 Units       Current Medications (07/13/2016):  This is the current hospital active medication list Current Facility-Administered Medications  Medication Dose Route Frequency Provider Last Rate Last Dose  . 0.9 % NaCl with KCl 20 mEq/ L  infusion   Intravenous Continuous Milagros Loll, MD 75 mL/hr at  07/13/16 0926    . acetaminophen (TYLENOL) tablet 650 mg  650 mg Oral Q6H PRN Oralia Manis, MD   650 mg at 07/11/16 1418   Or  . acetaminophen (TYLENOL) suppository 650 mg  650 mg Rectal Q6H PRN Oralia Manis, MD      . albuterol (PROVENTIL) (2.5 MG/3ML) 0.083% nebulizer solution 2.5 mg  2.5 mg Nebulization Q6H PRN Katha Hamming, MD   2.5 mg at 07/10/16 1217  . atorvastatin (LIPITOR) tablet 80 mg  80 mg Oral q1800 Oralia Manis, MD   80 mg  at 07/12/16 1718  . [START ON 07/14/2016] carvedilol (COREG) tablet 6.25 mg  6.25 mg Oral BID WC Srikar Sudini, MD      . clindamycin (CLEOCIN) IVPB 600 mg  600 mg Intravenous Q8H Diego F Pabon, MD   600 mg at 07/13/16 0506  . enoxaparin (LOVENOX) injection 40 mg  40 mg Subcutaneous Q24H Oralia Manis, MD   40 mg at 07/12/16 0130  . feeding supplement (ENSURE ENLIVE) (ENSURE ENLIVE) liquid 237 mL  237 mL Oral BID BM Katha Hamming, MD   237 mL at 07/13/16 1051  . insulin aspart (novoLOG) injection 0-15 Units  0-15 Units Subcutaneous Q4H Katha Hamming, MD   2 Units at 07/13/16 0609  . insulin glargine (LANTUS) injection 10 Units  10 Units Subcutaneous QHS Srikar Sudini, MD      . iopamidol (ISOVUE-300) 61 % injection 30 mL  30 mL Oral Once Mick Sell, MD      . morphine 4 MG/ML injection 2-4 mg  2-4 mg Intravenous Q4H PRN Arnaldo Natal, MD   2 mg at 07/13/16 1053  . ondansetron (ZOFRAN) tablet 4 mg  4 mg Oral Q6H PRN Oralia Manis, MD       Or  . ondansetron [facility]) injection 4 mg  4 mg Intravenous Q6H PRN Oralia Manis, MD      . pantoprazole (PROTONIX) EC tablet 40 mg  40 mg Oral Daily Oralia Manis, MD   40 mg at 07/13/16 1052  . piperacillin-tazobactam (ZOSYN) IVPB 3.375 g  3.375 g Intravenous Q8H Leafy Ro, MD   3.375 g at 07/13/16 0544  . tamsulosin (FLOMAX) capsule 0.4 mg  0.4 mg Oral Daily Oralia Manis, MD   0.4 mg at 07/13/16 1051  . vancomycin (VANCOCIN) IVPB 1000 mg/200 mL premix  1,000 mg Intravenous Q36H Leafy Ro, MD         Discharge Medications: Please see discharge summary for a list of discharge medications.  Relevant Imaging Results:  Relevant Lab Results:   Additional Information SSN [id number]  Darleene Cleaver, [place]

## 2016-07-13 NOTE — Progress Notes (Signed)
[employer] - [place] [facility]   PATIENT NAME: Damon [name]    MR#:  [id number]  DATE OF BIRTH:  03/16/1951    CHIEF COMPLAINT:   Chief Complaint  Patient presents with  . Hyperglycemia   Patient is a poor historian. Wife at bedside. No pain. Sacral ulcer InD and Excision 07/12/2016  REVIEW OF SYSTEMS:    Review of Systems  Constitutional: Positive for fever and malaise/fatigue. Negative for chills.  HENT: Negative for sore throat.   Eyes: Negative for blurred vision, double vision and pain.  Respiratory: Negative for cough, hemoptysis, shortness of breath and wheezing.   Cardiovascular: Negative for chest pain, palpitations, orthopnea and leg swelling.  Gastrointestinal: Negative for abdominal pain, constipation, diarrhea, heartburn, nausea and vomiting.  Genitourinary: Negative for dysuria and hematuria.  Musculoskeletal: Negative for back pain and joint pain.  Skin: Negative for rash.  Neurological: Negative for sensory change, speech change, focal weakness and headaches.  Endo/Heme/Allergies: Does not bruise/bleed easily.  Psychiatric/Behavioral: Negative for depression. The patient is not nervous/anxious.     DRUG ALLERGIES:  No Known Allergies  VITALS:  Blood pressure (!) 103/46, pulse 72, temperature 97.5 F (36.4 C), temperature source Oral, resp. rate 18, height  (1.676 m), weight 64.9 kg (143 lb), SpO2 100 %.  PHYSICAL EXAMINATION:   Physical Exam  GENERAL:  66 y.o.-year-old patient lying in the bed with no acute distress.  EYES: Pupils equal, round, reactive to light and accommodation. No scleral icterus. Extraocular muscles intact.  HEENT: Head atraumatic, normocephalic. Oropharynx and nasopharynx clear.  NECK:  Supple, no jugular venous distention. No thyroid enlargement, no tenderness.  LUNGS: Normal breath sounds bilaterally, no wheezing, rales, rhonchi. No use of accessory muscles of respiration.  CARDIOVASCULAR: S1, S2 normal.  No murmurs, rubs, or gallops.  ABDOMEN: Soft, nontender, nondistended. Bowel sounds present. No organomegaly or mass.  EXTREMITIES: Crusting and ulcers on the left foot. Right below-knee amputation. Patient has left foot plantar first metatarsophalangeal joint has hyperkeratotic lesion without any open ulcer. NEUROLOGIC: Cranial nerves II through XII are intact. No focal Motor or sensory deficits b/l.   PSYCHIATRIC: The patient is alert and awake SKIN: No obvious rash, lesion, or ulcer.  Sacral decubitus ulcer with clean granulation tissue,stage 3  LABORATORY PANEL:   CBC  Recent Labs Lab 07/13/16 0400  WBC 15.1*  HGB 7.7*  HCT 23.3*  PLT 261   ------------------------------------------------------------------------------------------------------------------ Chemistries   Recent Labs Lab 07/07/16 2207  07/10/16 0409  07/13/16 0400  NA 135  < >  --   < > 137  K 3.8  < >  --   < > 3.2*  CL 106  < >  --   < > 107  CO2 22  < >  --   < > 21*  GLUCOSE 477*  < >  --   < > 145*  BUN 36*  < >  --   < > 69*  CREATININE 1.33*  < > 1.63*  < > 2.50*  CALCIUM 8.4*  < >  --   < > 7.9*  MG  --   --  1.8  --   --   AST 84*  --   --   --   --   ALT 91*  --   --   --   --   ALKPHOS 87  --   --   --   --   BILITOT 0.4  --   --   --   --   < > =  values in this interval not displayed. ------------------------------------------------------------------------------------------------------------------  Cardiac Enzymes No results for input(s): TROPONINI in the last 168 hours. ------------------------------------------------------------------------------------------------------------------  RADIOLOGY:  Ct Abdomen Pelvis Wo Contrast  Result Date: 07/12/2016 CLINICAL DATA:  Patient with history of sepsis of unknown etiology. Sacral ulceration. EXAM: CT ABDOMEN AND PELVIS WITHOUT CONTRAST TECHNIQUE: Multidetector CT imaging of the abdomen and pelvis was performed following the standard protocol  without IV contrast. COMPARISON:  Chest CT 07/09/2016; CT abdomen pelvis 05/29/2014. FINDINGS: Lower chest: Normal heart size. Trace pericardial fluid. Small bilateral layering pleural effusions. Dependent atelectasis. No pneumothorax. Hepatobiliary: The liver is normal in size and contour. Gallbladder is unremarkable. Pancreas: Unremarkable Spleen: Unremarkable Adrenals/Urinary Tract: Normal adrenal glands. 2 mm nonobstructing stone interpolar region right kidney. Urinary bladder is unremarkable. Stomach/Bowel: No abnormal bowel wall thickening or evidence for bowel obstruction. No free fluid or free intraperitoneal air. Normal morphology of the stomach. Small hiatal hernia. Vascular/Lymphatic: Normal caliber abdominal aorta. Peripheral calcified atherosclerotic plaque. Stents within the iliac arteries bilaterally. Reproductive: Prostate unremarkable. Other: Diffuse anasarca. Diffuse stranding throughout the mesentery within the abdomen. Musculoskeletal: Soft tissue ulceration is demonstrated overlying the inferior aspect of the sacrum. There is a fluid and gas collection underlying the sacral decubitus ulcer (image 84; series 2) measuring up to 2.8 cm. Additionally, foci of gas and fluid (approximately 15 cm in length) are demonstrated coursing along the left gluteal musculature laterally to the level of the left femur (image 90; series 2). More inferiorly there is loss of fat plane between this collection and the rectum (image 93; series 2). No definite frank osseous destruction of the underlying sacrum and coccyx. IMPRESSION: Decubitus ulcer demonstrated overlying the left hemi sacrum. Underlying the decubitus is a fluid and gas collection which extends laterally along the gluteal muscular towards the level of the left femur. 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 Additionally, more inferiorly from the above described collection there is loss of fat plane between collection and rectum. Fistulous  connection between the rectum and this collection is not excluded. Diffuse anasarca and third spacing of fluid. Small bilateral pleural effusions and underlying opacities favored to represent atelectasis. 2 mm nonobstructing stone interpolar region right kidney. Aortic atherosclerosis. These results were called by telephone at the time of interpretation on 07/12/2016 at 7:25 am to Dr. Clydie Braun , who verbally acknowledged these results. Electronically Signed   By: Annia Belt M.D.   On: 07/12/2016 07:35     ASSESSMENT AND PLAN:   * Clostridium bacteremia. CT scan of the abdomen shows sacral abscess. Likely source On Unasyn, Clindamycin and Vancomycin  * Incision and drainage of complex Sacral abscess, Excisional debridement of skin subcutaneous tissue, muscle and bone measuring 15 x 14 cms - 07/12/2016 Appreciate surgery help  * Sepsis POA  * Stage III sacral decubiti Wound care team following   * Worsening anemia of chronic disease. Transfused 1 unit packed RBC.  * Chronic systolic CHF with ejection fraction of 25-30%. Stable. No signs of fluid overload.  patient had drug-eluting stent in OM in oct 2017.   * Diabetes mellitus, uncontrolled with Hyper and hypoglycemia Presently patient is nothing by mouth for possible surgery. We'll start D5 normal saline. Reduce Lantus to 10 units.  * DVT prophylaxis with Lovenox.  All the records are reviewed and case discussed with Care Management/Social Worker. Management plans discussed with the patient, family and they are in agreement.  CODE STATUS: FULL CODE  DVT Prophylaxis: SCDs  TOTAL TIME TAKING CARE OF  THIS PATIENT: 35 minutes.   Milagros Loll R M.D on 07/13/2016 at 2:29 PM  Between 7am to 6pm - Pager - [phone]  After 6pm go to [email or web address] - password [email or web address] [facility]  [facility]  Office  [phone]  CC: Primary care physician; Emogene Morgan, MD  Note: This dictation was prepared with Dragon dictation  along with smaller phrase technology. Any transcriptional errors that result from this process are unintentional.

## 2016-07-13 NOTE — Progress Notes (Signed)
Occupational Therapy Treatment Patient Details Name: Damon [name] MRN: [id number] DOB: [birth year] Today's Date: 07/13/2016    History of present illness pt is a 66 yo male who came to [facility] from [facility] on 07/08/16 w/ fever and hyperglycemia, and non-infeted ulcers on the L foot that was assessed by Podiatry (07/08/16). Diagnosed w/ sepsis due to unsepecified organism. 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 PMH includes; CAD, CHF, CKD, COPD, DM, HTN, PAD, ischemic cardiomyopathy, LBBB, and diverticulosis.  He is to have abdominal CT 07-12-16.    OT comments  Pt seen to review energy conservation tech while sitting up in bed and working on bed mobility with education to relieve pressure to sacral decub while lying in bed.  Spoke to NSG since pt was asking for more morphine and food since he was hungry.  He tolerated BUE exercises with an without over head trapeze with rest breaks and cues for breathing and counting.  Pt more alert today but stamina still very limited.    Follow Up Recommendations  SNF    Equipment Recommendations       Recommendations for Other Services      Precautions / Restrictions Precautions Precautions: Fall Precaution Comments: sacral decub Other Brace/Splint: Ortho wedge on the L Foot during weightbearing  Restrictions Weight Bearing Restrictions: No       Mobility Bed Mobility                  Transfers                      Balance                                           ADL either performed or assessed with clinical judgement   ADL Overall ADL's : Needs assistance/impaired                                       General ADL Comments: Pt seen to review energy conservation tech while sitting up in bed and working on bed mobility with education to relieve pressure to sacral decub while lying in bed.  Spoke to NSG since pt was asking for more morphine and food since he was hungry.  He  tolerated BUE exercises with an without over head trapeze with rest breaks and cues for breathing and counting.  Pt more alert today but stamina still very limited.       Vision       Perception     Praxis      Cognition Arousal/Alertness: Awake/alert Behavior During Therapy: Flat affect Overall Cognitive Status: Within Functional Limits for tasks assessed                                          Exercises     Shoulder Instructions       General Comments      Pertinent Vitals/ Pain       Pain Assessment: Faces Faces Pain Scale: Hurts little more Pain Location: lower back and buttocks Pain Descriptors / Indicators: Discomfort Pain Intervention(s): Limited activity within patient's tolerance;Patient requesting pain meds-RN notified;Repositioned  Home Living                                          Prior Functioning/Environment              Frequency  Min 1X/week        Progress Toward Goals  OT Goals(current goals can now be found in the care plan section)  Progress towards OT goals: Progressing toward goals  Acute Rehab OT Goals Patient Stated Goal: To get stronger  OT Goal Formulation: With patient/family Time For Goal Achievement: 07/26/16 Potential to Achieve Goals: Good  Plan Discharge plan remains appropriate    Co-evaluation                 End of Session    OT Visit Diagnosis: Muscle weakness (generalized) (M62.81);Unsteadiness on feet (R26.81)   Activity Tolerance Patient limited by pain   Patient Left in bed;with call bell/phone within reach;with bed alarm set   Nurse Communication Other (comment);Patient requests pain meds (pad had orange red liquid on it and NSG indicated it was betadine )        Time: 1610-9604 OT Time Calculation (min): 30 min  Charges: OT General Charges $OT Visit: 1 Procedure OT Treatments $Self Care/Home Management : 8-22 mins $Therapeutic Exercise: 8-22  mins  Susanne Borders, OTR/L ascom [phone] 07/13/16, 3:44 PM

## 2016-07-13 NOTE — Progress Notes (Signed)
Bladder scanned patient again, showed 420 mL. Will continue to monitor patient's urine output and pass along instructions per Dr. Elpidio Anis to night shift RN.

## 2016-07-13 NOTE — Progress Notes (Signed)
Patient has had 0 mL urine output this shift, bladder scan showed 387 mL urine in bladder. Dr. Elpidio Anis made aware. Instructed to give patient some more time. Condom cath in place at this time. If bladder scan volume becomes > 500 mL, received verbal orders to straight cath once. If urinary retention develops again, patient may need foley catheter.

## 2016-07-13 NOTE — Care Management (Signed)
At present patient's care needs can not be met in the home.  Had  I/D of sacral abscess 4/5. It is possible patient will need long term IV antibiotics. Medicaid/disability application has been initiated by financial counselors. CSW is making attempts to find facility that will accept patient as he is not able to return to [place] due to lack of insurance

## 2016-07-14 LAB — BASIC METABOLIC PANEL
Anion gap: 8 (ref 5–15)
BUN: 79 mg/dL — AB (ref 6–20)
CO2: 20 mmol/L — AB (ref 22–32)
Calcium: 7.5 mg/dL — ABNORMAL LOW (ref 8.9–10.3)
Chloride: 108 mmol/L (ref 101–111)
Creatinine, Ser: 3.13 mg/dL — ABNORMAL HIGH (ref 0.61–1.24)
GFR calc Af Amer: 23 mL/min — ABNORMAL LOW (ref >60)
GFR, EST NON AFRICAN AMERICAN: 20 mL/min — AB (ref >60)
GLUCOSE: 133 mg/dL — AB (ref 65–99)
POTASSIUM: 3.7 mmol/L (ref 3.5–5.1)
Sodium: 136 mmol/L (ref 135–145)

## 2016-07-14 LAB — CBC WITH DIFFERENTIAL/PLATELET
BASOS ABS: 0 10*3/uL (ref 0–0.1)
Basophils Relative: 0 %
Eosinophils Absolute: 0.2 10*3/uL (ref 0–0.7)
Eosinophils Relative: 1 %
HEMATOCRIT: 23 % — AB (ref 40.0–52.0)
Hemoglobin: 7.6 g/dL — ABNORMAL LOW (ref 13.0–18.0)
LYMPHS ABS: 1 10*3/uL (ref 1.0–3.6)
LYMPHS PCT: 6 %
MCH: 30 pg (ref 26.0–34.0)
MCHC: 33 g/dL (ref 32.0–36.0)
MCV: 90.8 fL (ref 80.0–100.0)
MONO ABS: 0.4 10*3/uL (ref 0.2–1.0)
MONOS PCT: 3 %
NEUTROS ABS: 15.2 10*3/uL — AB (ref 1.4–6.5)
Neutrophils Relative %: 90 %
Platelets: 276 10*3/uL (ref 150–440)
RBC: 2.53 MIL/uL — ABNORMAL LOW (ref 4.40–5.90)
RDW: 16.8 % — ABNORMAL HIGH (ref 11.5–14.5)
WBC: 16.8 10*3/uL — ABNORMAL HIGH (ref 3.8–10.6)

## 2016-07-14 LAB — GLUCOSE, CAPILLARY
GLUCOSE-CAPILLARY: 204 mg/dL — AB (ref 65–99)
GLUCOSE-CAPILLARY: 230 mg/dL — AB (ref 65–99)
GLUCOSE-CAPILLARY: 260 mg/dL — AB (ref 65–99)
Glucose-Capillary: 149 mg/dL — ABNORMAL HIGH (ref 65–99)
Glucose-Capillary: 124 mg/dL — ABNORMAL HIGH (ref 65–99)
Glucose-Capillary: 226 mg/dL — ABNORMAL HIGH (ref 65–99)

## 2016-07-14 LAB — CK: CK TOTAL: 63 U/L (ref 49–397)

## 2016-07-14 MED ORDER — SODIUM CHLORIDE 0.9 % IV SOLN
3.0000 g | Freq: Two times a day (BID) | INTRAVENOUS | Status: DC
  Administered 2016-07-14 – 2016-07-20 (×12): 3 g via INTRAVENOUS
  Filled 2016-07-14 (×14): qty 3

## 2016-07-14 MED ORDER — HEPARIN SODIUM (PORCINE) 5000 UNIT/ML IJ SOLN
5000.0000 [IU] | Freq: Three times a day (TID) | INTRAMUSCULAR | Status: DC
  Administered 2016-07-14 – 2016-07-20 (×17): 5000 [IU] via SUBCUTANEOUS
  Filled 2016-07-14 (×17): qty 1

## 2016-07-14 MED ORDER — MORPHINE SULFATE (PF) 4 MG/ML IV SOLN
2.0000 mg | INTRAVENOUS | Status: DC | PRN
  Administered 2016-07-14 – 2016-07-17 (×11): 2 mg via INTRAVENOUS
  Filled 2016-07-14 (×5): qty 1
  Filled 2016-07-14: qty 2
  Filled 2016-07-14 (×5): qty 1

## 2016-07-14 MED ORDER — CARVEDILOL 6.25 MG PO TABS
6.2500 mg | ORAL_TABLET | Freq: Two times a day (BID) | ORAL | Status: DC
  Administered 2016-07-15 – 2016-07-20 (×10): 6.25 mg via ORAL
  Filled 2016-07-14 (×11): qty 1

## 2016-07-14 MED ORDER — HYDROCODONE-ACETAMINOPHEN 5-325 MG PO TABS
1.0000 | ORAL_TABLET | Freq: Four times a day (QID) | ORAL | Status: DC | PRN
  Administered 2016-07-14 – 2016-07-20 (×9): 1 via ORAL
  Filled 2016-07-14 (×9): qty 1

## 2016-07-14 NOTE — Progress Notes (Signed)
Pharmacy Antibiotic Note  Damon [name] is a 66 y.o. male admitted on 07/07/2016 with wound infection.  Pharmacy has been consulted for ampicillin/sulbactam dosing.  Vancomycin and piperacillin/tazobactam are being discontinued and antibiotics changed to ampicillin/sulbactam. ID following.  Plan: Ampicillin/sulbactam 3 g IV q12h based on renal function  Height:  (167.6 cm) Weight: 151 lb (68.5 kg) IBW/kg (Calculated) : 63.8  Temp (24hrs), Avg:98 F (36.7 C), Min:97.5 F (36.4 C), Max:98.4 F (36.9 C)   Recent Labs Lab 07/07/16 2207  07/09/16 0348 07/10/16 0409 07/11/16 0227 07/12/16 1305 07/13/16 0400 07/14/16 0458  WBC 14.4*  < > 16.5*  --  19.1* 16.2* 15.1* 16.8*  CREATININE 1.33*  < > 1.41* 1.63* 1.90* 2.54* 2.50* 3.13*  LATICACIDVEN 1.4  --   --   --   --   --   --   --   VANCOTROUGH  --   --   --   --  19  --   --   --   < > = values in this interval not displayed.  Estimated Creatinine Clearance: 21.5 mL/min (A) (by C-G formula based on SCr of 3.13 mg/dL (H)).    No Known Allergies  Antimicrobials this admission: vancomycin 3/31 >> 4/7 Piperacillin/tazobactam 3/31 >> 4/7 Ampicillin/sulbactam 4/4 >> 4/5 and 4/7 >>  Thank you for allowing pharmacy to be a part of this patient's care.  Cindi Carbon, PharmD Clinical Pharmacist 07/14/2016 8:43 AM

## 2016-07-14 NOTE — Evaluation (Signed)
Physical Therapy Evaluation Patient Details Name: Damon [name] MRN: [id number] DOB: 1950/10/14 Today's Date: 07/14/2016   History of Present Illness  66 yo male readmitted from [facility] 07/08/16 w/ fever and hyperglycemia, and non-infected ulcers on the L foot. Sepsis from sacral wound, recent R BK amputation, now had I and D of sacral wound. PMH includes; CAD, CHF, CKD, COPD, DM, HTN, PAD, ischemic cardiomyopathy, LBBB, and diverticulosis.   Clinical Impression  Pt is weak today, resisting efforts to sit up bedside.  He is joined by his wife during the evaluation and spoke with her about goals of therapy.  He is still appropriate to return to SNF for strengthening as he is less capable than the evaluation before the surgery.  Will focus acutely on strength, sitting balance and transfers, progressing to steps only if he is strong enough to do this.    Follow Up Recommendations SNF    Equipment Recommendations  None recommended by PT    Recommendations for Other Services OT consult     Precautions / Restrictions Precautions Precautions: Fall Precaution Comments: sacral decub Required Braces or Orthoses: Other Brace/Splint Other Brace/Splint: Ortho wedge on the L Foot during weightbearing  Restrictions Weight Bearing Restrictions: No Other Position/Activity Restrictions: Has not walked since BK amputation RLE      Mobility  Bed Mobility Overal bed mobility: Needs Assistance Bed Mobility: Supine to Sit;Sit to Supine     Supine to sit: Max assist Sit to supine: Max assist   General bed mobility comments: complete help to sit up and return to bed, dependent to scoot up bed  Transfers                 General transfer comment: unable to stand up due to weakness and pt resisting forward sitting bedside   Ambulation/Gait                Stairs            Wheelchair Mobility    Modified Rankin (Stroke Patients Only)       Balance Overall balance assessment:  Needs assistance Sitting-balance support: Feet supported;Bilateral upper extremity supported Sitting balance-Leahy Scale: Poor                                       Pertinent Vitals/Pain Pain Assessment: Faces Faces Pain Scale: Hurts little more Pain Location: sacral wound Pain Descriptors / Indicators: Operative site guarding Pain Intervention(s): Limited activity within patient's tolerance;Monitored during session;Premedicated before session;Repositioned    Home Living                        Prior Function Level of Independence: Needs assistance   Gait / Transfers Assistance Needed: assisted bed to chair transfers since BK ampuatation  ADL's / Homemaking Assistance Needed: wife cares for him, family assisting to drive them  Comments: Has been SNF resident until this surgery for sacrum, prev home with wife      Hand Dominance   Dominant Hand: Right    Extremity/Trunk Assessment   Upper Extremity Assessment Upper Extremity Assessment: Generalized weakness    Lower Extremity Assessment Lower Extremity Assessment: Generalized weakness RLE Deficits / Details: generally weak, not able to touch amp due to sensitivity LLE Deficits / Details: generally 3 to 4- strength LLE, limited focus of use LLE Sensation: decreased light touch    Cervical /  Trunk Assessment Cervical / Trunk Assessment: Normal  Communication   Communication: HOH  Cognition Arousal/Alertness: Awake/alert Behavior During Therapy: Flat affect Overall Cognitive Status: Within Functional Limits for tasks assessed                                        General Comments      Exercises Other Exercises Other Exercises: AROM to B knees and hips but very weak, assisted effort   Assessment/Plan    PT Assessment Patient needs continued PT services  PT Problem List Decreased strength;Decreased range of motion;Decreased activity tolerance;Decreased balance;Decreased  mobility;Decreased coordination;Decreased knowledge of use of DME;Decreased safety awareness;Cardiopulmonary status limiting activity;Impaired sensation;Decreased skin integrity;Pain       PT Treatment Interventions DME instruction;Gait training;Functional mobility training;Therapeutic activities;Therapeutic exercise;Balance training;Neuromuscular re-education;Patient/family education    PT Goals (Current goals can be found in the Care Plan section)  Acute Rehab PT Goals Patient Stated Goal: To get stronger  PT Goal Formulation: With patient/family Time For Goal Achievement: 07/28/16 Potential to Achieve Goals: Fair    Frequency Min 2X/week   Barriers to discharge Inaccessible home environment;Decreased caregiver support wife cannot care for him alone    Co-evaluation               End of Session Equipment Utilized During Treatment: Other (comment) (ortho wedge boot) Activity Tolerance: Patient limited by fatigue Patient left: in bed;with call bell/phone within reach;with bed alarm set;Other (comment);with family/visitor present Nurse Communication: Mobility status PT Visit Diagnosis: Muscle weakness (generalized) (M62.81)    Time: 1610-9604 PT Time Calculation (min) (ACUTE ONLY): 28 min   Charges:   PT Evaluation $PT Eval Moderate Complexity: 1 Procedure PT Treatments $Therapeutic Activity: 8-22 mins   PT G Codes:   PT G-Codes **NOT FOR INPATIENT CLASS** Functional Assessment Tool Used: AM-PAC 6 Clicks Basic Mobility     Ivar Drape 07/14/2016, 3:58 PM   Samul Dada, PT MS Acute Rehab Dept. Number: [facility] [id number] and [facility] [phone]

## 2016-07-14 NOTE — Progress Notes (Signed)
[facility] - [place] at [facility]   PATIENT NAME: Damon [name]    MR#:  [id number]  DATE OF BIRTH:  13-May-1950    CHIEF COMPLAINT:   Chief Complaint  Patient presents with  . Hyperglycemia   Patient is a poor historian with flat affect Wife at bedside. Pain surgical site Sacral ulcer InD and Excision 07/12/2016  REVIEW OF SYSTEMS:    Review of Systems  Constitutional: Positive for fever and malaise/fatigue. Negative for chills.  HENT: Negative for sore throat.   Eyes: Negative for blurred vision, double vision and pain.  Respiratory: Negative for cough, hemoptysis, shortness of breath and wheezing.   Cardiovascular: Negative for chest pain, palpitations, orthopnea and leg swelling.  Gastrointestinal: Negative for abdominal pain, constipation, diarrhea, heartburn, nausea and vomiting.  Genitourinary: Negative for dysuria and hematuria.  Musculoskeletal: Negative for back pain and joint pain.  Skin: Negative for rash.  Neurological: Negative for sensory change, speech change, focal weakness and headaches.  Endo/Heme/Allergies: Does not bruise/bleed easily.  Psychiatric/Behavioral: Negative for depression. The patient is not nervous/anxious.     DRUG ALLERGIES:  No Known Allergies  VITALS:  Blood pressure (!) 92/49, pulse 63, temperature 98 F (36.7 C), temperature source Oral, resp. rate 16, height  (1.676 m), weight 68.5 kg (151 lb), SpO2 100 %.  PHYSICAL EXAMINATION:   Physical Exam  GENERAL:  66 y.o.-year-old patient lying in the bed with no acute distress.  EYES: Pupils equal, round, reactive to light and accommodation. No scleral icterus. Extraocular muscles intact.  HEENT: Head atraumatic, normocephalic. Oropharynx and nasopharynx clear.  NECK:  Supple, no jugular venous distention. No thyroid enlargement, no tenderness.  LUNGS: Normal breath sounds bilaterally, no wheezing, rales, rhonchi. No use of accessory muscles of respiration.   CARDIOVASCULAR: S1, S2 normal. No murmurs, rubs, or gallops.  ABDOMEN: Soft, nontender, nondistended. Bowel sounds present. No organomegaly or mass.  EXTREMITIES: Crusting and ulcers on the left foot. Right below-knee amputation. Patient has left foot plantar first metatarsophalangeal joint has hyperkeratotic lesion without any open ulcer. NEUROLOGIC: Cranial nerves II through XII are intact. No focal Motor or sensory deficits b/l.   PSYCHIATRIC: The patient is alert and awake. Flat affect SKIN: No obvious rash, lesion, or ulcer.  Sacral decubitus ulcer with clean granulation tissue,stage 3  LABORATORY PANEL:   CBC  Recent Labs Lab 07/14/16 0458  WBC 16.8*  HGB 7.6*  HCT 23.0*  PLT 276   ------------------------------------------------------------------------------------------------------------------ Chemistries   Recent Labs Lab 07/07/16 2207  07/10/16 0409  07/14/16 0458  NA 135  < >  --   < > 136  K 3.8  < >  --   < > 3.7  CL 106  < >  --   < > 108  CO2 22  < >  --   < > 20*  GLUCOSE 477*  < >  --   < > 133*  BUN 36*  < >  --   < > 79*  CREATININE 1.33*  < > 1.63*  < > 3.13*  CALCIUM 8.4*  < >  --   < > 7.5*  MG  --   --  1.8  --   --   AST 84*  --   --   --   --   ALT 91*  --   --   --   --   ALKPHOS 87  --   --   --   --  BILITOT 0.4  --   --   --   --   < > = values in this interval not displayed. ------------------------------------------------------------------------------------------------------------------  Cardiac Enzymes No results for input(s): TROPONINI in the last 168 hours. ------------------------------------------------------------------------------------------------------------------  RADIOLOGY:  No results found.   ASSESSMENT AND PLAN:   * AKI over CKD3 Started IVF. No improvement Monitor I/Os He did have some urinary retnetion yesterday but that is improved. No hydronephrosis on CT abd Consult Nephrology  * Clostridium bacteremia.  CT scan of the abdomen shows sacral abscess. Likely source On Unasyn as per ID  * Incision and drainage of complex Sacral abscess, Excisional debridement of skin subcutaneous tissue, muscle and bone measuring 15 x 14 cms - 07/12/2016 Appreciate surgery help  * Sepsis POA   * Worsening anemia of chronic disease. Transfused 1 unit packed RBC. MOnitor  * Chronic systolic CHF with ejection fraction of 25-30%. Stable. No signs of fluid overload.  patient had drug-eluting stent in OM in oct 2017.   * Diabetes mellitus, uncontrolled with Hyper and hypoglycemia BS doing well on 10 units lantus and SSI  * HTN Has been low normal with BP Stop BP meds Lisinopril, Hydralazine and coreg  * DVT prophylaxis with Lovenox.  All the records are reviewed and case discussed with Care Management/Social Worker. Management plans discussed with the patient, family and they are in agreement.  CODE STATUS: FULL CODE  DVT Prophylaxis: SCDs  TOTAL TIME TAKING CARE OF THIS PATIENT: 35 minutes.   Milagros Loll R M.D on 07/14/2016 at 10:50 AM  Between 7am to 6pm - Pager - [phone]  After 6pm go to [email or web address] - password [email or web address] [facility]  [facility] [place] [facility]  Office  [phone]  CC: Primary care physician; Emogene Morgan, MD  Note: This dictation was prepared with Dragon dictation along with smaller phrase technology. Any transcriptional errors that result from this process are unintentional.

## 2016-07-15 ENCOUNTER — Inpatient Hospital Stay: Payer: Commercial Managed Care - PPO

## 2016-07-15 LAB — CBC
HCT: 21.5 % — ABNORMAL LOW (ref 40.0–52.0)
Hemoglobin: 7 g/dL — ABNORMAL LOW (ref 13.0–18.0)
MCH: 29.2 pg (ref 26.0–34.0)
MCHC: 32.4 g/dL (ref 32.0–36.0)
MCV: 90 fL (ref 80.0–100.0)
PLATELETS: 288 10*3/uL (ref 150–440)
RBC: 2.39 MIL/uL — ABNORMAL LOW (ref 4.40–5.90)
RDW: 17.4 % — AB (ref 11.5–14.5)
WBC: 13.1 10*3/uL — AB (ref 3.8–10.6)

## 2016-07-15 LAB — GLUCOSE, CAPILLARY
GLUCOSE-CAPILLARY: 164 mg/dL — AB (ref 65–99)
GLUCOSE-CAPILLARY: 199 mg/dL — AB (ref 65–99)
GLUCOSE-CAPILLARY: 217 mg/dL — AB (ref 65–99)
GLUCOSE-CAPILLARY: 246 mg/dL — AB (ref 65–99)
Glucose-Capillary: 249 mg/dL — ABNORMAL HIGH (ref 65–99)
Glucose-Capillary: 269 mg/dL — ABNORMAL HIGH (ref 65–99)

## 2016-07-15 LAB — BASIC METABOLIC PANEL
Anion gap: 8 (ref 5–15)
BUN: 87 mg/dL — AB (ref 6–20)
CO2: 18 mmol/L — ABNORMAL LOW (ref 22–32)
CREATININE: 3.76 mg/dL — AB (ref 0.61–1.24)
Calcium: 7.4 mg/dL — ABNORMAL LOW (ref 8.9–10.3)
Chloride: 107 mmol/L (ref 101–111)
GFR calc Af Amer: 18 mL/min — ABNORMAL LOW (ref >60)
GFR, EST NON AFRICAN AMERICAN: 16 mL/min — AB (ref >60)
Glucose, Bld: 231 mg/dL — ABNORMAL HIGH (ref 65–99)
Potassium: 4.3 mmol/L (ref 3.5–5.1)
SODIUM: 133 mmol/L — AB (ref 135–145)

## 2016-07-15 MED ORDER — LINEZOLID 600 MG/300ML IV SOLN
600.0000 mg | Freq: Two times a day (BID) | INTRAVENOUS | Status: DC
  Administered 2016-07-15 – 2016-07-20 (×10): 600 mg via INTRAVENOUS
  Filled 2016-07-15 (×14): qty 300

## 2016-07-15 NOTE — Plan of Care (Signed)
Problem: Skin Integrity: Goal: Risk for impaired skin integrity will decrease Outcome: Progressing Dressing changes on coccyx wound twice daily, turn/repositioning every two hours, head of bed less than 30 degrees. Patient tolerating interventions well.

## 2016-07-15 NOTE — Progress Notes (Signed)
[facility] - [place] at [facility]   PATIENT NAME: Damon [name]    MR#:  [id number]  DATE OF BIRTH:  [birth year]    CHIEF COMPLAINT:   Chief Complaint  Patient presents with  . Hyperglycemia   Patient is a poor historian with flat affect Wife at bedside. Pain surgical site Sacral ulcer InD and Excision 07/12/2016  REVIEW OF SYSTEMS:    Review of Systems  Constitutional: Positive for fever and malaise/fatigue. Negative for chills.  HENT: Negative for sore throat.   Eyes: Negative for blurred vision, double vision and pain.  Respiratory: Negative for cough, hemoptysis, shortness of breath and wheezing.   Cardiovascular: Negative for chest pain, palpitations, orthopnea and leg swelling.  Gastrointestinal: Negative for abdominal pain, constipation, diarrhea, heartburn, nausea and vomiting.  Genitourinary: Negative for dysuria and hematuria.  Musculoskeletal: Negative for back pain and joint pain.  Skin: Negative for rash.  Neurological: Negative for sensory change, speech change, focal weakness and headaches.  Endo/Heme/Allergies: Does not bruise/bleed easily.  Psychiatric/Behavioral: Negative for depression. The patient is not nervous/anxious.     DRUG ALLERGIES:  No Known Allergies  VITALS:  Blood pressure (!) 124/56, pulse 72, temperature 98.5 F (36.9 C), temperature source Oral, resp. rate 18, height  (1.676 m), weight 68.2 kg (150 lb 5.7 oz), SpO2 100 %.  PHYSICAL EXAMINATION:   Physical Exam  GENERAL:  66 y.o.-year-old patient lying in the bed with no acute distress.  EYES: Pupils equal, round, reactive to light and accommodation. No scleral icterus. Extraocular muscles intact.  HEENT: Head atraumatic, normocephalic. Oropharynx and nasopharynx clear.  NECK:  Supple, no jugular venous distention. No thyroid enlargement, no tenderness.  LUNGS: Normal breath sounds bilaterally, no wheezing, rales, rhonchi. No use of accessory muscles of respiration.   CARDIOVASCULAR: S1, S2 normal. No murmurs, rubs, or gallops.  ABDOMEN: Soft, nontender, nondistended. Bowel sounds present. No organomegaly or mass.  EXTREMITIES: Crusting and ulcers on the left foot. Right below-knee amputation. Patient has left foot plantar first metatarsophalangeal joint has hyperkeratotic lesion without any open ulcer. NEUROLOGIC: Cranial nerves II through XII are intact. No focal Motor or sensory deficits b/l.   PSYCHIATRIC: The patient is alert and awake. Flat affect SKIN: No obvious rash, lesion, or ulcer.  Sacral decubitus ulcer with clean granulation tissue,stage 3  LABORATORY PANEL:   CBC  Recent Labs Lab 07/15/16 0714  WBC 13.1*  HGB 7.0*  HCT 21.5*  PLT 288   ------------------------------------------------------------------------------------------------------------------ Chemistries   Recent Labs Lab 07/10/16 0409  07/15/16 0714  NA  --   < > 133*  K  --   < > 4.3  CL  --   < > 107  CO2  --   < > 18*  GLUCOSE  --   < > 231*  BUN  --   < > 87*  CREATININE 1.63*  < > 3.76*  CALCIUM  --   < > 7.4*  MG 1.8  --   --   < > = values in this interval not displayed. ------------------------------------------------------------------------------------------------------------------  Cardiac Enzymes No results for input(s): TROPONINI in the last 168 hours. ------------------------------------------------------------------------------------------------------------------  RADIOLOGY:  No results found.   ASSESSMENT AND PLAN:   * AKI over CKD3 Started IVF. No improvement Monitor I/Os He did have some urinary retnetion earlier but that is improved. No hydronephrosis on CT abd Consulted Nephrology. Discussed with Dr. Cherylann Ratel.  * Clostridium bacteremia. CT scan of the abdomen shows sacral abscess which is [employer]  source On Unasyn as per ID Final abx recommendation per ID  * Incision and drainage of complex Sacral abscess, Excisional debridement of  skin subcutaneous tissue, muscle and bone measuring 15 x 14 cms - 07/12/2016 Appreciate surgery help  * Sepsis POA   * Worsening anemia of chronic disease. Transfused 1 unit packed RBC. Monitor May need further transfusion if < 7  * Chronic systolic CHF with ejection fraction of 25-30%. Stable. No signs of fluid overload.  patient had drug-eluting stent in OM in oct 2017.   * Diabetes mellitus, uncontrolled with Hyper and hypoglycemia Now on 10 units lantus. Will increase to 12 units  * HTN Has been low normal with BP Stop BP meds Lisinopril, Hydralazine and coreg held  * DVT prophylaxis with Lovenox.  All the records are reviewed and case discussed with Care Management/Social Worker. Management plans discussed with the patient, family and they are in agreement.  CODE STATUS: FULL CODE  DVT Prophylaxis: SCDs  TOTAL TIME TAKING CARE OF THIS PATIENT: 35 minutes.   Milagros Loll R M.D on 07/15/2016 at 1:03 PM  Between 7am to 6pm - Pager - [phone]  After 6pm go to [email or web address] - password [email or web address] [facility]  [facility] [place] [facility]  Office  [phone]  CC: Primary care physician; Emogene Morgan, MD  Note: This dictation was prepared with Dragon dictation along with smaller phrase technology. Any transcriptional errors that result from this process are unintentional.

## 2016-07-15 NOTE — Progress Notes (Signed)
[facility]  ROUNDING NOTE   Subjective:  Patient well known to from the office. Baseline creatinine is 1.2. He now appears to have a complex sacral abscess with Clostridium bacteremia. Periods of relative hypotension noted as well. Underwent I and D of abscess 07/12/16.  On 07/07/2016 creatinine was 1.3. Creatinine is now risen to 3.76 with a BUN of 87.  Objective:  Vital signs in last 24 hours:  Temp:  [97.6 F (36.4 C)-98.5 F (36.9 C)] 98.5 F (36.9 C) (04/08 0816) Pulse Rate:  [72-77] 72 (04/08 0816) Resp:  [16-19] 18 (04/08 0816) BP: (108-124)/(50-56) 124/56 (04/08 0816) SpO2:  [98 %-100 %] 100 % (04/08 0816) Weight:  [68.2 kg (150 lb 5.7 oz)] 68.2 kg (150 lb 5.7 oz) (04/08 0441)  Weight change: -0.293 kg (-10.3 oz) Filed Weights   07/13/16 0527 07/14/16 0412 07/15/16 0441  Weight: 64.9 kg (143 lb) 68.5 kg (151 lb) 68.2 kg (150 lb 5.7 oz)    Intake/Output: I/O last 3 completed shifts: In: 2290 [P.O.:240; I.V.:1800; IV Piggyback:250] Out: 350 [Urine:350]   Intake/Output this shift:  Total I/O In: 373.3 [I.V.:373.3] Out: 250 [Urine:250]  Physical Exam: General: No acute distress  Head: Normocephalic, atraumatic. Moist oral mucosal membranes  Eyes: Anicteric  Neck: Supple, trachea midline  Lungs:  Clear to auscultation, normal effort  Heart: S1S2 no rubs  Abdomen:  Soft, nontender   Extremities: Right BKA  Neurologic: Nonfocal, moving all four extremities  Skin: No lesions       Basic Metabolic Panel:  Recent Labs Lab 07/10/16 0409 07/11/16 0227 07/12/16 1305 07/13/16 0400 07/14/16 0458 07/15/16 0714  NA  --  133* 134* 137 136 133*  K  --  3.3* 3.1* 3.2* 3.7 4.3  CL  --  105 105 107 108 107  CO2  --  21* 20* 21* 20* 18*  GLUCOSE  --  205* 121* 145* 133* 231*  BUN  --  56* 70* 69* 79* 87*  CREATININE 1.63* 1.90* 2.54* 2.50* 3.13* 3.76*  CALCIUM  --  7.7* 7.8* 7.9* 7.5* 7.4*  MG 1.8  --   --   --   --   --     Liver Function  Tests: No results for input(s): AST, ALT, ALKPHOS, BILITOT, PROT, ALBUMIN in the last 168 hours. No results for input(s): LIPASE, AMYLASE in the last 168 hours. No results for input(s): AMMONIA in the last 168 hours.  CBC:  Recent Labs Lab 07/09/16 0348 07/11/16 0227 07/12/16 1305 07/13/16 0400 07/14/16 0458 07/15/16 0714  WBC 16.5* 19.1* 16.2* 15.1* 16.8* 13.1*  NEUTROABS 15.0*  --  14.7* 13.7* 15.2*  --   HGB 7.7* 7.0* 7.4* 7.7* 7.6* 7.0*  HCT 23.1* 20.9* 22.3* 23.3* 23.0* 21.5*  MCV 90.5 89.1 89.2 90.3 90.8 90.0  PLT 221 256 261 261 276 288    Cardiac Enzymes:  Recent Labs Lab 07/14/16 0458  CKTOTAL 63    BNP: Invalid input(s): POCBNP  CBG:  Recent Labs Lab 07/14/16 2046 07/15/16 0010 07/15/16 0437 07/15/16 0741 07/15/16 1135  GLUCAP 226* 249* 164* 199* 217*    Microbiology: Results for orders placed or performed during the hospital encounter of 07/07/16  Blood Culture (routine x 2)     Status: Abnormal   Collection Time: 07/07/16 10:07 PM  Result Value Ref Range Status   Specimen Description BLOOD LEFT FOREARM  Final   Special Requests   Final    BOTTLES DRAWN AEROBIC AND ANAEROBIC Blood Culture adequate  volume   Culture  Setup Time   Final    GRAM POSITIVE RODS ANAEROBIC BOTTLE ONLY CRITICAL VALUE NOTED.  VALUE IS CONSISTENT WITH PREVIOUSLY REPORTED AND CALLED VALUE.    Culture (A)  Final    CLOSTRIDIUM RAMOSUM Standardized susceptibility testing for this organism is not available.    Report Status 07/11/2016 FINAL  Final  Blood Culture (routine x 2)     Status: Abnormal   Collection Time: 07/07/16 10:07 PM  Result Value Ref Range Status   Specimen Description BLOOD RIGHT HAND  Final   Special Requests   Final    BOTTLES DRAWN AEROBIC AND ANAEROBIC Blood Culture adequate volume   Culture  Setup Time   Final    Organism ID to follow GRAM POSITIVE RODS ANAEROBIC BOTTLE ONLY CRITICAL RESULT CALLED TO, READ BACK BY AND VERIFIED WITH: NATE  COOKSON AT 2046 07/08/16.SDR/PMH    Culture (A)  Final    CLOSTRIDIUM RAMOSUM Standardized susceptibility testing for this organism is not available. Performed at [facility], [address], [place], [place] [address]    Report Status 07/11/2016 FINAL  Final  Blood Culture ID Panel (Reflexed)     Status: None   Collection Time: 07/07/16 10:07 PM  Result Value Ref Range Status   Enterococcus species NOT DETECTED NOT DETECTED Final   Listeria monocytogenes NOT DETECTED NOT DETECTED Final   Staphylococcus species NOT DETECTED NOT DETECTED Final   Staphylococcus aureus NOT DETECTED NOT DETECTED Final   Streptococcus species NOT DETECTED NOT DETECTED Final   Streptococcus agalactiae NOT DETECTED NOT DETECTED Final   Streptococcus pneumoniae NOT DETECTED NOT DETECTED Final   Streptococcus pyogenes NOT DETECTED NOT DETECTED Final   Acinetobacter baumannii NOT DETECTED NOT DETECTED Final   Enterobacteriaceae species NOT DETECTED NOT DETECTED Final   Enterobacter cloacae complex NOT DETECTED NOT DETECTED Final   Escherichia coli NOT DETECTED NOT DETECTED Final   Klebsiella oxytoca NOT DETECTED NOT DETECTED Final   Klebsiella pneumoniae NOT DETECTED NOT DETECTED Final   Proteus species NOT DETECTED NOT DETECTED Final   Serratia marcescens NOT DETECTED NOT DETECTED Final   Haemophilus influenzae NOT DETECTED NOT DETECTED Final   Neisseria meningitidis NOT DETECTED NOT DETECTED Final   Pseudomonas aeruginosa NOT DETECTED NOT DETECTED Final   Candida albicans NOT DETECTED NOT DETECTED Final   Candida glabrata NOT DETECTED NOT DETECTED Final   Candida krusei NOT DETECTED NOT DETECTED Final   Candida parapsilosis NOT DETECTED NOT DETECTED Final   Candida tropicalis NOT DETECTED NOT DETECTED Final  MRSA PCR Screening     Status: None   Collection Time: 07/11/16 11:14 PM  Result Value Ref Range Status   MRSA by PCR NEGATIVE NEGATIVE Final    Comment:        The GeneXpert MRSA Assay  (FDA approved for NASAL specimens only), is one component of a comprehensive MRSA colonization surveillance program. It is not intended to diagnose MRSA infection nor to guide or monitor treatment for MRSA infections.   Aerobic/Anaerobic Culture (surgical/deep wound)     Status: None (Preliminary result)   Collection Time: 07/12/16 10:38 PM  Result Value Ref Range Status   Specimen Description [facility]  Final   Special Requests NONE  Final   Gram Stain   Final    RARE WBC PRESENT, PREDOMINANTLY PMN RARE GRAM POSITIVE COCCI IN PAIRS    Culture   Final    FEW ENTEROCOCCUS FAECIUM HOLDING FOR POSSIBLE ANAEROBE Performed at [facility]  [facility], [address] [place], [place] [address]    Report Status PENDING  Incomplete  Aerobic/Anaerobic Culture (surgical/deep wound)     Status: None (Preliminary result)   Collection Time: 07/12/16 10:39 PM  Result Value Ref Range Status   Specimen Description [facility]  Final   Special Requests NONE  Final   Gram Stain   Final    RARE WBC PRESENT, PREDOMINANTLY PMN RARE GRAM POSITIVE COCCI IN PAIRS RARE GRAM POSITIVE RODS    Culture   Final    FEW VANCOMYCIN RESISTANT ENTEROCOCCUS HOLDING FOR POSSIBLE ANAEROBE Performed at [facility], [address] [place], [place] [address]    Report Status PENDING  Incomplete   Organism ID, Bacteria VANCOMYCIN RESISTANT ENTEROCOCCUS  Final      Susceptibility   Vancomycin resistant enterococcus - MIC*    AMPICILLIN >=32 RESISTANT Resistant     VANCOMYCIN >=32 RESISTANT Resistant     GENTAMICIN SYNERGY SENSITIVE Sensitive     LINEZOLID 2 SENSITIVE Sensitive     * FEW VANCOMYCIN RESISTANT ENTEROCOCCUS    Coagulation Studies: No results for input(s): LABPROT, INR in the last 72 hours.  Urinalysis: No results for input(s): COLORURINE, LABSPEC, PHURINE, GLUCOSEU, HGBUR, BILIRUBINUR, KETONESUR, PROTEINUR, UROBILINOGEN, NITRITE, LEUKOCYTESUR in the last 72 hours.  Invalid input(s):  APPERANCEUR    Imaging: No results found.   Medications:   . 0.9 % NaCl with KCl 20 mEq / L 50 mL/hr at 07/15/16 1156   . ampicillin-sulbactam (UNASYN) IV  3 g Intravenous Q12H  . atorvastatin  80 mg Oral q1800  . carvedilol  6.25 mg Oral BID WC  . feeding supplement (ENSURE ENLIVE)  237 mL Oral BID BM  . heparin subcutaneous  5,000 Units Subcutaneous Q8H  . insulin aspart  0-15 Units Subcutaneous Q4H  . insulin glargine  10 Units Subcutaneous QHS  . iopamidol  30 mL Oral Once  . pantoprazole  40 mg Oral Daily  . sodium hypochlorite   Irrigation BID  . tamsulosin  0.4 mg Oral Daily   acetaminophen **OR** acetaminophen, albuterol, HYDROcodone-acetaminophen, morphine injection, ondansetron **OR** ondansetron (ZOFRAN) IV  Assessment/ Plan:  66 y.o. male with past medical history of cardiomyopathy with ejection pressure 30-35%, coronary artery disease status post stent placement in the LAD and OM1 branch, hypertension, peripheral arterial disease, lower extremity edema, diabetes mellitus type 2, right BKA, admitted with severe sacral decubitis ulcer now with ARF.   1. Acute renal failure/chronic kidney disease stage II/proteinuria. Baseline Cr 1.2.  -  Suspect renal function worse now secondary to infection, sepsis, and relative hypotension. Obtain renal ultrasound now to make sure there is no underlying obstruction. Continue IV fluid hydration but lower rate to 50 cc per hour.  No urgent indication for dialysis at the moment however this may be a possibility if renal function continues to worsen.   2. Hyponatremia. Very mild at the moment. Serum sodium 133. Continue 0.9 normal saline.    3. Anemia of chronic kidney disease. Hemoglobin down to 7.0. Could be postsurgical in nature as well. Recommend blood transfusion but defer this to hospitalist.  4. Sacral decubitus ulcer/abscess status post incision and drainage.  Patient remains on Unasyn for treatment. Previously was on  vancomycin.    LOS: 8 [place] 4/8/20181:18 PM

## 2016-07-16 LAB — PREPARE RBC (CROSSMATCH)

## 2016-07-16 LAB — COMPREHENSIVE METABOLIC PANEL
ALK PHOS: 70 U/L (ref 38–126)
ALT: 46 U/L (ref 17–63)
ANION GAP: 7 (ref 5–15)
AST: 35 U/L (ref 15–41)
Albumin: 1.6 g/dL — ABNORMAL LOW (ref 3.5–5.0)
BUN: 84 mg/dL — ABNORMAL HIGH (ref 6–20)
CO2: 19 mmol/L — AB (ref 22–32)
Calcium: 7.7 mg/dL — ABNORMAL LOW (ref 8.9–10.3)
Chloride: 108 mmol/L (ref 101–111)
Creatinine, Ser: 3.36 mg/dL — ABNORMAL HIGH (ref 0.61–1.24)
GFR calc non Af Amer: 18 mL/min — ABNORMAL LOW (ref >60)
GFR, EST AFRICAN AMERICAN: 21 mL/min — AB (ref >60)
GLUCOSE: 106 mg/dL — AB (ref 65–99)
Potassium: 4.6 mmol/L (ref 3.5–5.1)
SODIUM: 134 mmol/L — AB (ref 135–145)
Total Bilirubin: 0.7 mg/dL (ref 0.3–1.2)
Total Protein: 5.8 g/dL — ABNORMAL LOW (ref 6.5–8.1)

## 2016-07-16 LAB — GLUCOSE, CAPILLARY
GLUCOSE-CAPILLARY: 86 mg/dL (ref 65–99)
GLUCOSE-CAPILLARY: 79 mg/dL (ref 65–99)
GLUCOSE-CAPILLARY: 153 mg/dL — AB (ref 65–99)
Glucose-Capillary: 118 mg/dL — ABNORMAL HIGH (ref 65–99)
Glucose-Capillary: 211 mg/dL — ABNORMAL HIGH (ref 65–99)
Glucose-Capillary: 232 mg/dL — ABNORMAL HIGH (ref 65–99)

## 2016-07-16 LAB — CBC WITH DIFFERENTIAL/PLATELET
Basophils Absolute: 0 10*3/uL (ref 0–0.1)
Basophils Relative: 0 %
Eosinophils Absolute: 0.1 10*3/uL (ref 0–0.7)
Eosinophils Relative: 1 %
HEMATOCRIT: 21.7 % — AB (ref 40.0–52.0)
HEMOGLOBIN: 7.1 g/dL — AB (ref 13.0–18.0)
LYMPHS ABS: 1.1 10*3/uL (ref 1.0–3.6)
LYMPHS PCT: 10 %
MCH: 29.7 pg (ref 26.0–34.0)
MCHC: 32.5 g/dL (ref 32.0–36.0)
MCV: 91.5 fL (ref 80.0–100.0)
MONO ABS: 0.5 10*3/uL (ref 0.2–1.0)
MONOS PCT: 5 %
NEUTROS ABS: 9.5 10*3/uL — AB (ref 1.4–6.5)
Neutrophils Relative %: 84 %
Platelets: 332 10*3/uL (ref 150–440)
RBC: 2.38 MIL/uL — ABNORMAL LOW (ref 4.40–5.90)
RDW: 17.2 % — AB (ref 11.5–14.5)
WBC: 11.2 10*3/uL — ABNORMAL HIGH (ref 3.8–10.6)

## 2016-07-16 LAB — HEMOGLOBIN AND HEMATOCRIT, BLOOD
HEMATOCRIT: 27.9 % — AB (ref 40.0–52.0)
HEMOGLOBIN: 9.4 g/dL — AB (ref 13.0–18.0)

## 2016-07-16 LAB — SURGICAL PATHOLOGY

## 2016-07-16 MED ORDER — INSULIN ASPART 100 UNIT/ML ~~LOC~~ SOLN: 0.0000 [IU] | Freq: Three times a day (TID) | SUBCUTANEOUS | Status: DC

## 2016-07-16 MED ORDER — INSULIN ASPART 100 UNIT/ML IV SOLN
3.0000 [IU] | Freq: Three times a day (TID) | INTRAVENOUS | Status: DC
  Filled 2016-07-16: qty 0.03

## 2016-07-16 MED ORDER — SODIUM CHLORIDE 0.9 % IV SOLN
Freq: Once | INTRAVENOUS | Status: AC
  Administered 2016-07-16: 11:00:00 via INTRAVENOUS

## 2016-07-16 MED ORDER — INSULIN ASPART 100 UNIT/ML IV SOLN
3.0000 [IU] | Freq: Three times a day (TID) | INTRAVENOUS | Status: DC
  Administered 2016-07-16 (×2): 3 [IU] via INTRAVENOUS
  Filled 2016-07-16 (×4): qty 0.03

## 2016-07-16 MED ORDER — INSULIN ASPART 100 UNIT/ML ~~LOC~~ SOLN
0.0000 [IU] | Freq: Every day | SUBCUTANEOUS | Status: DC
  Administered 2016-07-16 – 2016-07-19 (×2): 2 [IU] via SUBCUTANEOUS
  Filled 2016-07-16 (×2): qty 2

## 2016-07-16 MED ORDER — INSULIN GLARGINE 100 UNIT/ML ~~LOC~~ SOLN
14.0000 [IU] | Freq: Every day | SUBCUTANEOUS | Status: DC
  Administered 2016-07-16 – 2016-07-19 (×4): 14 [IU] via SUBCUTANEOUS
  Filled 2016-07-16 (×5): qty 0.14

## 2016-07-16 NOTE — Progress Notes (Signed)
Sacral  wound dressing changed per order, pt tolerating well.

## 2016-07-16 NOTE — Clinical Social Work Note (Signed)
CSW spoke to patient and his wife regarding SNF placement.  CSW was given permission to give provide a 30 day LOG for patient to go to a SNF for rehab and IV antibiotics. CSW explained to patient and his wife, that the options will be very limited on where patient is able to go to.  Patient and his family understand and are in agreement to go wherever a bed is available once he is medically ready for discharge and orders have been received.  Ervin Knack. Tiyanna Larcom, MSW, [facility] [phone]  07/16/2016 4:15 PM

## 2016-07-16 NOTE — Progress Notes (Signed)
Inpatient Diabetes Program Recommendations  AACE/ADA: New Consensus Statement on Inpatient Glycemic Control (2015)  Target Ranges:  Prepandial:   less than 140 mg/dL      Peak postprandial:   less than 180 mg/dL (1-2 hours)      Critically ill patients:  140 - 180 mg/dL   Lab Results  Component Value Date   GLUCAP 86 07/16/2016   HGBA1C 7.8 (H) 07/08/2016    Review of Glycemic ControlResults for Damon [name], Damon [name] (MRN [id number]) as of 07/16/2016 10:46  Ref. Range 07/15/2016 04:37 07/15/2016 07:41 07/15/2016 11:35 07/15/2016 16:45 07/15/2016 22:00 07/16/2016 00:50 07/16/2016 04:18 07/16/2016 07:49  Glucose-Capillary Latest Ref Range: 65 - 99 mg/dL 409 (H) 811 (H) 914 (H) 269 (H) 246 (H) 232 (H) 118 (H) 86   Diabetes history: Type 2 diabetes Outpatient Diabetes medications: Lantus 14 units q HS, Metformin 500 mg bid Current orders for Inpatient glycemic control:  Novolog moderate q 4 hours, Lantus 10 units q HS  Inpatient Diabetes Program Recommendations:   Please consider: 1) Increase of Lantus to 14 units q HS.  2) Change frequency of Novolog correction to tid with meals and bedtime (HS scale) 3) Add Novolog meal coverage 3 units tid with meals (hold if patient eats less than 50%)  Thanks, Beryl Meager, RN, BC-ADM Inpatient Diabetes Coordinator Pager [phone] (8a-5p)

## 2016-07-16 NOTE — Progress Notes (Signed)
[facility]  ROUNDING NOTE   Subjective:  Brother at bedside.   Creatinine 3.36 (3.76)  UOP 850  NS with 20KCl at 35mL/hr  Objective:  Vital signs in last 24 hours:  Temp:  [98.2 F (36.8 C)-98.9 F (37.2 C)] 98.4 F (36.9 C) (04/09 0356) Pulse Rate:  [67-80] 75 (04/09 0828) Resp:  [18-22] 18 (04/09 0356) BP: (127-135)/(46-61) 127/61 (04/09 0828) SpO2:  [99 %-100 %] 100 % (04/09 0828) Weight:  [73.9 kg (162 lb 14.4 oz)] 73.9 kg (162 lb 14.4 oz) (04/09 0356)  Weight change: 5.691 kg (12 lb 8.7 oz) Filed Weights   07/15/16 0441 07/15/16 2036 07/16/16 0356  Weight: 68.2 kg (150 lb 5.7 oz) 73.9 kg (162 lb 14.4 oz) 73.9 kg (162 lb 14.4 oz)    Intake/Output: I/O last 3 completed shifts: In: 1073.3 [I.V.:673.3; IV Piggyback:400] Out: 850 [Urine:850]   Intake/Output this shift:  No intake/output data recorded.  Physical Exam: General: No acute distress  Head: Normocephalic, atraumatic. 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  Neurologic: Nonfocal, moving all four extremities  Skin: No lesions       Basic Metabolic Panel:  Recent Labs Lab 07/10/16 0409  07/12/16 1305 07/13/16 0400 07/14/16 0458 07/15/16 0714 07/16/16 0511  NA  --   < > 134* 137 136 133* 134*  K  --   < > 3.1* 3.2* 3.7 4.3 4.6  CL  --   < > 105 107 108 107 108  CO2  --   < > 20* 21* 20* 18* 19*  GLUCOSE  --   < > 121* 145* 133* 231* 106*  BUN  --   < > 70* 69* 79* 87* 84*  CREATININE 1.63*  < > 2.54* 2.50* 3.13* 3.76* 3.36*  CALCIUM  --   < > 7.8* 7.9* 7.5* 7.4* 7.7*  MG 1.8  --   --   --   --   --   --   < > = values in this interval not displayed.  Liver Function Tests:  Recent Labs Lab 07/16/16 0511  AST 35  ALT 46  ALKPHOS 70  BILITOT 0.7  PROT 5.8*  ALBUMIN 1.6*   No results for input(s):  LIPASE, AMYLASE in the last 168 hours. No results for input(s): AMMONIA in the last 168 hours.  CBC:  Recent Labs Lab 07/12/16 1305 07/13/16 0400 07/14/16 0458 07/15/16 0714 07/16/16 0511  WBC 16.2* 15.1* 16.8* 13.1* 11.2*  NEUTROABS 14.7* 13.7* 15.2*  --  9.5*  HGB 7.4* 7.7* 7.6* 7.0* 7.1*  HCT 22.3* 23.3* 23.0* 21.5* 21.7*  MCV 89.2 90.3 90.8 90.0 91.5  PLT 261 261 276 288 332    Cardiac Enzymes:  Recent Labs Lab 07/14/16 0458  CKTOTAL 63    BNP: Invalid input(s): POCBNP  CBG:  Recent Labs Lab 07/15/16 1645 07/15/16 2200 07/16/16 0050 07/16/16 0418 07/16/16 0749  GLUCAP 269* 246* 232* 118* 86    Microbiology: Results for orders placed or performed during the hospital encounter of 07/07/16  Blood Culture (routine x 2)     Status: Abnormal   Collection Time: 07/07/16 10:07 PM  Result Value Ref Range Status   Specimen Description BLOOD LEFT FOREARM  Final   Special Requests   Final    BOTTLES DRAWN AEROBIC AND  ANAEROBIC Blood Culture adequate volume   Culture  Setup Time   Final    GRAM POSITIVE RODS ANAEROBIC BOTTLE ONLY CRITICAL VALUE NOTED.  VALUE IS CONSISTENT WITH PREVIOUSLY REPORTED AND CALLED VALUE.    Culture (A)  Final    CLOSTRIDIUM RAMOSUM Standardized susceptibility testing for this organism is not available.    Report Status 07/11/2016 FINAL  Final  Blood Culture (routine x 2)     Status: Abnormal   Collection Time: 07/07/16 10:07 PM  Result Value Ref Range Status   Specimen Description BLOOD RIGHT HAND  Final   Special Requests   Final    BOTTLES DRAWN AEROBIC AND ANAEROBIC Blood Culture adequate volume   Culture  Setup Time   Final    Organism ID to follow GRAM POSITIVE RODS ANAEROBIC BOTTLE ONLY CRITICAL RESULT CALLED TO, READ BACK BY AND VERIFIED WITH: NATE COOKSON AT 2046 07/08/16.SDR/PMH    Culture (A)  Final    CLOSTRIDIUM RAMOSUM Standardized susceptibility testing for this organism is not available. Performed at [facility], [address], [place], [place] [address]    Report Status 07/11/2016 FINAL  Final  Blood Culture ID Panel (Reflexed)     Status: None   Collection Time: 07/07/16 10:07 PM  Result Value Ref Range Status   Enterococcus species NOT DETECTED NOT DETECTED Final   Listeria monocytogenes NOT DETECTED NOT DETECTED Final   Staphylococcus species NOT DETECTED NOT DETECTED Final   Staphylococcus aureus NOT DETECTED NOT DETECTED Final   Streptococcus species NOT DETECTED NOT DETECTED Final   Streptococcus agalactiae NOT DETECTED NOT DETECTED Final   Streptococcus pneumoniae NOT DETECTED NOT DETECTED Final   Streptococcus pyogenes NOT DETECTED NOT DETECTED Final   Acinetobacter baumannii NOT DETECTED NOT DETECTED Final   Enterobacteriaceae species NOT DETECTED NOT DETECTED Final   Enterobacter cloacae complex NOT DETECTED NOT DETECTED Final   Escherichia coli NOT DETECTED NOT DETECTED Final   Klebsiella oxytoca NOT DETECTED NOT DETECTED Final   Klebsiella pneumoniae NOT DETECTED NOT DETECTED Final   Proteus species NOT DETECTED NOT DETECTED Final   Serratia marcescens NOT DETECTED NOT DETECTED Final   Haemophilus influenzae NOT DETECTED NOT DETECTED Final   Neisseria meningitidis NOT DETECTED NOT DETECTED Final   Pseudomonas aeruginosa NOT DETECTED NOT DETECTED Final   Candida albicans NOT DETECTED NOT DETECTED Final   Candida glabrata NOT DETECTED NOT DETECTED Final   Candida krusei NOT DETECTED NOT DETECTED Final   Candida parapsilosis NOT DETECTED NOT DETECTED Final   Candida tropicalis NOT DETECTED NOT DETECTED Final  MRSA PCR Screening     Status: None   Collection Time: 07/11/16 11:14 PM  Result Value Ref Range Status   MRSA by PCR NEGATIVE NEGATIVE Final    Comment:        The GeneXpert MRSA Assay (FDA approved for NASAL specimens only), is one component of a comprehensive MRSA colonization surveillance program. It is not intended to diagnose MRSA infection nor to guide  or monitor treatment for MRSA infections.   Aerobic/Anaerobic Culture (surgical/deep wound)     Status: None (Preliminary result)   Collection Time: 07/12/16 10:38 PM  Result Value Ref Range Status   Specimen Description [facility]  Final   Special Requests NONE  Final   Gram Stain   Final    RARE WBC PRESENT, PREDOMINANTLY PMN RARE GRAM POSITIVE COCCI IN PAIRS    Culture   Final    FEW ENTEROCOCCUS FAECIUM SUSCEPTIBILITIES PERFORMED ON  PREVIOUS CULTURE WITHIN THE LAST 5 DAYS. HOLDING FOR POSSIBLE ANAEROBE Performed at [facility], [address], [place], [place] [address]    Report Status PENDING  Incomplete  Aerobic/Anaerobic Culture (surgical/deep wound)     Status: None (Preliminary result)   Collection Time: 07/12/16 10:39 PM  Result Value Ref Range Status   Specimen Description [facility]  Final   Special Requests NONE  Final   Gram Stain   Final    RARE WBC PRESENT, PREDOMINANTLY PMN RARE GRAM POSITIVE COCCI IN PAIRS RARE GRAM POSITIVE RODS    Culture   Final    FEW VANCOMYCIN RESISTANT ENTEROCOCCUS HOLDING FOR POSSIBLE ANAEROBE Performed at [facility], [address] [place], [place] [address]    Report Status PENDING  Incomplete   Organism ID, Bacteria VANCOMYCIN RESISTANT ENTEROCOCCUS  Final      Susceptibility   Vancomycin resistant enterococcus - MIC*    AMPICILLIN >=32 RESISTANT Resistant     VANCOMYCIN >=32 RESISTANT Resistant     GENTAMICIN SYNERGY SENSITIVE Sensitive     LINEZOLID 2 SENSITIVE Sensitive     * FEW VANCOMYCIN RESISTANT ENTEROCOCCUS    Coagulation Studies: No results for input(s): LABPROT, INR in the last 72 hours.  Urinalysis: No results for input(s): COLORURINE, LABSPEC, PHURINE, GLUCOSEU, HGBUR, BILIRUBINUR, KETONESUR, PROTEINUR, UROBILINOGEN, NITRITE, LEUKOCYTESUR in the last 72 hours.  Invalid input(s): APPERANCEUR    Imaging: US Renal  Result Date: 07/15/2016 CLINICAL DATA:  Acute renal failure. EXAM: RENAL /  URINARY TRACT ULTRASOUND COMPLETE COMPARISON:  Ultrasound on 03/29/2016 and CT on 07/11/2016 FINDINGS: Right Kidney: Length: 9.7 cm. Echogenicity within normal limits. No mass or hydronephrosis visualized. Left Kidney: Length: 10.8 cm. Echogenicity within normal limits. No mass or hydronephrosis visualized. Bladder: Mild diffuse bladder wall thickening, without evidence of focal mass. IMPRESSION: Normal appearance of both kidneys.  No evidence of hydronephrosis. Mild diffuse bladder wall thickening, which may be due to chronic bladder outlet obstruction or cystitis. Electronically Signed   By: Myles Rosenthal M.D.   On: 07/15/2016 15:44     Medications:   . 0.9 % NaCl with KCl 20 mEq / L 50 mL/hr at 07/15/16 1156   . sodium chloride   Intravenous Once  . ampicillin-sulbactam (UNASYN) IV  3 g Intravenous Q12H  . atorvastatin  80 mg Oral q1800  . carvedilol  6.25 mg Oral BID WC  . feeding supplement (ENSURE ENLIVE)  237 mL Oral BID BM  . heparin subcutaneous  5,000 Units Subcutaneous Q8H  . insulin aspart  0-15 Units Subcutaneous Q4H  . insulin glargine  10 Units Subcutaneous QHS  . iopamidol  30 mL Oral Once  . linezolid (ZYVOX) IV  600 mg Intravenous Q12H  . pantoprazole  40 mg Oral Daily  . sodium hypochlorite   Irrigation BID  . tamsulosin  0.4 mg Oral Daily   acetaminophen **OR** acetaminophen, albuterol, HYDROcodone-acetaminophen, morphine injection, ondansetron **OR** ondansetron (ZOFRAN) IV  Assessment/ Plan:  66 y.o.black male with cardiomyopathy with ejection pressure 30-35%, coronary artery disease status post stent placement, hypertension, peripheral arterial disease status post right BKA, lower extremity edema, diabetes mellitus type 2, admitted with severe sacral decubitis ulcer now with ARF.   1. 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 Baseline Cr 1.2 04/2016. Follows with Dr. Cherylann Ratel as outpatient.   - Ultrasound reviewed.   - Continue IV fluids: NS with 20KCl at 79mL/hr   2. Hyponatremia: improving  with IV saline   3. Anemia of chronic kidney disease: Hemoglobin 7.1 No indication for transfusion  4. Sacral decubitus ulcer and abscess: status post incision and drainage on 4/5 Dr. Everlene Farrier - Unasyn and linezolid   LOS: 9 Damon [name] 4/9/20189:46 AM

## 2016-07-16 NOTE — Progress Notes (Signed)
[facility] [place] [facility]   PATIENT NAME: Damon [name]    MR#:  [id number]  DATE OF BIRTH:  1951/01/02  SUBJECTIVE:   Patient with good UOP overnight  REVIEW OF SYSTEMS:    Review of Systems  Constitutional: Negative for chills, fever and malaise/fatigue.  HENT: Negative.  Negative for ear discharge, ear pain, hearing loss, nosebleeds and sore throat.   Eyes: Negative.  Negative for blurred vision and pain.  Respiratory: Negative.  Negative for cough, hemoptysis, shortness of breath and wheezing.   Cardiovascular: Negative.  Negative for chest pain, palpitations and leg swelling.  Gastrointestinal: Negative.  Negative for abdominal pain, blood in stool, diarrhea, nausea and vomiting.  Genitourinary: Negative.  Negative for dysuria.  Musculoskeletal: Negative.  Negative for back pain.  Skin: Negative.   Neurological: Positive for weakness. Negative for dizziness, tremors, speech change, focal weakness, seizures and headaches.  Endo/Heme/Allergies: Negative.  Does not bruise/bleed easily.  Psychiatric/Behavioral: Negative.  Negative for depression, hallucinations and suicidal ideas.    Tolerating Diet: yes      DRUG ALLERGIES:  No Known Allergies  VITALS:  Blood pressure 127/61, pulse 75, temperature 98.4 F (36.9 C), temperature source Oral, resp. rate 18, height  (1.676 m), weight 73.9 kg (162 lb 14.4 oz), SpO2 100 %.  PHYSICAL EXAMINATION:   Physical Exam  Constitutional: He is oriented to person, place, and time and well-developed, well-nourished, and in no distress. No distress.  HENT:  Head: Normocephalic.  Eyes: No scleral icterus.  Neck: Normal range of motion. Neck supple. No JVD present. No tracheal deviation present.  Cardiovascular: Normal rate, regular rhythm and normal heart sounds.  Exam reveals no gallop and no friction rub.   No murmur heard. Pulmonary/Chest: Effort normal and breath sounds normal. No respiratory distress.  He has no wheezes. He has no rales. He exhibits no tenderness.  Abdominal: Soft. Bowel sounds are normal. He exhibits no distension and no mass. There is no tenderness. There is no rebound and no guarding.  Musculoskeletal: Normal range of motion. He exhibits no edema.  Crusting and ulcers on the left foot. Right below-knee amputation Boot placed left foot  Neurological: He is alert and oriented to person, place, and time.  Skin: Skin is warm. No rash noted. No erythema.  Psychiatric: Affect and judgment normal.      LABORATORY PANEL:   CBC  Recent Labs Lab 07/16/16 0511  WBC 11.2*  HGB 7.1*  HCT 21.7*  PLT 332   ------------------------------------------------------------------------------------------------------------------  Chemistries   Recent Labs Lab 07/10/16 0409  07/16/16 0511  NA  --   < > 134*  K  --   < > 4.6  CL  --   < > 108  CO2  --   < > 19*  GLUCOSE  --   < > 106*  BUN  --   < > 84*  CREATININE 1.63*  < > 3.36*  CALCIUM  --   < > 7.7*  MG 1.8  --   --   AST  --   --  35  ALT  --   --  46  ALKPHOS  --   --  70  BILITOT  --   --  0.7  < > = values in this interval not displayed. ------------------------------------------------------------------------------------------------------------------  Cardiac Enzymes No results for input(s): TROPONINI in the last 168 hours. ------------------------------------------------------------------------------------------------------------------  RADIOLOGY:  US Renal  Result Date: 07/15/2016 CLINICAL DATA:  Acute renal failure.  EXAM: RENAL / URINARY TRACT ULTRASOUND COMPLETE COMPARISON:  Ultrasound on 03/29/2016 and CT on 07/11/2016 FINDINGS: Right Kidney: Length: 9.7 cm. Echogenicity within normal limits. No mass or hydronephrosis visualized. Left Kidney: Length: 10.8 cm. Echogenicity within normal limits. No mass or hydronephrosis visualized. Bladder: Mild diffuse bladder wall thickening, without evidence of focal  mass. IMPRESSION: Normal appearance of both kidneys.  No evidence of hydronephrosis. Mild diffuse bladder wall thickening, which may be due to chronic bladder outlet obstruction or cystitis. Electronically Signed   By: Myles Rosenthal M.D.   On: 07/15/2016 15:44     ASSESSMENT AND PLAN:   66 year old male with history of CAD and chronic kidney disease stage II presented with severe sacral decubitus ulcers.  1. Acute on chronic kidney disease stage II: Patient's creatinine still elevated this morning. CT of the abdomen showed no hydronephrosis Continue IV fluids Appreciate nephrology consultation  2. Sacral decubitus ulcer and complex abscess status post incision and drainage on April 5 Continue Unasyn and linezolid  3. Sepsis present on admission with Clostridium bacteremia due to sacral abscesses   4. Acute on chronic anemia: Transfuse one unit today Total of 2 units  5. Chronic systolic heart failure with ejection fraction 25-30%: No acute signs of exacerbation  6. CAD status post PCI in October: Continue atorvastatin and Coreg  7. Diabetes: Increase Lantus and add NovoLog 3 units 3 times a day as per recommendations by diabetes coordinator Continue ADA diet  8. BPH: Continue tamsulosin Management plans discussed with the patient and he is in agreement.  CODE STATUS: full  TOTAL TIME TAKING CARE OF THIS PATIENT: 28 minutes.   D/w dr Ronn Melena  POSSIBLE D/C  2-3 days to SNF, DEPENDING ON CLINICAL CONDITION.   Alysah Carton M.D on 07/16/2016 at 11:57 AM  Between 7am to 6pm - Pager - [phone] After 6pm go to [email or web address] - password [email or web address]  [facility]  Office  [phone]  CC: Primary care physician; Emogene Morgan, MD  Note: This dictation was prepared with Dragon dictation along with smaller phrase technology. Any transcriptional errors that result from this process are unintentional.

## 2016-07-16 NOTE — Progress Notes (Signed)
[facility] CLINIC INFECTIOUS DISEASE PROGRESS NOTE Date of Admission:  07/07/2016     ID: Chaney Born is a 66 y.o. male with gluteal abscess, clostridial sepsis Principal Problem:   Sepsis (HCC) Active Problems:   Coronary artery disease   Essential hypertension   HLD (hyperlipidemia)   Diabetes mellitus type 2 with complications (HCC)   Chronic systolic heart failure (HCC)   PAD (peripheral artery disease) (HCC)   Ulcer of left foot (HCC)   Abscess, gluteal, left   Necrotizing soft tissue infection (HCC)   Subjective: No fevers, wbc down to 11 Started linezolid 4/8  ROS  Eleven systems are reviewed and negative except per hpi  Medications:  Antibiotics Given (last 72 hours)    Date/Time Action Medication Dose Rate   07/13/16 1338 Given   piperacillin-tazobactam (ZOSYN) IVPB 3.375 g 3.375 g 12.5 mL/hr   07/13/16 1338 Given   clindamycin (CLEOCIN) IVPB 600 mg 600 mg 100 mL/hr   07/13/16 1700 Given   vancomycin (VANCOCIN) IVPB 1000 mg/200 mL premix 1,000 mg 200 mL/hr   07/13/16 2159 Given   piperacillin-tazobactam (ZOSYN) IVPB 3.375 g 3.375 g 12.5 mL/hr   07/13/16 2159 Given   clindamycin (CLEOCIN) IVPB 600 mg 600 mg 100 mL/hr   07/14/16 0534 Given   piperacillin-tazobactam (ZOSYN) IVPB 3.375 g 3.375 g 12.5 mL/hr   07/14/16 0534 Given   clindamycin (CLEOCIN) IVPB 600 mg 600 mg 100 mL/hr   07/14/16 1423 Given   clindamycin (CLEOCIN) IVPB 600 mg 600 mg 100 mL/hr   07/14/16 2102 Given   Ampicillin-Sulbactam (UNASYN) 3 g in sodium chloride 0.9 % 100 mL IVPB 3 g 200 mL/hr   07/14/16 2200 Given   clindamycin (CLEOCIN) IVPB 600 mg 600 mg 100 mL/hr   07/15/16 0500 Given   clindamycin (CLEOCIN) IVPB 600 mg 600 mg 100 mL/hr   07/15/16 0826 Given   Ampicillin-Sulbactam (UNASYN) 3 g in sodium chloride 0.9 % 100 mL IVPB 3 g 200 mL/hr   07/15/16 1526 Given   linezolid (ZYVOX) IVPB 600 mg 600 mg 300 mL/hr   07/15/16 2219 Given   Ampicillin-Sulbactam (UNASYN) 3 g in sodium chloride  0.9 % 100 mL IVPB 3 g 200 mL/hr   07/15/16 2220 Given   linezolid (ZYVOX) IVPB 600 mg 600 mg 300 mL/hr     . sodium chloride   Intravenous Once  . ampicillin-sulbactam (UNASYN) IV  3 g Intravenous Q12H  . atorvastatin  80 mg Oral q1800  . carvedilol  6.25 mg Oral BID WC  . feeding supplement (ENSURE ENLIVE)  237 mL Oral BID BM  . heparin subcutaneous  5,000 Units Subcutaneous Q8H  . insulin aspart  0-15 Units Subcutaneous Q4H  . insulin glargine  10 Units Subcutaneous QHS  . iopamidol  30 mL Oral Once  . linezolid (ZYVOX) IV  600 mg Intravenous Q12H  . pantoprazole  40 mg Oral Daily  . sodium hypochlorite   Irrigation BID  . tamsulosin  0.4 mg Oral Daily    Objective: Vital signs in last 24 hours: Temp:  [98.2 F (36.8 C)-98.9 F (37.2 C)] 98.4 F (36.9 C) (04/09 0356) Pulse Rate:  [67-80] 75 (04/09 0828) Resp:  [18-22] 18 (04/09 0356) BP: (127-135)/(46-61) 127/61 (04/09 0828) SpO2:  [99 %-100 %] 100 % (04/09 0828) Weight:  [73.9 kg (162 lb 14.4 oz)] 73.9 kg (162 lb 14.4 oz) (04/09 0356) Constitutional: He is awake and interactive but appears chronically ill and older than stated age HENT: anicteric,  muddy sclera Mouth/Throat: Oropharynx is clear and dry, poor dentition . No oropharyngeal exudate.  Cardiovascular: Normal rate, regular rhythm and normal heart sounds.  Pulmonary/Chest: Effort normal and breath sounds normal. No respiratory distress. He has no wheezes.  Abdominal: Soft. Bowel sounds are normal. He exhibits no distension. There is no tenderness.  Lymphadenopathy: He has no cervical adenopathy.  Neurological: He is awake and interactive Skin: large sacral and buttock wound, with packing, mild ttp  Psychiatric: He has a normal mood and affect. His behavior is normal.   Lab Results  Recent Labs  07/15/16 0714 07/16/16 0511  WBC 13.1* 11.2*  HGB 7.0* 7.1*  HCT 21.5* 21.7*  NA 133* 134*  K 4.3 4.6  CL 107 108  CO2 18* 19*  BUN 87* 84*  CREATININE 3.76*  3.36*    Microbiology: Results for orders placed or performed during the hospital encounter of 07/07/16  Blood Culture (routine x 2)     Status: Abnormal   Collection Time: 07/07/16 10:07 PM  Result Value Ref Range Status   Specimen Description BLOOD LEFT FOREARM  Final   Special Requests   Final    BOTTLES DRAWN AEROBIC AND ANAEROBIC Blood Culture adequate volume   Culture  Setup Time   Final    GRAM POSITIVE RODS ANAEROBIC BOTTLE ONLY CRITICAL VALUE NOTED.  VALUE IS CONSISTENT WITH PREVIOUSLY REPORTED AND CALLED VALUE.    Culture (A)  Final    CLOSTRIDIUM RAMOSUM Standardized susceptibility testing for this organism is not available.    Report Status 07/11/2016 FINAL  Final  Blood Culture (routine x 2)     Status: Abnormal   Collection Time: 07/07/16 10:07 PM  Result Value Ref Range Status   Specimen Description BLOOD RIGHT HAND  Final   Special Requests   Final    BOTTLES DRAWN AEROBIC AND ANAEROBIC Blood Culture adequate volume   Culture  Setup Time   Final    Organism ID to follow GRAM POSITIVE RODS ANAEROBIC BOTTLE ONLY CRITICAL RESULT CALLED TO, READ BACK BY AND VERIFIED WITH: NATE COOKSON AT 2046 07/08/16.SDR/PMH    Culture (A)  Final    CLOSTRIDIUM RAMOSUM Standardized susceptibility testing for this organism is not available. Performed at [facility], [address] [place], [place] [address]    Report Status 07/11/2016 FINAL  Final  Blood Culture ID Panel (Reflexed)     Status: None   Collection Time: 07/07/16 10:07 PM  Result Value Ref Range Status   Enterococcus species NOT DETECTED NOT DETECTED Final   Listeria monocytogenes NOT DETECTED NOT DETECTED Final   Staphylococcus species NOT DETECTED NOT DETECTED Final   Staphylococcus aureus NOT DETECTED NOT DETECTED Final   Streptococcus species NOT DETECTED NOT DETECTED Final   Streptococcus agalactiae NOT DETECTED NOT DETECTED Final   Streptococcus pneumoniae NOT DETECTED NOT DETECTED Final    Streptococcus pyogenes NOT DETECTED NOT DETECTED Final   Acinetobacter baumannii NOT DETECTED NOT DETECTED Final   Enterobacteriaceae species NOT DETECTED NOT DETECTED Final   Enterobacter cloacae complex NOT DETECTED NOT DETECTED Final   Escherichia coli NOT DETECTED NOT DETECTED Final   Klebsiella oxytoca NOT DETECTED NOT DETECTED Final   Klebsiella pneumoniae NOT DETECTED NOT DETECTED Final   Proteus species NOT DETECTED NOT DETECTED Final   Serratia marcescens NOT DETECTED NOT DETECTED Final   Haemophilus influenzae NOT DETECTED NOT DETECTED Final   Neisseria meningitidis NOT DETECTED NOT DETECTED Final   Pseudomonas aeruginosa NOT DETECTED NOT DETECTED  Final   Candida albicans NOT DETECTED NOT DETECTED Final   Candida glabrata NOT DETECTED NOT DETECTED Final   Candida krusei NOT DETECTED NOT DETECTED Final   Candida parapsilosis NOT DETECTED NOT DETECTED Final   Candida tropicalis NOT DETECTED NOT DETECTED Final  MRSA PCR Screening     Status: None   Collection Time: 07/11/16 11:14 PM  Result Value Ref Range Status   MRSA by PCR NEGATIVE NEGATIVE Final    Comment:        The GeneXpert MRSA Assay (FDA approved for NASAL specimens only), is one component of a comprehensive MRSA colonization surveillance program. It is not intended to diagnose MRSA infection nor to guide or monitor treatment for MRSA infections.   Aerobic/Anaerobic Culture (surgical/deep wound)     Status: None (Preliminary result)   Collection Time: 07/12/16 10:38 PM  Result Value Ref Range Status   Specimen Description [facility]  Final   Special Requests NONE  Final   Gram Stain   Final    RARE WBC PRESENT, PREDOMINANTLY PMN RARE GRAM POSITIVE COCCI IN PAIRS    Culture   Final    FEW ENTEROCOCCUS FAECIUM SUSCEPTIBILITIES PERFORMED ON PREVIOUS CULTURE WITHIN THE LAST 5 DAYS. HOLDING FOR POSSIBLE ANAEROBE Performed at [facility], [address], [place], [place] [address]    Report Status  PENDING  Incomplete  Aerobic/Anaerobic Culture (surgical/deep wound)     Status: None (Preliminary result)   Collection Time: 07/12/16 10:39 PM  Result Value Ref Range Status   Specimen Description [facility]  Final   Special Requests NONE  Final   Gram Stain   Final    RARE WBC PRESENT, PREDOMINANTLY PMN RARE GRAM POSITIVE COCCI IN PAIRS RARE GRAM POSITIVE RODS    Culture   Final    FEW VANCOMYCIN RESISTANT ENTEROCOCCUS HOLDING FOR POSSIBLE ANAEROBE Performed at [facility], [address], [place], [place] [address]    Report Status PENDING  Incomplete   Organism ID, Bacteria VANCOMYCIN RESISTANT ENTEROCOCCUS  Final      Susceptibility   Vancomycin resistant enterococcus - MIC*    AMPICILLIN >=32 RESISTANT Resistant     VANCOMYCIN >=32 RESISTANT Resistant     GENTAMICIN SYNERGY SENSITIVE Sensitive     LINEZOLID 2 SENSITIVE Sensitive     * FEW VANCOMYCIN RESISTANT ENTEROCOCCUS    Studies/Results: US Renal  Result Date: 07/15/2016 CLINICAL DATA:  Acute renal failure. EXAM: RENAL / URINARY TRACT ULTRASOUND COMPLETE COMPARISON:  Ultrasound on 03/29/2016 and CT on 07/11/2016 FINDINGS: Right Kidney: Length: 9.7 cm. Echogenicity within normal limits. No mass or hydronephrosis visualized. Left Kidney: Length: 10.8 cm. Echogenicity within normal limits. No mass or hydronephrosis visualized. Bladder: Mild diffuse bladder wall thickening, without evidence of focal mass. IMPRESSION: Normal appearance of both kidneys.  No evidence of hydronephrosis. Mild diffuse bladder wall thickening, which may be due to chronic bladder outlet obstruction or cystitis. Electronically Signed   By: Myles Rosenthal M.D.   On: 07/15/2016 15:44    Assessment/Plan: ELDEAN NANNA a 66 y.o.malewith clostridial sepsis from sacral ulcer and gluteal abscess. Now s/p I and D 07/12/16 with cx also growing VRE (R to amp as well). Started on linezolid.  Course complicated by ARF. He has an impressive wound and will need  several weeks of IV abx and oral linezolid.   Recommendations Cont Unasyn BID - plan on 4-6 week course Place PICC - if Ok with renal - may need Neck line Continue  linezolid for 2 weeks total - it is not recommended to use > 2 weeks- needs weekly CBC while on this If needs VRE treatment beyond 2 weeks will need to start IV daptomycin  Cont wound care per surgery.   Thank you very much for the consult. Will follow with you.  FITZGERALD, DAVID P   07/16/2016, 10:27 AM

## 2016-07-17 ENCOUNTER — Inpatient Hospital Stay: Payer: Commercial Managed Care - PPO

## 2016-07-17 LAB — BASIC METABOLIC PANEL
Anion gap: 7 (ref 5–15)
BUN: 70 mg/dL — AB (ref 6–20)
CO2: 18 mmol/L — AB (ref 22–32)
Calcium: 8.1 mg/dL — ABNORMAL LOW (ref 8.9–10.3)
Chloride: 111 mmol/L (ref 101–111)
Creatinine, Ser: 2.65 mg/dL — ABNORMAL HIGH (ref 0.61–1.24)
GFR calc Af Amer: 28 mL/min — ABNORMAL LOW (ref >60)
GFR calc non Af Amer: 24 mL/min — ABNORMAL LOW (ref >60)
GLUCOSE: 81 mg/dL (ref 65–99)
POTASSIUM: 4.9 mmol/L (ref 3.5–5.1)
Sodium: 136 mmol/L (ref 135–145)

## 2016-07-17 LAB — BPAM RBC
BLOOD PRODUCT EXPIRATION DATE: 201804132359
ISSUE DATE / TIME: 201804091343
UNIT TYPE AND RH: 9500

## 2016-07-17 LAB — AEROBIC/ANAEROBIC CULTURE W GRAM STAIN (SURGICAL/DEEP WOUND)

## 2016-07-17 LAB — AEROBIC/ANAEROBIC CULTURE (SURGICAL/DEEP WOUND)

## 2016-07-17 LAB — TYPE AND SCREEN
ABO/RH(D): O POS
Antibody Screen: NEGATIVE
Unit division: 0

## 2016-07-17 LAB — GLUCOSE, CAPILLARY
GLUCOSE-CAPILLARY: 79 mg/dL (ref 65–99)
GLUCOSE-CAPILLARY: 263 mg/dL — AB (ref 65–99)
GLUCOSE-CAPILLARY: 363 mg/dL — AB (ref 65–99)
Glucose-Capillary: 157 mg/dL — ABNORMAL HIGH (ref 65–99)
Glucose-Capillary: 175 mg/dL — ABNORMAL HIGH (ref 65–99)
Glucose-Capillary: 85 mg/dL (ref 65–99)

## 2016-07-17 LAB — CBC
HEMATOCRIT: 28.8 % — AB (ref 40.0–52.0)
Hemoglobin: 9.5 g/dL — ABNORMAL LOW (ref 13.0–18.0)
MCH: 29.3 pg (ref 26.0–34.0)
MCHC: 32.9 g/dL (ref 32.0–36.0)
MCV: 89.2 fL (ref 80.0–100.0)
Platelets: 363 10*3/uL (ref 150–440)
RBC: 3.23 MIL/uL — ABNORMAL LOW (ref 4.40–5.90)
RDW: 16.7 % — AB (ref 11.5–14.5)
WBC: 11.6 10*3/uL — AB (ref 3.8–10.6)

## 2016-07-17 MED ORDER — ENSURE ENLIVE PO LIQD
237.0000 mL | Freq: Three times a day (TID) | ORAL | Status: DC
  Administered 2016-07-17 – 2016-07-20 (×7): 237 mL via ORAL

## 2016-07-17 MED ORDER — SODIUM BICARBONATE 8.4 % IV SOLN
INTRAVENOUS | Status: DC
  Administered 2016-07-17 – 2016-07-18 (×2): via INTRAVENOUS
  Filled 2016-07-17 (×2): qty 150

## 2016-07-17 MED ORDER — SODIUM CHLORIDE 0.9% FLUSH: 10.0000 mL | INTRAVENOUS | Status: DC | PRN

## 2016-07-17 NOTE — Progress Notes (Signed)
[facility] INFECTIOUS DISEASE PROGRESS NOTE Date of Admission:  07/07/2016     ID: Damon [name] is a 66 y.o. male with gluteal abscess, clostridial sepsis Principal Problem:   Sepsis (HCC) Active Problems:   Coronary artery disease   Essential hypertension   HLD (hyperlipidemia)   Diabetes mellitus type 2 with complications (HCC)   Chronic systolic heart failure (HCC)   PAD (peripheral artery disease) (HCC)   Ulcer of left foot (HCC)   Abscess, gluteal, left   Necrotizing soft tissue infection (HCC)   Subjective: No fevers, got transfusion. Cr down some urinating well.   ROS  Eleven systems are reviewed and negative except per hpi  Medications:  Antibiotics Given (last 72 hours)    Date/Time Action Medication Dose Rate   07/14/16 2102 Given   Ampicillin-Sulbactam (UNASYN) 3 g in sodium chloride 0.9 % 100 mL IVPB 3 g 200 mL/hr   07/14/16 2200 Given   clindamycin (CLEOCIN) IVPB 600 mg 600 mg 100 mL/hr   07/15/16 0500 Given   clindamycin (CLEOCIN) IVPB 600 mg 600 mg 100 mL/hr   07/15/16 0826 Given   Ampicillin-Sulbactam (UNASYN) 3 g in sodium chloride 0.9 % 100 mL IVPB 3 g 200 mL/hr   07/15/16 1526 Given   linezolid (ZYVOX) IVPB 600 mg 600 mg 300 mL/hr   07/15/16 2219 Given   Ampicillin-Sulbactam (UNASYN) 3 g in sodium chloride 0.9 % 100 mL IVPB 3 g 200 mL/hr   07/15/16 2220 Given   linezolid (ZYVOX) IVPB 600 mg 600 mg 300 mL/hr   07/16/16 1109 Given   Ampicillin-Sulbactam (UNASYN) 3 g in sodium chloride 0.9 % 100 mL IVPB 3 g 200 mL/hr   07/16/16 1118 Given   linezolid (ZYVOX) IVPB 600 mg 600 mg 300 mL/hr   07/16/16 2106 Given   Ampicillin-Sulbactam (UNASYN) 3 g in sodium chloride 0.9 % 100 mL IVPB 3 g 200 mL/hr   07/17/16 0835 Given   Ampicillin-Sulbactam (UNASYN) 3 g in sodium chloride 0.9 % 100 mL IVPB 3 g 200 mL/hr   07/17/16 0836 Given   linezolid (ZYVOX) IVPB 600 mg 600 mg 300 mL/hr     . ampicillin-sulbactam (UNASYN) IV  3 g Intravenous Q12H  .  atorvastatin  80 mg Oral q1800  . carvedilol  6.25 mg Oral BID WC  . feeding supplement (ENSURE ENLIVE)  237 mL Oral TID BM  . heparin subcutaneous  5,000 Units Subcutaneous Q8H  . insulin aspart  0-15 Units Subcutaneous Q4H  . insulin aspart  0-5 Units Subcutaneous QHS  . insulin glargine  14 Units Subcutaneous QHS  . iopamidol  30 mL Oral Once  . linezolid (ZYVOX) IV  600 mg Intravenous Q12H  . pantoprazole  40 mg Oral Daily  . tamsulosin  0.4 mg Oral Daily    Objective: Vital signs in last 24 hours: Temp:  [98 F (36.7 C)-98.2 F (36.8 C)] 98.2 F (36.8 C) (04/10 1231) Pulse Rate:  [69-81] 81 (04/10 1231) Resp:  [18-20] 20 (04/10 1231) BP: (122-149)/(53-58) 145/57 (04/10 1231) SpO2:  [98 %-100 %] 99 % (04/10 1231) Weight:  [73.8 kg (162 lb 11.2 oz)-74.5 kg (164 lb 4.8 oz)] 74.5 kg (164 lb 4.8 oz) (04/10 7829) Constitutional: He is awake and interactive but appears chronically ill and older than stated age HENT: anicteric, muddy sclera Mouth/Throat: Oropharynx is clear and dry, poor dentition . No oropharyngeal exudate.  Cardiovascular: Normal rate, regular rhythm and normal heart sounds.  Pulmonary/Chest: Effort normal and breath  sounds normal. No respiratory distress. He has no wheezes.  Abdominal: Soft. Bowel sounds are normal. He exhibits no distension. There is no tenderness.  Lymphadenopathy: He has no cervical adenopathy.  Neurological: He is awake and interactive Skin: large sacral and buttock wound, with packing, mild ttp  Psychiatric: He has a normal mood and affect. His behavior is normal.   Lab Results  Recent Labs  07/16/16 0511 07/16/16 1729 07/17/16 0820  WBC 11.2*  --  11.6*  HGB 7.1* 9.4* 9.5*  HCT 21.7* 27.9* 28.8*  NA 134*  --  136  K 4.6  --  4.9  CL 108  --  111  CO2 19*  --  18*  BUN 84*  --  70*  CREATININE 3.36*  --  2.65*    Microbiology: Results for orders placed or performed during the hospital encounter of 07/07/16  Blood Culture  (routine x 2)     Status: Abnormal   Collection Time: 07/07/16 10:07 PM  Result Value Ref Range Status   Specimen Description BLOOD LEFT FOREARM  Final   Special Requests   Final    BOTTLES DRAWN AEROBIC AND ANAEROBIC Blood Culture adequate volume   Culture  Setup Time   Final    GRAM POSITIVE RODS ANAEROBIC BOTTLE ONLY CRITICAL VALUE NOTED.  VALUE IS CONSISTENT WITH PREVIOUSLY REPORTED AND CALLED VALUE.    Culture (A)  Final    CLOSTRIDIUM RAMOSUM Standardized susceptibility testing for this organism is not available.    Report Status 07/11/2016 FINAL  Final  Blood Culture (routine x 2)     Status: Abnormal   Collection Time: 07/07/16 10:07 PM  Result Value Ref Range Status   Specimen Description BLOOD RIGHT HAND  Final   Special Requests   Final    BOTTLES DRAWN AEROBIC AND ANAEROBIC Blood Culture adequate volume   Culture  Setup Time   Final    Organism ID to follow GRAM POSITIVE RODS ANAEROBIC BOTTLE ONLY CRITICAL RESULT CALLED TO, READ BACK BY AND VERIFIED WITH: NATE COOKSON AT 2046 07/08/16.SDR/PMH    Culture (A)  Final    CLOSTRIDIUM RAMOSUM Standardized susceptibility testing for this organism is not available. Performed at [facility], [address], [place], [place] [address]    Report Status 07/11/2016 FINAL  Final  Blood Culture ID Panel (Reflexed)     Status: None   Collection Time: 07/07/16 10:07 PM  Result Value Ref Range Status   Enterococcus species NOT DETECTED NOT DETECTED Final   Listeria monocytogenes NOT DETECTED NOT DETECTED Final   Staphylococcus species NOT DETECTED NOT DETECTED Final   Staphylococcus aureus NOT DETECTED NOT DETECTED Final   Streptococcus species NOT DETECTED NOT DETECTED Final   Streptococcus agalactiae NOT DETECTED NOT DETECTED Final   Streptococcus pneumoniae NOT DETECTED NOT DETECTED Final   Streptococcus pyogenes NOT DETECTED NOT DETECTED Final   Acinetobacter baumannii NOT DETECTED NOT DETECTED Final    Enterobacteriaceae species NOT DETECTED NOT DETECTED Final   Enterobacter cloacae complex NOT DETECTED NOT DETECTED Final   Escherichia coli NOT DETECTED NOT DETECTED Final   Klebsiella oxytoca NOT DETECTED NOT DETECTED Final   Klebsiella pneumoniae NOT DETECTED NOT DETECTED Final   Proteus species NOT DETECTED NOT DETECTED Final   Serratia marcescens NOT DETECTED NOT DETECTED Final   Haemophilus influenzae NOT DETECTED NOT DETECTED Final   Neisseria meningitidis NOT DETECTED NOT DETECTED Final   Pseudomonas aeruginosa NOT DETECTED NOT DETECTED Final   Candida albicans  NOT DETECTED NOT DETECTED Final   Candida glabrata NOT DETECTED NOT DETECTED Final   Candida krusei NOT DETECTED NOT DETECTED Final   Candida parapsilosis NOT DETECTED NOT DETECTED Final   Candida tropicalis NOT DETECTED NOT DETECTED Final  MRSA PCR Screening     Status: None   Collection Time: 07/11/16 11:14 PM  Result Value Ref Range Status   MRSA by PCR NEGATIVE NEGATIVE Final    Comment:        The GeneXpert MRSA Assay (FDA approved for NASAL specimens only), is one component of a comprehensive MRSA colonization surveillance program. It is not intended to diagnose MRSA infection nor to guide or monitor treatment for MRSA infections.   Aerobic/Anaerobic Culture (surgical/deep wound)     Status: None   Collection Time: 07/12/16 10:38 PM  Result Value Ref Range Status   Specimen Description [facility]  Final   Special Requests NONE  Final   Gram Stain   Final    RARE WBC PRESENT, PREDOMINANTLY PMN RARE GRAM POSITIVE COCCI IN PAIRS Performed at [facility], [address], [place], [place] [address]    Culture   Final    FEW ENTEROCOCCUS FAECIUM SUSCEPTIBILITIES PERFORMED ON PREVIOUS CULTURE WITHIN THE LAST 5 DAYS. RARE CLOSTRIDIUM RAMOSUM RARE PEPTOSTREPTOCOCCUS SPECIES    Report Status 07/17/2016 FINAL  Final  Aerobic/Anaerobic Culture (surgical/deep wound)     Status: None   Collection Time:  07/12/16 10:39 PM  Result Value Ref Range Status   Specimen Description [facility]  Final   Special Requests NONE  Final   Gram Stain   Final    RARE WBC PRESENT, PREDOMINANTLY PMN RARE GRAM POSITIVE COCCI IN PAIRS RARE GRAM POSITIVE RODS Performed at [facility], [address], [place], [place] [address]    Culture   Final    FEW VANCOMYCIN RESISTANT ENTEROCOCCUS FEW CLOSTRIDIUM RAMOSUM    Report Status 07/17/2016 FINAL  Final   Organism ID, Bacteria VANCOMYCIN RESISTANT ENTEROCOCCUS  Final      Susceptibility   Vancomycin resistant enterococcus - MIC*    AMPICILLIN >=32 RESISTANT Resistant     VANCOMYCIN >=32 RESISTANT Resistant     GENTAMICIN SYNERGY SENSITIVE Sensitive     LINEZOLID 2 SENSITIVE Sensitive     * FEW VANCOMYCIN RESISTANT ENTEROCOCCUS    Studies/Results: Dg Chest Port 1 View  Result Date: 07/17/2016 CLINICAL DATA:  Status post PICC line placement on the right. History of COPD, CHF, coronary artery disease, current smoker. EXAM: PORTABLE CHEST 1 VIEW COMPARISON:  Chest x-ray of July 08, 2016. FINDINGS: The lungs are adequately inflated. There is no postprocedure pneumothorax or hemothorax. The tip of the PICC line projects over the midportion of the SVC. There is no pleural effusion. The heart is top-normal in size but stable. There is mild central pulmonary vascular prominence. There is calcification in the wall of the aortic arch. IMPRESSION: There is no postprocedure complication following PICC line placement on the right. No acute cardiopulmonary abnormality. Thoracic aortic atherosclerosis. Electronically Signed   By: Beya Tipps  [place] M.D.   On: 07/17/2016 14:30    Assessment/Plan: Damon [name] a 66 y.o.malewith clostridial sepsis from sacral ulcer and gluteal abscess. Now s/p I and D 07/12/16 with cx also growing VRE (R to amp as well). Started on linezolid.  Course complicated by ARF. He has an impressive wound and will need several weeks of IV unasyn  but can likely do a short course of oral linezolid.  PICC placed. Day 3 of linezolid Recommendations Cont Unasyn BID - may need to adjust frequency based on renal function.  If renal function improves enough that he gets back to Q 6 hours dosing of unasyn it would probably be easier to just switch to zosyn so can be given Q 8 - plan on 6 week course - see abx order sheet  Continue linezolid while inpatient but given that he has had adequate surgical debridement and source control and only light growth of VRE, in a mixed infection, I think he could be treated with a short 4-5 day course of linezolid.  It is not recommended to use long term given marrow suppression - he has already had transfusion for anemia.   Cont wound care per surgery.   Thank you very much for the consult. Will follow with you.  Anastazia Creek P   07/17/2016, 3:40 PM

## 2016-07-17 NOTE — Clinical Social Work Note (Addendum)
CSW spoke to Chiropodist of social work Geographical information systems officer and Wellsite geologist Dr. Judithann Sheen to discuss difficulty in finding placement for patient due to Zyvox IV antibiotic.  [facility] in [place] and [facility] reviewed patient's information and inquired if patient can be on a different antibiotic.  Medical director stated he will contact infectious disease to discuss patient's medication.  If medication can not be changed, Chiropodist of social work [employer] will assist with looking for a difficult to place SNF bed, CSW updated patient on situation regarding medication.  If medication can be changed assistant director Wandra Mannan has approved a 30 day LOG since patient will need 4-6 weeks of IV antibiotics and some short term rehab.  CSW to continue to follow patient's progress throughout discharge planning.  Ervin Knack. Butler Vegh, MSW, Theresia Majors [phone]  07/17/2016 4:41 PM

## 2016-07-17 NOTE — Progress Notes (Signed)
Infectious Disease Long Term IV Antibiotic Orders  Diagnosis: Buttock abscess, decub ulcer, bacteremia clostridial   Culture results 3/3/1 Clostridium bacteremia  4/5 Order Status: Completed Specimen: Wound from [facility] Other Updated: 07/17/16 1509   Specimen Description [facility]   Special Requests NONE   Gram Stain --   RARE WBC PRESENT, PREDOMINANTLY PMN  RARE GRAM POSITIVE COCCI IN PAIRS  Performed at [facility], [address] [place], [place] [address]    Culture --   FEW ENTEROCOCCUS FAECIUM  SUSCEPTIBILITIES PERFORMED ON PREVIOUS CULTURE WITHIN THE LAST 5 DAYS.  RARE CLOSTRIDIUM RAMOSUM  RARE PEPTOSTREPTOCOCCUS SPECIES    Report Status 07/17/2016 FINAL   Lab Results  Component Value Date   ESRSEDRATE 125 (H) 07/09/2016   Lab Results  Component Value Date   CRP 31.8 (H) 07/09/2016   Lab Results  Component Value Date   CREATININE 2.65 (H) 07/17/2016   Lab Results  Component Value Date   WBC 11.6 (H) 07/17/2016   HGB 9.5 (L) 07/17/2016   HCT 28.8 (L) 07/17/2016   MCV 89.2 07/17/2016   PLT 363 07/17/2016   Allergies: No Known Allergies Discharge antibiotics FAX weekly labs to [phone] Unasyn 3 gm IV q 12 hours - please dose adjust to more frequent dosing if Cr improves  - will need pharmacy to follow with weekly labs and adjust as needed PICC Care per protocol Labs weekly while on IV antibiotics      CBC w diff   Comprehensive met panel ESR  CRP  Planned duration of antibiotics 6 weeks from 07/12/16 Stop date Aug 23 2016  Follow up clinic date 2-3 weeks   Leonel Ramsay, MD

## 2016-07-17 NOTE — Progress Notes (Signed)
qPhysical Therapy Treatment Patient Details Name: Damon [name] MRN: [id number] DOB: 11-03-1950 Today's Date: 07/17/2016    History of Present Illness 66 yo male readmitted from [facility] 07/08/16 w/ fever and hyperglycemia, and non-infected ulcers on the L foot. Sepsis from sacral wound, recent R BK amputation, now had I and D of sacral wound. PMH includes; CAD, CHF, CKD, COPD, DM, HTN, PAD, ischemic cardiomyopathy, LBBB, and diverticulosis.     PT Comments    Pt agreeable to PT for supine exercises. Pt denies pain currently. Refuses out of bed mobility due to discomfort with sitting. Pt has questions regarding prosthetic leg and who he talks to to start the process. Advised pt that his surgeon would probably be the best option; also noted that pt should discuss with doctors currently involved with his care, as this may not be indicated at this time due to other medical complications. Pt states he understands, however, pt again asks therapist the same question toward the end of the session. Pt participates in supine exercises with assist as needed primarily on the left; pt has limited range and strength throughout Bilateral lower extremities. Continue PT to progress strength and endurance to improve functional mobility.    Follow Up Recommendations  SNF     Equipment Recommendations  None recommended by PT    Recommendations for Other Services       Precautions / Restrictions Precautions Precautions: Fall Precaution Comments: sacral decub (R BKA) Restrictions Weight Bearing Restrictions: No (R BKA, no prosthesis)    Mobility  Bed Mobility               General bed mobility comments: Not tested; refused up in chair  Transfers                    Ambulation/Gait                 Stairs            Wheelchair Mobility    Modified Rankin (Stroke Patients Only)       Balance                                            Cognition  Arousal/Alertness: Awake/alert Behavior During Therapy: WFL for tasks assessed/performed Overall Cognitive Status: Within Functional Limits for tasks assessed                                        Exercises General Exercises - Lower Extremity Ankle Circles/Pumps: AAROM;Left;20 reps;Supine Quad Sets: Strengthening;Both;15 reps;Seated (difficulty/poor QS) Gluteal Sets: Strengthening;Both;10 reps;Supine Short Arc Quad: AROM;Strengthening;Both;20 reps;Supine Heel Slides: AROM;Both;20 reps;Supine (limited range; encouraged self assist) Hip ABduction/ADduction: AROM;Both;20 reps;Supine;AAROM (AAROM on L) Straight Leg Raises: AAROM;AROM;Both;20 reps;Supine (AAROM on L)    General Comments        Pertinent Vitals/Pain Pain Assessment: No/denies pain    Home Living                      Prior Function            PT Goals (current goals can now be found in the care plan section) Progress towards PT goals: Progressing toward goals (slowly)    Frequency    Min 2X/week  PT Plan Current plan remains appropriate    Co-evaluation             End of Session   Activity Tolerance: Other (comment) (limited by weakness) Patient left: in bed;with call bell/phone within reach;with bed alarm set;Other (comment) (boot on L)   PT Visit Diagnosis: Muscle weakness (generalized) (M62.81)     Time: 1610-9604 PT Time Calculation (min) (ACUTE ONLY): 28 min  Charges:  $Therapeutic Exercise: 23-37 mins                    G CodesScot Dock, PTA 07/17/2016, 4:57 PM

## 2016-07-17 NOTE — Progress Notes (Signed)
Nutrition Follow-up  DOCUMENTATION CODES:   Severe malnutrition in context of chronic illness  INTERVENTION:  1. Ensure Enlive po TiD, each supplement provides 350 kcal and 20 grams of protein  -Encouraged pt to consume as much PO as possible during stay and at facility he discharges to. Encouraged and discussed intake of protein to help with wound healing.  NUTRITION DIAGNOSIS:   Malnutrition related to chronic illness as evidenced by moderate depletion of body fat, moderate depletions of muscle mass, percent weight loss. -ongoing  GOAL:   Patient will meet greater than or equal to 90% of their needs -progressing  MONITOR:   PO intake, I & O's, Labs, Supplement acceptance  REASON FOR ASSESSMENT:   Low Braden    ASSESSMENT:   Damon [name]  is a 66 y.o. male who presents with Fever and hyperglycemia. Patient had 103.1 fever per EMS, here in the ED was 101. Has an elevated white blood cell count, and is tachycardic  Beef roast, pintos, dinner roll - patient ate ~75% PICC line placed today Asked for ensure during my visit. Labs and medications reviewed.  Diet Order:  Diet Carb Modified Fluid consistency: Thin; Room service appropriate? Yes  Skin:  Wound (see comment) (Stg III PU to sacrum)  Last BM:  07/08/2016  Height:   Ht Readings from Last 1 Encounters:  07/07/16  (1.676 m)    Weight:   Wt Readings from Last 1 Encounters:  07/17/16 164 lb 4.8 oz (74.5 kg)    Ideal Body Weight:  60.7 kg  BMI:  Body mass index is 26.52 kg/m.  Estimated Nutritional Needs:   Kcal:  1850-2150 calories  Protein:  80-92 gm  Fluid:  >/= 1.9L  EDUCATION NEEDS:   No education needs identified at this time  Damon [name] Damon Banke, MS, RD LDN Inpatient Clinical Dietitian Pager [phone]

## 2016-07-17 NOTE — Progress Notes (Signed)
[facility]  ROUNDING NOTE   Subjective:  Sitting up in bed.   PRBC transfusion yesterday 4/9  Eating breakfast when examined.   Objective:  Vital signs in last 24 hours:  Temp:  [97.6 F (36.4 C)-98.1 F (36.7 C)] 98.1 F (36.7 C) (04/10 1610) Pulse Rate:  [69-78] 72 (04/10 1028) Resp:  [18] 18 (04/10 0633) BP: (113-149)/(53-68) 138/58 (04/10 1028) SpO2:  [98 %-100 %] 100 % (04/10 9604) Weight:  [73.8 kg (162 lb 11.2 oz)-74.5 kg (164 lb 4.8 oz)] 74.5 kg (164 lb 4.8 oz) (04/10 5409)  Weight change: -0.091 kg (-3.2 oz) Filed Weights   07/16/16 0356 07/17/16 0600 07/17/16 0633  Weight: 73.9 kg (162 lb 14.4 oz) 73.8 kg (162 lb 11.2 oz) 74.5 kg (164 lb 4.8 oz)    Intake/Output: I/O last 3 completed shifts: In: 338 [Blood:338] Out: 1350 [Urine:1350]   Intake/Output this shift:  No intake/output data recorded.  Physical Exam: General: No acute distress  Head: Normocephalic, atraumatic. 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  Neurologic: Nonfocal, moving all four extremities  Skin: No lesions       Basic Metabolic Panel:  Recent Labs Lab 07/13/16 0400 07/14/16 0458 07/15/16 0714 07/16/16 0511 07/17/16 0820  NA 137 136 133* 134* 136  K 3.2* 3.7 4.3 4.6 4.9  CL 107 108 107 108 111  CO2 21* 20* 18* 19* 18*  GLUCOSE 145* 133* 231* 106* 81  BUN 69* 79* 87* 84* 70*  CREATININE 2.50* 3.13* 3.76* 3.36* 2.65*  CALCIUM 7.9* 7.5* 7.4* 7.7* 8.1*    Liver Function Tests:  Recent Labs Lab 07/16/16 0511  AST 35  ALT 46  ALKPHOS 70  BILITOT 0.7  PROT 5.8*  ALBUMIN 1.6*   No results for input(s): LIPASE, AMYLASE in the last 168 hours. No results for input(s): AMMONIA in the last 168 hours.  CBC:  Recent Labs Lab 07/12/16 1305 07/13/16 0400 07/14/16 0458 07/15/16 0714  07/16/16 0511 07/16/16 1729 07/17/16 0820  WBC 16.2* 15.1* 16.8* 13.1* 11.2*  --  11.6*  NEUTROABS 14.7* 13.7* 15.2*  --  9.5*  --   --   HGB 7.4* 7.7* 7.6* 7.0* 7.1* 9.4* 9.5*  HCT 22.3* 23.3* 23.0* 21.5* 21.7* 27.9* 28.8*  MCV 89.2 90.3 90.8 90.0 91.5  --  89.2  PLT 261 261 276 288 332  --  363    Cardiac Enzymes:  Recent Labs Lab 07/14/16 0458  CKTOTAL 63    BNP: Invalid input(s): POCBNP  CBG:  Recent Labs Lab 07/16/16 1546 07/16/16 2055 07/17/16 0036 07/17/16 0439 07/17/16 0744  GLUCAP 153* 211* 175* 85 79    Microbiology: Results for orders placed or performed during the hospital encounter of 07/07/16  Blood Culture (routine x 2)     Status: Abnormal   Collection Time: 07/07/16 10:07 PM  Result Value Ref Range Status   Specimen Description BLOOD LEFT FOREARM  Final   Special Requests   Final    BOTTLES DRAWN AEROBIC AND ANAEROBIC Blood Culture adequate volume   Culture  Setup Time   Final    GRAM POSITIVE RODS ANAEROBIC BOTTLE ONLY CRITICAL VALUE NOTED.  VALUE IS CONSISTENT WITH PREVIOUSLY REPORTED AND CALLED VALUE.    Culture (A)  Final    CLOSTRIDIUM RAMOSUM Standardized susceptibility testing for  this organism is not available.    Report Status 07/11/2016 FINAL  Final  Blood Culture (routine x 2)     Status: Abnormal   Collection Time: 07/07/16 10:07 PM  Result Value Ref Range Status   Specimen Description BLOOD RIGHT HAND  Final   Special Requests   Final    BOTTLES DRAWN AEROBIC AND ANAEROBIC Blood Culture adequate volume   Culture  Setup Time   Final    Organism ID to follow GRAM POSITIVE RODS ANAEROBIC BOTTLE ONLY CRITICAL RESULT CALLED TO, READ BACK BY AND VERIFIED WITH: NATE COOKSON AT 2046 07/08/16.SDR/PMH    Culture (A)  Final    CLOSTRIDIUM RAMOSUM Standardized susceptibility testing for this organism is not available. Performed at [facility], [address] [place], [place] [address]    Report Status 07/11/2016 FINAL   Final  Blood Culture ID Panel (Reflexed)     Status: None   Collection Time: 07/07/16 10:07 PM  Result Value Ref Range Status   Enterococcus species NOT DETECTED NOT DETECTED Final   Listeria monocytogenes NOT DETECTED NOT DETECTED Final   Staphylococcus species NOT DETECTED NOT DETECTED Final   Staphylococcus aureus NOT DETECTED NOT DETECTED Final   Streptococcus species NOT DETECTED NOT DETECTED Final   Streptococcus agalactiae NOT DETECTED NOT DETECTED Final   Streptococcus pneumoniae NOT DETECTED NOT DETECTED Final   Streptococcus pyogenes NOT DETECTED NOT DETECTED Final   Acinetobacter baumannii NOT DETECTED NOT DETECTED Final   Enterobacteriaceae species NOT DETECTED NOT DETECTED Final   Enterobacter cloacae complex NOT DETECTED NOT DETECTED Final   Escherichia coli NOT DETECTED NOT DETECTED Final   Klebsiella oxytoca NOT DETECTED NOT DETECTED Final   Klebsiella pneumoniae NOT DETECTED NOT DETECTED Final   Proteus species NOT DETECTED NOT DETECTED Final   Serratia marcescens NOT DETECTED NOT DETECTED Final   Haemophilus influenzae NOT DETECTED NOT DETECTED Final   Neisseria meningitidis NOT DETECTED NOT DETECTED Final   Pseudomonas aeruginosa NOT DETECTED NOT DETECTED Final   Candida albicans NOT DETECTED NOT DETECTED Final   Candida glabrata NOT DETECTED NOT DETECTED Final   Candida krusei NOT DETECTED NOT DETECTED Final   Candida parapsilosis NOT DETECTED NOT DETECTED Final   Candida tropicalis NOT DETECTED NOT DETECTED Final  MRSA PCR Screening     Status: None   Collection Time: 07/11/16 11:14 PM  Result Value Ref Range Status   MRSA by PCR NEGATIVE NEGATIVE Final    Comment:        The GeneXpert MRSA Assay (FDA approved for NASAL specimens only), is one component of a comprehensive MRSA colonization surveillance program. It is not intended to diagnose MRSA infection nor to guide or monitor treatment for MRSA infections.   Aerobic/Anaerobic Culture (surgical/deep  wound)     Status: None (Preliminary result)   Collection Time: 07/12/16 10:38 PM  Result Value Ref Range Status   Specimen Description [facility]  Final   Special Requests NONE  Final   Gram Stain   Final    RARE WBC PRESENT, PREDOMINANTLY PMN RARE GRAM POSITIVE COCCI IN PAIRS    Culture   Final    FEW ENTEROCOCCUS FAECIUM SUSCEPTIBILITIES PERFORMED ON PREVIOUS CULTURE WITHIN THE LAST 5 DAYS. HOLDING FOR POSSIBLE ANAEROBE Performed at [facility], [address], [place], [place] [address]    Report Status PENDING  Incomplete  Aerobic/Anaerobic Culture (surgical/deep wound)     Status: None (Preliminary result)   Collection Time: 07/12/16 10:39 PM  Result Value Ref Range Status   Specimen Description [facility]  Final   Special Requests NONE  Final   Gram Stain   Final    RARE WBC PRESENT, PREDOMINANTLY PMN RARE GRAM POSITIVE COCCI IN PAIRS RARE GRAM POSITIVE RODS    Culture   Final    FEW VANCOMYCIN RESISTANT ENTEROCOCCUS HOLDING FOR POSSIBLE ANAEROBE Performed at [facility], [address] [place], [place] [address]    Report Status PENDING  Incomplete   Organism ID, Bacteria VANCOMYCIN RESISTANT ENTEROCOCCUS  Final      Susceptibility   Vancomycin resistant enterococcus - MIC*    AMPICILLIN >=32 RESISTANT Resistant     VANCOMYCIN >=32 RESISTANT Resistant     GENTAMICIN SYNERGY SENSITIVE Sensitive     LINEZOLID 2 SENSITIVE Sensitive     * FEW VANCOMYCIN RESISTANT ENTEROCOCCUS    Coagulation Studies: No results for input(s): LABPROT, INR in the last 72 hours.  Urinalysis: No results for input(s): COLORURINE, LABSPEC, PHURINE, GLUCOSEU, HGBUR, BILIRUBINUR, KETONESUR, PROTEINUR, UROBILINOGEN, NITRITE, LEUKOCYTESUR in the last 72 hours.  Invalid input(s): APPERANCEUR    Imaging: US Renal  Result Date: 07/15/2016 CLINICAL DATA:  Acute renal failure. EXAM: RENAL / URINARY TRACT ULTRASOUND COMPLETE COMPARISON:  Ultrasound on 03/29/2016 and CT on  07/11/2016 FINDINGS: Right Kidney: Length: 9.7 cm. Echogenicity within normal limits. No mass or hydronephrosis visualized. Left Kidney: Length: 10.8 cm. Echogenicity within normal limits. No mass or hydronephrosis visualized. Bladder: Mild diffuse bladder wall thickening, without evidence of focal mass. IMPRESSION: Normal appearance of both kidneys.  No evidence of hydronephrosis. Mild diffuse bladder wall thickening, which may be due to chronic bladder outlet obstruction or cystitis. Electronically Signed   By: Myles Rosenthal M.D.   On: 07/15/2016 15:44     Medications:    . ampicillin-sulbactam (UNASYN) IV  3 g Intravenous Q12H  . atorvastatin  80 mg Oral q1800  . carvedilol  6.25 mg Oral BID WC  . feeding supplement (ENSURE ENLIVE)  237 mL Oral BID BM  . heparin subcutaneous  5,000 Units Subcutaneous Q8H  . insulin aspart  0-15 Units Subcutaneous Q4H  . insulin aspart  0-5 Units Subcutaneous QHS  . insulin aspart  3 Units Subcutaneous TID  . insulin glargine  14 Units Subcutaneous QHS  . iopamidol  30 mL Oral Once  . linezolid (ZYVOX) IV  600 mg Intravenous Q12H  . pantoprazole  40 mg Oral Daily  . tamsulosin  0.4 mg Oral Daily   acetaminophen **OR** acetaminophen, albuterol, HYDROcodone-acetaminophen, morphine injection, ondansetron **OR** ondansetron (ZOFRAN) IV  Assessment/ Plan:  66 y.o.black male with cardiomyopathy with ejection pressure 30-35%, coronary artery disease status post stent placement, hypertension, peripheral arterial disease status post right BKA, lower extremity edema, diabetes mellitus type 2, admitted with severe sacral decubitis ulcer now with ARF.   1. 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 Baseline Cr 1.2 04/2016. Follows with Dr. Cherylann Ratel as outpatient.   - Change IV fluids to sodium bicarbonate 16mL/hr   2. Hyponatremia: improving     3. Anemia of chronic kidney disease: Hemoglobin 9.5. PRBC transfusion  4/9  4. Sacral decubitus ulcer and abscess: status post incision and drainage on 4/5 Dr. Everlene Farrier - Unasyn and linezolid   LOS: 10 Natale Barba 4/10/201811:05 AM

## 2016-07-17 NOTE — Progress Notes (Signed)
Peripherally Inserted Central Catheter/Midline Placement  The IV Nurse has discussed with the patient and/or persons authorized to consent for the patient, the purpose of this procedure and the potential benefits and risks involved with this procedure.  The benefits include less needle sticks, lab draws from the catheter, and the patient may be discharged home with the catheter. Risks include, but not limited to, infection, bleeding, blood clot (thrombus formation), and puncture of an artery; nerve damage and irregular heartbeat and possibility to perform a PICC exchange if needed/ordered by physician.  Alternatives to this procedure were also discussed.  Bard Power PICC patient education guide, fact sheet on infection prevention and patient information card has been provided to patient /or left at bedside.    PICC/Midline Placement Documentation  PICC Single Lumen 07/17/16 PICC Right Brachial 38 cm 0 cm (Active)  Indication for Insertion or Continuance of Line Home intravenous therapies (PICC only) 07/17/2016  1:45 PM  Exposed Catheter (cm) 0 cm 07/17/2016  1:45 PM  Site Assessment Clean;Dry;Intact 07/17/2016  1:45 PM  Line Status Blood return noted;Flushed;Saline locked 07/17/2016  1:45 PM  Dressing Type Transparent 07/17/2016  1:45 PM  Dressing Status Clean;Dry;Intact;Antimicrobial disc in place 07/17/2016  1:45 PM  Dressing Intervention New dressing 07/17/2016  1:45 PM  Dressing Change Due 07/24/16 07/17/2016  1:45 PM       Netta Corrigan L 07/17/2016, 2:00 PM

## 2016-07-17 NOTE — Anesthesia Postprocedure Evaluation (Signed)
Anesthesia Post Note  Patient: Damon [name]  Procedure(s) Performed: Procedure(s) (LRB): INCISION AND DRAINAGE GLUTEAL  ABSCESS (N/A)  Patient location during evaluation: PACU Anesthesia Type: General Level of consciousness: awake and alert Pain management: pain level controlled Vital Signs Assessment: post-procedure vital signs reviewed and stable Respiratory status: spontaneous breathing, nonlabored ventilation, respiratory function stable and patient connected to nasal cannula oxygen Cardiovascular status: blood pressure returned to baseline and stable Postop Assessment: no signs of nausea or vomiting Anesthetic complications: no     Last Vitals:  Vitals:   07/17/16 1028 07/17/16 1231  BP: (!) 138/58 (!) 145/57  Pulse: 72 81  Resp:  20  Temp:  36.8 C    Last Pain:  Vitals:   07/17/16 1231  TempSrc: Oral  PainSc:                  Yevette Edwards

## 2016-07-17 NOTE — Progress Notes (Addendum)
[employer] [facility] [place] at [facility]   PATIENT NAME: Damon [name]    MR#:  [id number]  DATE OF BIRTH:  1950-12-06  SUBJECTIVE:   Wife is at bedside. No acute events overnight. Creatinine has improved. REVIEW OF SYSTEMS:    Review of Systems  Constitutional: Negative for chills, fever and malaise/fatigue.  HENT: Negative.  Negative for ear discharge, ear pain, hearing loss, nosebleeds and sore throat.   Eyes: Negative.  Negative for blurred vision and pain.  Respiratory: Negative.  Negative for cough, hemoptysis, shortness of breath and wheezing.   Cardiovascular: Negative.  Negative for chest pain, palpitations and leg swelling.  Gastrointestinal: Negative.  Negative for abdominal pain, blood in stool, diarrhea, nausea and vomiting.  Genitourinary: Negative.  Negative for dysuria.  Musculoskeletal: Negative.  Negative for back pain.  Skin:       Gluteal absess  Neurological: Negative for dizziness, tremors, speech change, focal weakness, seizures, weakness and headaches.  Endo/Heme/Allergies: Negative.  Does not bruise/bleed easily.  Psychiatric/Behavioral: Negative.  Negative for depression, hallucinations and suicidal ideas.    Tolerating Diet: yes      DRUG ALLERGIES:  No Known Allergies  VITALS:  Blood pressure (!) 138/58, pulse 72, temperature 98.1 F (36.7 C), temperature source Oral, resp. rate 18, height  (1.676 m), weight 74.5 kg (164 lb 4.8 oz), SpO2 100 %.  PHYSICAL EXAMINATION:   Physical Exam  Constitutional: He is oriented to person, place, and time and well-developed, well-nourished, and in no distress. No distress.  HENT:  Head: Normocephalic.  Eyes: No scleral icterus.  Neck: Normal range of motion. Neck supple. No JVD present. No tracheal deviation present.  Cardiovascular: Normal rate, regular rhythm and normal heart sounds.  Exam reveals no gallop and no friction rub.   No murmur heard. Pulmonary/Chest: Effort normal and breath  sounds normal. No respiratory distress. He has no wheezes. He has no rales. He exhibits no tenderness.  Abdominal: Soft. Bowel sounds are normal. He exhibits no distension and no mass. There is no tenderness. There is no rebound and no guarding.  Musculoskeletal: Normal range of motion. He exhibits no edema.  Crusting and ulcers on the left foot. Right below-knee amputation Boot placed left foot  Neurological: He is alert and oriented to person, place, and time.  Skin: Skin is warm. No rash noted. No erythema.  Psychiatric: Affect and judgment normal.      LABORATORY PANEL:   CBC  Recent Labs Lab 07/17/16 0820  WBC 11.6*  HGB 9.5*  HCT 28.8*  PLT 363   ------------------------------------------------------------------------------------------------------------------  Chemistries   Recent Labs Lab 07/16/16 0511 07/17/16 0820  NA 134* 136  K 4.6 4.9  CL 108 111  CO2 19* 18*  GLUCOSE 106* 81  BUN 84* 70*  CREATININE 3.36* 2.65*  CALCIUM 7.7* 8.1*  AST 35  --   ALT 46  --   ALKPHOS 70  --   BILITOT 0.7  --    ------------------------------------------------------------------------------------------------------------------  Cardiac Enzymes No results for input(s): TROPONINI in the last 168 hours. ------------------------------------------------------------------------------------------------------------------  RADIOLOGY:  US Renal  Result Date: 07/15/2016 CLINICAL DATA:  Acute renal failure. EXAM: RENAL / URINARY TRACT ULTRASOUND COMPLETE COMPARISON:  Ultrasound on 03/29/2016 and CT on 07/11/2016 FINDINGS: Right Kidney: Length: 9.7 cm. Echogenicity within normal limits. No mass or hydronephrosis visualized. Left Kidney: Length: 10.8 cm. Echogenicity within normal limits. No mass or hydronephrosis visualized. Bladder: Mild diffuse bladder wall thickening, without evidence of focal mass. IMPRESSION: Normal  appearance of both kidneys.  No evidence of hydronephrosis. Mild  diffuse bladder wall thickening, which may be due to chronic bladder outlet obstruction or cystitis. Electronically Signed   By: Myles Rosenthal M.D.   On: 07/15/2016 15:44     ASSESSMENT AND PLAN:   66 year old male with history of CAD and chronic kidney disease stage II presented with severe sacral decubitus ulcers and gluteal abscess.  1. Acute on chronic kidney disease stage II:in the setting of sepsis and decubitus ulcer Creatinine is much improved Discussed with nephrology and okay to place PICC   2. Sacral decubitus ulcer and complex gluteal abscess status post incision and drainage on April 5 Continue Unasyn and linezolid (for VRE) Patient will need 4-6 weeks of Unasyn twice a day PICC line is ordered..  discuss with nephrology Continue linezolid for 2 weeks total Patient will need weekly CBCs while on linezolid He will need to follow-up in Dr. Sampson Goon in 2 weeks  3. Sepsis present on admission with Clostridium bacteremia due to sacral Decubitus ulcer andglutealabscesses    4. Acute on chronic anemia: Has been transfused 2 units with stable Hgb this am]  5. Chronic systolic heart failure with ejection fraction 25-30%: No acute signs of exacerbation  6. CAD status post PCI in October: Continue atorvastatin and Coreg  7. Diabetes: On Lantus and  NovoLog 3 units 3 times a day as per recommendations by diabetes coordinator, blood sugars low this am will stop premeal insulin for now.  Continue ADA diet  8. BPH: Continue tamsulosin  Management plans discussed with the patient and wife and he is in agreement.  CODE STATUS: full  TOTAL TIME TAKING CARE OF THIS PATIENT: 27 minutes.   D/w dr Ronn Melena  POSSIBLE D/C  Tomorrow after PICC to SNF, DEPENDING ON CLINICAL CONDITION.   Aeneas Longsworth M.D on 07/17/2016 at 12:13 PM  Between 7am to 6pm - Pager - [phone] After 6pm go to [email or web address] - password [email or web address]  [facility]  Office   [phone]  CC: Primary care physician; Emogene Morgan, MD  Note: This dictation was prepared with Dragon dictation along with smaller phrase technology. Any transcriptional errors that result from this process are unintentional.

## 2016-07-18 LAB — BASIC METABOLIC PANEL
ANION GAP: 5 (ref 5–15)
BUN: 64 mg/dL — ABNORMAL HIGH (ref 6–20)
CO2: 23 mmol/L (ref 22–32)
CREATININE: 2.31 mg/dL — AB (ref 0.61–1.24)
Calcium: 7.9 mg/dL — ABNORMAL LOW (ref 8.9–10.3)
Chloride: 109 mmol/L (ref 101–111)
GFR, EST AFRICAN AMERICAN: 33 mL/min — AB (ref >60)
GFR, EST NON AFRICAN AMERICAN: 28 mL/min — AB (ref >60)
Glucose, Bld: 169 mg/dL — ABNORMAL HIGH (ref 65–99)
Potassium: 5 mmol/L (ref 3.5–5.1)
SODIUM: 137 mmol/L (ref 135–145)

## 2016-07-18 LAB — GLUCOSE, CAPILLARY
GLUCOSE-CAPILLARY: 201 mg/dL — AB (ref 65–99)
GLUCOSE-CAPILLARY: 242 mg/dL — AB (ref 65–99)
GLUCOSE-CAPILLARY: 276 mg/dL — AB (ref 65–99)
Glucose-Capillary: 224 mg/dL — ABNORMAL HIGH (ref 65–99)
Glucose-Capillary: 150 mg/dL — ABNORMAL HIGH (ref 65–99)

## 2016-07-18 MED ORDER — HYDROCODONE-ACETAMINOPHEN 5-325 MG PO TABS: 1.0000 | ORAL_TABLET | Freq: Four times a day (QID) | ORAL | 0 refills | Status: AC | PRN

## 2016-07-18 MED ORDER — SODIUM CHLORIDE 0.9 % IV SOLN: 3.0000 g | Freq: Two times a day (BID) | INTRAVENOUS | 2 refills | Status: DC

## 2016-07-18 MED ORDER — INSULIN ASPART 100 UNIT/ML ~~LOC~~ SOLN
0.0000 [IU] | Freq: Three times a day (TID) | SUBCUTANEOUS | Status: DC
  Administered 2016-07-19 (×2): 5 [IU] via SUBCUTANEOUS
  Administered 2016-07-20: 3 [IU] via SUBCUTANEOUS
  Filled 2016-07-18: qty 3
  Filled 2016-07-18 (×2): qty 5

## 2016-07-18 MED ORDER — INSULIN GLARGINE 100 UNIT/ML ~~LOC~~ SOLN: 14.0000 [IU] | Freq: Every day | SUBCUTANEOUS | 11 refills | Status: DC

## 2016-07-18 NOTE — Care Management (Signed)
CM spoke with patient and his wife this morning.  Ms Pettengill is going to patient's employer on Friday and sign up for COBRA. she says she told patient "way before his insurance expired he needed to sign up."   She discussed considering discussion with [facility] for patient to return to that facility if could get COBRA.  CM later found a discharge order.  Spoke with ID to confirm the discussion with physician mentioned in earlier note by CM. ID says that it will not be necessary to reorder culture and patient can discharge without the [employer].  Updated CSW and informed her of discussion with wife earlier this morning and at that time a discharge today was not apparent.  CSW is working with administration on facility placement.

## 2016-07-18 NOTE — Care Management (Signed)
Physician advisor has spoken with ID.  Will reculture wounds and it may be that the patient will not need to continue IV antibiotics or can transition to oral.  Will also investigate the cost of alternative medication- daptomycin.

## 2016-07-18 NOTE — Progress Notes (Signed)
Patient has no bed offers at this time. Clinical Child psychotherapist (CSW) Interior and spatial designer is discussing Environmental manager with [employer]. CSW will continue to follow and assist as needed.   [employer], LCSW [phone]

## 2016-07-18 NOTE — Progress Notes (Signed)
[facility]  ROUNDING NOTE   Subjective:   Creatinine 2.31 (2.65)  Sodium bicarb gtt  UOP 2300  Objective:  Vital signs in last 24 hours:  Temp:  [98.1 F (36.7 C)-98.3 F (36.8 C)] 98.1 F (36.7 C) (04/11 0905) Pulse Rate:  [68-81] 79 (04/11 0905) Resp:  [18-20] 18 (04/11 0905) BP: (137-158)/(53-75) 158/73 (04/11 0905) SpO2:  [98 %-100 %] 100 % (04/11 0905) Weight:  [77.1 kg (169 lb 14.4 oz)] 77.1 kg (169 lb 14.4 oz) (04/11 0411)  Weight change: 3.266 kg (7 lb 3.2 oz) Filed Weights   07/17/16 0600 07/17/16 0633 07/18/16 0411  Weight: 73.8 kg (162 lb 11.2 oz) 74.5 kg (164 lb 4.8 oz) 77.1 kg (169 lb 14.4 oz)    Intake/Output: I/O last 3 completed shifts: In: 1794.2 [I.V.:1794.2] Out: 3200 [Urine:3200]   Intake/Output this shift:  No intake/output data recorded.  Physical Exam: General: No acute distress  Head: Normocephalic, atraumatic. 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  Neurologic: Nonfocal, moving all four extremities  Skin: No lesions       Basic Metabolic Panel:  Recent Labs Lab 07/14/16 0458 07/15/16 0714 07/16/16 0511 07/17/16 0820 07/18/16 0448  NA 136 133* 134* 136 137  K 3.7 4.3 4.6 4.9 5.0  CL 108 107 108 111 109  CO2 20* 18* 19* 18* 23  GLUCOSE 133* 231* 106* 81 169*  BUN 79* 87* 84* 70* 64*  CREATININE 3.13* 3.76* 3.36* 2.65* 2.31*  CALCIUM 7.5* 7.4* 7.7* 8.1* 7.9*    Liver Function Tests:  Recent Labs Lab 07/16/16 0511  AST 35  ALT 46  ALKPHOS 70  BILITOT 0.7  PROT 5.8*  ALBUMIN 1.6*   No results for input(s): LIPASE, AMYLASE in the last 168 hours. No results for input(s): AMMONIA in the last 168 hours.  CBC:  Recent Labs Lab 07/12/16 1305 07/13/16 0400 07/14/16 0458 07/15/16 0714 07/16/16 0511 07/16/16 1729 07/17/16 0820   WBC 16.2* 15.1* 16.8* 13.1* 11.2*  --  11.6*  NEUTROABS 14.7* 13.7* 15.2*  --  9.5*  --   --   HGB 7.4* 7.7* 7.6* 7.0* 7.1* 9.4* 9.5*  HCT 22.3* 23.3* 23.0* 21.5* 21.7* 27.9* 28.8*  MCV 89.2 90.3 90.8 90.0 91.5  --  89.2  PLT 261 261 276 288 332  --  363    Cardiac Enzymes:  Recent Labs Lab 07/14/16 0458  CKTOTAL 63    BNP: Invalid input(s): POCBNP  CBG:  Recent Labs Lab 07/17/16 1149 07/17/16 1554 07/17/16 2102 07/18/16 0020 07/18/16 0748  GLUCAP 157* 263* 363* 276* 201*    Microbiology: Results for orders placed or performed during the hospital encounter of 07/07/16  Blood Culture (routine x 2)     Status: Abnormal   Collection Time: 07/07/16 10:07 PM  Result Value Ref Range Status   Specimen Description BLOOD LEFT FOREARM  Final   Special Requests   Final    BOTTLES DRAWN AEROBIC AND ANAEROBIC Blood Culture adequate volume   Culture  Setup Time   Final    GRAM POSITIVE RODS ANAEROBIC BOTTLE ONLY CRITICAL VALUE NOTED.  VALUE IS CONSISTENT WITH PREVIOUSLY REPORTED AND CALLED VALUE.    Culture (A)  Final    CLOSTRIDIUM RAMOSUM Standardized susceptibility testing for this organism is not available.  Report Status 07/11/2016 FINAL  Final  Blood Culture (routine x 2)     Status: Abnormal   Collection Time: 07/07/16 10:07 PM  Result Value Ref Range Status   Specimen Description BLOOD RIGHT HAND  Final   Special Requests   Final    BOTTLES DRAWN AEROBIC AND ANAEROBIC Blood Culture adequate volume   Culture  Setup Time   Final    Organism ID to follow GRAM POSITIVE RODS ANAEROBIC BOTTLE ONLY CRITICAL RESULT CALLED TO, READ BACK BY AND VERIFIED WITH: NATE COOKSON AT 2046 07/08/16.SDR/PMH    Culture (A)  Final    CLOSTRIDIUM RAMOSUM Standardized susceptibility testing for this organism is not available. Performed at [facility], [address] [place], [place] [address]    Report Status 07/11/2016 FINAL  Final  Blood Culture ID Panel (Reflexed)      Status: None   Collection Time: 07/07/16 10:07 PM  Result Value Ref Range Status   Enterococcus species NOT DETECTED NOT DETECTED Final   Listeria monocytogenes NOT DETECTED NOT DETECTED Final   Staphylococcus species NOT DETECTED NOT DETECTED Final   Staphylococcus aureus NOT DETECTED NOT DETECTED Final   Streptococcus species NOT DETECTED NOT DETECTED Final   Streptococcus agalactiae NOT DETECTED NOT DETECTED Final   Streptococcus pneumoniae NOT DETECTED NOT DETECTED Final   Streptococcus pyogenes NOT DETECTED NOT DETECTED Final   Acinetobacter baumannii NOT DETECTED NOT DETECTED Final   Enterobacteriaceae species NOT DETECTED NOT DETECTED Final   Enterobacter cloacae complex NOT DETECTED NOT DETECTED Final   Escherichia coli NOT DETECTED NOT DETECTED Final   Klebsiella oxytoca NOT DETECTED NOT DETECTED Final   Klebsiella pneumoniae NOT DETECTED NOT DETECTED Final   Proteus species NOT DETECTED NOT DETECTED Final   Serratia marcescens NOT DETECTED NOT DETECTED Final   Haemophilus influenzae NOT DETECTED NOT DETECTED Final   Neisseria meningitidis NOT DETECTED NOT DETECTED Final   Pseudomonas aeruginosa NOT DETECTED NOT DETECTED Final   Candida albicans NOT DETECTED NOT DETECTED Final   Candida glabrata NOT DETECTED NOT DETECTED Final   Candida krusei NOT DETECTED NOT DETECTED Final   Candida parapsilosis NOT DETECTED NOT DETECTED Final   Candida tropicalis NOT DETECTED NOT DETECTED Final  MRSA PCR Screening     Status: None   Collection Time: 07/11/16 11:14 PM  Result Value Ref Range Status   MRSA by PCR NEGATIVE NEGATIVE Final    Comment:        The GeneXpert MRSA Assay (FDA approved for NASAL specimens only), is one component of a comprehensive MRSA colonization surveillance program. It is not intended to diagnose MRSA infection nor to guide or monitor treatment for MRSA infections.   Aerobic/Anaerobic Culture (surgical/deep wound)     Status: None   Collection Time:  07/12/16 10:38 PM  Result Value Ref Range Status   Specimen Description [facility]  Final   Special Requests NONE  Final   Gram Stain   Final    RARE WBC PRESENT, PREDOMINANTLY PMN RARE GRAM POSITIVE COCCI IN PAIRS Performed at [facility], [address], [place], [place] [address]    Culture   Final    FEW ENTEROCOCCUS FAECIUM SUSCEPTIBILITIES PERFORMED ON PREVIOUS CULTURE WITHIN THE LAST 5 DAYS. RARE CLOSTRIDIUM RAMOSUM RARE PEPTOSTREPTOCOCCUS SPECIES    Report Status 07/17/2016 FINAL  Final  Aerobic/Anaerobic Culture (surgical/deep wound)     Status: None   Collection Time: 07/12/16 10:39 PM  Result Value Ref Range Status   Specimen  Description [facility]  Final   Special Requests NONE  Final   Gram Stain   Final    RARE WBC PRESENT, PREDOMINANTLY PMN RARE GRAM POSITIVE COCCI IN PAIRS RARE GRAM POSITIVE RODS Performed at [facility], [address] [place], [place] [address]    Culture   Final    FEW VANCOMYCIN RESISTANT ENTEROCOCCUS FEW CLOSTRIDIUM RAMOSUM    Report Status 07/17/2016 FINAL  Final   Organism ID, Bacteria VANCOMYCIN RESISTANT ENTEROCOCCUS  Final      Susceptibility   Vancomycin resistant enterococcus - MIC*    AMPICILLIN >=32 RESISTANT Resistant     VANCOMYCIN >=32 RESISTANT Resistant     GENTAMICIN SYNERGY SENSITIVE Sensitive     LINEZOLID 2 SENSITIVE Sensitive     * FEW VANCOMYCIN RESISTANT ENTEROCOCCUS    Coagulation Studies: No results for input(s): LABPROT, INR in the last 72 hours.  Urinalysis: No results for input(s): COLORURINE, LABSPEC, PHURINE, GLUCOSEU, HGBUR, BILIRUBINUR, KETONESUR, PROTEINUR, UROBILINOGEN, NITRITE, LEUKOCYTESUR in the last 72 hours.  Invalid input(s): APPERANCEUR    Imaging: Dg Chest Port 1 View  Result Date: 07/17/2016 CLINICAL DATA:  Status post PICC line placement on the right. History of COPD, CHF, coronary artery disease, current smoker. EXAM: PORTABLE CHEST 1 VIEW COMPARISON:  Chest x-ray of  July 08, 2016. FINDINGS: The lungs are adequately inflated. There is no postprocedure pneumothorax or hemothorax. The tip of the PICC line projects over the midportion of the SVC. There is no pleural effusion. The heart is top-normal in size but stable. There is mild central pulmonary vascular prominence. There is calcification in the wall of the aortic arch. IMPRESSION: There is no postprocedure complication following PICC line placement on the right. No acute cardiopulmonary abnormality. Thoracic aortic atherosclerosis. Electronically Signed   By: David  [place] M.D.   On: 07/17/2016 14:30     Medications:   .  sodium bicarbonate  infusion 1000 mL 50 mL/hr at 07/18/16 0912   . ampicillin-sulbactam (UNASYN) IV  3 g Intravenous Q12H  . atorvastatin  80 mg Oral q1800  . carvedilol  6.25 mg Oral BID WC  . feeding supplement (ENSURE ENLIVE)  237 mL Oral TID BM  . heparin subcutaneous  5,000 Units Subcutaneous Q8H  . insulin aspart  0-15 Units Subcutaneous Q4H  . insulin aspart  0-5 Units Subcutaneous QHS  . insulin glargine  14 Units Subcutaneous QHS  . iopamidol  30 mL Oral Once  . linezolid (ZYVOX) IV  600 mg Intravenous Q12H  . pantoprazole  40 mg Oral Daily  . tamsulosin  0.4 mg Oral Daily   acetaminophen **OR** acetaminophen, albuterol, HYDROcodone-acetaminophen, morphine injection, ondansetron **OR** ondansetron (ZOFRAN) IV, sodium chloride flush  Assessment/ Plan:  66 y.o.black male with cardiomyopathy with ejection pressure 30-35%, coronary artery disease status post stent placement, hypertension, peripheral arterial disease status post right BKA, lower extremity edema, diabetes mellitus type 2, admitted with severe sacral decubitis ulcer now with ARF.   1. 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 Baseline Cr 1.2 04/2016. Follows with Dr. Cherylann Ratel as outpatient.   - discontinue IV fluids - encourage PO intake.   2. Hyponatremia:  improving  Na 137.    3. Anemia of chronic kidney disease: Hemoglobin 9.5. PRBC transfusion 4/9  4. Sacral decubitus ulcer and abscess: status post incision and drainage on 4/5 Dr. Everlene Farrier - Appreciate ID input.  - Unasyn     LOS: 11 Leesha Veno 4/11/20189:33  AM

## 2016-07-18 NOTE — Discharge Summary (Signed)
[place] [facility] [place] NAME: Damon [name]    MR#:  [id number]  DATE OF BIRTH:  Mar 29, 1951  DATE OF ADMISSION:  07/07/2016 ADMITTING PHYSICIAN: Lance Coon, MD  DATE OF DISCHARGE: 07/18/2016  PRIMARY CARE PHYSICIAN: Donnie Coffin, MD    ADMISSION DIAGNOSIS:  Sepsis, due to unspecified organism ([place]) [A41.9]  DISCHARGE DIAGNOSIS:  Principal Problem:   Sepsis ([place]) Active Problems:   Coronary artery disease   Essential hypertension   HLD (hyperlipidemia)   Diabetes mellitus type 2 with complications (HCC)   Chronic systolic heart failure (HCC)   PAD (peripheral artery disease) (HCC)   Ulcer of left foot (HCC)   Abscess, gluteal, left   Necrotizing soft tissue infection ([place])   SECONDARY DIAGNOSIS:   Past Medical History:  Diagnosis Date  . CAD (coronary artery disease)    a. 01/2016 MI/PCI: LM nl, LAD 153md (3.25 x 28 Xience DES), 40d, LCX 30ost, OM1 95 (staged - 2.75 x 18 Xience Alpine DES), OM2/3 min irregs, RCA 8318m . Chronic combined systolic and diastolic CHF (congestive heart failure) ([facility]   a. 01/2016 Echo: Ef 25-30%, Gr1 DD, mild AI, mildly dil LA;  b. 02/2016 Echo: EF 30-35%, apical AK, antsept and ant HK, nl RV fxn.  . CKD (chronic kidney disease), stage III   . COPD (chronic obstructive pulmonary disease) ([facility]  . Diabetes mellitus without complication ([facility]  . Diverticulitis   . Essential hypertension   . Ischemic cardiomyopathy    a. 01/2016 Echo: Ef 25-30%;  b. 02/2016 Echo: EF 30-35%.  . Marland KitchenBBB (left bundle branch block)   . PAD (peripheral artery disease) ([facility] [place]   a. 10/2015 s/p PTA and stenting of the RCIA, LCIA, and PTA of the LEIA ([place]); b. 04/2016 PTA or R tibioperoneal trunk & prox PT, PTA/DBA of R Pop and SFA, Viabahn covered stent x 2 to the R SFA and pop (18m22m 25 cm & 18mm30m15cm).  . Tobacco abuse     HOSPITAL COURSE:   66 y59r old male with history of CAD and chronic kidney disease stage II  presented with severe sacral decubitus ulcers and gluteal abscess.  1. Acute on chronic kidney disease stage II:in the setting of sepsis and decubitus ulcer Creatinine has improved He will follow up with Dr LateHolley Raring1 week. Hold nephrotoxic agents.    2. Sacral decubitus ulcer and complex gluteal abscess status post incision and drainage on April 5 Continue Unasyn with planned stop date 08/23/2016. He has PICC right arm. He was also treated with Linezolid for VRE and completed course of therapy. Labs weekly while on IV antibiotics      CBC w diff         Comprehensive met panel ESR  CRP He will need to follow-up in Dr. FitzOla Spurr2 weeks  3. Sepsis present on admission with Clostridium bacteremia due to sacral Decubitus ulcer and gluteal abscesses    4. Acute on chronic anemia: Has been transfused 2 units with stable Hgb.  5. Chronic systolic heart failure with ejection fraction 25-30%: No acute signs of exacerbation  6. CAD status post PCI in October: Continue atorvastatin and Coreg  7. Diabetes: Continue Lantus and ADA diet.   8. BPH: Continue tamsulosin   DISCHARGE CONDITIONS AND DIET:   Stable Diabetic diet  CONSULTS OBTAINED:  Treatment Team:  JustSamara DeistM SnehEpifanio Lesches DaviLeonel Ramsay CharClayburn Pert Munsoor  Holley Raring, MD  DRUG ALLERGIES:  No Known Allergies  DISCHARGE MEDICATIONS:   Current Discharge Medication List    START taking these medications   Details  Ampicillin-Sulbactam 3 g in sodium chloride 0.9 % 100 mL Inject 3 g into the vein every 12 (twelve) hours. Qty: 300 g, Refills: 2    HYDROcodone-acetaminophen (NORCO/VICODIN) 5-325 MG tablet Take 1 tablet by mouth every 6 (six) hours as needed for moderate pain. Qty: 30 tablet, Refills: 0      CONTINUE these medications which have CHANGED   Details  insulin glargine (LANTUS) 100 UNIT/ML injection Inject 0.14 mLs (14 Units total) into the skin at  bedtime. Qty: 10 mL, Refills: 11      CONTINUE these medications which have NOT CHANGED   Details  acetaminophen (TYLENOL) 500 MG tablet Take 500 mg by mouth every 6 (six) hours as needed.    albuterol-ipratropium (COMBIVENT) 18-103 MCG/ACT inhaler Inhale into the lungs every 4 (four) hours as needed for wheezing or shortness of breath.    atorvastatin (LIPITOR) 80 MG tablet Take 1 tablet (80 mg total) by mouth daily at 6 PM. Qty: 30 tablet, Refills: 2    carvedilol (COREG) 12.5 MG tablet Take 1 tablet (12.5 mg total) by mouth 2 (two) times daily with a meal. Qty: 60 tablet, Refills: 30    clopidogrel (PLAVIX) 75 MG tablet Take 1 tablet (75 mg total) by mouth daily. Qty: 30 tablet, Refills: 0    feeding supplement, GLUCERNA SHAKE, (GLUCERNA SHAKE) LIQD Take 237 mLs by mouth 3 (three) times daily between meals. Qty: 30 Can, Refills: 0    ferrous sulfate 325 (65 FE) MG tablet Take 1 tablet (325 mg total) by mouth 3 (three) times daily with meals. Refills: 2    gabapentin (NEURONTIN) 300 MG capsule Take 300 mg by mouth daily as needed.     hydrALAZINE (APRESOLINE) 25 MG tablet Take 1 tablet (25 mg total) by mouth every 8 (eight) hours. Qty: 30 tablet, Refills: 0    omeprazole (PRILOSEC) 20 MG capsule Take 20 mg by mouth daily.    ondansetron (ZOFRAN) 4 MG tablet Take 4 mg by mouth every 8 (eight) hours as needed for nausea or vomiting.    polyethylene glycol (MIRALAX / GLYCOLAX) packet Take 17 g by mouth daily.    sennosides-docusate sodium (SENOKOT-S) 8.6-50 MG tablet Take 1 tablet by mouth daily.    tamsulosin (FLOMAX) 0.4 MG CAPS capsule Take 1 capsule (0.4 mg total) by mouth daily. Qty: 30 capsule, Refills: 0      STOP taking these medications     ciprofloxacin (CIPRO) 500 MG tablet      collagenase (SANTYL) ointment      lisinopril (PRINIVIL,ZESTRIL) 40 MG tablet      metFORMIN (GLUCOPHAGE) 500 MG tablet      traMADol (ULTRAM) 50 MG tablet       amoxicillin-clavulanate (AUGMENTIN) 875-125 MG tablet      aspirin EC 81 MG tablet      oxyCODONE-acetaminophen (PERCOCET/ROXICET) 5-325 MG tablet           Today   CHIEF COMPLAINT:  Doing well no issues overnight   VITAL SIGNS:  Blood pressure (!) 158/73, pulse 79, temperature 98.1 F (36.7 C), temperature source Oral, resp. rate 18, height '5\' 6"'  (1.676 m), weight 77.1 kg (169 lb 14.4 oz), SpO2 100 %.   REVIEW OF SYSTEMS:  Review of Systems  Constitutional: Negative.  Negative for chills, fever and malaise/fatigue.  HENT:  Negative.  Negative for ear discharge, ear pain, hearing loss, nosebleeds and sore throat.   Eyes: Negative.  Negative for blurred vision and pain.  Respiratory: Negative.  Negative for cough, hemoptysis, shortness of breath and wheezing.   Cardiovascular: Negative.  Negative for chest pain, palpitations and leg swelling.  Gastrointestinal: Negative.  Negative for abdominal pain, blood in stool, diarrhea, nausea and vomiting.  Genitourinary: Negative.  Negative for dysuria.  Musculoskeletal: Negative.  Negative for back pain.       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  Endo/Heme/Allergies: Negative.  Does not bruise/bleed easily.  Psychiatric/Behavioral: Negative.  Negative for depression, hallucinations and suicidal ideas.     PHYSICAL EXAMINATION:  GENERAL:  66 y.o.-year-old patient lying in the bed with no acute distress.  NECK:  Supple, no jugular venous distention. No thyroid enlargement, no tenderness.  LUNGS: Normal breath sounds bilaterally, no wheezing, rales,rhonchi  No use of accessory muscles of respiration.  CARDIOVASCULAR: S1, S2 normal. No murmurs, rubs, or gallops.  ABDOMEN: Soft, non-tender, non-distended. Bowel sounds present. No organomegaly or mass.  EXTREMITIES: No pedal edema, cyanosis, or clubbing.  Right BKA PSYCHIATRIC: The patient is alert and oriented x 3.  SKIN: sacral ulcer/decub  DATA REVIEW:   CBC  Recent Labs Lab 07/17/16 0820  WBC 11.6*  HGB 9.5*  HCT 28.8*  PLT 363    Chemistries   Recent Labs Lab 07/16/16 0511  07/18/16 0448  NA 134*  < > 137  K 4.6  < > 5.0  CL 108  < > 109  CO2 19*  < > 23  GLUCOSE 106*  < > 169*  BUN 84*  < > 64*  CREATININE 3.36*  < > 2.31*  CALCIUM 7.7*  < > 7.9*  AST 35  --   --   ALT 46  --   --   ALKPHOS 70  --   --   BILITOT 0.7  --   --   < > = values in this interval not displayed.  Cardiac Enzymes No results for input(s): TROPONINI in the last 168 hours.  Microbiology Results  '@[id number]' @  RADIOLOGY:  Dg Chest Port 1 View  Result Date: 07/17/2016 CLINICAL DATA:  Status post PICC line placement on the right. History of COPD, CHF, coronary artery disease, current smoker. EXAM: PORTABLE CHEST 1 VIEW COMPARISON:  Chest x-ray of July 08, 2016. FINDINGS: The lungs are adequately inflated. There is no postprocedure pneumothorax or hemothorax. The tip of the PICC line projects over the midportion of the SVC. There is no pleural effusion. The heart is top-normal in size but stable. There is mild central pulmonary vascular prominence. There is calcification in the wall of the aortic arch. IMPRESSION: There is no postprocedure complication following PICC line placement on the right. No acute cardiopulmonary abnormality. Thoracic aortic atherosclerosis. Electronically Signed   By: David  Martinique M.D.   On: 07/17/2016 14:30      Current Discharge Medication List    START taking these medications   Details  Ampicillin-Sulbactam 3 g in sodium chloride 0.9 % 100 mL Inject 3 g into the vein every 12 (twelve) hours. Qty: 300 g, Refills: 2    HYDROcodone-acetaminophen (NORCO/VICODIN) 5-325 MG tablet Take 1 tablet by mouth every 6 (six) hours as needed for moderate pain. Qty: 30 tablet, Refills: 0  CONTINUE these medications  which have CHANGED   Details  insulin glargine (LANTUS) 100 UNIT/ML injection Inject 0.14 mLs (14 Units total) into the skin at bedtime. Qty: 10 mL, Refills: 11      CONTINUE these medications which have NOT CHANGED   Details  acetaminophen (TYLENOL) 500 MG tablet Take 500 mg by mouth every 6 (six) hours as needed.    albuterol-ipratropium (COMBIVENT) 18-103 MCG/ACT inhaler Inhale into the lungs every 4 (four) hours as needed for wheezing or shortness of breath.    atorvastatin (LIPITOR) 80 MG tablet Take 1 tablet (80 mg total) by mouth daily at 6 PM. Qty: 30 tablet, Refills: 2    carvedilol (COREG) 12.5 MG tablet Take 1 tablet (12.5 mg total) by mouth 2 (two) times daily with a meal. Qty: 60 tablet, Refills: 30    clopidogrel (PLAVIX) 75 MG tablet Take 1 tablet (75 mg total) by mouth daily. Qty: 30 tablet, Refills: 0    feeding supplement, GLUCERNA SHAKE, (GLUCERNA SHAKE) LIQD Take 237 mLs by mouth 3 (three) times daily between meals. Qty: 30 Can, Refills: 0    ferrous sulfate 325 (65 FE) MG tablet Take 1 tablet (325 mg total) by mouth 3 (three) times daily with meals. Refills: 2    gabapentin (NEURONTIN) 300 MG capsule Take 300 mg by mouth daily as needed.     hydrALAZINE (APRESOLINE) 25 MG tablet Take 1 tablet (25 mg total) by mouth every 8 (eight) hours. Qty: 30 tablet, Refills: 0    omeprazole (PRILOSEC) 20 MG capsule Take 20 mg by mouth daily.    ondansetron (ZOFRAN) 4 MG tablet Take 4 mg by mouth every 8 (eight) hours as needed for nausea or vomiting.    polyethylene glycol (MIRALAX / GLYCOLAX) packet Take 17 g by mouth daily.    sennosides-docusate sodium (SENOKOT-S) 8.6-50 MG tablet Take 1 tablet by mouth daily.    tamsulosin (FLOMAX) 0.4 MG CAPS capsule Take 1 capsule (0.4 mg total) by mouth daily. Qty: 30 capsule, Refills: 0      STOP taking these medications     ciprofloxacin (CIPRO) 500 MG tablet      collagenase (SANTYL) ointment      lisinopril  (PRINIVIL,ZESTRIL) 40 MG tablet      metFORMIN (GLUCOPHAGE) 500 MG tablet      traMADol (ULTRAM) 50 MG tablet      amoxicillin-clavulanate (AUGMENTIN) 875-125 MG tablet      aspirin EC 81 MG tablet      oxyCODONE-acetaminophen (PERCOCET/ROXICET) 5-325 MG tablet            Management plans discussed with the patient and he is in agreement. Stable for discharge SNF  Patient should follow up with nephrology and ID  CODE STATUS:     Code Status Orders        Start     Ordered   07/08/16 0125  Full code  Continuous     07/08/16 0124    Code Status History    Date Active Date Inactive Code Status Order ID Comments User Context   06/01/2016  3:41 PM 06/11/2016  9:14 PM Full Code [id number]  Bettey Costa, MD ED   05/03/2016 10:54 PM 05/11/2016  6:08 PM Full Code [id number]  Fritzi Mandes, MD Inpatient   03/27/2016  7:34 PM 03/29/2016  5:47 PM Full Code [id number]  Fritzi Mandes, MD Inpatient   02/14/2016  6:10 PM 02/15/2016  5:03 PM Full Code [id number]  Loletha Grayer, MD ED  01/13/2016  9:42 AM 01/17/2016 11:47 PM Full Code [id number]  Awilda Bill, NP ED    Advance Directive Documentation     Most Recent Value  Type of Advance Directive  Healthcare Power of Attorney  Pre-existing out of facility DNR order (yellow form or pink MOST form)  -  "MOST" Form in Place?  -      TOTAL TIME TAKING CARE OF THIS PATIENT: 38 minutes.    Note: This dictation was prepared with Dragon dictation along with smaller phrase technology. Any transcriptional errors that result from this process are unintentional.  Morine Kohlman M.D on 07/18/2016 at 10:53 AM  Between 7am to 6pm - Pager - [phone] After 6pm go to [email or web address] - password [email or web address]  Office  [phone]  CC: Primary care physician; Donnie Coffin, MD

## 2016-07-18 NOTE — Progress Notes (Signed)
[place] at [place] NAME: Damon [name]    MR#:  [id number]  DATE OF BIRTH:  [birth year]  SUBJECTIVE:   No acute events overnight. Has PICC line now REVIEW OF SYSTEMS:    Review of Systems  Constitutional: Negative for chills, fever and malaise/fatigue.  HENT: Negative.  Negative for ear discharge, ear pain, hearing loss, nosebleeds and sore throat.   Eyes: Negative.  Negative for blurred vision and pain.  Respiratory: Negative.  Negative for cough, hemoptysis, shortness of breath and wheezing.   Cardiovascular: Negative.  Negative for chest pain, palpitations and leg swelling.  Gastrointestinal: Negative.  Negative for abdominal pain, blood in stool, diarrhea, nausea and vomiting.  Genitourinary: Negative.  Negative for dysuria.  Musculoskeletal: Negative.  Negative for back pain.  Skin:       Gluteal absess  Neurological: Negative for dizziness, tremors, speech change, focal weakness, seizures, weakness and headaches.  Endo/Heme/Allergies: Negative.  Does not bruise/bleed easily.  Psychiatric/Behavioral: Negative.  Negative for depression, hallucinations and suicidal ideas.    Tolerating Diet: yes      DRUG ALLERGIES:  No Known Allergies  VITALS:  Blood pressure (!) 150/49, pulse 71, temperature 98.2 F (36.8 C), temperature source Oral, resp. rate 20, height '5\' 6"'  (1.676 m), weight 77.1 kg (169 lb 14.4 oz), SpO2 100 %.  PHYSICAL EXAMINATION:   Physical Exam  Constitutional: He is oriented to person, place, and time and well-developed, well-nourished, and in no distress. No distress.  HENT:  Head: Normocephalic.  Eyes: No scleral icterus.  Neck: Normal range of motion. Neck supple. No JVD present. No tracheal deviation present.  Cardiovascular: Normal rate, regular rhythm and normal heart sounds.  Exam reveals no gallop and no friction rub.   No murmur heard. Pulmonary/Chest: Effort normal and breath sounds normal. No  respiratory distress. He has no wheezes. He has no rales. He exhibits no tenderness.  Abdominal: Soft. Bowel sounds are normal. He exhibits no distension and no mass. There is no tenderness. There is no rebound and no guarding.  Musculoskeletal: Normal range of motion. He exhibits no edema.  Crusting and ulcers on the left foot. Right below-knee amputation Boot placed left foot  Neurological: He is alert and oriented to person, place, and time.  Skin: Skin is warm. No rash noted. No erythema.  Psychiatric: Affect and judgment normal.      LABORATORY PANEL:   CBC  Recent Labs Lab 07/17/16 0820  WBC 11.6*  HGB 9.5*  HCT 28.8*  PLT 363   ------------------------------------------------------------------------------------------------------------------  Chemistries   Recent Labs Lab 07/16/16 0511  07/18/16 0448  NA 134*  < > 137  K 4.6  < > 5.0  CL 108  < > 109  CO2 19*  < > 23  GLUCOSE 106*  < > 169*  BUN 84*  < > 64*  CREATININE 3.36*  < > 2.31*  CALCIUM 7.7*  < > 7.9*  AST 35  --   --   ALT 46  --   --   ALKPHOS 70  --   --   BILITOT 0.7  --   --   < > = values in this interval not displayed. ------------------------------------------------------------------------------------------------------------------  Cardiac Enzymes No results for input(s): TROPONINI in the last 168 hours. ------------------------------------------------------------------------------------------------------------------  RADIOLOGY:  Dg Chest Port 1 View  Result Date: 07/17/2016 CLINICAL DATA:  Status post PICC line placement on the right. History of COPD, CHF, coronary artery disease, current  smoker. EXAM: PORTABLE CHEST 1 VIEW COMPARISON:  Chest x-ray of July 08, 2016. FINDINGS: The lungs are adequately inflated. There is no postprocedure pneumothorax or hemothorax. The tip of the PICC line projects over the midportion of the SVC. There is no pleural effusion. The heart is top-normal in size  but stable. There is mild central pulmonary vascular prominence. There is calcification in the wall of the aortic arch. IMPRESSION: There is no postprocedure complication following PICC line placement on the right. No acute cardiopulmonary abnormality. Thoracic aortic atherosclerosis. Electronically Signed   By: David  Martinique M.D.   On: 07/17/2016 14:30     ASSESSMENT AND PLAN:   66 year old male with history of CAD and chronic kidney disease stage II presented with severe sacral decubitus ulcers and gluteal abscess.  1. Acute on chronic kidney disease stage II:in the setting of sepsis and decubitus ulcer Creatinine has improved He will follow up with Dr Holley Raring in 1 week. Hold nephrotoxic agents.    2. Sacral decubitus ulcer and complex glutealabscess status post incision and drainage on April 5 Continue Unasyn with planned stop date 08/23/2016. He has PICC right arm. He was also treated with Linezolid for VRE and completed course of therapy. Labs weekly while on IV antibiotics  CBC w diff Comprehensive met panel ESR  CRP He will need to follow-up in Dr. Ola Spurr in 2 weeks  3. Sepsis present on admission with Clostridium bacteremia due to sacral Decubitus ulcer and gluteal abscesses   4. Acute on chronic anemia: Has been transfused 2 units with stable Hgb.  5. Chronic systolic heart failure with ejection fraction 25-30%: No acute signs of exacerbation  6. CAD status post PCI in October: Continue atorvastatin and Coreg  7. Diabetes: Continue Lantus and ADA diet.   8. BPH: Continue tamsulosin  Management plans discussed with the patient and wife and he is in agreement.  CODE STATUS: full  TOTAL TIME TAKING CARE OF THIS PATIENT: 25 minutes.    POSSIBLE D/C  Today  CSW working on placement  Damon [name] M.D on 07/18/2016 at 3:33 PM  Between 7am to 6pm - Pager - [phone] After 6pm go to [email or web address] - password [email or web address]  [facility]  Office  [phone]  CC: Primary care physician; Donnie Coffin, MD  Note: This dictation was prepared with Dragon dictation along with smaller phrase technology. Any transcriptional errors that result from this process are unintentional.

## 2016-07-19 LAB — BASIC METABOLIC PANEL
Anion gap: 5 (ref 5–15)
BUN: 52 mg/dL — ABNORMAL HIGH (ref 6–20)
CO2: 23 mmol/L (ref 22–32)
CREATININE: 2.03 mg/dL — AB (ref 0.61–1.24)
Calcium: 8.1 mg/dL — ABNORMAL LOW (ref 8.9–10.3)
Chloride: 108 mmol/L (ref 101–111)
GFR, EST AFRICAN AMERICAN: 38 mL/min — AB (ref >60)
GFR, EST NON AFRICAN AMERICAN: 33 mL/min — AB (ref >60)
Glucose, Bld: 181 mg/dL — ABNORMAL HIGH (ref 65–99)
Potassium: 4.8 mmol/L (ref 3.5–5.1)
SODIUM: 136 mmol/L (ref 135–145)

## 2016-07-19 LAB — GLUCOSE, CAPILLARY
GLUCOSE-CAPILLARY: 185 mg/dL — AB (ref 65–99)
GLUCOSE-CAPILLARY: 119 mg/dL — AB (ref 65–99)
GLUCOSE-CAPILLARY: 218 mg/dL — AB (ref 65–99)
Glucose-Capillary: 222 mg/dL — ABNORMAL HIGH (ref 65–99)
Glucose-Capillary: 215 mg/dL — ABNORMAL HIGH (ref 65–99)

## 2016-07-19 NOTE — Discharge Summary (Signed)
[facility] [place] NAME: Damon [name]    MR#:  [id number]  DATE OF BIRTH:  1951-02-24  DATE OF ADMISSION:  07/07/2016 ADMITTING PHYSICIAN: Lance Coon, MD  DATE OF DISCHARGE: 07/19/2016  PRIMARY CARE PHYSICIAN: Donnie Coffin, MD    ADMISSION DIAGNOSIS:  Sepsis, due to unspecified organism ([place]) [A41.9]  DISCHARGE DIAGNOSIS:  Principal Problem:   Sepsis ([place]) Active Problems:   Coronary artery disease   Essential hypertension   HLD (hyperlipidemia)   Diabetes mellitus type 2 with complications (HCC)   Chronic systolic heart failure (HCC)   PAD (peripheral artery disease) (HCC)   Ulcer of left foot (HCC)   Abscess, gluteal, left   Necrotizing soft tissue infection ([place])   SECONDARY DIAGNOSIS:   Past Medical History:  Diagnosis Date  . CAD (coronary artery disease)    a. 01/2016 MI/PCI: LM nl, LAD 159md (3.25 x 28 Xience DES), 40d, LCX 30ost, OM1 95 (staged - 2.75 x 18 Xience Alpine DES), OM2/3 min irregs, RCA 839m . Chronic combined systolic and diastolic CHF (congestive heart failure) ([place]   a. 01/2016 Echo: Ef 25-30%, Gr1 DD, mild AI, mildly dil LA;  b. 02/2016 Echo: EF 30-35%, apical AK, antsept and ant HK, nl RV fxn.  . CKD (chronic kidney disease), stage III   . COPD (chronic obstructive pulmonary disease) ([place]  . Diabetes mellitus without complication ([facility]  . Diverticulitis   . Essential hypertension   . Ischemic cardiomyopathy    a. 01/2016 Echo: Ef 25-30%;  b. 02/2016 Echo: EF 30-35%.  . Marland KitchenBBB (left bundle branch block)   . PAD (peripheral artery disease) ([place]   a. 10/2015 s/p PTA and stenting of the RCIA, LCIA, and PTA of the LEIA ([place]); b. 04/2016 PTA or R tibioperoneal trunk & prox PT, PTA/DBA of R Pop and SFA, Viabahn covered stent x 2 to the R SFA and pop (46m52m 25 cm & 46mm47m15cm).  . Tobacco abuse     HOSPITAL COURSE:   66 y5r old male with history of CAD and chronic kidney disease stage II  presented with severe sacral decubitus ulcers and gluteal abscess.  1. Acute on chronic kidney disease stage II:in the setting of sepsis and decubitus ulcer Creatinine has improved He will follow up with Dr LateHolley Raring1 week. Hold nephrotoxic agents.    2. Sacral decubitus ulcer and complex gluteal abscess status post incision and drainage on April 5 Continue Unasyn with planned stop date 08/23/2016. He has PICC right arm. He was also treated with Linezolid for VRE and completed course of therapy. Labs weekly while on IV antibiotics      CBC w diff         Comprehensive met panel ESR  CRP He will need to follow-up in Dr. FitzOla Spurr2 weeks  3. Sepsis present on admission with Clostridium bacteremia due to sacral Decubitus ulcer and gluteal abscesses    4. Acute on chronic anemia: Has been transfused 2 units with stable Hgb.  5. Chronic systolic heart failure with ejection fraction 25-30%: No acute signs of exacerbation  6. CAD status post PCI in October: Continue atorvastatin and Coreg  7. Diabetes: Continue Lantus and ADA diet.   8. BPH: Continue tamsulosin   DISCHARGE CONDITIONS AND DIET:   Stable Diabetic diet  CONSULTS OBTAINED:  Treatment Team:  JustSamara DeistM SnehEpifanio Lesches DaviLeonel Ramsay CharClayburn Pert Munsoor  Holley Raring, MD  DRUG ALLERGIES:  No Known Allergies  DISCHARGE MEDICATIONS:   Current Discharge Medication List    START taking these medications   Details  Ampicillin-Sulbactam 3 g in sodium chloride 0.9 % 100 mL Inject 3 g into the vein every 12 (twelve) hours. Qty: 300 g, Refills: 2    HYDROcodone-acetaminophen (NORCO/VICODIN) 5-325 MG tablet Take 1 tablet by mouth every 6 (six) hours as needed for moderate pain. Qty: 30 tablet, Refills: 0      CONTINUE these medications which have CHANGED   Details  insulin glargine (LANTUS) 100 UNIT/ML injection Inject 0.14 mLs (14 Units total) into the skin at  bedtime. Qty: 10 mL, Refills: 11      CONTINUE these medications which have NOT CHANGED   Details  acetaminophen (TYLENOL) 500 MG tablet Take 500 mg by mouth every 6 (six) hours as needed.    albuterol-ipratropium (COMBIVENT) 18-103 MCG/ACT inhaler Inhale into the lungs every 4 (four) hours as needed for wheezing or shortness of breath.    atorvastatin (LIPITOR) 80 MG tablet Take 1 tablet (80 mg total) by mouth daily at 6 PM. Qty: 30 tablet, Refills: 2    carvedilol (COREG) 12.5 MG tablet Take 1 tablet (12.5 mg total) by mouth 2 (two) times daily with a meal. Qty: 60 tablet, Refills: 30    clopidogrel (PLAVIX) 75 MG tablet Take 1 tablet (75 mg total) by mouth daily. Qty: 30 tablet, Refills: 0    feeding supplement, GLUCERNA SHAKE, (GLUCERNA SHAKE) LIQD Take 237 mLs by mouth 3 (three) times daily between meals. Qty: 30 Can, Refills: 0    ferrous sulfate 325 (65 FE) MG tablet Take 1 tablet (325 mg total) by mouth 3 (three) times daily with meals. Refills: 2    gabapentin (NEURONTIN) 300 MG capsule Take 300 mg by mouth daily as needed.     hydrALAZINE (APRESOLINE) 25 MG tablet Take 1 tablet (25 mg total) by mouth every 8 (eight) hours. Qty: 30 tablet, Refills: 0    omeprazole (PRILOSEC) 20 MG capsule Take 20 mg by mouth daily.    ondansetron (ZOFRAN) 4 MG tablet Take 4 mg by mouth every 8 (eight) hours as needed for nausea or vomiting.    polyethylene glycol (MIRALAX / GLYCOLAX) packet Take 17 g by mouth daily.    sennosides-docusate sodium (SENOKOT-S) 8.6-50 MG tablet Take 1 tablet by mouth daily.    tamsulosin (FLOMAX) 0.4 MG CAPS capsule Take 1 capsule (0.4 mg total) by mouth daily. Qty: 30 capsule, Refills: 0      STOP taking these medications     ciprofloxacin (CIPRO) 500 MG tablet      collagenase (SANTYL) ointment      lisinopril (PRINIVIL,ZESTRIL) 40 MG tablet      metFORMIN (GLUCOPHAGE) 500 MG tablet      traMADol (ULTRAM) 50 MG tablet       amoxicillin-clavulanate (AUGMENTIN) 875-125 MG tablet      aspirin EC 81 MG tablet      oxyCODONE-acetaminophen (PERCOCET/ROXICET) 5-325 MG tablet           Today   CHIEF COMPLAINT:  Doing well no issues overnight   VITAL SIGNS:  Blood pressure (!) 134/55, pulse 80, temperature 98.1 F (36.7 C), temperature source Oral, resp. rate 16, height 5' 6" (1.676 m), weight 77 kg (169 lb 11.2 oz), SpO2 99 %.   REVIEW OF SYSTEMS:  Review of Systems  Constitutional: Negative.  Negative for chills, fever and malaise/fatigue.  HENT:  Negative.  Negative for ear discharge, ear pain, hearing loss, nosebleeds and sore throat.   Eyes: Negative.  Negative for blurred vision and pain.  Respiratory: Negative.  Negative for cough, hemoptysis, shortness of breath and wheezing.   Cardiovascular: Negative.  Negative for chest pain, palpitations and leg swelling.  Gastrointestinal: Negative.  Negative for abdominal pain, blood in stool, diarrhea, nausea and vomiting.  Genitourinary: Negative.  Negative for dysuria.  Musculoskeletal: Negative.  Negative for back pain.       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  Endo/Heme/Allergies: Negative.  Does not bruise/bleed easily.  Psychiatric/Behavioral: Negative.  Negative for depression, hallucinations and suicidal ideas.     PHYSICAL EXAMINATION:  GENERAL:  66 y.o.-year-old patient lying in the bed with no acute distress.  NECK:  Supple, no jugular venous distention. No thyroid enlargement, no tenderness.  LUNGS: Normal breath sounds bilaterally, no wheezing, rales,rhonchi  No use of accessory muscles of respiration.  CARDIOVASCULAR: S1, S2 normal. No murmurs, rubs, or gallops.  ABDOMEN: Soft, non-tender, non-distended. Bowel sounds present. No organomegaly or mass.  EXTREMITIES: No pedal edema, cyanosis, or clubbing.  Right BKA PSYCHIATRIC: The patient is alert and oriented x 3.  SKIN: sacral ulcer/decub  DATA REVIEW:   CBC  Recent Labs Lab 07/17/16 0820  WBC 11.6*  HGB 9.5*  HCT 28.8*  PLT 363    Chemistries   Recent Labs Lab 07/16/16 0511  07/19/16 1000  NA 134*  < > 136  K 4.6  < > 4.8  CL 108  < > 108  CO2 19*  < > 23  GLUCOSE 106*  < > 181*  BUN 84*  < > 52*  CREATININE 3.36*  < > 2.03*  CALCIUM 7.7*  < > 8.1*  AST 35  --   --   ALT 46  --   --   ALKPHOS 70  --   --   BILITOT 0.7  --   --   < > = values in this interval not displayed.  Cardiac Enzymes No results for input(s): TROPONINI in the last 168 hours.  Microbiology Results  _0 @  RADIOLOGY:  Dg Chest Port 1 View  Result Date: 07/17/2016 CLINICAL DATA:  Status post PICC line placement on the right. History of COPD, CHF, coronary artery disease, current smoker. EXAM: PORTABLE CHEST 1 VIEW COMPARISON:  Chest x-ray of July 08, 2016. FINDINGS: The lungs are adequately inflated. There is no postprocedure pneumothorax or hemothorax. The tip of the PICC line projects over the midportion of the SVC. There is no pleural effusion. The heart is top-normal in size but stable. There is mild central pulmonary vascular prominence. There is calcification in the wall of the aortic arch. IMPRESSION: There is no postprocedure complication following PICC line placement on the right. No acute cardiopulmonary abnormality. Thoracic aortic atherosclerosis. Electronically Signed   By: David  Martinique M.D.   On: 07/17/2016 14:30      Current Discharge Medication List    START taking these medications   Details  Ampicillin-Sulbactam 3 g in sodium chloride 0.9 % 100 mL Inject 3 g into the vein every 12 (twelve) hours. Qty: 300 g, Refills: 2    HYDROcodone-acetaminophen (NORCO/VICODIN) 5-325 MG tablet Take 1 tablet by mouth every 6 (six) hours as needed for moderate pain. Qty: 30 tablet, Refills: 0  CONTINUE these medications  which have CHANGED   Details  insulin glargine (LANTUS) 100 UNIT/ML injection Inject 0.14 mLs (14 Units total) into the skin at bedtime. Qty: 10 mL, Refills: 11      CONTINUE these medications which have NOT CHANGED   Details  acetaminophen (TYLENOL) 500 MG tablet Take 500 mg by mouth every 6 (six) hours as needed.    albuterol-ipratropium (COMBIVENT) 18-103 MCG/ACT inhaler Inhale into the lungs every 4 (four) hours as needed for wheezing or shortness of breath.    atorvastatin (LIPITOR) 80 MG tablet Take 1 tablet (80 mg total) by mouth daily at 6 PM. Qty: 30 tablet, Refills: 2    carvedilol (COREG) 12.5 MG tablet Take 1 tablet (12.5 mg total) by mouth 2 (two) times daily with a meal. Qty: 60 tablet, Refills: 30    clopidogrel (PLAVIX) 75 MG tablet Take 1 tablet (75 mg total) by mouth daily. Qty: 30 tablet, Refills: 0    feeding supplement, GLUCERNA SHAKE, (GLUCERNA SHAKE) LIQD Take 237 mLs by mouth 3 (three) times daily between meals. Qty: 30 Can, Refills: 0    ferrous sulfate 325 (65 FE) MG tablet Take 1 tablet (325 mg total) by mouth 3 (three) times daily with meals. Refills: 2    gabapentin (NEURONTIN) 300 MG capsule Take 300 mg by mouth daily as needed.     hydrALAZINE (APRESOLINE) 25 MG tablet Take 1 tablet (25 mg total) by mouth every 8 (eight) hours. Qty: 30 tablet, Refills: 0    omeprazole (PRILOSEC) 20 MG capsule Take 20 mg by mouth daily.    ondansetron (ZOFRAN) 4 MG tablet Take 4 mg by mouth every 8 (eight) hours as needed for nausea or vomiting.    polyethylene glycol (MIRALAX / GLYCOLAX) packet Take 17 g by mouth daily.    sennosides-docusate sodium (SENOKOT-S) 8.6-50 MG tablet Take 1 tablet by mouth daily.    tamsulosin (FLOMAX) 0.4 MG CAPS capsule Take 1 capsule (0.4 mg total) by mouth daily. Qty: 30 capsule, Refills: 0      STOP taking these medications     ciprofloxacin (CIPRO) 500 MG tablet      collagenase (SANTYL) ointment      lisinopril  (PRINIVIL,ZESTRIL) 40 MG tablet      metFORMIN (GLUCOPHAGE) 500 MG tablet      traMADol (ULTRAM) 50 MG tablet      amoxicillin-clavulanate (AUGMENTIN) 875-125 MG tablet      aspirin EC 81 MG tablet      oxyCODONE-acetaminophen (PERCOCET/ROXICET) 5-325 MG tablet          Management plans discussed with the patient and he is in agreement. Stable for discharge SNF  Patient should follow up with nephrology and ID  CODE STATUS:     Code Status Orders        Start     Ordered   07/08/16 0125  Full code  Continuous     07/08/16 0124    Code Status History    Date Active Date Inactive Code Status Order ID Comments User Context   06/01/2016  3:41 PM 06/11/2016  9:14 PM Full Code [id number]  Bettey Costa, MD ED   05/03/2016 10:54 PM 05/11/2016  6:08 PM Full Code [id number]  Fritzi Mandes, MD Inpatient   03/27/2016  7:34 PM 03/29/2016  5:47 PM Full Code [id number]  Fritzi Mandes, MD Inpatient   02/14/2016  6:10 PM 02/15/2016  5:03 PM Full Code [id number]  Loletha Grayer, MD ED   01/13/2016  9:42 AM 01/17/2016 11:47 PM Full Code [id number]  Awilda Bill, NP ED    Advance Directive Documentation     Most Recent Value  Type of Advance Directive  Healthcare Power of Attorney  Pre-existing out of facility DNR order (yellow form or pink MOST form)  -  "MOST" Form in Place?  -      TOTAL TIME TAKING CARE OF THIS PATIENT: 35 minutes.    Note: This dictation was prepared with Dragon dictation along with smaller phrase technology. Any transcriptional errors that result from this process are unintentional.  Vaughan Basta M.D on 07/19/2016 at 1:48 PM  Between 7am to 6pm - Pager - [phone] After 6pm go to [email or web address] - password [email or web address]  Office  [phone]  CC: Primary care physician; Donnie Coffin, MD

## 2016-07-19 NOTE — Progress Notes (Signed)
[facility] [place] NAME: Damon [name]    MR#:  [id number]  DATE OF BIRTH:  07/06/1950  SUBJECTIVE:   No acute events overnight. Has PICC line now, waiting for placement.  REVIEW OF SYSTEMS:    Review of Systems  Constitutional: Negative for chills, fever and malaise/fatigue.  HENT: Negative.  Negative for ear discharge, ear pain, hearing loss, nosebleeds and sore throat.   Eyes: Negative.  Negative for blurred vision and pain.  Respiratory: Negative.  Negative for cough, hemoptysis, shortness of breath and wheezing.   Cardiovascular: Negative.  Negative for chest pain, palpitations and leg swelling.  Gastrointestinal: Negative.  Negative for abdominal pain, blood in stool, diarrhea, nausea and vomiting.  Genitourinary: Negative.  Negative for dysuria.  Musculoskeletal: Negative.  Negative for back pain.  Skin:       Gluteal absess  Neurological: Negative for dizziness, tremors, speech change, focal weakness, seizures, weakness and headaches.  Endo/Heme/Allergies: Negative.  Does not bruise/bleed easily.  Psychiatric/Behavioral: Negative.  Negative for depression, hallucinations and suicidal ideas.    Tolerating Diet: yes   DRUG ALLERGIES:  No Known Allergies  VITALS:  Blood pressure (!) 134/55, pulse 80, temperature 98.1 F (36.7 C), temperature source Oral, resp. rate 16, height '5\' 6"'  (1.676 m), weight 77 kg (169 lb 11.2 oz), SpO2 99 %.  PHYSICAL EXAMINATION:   Physical Exam  Constitutional: He is oriented to person, place, and time and well-developed, well-nourished, and in no distress. No distress.  HENT:  Head: Normocephalic.  Eyes: No scleral icterus.  Neck: Normal range of motion. Neck supple. No JVD present. No tracheal deviation present.  Cardiovascular: Normal rate, regular rhythm and normal heart sounds.  Exam reveals no gallop and no friction rub.   No murmur heard. Pulmonary/Chest: Effort normal and breath sounds  normal. No respiratory distress. He has no wheezes. He has no rales. He exhibits no tenderness.  Abdominal: Soft. Bowel sounds are normal. He exhibits no distension and no mass. There is no tenderness. There is no rebound and no guarding.  Musculoskeletal: Normal range of motion. He exhibits no edema.  Crusting and ulcers on the left foot. Right below-knee amputation Boot placed left foot  Neurological: He is alert and oriented to person, place, and time.  Skin: Skin is warm. No rash noted. No erythema.  Psychiatric: Affect and judgment normal.    LABORATORY PANEL:   CBC  Recent Labs Lab 07/17/16 0820  WBC 11.6*  HGB 9.5*  HCT 28.8*  PLT 363   ------------------------------------------------------------------------------------------------------------------  Chemistries   Recent Labs Lab 07/16/16 0511  07/19/16 1000  NA 134*  < > 136  K 4.6  < > 4.8  CL 108  < > 108  CO2 19*  < > 23  GLUCOSE 106*  < > 181*  BUN 84*  < > 52*  CREATININE 3.36*  < > 2.03*  CALCIUM 7.7*  < > 8.1*  AST 35  --   --   ALT 46  --   --   ALKPHOS 70  --   --   BILITOT 0.7  --   --   < > = values in this interval not displayed. ------------------------------------------------------------------------------------------------------------------  Cardiac Enzymes No results for input(s): TROPONINI in the last 168 hours. ------------------------------------------------------------------------------------------------------------------  RADIOLOGY:  Dg Chest Port 1 View  Result Date: 07/17/2016 CLINICAL DATA:  Status post PICC line placement on the right. History of COPD, CHF, coronary artery disease, current smoker.  EXAM: PORTABLE CHEST 1 VIEW COMPARISON:  Chest x-ray of July 08, 2016. FINDINGS: The lungs are adequately inflated. There is no postprocedure pneumothorax or hemothorax. The tip of the PICC line projects over the midportion of the SVC. There is no pleural effusion. The heart is top-normal  in size but stable. There is mild central pulmonary vascular prominence. There is calcification in the wall of the aortic arch. IMPRESSION: There is no postprocedure complication following PICC line placement on the right. No acute cardiopulmonary abnormality. Thoracic aortic atherosclerosis. Electronically Signed   By: David  Martinique M.D.   On: 07/17/2016 14:30     ASSESSMENT AND PLAN:   66 year old male with history of CAD and chronic kidney disease stage II presented with severe sacral decubitus ulcers and gluteal abscess.  1. Acute on chronic kidney disease stage II:in the setting of sepsis and decubitus ulcer Creatinine has improved gradually. He will follow up with Dr Holley Raring in 1 week. Hold nephrotoxic agents.   2. Sacral decubitus ulcer and complex glutealabscess status post incision and drainage on April 5 Continue Unasyn with planned stop date 08/23/2016. He has PICC right arm. He was also treated with Linezolid for VRE and completed course of therapy. Labs weekly while on IV antibiotics  CBC w diff Comprehensive met panel ESR  CRP He will need to follow-up in Dr. Ola Spurr in 2 weeks  3. Sepsis present on admission with Clostridium bacteremia due to sacral Decubitus ulcer and gluteal abscesses   4. Acute on chronic anemia: Has been transfused 2 units with stable Hgb.  5. Chronic systolic heart failure with ejection fraction 25-30%: No acute signs of exacerbation  6. CAD status post PCI in October: Continue atorvastatin and Coreg  7. Diabetes: Continue Lantus and ADA diet.  8. BPH: Continue tamsulosin  Management plans discussed with the patient and wife and he is in agreement.  CODE STATUS: full  TOTAL TIME TAKING CARE OF THIS PATIENT: 25 minutes.  Awaited NH placement.  POSSIBLE D/C  Today/ tomorrow.  CSW working on placement  Vaughan Basta M.D on 07/19/2016 at 1:44 PM  Between 7am to 6pm - Pager - [phone] After 6pm go to  [email or web address] - password [email or web address] [facility]  Office  [phone]  CC: Primary care physician; Donnie Coffin, MD  Note: This dictation was prepared with Dragon dictation along with smaller phrase technology. Any transcriptional errors that result from this process are unintentional.

## 2016-07-19 NOTE — Progress Notes (Signed)
PT Cancellation Note  Patient Details Name: Damon [name] MRN: [id number] DOB: [birth year]   Cancelled Treatment:    Reason Eval/Treat Not Completed: Patient declined, no reason specified;Other (comment). Treatment attempted; pt refuses at this time, but wishes therapist to come back this afternoon. Explained to pt that therapist will re attempt as the schedule allows. Pt agreeable.     Scot Dock, PTA 07/19/2016, 11:53 AM

## 2016-07-19 NOTE — Progress Notes (Signed)
[facility] can accept patient tomorrow under an LOG. Clinical Social Worker (CSW) contacted patient's wife Zalmen Wrightsman and made her aware of above. Wife accepted bed offer. Per wife she is going to apply for [employer]. CSW advised wife to also apply for long term care medicaid for patient because he has been in an out of SNF's for a while now. CSW gave wife information on how to apply for medicaid at [facility] office in [place]. Social work Theatre manager met with patient and made him aware of above. Patient and wife are agreeable for patient to go to [facility] tomorrow. CSW will continue to follow and assist as needed.   [employer], LCSW [phone]

## 2016-07-19 NOTE — Progress Notes (Signed)
[facility]  ROUNDING NOTE   Subjective:   Wife at bedside.   Objective:  Vital signs in last 24 hours:  Temp:  [97.6 F (36.4 C)-98.8 F (37.1 C)] 98.1 F (36.7 C) (04/12 0855) Pulse Rate:  [68-80] 80 (04/12 1152) Resp:  [16-18] 16 (04/12 1152) BP: (134-157)/(54-59) 134/55 (04/12 1152) SpO2:  [97 %-100 %] 99 % (04/12 1152) Weight:  [77 kg (169 lb 11.2 oz)] 77 kg (169 lb 11.2 oz) (04/12 0438)  Weight change: -0.091 kg (-3.2 oz) Filed Weights   07/17/16 0633 07/18/16 0411 07/19/16 0438  Weight: 74.5 kg (164 lb 4.8 oz) 77.1 kg (169 lb 14.4 oz) 77 kg (169 lb 11.2 oz)    Intake/Output: I/O last 3 completed shifts: In: 794.2 [I.V.:794.2] Out: 2200 [Urine:2200]   Intake/Output this shift:  Total I/O In: 400 [IV Piggyback:400] Out: 100 [Urine:100]  Physical Exam: General: No acute distress  Head: Normocephalic, atraumatic. 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  Neurologic: Nonfocal, moving all four extremities  Skin: No lesions       Basic Metabolic Panel:  Recent Labs Lab 07/15/16 0714 07/16/16 0511 07/17/16 0820 07/18/16 0448 07/19/16 1000  NA 133* 134* 136 137 136  K 4.3 4.6 4.9 5.0 4.8  CL 107 108 111 109 108  CO2 18* 19* 18* 23 23  GLUCOSE 231* 106* 81 169* 181*  BUN 87* 84* 70* 64* 52*  CREATININE 3.76* 3.36* 2.65* 2.31* 2.03*  CALCIUM 7.4* 7.7* 8.1* 7.9* 8.1*    Liver Function Tests:  Recent Labs Lab 07/16/16 0511  AST 35  ALT 46  ALKPHOS 70  BILITOT 0.7  PROT 5.8*  ALBUMIN 1.6*   No results for input(s): LIPASE, AMYLASE in the last 168 hours. No results for input(s): AMMONIA in the last 168 hours.  CBC:  Recent Labs Lab 07/13/16 0400 07/14/16 0458 07/15/16 0714 07/16/16 0511 07/16/16 1729 07/17/16 0820  WBC 15.1* 16.8* 13.1* 11.2*  --   11.6*  NEUTROABS 13.7* 15.2*  --  9.5*  --   --   HGB 7.7* 7.6* 7.0* 7.1* 9.4* 9.5*  HCT 23.3* 23.0* 21.5* 21.7* 27.9* 28.8*  MCV 90.3 90.8 90.0 91.5  --  89.2  PLT 261 276 288 332  --  363    Cardiac Enzymes:  Recent Labs Lab 07/14/16 0458  CKTOTAL 63    BNP: Invalid input(s): POCBNP  CBG:  Recent Labs Lab 07/18/16 1207 07/18/16 1647 07/18/16 2041 07/19/16 0857 07/19/16 1151  GLUCAP 242* 224* 150* 119* 222*    Microbiology: Results for orders placed or performed during the hospital encounter of 07/07/16  Blood Culture (routine x 2)     Status: Abnormal   Collection Time: 07/07/16 10:07 PM  Result Value Ref Range Status   Specimen Description BLOOD LEFT FOREARM  Final   Special Requests   Final    BOTTLES DRAWN AEROBIC AND ANAEROBIC Blood Culture adequate volume   Culture  Setup Time   Final    GRAM POSITIVE RODS ANAEROBIC BOTTLE ONLY CRITICAL VALUE NOTED.  VALUE IS CONSISTENT WITH PREVIOUSLY REPORTED AND CALLED VALUE.    Culture (A)  Final    CLOSTRIDIUM RAMOSUM Standardized susceptibility testing for this organism is not available.    Report Status 07/11/2016 FINAL  Final  Blood Culture (routine x  2)     Status: Abnormal   Collection Time: 07/07/16 10:07 PM  Result Value Ref Range Status   Specimen Description BLOOD RIGHT HAND  Final   Special Requests   Final    BOTTLES DRAWN AEROBIC AND ANAEROBIC Blood Culture adequate volume   Culture  Setup Time   Final    Organism ID to follow GRAM POSITIVE RODS ANAEROBIC BOTTLE ONLY CRITICAL RESULT CALLED TO, READ BACK BY AND VERIFIED WITH: NATE COOKSON AT 2046 07/08/16.SDR/PMH    Culture (A)  Final    CLOSTRIDIUM RAMOSUM Standardized susceptibility testing for this organism is not available. Performed at [facility], [address], [place], [place] [address]    Report Status 07/11/2016 FINAL  Final  Blood Culture ID Panel (Reflexed)     Status: None   Collection Time: 07/07/16 10:07 PM  Result Value  Ref Range Status   Enterococcus species NOT DETECTED NOT DETECTED Final   Listeria monocytogenes NOT DETECTED NOT DETECTED Final   Staphylococcus species NOT DETECTED NOT DETECTED Final   Staphylococcus aureus NOT DETECTED NOT DETECTED Final   Streptococcus species NOT DETECTED NOT DETECTED Final   Streptococcus agalactiae NOT DETECTED NOT DETECTED Final   Streptococcus pneumoniae NOT DETECTED NOT DETECTED Final   Streptococcus pyogenes NOT DETECTED NOT DETECTED Final   Acinetobacter baumannii NOT DETECTED NOT DETECTED Final   Enterobacteriaceae species NOT DETECTED NOT DETECTED Final   Enterobacter cloacae complex NOT DETECTED NOT DETECTED Final   Escherichia coli NOT DETECTED NOT DETECTED Final   Klebsiella oxytoca NOT DETECTED NOT DETECTED Final   Klebsiella pneumoniae NOT DETECTED NOT DETECTED Final   Proteus species NOT DETECTED NOT DETECTED Final   Serratia marcescens NOT DETECTED NOT DETECTED Final   Haemophilus influenzae NOT DETECTED NOT DETECTED Final   Neisseria meningitidis NOT DETECTED NOT DETECTED Final   Pseudomonas aeruginosa NOT DETECTED NOT DETECTED Final   Candida albicans NOT DETECTED NOT DETECTED Final   Candida glabrata NOT DETECTED NOT DETECTED Final   Candida krusei NOT DETECTED NOT DETECTED Final   Candida parapsilosis NOT DETECTED NOT DETECTED Final   Candida tropicalis NOT DETECTED NOT DETECTED Final  MRSA PCR Screening     Status: None   Collection Time: 07/11/16 11:14 PM  Result Value Ref Range Status   MRSA by PCR NEGATIVE NEGATIVE Final    Comment:        The GeneXpert MRSA Assay (FDA approved for NASAL specimens only), is one component of a comprehensive MRSA colonization surveillance program. It is not intended to diagnose MRSA infection nor to guide or monitor treatment for MRSA infections.   Aerobic/Anaerobic Culture (surgical/deep wound)     Status: None   Collection Time: 07/12/16 10:38 PM  Result Value Ref Range Status   Specimen  Description [facility]  Final   Special Requests NONE  Final   Gram Stain   Final    RARE WBC PRESENT, PREDOMINANTLY PMN RARE GRAM POSITIVE COCCI IN PAIRS Performed at [facility], [address] [place], [place] [address]    Culture   Final    FEW ENTEROCOCCUS FAECIUM SUSCEPTIBILITIES PERFORMED ON PREVIOUS CULTURE WITHIN THE LAST 5 DAYS. RARE CLOSTRIDIUM RAMOSUM RARE PEPTOSTREPTOCOCCUS SPECIES    Report Status 07/17/2016 FINAL  Final  Aerobic/Anaerobic Culture (surgical/deep wound)     Status: None   Collection Time: 07/12/16 10:39 PM  Result Value Ref Range Status   Specimen Description [facility]  Final   Special Requests NONE  Final  Gram Stain   Final    RARE WBC PRESENT, PREDOMINANTLY PMN RARE GRAM POSITIVE COCCI IN PAIRS RARE GRAM POSITIVE RODS Performed at [facility], [address] [place], [place] [address]    Culture   Final    FEW VANCOMYCIN RESISTANT ENTEROCOCCUS FEW CLOSTRIDIUM RAMOSUM    Report Status 07/17/2016 FINAL  Final   Organism ID, Bacteria VANCOMYCIN RESISTANT ENTEROCOCCUS  Final      Susceptibility   Vancomycin resistant enterococcus - MIC*    AMPICILLIN >=32 RESISTANT Resistant     VANCOMYCIN >=32 RESISTANT Resistant     GENTAMICIN SYNERGY SENSITIVE Sensitive     LINEZOLID 2 SENSITIVE Sensitive     * FEW VANCOMYCIN RESISTANT ENTEROCOCCUS    Coagulation Studies: No results for input(s): LABPROT, INR in the last 72 hours.  Urinalysis: No results for input(s): COLORURINE, LABSPEC, PHURINE, GLUCOSEU, HGBUR, BILIRUBINUR, KETONESUR, PROTEINUR, UROBILINOGEN, NITRITE, LEUKOCYTESUR in the last 72 hours.  Invalid input(s): APPERANCEUR    Imaging: Dg Chest Port 1 View  Result Date: 07/17/2016 CLINICAL DATA:  Status post PICC line placement on the right. History of COPD, CHF, coronary artery disease, current smoker. EXAM: PORTABLE CHEST 1 VIEW COMPARISON:  Chest x-ray of July 08, 2016. FINDINGS: The lungs are adequately inflated. There  is no postprocedure pneumothorax or hemothorax. The tip of the PICC line projects over the midportion of the SVC. There is no pleural effusion. The heart is top-normal in size but stable. There is mild central pulmonary vascular prominence. There is calcification in the wall of the aortic arch. IMPRESSION: There is no postprocedure complication following PICC line placement on the right. No acute cardiopulmonary abnormality. Thoracic aortic atherosclerosis. Electronically Signed   By: David  [place]az[place]and M.D.   On: 07/17/2016 14:30     Medications:    . ampicillin-sulbactam (UNASYN) IV  3 g Intravenous Q12H  . atorvastatin  80 mg Oral q1800  . carvedilol  6.25 mg Oral BID WC  . feeding supplement (ENSURE ENLIVE)  237 mL Oral TID BM  . heparin subcutaneous  5,000 Units Subcutaneous Q8H  . insulin aspart  0-15 Units Subcutaneous TID WC  . insulin aspart  0-5 Units Subcutaneous QHS  . insulin glargine  14 Units Subcutaneous QHS  . iopamidol  30 mL Oral Once  . linezolid (ZYVOX) IV  600 mg Intravenous Q12H  . pantoprazole  40 mg Oral Daily  . tamsulosin  0.4 mg Oral Daily   acetaminophen **OR** acetaminophen, albuterol, HYDROcodone-acetaminophen, morphine injection, ondansetron **OR** ondansetron (ZOFRAN) IV, sodium chloride flush  Assessment/ Plan:  66 y.o.black male with cardiomyopathy with ejection pressure 30-35%, coronary artery disease status post stent placement, hypertension, peripheral arterial disease status post right BKA, lower extremity edema, diabetes mellitus type 2, admitted with severe sacral decubitis ulcer now with ARF.   1. 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 Baseline Cr 1.2 04/2016. Follows with Dr. Cherylann Ratel as outpatient.   - encourage PO intake.   2. Hyponatremia: improving      3. Anemia of chronic kidney disease: Hemoglobin 9.5. PRBC transfusion 4/9  4. Sacral decubitus ulcer and abscess: status post incision and  drainage on 4/5 Dr. Everlene Farrier - Appreciate ID input.  - Unasyn     LOS: 12 Lilyth Lawyer 4/12/20181:58 PM

## 2016-07-19 NOTE — Progress Notes (Addendum)
Inpatient Diabetes Program Recommendations  AACE/ADA: New Consensus Statement on Inpatient Glycemic Control (2015)  Target Ranges:  Prepandial:   less than 140 mg/dL      Peak postprandial:   less than 180 mg/dL (1-2 hours)      Critically ill patients:  140 - 180 mg/dL   Lab Results  Component Value Date   GLUCAP 222 (H) 07/19/2016   HGBA1C 7.8 (H) 07/08/2016    Review of Glycemic Control  Results for ROBET, CRUTCHFIELD (MRN [id number]) as of 07/19/2016 12:10  Ref. Range 07/18/2016 12:07 07/18/2016 16:47 07/18/2016 20:41 07/19/2016 08:57 07/19/2016 11:51  Glucose-Capillary Latest Ref Range: 65 - 99 mg/dL 130 (H) 865 (H) 784 (H) 119 (H) 222 (H)    Diabetes history: Type 2 diabetes Outpatient Diabetes medications: Lantus 14 units q HS, Metformin 500 mg bid  Current orders for Inpatient glycemic control: Novolog moderate correction scale 0-15 units tid, Novolog 0-5 units qhs, Lantus 104units q HS  Inpatient Diabetes Program Recommendations:    Please consider adding  Novolog meal coverage 3 units tid with meals (hold if patient eats less than 50%)- this will guarantee he will get a little insulin at each meal  Susette Racer, RN, BA, [place], CDE Diabetes Coordinator Inpatient Diabetes Program  [phone] (Team Pager) [phone] [facility] Office) 07/19/2016 12:11 PM

## 2016-07-20 LAB — GLUCOSE, CAPILLARY
GLUCOSE-CAPILLARY: 57 mg/dL — AB (ref 65–99)
GLUCOSE-CAPILLARY: 85 mg/dL (ref 65–99)
GLUCOSE-CAPILLARY: 191 mg/dL — AB (ref 65–99)

## 2016-07-20 MED ORDER — INSULIN GLARGINE 100 UNIT/ML ~~LOC~~ SOLN
10.0000 [IU] | Freq: Every day | SUBCUTANEOUS | 11 refills | Status: DC
Start: 2016-07-20 — End: 2017-01-22

## 2016-07-20 MED ORDER — INSULIN GLARGINE 100 UNIT/ML ~~LOC~~ SOLN
10.0000 [IU] | Freq: Every day | SUBCUTANEOUS | Status: DC
  Filled 2016-07-20: qty 0.1

## 2016-07-20 NOTE — Progress Notes (Signed)
[facility]  ROUNDING NOTE   Subjective:   Creatinine 2.03 (2.31)  Objective:  Vital signs in last 24 hours:  Temp:  [97.9 F (36.6 C)-99.1 F (37.3 C)] 97.9 F (36.6 C) (04/13 0948) Pulse Rate:  [67-80] 67 (04/13 0948) Resp:  [16-18] 18 (04/13 0948) BP: (134-154)/(50-64) 138/50 (04/13 0948) SpO2:  [95 %-99 %] 99 % (04/13 0948) Weight:  [78.2 kg (172 lb 8 oz)] 78.2 kg (172 lb 8 oz) (04/13 0414)  Weight change: 1.27 kg (2 lb 12.8 oz) Filed Weights   07/18/16 0411 07/19/16 0438 07/20/16 0414  Weight: 77.1 kg (169 lb 14.4 oz) 77 kg (169 lb 11.2 oz) 78.2 kg (172 lb 8 oz)    Intake/Output: I/O last 3 completed shifts: In: 880 [P.O.:480; IV Piggyback:400] Out: 1150 [Urine:1150]   Intake/Output this shift:  No intake/output data recorded.  Physical Exam: General: No acute distress  Head: Normocephalic, atraumatic. 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  Neurologic: Nonfocal, moving all four extremities  Skin: No lesions       Basic Metabolic Panel:  Recent Labs Lab 07/15/16 0714 07/16/16 0511 07/17/16 0820 07/18/16 0448 07/19/16 1000  NA 133* 134* 136 137 136  K 4.3 4.6 4.9 5.0 4.8  CL 107 108 111 109 108  CO2 18* 19* 18* 23 23  GLUCOSE 231* 106* 81 169* 181*  BUN 87* 84* 70* 64* 52*  CREATININE 3.76* 3.36* 2.65* 2.31* 2.03*  CALCIUM 7.4* 7.7* 8.1* 7.9* 8.1*    Liver Function Tests:  Recent Labs Lab 07/16/16 0511  AST 35  ALT 46  ALKPHOS 70  BILITOT 0.7  PROT 5.8*  ALBUMIN 1.6*   No results for input(s): LIPASE, AMYLASE in the last 168 hours. No results for input(s): AMMONIA in the last 168 hours.  CBC:  Recent Labs Lab 07/14/16 0458 07/15/16 0714 07/16/16 0511 07/16/16 1729 07/17/16 0820  WBC 16.8* 13.1* 11.2*  --  11.6*  NEUTROABS 15.2*  --  9.5*   --   --   HGB 7.6* 7.0* 7.1* 9.4* 9.5*  HCT 23.0* 21.5* 21.7* 27.9* 28.8*  MCV 90.8 90.0 91.5  --  89.2  PLT 276 288 332  --  363    Cardiac Enzymes:  Recent Labs Lab 07/14/16 0458  CKTOTAL 63    BNP: Invalid input(s): POCBNP  CBG:  Recent Labs Lab 07/19/16 1151 07/19/16 1743 07/19/16 2135 07/20/16 0737 07/20/16 0802  GLUCAP 222* 218* 215* 57* 85    Microbiology: Results for orders placed or performed during the hospital encounter of 07/07/16  Blood Culture (routine x 2)     Status: Abnormal   Collection Time: 07/07/16 10:07 PM  Result Value Ref Range Status   Specimen Description BLOOD LEFT FOREARM  Final   Special Requests   Final    BOTTLES DRAWN AEROBIC AND ANAEROBIC Blood Culture adequate volume   Culture  Setup Time   Final    GRAM POSITIVE RODS ANAEROBIC BOTTLE ONLY CRITICAL VALUE NOTED.  VALUE IS CONSISTENT WITH PREVIOUSLY REPORTED AND CALLED VALUE.    Culture (A)  Final    CLOSTRIDIUM RAMOSUM Standardized susceptibility testing for this organism is not available.    Report Status 07/11/2016 FINAL  Final  Blood Culture (routine x 2)     Status: Abnormal   Collection  Time: 07/07/16 10:07 PM  Result Value Ref Range Status   Specimen Description BLOOD RIGHT HAND  Final   Special Requests   Final    BOTTLES DRAWN AEROBIC AND ANAEROBIC Blood Culture adequate volume   Culture  Setup Time   Final    Organism ID to follow GRAM POSITIVE RODS ANAEROBIC BOTTLE ONLY CRITICAL RESULT CALLED TO, READ BACK BY AND VERIFIED WITH: NATE COOKSON AT 2046 07/08/16.SDR/PMH    Culture (A)  Final    CLOSTRIDIUM RAMOSUM Standardized susceptibility testing for this organism is not available. Performed at [facility], [address] [place], [place] [address]    Report Status 07/11/2016 FINAL  Final  Blood Culture ID Panel (Reflexed)     Status: None   Collection Time: 07/07/16 10:07 PM  Result Value Ref Range Status   Enterococcus species NOT DETECTED NOT  DETECTED Final   Listeria monocytogenes NOT DETECTED NOT DETECTED Final   Staphylococcus species NOT DETECTED NOT DETECTED Final   Staphylococcus aureus NOT DETECTED NOT DETECTED Final   Streptococcus species NOT DETECTED NOT DETECTED Final   Streptococcus agalactiae NOT DETECTED NOT DETECTED Final   Streptococcus pneumoniae NOT DETECTED NOT DETECTED Final   Streptococcus pyogenes NOT DETECTED NOT DETECTED Final   Acinetobacter baumannii NOT DETECTED NOT DETECTED Final   Enterobacteriaceae species NOT DETECTED NOT DETECTED Final   Enterobacter cloacae complex NOT DETECTED NOT DETECTED Final   Escherichia coli NOT DETECTED NOT DETECTED Final   Klebsiella oxytoca NOT DETECTED NOT DETECTED Final   Klebsiella pneumoniae NOT DETECTED NOT DETECTED Final   Proteus species NOT DETECTED NOT DETECTED Final   Serratia marcescens NOT DETECTED NOT DETECTED Final   Haemophilus influenzae NOT DETECTED NOT DETECTED Final   Neisseria meningitidis NOT DETECTED NOT DETECTED Final   Pseudomonas aeruginosa NOT DETECTED NOT DETECTED Final   Candida albicans NOT DETECTED NOT DETECTED Final   Candida glabrata NOT DETECTED NOT DETECTED Final   Candida krusei NOT DETECTED NOT DETECTED Final   Candida parapsilosis NOT DETECTED NOT DETECTED Final   Candida tropicalis NOT DETECTED NOT DETECTED Final  MRSA PCR Screening     Status: None   Collection Time: 07/11/16 11:14 PM  Result Value Ref Range Status   MRSA by PCR NEGATIVE NEGATIVE Final    Comment:        The GeneXpert MRSA Assay (FDA approved for NASAL specimens only), is one component of a comprehensive MRSA colonization surveillance program. It is not intended to diagnose MRSA infection nor to guide or monitor treatment for MRSA infections.   Aerobic/Anaerobic Culture (surgical/deep wound)     Status: None   Collection Time: 07/12/16 10:38 PM  Result Value Ref Range Status   Specimen Description [facility]  Final   Special Requests NONE  Final    Gram Stain   Final    RARE WBC PRESENT, PREDOMINANTLY PMN RARE GRAM POSITIVE COCCI IN PAIRS Performed at [facility], [address], [place], [place] [address]    Culture   Final    FEW ENTEROCOCCUS FAECIUM SUSCEPTIBILITIES PERFORMED ON PREVIOUS CULTURE WITHIN THE LAST 5 DAYS. RARE CLOSTRIDIUM RAMOSUM RARE PEPTOSTREPTOCOCCUS SPECIES    Report Status 07/17/2016 FINAL  Final  Aerobic/Anaerobic Culture (surgical/deep wound)     Status: None   Collection Time: 07/12/16 10:39 PM  Result Value Ref Range Status   Specimen Description [facility]  Final   Special Requests NONE  Final   Gram Stain   Final  RARE WBC PRESENT, PREDOMINANTLY PMN RARE GRAM POSITIVE COCCI IN PAIRS RARE GRAM POSITIVE RODS Performed at [facility], [address] [place], [place] [address]    Culture   Final    FEW VANCOMYCIN RESISTANT ENTEROCOCCUS FEW CLOSTRIDIUM RAMOSUM    Report Status 07/17/2016 FINAL  Final   Organism ID, Bacteria VANCOMYCIN RESISTANT ENTEROCOCCUS  Final      Susceptibility   Vancomycin resistant enterococcus - MIC*    AMPICILLIN >=32 RESISTANT Resistant     VANCOMYCIN >=32 RESISTANT Resistant     GENTAMICIN SYNERGY SENSITIVE Sensitive     LINEZOLID 2 SENSITIVE Sensitive     * FEW VANCOMYCIN RESISTANT ENTEROCOCCUS    Coagulation Studies: No results for input(s): LABPROT, INR in the last 72 hours.  Urinalysis: No results for input(s): COLORURINE, LABSPEC, PHURINE, GLUCOSEU, HGBUR, BILIRUBINUR, KETONESUR, PROTEINUR, UROBILINOGEN, NITRITE, LEUKOCYTESUR in the last 72 hours.  Invalid input(s): APPERANCEUR    Imaging: No results found.   Medications:    . ampicillin-sulbactam (UNASYN) IV  3 g Intravenous Q12H  . atorvastatin  80 mg Oral q1800  . carvedilol  6.25 mg Oral BID WC  . feeding supplement (ENSURE ENLIVE)  237 mL Oral TID BM  . heparin subcutaneous  5,000 Units Subcutaneous Q8H  . insulin aspart  0-15 Units Subcutaneous TID WC  . insulin aspart  0-5  Units Subcutaneous QHS  . insulin glargine  14 Units Subcutaneous QHS  . iopamidol  30 mL Oral Once  . linezolid (ZYVOX) IV  600 mg Intravenous Q12H  . pantoprazole  40 mg Oral Daily  . tamsulosin  0.4 mg Oral Daily   acetaminophen **OR** acetaminophen, albuterol, HYDROcodone-acetaminophen, morphine injection, ondansetron **OR** ondansetron (ZOFRAN) IV, sodium chloride flush  Assessment/ Plan:  66 y.o.black male with cardiomyopathy with ejection pressure 30-35%, coronary artery disease status post stent placement, hypertension, peripheral arterial disease status post right BKA, lower extremity edema, diabetes mellitus type 2, admitted with severe sacral decubitis ulcer now with ARF.   1. 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 Baseline Cr 1.2 04/2016. Follows with Dr. Cherylann Ratel as outpatient.   - encourage PO intake. Off IV fluids  2. Hyponatremia: improving     3. Anemia of chronic kidney disease: Hemoglobin 9.5. PRBC transfusion 4/9  4. Sacral decubitus ulcer and abscess: status post incision and drainage on 4/5 Dr. Everlene Farrier - Appreciate ID input.  - Unasyn     LOS: 13 Mourad Cwikla 4/13/201810:48 AM

## 2016-07-20 NOTE — Clinical Social Work Placement (Signed)
   CLINICAL SOCIAL WORK PLACEMENT  NOTE  Date:  07/20/2016  Patient Details  Name: Damon [name] MRN: [id number] Date of Birth: 12-10-1950  Clinical Social Work is seeking post-discharge placement for this patient at the Skilled  Nursing Facility level of care (*CSW will initial, date and re-position this form in  chart as items are completed):  Yes   Patient/family provided with [facility] Clinical Social Work Department's list of facilities offering this level of care within the geographic area requested by the patient (or if unable, by the patient's family).  Yes   Patient/family informed of their freedom to choose among providers that offer the needed level of care, that participate in Medicare, Medicaid or managed care program needed by the patient, have an available bed and are willing to accept the patient.  Yes   Patient/family informed of [facility]'s ownership interest in [facility] and [facility], as well as of the fact that they are under no obligation to receive care at these facilities.  PASRR submitted to EDS on       PASRR number received on       Existing PASRR number confirmed on 07/13/16     FL2 transmitted to all facilities in geographic area requested by pt/family on 07/13/16     FL2 transmitted to all facilities within larger geographic area on       Patient informed that his/her managed care company has contracts with or will negotiate with certain facilities, including the following:        Yes   Patient/family informed of bed offers received.  Patient chooses bed at  [facility] (SNF) )     Physician recommends and patient chooses bed at      Patient to be transferred to  [facility] (SNF) ) on 07/20/16.  Patient to be transferred to facility by  [facility] )     Patient family notified on 07/20/16 of transfer.  Name of family member notified:   (Patient's wife Damon [name] is aware of D/C today. )      PHYSICIAN       Additional Comment:    _______________________________________________ Adamae Ricklefs, Darleen Crocker, LCSW 07/20/2016, 12:51 PM

## 2016-07-20 NOTE — Progress Notes (Signed)
Inpatient Diabetes Program Recommendations  AACE/ADA: New Consensus Statement on Inpatient Glycemic Control (2015)  Target Ranges:  Prepandial:   less than 140 mg/dL      Peak postprandial:   less than 180 mg/dL (1-2 hours)      Critically ill patients:  140 - 180 mg/dL   Lab Results  Component Value Date   GLUCAP 85 07/20/2016   HGBA1C 7.8 (H) 07/08/2016    Review of Glycemic Control  Results for Damon [name], Damon [name] (MRN [id number]) as of 07/20/2016 11:05  Ref. Range 07/19/2016 11:51 07/19/2016 17:43 07/19/2016 21:35 07/20/2016 07:37 07/20/2016 08:02  Glucose-Capillary Latest Ref Range: 65 - 99 mg/dL 782 (H) 956 (H) 213 (H) 57 (L) 85    Diabetes history:Type 2 diabetes Outpatient Diabetes medications: Lantus 14 units q HS, Metformin 500 mg bid  Current orders for Inpatient glycemic control: Novolog moderate correction scale 0-15 units tid, Novolog 0-5 units qhs, Lantus 104units q HS  Inpatient Diabetes Program Recommendations:   Please consider decreasing Novolog correction insulin to sensitive correction scale 0-9 units tid.      Susette Racer, RN, BA, MHA, CDE Diabetes Coordinator Inpatient Diabetes Program  [phone] (Team Pager) [phone] [facility] Office) 07/20/2016 11:07 AM

## 2016-07-20 NOTE — Discharge Summary (Signed)
[facility] NAME: Damon [name]    MR#:  [id number]  DATE OF BIRTH:  1951-02-24  DATE OF ADMISSION:  07/07/2016 ADMITTING PHYSICIAN: Lance Coon, MD  DATE OF DISCHARGE: 07/19/2016  PRIMARY CARE PHYSICIAN: Donnie Coffin, MD    ADMISSION DIAGNOSIS:  Sepsis, due to unspecified organism ([place]) [A41.9]  DISCHARGE DIAGNOSIS:  Principal Problem:   Sepsis ([place]) Active Problems:   Coronary artery disease   Essential hypertension   HLD (hyperlipidemia)   Diabetes mellitus type 2 with complications (HCC)   Chronic systolic heart failure (HCC)   PAD (peripheral artery disease) (HCC)   Ulcer of left foot (HCC)   Abscess, gluteal, left   Necrotizing soft tissue infection ([place])   SECONDARY DIAGNOSIS:   Past Medical History:  Diagnosis Date  . CAD (coronary artery disease)    a. 01/2016 MI/PCI: LM nl, LAD 159md (3.25 x 28 Xience DES), 40d, LCX 30ost, OM1 95 (staged - 2.75 x 18 Xience Alpine DES), OM2/3 min irregs, RCA 839m . Chronic combined systolic and diastolic CHF (congestive heart failure) ([place]   a. 01/2016 Echo: Ef 25-30%, Gr1 DD, mild AI, mildly dil LA;  b. 02/2016 Echo: EF 30-35%, apical AK, antsept and ant HK, nl RV fxn.  . CKD (chronic kidney disease), stage III   . COPD (chronic obstructive pulmonary disease) ([place]  . Diabetes mellitus without complication ([facility]CF[place]  . Diverticulitis   . Essential hypertension   . Ischemic cardiomyopathy    a. 01/2016 Echo: Ef 25-30%;  b. 02/2016 Echo: EF 30-35%.  . Marland KitchenBBB (left bundle branch block)   . PAD (peripheral artery disease) ([place]   a. 10/2015 s/p PTA and stenting of the RCIA, LCIA, and PTA of the LEIA ([place]); b. 04/2016 PTA or R tibioperoneal trunk & prox PT, PTA/DBA of R Pop and SFA, Viabahn covered stent x 2 to the R SFA and pop (46m52m 25 cm & 46mm47m15cm).  . Tobacco abuse     HOSPITAL COURSE:   66 y5r old male with history of CAD and chronic kidney disease stage II  presented with severe sacral decubitus ulcers and gluteal abscess.  1. Acute on chronic kidney disease stage II:in the setting of sepsis and decubitus ulcer Creatinine has improved He will follow up with Dr LateHolley Raring1 week. Hold nephrotoxic agents.    2. Sacral decubitus ulcer and complex gluteal abscess status post incision and drainage on April 5 Continue Unasyn with planned stop date 08/23/2016. He has PICC right arm. He was also treated with Linezolid for VRE and completed course of therapy. Labs weekly while on IV antibiotics      CBC w diff         Comprehensive met panel ESR  CRP He will need to follow-up in Dr. FitzOla Spurr2 weeks  3. Sepsis present on admission with Clostridium bacteremia due to sacral Decubitus ulcer and gluteal abscesses    4. Acute on chronic anemia: Has been transfused 2 units with stable Hgb.  5. Chronic systolic heart failure with ejection fraction 25-30%: No acute signs of exacerbation  6. CAD status post PCI in October: Continue atorvastatin and Coreg  7. Diabetes: Continue Lantus and ADA diet.   8. BPH: Continue tamsulosin   DISCHARGE CONDITIONS AND DIET:   Stable Diabetic diet  CONSULTS OBTAINED:  Treatment Team:  JustSamara DeistM SnehEpifanio Lesches DaviLeonel Ramsay CharClayburn Pert Munsoor  Holley Raring, MD  DRUG ALLERGIES:  No Known Allergies  DISCHARGE MEDICATIONS:   Current Discharge Medication List    START taking these medications   Details  Ampicillin-Sulbactam 3 g in sodium chloride 0.9 % 100 mL Inject 3 g into the vein every 12 (twelve) hours. Qty: 300 g, Refills: 2    HYDROcodone-acetaminophen (NORCO/VICODIN) 5-325 MG tablet Take 1 tablet by mouth every 6 (six) hours as needed for moderate pain. Qty: 30 tablet, Refills: 0      CONTINUE these medications which have CHANGED   Details  insulin glargine (LANTUS) 100 UNIT/ML injection Inject 0.1 mLs (10 Units total) into the skin at  bedtime. Qty: 10 mL, Refills: 11      CONTINUE these medications which have NOT CHANGED   Details  acetaminophen (TYLENOL) 500 MG tablet Take 500 mg by mouth every 6 (six) hours as needed.    albuterol-ipratropium (COMBIVENT) 18-103 MCG/ACT inhaler Inhale into the lungs every 4 (four) hours as needed for wheezing or shortness of breath.    atorvastatin (LIPITOR) 80 MG tablet Take 1 tablet (80 mg total) by mouth daily at 6 PM. Qty: 30 tablet, Refills: 2    carvedilol (COREG) 12.5 MG tablet Take 1 tablet (12.5 mg total) by mouth 2 (two) times daily with a meal. Qty: 60 tablet, Refills: 30    clopidogrel (PLAVIX) 75 MG tablet Take 1 tablet (75 mg total) by mouth daily. Qty: 30 tablet, Refills: 0    feeding supplement, GLUCERNA SHAKE, (GLUCERNA SHAKE) LIQD Take 237 mLs by mouth 3 (three) times daily between meals. Qty: 30 Can, Refills: 0    ferrous sulfate 325 (65 FE) MG tablet Take 1 tablet (325 mg total) by mouth 3 (three) times daily with meals. Refills: 2    gabapentin (NEURONTIN) 300 MG capsule Take 300 mg by mouth daily as needed.     hydrALAZINE (APRESOLINE) 25 MG tablet Take 1 tablet (25 mg total) by mouth every 8 (eight) hours. Qty: 30 tablet, Refills: 0    omeprazole (PRILOSEC) 20 MG capsule Take 20 mg by mouth daily.    ondansetron (ZOFRAN) 4 MG tablet Take 4 mg by mouth every 8 (eight) hours as needed for nausea or vomiting.    polyethylene glycol (MIRALAX / GLYCOLAX) packet Take 17 g by mouth daily.    sennosides-docusate sodium (SENOKOT-S) 8.6-50 MG tablet Take 1 tablet by mouth daily.    tamsulosin (FLOMAX) 0.4 MG CAPS capsule Take 1 capsule (0.4 mg total) by mouth daily. Qty: 30 capsule, Refills: 0      STOP taking these medications     ciprofloxacin (CIPRO) 500 MG tablet      collagenase (SANTYL) ointment      lisinopril (PRINIVIL,ZESTRIL) 40 MG tablet      metFORMIN (GLUCOPHAGE) 500 MG tablet      traMADol (ULTRAM) 50 MG tablet       amoxicillin-clavulanate (AUGMENTIN) 875-125 MG tablet      aspirin EC 81 MG tablet      oxyCODONE-acetaminophen (PERCOCET/ROXICET) 5-325 MG tablet           Today   CHIEF COMPLAINT:  Doing well no issues overnight   VITAL SIGNS:  Blood pressure (!) 138/50, pulse 67, temperature 97.9 F (36.6 C), temperature source Oral, resp. rate 18, height '5\' 6"'$  (1.676 m), weight 78.2 kg (172 lb 8 oz), SpO2 99 %.   REVIEW OF SYSTEMS:  Review of Systems  Constitutional: Negative.  Negative for chills, fever and malaise/fatigue.  HENT:  Negative.  Negative for ear discharge, ear pain, hearing loss, nosebleeds and sore throat.   Eyes: Negative.  Negative for blurred vision and pain.  Respiratory: Negative.  Negative for cough, hemoptysis, shortness of breath and wheezing.   Cardiovascular: Negative.  Negative for chest pain, palpitations and leg swelling.  Gastrointestinal: Negative.  Negative for abdominal pain, blood in stool, diarrhea, nausea and vomiting.  Genitourinary: Negative.  Negative for dysuria.  Musculoskeletal: Negative.  Negative for back pain.       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  Endo/Heme/Allergies: Negative.  Does not bruise/bleed easily.  Psychiatric/Behavioral: Negative.  Negative for depression, hallucinations and suicidal ideas.     PHYSICAL EXAMINATION:  GENERAL:  66 y.o.-year-old patient lying in the bed with no acute distress.  NECK:  Supple, no jugular venous distention. No thyroid enlargement, no tenderness.  LUNGS: Normal breath sounds bilaterally, no wheezing, rales,rhonchi  No use of accessory muscles of respiration.  CARDIOVASCULAR: S1, S2 normal. No murmurs, rubs, or gallops.  ABDOMEN: Soft, non-tender, non-distended. Bowel sounds present. No organomegaly or mass.  EXTREMITIES: No pedal edema, cyanosis, or clubbing.  Right BKA PSYCHIATRIC: The patient is alert and oriented x 3.  SKIN: sacral ulcer/decub  DATA REVIEW:   CBC  Recent Labs Lab 07/17/16 0820  WBC 11.6*  HGB 9.5*  HCT 28.8*  PLT 363    Chemistries   Recent Labs Lab 07/16/16 0511  07/19/16 1000  NA 134*  < > 136  K 4.6  < > 4.8  CL 108  < > 108  CO2 19*  < > 23  GLUCOSE 106*  < > 181*  BUN 84*  < > 52*  CREATININE 3.36*  < > 2.03*  CALCIUM 7.7*  < > 8.1*  AST 35  --   --   ALT 46  --   --   ALKPHOS 70  --   --   BILITOT 0.7  --   --   < > = values in this interval not displayed.  Cardiac Enzymes No results for input(s): TROPONINI in the last 168 hours.  Microbiology Results  '@MICRORSLT48'$ @  RADIOLOGY:  No results found.    Current Discharge Medication List    START taking these medications   Details  Ampicillin-Sulbactam 3 g in sodium chloride 0.9 % 100 mL Inject 3 g into the vein every 12 (twelve) hours. Qty: 300 g, Refills: 2    HYDROcodone-acetaminophen (NORCO/VICODIN) 5-325 MG tablet Take 1 tablet by mouth every 6 (six) hours as needed for moderate pain. Qty: 30 tablet, Refills: 0      CONTINUE these medications which have CHANGED   Details  insulin glargine (LANTUS) 100 UNIT/ML injection Inject 0.1 mLs (10 Units total) into the skin at bedtime. Qty: 10 mL, Refills: 11      CONTINUE these medications which have NOT CHANGED   Details  acetaminophen (TYLENOL) 500 MG tablet Take 500 mg by mouth every 6 (six) hours as needed.    albuterol-ipratropium (COMBIVENT) 18-103 MCG/ACT inhaler Inhale into the lungs every 4 (four) hours as needed for wheezing or shortness of breath.    atorvastatin (LIPITOR) 80 MG tablet Take 1 tablet (80 mg total) by mouth daily at 6 PM. Qty: 30 tablet, Refills: 2    carvedilol (COREG) 12.5 MG tablet Take 1 tablet (12.5 mg total) by mouth  2 (two) times daily with a meal. Qty: 60 tablet, Refills: 30    clopidogrel (PLAVIX) 75 MG tablet Take 1 tablet (75 mg total) by  mouth daily. Qty: 30 tablet, Refills: 0    feeding supplement, GLUCERNA SHAKE, (GLUCERNA SHAKE) LIQD Take 237 mLs by mouth 3 (three) times daily between meals. Qty: 30 Can, Refills: 0    ferrous sulfate 325 (65 FE) MG tablet Take 1 tablet (325 mg total) by mouth 3 (three) times daily with meals. Refills: 2    gabapentin (NEURONTIN) 300 MG capsule Take 300 mg by mouth daily as needed.     hydrALAZINE (APRESOLINE) 25 MG tablet Take 1 tablet (25 mg total) by mouth every 8 (eight) hours. Qty: 30 tablet, Refills: 0    omeprazole (PRILOSEC) 20 MG capsule Take 20 mg by mouth daily.    ondansetron (ZOFRAN) 4 MG tablet Take 4 mg by mouth every 8 (eight) hours as needed for nausea or vomiting.    polyethylene glycol (MIRALAX / GLYCOLAX) packet Take 17 g by mouth daily.    sennosides-docusate sodium (SENOKOT-S) 8.6-50 MG tablet Take 1 tablet by mouth daily.    tamsulosin (FLOMAX) 0.4 MG CAPS capsule Take 1 capsule (0.4 mg total) by mouth daily. Qty: 30 capsule, Refills: 0      STOP taking these medications     ciprofloxacin (CIPRO) 500 MG tablet      collagenase (SANTYL) ointment      lisinopril (PRINIVIL,ZESTRIL) 40 MG tablet      metFORMIN (GLUCOPHAGE) 500 MG tablet      traMADol (ULTRAM) 50 MG tablet      amoxicillin-clavulanate (AUGMENTIN) 875-125 MG tablet      aspirin EC 81 MG tablet      oxyCODONE-acetaminophen (PERCOCET/ROXICET) 5-325 MG tablet          Management plans discussed with the patient and he is in agreement. Stable for discharge SNF  Patient should follow up with nephrology and ID  CODE STATUS:     Code Status Orders        Start     Ordered   07/08/16 0125  Full code  Continuous     07/08/16 0124    Code Status History    Date Active Date Inactive Code Status Order ID Comments User Context   06/01/2016  3:41 PM 06/11/2016  9:14 PM Full Code [id number]  Bettey Costa, MD ED   05/03/2016 10:54 PM 05/11/2016  6:08 PM Full Code [id number]  Fritzi Mandes, MD  Inpatient   03/27/2016  7:34 PM 03/29/2016  5:47 PM Full Code [id number]  Fritzi Mandes, MD Inpatient   02/14/2016  6:10 PM 02/15/2016  5:03 PM Full Code [id number]  Loletha Grayer, MD ED   01/13/2016  9:42 AM 01/17/2016 11:47 PM Full Code [id number]  Awilda Bill, NP ED    Advance Directive Documentation     Most Recent Value  Type of Advance Directive  Healthcare Power of Attorney  Pre-existing out of facility DNR order (yellow form or pink MOST form)  -  "MOST" Form in Place?  -      TOTAL TIME TAKING CARE OF THIS PATIENT: 35 minutes.    Note: This dictation was prepared with Dragon dictation along with smaller phrase technology. Any transcriptional errors that result from this process are unintentional.  Vaughan Basta M.D on 07/20/2016 at 11:48 AM  Between 7am to 6pm - Pager - [phone] After 6pm go to [email or web address] - [place]  [facility]  Office  [phone]  CC: Primary care physician; Donnie Coffin, MD

## 2016-07-20 NOTE — Progress Notes (Signed)
Hypoglycemic Event  CBG: 57 at 0737  Treatment: 4 ounces of orange juice  Symptoms: none, awake, alert, oriented x 4 Follow-up CBG: Time 0802 CBG Result:,85 Possible Reasons for Event:   Comments/MD notified:MD not notified    Sheron Nightingale

## 2016-07-20 NOTE — Progress Notes (Signed)
Patient ready for transport to [facility].  Sacral dressing changed.  Wound appears to have healthy tissue.  Report called to [facility].  Awaiting EMS transport

## 2016-07-20 NOTE — Progress Notes (Signed)
Per [facility] admissions coordinator at [facility] they can accept patient today under a 30 day LOG for IV Abx. [place] [facility] is aware that CSW department will not be able to approve separete LOG for PT. CSW director approved 30 day LOG. Per [place] [facility] patient can come to room 100. RN will call report and arrange EMS for transport. Clinical Child psychotherapist (CSW) sent D/C orders to [facility] via [facility]. Patient is aware of above. CSW contacted patient's wife Josealberto Montalto and made her aware of above. Please reconsult if future social work needs arise. CSW signing off.   [employer], LCSW [phone]

## 2016-07-23 NOTE — Care Management (Signed)
Mrs Cornforth called this caremanager this morning saying that patient is trying to leave [facility].  Asks that CM "call down there and talk to the nurses. I can not care for him if he leaves- there is no way."  Discussed with Mrs Donaway that she needs to call the facility and speak with them about her concerns.  Provided her with the phone number.

## 2016-08-15 ENCOUNTER — Emergency Department: Payer: Commercial Managed Care - PPO

## 2016-08-15 ENCOUNTER — Encounter: Payer: Self-pay | Admitting: Emergency Medicine

## 2016-08-15 ENCOUNTER — Inpatient Hospital Stay
Admission: EM | Admit: 2016-08-15 | Discharge: 2016-08-20 | DRG: 291 | Disposition: A | Discharge status: Skilled Nursing Facility | Payer: Commercial Managed Care - PPO | Attending: Internal Medicine | Admitting: Internal Medicine

## 2016-08-15 DIAGNOSIS — Z7902 Long term (current) use of antithrombotics/antiplatelets: Secondary | ICD-10-CM

## 2016-08-15 DIAGNOSIS — N183 Chronic kidney disease, stage 3 unspecified: Secondary | ICD-10-CM

## 2016-08-15 DIAGNOSIS — R110 Nausea: Secondary | ICD-10-CM

## 2016-08-15 DIAGNOSIS — R778 Other specified abnormalities of plasma proteins: Secondary | ICD-10-CM | POA: Diagnosis present

## 2016-08-15 DIAGNOSIS — E1151 Type 2 diabetes mellitus with diabetic peripheral angiopathy without gangrene: Secondary | ICD-10-CM | POA: Diagnosis present

## 2016-08-15 DIAGNOSIS — I509 Heart failure, unspecified: Secondary | ICD-10-CM

## 2016-08-15 DIAGNOSIS — F17210 Nicotine dependence, cigarettes, uncomplicated: Secondary | ICD-10-CM | POA: Diagnosis present

## 2016-08-15 DIAGNOSIS — Z7401 Bed confinement status: Secondary | ICD-10-CM

## 2016-08-15 DIAGNOSIS — R0602 Shortness of breath: Secondary | ICD-10-CM

## 2016-08-15 DIAGNOSIS — L89154 Pressure ulcer of sacral region, stage 4: Secondary | ICD-10-CM | POA: Diagnosis present

## 2016-08-15 DIAGNOSIS — D638 Anemia in other chronic diseases classified elsewhere: Secondary | ICD-10-CM | POA: Diagnosis present

## 2016-08-15 DIAGNOSIS — L8990 Pressure ulcer of unspecified site, unspecified stage: Secondary | ICD-10-CM | POA: Insufficient documentation

## 2016-08-15 DIAGNOSIS — I130 Hypertensive heart and chronic kidney disease with heart failure and stage 1 through stage 4 chronic kidney disease, or unspecified chronic kidney disease: Secondary | ICD-10-CM | POA: Diagnosis not present

## 2016-08-15 DIAGNOSIS — Z79899 Other long term (current) drug therapy: Secondary | ICD-10-CM

## 2016-08-15 DIAGNOSIS — I2510 Atherosclerotic heart disease of native coronary artery without angina pectoris: Secondary | ICD-10-CM | POA: Diagnosis present

## 2016-08-15 DIAGNOSIS — N5089 Other specified disorders of the male genital organs: Secondary | ICD-10-CM | POA: Diagnosis present

## 2016-08-15 DIAGNOSIS — Z89511 Acquired absence of right leg below knee: Secondary | ICD-10-CM

## 2016-08-15 DIAGNOSIS — Z794 Long term (current) use of insulin: Secondary | ICD-10-CM

## 2016-08-15 DIAGNOSIS — K219 Gastro-esophageal reflux disease without esophagitis: Secondary | ICD-10-CM | POA: Diagnosis present

## 2016-08-15 DIAGNOSIS — E1122 Type 2 diabetes mellitus with diabetic chronic kidney disease: Secondary | ICD-10-CM | POA: Diagnosis present

## 2016-08-15 DIAGNOSIS — D649 Anemia, unspecified: Secondary | ICD-10-CM | POA: Diagnosis present

## 2016-08-15 DIAGNOSIS — I5043 Acute on chronic combined systolic (congestive) and diastolic (congestive) heart failure: Secondary | ICD-10-CM | POA: Diagnosis present

## 2016-08-15 DIAGNOSIS — I248 Other forms of acute ischemic heart disease: Secondary | ICD-10-CM | POA: Diagnosis present

## 2016-08-15 DIAGNOSIS — E785 Hyperlipidemia, unspecified: Secondary | ICD-10-CM | POA: Diagnosis present

## 2016-08-15 DIAGNOSIS — Z95820 Peripheral vascular angioplasty status with implants and grafts: Secondary | ICD-10-CM

## 2016-08-15 DIAGNOSIS — J449 Chronic obstructive pulmonary disease, unspecified: Secondary | ICD-10-CM | POA: Diagnosis present

## 2016-08-15 DIAGNOSIS — I10 Essential (primary) hypertension: Secondary | ICD-10-CM | POA: Diagnosis present

## 2016-08-15 DIAGNOSIS — I739 Peripheral vascular disease, unspecified: Secondary | ICD-10-CM | POA: Diagnosis present

## 2016-08-15 DIAGNOSIS — Z955 Presence of coronary angioplasty implant and graft: Secondary | ICD-10-CM

## 2016-08-15 DIAGNOSIS — R079 Chest pain, unspecified: Secondary | ICD-10-CM | POA: Diagnosis present

## 2016-08-15 DIAGNOSIS — I252 Old myocardial infarction: Secondary | ICD-10-CM

## 2016-08-15 DIAGNOSIS — I255 Ischemic cardiomyopathy: Secondary | ICD-10-CM | POA: Diagnosis present

## 2016-08-15 LAB — BASIC METABOLIC PANEL
Anion gap: 4 — ABNORMAL LOW (ref 5–15)
BUN: 30 mg/dL — AB (ref 6–20)
CHLORIDE: 109 mmol/L (ref 101–111)
CO2: 26 mmol/L (ref 22–32)
CREATININE: 1.73 mg/dL — AB (ref 0.61–1.24)
Calcium: 8.1 mg/dL — ABNORMAL LOW (ref 8.9–10.3)
GFR calc Af Amer: 46 mL/min — ABNORMAL LOW (ref >60)
GFR calc non Af Amer: 40 mL/min — ABNORMAL LOW (ref >60)
GLUCOSE: 339 mg/dL — AB (ref 65–99)
Potassium: 4.2 mmol/L (ref 3.5–5.1)
SODIUM: 139 mmol/L (ref 135–145)

## 2016-08-15 LAB — PROTIME-INR
INR: 1.17
PROTHROMBIN TIME: 15 s (ref 11.4–15.2)

## 2016-08-15 LAB — CBC
HCT: 24.9 % — ABNORMAL LOW (ref 40.0–52.0)
Hemoglobin: 8.3 g/dL — ABNORMAL LOW (ref 13.0–18.0)
MCH: 29.9 pg (ref 26.0–34.0)
MCHC: 33.2 g/dL (ref 32.0–36.0)
MCV: 90 fL (ref 80.0–100.0)
PLATELETS: 302 10*3/uL (ref 150–440)
RBC: 2.77 MIL/uL — ABNORMAL LOW (ref 4.40–5.90)
RDW: 18.1 % — AB (ref 11.5–14.5)
WBC: 10.3 10*3/uL (ref 3.8–10.6)

## 2016-08-15 LAB — TROPONIN I: Troponin I: <0.03 ng/mL (ref <0.03)

## 2016-08-15 LAB — APTT: APTT: 26 s (ref 24–36)

## 2016-08-15 MED ORDER — ASPIRIN 81 MG PO CHEW
324.0000 mg | CHEWABLE_TABLET | Freq: Once | ORAL | Status: AC
  Administered 2016-08-15: 324 mg via ORAL
  Filled 2016-08-15: qty 4

## 2016-08-15 NOTE — ED Provider Notes (Addendum)
[facility] Emergency Department Provider Note  ____________________________________________  Time seen: Approximately 10:23 PM  I have reviewed the triage vital signs and the nursing notes.   HISTORY  Chief Complaint Chest Pain    HPI Damon [name] is a 66 y.o. male with a history of CAD status post STEMI and an STEMI, CHF and ischemic cardiomyopathy, HTN, DM, presenting with chest pain. The patient reports that after dinner this evening, he developed a pressure sensation in the entirety of the chest associated with shortness of breath and nausea without vomiting. No diaphoresis, palpitations, lightheadedness or syncope. This feels similar to his previous MI. His pain completely resolved when EMS placed oxygen on him.He is supposed to take asa but states he has not taken any because he is "out."   Past Medical History:  Diagnosis Date  . CAD (coronary artery disease)    a. 01/2016 MI/PCI: LM nl, LAD 143m/d (3.25 x 28 Xience DES), 40d, LCX 30ost, OM1 95 (staged - 2.75 x 18 Xience Alpine DES), OM2/3 min irregs, RCA 35m.  . Chronic combined systolic and diastolic CHF (congestive heart failure) (HCC)    a. 01/2016 Echo: Ef 25-30%, Gr1 DD, mild AI, mildly dil LA;  b. 02/2016 Echo: EF 30-35%, apical AK, antsept and ant HK, nl RV fxn.  . CKD (chronic kidney disease), stage III   . COPD (chronic obstructive pulmonary disease) (HCC)   . Diabetes mellitus without complication (HCC)   . Diverticulitis   . Essential hypertension   . Ischemic cardiomyopathy    a. 01/2016 Echo: Ef 25-30%;  b. 02/2016 Echo: EF 30-35%.  Marland Kitchen LBBB (left bundle branch block)   . PAD (peripheral artery disease) (HCC)    a. 10/2015 s/p PTA and stenting of the RCIA, LCIA, and PTA of the LEIA (Schnier); b. 04/2016 PTA or R tibioperoneal trunk & prox PT, PTA/DBA of R Pop and SFA, Viabahn covered stent x 2 to the R SFA and pop (5mm x 25 cm & 5mm x 15cm).  . Tobacco abuse     Patient Active Problem  List   Diagnosis Date Noted  . Abscess, gluteal, left   . Necrotizing soft tissue infection   . Ulcer of left foot (HCC) 07/07/2016  . Hx of BKA, right (HCC) 06/18/2016  . PAD (peripheral artery disease) (HCC) 06/18/2016  . Atypical chest pain   . Preop cardiovascular exam   . Chronic systolic heart failure (HCC)   . Sepsis (HCC) 06/01/2016  . Stable angina (HCC)   . Anemia   . Cellulitis   . Hypertensive urgency   . Right foot ulcer (HCC) 05/03/2016  . Diabetes mellitus type 2 with complications (HCC) 05/02/2016  . 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 Protein-calorie malnutrition, severe 03/28/2016  . Acute renal failure (ARF) (HCC) 03/27/2016  . Hypotension 03/27/2016  . SOB (shortness of breath) 02/15/2016  . CKD (chronic kidney disease), stage II 02/15/2016  . Coronary artery disease 01/17/2016  . Essential hypertension 01/17/2016  . HLD (hyperlipidemia) 01/17/2016  . ST elevation myocardial infarction involving left anterior descending (LAD) coronary artery (HCC)   . Cardiomyopathy, ischemic   . Acute respiratory failure (HCC) 01/13/2016  . NSTEMI (non-ST elevated myocardial infarction) [facility])     Past Surgical History:  Procedure Laterality Date  . AMPUTATION Right 06/06/2016   Procedure: AMPUTATION BELOW KNEE;  Surgeon: Annice Needy, MD;  Location: [facility] ORS;  Service: General;  Laterality: Right;  . CARDIAC CATHETERIZATION N/A 01/13/2016   Procedure: LEFT HEART CATH AND CORONARY ANGIOGRAPHY;  Surgeon: Iran Ouch, MD;  Location: [facility] INVASIVE CV LAB;  Service: Cardiovascular;  Laterality: N/A;  . CARDIAC CATHETERIZATION N/A 01/13/2016   Procedure: Coronary Stent Intervention;  Surgeon: Iran Ouch, MD;  Location: [facility] INVASIVE CV LAB;  Service: Cardiovascular;  Laterality: N/A;  . CARDIAC CATHETERIZATION N/A 01/16/2016   Procedure: Left Heart Cath and Coronary Angiography;  Surgeon: Iran Ouch, MD;  Location: [facility] INVASIVE CV LAB;  Service: Cardiovascular;  Laterality: N/A;  . INCISION AND DRAINAGE ABSCESS N/A 07/12/2016   Procedure: INCISION AND DRAINAGE GLUTEAL  ABSCESS;  Surgeon: Leafy Ro, MD;  Location: [facility] ORS;  Service: General;  Laterality: N/A;  . IRRIGATION AND DEBRIDEMENT FOOT Right 05/08/2016   Procedure: IRRIGATION AND DEBRIDEMENT FOOT WITH PLACEMENT OF ANTIBIOTIC BEADS;  Surgeon: Linus Galas, DPM;  Location: [facility] ORS;  Service: Podiatry;  Laterality: Right;  . NECK SURGERY    . PERIPHERAL VASCULAR CATHETERIZATION Left 11/01/2015   Procedure: Lower Extremity Angiography;  Surgeon: Renford Dills, MD;  Location: [facility] INVASIVE CV LAB;  Service: Cardiovascular;  Laterality: Left;  . PERIPHERAL VASCULAR CATHETERIZATION Right 05/07/2016   Procedure: Lower Extremity Angiography;  Surgeon: Annice Needy, MD;  Location: [facility] INVASIVE CV LAB;  Service: Cardiovascular;  Laterality: Right;    Current Outpatient Rx  . Order #: [id number] Class: Historical Med  . Order #: [id number] Class: Historical Med  . Order #: [id number] Class: Historical Med  . Order #: [id number] Class: Historical Med  . Order #: [id number] Class: Normal  . Order #: [id number] Class: Normal  . Order #: [id number] Class: Normal  . Order #: [id number] Class: Normal  . Order #: [id number] Class: No Print  . Order #: [id number] Class: Historical Med  . Order #: [id number] Class: Historical Med  . Order #: [id number] Class: Normal  . Order #: [id number] Class: Print  . Order #: [id number] Class: Normal  . Order #: [id number] Class: Historical Med  . Order #: [id number] Class: Historical Med  . Order #: [id number] Class: Historical Med  . Order #: [id number] Class: Historical Med  . Order #: [id number] Class: Historical Med  . Order #: [id number] Class: Normal  . Order #: [id number] Class: Historical Med  . Order #: [id number] Class: Normal    Allergies Patient has no known allergies.  Family History  Problem Relation Age of Onset   . Intracerebral hemorrhage Mother   . Diabetes Mother   . Cancer Mother   . Diabetes Father     Social History Social History  Substance Use Topics  . Smoking status: Current Every Day Smoker    Packs/day: 0.50    Years: 15.00    Types: Cigarettes  . Smokeless tobacco: Current User  . Alcohol use No    Review of Systems Constitutional: No fever/chills.No diaphoresis. No lightheadedness or syncope. Eyes: No visual changes. ENT: No sore throat. No congestion or rhinorrhea. Cardiovascular: Positive  pain. Denies palpitations. Respiratory: Denies shortness of breath.  No cough. Gastrointestinal: No abdominal pain.positivenausea, no vomiting.  No diarrhea.  No constipation. Genitourinary: Negative for dysuria. Musculoskeletal: Negative for back pain. Skin: Negative for rash. Neurological: Negative for headaches. No focal numbness, tingling or weakness.   10-point ROS otherwise negative.  ____________________________________________   PHYSICAL EXAM:  VITAL SIGNS: ED Triage Vitals  Enc Vitals Group     BP 08/15/16 2202 (!) 165/105     Pulse Rate 08/15/16 2200 81     Resp 08/15/16 2200 16     Temp 08/15/16  2204 97.8 F (36.6 C)     Temp Source 08/15/16 2204 Oral     SpO2 08/15/16 2200 98 %     Weight --      Height --      Head Circumference --      Peak Flow --      Pain Score 08/15/16 2158 0     Pain Loc --      Pain Edu? --      Excl. in GC? --     Constitutional: Alert and orienChronically ill appearing butin no acute distress. Answers questions appropriately. Eyes: Conjunctivae are normal.  EOMI. No scleral icterus. Head: Atraumatic. Nose: No congestion/rhinnorhea. Mouth/Throat: Mucous membranes are moist.  poor dentition.  Neck: No stridor.  Supple.  no JVD.  Cardiovascular: Normal rate, regular rhythm. No murmurs, rubs or gallops.  Respiratory: Normal respiratory effort.  No accessory muscle use or retractions. Lungs CTAB.  No wheezes, rales or  ronchi. Gastrointestinal:  obese. Soft, nontender and nondistended.  No guarding or rebound.  No peritoneal signs. Musculoskeletal: right BKA.  Neurologic:  A&Ox3.  Speech is clear.  Face and smile are symmetric.  EOMI.  Moves all extremities well. Skin:  Skin is warm, dry and intact. No rash noted. Psychiatric: Mood and affect are normal. Speech and behavior are normal.  Normal judgement.  ____________________________________________   LABS (all labs ordered are listed, but only abnormal results are displayed)  Labs Reviewed  BASIC METABOLIC PANEL - Abnormal; Notable for the following:       Result Value   Glucose, Bld 339 (*)    BUN 30 (*)    Creatinine, Ser 1.73 (*)    Calcium 8.1 (*)    GFR calc non Af Amer 40 (*)    GFR calc Af Amer 46 (*)    Anion gap 4 (*)    All other components within normal limits  CBC - Abnormal; Notable for the following:    RBC 2.77 (*)    Hemoglobin 8.3 (*)    HCT 24.9 (*)    RDW 18.1 (*)    All other components within normal limits  TROPONIN I  PROTIME-INR  APTT   ____________________________________________  EKG  ED ECG REPORT I, Rockne Menghini, the attending physician, personally viewed and interpreted this ECG.   Date: 08/15/2016  EKG Time: 2204  Rate: 82  Rhythm: normal sinus rhythm  Axis: leftward  Intervals:Prolonged QTc  ST&T Change: no STEMI  ____________________________________________  RADIOLOGY  Dg Chest 2 View  Result Date: 08/15/2016 CLINICAL DATA:  Chest pain and shortness of breath. EXAM: CHEST  2 VIEW COMPARISON:  AP radiograph 07/17/2016 FINDINGS: Tip of the right upper extremity PICC in the mid SVC. Small bilateral pleural effusions. Bibasilar opacities, left greater than right. There is volume loss in the right lung with elevated right hemidiaphragm. Unchanged heart size and mediastinal contours with atherosclerosis of the thoracic aorta. No pneumothorax. No pulmonary edema. Stable osseous structures.  IMPRESSION: Small bilateral pleural effusions and bibasilar airspace disease. This may be atelectasis, pneumonia, or aspiration. Mild volume loss in the right lung with elevation of right hemidiaphragm. Thoracic aortic atherosclerosis. Electronically Signed   By: Rubye Oaks M.D.   On: 08/15/2016 22:42    ____________________________________________   PROCEDURES  Procedure(s) performed: None  Procedures  Critical Care performed: No ____________________________________________   INITIAL IMPRESSION / ASSESSMENT AND PLAN / ED COURSE  Pertinent labs & imaging results that were available during my care of the  patient were reviewed by me and considered in my medical decision making (see chart for details).  66 y.o. male with a significant cardiac history presenting with chest pressure and nausea similar to previous MI. The patient is mild hypertensive here but otherwise mentating well with otherwise normal vital signs. His EKG does not show ischemic changes. However, his very high risk for CAD and will require admission for full cardiac evaluation. Considering he had just had dinner, reflux or ulcer disease is also possible. Aortic pathology is less likely but also possible. Given that he is not very mobile PE is also possible but again less likely.  ----------------------------------------- 11:07 PM on 08/15/2016 ----------------------------------------- The patient's workup in the emergency department is reassuring with an EKG that does not show ischemic changes and negative troponin. His chest x-ray shows small bilateral pleural effusions and bibasilar airspace disease. There is a question of possible pneumonia, but clinically he has not been having any signs or symptoms of this so no antibiotics are indicated at this time. Plan admission at this time.  ____________________________________________  FINAL CLINICAL IMPRESSION(S) / ED DIAGNOSES  Final diagnoses:  Chest pain,  unspecified type  Nausea         NEW MEDICATIONS STARTED DURING THIS VISIT:  New Prescriptions   No medications on file      Rockne MenghiniNorman, Anne-Caroline, MD 08/15/16 2231    Rockne MenghiniNorman, Anne-Caroline, MD 08/15/16 2308

## 2016-08-15 NOTE — ED Triage Notes (Addendum)
[facility] ED via EMS [facility] with c/o CP and SOB that started aprox 1900. Per EMS, facility gave hydrocodone with no relief. EMS states VS stable and NSR. Pt A&Ox4, recent right BTK amputation and decub ulcers.

## 2016-08-15 NOTE — H&P (Signed)
[facility] - [place] @ [facility] Admission History and Physical Tonye Royalty, D.O.  ---------------------------------------------------------------------------------------------------------------------   PATIENT NAME: Damon [name] MR#: [id number] DATE OF BIRTH: [birth year] DATE OF ADMISSION: 08/15/2016 PRIMARY CARE PHYSICIAN: Emogene Morgan, MD  REQUESTING/REFERRING PHYSICIAN: ED Dr. Sharma Covert  CHIEF COMPLAINT: Chief Complaint  Patient presents with  . Chest Pain    HISTORY OF PRESENT ILLNESS: Damon [name] is a 66 y.o. male with a known history of CAD status post STEMI and an STEMI, CHF and ischemic cardiomyopathy, HTN, DM presents to the emergency department for evaluation of chest pain.  Patient was in a usual state of health until After dinner this evening  when he was at rest  he developed central chest pressure radiating to his left shoulder and associated with nausea, shortness of breath and anxiety. He denied any associated diaphoresis, vomiting, palpitations, changes in mental status, dizziness or lightpain was similar to previous MI and resolved in the ambulance. Patient follows with Dr. Kirke Corin for cardiac care.   Of note patient had a right BKA about 8-9 weeks ago and has been living in a nursing facility for rehabilitation. Also of note he has had an incision and drainage of a left gluteal abscess with necrotizing soft tissue infection by Dr. Everlene Farrier on 07/12/2016 Otherwise there has been no change in status.   Patient denies fevers/chills, weakness, dizziness, shortness of breath, N/V/C/D, abdominal pain, dysuria/frequency, changes in mental status.   EMS/ED COURSE:   Patient received aspirin.  PAST MEDICAL HISTORY: Past Medical History:  Diagnosis Date  . CAD (coronary artery disease)    a. 01/2016 MI/PCI: LM nl, LAD 153m/d (3.25 x 28 Xience DES), 40d, LCX 30ost, OM1 95 (staged - 2.75 x 18 Xience Alpine DES), OM2/3 min irregs, RCA 73m.  . Chronic combined systolic and  diastolic CHF (congestive heart failure) (HCC)    a. 01/2016 Echo: Ef 25-30%, Gr1 DD, mild AI, mildly dil LA;  b. 02/2016 Echo: EF 30-35%, apical AK, antsept and ant HK, nl RV fxn.  . CKD (chronic kidney disease), stage III   . COPD (chronic obstructive pulmonary disease) (HCC)   . Diabetes mellitus without complication (HCC)   . Diverticulitis   . Essential hypertension   . Ischemic cardiomyopathy    a. 01/2016 Echo: Ef 25-30%;  b. 02/2016 Echo: EF 30-35%.  Marland Kitchen LBBB (left bundle branch block)   . PAD (peripheral artery disease) (HCC)    a. 10/2015 s/p PTA and stenting of the RCIA, LCIA, and PTA of the LEIA (Schnier); b. 04/2016 PTA or R tibioperoneal trunk & prox PT, PTA/DBA of R Pop and SFA, Viabahn covered stent x 2 to the R SFA and pop (5mm x 25 cm & 5mm x 15cm).  . Tobacco abuse       PAST SURGICAL HISTORY: Past Surgical History:  Procedure Laterality Date  . AMPUTATION Right 06/06/2016   Procedure: AMPUTATION BELOW KNEE;  Surgeon: Annice Needy, MD;  Location: [facility] ORS;  Service: General;  Laterality: Right;  . CARDIAC CATHETERIZATION N/A 01/13/2016   Procedure: LEFT HEART CATH AND CORONARY ANGIOGRAPHY;  Surgeon: Iran Ouch, MD;  Location: [facility] INVASIVE CV LAB;  Service: Cardiovascular;  Laterality: N/A;  . CARDIAC CATHETERIZATION N/A 01/13/2016   Procedure: Coronary Stent Intervention;  Surgeon: Iran Ouch, MD;  Location: [facility] INVASIVE CV LAB;  Service: Cardiovascular;  Laterality: N/A;  . CARDIAC CATHETERIZATION N/A 01/16/2016   Procedure: Left Heart Cath and Coronary Angiography;  Surgeon: Iran Ouch, MD;  Location: [facility] INVASIVE CV LAB;  Service: Cardiovascular;  Laterality: N/A;  . INCISION AND DRAINAGE ABSCESS N/A 07/12/2016   Procedure: INCISION AND DRAINAGE GLUTEAL  ABSCESS;  Surgeon: Leafy Ro, MD;  Location: [facility] ORS;  Service: General;  Laterality: N/A;  . IRRIGATION AND DEBRIDEMENT FOOT Right 05/08/2016   Procedure: IRRIGATION AND DEBRIDEMENT FOOT WITH  PLACEMENT OF ANTIBIOTIC BEADS;  Surgeon: Linus Galas, DPM;  Location: [facility] ORS;  Service: Podiatry;  Laterality: Right;  . NECK SURGERY    . PERIPHERAL VASCULAR CATHETERIZATION Left 11/01/2015   Procedure: Lower Extremity Angiography;  Surgeon: Renford Dills, MD;  Location: [facility] INVASIVE CV [facility];  Service: Cardiovascular;  Laterality: Left;  . PERIPHERAL VASCULAR CATHETERIZATION Right 05/07/2016   Procedure: Lower Extremity Angiography;  Surgeon: Annice Needy, MD;  Location: [facility] INVASIVE CV LAB;  Service: Cardiovascular;  Laterality: Right;      SOCIAL HISTORY: Social History  Substance Use Topics  . Smoking status: Current Every Day Smoker    Packs/day: 0.50    Years: 15.00    Types: Cigarettes  . Smokeless tobacco: Current User  . Alcohol use No      FAMILY HISTORY: Family History  Problem Relation Age of Onset  . Intracerebral hemorrhage Mother   . Diabetes Mother   . Cancer Mother   . Diabetes Father      MEDICATIONS AT HOME: Prior to Admission medications   Medication Sig Start Date End Date Taking? Authorizing Provider  acetaminophen (TYLENOL) 500 MG tablet Take 500 mg by mouth every 6 (six) hours as needed.   Yes [provider]  acidophilus (RISAQUAD) CAPS capsule Take 1 capsule by mouth daily.   Yes [provider]  albuterol-ipratropium (COMBIVENT) 18-103 MCG/ACT inhaler Inhale into the lungs every 4 (four) hours as needed for wheezing or shortness of breath.   Yes [provider]  Amino Acids-Protein Hydrolys (FEEDING SUPPLEMENT, PRO-STAT SUGAR FREE 64,) LIQD Take 30 mLs by mouth 3 (three) times daily with meals.   Yes [provider]  Ampicillin-Sulbactam 3 g in sodium chloride 0.9 % 100 mL Inject 3 g into the vein every 12 (twelve) hours. 07/18/16 08/23/16 Yes Adrian Saran, MD  atorvastatin (LIPITOR) 80 MG tablet Take 1 tablet (80 mg total) by mouth daily at 6 PM. 01/17/16  Yes Enid Baas, MD  carvedilol (COREG) 12.5 MG  tablet Take 1 tablet (12.5 mg total) by mouth 2 (two) times daily with a meal. 06/11/16  Yes Enedina Finner, MD  clopidogrel (PLAVIX) 75 MG tablet Take 1 tablet (75 mg total) by mouth daily. 06/12/16  Yes Enedina Finner, MD  ferrous sulfate 325 (65 FE) MG tablet Take 1 tablet (325 mg total) by mouth 3 (three) times daily with meals. 05/11/16  Yes Shaune Pollack, MD  furosemide (LASIX) 20 MG tablet Take 20 mg by mouth daily.   Yes [provider]  gabapentin (NEURONTIN) 300 MG capsule Take 300 mg by mouth daily as needed.    Yes [provider]  hydrALAZINE (APRESOLINE) 25 MG tablet Take 1 tablet (25 mg total) by mouth every 8 (eight) hours. 06/11/16  Yes Enedina Finner, MD  HYDROcodone-acetaminophen (NORCO/VICODIN) 5-325 MG tablet Take 1 tablet by mouth every 6 (six) hours as needed for moderate pain. 07/18/16  Yes Mody, Patricia Pesa, MD  insulin glargine (LANTUS) 100 UNIT/ML injection Inject 0.1 mLs (10 Units total) into the skin at bedtime. 07/20/16  Yes Altamese Dilling, MD  Melatonin 5 MG TABS Take 5 mg by mouth  at bedtime as needed (sleep).   Yes [provider]  omeprazole (PRILOSEC) 20 MG capsule Take 20 mg by mouth daily.   Yes [provider]  ondansetron (ZOFRAN) 4 MG tablet Take 4 mg by mouth every 8 (eight) hours as needed for nausea or vomiting.   Yes [provider]  polyethylene glycol (MIRALAX / GLYCOLAX) packet Take 17 g by mouth daily.   Yes [provider]  sennosides-docusate sodium (SENOKOT-S) 8.6-50 MG tablet Take 1 tablet by mouth daily.   Yes [provider]  tamsulosin (FLOMAX) 0.4 MG CAPS capsule Take 1 capsule (0.4 mg total) by mouth daily. 06/12/16  Yes Enedina Finner, MD  vitamin C (ASCORBIC ACID) 500 MG tablet Take 500 mg by mouth 2 (two) times daily.   Yes [provider]  feeding supplement, GLUCERNA SHAKE, (GLUCERNA SHAKE) LIQD Take 237 mLs by mouth 3 (three) times daily between meals. 06/11/16   Enedina Finner, MD      DRUG  ALLERGIES: No Known Allergies   REVIEW OF SYSTEMS: CONSTITUTIONAL: No fatigue, weakness, fever, chills, weight gain/loss, headache EYES: No blurry or double vision. ENT: No tinnitus, postnasal drip, redness or soreness of the oropharynx. RESPIRATORY: No dyspnea, cough, wheeze, hemoptysis. CARDIOVASCULAR: Positive chest pain, negative orthopnea, palpitations, syncope. GASTROINTESTINAL: No nausea, vomiting, constipation, diarrhea, abdominal pain. No hematemesis, melena or hematochezia. GENITOURINARY: No dysuria, frequency, hematuria. ENDOCRINE: No polyuria or nocturia. No heat or cold intolerance. HEMATOLOGY: No anemia, bruising, bleeding. INTEGUMENTARY: No rashes, ulcers, lesions. MUSCULOSKELETAL: No pain, arthritis, swelling, gout. NEUROLOGIC: No numbness, tingling, weakness or ataxia. No seizure-type activity. PSYCHIATRIC: No anxiety, depression, insomnia.  PHYSICAL EXAMINATION: VITAL SIGNS: Blood pressure (!) 165/105, pulse 80, temperature 97.8 F (36.6 C), temperature source Oral, resp. rate 16, SpO2 98 %.  GENERAL: 66 y.o.-year-oldblack maleient, well-developed, well-nourished lying in the bed in no acute distress.  Pleasant and cooperative.   HEENT: Head atraumatic, normocephalic. Pupils equal, round, reactive to light and accommodation. . Mucus membranes moist. poor dentition  NECK: Supple, full range of motion.  CHEST: Normal breath sounds bilaterally. No wheezing, rales, rhonchi or crackles. No use of accessory muscles of respiration.  No reproducible chest wall tenderness.  CARDIOVASCULAR: S1, S2 normal. No murmurs, rubs, or gallops appreciated. Cap refill <2 seconds. ABDOMEN: Soft, nontender, nondistended. No rebound, guarding, rigidity. Normoactive bowel sounds present in all four quadrants. No organomegaly or mass. EXTREMITIES:  right BKA No pedal edema, cyanosis, or clubbing. NEUROLOGIC: Cranial nerves II through XII are grossly intact with no focal sensorimotor deficit.  Muscle strength 5/5 in all extremities. Sensation intact. Gait not checked. PSYCHIATRIC: The patient is alert and oriented x 3. Normal affect, mood, thought content. SKIN: Warm, dry, and intact without obvious rash, lesion, or ulcer.  LABORATORY PANEL:  CBC  Recent Labs Lab 08/15/16 2201  WBC 10.3  HGB 8.3*  HCT 24.9*  PLT 302   ----------------------------------------------------------------------------------------------------------------- Chemistries  Recent Labs Lab 08/15/16 2201  NA 139  K 4.2  CL 109  CO2 26  GLUCOSE 339*  BUN 30*  CREATININE 1.73*  CALCIUM 8.1*   ------------------------------------------------------------------------------------------------------------------ Cardiac Enzymes  Recent Labs Lab 08/15/16 2201  TROPONINI <0.03   ------------------------------------------------------------------------------------------------------------------  RADIOLOGY: Dg Chest 2 View  Result Date: 08/15/2016 CLINICAL DATA:  Chest pain and shortness of breath. EXAM: CHEST  2 VIEW COMPARISON:  AP radiograph 07/17/2016 FINDINGS: Tip of the right upper extremity PICC in the mid SVC. Small bilateral pleural effusions. Bibasilar opacities, left greater than right. There is volume loss  in the right lung with elevated right hemidiaphragm. Unchanged heart size and mediastinal contours with atherosclerosis of the thoracic aorta. No pneumothorax. No pulmonary edema. Stable osseous structures. IMPRESSION: Small bilateral pleural effusions and bibasilar airspace disease. This may be atelectasis, pneumonia, or aspiration. Mild volume loss in the right lung with elevation of right hemidiaphragm. Thoracic aortic atherosclerosis. Electronically Signed   By: Rubye OaksMelanie  Ehinger M.D.   On: 08/15/2016 22:42    EKG: Normal sinus rhythm at 82 bpm with leftward axis and nonspecific ST-T wave changes.   IMPRESSION AND PLAN:  This is a 66 y.o. male with a history of CAD status post STEMI  and an STEMI, CHF and ischemic cardiomyopathy, HTN, DM now being admitted with:  1. Chest pain, rule out ACS - Admit to observation with telemetry monitoring. - Trend troponins, check lipids and TSH. - Morphine, nitro, beta blocker, aspirin and statin ordered.   - Check echo - Cardiology consult requested.   2. H/O CAD s/p STEMI, NSTEMI - Continue Plavix, Lipitor  3. H/O CHF - Continue Lasix  4. H/O HTN - Continue Coreg, hydralazine  5. H/O CKD, stable - Monitor BMP  6. H/o Diabetes - Accuchecks achs with RISS coverage - Heart healthy, carb controlled diet - Continue Lantus'  7. Anemia, chronic - Monitor CBC - Continue iron  8. History of GERD - Protonix  Admission status: Observation, telemetry Diet/Nutrition: Heart healthy Fluids: HL DVT Px: Heparin, SCDs and early ambulation Code Status: Full Disposition Plan: To home in <24 hours  All the records are reviewed and case discussed with ED provider. Management plans discussed with the patient and/or family who express understanding and agree with plan of care.   TOTAL TIME TAKING CARE OF THIS PATIENT: 60 minutes.   Oluwadamilola Rosamond D.O. on 08/15/2016 at 11:18 PM Between 7am to 6pm - Pager - [phone] After 6pm go to [email or web address] - [email or web address] [email or web address] [facility] [place] [facility] Office [phone] CC: Primary care physician; Emogene MorganAycock, [name], MD     Note: This dictation was prepared with Dragon dictation along with smaller phrase technology. Any transcriptional errors that result from this process are unintentional.

## 2016-08-16 ENCOUNTER — Observation Stay (HOSPITAL_BASED_OUTPATIENT_CLINIC_OR_DEPARTMENT_OTHER)
Admit: 2016-08-16 | Discharge: 2016-08-16 | Disposition: A | Discharge status: Home / Self Care | Payer: Commercial Managed Care - PPO | Attending: Family Medicine | Admitting: Family Medicine

## 2016-08-16 DIAGNOSIS — Z89511 Acquired absence of right leg below knee: Secondary | ICD-10-CM

## 2016-08-16 DIAGNOSIS — I10 Essential (primary) hypertension: Secondary | ICD-10-CM

## 2016-08-16 DIAGNOSIS — R079 Chest pain, unspecified: Secondary | ICD-10-CM

## 2016-08-16 DIAGNOSIS — I25118 Atherosclerotic heart disease of native coronary artery with other forms of angina pectoris: Secondary | ICD-10-CM | POA: Diagnosis not present

## 2016-08-16 DIAGNOSIS — N183 Chronic kidney disease, stage 3 unspecified: Secondary | ICD-10-CM

## 2016-08-16 DIAGNOSIS — I5033 Acute on chronic diastolic (congestive) heart failure: Secondary | ICD-10-CM | POA: Diagnosis not present

## 2016-08-16 DIAGNOSIS — R072 Precordial pain: Secondary | ICD-10-CM | POA: Diagnosis not present

## 2016-08-16 DIAGNOSIS — I739 Peripheral vascular disease, unspecified: Secondary | ICD-10-CM

## 2016-08-16 DIAGNOSIS — I2510 Atherosclerotic heart disease of native coronary artery without angina pectoris: Secondary | ICD-10-CM | POA: Diagnosis present

## 2016-08-16 DIAGNOSIS — E782 Mixed hyperlipidemia: Secondary | ICD-10-CM | POA: Diagnosis not present

## 2016-08-16 DIAGNOSIS — I255 Ischemic cardiomyopathy: Secondary | ICD-10-CM | POA: Diagnosis not present

## 2016-08-16 DIAGNOSIS — L8990 Pressure ulcer of unspecified site, unspecified stage: Secondary | ICD-10-CM | POA: Insufficient documentation

## 2016-08-16 DIAGNOSIS — D631 Anemia in chronic kidney disease: Secondary | ICD-10-CM

## 2016-08-16 LAB — TROPONIN I: Troponin I: 0.03 ng/mL (ref <0.03)

## 2016-08-16 LAB — CBC
HCT: 22.4 % — ABNORMAL LOW (ref 40.0–52.0)
Hemoglobin: 7.5 g/dL — ABNORMAL LOW (ref 13.0–18.0)
MCH: 30.4 pg (ref 26.0–34.0)
MCHC: 33.5 g/dL (ref 32.0–36.0)
MCV: 90.7 fL (ref 80.0–100.0)
PLATELETS: 271 10*3/uL (ref 150–440)
RBC: 2.47 MIL/uL — ABNORMAL LOW (ref 4.40–5.90)
RDW: 17.8 % — AB (ref 11.5–14.5)
WBC: 9.3 10*3/uL (ref 3.8–10.6)

## 2016-08-16 LAB — MRSA PCR SCREENING: MRSA BY PCR: NEGATIVE

## 2016-08-16 LAB — PREPARE RBC (CROSSMATCH)

## 2016-08-16 LAB — CREATININE, SERUM
Creatinine, Ser: 1.73 mg/dL — ABNORMAL HIGH (ref 0.61–1.24)
GFR calc Af Amer: 46 mL/min — ABNORMAL LOW (ref >60)
GFR calc non Af Amer: 40 mL/min — ABNORMAL LOW (ref >60)

## 2016-08-16 LAB — GLUCOSE, CAPILLARY
GLUCOSE-CAPILLARY: 133 mg/dL — AB (ref 65–99)
Glucose-Capillary: 300 mg/dL — ABNORMAL HIGH (ref 65–99)
Glucose-Capillary: 249 mg/dL — ABNORMAL HIGH (ref 65–99)
Glucose-Capillary: 265 mg/dL — ABNORMAL HIGH (ref 65–99)
Glucose-Capillary: 76 mg/dL (ref 65–99)

## 2016-08-16 LAB — ECHOCARDIOGRAM COMPLETE
Height: 69 in
Weight: 2940.8 oz

## 2016-08-16 MED ORDER — GLUCERNA SHAKE PO LIQD
237.0000 mL | Freq: Three times a day (TID) | ORAL | Status: DC
  Administered 2016-08-16 – 2016-08-20 (×13): 237 mL via ORAL

## 2016-08-16 MED ORDER — GABAPENTIN 300 MG PO CAPS: 300.0000 mg | ORAL_CAPSULE | Freq: Every day | ORAL | Status: DC | PRN

## 2016-08-16 MED ORDER — CARVEDILOL 25 MG PO TABS
25.0000 mg | ORAL_TABLET | Freq: Two times a day (BID) | ORAL | Status: DC
  Administered 2016-08-16 – 2016-08-20 (×8): 25 mg via ORAL
  Filled 2016-08-16 (×8): qty 1

## 2016-08-16 MED ORDER — VITAMIN C 500 MG PO TABS
500.0000 mg | ORAL_TABLET | Freq: Two times a day (BID) | ORAL | Status: DC
  Administered 2016-08-16 – 2016-08-20 (×9): 500 mg via ORAL
  Filled 2016-08-16 (×9): qty 1

## 2016-08-16 MED ORDER — CLOPIDOGREL BISULFATE 75 MG PO TABS
75.0000 mg | ORAL_TABLET | Freq: Every day | ORAL | Status: DC
  Administered 2016-08-16 – 2016-08-20 (×5): 75 mg via ORAL
  Filled 2016-08-16 (×5): qty 1

## 2016-08-16 MED ORDER — SODIUM CHLORIDE 0.9% FLUSH
10.0000 mL | INTRAVENOUS | Status: DC | PRN
  Administered 2016-08-16: 10 mL
  Filled 2016-08-16: qty 40

## 2016-08-16 MED ORDER — GI COCKTAIL ~~LOC~~
30.0000 mL | Freq: Four times a day (QID) | ORAL | Status: DC | PRN
  Filled 2016-08-16: qty 30

## 2016-08-16 MED ORDER — MELATONIN 5 MG PO TABS
5.0000 mg | ORAL_TABLET | Freq: Every evening | ORAL | Status: DC | PRN
  Administered 2016-08-20: 5 mg via ORAL
  Filled 2016-08-16 (×2): qty 1

## 2016-08-16 MED ORDER — ASPIRIN EC 81 MG PO TBEC
81.0000 mg | DELAYED_RELEASE_TABLET | Freq: Every day | ORAL | Status: DC
  Administered 2016-08-17 – 2016-08-20 (×4): 81 mg via ORAL
  Filled 2016-08-16 (×4): qty 1

## 2016-08-16 MED ORDER — ONDANSETRON HCL 4 MG PO TABS: 4.0000 mg | ORAL_TABLET | Freq: Three times a day (TID) | ORAL | Status: DC | PRN

## 2016-08-16 MED ORDER — COLLAGENASE 250 UNIT/GM EX OINT
TOPICAL_OINTMENT | Freq: Every day | CUTANEOUS | Status: DC
  Administered 2016-08-16 – 2016-08-20 (×5): via TOPICAL
  Filled 2016-08-16: qty 30

## 2016-08-16 MED ORDER — POLYETHYLENE GLYCOL 3350 17 G PO PACK
17.0000 g | PACK | Freq: Every day | ORAL | Status: DC
  Administered 2016-08-16 – 2016-08-20 (×5): 17 g via ORAL
  Filled 2016-08-16 (×5): qty 1

## 2016-08-16 MED ORDER — RISAQUAD PO CAPS
1.0000 | ORAL_CAPSULE | Freq: Every day | ORAL | Status: DC
  Administered 2016-08-16 – 2016-08-20 (×5): 1 via ORAL
  Filled 2016-08-16 (×5): qty 1

## 2016-08-16 MED ORDER — INSULIN GLARGINE 100 UNIT/ML ~~LOC~~ SOLN
10.0000 [IU] | Freq: Every day | SUBCUTANEOUS | Status: DC
  Administered 2016-08-16 – 2016-08-19 (×4): 10 [IU] via SUBCUTANEOUS
  Filled 2016-08-16 (×5): qty 0.1

## 2016-08-16 MED ORDER — SENNOSIDES-DOCUSATE SODIUM 8.6-50 MG PO TABS
1.0000 | ORAL_TABLET | Freq: Every day | ORAL | Status: DC
Start: 2016-08-16 — End: 2016-08-20
  Administered 2016-08-16 – 2016-08-20 (×5): 1 via ORAL
  Filled 2016-08-16 (×5): qty 1

## 2016-08-16 MED ORDER — CARVEDILOL 12.5 MG PO TABS
12.5000 mg | ORAL_TABLET | Freq: Two times a day (BID) | ORAL | Status: DC
  Administered 2016-08-16: 12.5 mg via ORAL
  Filled 2016-08-16: qty 1

## 2016-08-16 MED ORDER — HEPARIN SODIUM (PORCINE) 5000 UNIT/ML IJ SOLN
5000.0000 [IU] | Freq: Three times a day (TID) | INTRAMUSCULAR | Status: DC
  Administered 2016-08-16: 5000 [IU] via SUBCUTANEOUS
  Filled 2016-08-16: qty 1

## 2016-08-16 MED ORDER — NITROGLYCERIN 0.4 MG SL SUBL: 0.4000 mg | SUBLINGUAL_TABLET | SUBLINGUAL | Status: DC | PRN

## 2016-08-16 MED ORDER — SODIUM CHLORIDE 0.9% FLUSH
10.0000 mL | Freq: Two times a day (BID) | INTRAVENOUS | Status: DC
  Administered 2016-08-16 (×2): 10 mL
  Administered 2016-08-17: 20 mL
  Administered 2016-08-18: 10 mL
  Administered 2016-08-18: 30 mL
  Administered 2016-08-19: 20 mL
  Administered 2016-08-19 – 2016-08-20 (×2): 10 mL

## 2016-08-16 MED ORDER — MORPHINE SULFATE (PF) 2 MG/ML IV SOLN: 2.0000 mg | INTRAVENOUS | Status: DC | PRN

## 2016-08-16 MED ORDER — ALPRAZOLAM 0.25 MG PO TABS: 0.2500 mg | ORAL_TABLET | Freq: Two times a day (BID) | ORAL | Status: DC | PRN

## 2016-08-16 MED ORDER — PRO-STAT SUGAR FREE PO LIQD
30.0000 mL | Freq: Three times a day (TID) | ORAL | Status: DC
  Administered 2016-08-16 – 2016-08-20 (×13): 30 mL via ORAL

## 2016-08-16 MED ORDER — INSULIN ASPART 100 UNIT/ML ~~LOC~~ SOLN
0.0000 [IU] | Freq: Three times a day (TID) | SUBCUTANEOUS | Status: DC
  Administered 2016-08-16: 2 [IU] via SUBCUTANEOUS
  Administered 2016-08-16: 8 [IU] via SUBCUTANEOUS
  Administered 2016-08-18 – 2016-08-19 (×3): 3 [IU] via SUBCUTANEOUS
  Administered 2016-08-19: 8 [IU] via SUBCUTANEOUS
  Administered 2016-08-19 – 2016-08-20 (×3): 3 [IU] via SUBCUTANEOUS
  Filled 2016-08-16 (×6): qty 3
  Filled 2016-08-16: qty 8
  Filled 2016-08-16: qty 2
  Filled 2016-08-16: qty 8

## 2016-08-16 MED ORDER — ADULT MULTIVITAMIN W/MINERALS CH
1.0000 | ORAL_TABLET | Freq: Every day | ORAL | Status: DC
  Administered 2016-08-16 – 2016-08-20 (×5): 1 via ORAL
  Filled 2016-08-16 (×5): qty 1

## 2016-08-16 MED ORDER — ENOXAPARIN SODIUM 40 MG/0.4ML ~~LOC~~ SOLN
40.0000 mg | SUBCUTANEOUS | Status: DC
  Administered 2016-08-16 – 2016-08-20 (×5): 40 mg via SUBCUTANEOUS
  Filled 2016-08-16 (×5): qty 0.4

## 2016-08-16 MED ORDER — INSULIN GLARGINE 100 UNIT/ML ~~LOC~~ SOLN
10.0000 [IU] | Freq: Every day | SUBCUTANEOUS | Status: DC
  Administered 2016-08-16: 10 [IU] via SUBCUTANEOUS
  Filled 2016-08-16 (×2): qty 0.1

## 2016-08-16 MED ORDER — IPRATROPIUM-ALBUTEROL 0.5-2.5 (3) MG/3ML IN SOLN
3.0000 mL | Freq: Four times a day (QID) | RESPIRATORY_TRACT | Status: DC | PRN
  Administered 2016-08-16 – 2016-08-17 (×2): 3 mL via RESPIRATORY_TRACT
  Filled 2016-08-16: qty 3

## 2016-08-16 MED ORDER — ATORVASTATIN CALCIUM 20 MG PO TABS
80.0000 mg | ORAL_TABLET | Freq: Every day | ORAL | Status: DC
  Administered 2016-08-16 – 2016-08-19 (×4): 80 mg via ORAL
  Filled 2016-08-16 (×4): qty 4

## 2016-08-16 MED ORDER — SODIUM CHLORIDE 0.9 % IV SOLN: Freq: Once | INTRAVENOUS | Status: DC

## 2016-08-16 MED ORDER — SODIUM CHLORIDE 0.9 % IV SOLN
3.0000 g | Freq: Four times a day (QID) | INTRAVENOUS | Status: DC
  Administered 2016-08-16 – 2016-08-20 (×18): 3 g via INTRAVENOUS
  Filled 2016-08-16 (×21): qty 3

## 2016-08-16 MED ORDER — ACETAMINOPHEN 325 MG PO TABS: 650.0000 mg | ORAL_TABLET | ORAL | Status: DC | PRN

## 2016-08-16 MED ORDER — INSULIN GLARGINE 100 UNIT/ML ~~LOC~~ SOLN
20.0000 [IU] | Freq: Every day | SUBCUTANEOUS | Status: DC
  Filled 2016-08-16: qty 0.2

## 2016-08-16 MED ORDER — FERROUS SULFATE 325 (65 FE) MG PO TABS
325.0000 mg | ORAL_TABLET | Freq: Three times a day (TID) | ORAL | Status: DC
  Administered 2016-08-16 – 2016-08-20 (×14): 325 mg via ORAL
  Filled 2016-08-16 (×14): qty 1

## 2016-08-16 MED ORDER — ONDANSETRON HCL 4 MG/2ML IJ SOLN
4.0000 mg | Freq: Four times a day (QID) | INTRAMUSCULAR | Status: DC | PRN
  Administered 2016-08-16 (×2): 4 mg via INTRAVENOUS
  Filled 2016-08-16 (×2): qty 2

## 2016-08-16 MED ORDER — ZOLPIDEM TARTRATE 5 MG PO TABS
5.0000 mg | ORAL_TABLET | Freq: Every evening | ORAL | Status: DC | PRN
  Administered 2016-08-16 – 2016-08-19 (×4): 5 mg via ORAL
  Filled 2016-08-16 (×4): qty 1

## 2016-08-16 MED ORDER — HYDROCODONE-ACETAMINOPHEN 5-325 MG PO TABS: 1.0000 | ORAL_TABLET | Freq: Four times a day (QID) | ORAL | Status: DC | PRN

## 2016-08-16 MED ORDER — FUROSEMIDE 20 MG PO TABS
20.0000 mg | ORAL_TABLET | Freq: Every day | ORAL | Status: DC
  Administered 2016-08-16: 20 mg via ORAL
  Filled 2016-08-16: qty 1

## 2016-08-16 MED ORDER — PANTOPRAZOLE SODIUM 40 MG PO TBEC
40.0000 mg | DELAYED_RELEASE_TABLET | Freq: Every day | ORAL | Status: DC
  Administered 2016-08-16 – 2016-08-20 (×5): 40 mg via ORAL
  Filled 2016-08-16 (×5): qty 1

## 2016-08-16 MED ORDER — HYDRALAZINE HCL 25 MG PO TABS
25.0000 mg | ORAL_TABLET | Freq: Three times a day (TID) | ORAL | Status: DC
  Administered 2016-08-16 – 2016-08-20 (×13): 25 mg via ORAL
  Filled 2016-08-16 (×13): qty 1

## 2016-08-16 MED ORDER — ASPIRIN EC 325 MG PO TBEC
325.0000 mg | DELAYED_RELEASE_TABLET | Freq: Every day | ORAL | Status: DC
  Administered 2016-08-16: 325 mg via ORAL
  Filled 2016-08-16: qty 1

## 2016-08-16 MED ORDER — TAMSULOSIN HCL 0.4 MG PO CAPS
0.4000 mg | ORAL_CAPSULE | Freq: Every day | ORAL | Status: DC
  Administered 2016-08-16 – 2016-08-20 (×5): 0.4 mg via ORAL
  Filled 2016-08-16 (×5): qty 1

## 2016-08-16 NOTE — Progress Notes (Signed)
DR Allena KatzPatel was informed about pt trop. level 0.03 no new order at this time .

## 2016-08-16 NOTE — Progress Notes (Signed)
[facility] - [place] at [facility]   PATIENT NAME: Damon [name]    MR#:  [id number]  DATE OF BIRTH:  09-Apr-1951  SUBJECTIVE:  Came in with chest pressure and sob. Feels ok   REVIEW OF SYSTEMS:   Review of Systems  Constitutional: Negative for chills, fever and weight loss.  HENT: Negative for ear discharge, ear pain and nosebleeds.   Eyes: Negative for blurred vision, pain and discharge.  Respiratory: Negative for sputum production, shortness of breath, wheezing and stridor.   Cardiovascular: Negative for chest pain, palpitations, orthopnea and PND.  Gastrointestinal: Negative for abdominal pain, diarrhea, nausea and vomiting.  Genitourinary: Negative for frequency and urgency.  Musculoskeletal: Negative for back pain and joint pain.  Skin:       Chronic decubitus  Neurological: Positive for weakness. Negative for sensory change, speech change and focal weakness.  Psychiatric/Behavioral: Negative for depression and hallucinations. The patient is not nervous/anxious.    Tolerating Diet:yes Tolerating PT: bed bound  DRUG ALLERGIES:  No Known Allergies  VITALS:  Blood pressure (!) 148/61, pulse 80, temperature 97.9 F (36.6 C), temperature source Oral, resp. rate 16, height 5\' 9"  (1.753 m), weight 83.4 kg (183 lb 12.8 oz), SpO2 100 %.  PHYSICAL EXAMINATION:   Physical Exam  GENERAL:  66 y.o.-year-old patient lying in the bed with no acute distress. Chronically ill EYES: Pupils equal, round, reactive to light and accommodation. No scleral icterus. Extraocular muscles intact.  HEENT: Head atraumatic, normocephalic. Oropharynx and nasopharynx clear.  NECK:  Supple, no jugular venous distention. No thyroid enlargement, no tenderness.  LUNGS: Normal breath sounds bilaterally, no wheezing, rales, rhonchi. No use of accessory muscles of respiration.  CARDIOVASCULAR: S1, S2 normal. No murmurs, rubs, or gallops.  ABDOMEN: Soft, nontender, nondistended. Bowel  sounds present. No organomegaly or mass.  EXTREMITIES: No cyanosis, clubbing or edema b/l.   Right BKA. Left in protective boot NEUROLOGIC: Cranial nerves II through XII are intact. No focal Motor or sensory deficits b/l.   PSYCHIATRIC:  patient is alert and oriented x 3.  SKIN:chronic decubitus on admission  LABORATORY PANEL:  CBC  Recent Labs Lab 08/16/16 0221  WBC 9.3  HGB 7.5*  HCT 22.4*  PLT 271    Chemistries   Recent Labs Lab 08/15/16 2201 08/16/16 0221  NA 139  --   K 4.2  --   CL 109  --   CO2 26  --   GLUCOSE 339*  --   BUN 30*  --   CREATININE 1.73* 1.73*  CALCIUM 8.1*  --    Cardiac Enzymes  Recent Labs Lab 08/16/16 0221  TROPONINI <0.03   RADIOLOGY:  Dg Chest 2 View  Result Date: 08/15/2016 CLINICAL DATA:  Chest pain and shortness of breath. EXAM: CHEST  2 VIEW COMPARISON:  AP radiograph 07/17/2016 FINDINGS: Tip of the right upper extremity PICC in the mid SVC. Small bilateral pleural effusions. Bibasilar opacities, left greater than right. There is volume loss in the right lung with elevated right hemidiaphragm. Unchanged heart size and mediastinal contours with atherosclerosis of the thoracic aorta. No pneumothorax. No pulmonary edema. Stable osseous structures. IMPRESSION: Small bilateral pleural effusions and bibasilar airspace disease. This may be atelectasis, pneumonia, or aspiration. Mild volume loss in the right lung with elevation of right hemidiaphragm. Thoracic aortic atherosclerosis. Electronically Signed   By: Rubye OaksMelanie  Ehinger M.D.   On: 08/15/2016 22:42   ASSESSMENT AND PLAN:  66 y.o. male with a history of CAD  status post STEMI and an STEMI, CHF and ischemic cardiomyopathy, HTN, DM now being admitted with:  1. Chest pain, rule out ACS - negative troponins so far - Morphine, nitro, beta blocker, aspirin and statin ordered.   - Cardiology consult awaited  2. H/O CAD s/p STEMI, NSTEMI - Continue Plavix, Lipitor  3. H/O CHF - Continue  Lasix  4. H/O HTN - Continue Coreg, hydralazine  5. H/O CKD, stable - Monitor BMP  6. H/o Diabetes - Accuchecks achs with RISS coverage - Heart healthy, carb controlled diet - Continue Lantus  7. Anemia, chronic - Monitor CBC - Continue iron  8. History of GERD - Protonix  9. Chronic decubitus -cont IV unasyn thru 08/23/16 -Wound consult placed  Case discussed with Care Management/Social Worker. Management plans discussed with the patient, family and they are in agreement.  CODE STATUS: Full  DVT Prophylaxis: lovenox  TOTAL TIME TAKING CARE OF THIS PATIENT: *30* minutes.  >50% time spent on counselling and coordination of care  POSSIBLE D/C IN 1 DAYS, DEPENDING ON CLINICAL CONDITION.  Note: This dictation was prepared with Dragon dictation along with smaller phrase technology. Any transcriptional errors that result from this process are unintentional.  Jair Lindblad M.D on 08/16/2016 at 8:14 AM  Between 7am to 6pm - Pager - [phone]  After 6pm go to [email or web address] - Social research officer, government  [facility] [place] [facility]  Office  [phone]  CC: Primary care physician; Emogene Morgan, MD

## 2016-08-16 NOTE — Progress Notes (Signed)
*  PRELIMINARY RESULTS* Echocardiogram 2D Echocardiogram has been performed.  Cristela BlueHege, Doss Cybulski 08/16/2016, 2:36 PM

## 2016-08-16 NOTE — Progress Notes (Addendum)
DR Allena KatzPatel was informed about pt having 5 beat of v -tach and also pt stated that he can't breath with sat 99%, see order

## 2016-08-16 NOTE — ED Notes (Signed)
Report to Serina CowperALisa, RN 2A. Encouraged to call with any questions.

## 2016-08-16 NOTE — Progress Notes (Signed)
Initial Nutrition Assessment  DOCUMENTATION CODES:   Not applicable  INTERVENTION:  1. Continue regimen from MD:  Pro-stat 30mL TID - each supplement provides 15 grams protein and 100 calories Glucerna Shake po TID, each supplement provides 220 kcal and 10 grams of protein  2. MVI w/ Minerals Continue vitamin C 500mg  PO BID  NUTRITION DIAGNOSIS:   Increased nutrient needs related to wound healing as evidenced by estimated needs.  GOAL:   Patient will meet greater than or equal to 90% of their needs  MONITOR:   PO intake, I & O's, Supplement acceptance, Labs, Weight trends  REASON FOR ASSESSMENT:   Low Braden    ASSESSMENT:   Damon [name] is a 66 y.o. male with a known history of CAD status post STEMI and an STEMI, CHF and ischemic cardiomyopathy, HTN, DM presents to the emergency department for evaluation of chest pain.  Damon [name] is well known to our service Seems to be doing better, in better spirits PO intake and weight has improved dramatically, but he does have a Stg III to his sacrum now Ate 100% of his meal this morning, Grits, eggs, Tomasa BlaseBacon, Toast - but stated he didn't want to eat anymore due to SOB. Requesting O2 Eating well at his facility - now consuming 3 meals per day. Nutrition-Focused physical exam completed. Findings are no fat depletion, no muscle depletion, and no edema.   Labs and medications reviewed: Miralax/glycolax, Senokot-S, Iron  Diet Order:  Diet NPO time specified Diet Carb Modified Fluid consistency: Thin; Room service appropriate? Yes  Skin:  Wound (see comment) (Stg 3 to sacrum, purple area to heel)  Last BM:  08/16/2016  Height:   Ht Readings from Last 1 Encounters:  08/16/16 5\' 9"  (1.753 m)    Weight:   Wt Readings from Last 1 Encounters:  08/16/16 183 lb 12.8 oz (83.4 kg)    Ideal Body Weight:  68.4 kg  BMI:  Body mass index is 27.14 kg/m.  Estimated Nutritional Needs:   Kcal:  2100-2500 calories  Protein:  100-124  gm (1.2-1.5 gm/kg)  Fluid:  >/= 2.1L  EDUCATION NEEDS:   No education needs identified at this time  Dionne AnoWilliam M. Jamorian Dimaria, MS, RD LDN Inpatient Clinical Dietitian Pager [phone]

## 2016-08-16 NOTE — Progress Notes (Signed)
Inpatient Diabetes Program Recommendations  AACE/ADA: New Consensus Statement on Inpatient Glycemic Control (2015)  Target Ranges:  Prepandial:   less than 140 mg/dL      Peak postprandial:   less than 180 mg/dL (1-2 hours)      Critically ill patients:  140 - 180 mg/dL   Lab Results  Component Value Date   GLUCAP 249 (H) 08/16/2016   HGBA1C 7.8 (H) 07/08/2016    Review of Glycemic Control  Results for Damon [name], [name] (MRN [id number]) as of 08/16/2016 10:16  Ref. Range 08/16/2016 05:29 08/16/2016 09:32  Glucose-Capillary Latest Ref Range: 65 - 99 mg/dL 409300 (H) 811249 (H)    Diabetes history: Type 2 Outpatient Diabetes medications: Lantus 10 units qhs Current orders for Inpatient glycemic control: Lantus 20 units qhs, Novolog 0-15 units tid  Inpatient Diabetes Program Recommendations:  Since patient is going to be NPO tonight (based on written order), I am concerned about doubling his Lantus. A1C 7.8%  Consider adding Novolog 0-5 units qhs to current orders. Consider leaving Lantus at 10 units qhs.   Text paged Dr. Erskine SquibbPatel   Onda Kattner, RN, BA, MHA, CDE Diabetes Coordinator Inpatient Diabetes Program  [phone] (Team Pager) [phone] [facility] Office) 08/16/2016 10:26 AM

## 2016-08-16 NOTE — Progress Notes (Signed)
[facility] [place] [facility]   PATIENT NAME: Damon [name]    MR#:  [id number]  DATE OF BIRTH:  09-Apr-1951  SUBJECTIVE:  Came in with chest pressure and sob. Feels ok   REVIEW OF SYSTEMS:   Review of Systems  Constitutional: Negative for chills, fever and weight loss.  HENT: Negative for ear discharge, ear pain and nosebleeds.   Eyes: Negative for blurred vision, pain and discharge.  Respiratory: Negative for sputum production, shortness of breath, wheezing and stridor.   Cardiovascular: Negative for chest pain, palpitations, orthopnea and PND.  Gastrointestinal: Negative for abdominal pain, diarrhea, nausea and vomiting.  Genitourinary: Negative for frequency and urgency.  Musculoskeletal: Negative for back pain and joint pain.  Skin:       Chronic decubitus  Neurological: Positive for weakness. Negative for sensory change, speech change and focal weakness.  Psychiatric/Behavioral: Negative for depression and hallucinations. The patient is not nervous/anxious.    Tolerating Diet:yes Tolerating PT: bed bound  DRUG ALLERGIES:  No Known Allergies  VITALS:  Blood pressure (!) 148/61, pulse 80, temperature 97.9 F (36.6 C), temperature source Oral, resp. rate 16, height 5\' 9"  (1.753 m), weight 83.4 kg (183 lb 12.8 oz), SpO2 100 %.  PHYSICAL EXAMINATION:   Physical Exam  GENERAL:  66 y.o.-year-old patient lying in the bed with no acute distress. Chronically ill EYES: Pupils equal, round, reactive to light and accommodation. No scleral icterus. Extraocular muscles intact.  HEENT: Head atraumatic, normocephalic. Oropharynx and nasopharynx clear.  NECK:  Supple, no jugular venous distention. No thyroid enlargement, no tenderness.  LUNGS: Normal breath sounds bilaterally, no wheezing, rales, rhonchi. No use of accessory muscles of respiration.  CARDIOVASCULAR: S1, S2 normal. No murmurs, rubs, or gallops.  ABDOMEN: Soft, nontender, nondistended. Bowel  sounds present. No organomegaly or mass.  EXTREMITIES: No cyanosis, clubbing or edema b/l.   Right BKA. Left in protective boot NEUROLOGIC: Cranial nerves II through XII are intact. No focal Motor or sensory deficits b/l.   PSYCHIATRIC:  patient is alert and oriented x 3.  SKIN:chronic decubitus on admission  LABORATORY PANEL:  CBC  Recent Labs Lab 08/16/16 0221  WBC 9.3  HGB 7.5*  HCT 22.4*  PLT 271    Chemistries   Recent Labs Lab 08/15/16 2201 08/16/16 0221  NA 139  --   K 4.2  --   CL 109  --   CO2 26  --   GLUCOSE 339*  --   BUN 30*  --   CREATININE 1.73* 1.73*  CALCIUM 8.1*  --    Cardiac Enzymes  Recent Labs Lab 08/16/16 0221  TROPONINI <0.03   RADIOLOGY:  Dg Chest 2 View  Result Date: 08/15/2016 CLINICAL DATA:  Chest pain and shortness of breath. EXAM: CHEST  2 VIEW COMPARISON:  AP radiograph 07/17/2016 FINDINGS: Tip of the right upper extremity PICC in the mid SVC. Small bilateral pleural effusions. Bibasilar opacities, left greater than right. There is volume loss in the right lung with elevated right hemidiaphragm. Unchanged heart size and mediastinal contours with atherosclerosis of the thoracic aorta. No pneumothorax. No pulmonary edema. Stable osseous structures. IMPRESSION: Small bilateral pleural effusions and bibasilar airspace disease. This may be atelectasis, pneumonia, or aspiration. Mild volume loss in the right lung with elevation of right hemidiaphragm. Thoracic aortic atherosclerosis. Electronically Signed   By: Rubye OaksMelanie  Ehinger M.D.   On: 08/15/2016 22:42   ASSESSMENT AND PLAN:  66 y.o. male with a history of CAD  status post STEMI and an STEMI, CHF and ischemic cardiomyopathy, HTN, DM now being admitted with:  1. Chest pain, rule out ACS - negative troponins so far - Morphine, nitro, beta blocker, aspirin and statin ordered.   - Cardiology consult awaited  2. H/O CAD s/p STEMI, NSTEMI - Continue Plavix, Lipitor  3. H/O CHF - Continue  Lasix  4. H/O HTN - Continue Coreg, hydralazine  5. H/O CKD, stable - Monitor BMP  6. H/o Diabetes - Accuchecks achs with RISS coverage - Heart healthy, carb controlled diet - Continue Lantus  7. Anemia, chronic - Continue iron -hgb 8.3---7.5 -transfuse one unit today to see if it also helps with symptom management  8. History of GERD - Protonix  9. Chronic decubitus -cont IV unasyn thru 08/23/16 -Wound consult placed  Case discussed with Care Management/Social Worker. Management plans discussed with the patient, family and they are in agreement.  CODE STATUS: Full  DVT Prophylaxis: lovenox  TOTAL TIME TAKING CARE OF THIS PATIENT: *30* minutes.  >50% time spent on counselling and coordination of care  POSSIBLE D/C IN 1 DAYS, DEPENDING ON CLINICAL CONDITION.  Note: This dictation was prepared with Dragon dictation along with smaller phrase technology. Any transcriptional errors that result from this process are unintentional.  Travarius Lange M.D on 08/16/2016 at 8:23 AM  Between 7am to 6pm - Pager - [phone]  After 6pm go to [email or web address] - Social research officer, governmentpassword [email or web address]  [facility]  Office  [phone]  CC: Primary care physician; Emogene MorganAycock, [name], MD

## 2016-08-16 NOTE — Consult Note (Signed)
Cardiology Consultation Note  Patient ID: Damon BornJoel L Kimmey, MRN: [id number], DOB/AGE: [birth year] 66 y.o. Admit date: 08/15/2016   Date of Consult: 08/16/2016 Primary Physician: Emogene MorganAycock, [name], MD Primary Cardiologist: Dr. Kirke CorinArida, MD Requesting Physician: Dr. Emmit PomfretHugelmeyer, DO  Chief Complaint: Chest pain Reason for Consult: Same  HPI: Damon [name] is a 66 y.o. male who is being seen today for the evaluation of chest pain at the request of Dr. Emmit PomfretHugelmeyer, DO. Patient has a h/o CAD s/p subacute anterior wall MI in the setting of a new LBBB and progressive dyspnea requiring intubation in early October 2017. 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   Presented to the hospital in 01/2016 with MI/new LBBB with catheterization revealing severe LAD and obtuse marginal disease, requiring drug-eluting stent placement. EF at that time was 25-30%. Follow-up echo in November showed an EF of 30-35%. He has had several admissions since his MI including 03/2016 for AMS due to hyponatremia, ED visit in 04/2016 for hypoglycemia, admission 04/2016 for LE ulcer, admission 05/2016 for atypical chest pain, successful right BKA in 05/2016, admission in 06/2016 for sepsis in the setting of gluteal abscess requiring I&D.   Patient has been staying at a nursing home since his BKA. While he was being moved in his bed there on 5/9 he developed diffuse chest pain across his anterior chest. No radiation to his neck, shoulder or arm. There was some associated SOB. Pain did not feel like his prior MI. Pain lasted approximately 4 hours and resolved when he was given  supplemental oxygen therapy. He is uncertain if deep inspiration or palpation made the pain worse. No associated diaphoresis, palpitations, dizziness, presyncope, or syncope.  Upon his arrival he was noted to have BP 165/105, HR 80 bpm, afebrile, oxygen saturations 98% on room air. Labs showed troponin <0.03 x 2-->0.03, hgb 8.3-->7.5, wbc 10.3, plt 302, K+ 4.2, SCr 1.73, EKG NSR, 82 bpm, left axis deviation, prior anterior infarct, nonspecific inferolateral st/t changes, CXR with small bilateral pleural effusions. IM has planned for pRBC transfusion today. Echo is pending. Currently without chest pain.    Past Medical History:  Diagnosis Date  . CAD (coronary artery disease)    a. 01/2016 MI/PCI: LM nl, LAD 1758m/d (3.25 x 28 Xience DES), 40d, LCX 30ost, OM1 95 (staged - 2.75 x 18 Xience Alpine DES), OM2/3 min irregs, RCA 2269m.  . Chronic combined systolic and diastolic CHF (congestive heart failure) (HCC)    a. 01/2016 Echo: Ef 25-30%, Gr1 DD, mild AI, mildly dil LA;  b. 02/2016 Echo: EF 30-35%, apical AK, antsept and ant HK, nl RV fxn.  . CKD (chronic kidney disease), stage III   . COPD (chronic obstructive pulmonary disease) (HCC)   . Diabetes mellitus without complication (HCC)   . Diverticulitis   . Essential hypertension   . Ischemic cardiomyopathy    a. 01/2016 Echo: Ef 25-30%;  b. 02/2016 Echo: EF 30-35%.  Marland Kitchen. LBBB (left bundle branch block)   . PAD (peripheral artery disease) (HCC)    a. 10/2015 s/p PTA and stenting of the RCIA, LCIA, and PTA of the  LEIA (Schnier); b. 04/2016 PTA or R tibioperoneal trunk & prox PT, PTA/DBA of R Pop and SFA, Viabahn covered stent x 2 to the R SFA and pop (5mm x 25 cm & 5mm x 15cm).  . Tobacco abuse       Most Recent Cardiac Studies: LHC 01/2016: Conclusion     Mid RCA lesion, 80 %stenosed.  Ost Cx lesion, 30 %stenosed.  Mid LAD to Dist LAD lesion, 0 %stenosed.  A drug eluting .  Dist LAD lesion, 40 %stenosed.  A STENT XIENCE ALPINE RX  [id number] drug eluting stent was successfully placed.  Ost 1st Mrg to 1st Mrg lesion, 95 %stenosed.  Post intervention, there is a 0% residual stenosis.   Successful angioplasty and drug-eluting stent placement to large OM 1 (could be considered mid left circumflex).   Recommendations: Dual antiplatelet therapy for at least one year. Aggressive treatment for risk factors.   TTE 02/2016: Study Conclusions  - Left ventricle: The cavity size was normal. There was moderate to   severe concentric hypertrophy with very small LV cavity during   systole. Systolic function was moderately to severely reduced.   The estimated ejection fraction was in the range of 30% to 35%.   Akinesis of the apical myocardium. Hypokinesis of the   anteroseptal myocardium. Hypokinesis of the mid to distal   anterior myocardium. - Left atrium: The atrium was normal in size. - Right ventricle: Systolic function was normal. - Pulmonary arteries: Systolic pressure was within the normal   range.   Surgical History:  Past Surgical History:  Procedure Laterality Date  . AMPUTATION Right 06/06/2016   Procedure: AMPUTATION BELOW KNEE;  Surgeon: Annice Needy, MD;  Location: [facility] ORS;  Service: General;  Laterality: Right;  . CARDIAC CATHETERIZATION N/A 01/13/2016   Procedure: LEFT HEART CATH AND CORONARY ANGIOGRAPHY;  Surgeon: Iran Ouch, MD;  Location: [facility] INVASIVE CV LAB;  Service: Cardiovascular;  Laterality: N/A;  . CARDIAC CATHETERIZATION N/A 01/13/2016   Procedure: Coronary Stent Intervention;  Surgeon: Iran Ouch, MD;  Location: [facility] INVASIVE CV LAB;  Service: Cardiovascular;  Laterality: N/A;  . CARDIAC CATHETERIZATION N/A 01/16/2016   Procedure: Left Heart Cath and Coronary Angiography;  Surgeon: Iran Ouch, MD;  Location: [facility] INVASIVE CV LAB;  Service: Cardiovascular;  Laterality: N/A;  . INCISION AND DRAINAGE ABSCESS N/A 07/12/2016   Procedure: INCISION AND DRAINAGE GLUTEAL  ABSCESS;   Surgeon: Leafy Ro, MD;  Location: [facility] ORS;  Service: General;  Laterality: N/A;  . IRRIGATION AND DEBRIDEMENT FOOT Right 05/08/2016   Procedure: IRRIGATION AND DEBRIDEMENT FOOT WITH PLACEMENT OF ANTIBIOTIC BEADS;  Surgeon: Linus Galas, DPM;  Location: [facility] ORS;  Service: Podiatry;  Laterality: Right;  . NECK SURGERY    . PERIPHERAL VASCULAR CATHETERIZATION Left 11/01/2015   Procedure: Lower Extremity Angiography;  Surgeon: Renford Dills, MD;  Location: [facility] INVASIVE CV LAB;  Service: Cardiovascular;  Laterality: Left;  . PERIPHERAL VASCULAR CATHETERIZATION Right 05/07/2016   Procedure: Lower Extremity Angiography;  Surgeon: Annice Needy, MD;  Location: [facility] INVASIVE CV LAB;  Service: Cardiovascular;  Laterality: Right;     Home Meds: Prior to Admission medications   Medication Sig Start Date End Date Taking? Authorizing Provider  acetaminophen (TYLENOL) 500 MG tablet Take 500 mg by mouth every 6 (six) hours as needed.   Yes [provider]  acidophilus (RISAQUAD) CAPS capsule Take 1 capsule by mouth daily.   Yes [provider]  albuterol-ipratropium (COMBIVENT) 18-103 MCG/ACT  inhaler Inhale into the lungs every 4 (four) hours as needed for wheezing or shortness of breath.   Yes [provider]  Amino Acids-Protein Hydrolys (FEEDING SUPPLEMENT, PRO-STAT SUGAR FREE 64,) LIQD Take 30 mLs by mouth 3 (three) times daily with meals.   Yes [provider]  Ampicillin-Sulbactam 3 g in sodium chloride 0.9 % 100 mL Inject 3 g into the vein every 12 (twelve) hours. 07/18/16 08/23/16 Yes Adrian Saran, MD  atorvastatin (LIPITOR) 80 MG tablet Take 1 tablet (80 mg total) by mouth daily at 6 PM. 01/17/16  Yes Enid Baas, MD  carvedilol (COREG) 12.5 MG tablet Take 1 tablet (12.5 mg total) by mouth 2 (two) times daily with a meal. 06/11/16  Yes Enedina Finner, MD  clopidogrel (PLAVIX) 75 MG tablet Take 1 tablet (75 mg total) by mouth daily. 06/12/16  Yes Enedina Finner, MD    ferrous sulfate 325 (65 FE) MG tablet Take 1 tablet (325 mg total) by mouth 3 (three) times daily with meals. 05/11/16  Yes Shaune Pollack, MD  furosemide (LASIX) 20 MG tablet Take 20 mg by mouth daily.   Yes [provider]  gabapentin (NEURONTIN) 300 MG capsule Take 300 mg by mouth daily as needed.    Yes [provider]  hydrALAZINE (APRESOLINE) 25 MG tablet Take 1 tablet (25 mg total) by mouth every 8 (eight) hours. 06/11/16  Yes Enedina Finner, MD  HYDROcodone-acetaminophen (NORCO/VICODIN) 5-325 MG tablet Take 1 tablet by mouth every 6 (six) hours as needed for moderate pain. 07/18/16  Yes Mody, Patricia Pesa, MD  insulin glargine (LANTUS) 100 UNIT/ML injection Inject 0.1 mLs (10 Units total) into the skin at bedtime. 07/20/16  Yes Altamese Dilling, MD  Melatonin 5 MG TABS Take 5 mg by mouth at bedtime as needed (sleep).   Yes [provider]  omeprazole (PRILOSEC) 20 MG capsule Take 20 mg by mouth daily.   Yes [provider]  ondansetron (ZOFRAN) 4 MG tablet Take 4 mg by mouth every 8 (eight) hours as needed for nausea or vomiting.   Yes [provider]  polyethylene glycol (MIRALAX / GLYCOLAX) packet Take 17 g by mouth daily.   Yes [provider]  sennosides-docusate sodium (SENOKOT-S) 8.6-50 MG tablet Take 1 tablet by mouth daily.   Yes [provider]  tamsulosin (FLOMAX) 0.4 MG CAPS capsule Take 1 capsule (0.4 mg total) by mouth daily. 06/12/16  Yes Enedina Finner, MD  vitamin C (ASCORBIC ACID) 500 MG tablet Take 500 mg by mouth 2 (two) times daily.   Yes [provider]  feeding supplement, GLUCERNA SHAKE, (GLUCERNA SHAKE) LIQD Take 237 mLs by mouth 3 (three) times daily between meals. 06/11/16   Enedina Finner, MD    Inpatient Medications:  . acidophilus  1 capsule Oral Daily  . aspirin EC  325 mg Oral Daily  . atorvastatin  80 mg Oral q1800  . carvedilol  12.5 mg Oral BID WC  . clopidogrel  75 mg Oral Daily  . enoxaparin (LOVENOX)  injection  40 mg Subcutaneous Q24H  . feeding supplement (GLUCERNA SHAKE)  237 mL Oral TID BM  . feeding supplement (PRO-STAT SUGAR FREE 64)  30 mL Oral TID WC  . ferrous sulfate  325 mg Oral TID WC  . furosemide  20 mg Oral Daily  . hydrALAZINE  25 mg Oral Q8H  . insulin aspart  0-15 Units Subcutaneous TID WC  . insulin glargine  20 Units Subcutaneous QHS  . pantoprazole  40 mg Oral Daily  . polyethylene glycol  17 g Oral Daily  . senna-docusate  1 tablet Oral Daily  . sodium chloride flush  10-40 mL Intracatheter Q12H  . tamsulosin  0.4 mg Oral Daily  . vitamin C  500 mg Oral BID   . ampicillin-sulbactam (UNASYN) IVPB 3 g 3 g (08/16/16 0200)    Allergies: No Known Allergies  Social History   Social History  . Marital status: Married    Spouse name: N/A  . Number of children: N/A  . Years of education: N/A   Occupational History  . Not on file.   Social History Main Topics  . Smoking status: Current Every Day Smoker    Packs/day: 0.50    Years: 15.00    Types: Cigarettes  . Smokeless tobacco: Current User  . Alcohol use No  . Drug use: No  . Sexual activity: Not on file   Other Topics Concern  . Not on file   Social History Narrative   Lives in [place] with his wife.  Does not routinely exercise.     Family History  Problem Relation Age of Onset  . Intracerebral hemorrhage Mother   . Diabetes Mother   . Cancer Mother   . Diabetes Father      Review of Systems: Review of Systems  Constitutional: Positive for malaise/fatigue. Negative for chills, diaphoresis, fever and weight loss.  HENT: Negative for congestion.   Eyes: Negative for discharge and redness.  Respiratory: Negative for cough, hemoptysis, sputum production, shortness of breath and wheezing.   Cardiovascular: Positive for chest pain. Negative for palpitations, orthopnea, claudication, leg swelling and PND.  Gastrointestinal: Negative for abdominal pain, blood in stool, constipation,  diarrhea, heartburn, melena, nausea and vomiting.  Genitourinary: Negative for hematuria.  Musculoskeletal: Negative for falls and myalgias.  Skin: Negative for rash.  Neurological: Positive for weakness. Negative for dizziness, tingling, tremors, sensory change, speech change, focal weakness and loss of consciousness.  Endo/Heme/Allergies: Does not bruise/bleed easily.  Psychiatric/Behavioral: Negative for substance abuse. The patient is not nervous/anxious.   All other systems reviewed and are negative.   Labs:  Recent Labs  08/15/16 2201 08/16/16 0221  TROPONINI <0.03 <0.03   Lab Results  Component Value Date   WBC 9.3 08/16/2016   HGB 7.5 (L) 08/16/2016   HCT 22.4 (L) 08/16/2016   MCV 90.7 08/16/2016   PLT 271 08/16/2016     Recent Labs Lab 08/15/16 2201 08/16/16 0221  NA 139  --   K 4.2  --   CL 109  --   CO2 26  --   BUN 30*  --   CREATININE 1.73* 1.73*  CALCIUM 8.1*  --   GLUCOSE 339*  --    Lab Results  Component Value Date   CHOL 94 02/15/2016   HDL 23 (L) 02/15/2016   LDLCALC 58 02/15/2016   TRIG 64 02/15/2016   No results found for: DDIMER  Radiology/Studies:  Dg Chest 2 View  Result Date: 08/15/2016 IMPRESSION: Small bilateral pleural effusions and bibasilar airspace disease. This may be atelectasis, pneumonia, or aspiration. Mild volume loss in the right lung with elevation of right hemidiaphragm. Thoracic aortic atherosclerosis. Electronically Signed   By: Rubye Oaks M.D.   On: 08/15/2016 22:42    EKG: Interpreted by me showed: NSR, 82 bpm, left axis deviation, prior anterior infarct, nonspecific inferolateral st/t changes   Telemetry: Interpreted by me showed: sinus rhythm  Weights: [employer]   08/16/16  0107  Weight: 183 lb 12.8 oz (83.4 kg)     Physical Exam: Blood pressure (!) 148/61, pulse 80, temperature 97.9 F (36.6 C), temperature source Oral, resp. rate 16, height 5\' 9"  (1.753 m), weight 183 lb 12.8 oz (83.4 kg), SpO2  100 %. Body mass index is 27.14 kg/m. General: Well developed, well nourished, in no acute distress. Head: Normocephalic, atraumatic, sclera non-icteric, no xanthomas, nares are without discharge.  Neck: Negative for carotid bruits. JVD not elevated. Lungs: Clear bilaterally to auscultation without wheezes, rales, or rhonchi. Breathing is unlabored. Heart: RRR with S1 S2. No murmurs, rubs, or gallops appreciated. Palpation does not reproduce patient's pain.  Abdomen: Soft, non-tender, non-distended with normoactive bowel sounds. No hepatomegaly. No rebound/guarding. No obvious abdominal masses. Msk:  Strength and tone appear normal for age. Extremities: No clubbing or cyanosis. No edema. Right BKA noted. Neuro: Alert and oriented X 3. No facial asymmetry. No focal deficit. Moves all extremities spontaneously. Psych:  Responds to questions appropriately with a normal affect.    Assessment and Plan:  Principal Problem:   Chest pain, rule out acute myocardial infarction Active Problems:   Coronary artery disease   Cardiomyopathy, ischemic   Essential hypertension   HLD (hyperlipidemia)   Anemia   Hx of BKA, right (HCC)   PAD (peripheral artery disease) (HCC)   Pressure injury of skin   CAD (coronary artery disease)   CKD (chronic kidney disease), stage III    1. Chest pain with moderate risk of cardiac etiology/elevated troponin: -Currently without chest pain -Troponin minimally elevated at 0.03, likely in the setting of supply demand ischemia 2/2 acute on chronic anemia and accelerated HTN as well as CKD stage III -Echo pending -He would like to proceed with pRBC transfusion first to see how he feels, if he becomes symptomatic again after this he would consider nuclear stress testing -Consider addition of Imdur  2. Acute on chronic anemia of chronic disease: -For pRBC transfusion today -Per IM -Maintain hgb > 8.5  3. CAD as above: -ASA decrease to 81 mg  daily -Plavix -Monitor hgb on DAPT, last PCI 01/2016 as above. Would ideally like to keep on DAPT for at least 12 months -Coreg, Lipitor   4. Accelerated HTN: -Increase Coreg to 25 mg bid -Continue hydralazine  -May need addition of Imdur  5. CKD stage III: -Stable -Monitor   6. PAD: -Followed by vascular   7. HLD: -Statin   8. Chronic systolic CHF: -He does not appear volume overloaded at this time -Coreg and Lasix    Signed, Eula Listen, PA-C [facility] Pager: [phone] 08/16/2016, 8:43 AM

## 2016-08-16 NOTE — Consult Note (Signed)
WOC Nurse wound consult note Reason for Consult: Stage 4 pressure injury to sacrum, present on admission.  Iv antibiotics for sacral wound.  Left heel deep tissue injury, present on admission.  Wound type: chronic nonhealing pressure Pressure Injury POA: Yes Measurement: 15 cm x 10 cm x 4 cm with tunneling, extending 4 cm to sacrum Left heel 4 cm x 4 cm maroon discoloration.  Intact skin will offlaod with Prevalon boots.  Wound bed: sacrum with devitalized tissue present.  Drainage (amount, consistency, odor) moderate serosanguinous  Musty odor.  Periwound: intact Dressing procedure/placement/frequency:Cleanse wound to sacrum with NS and pat gently dry. Apply Santyl ointment to yellow and black, dead tissue.  Fill wound depth, including tunneling with NS moist gauze.  Cover with ABD pad.  Twice daily.  Prevalon boots to heels to offload pressure.  Will not follow at this time.  Please re-consult if needed.  Maple HudsonKaren Jaking Thayer RN BSN CWON Pager [phone]

## 2016-08-17 ENCOUNTER — Observation Stay: Payer: Commercial Managed Care - PPO

## 2016-08-17 DIAGNOSIS — I252 Old myocardial infarction: Secondary | ICD-10-CM | POA: Diagnosis not present

## 2016-08-17 DIAGNOSIS — R778 Other specified abnormalities of plasma proteins: Secondary | ICD-10-CM | POA: Diagnosis present

## 2016-08-17 DIAGNOSIS — E1151 Type 2 diabetes mellitus with diabetic peripheral angiopathy without gangrene: Secondary | ICD-10-CM | POA: Diagnosis present

## 2016-08-17 DIAGNOSIS — Z79899 Other long term (current) drug therapy: Secondary | ICD-10-CM | POA: Diagnosis not present

## 2016-08-17 DIAGNOSIS — Z95820 Peripheral vascular angioplasty status with implants and grafts: Secondary | ICD-10-CM | POA: Diagnosis not present

## 2016-08-17 DIAGNOSIS — I509 Heart failure, unspecified: Secondary | ICD-10-CM

## 2016-08-17 DIAGNOSIS — E785 Hyperlipidemia, unspecified: Secondary | ICD-10-CM | POA: Diagnosis present

## 2016-08-17 DIAGNOSIS — J449 Chronic obstructive pulmonary disease, unspecified: Secondary | ICD-10-CM | POA: Diagnosis present

## 2016-08-17 DIAGNOSIS — F17210 Nicotine dependence, cigarettes, uncomplicated: Secondary | ICD-10-CM | POA: Diagnosis present

## 2016-08-17 DIAGNOSIS — K219 Gastro-esophageal reflux disease without esophagitis: Secondary | ICD-10-CM | POA: Diagnosis present

## 2016-08-17 DIAGNOSIS — D638 Anemia in other chronic diseases classified elsewhere: Secondary | ICD-10-CM | POA: Diagnosis present

## 2016-08-17 DIAGNOSIS — Z7401 Bed confinement status: Secondary | ICD-10-CM | POA: Diagnosis not present

## 2016-08-17 DIAGNOSIS — R0602 Shortness of breath: Secondary | ICD-10-CM

## 2016-08-17 DIAGNOSIS — Z955 Presence of coronary angioplasty implant and graft: Secondary | ICD-10-CM | POA: Diagnosis not present

## 2016-08-17 DIAGNOSIS — R079 Chest pain, unspecified: Secondary | ICD-10-CM | POA: Diagnosis not present

## 2016-08-17 DIAGNOSIS — Z89511 Acquired absence of right leg below knee: Secondary | ICD-10-CM | POA: Diagnosis not present

## 2016-08-17 DIAGNOSIS — I5043 Acute on chronic combined systolic (congestive) and diastolic (congestive) heart failure: Secondary | ICD-10-CM | POA: Diagnosis present

## 2016-08-17 DIAGNOSIS — I5033 Acute on chronic diastolic (congestive) heart failure: Secondary | ICD-10-CM | POA: Diagnosis not present

## 2016-08-17 DIAGNOSIS — E1122 Type 2 diabetes mellitus with diabetic chronic kidney disease: Secondary | ICD-10-CM | POA: Diagnosis present

## 2016-08-17 DIAGNOSIS — Z794 Long term (current) use of insulin: Secondary | ICD-10-CM | POA: Diagnosis not present

## 2016-08-17 DIAGNOSIS — I25118 Atherosclerotic heart disease of native coronary artery with other forms of angina pectoris: Secondary | ICD-10-CM | POA: Diagnosis not present

## 2016-08-17 DIAGNOSIS — I255 Ischemic cardiomyopathy: Secondary | ICD-10-CM | POA: Diagnosis present

## 2016-08-17 DIAGNOSIS — N5089 Other specified disorders of the male genital organs: Secondary | ICD-10-CM | POA: Diagnosis present

## 2016-08-17 DIAGNOSIS — I130 Hypertensive heart and chronic kidney disease with heart failure and stage 1 through stage 4 chronic kidney disease, or unspecified chronic kidney disease: Secondary | ICD-10-CM | POA: Diagnosis present

## 2016-08-17 DIAGNOSIS — I248 Other forms of acute ischemic heart disease: Secondary | ICD-10-CM | POA: Diagnosis present

## 2016-08-17 DIAGNOSIS — N183 Chronic kidney disease, stage 3 (moderate): Secondary | ICD-10-CM | POA: Diagnosis present

## 2016-08-17 DIAGNOSIS — I2510 Atherosclerotic heart disease of native coronary artery without angina pectoris: Secondary | ICD-10-CM | POA: Diagnosis present

## 2016-08-17 DIAGNOSIS — L89154 Pressure ulcer of sacral region, stage 4: Secondary | ICD-10-CM | POA: Diagnosis present

## 2016-08-17 DIAGNOSIS — Z7902 Long term (current) use of antithrombotics/antiplatelets: Secondary | ICD-10-CM | POA: Diagnosis not present

## 2016-08-17 LAB — TYPE AND SCREEN
ABO/RH(D): O POS
Antibody Screen: NEGATIVE
Unit division: 0

## 2016-08-17 LAB — GLUCOSE, CAPILLARY
GLUCOSE-CAPILLARY: 101 mg/dL — AB (ref 65–99)
GLUCOSE-CAPILLARY: 115 mg/dL — AB (ref 65–99)
GLUCOSE-CAPILLARY: 65 mg/dL (ref 65–99)
GLUCOSE-CAPILLARY: 74 mg/dL (ref 65–99)
Glucose-Capillary: 45 mg/dL — ABNORMAL LOW (ref 65–99)
Glucose-Capillary: 185 mg/dL — ABNORMAL HIGH (ref 65–99)

## 2016-08-17 LAB — BPAM RBC
BLOOD PRODUCT EXPIRATION DATE: 201805152359
ISSUE DATE / TIME: 201805101321
UNIT TYPE AND RH: 5100

## 2016-08-17 LAB — HEMOGLOBIN: Hemoglobin: 8.4 g/dL — ABNORMAL LOW (ref 13.0–18.0)

## 2016-08-17 MED ORDER — FUROSEMIDE 10 MG/ML IJ SOLN
80.0000 mg | Freq: Two times a day (BID) | INTRAMUSCULAR | Status: DC
  Administered 2016-08-17 – 2016-08-18 (×3): 80 mg via INTRAVENOUS
  Filled 2016-08-17 (×3): qty 8

## 2016-08-17 MED ORDER — DEXTROSE 50 % IV SOLN
INTRAVENOUS | Status: AC
  Administered 2016-08-17: 25 mL
  Filled 2016-08-17: qty 50

## 2016-08-17 MED ORDER — DEXTROSE 50 % IV SOLN
25.0000 mL | INTRAVENOUS | Status: AC
  Administered 2016-08-17: 25 mL via INTRAVENOUS

## 2016-08-17 MED ORDER — FUROSEMIDE 10 MG/ML IJ SOLN
20.0000 mg | Freq: Once | INTRAMUSCULAR | Status: AC
  Administered 2016-08-17: 20 mg via INTRAVENOUS
  Filled 2016-08-17: qty 2

## 2016-08-17 NOTE — Progress Notes (Signed)
Inpatient Diabetes Program Recommendations  AACE/ADA: New Consensus Statement on Inpatient Glycemic Control (2015)  Target Ranges:  Prepandial:   less than 140 mg/dL      Peak postprandial:   less than 180 mg/dL (1-2 hours)      Critically ill patients:  140 - 180 mg/dL   Lab Results  Component Value Date   GLUCAP 45 (L) 08/17/2016   HGBA1C 7.8 (H) 07/08/2016    Review of Glycemic Control  Results for Damon [name], [name] (MRN [id number]) as of 08/17/2016 08:17  Ref. Range 08/16/2016 11:58 08/16/2016 16:53 08/16/2016 21:11 08/17/2016 00:38 08/17/2016 07:54  Glucose-Capillary Latest Ref Range: 65 - 99 mg/dL 409265 (H) 811133 (H) 76 74 45 (L)     Diabetes history: Type 2 Outpatient Diabetes medications: Lantus 10 units qhs Current orders for Inpatient glycemic control: Lantus 20 units qhs, Novolog 0-15 units tid  Inpatient Diabetes Program Recommendations:  Since patient is going to be NPO tonight (based on written order), I am concerned about doubling his Lantus. A1C 7.8%  Consider decreasing Novolog correction scale to sensitive and adding Novolog 0-5 units qhs. Decrease Lantus to 8 units qhs.   Susette RacerJulie Falisha Osment, RN, BA, MHA, CDE Diabetes Coordinator Inpatient Diabetes Program  [phone] (Team Pager) [phone] [facility] Office) 08/17/2016 8:19 AM

## 2016-08-17 NOTE — Progress Notes (Signed)
[facility] [place] [facility]   PATIENT NAME: Damon LikesJoel Ley    MR#:  [id number]  DATE OF BIRTH:  04-08-1951  SUBJECTIVE:  Came in with chest pressure and sob. Feels ok Complaints of scrotal edema  REVIEW OF SYSTEMS:   Review of Systems  Constitutional: Negative for chills, fever and weight loss.  HENT: Negative for ear discharge, ear pain and nosebleeds.   Eyes: Negative for blurred vision, pain and discharge.  Respiratory: Negative for sputum production, shortness of breath, wheezing and stridor.   Cardiovascular: Negative for chest pain, palpitations, orthopnea and PND.  Gastrointestinal: Negative for abdominal pain, diarrhea, nausea and vomiting.  Genitourinary: Negative for frequency and urgency.  Musculoskeletal: Negative for back pain and joint pain.  Skin:       Chronic decubitus  Neurological: Positive for weakness. Negative for sensory change, speech change and focal weakness.  Psychiatric/Behavioral: Negative for depression and hallucinations. The patient is not nervous/anxious.    Tolerating Diet:yes Tolerating PT: bed bound  DRUG ALLERGIES:  No Known Allergies  VITALS:  Blood pressure (!) 151/63, pulse 75, temperature 97.6 F (36.4 C), temperature source Oral, resp. rate 16, height 5\' 9"  (1.753 m), weight 83.4 kg (183 lb 12.8 oz), SpO2 100 %.  PHYSICAL EXAMINATION:   Physical Exam  GENERAL:  66 y.o.-year-old patient lying in the bed with no acute distress. Chronically ill EYES: Pupils equal, round, reactive to light and accommodation. No scleral icterus. Extraocular muscles intact.  HEENT: Head atraumatic, normocephalic. Oropharynx and nasopharynx clear.  NECK:  Supple, no jugular venous distention. No thyroid enlargement, no tenderness.  LUNGS: Normal breath sounds bilaterally, no wheezing, rales, rhonchi. No use of accessory muscles of respiration.  CARDIOVASCULAR: S1, S2 normal. No murmurs, rubs, or gallops.  ABDOMEN: Soft,  nontender, nondistended. Bowel sounds present. No organomegaly or mass. Scrotal edema EXTREMITIES: No cyanosis, clubbing or edema b/l.   Right BKA. Left in protective boot NEUROLOGIC: Cranial nerves II through XII are intact. No focal Motor or sensory deficits b/l.   PSYCHIATRIC:  patient is alert and oriented x 3.  SKIN:chronic decubitus on admission  LABORATORY PANEL:  CBC  Recent Labs Lab 08/16/16 0221 08/17/16 0805  WBC 9.3  --   HGB 7.5* 8.4*  HCT 22.4*  --   PLT 271  --     Chemistries   Recent Labs Lab 08/15/16 2201 08/16/16 0221  NA 139  --   K 4.2  --   CL 109  --   CO2 26  --   GLUCOSE 339*  --   BUN 30*  --   CREATININE 1.73* 1.73*  CALCIUM 8.1*  --    Cardiac Enzymes  Recent Labs Lab 08/16/16 0810  TROPONINI 0.03*   RADIOLOGY:  Dg Chest 2 View  Result Date: 08/15/2016 CLINICAL DATA:  Chest pain and shortness of breath. EXAM: CHEST  2 VIEW COMPARISON:  AP radiograph 07/17/2016 FINDINGS: Tip of the right upper extremity PICC in the mid SVC. Small bilateral pleural effusions. Bibasilar opacities, left greater than right. There is volume loss in the right lung with elevated right hemidiaphragm. Unchanged heart size and mediastinal contours with atherosclerosis of the thoracic aorta. No pneumothorax. No pulmonary edema. Stable osseous structures. IMPRESSION: Small bilateral pleural effusions and bibasilar airspace disease. This may be atelectasis, pneumonia, or aspiration. Mild volume loss in the right lung with elevation of right hemidiaphragm. Thoracic aortic atherosclerosis. Electronically Signed   By: Rubye OaksMelanie  Ehinger M.D.   On:  08/15/2016 22:42   ASSESSMENT AND PLAN:  66 y.o. male with a history of CAD status post STEMI and an STEMI, CHF and ischemic cardiomyopathy, HTN, DM now being admitted with:  1. Chest pain, rule out ACS - negative troponins so far - Morphine, nitro, beta blocker, aspirin and statin ordered.   - Cardiology consult Appreciated. No  further cardiac workup recommended  2. H/O CAD s/p STEMI, NSTEMI - Continue Plavix, Lipitor  3. Acute on chronic diastolic CHF - Continue Lasix IV 80 mg twice a day -It seems patient's weight is about 15 lbs above since April 2018 -Monitor I's and O's and creatinine closely.  4. H/O HTN - Continue Coreg, hydralazine  5. H/O CKD, stable - Monitor BMP  6. H/o Diabetes - Accuchecks achs with RISS coverage - Heart healthy, carb controlled diet - Continue Lantus  7. Anemia, chronic - Continue iron -hgb 8.3---7.5 -transfuse one unit today to see if it also helps with symptom management  8. History of GERD - Protonix  9. Chronic decubitus -cont IV unasyn thru 08/23/16 -Wound consult placed  Case discussed with Care Management/Social Worker. Management plans discussed with the patient, family and they are in agreement.  CODE STATUS: Full  DVT Prophylaxis: lovenox  TOTAL TIME TAKING CARE OF THIS PATIENT: *30* minutes.  >50% time spent on counselling and coordination of care  POSSIBLE D/C IN 1 DAYS, DEPENDING ON CLINICAL CONDITION.  Note: This dictation was prepared with Dragon dictation along with smaller phrase technology. Any transcriptional errors that result from this process are unintentional.  Jia Dottavio M.D on 08/17/2016 at 1:27 PM  Between 7am to 6pm - Pager - [phone]  After 6pm go to [email or web address] - Social research officer, government  [facility]  Office  [phone]  CC: Primary care physician; Emogene Morgan, MD

## 2016-08-17 NOTE — Clinical Social Work Placement (Signed)
   CLINICAL SOCIAL WORK PLACEMENT  NOTE  Date:  08/17/2016  Patient Details  Name: Chaney BornJoel L Loss MRN: [id number] Date of Birth: 09-12-1950  Clinical Social Work is seeking post-discharge placement for this patient at the Skilled  Nursing Facility level of care (*CSW will initial, date and re-position this form in  chart as items are completed):  Yes   Patient/family provided with [place] Clinical Social Work Department's list of facilities offering this level of care within the geographic area requested by the patient (or if unable, by the patient's family).  Yes   Patient/family informed of their freedom to choose among providers that offer the needed level of care, that participate in Medicare, Medicaid or managed care program needed by the patient, have an available bed and are willing to accept the patient.  Yes   Patient/family informed of [facility]'s ownership interest in [facility] and [facility], as well as of the fact that they are under no obligation to receive care at these facilities.  PASRR submitted to EDS on 08/17/16     PASRR number received on       Existing PASRR number confirmed on 08/17/16     FL2 transmitted to all facilities in geographic area requested by pt/family on 08/17/16     FL2 transmitted to all facilities within larger geographic area on       Patient informed that his/her managed care company has contracts with or will negotiate with certain facilities, including the following:        Yes   Patient/family informed of bed offers received.  [facility]     Physician recommends and patient chooses bed at      [facility] to [facility] on  .  Patient to be transferred to facility by       Patient family notified on   of transfer.  Name of family member notified:        PHYSICIAN Please sign FL2     Additional Comment:     _______________________________________________ Darleene CleaverAnterhaus, Makinlee Awwad R, LCSWA 08/17/2016, 6:04 PM

## 2016-08-17 NOTE — Progress Notes (Signed)
Progress Note  Patient Name: Damon [name] Date of Encounter: 08/17/2016  Primary Cardiologist: Judie Petit. Kirke Corin, MD   Subjective   No chest pain.  Has been short of breath - feels better w/ O2.  Biggest complaint this am is that of scrotal edema.  Can't sit upright as a result - very uncomfortable.  Inpatient Medications    Scheduled Meds: . dextrose      . acidophilus  1 capsule Oral Daily  . aspirin EC  81 mg Oral Daily  . atorvastatin  80 mg Oral q1800  . carvedilol  25 mg Oral BID WC  . clopidogrel  75 mg Oral Daily  . collagenase   Topical Daily  . enoxaparin (LOVENOX) injection  40 mg Subcutaneous Q24H  . feeding supplement (GLUCERNA SHAKE)  237 mL Oral TID BM  . feeding supplement (PRO-STAT SUGAR FREE 64)  30 mL Oral TID WC  . ferrous sulfate  325 mg Oral TID WC  . furosemide  20 mg Oral Daily  . hydrALAZINE  25 mg Oral Q8H  . insulin aspart  0-15 Units Subcutaneous TID WC  . insulin glargine  10 Units Subcutaneous QHS  . multivitamin with minerals  1 tablet Oral Daily  . pantoprazole  40 mg Oral Daily  . polyethylene glycol  17 g Oral Daily  . senna-docusate  1 tablet Oral Daily  . sodium chloride flush  10-40 mL Intracatheter Q12H  . tamsulosin  0.4 mg Oral Daily  . vitamin C  500 mg Oral BID   Continuous Infusions: . ampicillin-sulbactam (UNASYN) IVPB 3 g 3 g (08/17/16 0627)   PRN Meds: acetaminophen, ALPRAZolam, gabapentin, gi cocktail, HYDROcodone-acetaminophen, ipratropium-albuterol, Melatonin, morphine injection, nitroGLYCERIN, ondansetron (ZOFRAN) IV, ondansetron, sodium chloride flush, zolpidem   Vital Signs    Vitals:   08/16/16 1714 08/16/16 1757 08/16/16 2109 08/17/16 0448  BP: (!) 167/68  (!) 135/53 (!) 148/58  Pulse: 88  74 75  Resp: 18  16 16   Temp: 98.5 F (36.9 C)  98.3 F (36.8 C) 98.3 F (36.8 C)  TempSrc: Oral  Oral Oral  SpO2: 100% 100% 99% 98%  Weight:      Height:        Intake/Output Summary (Last 24 hours) at 08/17/16 0831 Last  data filed at 08/16/16 2109  Gross per 24 hour  Intake              730 ml  Output              700 ml  Net               30 ml   Filed Weights   08/16/16 0107  Weight: 183 lb 12.8 oz (83.4 kg)    Physical Exam   GEN: Well nourished, well developed, in no acute distress.  HEENT: Grossly normal.  Neck: Supple, obese, difficult to gauge jvp, no carotid bruits, or masses. Cardiac: RRR, no murmurs, rubs, or gallops. No clubbing, cyanosis, edema.  R BKA. Respiratory:  Respirations regular and unlabored, diminished breath sounds in bilat bases w/ faint exp wheezing. GI: Soft, nontender, nondistended, BS + x 4. MS: no deformity or atrophy. Skin: warm and dry, no rash. Neuro:  Strength and sensation are intact. Psych: AAOx3.  Normal affect. Genitals: significant scrotal edema.  Labs    Chemistry Recent Labs Lab 08/15/16 2201 08/16/16 0221  NA 139  --   K 4.2  --   CL 109  --  CO2 26  --   GLUCOSE 339*  --   BUN 30*  --   CREATININE 1.73* 1.73*  CALCIUM 8.1*  --   GFRNONAA 40* 40*  GFRAA 46* 46*  ANIONGAP 4*  --      Hematology Recent Labs Lab 08/15/16 2201 08/16/16 0221 08/17/16 0805  WBC 10.3 9.3  --   RBC 2.77* 2.47*  --   HGB 8.3* 7.5* 8.4*  HCT 24.9* 22.4*  --   MCV 90.0 90.7  --   MCH 29.9 30.4  --   MCHC 33.2 33.5  --   RDW 18.1* 17.8*  --   PLT 302 271  --     Cardiac Enzymes Recent Labs Lab 08/15/16 2201 08/16/16 0221 08/16/16 0810  TROPONINI <0.03 <0.03 0.03*   Radiology    Dg Chest 2 View  Result Date: 08/15/2016 CLINICAL DATA:  Chest pain and shortness of breath. EXAM: CHEST  2 VIEW COMPARISON:  AP radiograph 07/17/2016 FINDINGS: Tip of the right upper extremity PICC in the mid SVC. Small bilateral pleural effusions. Bibasilar opacities, left greater than right. There is volume loss in the right lung with elevated right hemidiaphragm. Unchanged heart size and mediastinal contours with atherosclerosis of the thoracic aorta. No pneumothorax.  No pulmonary edema. Stable osseous structures. IMPRESSION: Small bilateral pleural effusions and bibasilar airspace disease. This may be atelectasis, pneumonia, or aspiration. Mild volume loss in the right lung with elevation of right hemidiaphragm. Thoracic aortic atherosclerosis. Electronically Signed   By: Rubye OaksMelanie  Ehinger M.D.   On: 08/15/2016 22:42    Telemetry    rsr - Personally Reviewed  Cardiac Studies   2D Echocardiogram 5.10.2018  Study Conclusions  - Left ventricle: The cavity size was normal. Wall thickness was   increased in a pattern of severe LVH. Septum>posterior wall)   Systolic function was normal. The estimated ejection fraction was   in the range of 55% to 60%. Mild hypokinesis of the apical   myocardium. Doppler parameters are consistent with abnormal left   ventricular relaxation (grade 1 diastolic dysfunction). - Aortic valve: There was moderate regurgitation. - Mitral valve: There was mild regurgitation. - Left atrium: The atrium was mildly dilated. - Pulmonary arteries: Systolic pressure was mildly to moderately   increased. PA peak pressure: 48 mm Hg (S). - Pericardium, extracardiac: A small pericardial effusion was   identified. There was a small left pleural effusion.  Patient Profile     66 y.o. 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  Assessment & Plan    1.  CAD/Atypical chest pain/elevated troponin:  Pt admitted with chest pain while being rotated in bed @ SNF.  ECG non-acute.  Trop minimally elevated @ 0.03.  This does not appear to represent ACS.  In light of cath last year, atypical Ss, minimal trop elevation, and CKD III, would not pursue further ischemic eval @ this time.  Cont asa, statin,  blocker, plavix.  2.  Acute on  chronic CHF (previously systolic, now diastolic):  Pt with EF of 30-35% @ time of MI in 02/2016.  Echo on 5/10 shows normalization of LV.  He has been experiencing dyspnea that is improved w/ supplemental O2 and also scrotal edema.  Review  of his weights since April shows that he is up 50 lbs and ~ 13 lbs just since 07/20/2016.  PASP on echo.  Will switch lasix from PO to 80 IV bid.  Follow renal fxn closely with diuresis.  HR's stable.  BP moderately elevated - follow with diuresis.  3.  CKD III: currently stable.  Follow w/ diuresis.  4.  Anemia of chornic dzs:  s/p 2u prbc's - may be contributing to volume overload @ this point.  Follow.  5.  PAD:  Most recently s/p R BKA.  6.  HL:  Cont statin.  Signed, Nicolasa Ducking, NP  08/17/2016, 8:31 AM      Attending Note Patient seen and examined, agree with detailed note above,  Patient presentation and plan discussed on rounds.   Patient complains of scrotal edema Laying in Trendelenburg, very mild shortness of breath Weight reviewed over the past several weeks, weight trending upwards Received unit of blood yesterday Hemoglobin 7.5 up to 8.4  On physical exam he is playing with his bed, in Trendelenburg. Felt if he laid in Trendelenburg he would have smaller scrotum JVD 12+, lungs with rales at bases, heart sounds regular Abdomen soft nontender, no significant lower extremity edema, amputation on the right  Creatinine 1.7  --- Acute on chronic diastolic CHF Now with normal ejection fraction Severe LVH, elevated right heart pressures on echo Agree with IV Lasix today May need additional dose this evening or tomorrow  We'll need to monitor renal function given creatinine 1.7  ----Chest pain No further chest pain since admission, atypical in nature Happened while transferring, possibly musculoskeletal   Greater than 50% was spent in counseling and coordination of care with patient Total encounter time 25 minutes or  more   Signed: Dossie Arbour  M.D., Ph.D. [facility]

## 2016-08-17 NOTE — [place] FL2 (Signed)
[place] MEDICAID FL2 LEVEL OF CARE SCREENING TOOL     IDENTIFICATION  Patient Name: Damon [name] Birthdate: [birth year] Sex: male Admission Date (Current Location): 08/15/2016  [facility] and [place] Number:  Engineer, civil (consulting) and Address:  [facility], [address], [place], [place] [address]      Provider Number: [id number]  Attending Physician Name and Address:  Enedina Finner, MD  Relative Name and Phone Number:  Damon, [name] [phone]     Current Level of Care: Hospital Recommended Level of Care: Skilled Nursing Facility Prior Approval Number:    Date Approved/Denied:   PASRR Number: [id number]  Discharge Plan: SNF    Current Diagnoses: Patient Active Problem List   Diagnosis Date Noted  . Acute on chronic heart failure (HCC) 08/17/2016  . Pressure injury of skin 08/16/2016  . CKD (chronic kidney disease), stage III 08/16/2016  . CAD (coronary artery disease)   . Chest pain, rule out acute myocardial infarction 08/15/2016  . Abscess, gluteal, left   . Necrotizing soft tissue infection   . Ulcer of left foot (HCC) 07/07/2016  . Hx of BKA, right (HCC) 06/18/2016  . PAD (peripheral artery disease) (HCC) 06/18/2016  . Atypical chest pain   . Preop cardiovascular exam   . Chronic systolic heart failure (HCC)   . Sepsis (HCC) 06/01/2016  . Stable angina (HCC)   . Anemia   . Cellulitis   . Hypertensive urgency   . Right foot ulcer (HCC) 05/03/2016  . Diabetes mellitus type 2 with complications (HCC) 05/02/2016  . 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 Protein-calorie malnutrition, severe 03/28/2016  . Acute renal failure (ARF) (HCC) 03/27/2016  . Hypotension 03/27/2016  . SOB (shortness of breath) 02/15/2016  . CKD (chronic kidney disease), stage II 02/15/2016  . Coronary artery disease 01/17/2016  . Essential hypertension 01/17/2016  . HLD  (hyperlipidemia) 01/17/2016  . ST elevation myocardial infarction involving left anterior descending (LAD) coronary artery (HCC)   . Cardiomyopathy, ischemic   . Acute respiratory failure (HCC) 01/13/2016  . NSTEMI (non-ST elevated myocardial infarction) (HCC)     Orientation RESPIRATION BLADDER Height & Weight     Self, Time, Situation, Place  O2 (2L) Continent Weight: 183 lb 12.8 oz (83.4 kg) Height:  5\' 9"  (175.3 cm)  BEHAVIORAL SYMPTOMS/MOOD NEUROLOGICAL BOWEL NUTRITION STATUS      Continent Diet (Heart Healthy diet)  AMBULATORY STATUS COMMUNICATION OF NEEDS Skin   Limited Assist Verbally PU Stage and Appropriate Care     PU Stage 3 Dressing: No Dressing (Change Every 3 days)                 Personal Care Assistance Level of Assistance  Bathing, Feeding, Dressing Bathing Assistance: Limited assistance Feeding assistance: Independent Dressing Assistance: Limited assistance     Functional Limitations Info  Sight, Hearing Sight Info: Adequate Hearing Info: Adequate      SPECIAL CARE FACTORS FREQUENCY  PT (By licensed PT)     PT Frequency: 5x a week              Contractures Contractures Info: Not present    Additional Factors Info  Code Status, Allergies, Psychotropic, Insulin Sliding Scale Code Status Info: Full Code Allergies Info: NKA   Insulin Sliding Scale Info: insulin aspart (novoLOG) injection 0-15 Units 3x a day with meals       Current Medications (08/17/2016):  This is the current  hospital active medication list Current Facility-Administered Medications  Medication Dose Route Frequency Provider Last Rate Last Dose  . acetaminophen (TYLENOL) tablet 650 mg  650 mg Oral Q4H PRN Hugelmeyer, Alexis, DO      . acidophilus (RISAQUAD) capsule 1 capsule  1 capsule Oral Daily Hugelmeyer, Alexis, DO   1 capsule at 08/17/16 1039  . ALPRAZolam (XANAX) tablet 0.25 mg  0.25 mg Oral BID PRN Hugelmeyer, Alexis, DO      . Ampicillin-Sulbactam (UNASYN) 3 g in  sodium chloride 0.9 % 100 mL IVPB  3 g Intravenous Q6H Hugelmeyer, Alexis, DO   Stopped at 08/17/16 1432  . aspirin EC tablet 81 mg  81 mg Oral Daily Eula Listen M, PA-C   81 mg at 08/17/16 1037  . atorvastatin (LIPITOR) tablet 80 mg  80 mg Oral q1800 Hugelmeyer, Alexis, DO   80 mg at 08/17/16 1809  . carvedilol (COREG) tablet 25 mg  25 mg Oral BID WC Eula Listen M, PA-C   25 mg at 08/17/16 1809  . clopidogrel (PLAVIX) tablet 75 mg  75 mg Oral Daily Hugelmeyer, Alexis, DO   75 mg at 08/17/16 1036  . collagenase (SANTYL) ointment   Topical Daily Enedina Finner, MD      . enoxaparin (LOVENOX) injection 40 mg  40 mg Subcutaneous Q24H Enedina Finner, MD   40 mg at 08/17/16 1038  . feeding supplement (GLUCERNA SHAKE) (GLUCERNA SHAKE) liquid 237 mL  237 mL Oral TID BM Hugelmeyer, Alexis, DO   237 mL at 08/17/16 1808  . feeding supplement (PRO-STAT SUGAR FREE 64) liquid 30 mL  30 mL Oral TID WC Hugelmeyer, Alexis, DO   30 mL at 08/17/16 1808  . ferrous sulfate tablet 325 mg  325 mg Oral TID WC Hugelmeyer, Alexis, DO   325 mg at 08/17/16 1809  . furosemide (LASIX) injection 80 mg  80 mg Intravenous BID Ok Anis, NP   80 mg at 08/17/16 1809  . gabapentin (NEURONTIN) capsule 300 mg  300 mg Oral Daily PRN Hugelmeyer, Alexis, DO      . gi cocktail (Maalox,Lidocaine,Donnatal)  30 mL Oral QID PRN Hugelmeyer, Alexis, DO      . hydrALAZINE (APRESOLINE) tablet 25 mg  25 mg Oral Q8H Hugelmeyer, Alexis, DO   25 mg at 08/17/16 1425  . HYDROcodone-acetaminophen (NORCO/VICODIN) 5-325 MG per tablet 1 tablet  1 tablet Oral Q6H PRN Hugelmeyer, Alexis, DO      . insulin aspart (novoLOG) injection 0-15 Units  0-15 Units Subcutaneous TID WC Enedina Finner, MD   2 Units at 08/16/16 1713  . insulin glargine (LANTUS) injection 10 Units  10 Units Subcutaneous QHS Enedina Finner, MD   10 Units at 08/16/16 2221  . ipratropium-albuterol (DUONEB) 0.5-2.5 (3) MG/3ML nebulizer solution 3 mL  3 mL Nebulization Q6H PRN Enedina Finner, MD   3  mL at 08/17/16 1358  . Melatonin TABS 5 mg  5 mg Oral QHS PRN Hugelmeyer, Alexis, DO      . multivitamin with minerals tablet 1 tablet  1 tablet Oral Daily Enedina Finner, MD   1 tablet at 08/17/16 1040  . nitroGLYCERIN (NITROSTAT) SL tablet 0.4 mg  0.4 mg Sublingual Q5 min PRN Hugelmeyer, Alexis, DO      . ondansetron (ZOFRAN) injection 4 mg  4 mg Intravenous Q6H PRN Hugelmeyer, Alexis, DO   4 mg at 08/16/16 2228  . ondansetron (ZOFRAN) tablet 4 mg  4 mg Oral Q8H PRN Hugelmeyer, Alexis, DO      .  pantoprazole (PROTONIX) EC tablet 40 mg  40 mg Oral Daily Hugelmeyer, Alexis, DO   40 mg at 08/17/16 1036  . polyethylene glycol (MIRALAX / GLYCOLAX) packet 17 g  17 g Oral Daily Hugelmeyer, Alexis, DO   17 g at 08/17/16 1037  . senna-docusate (Senokot-S) tablet 1 tablet  1 tablet Oral Daily Hugelmeyer, Alexis, DO   1 tablet at 08/17/16 1037  . sodium chloride flush (NS) 0.9 % injection 10-40 mL  10-40 mL Intracatheter Q12H Hugelmeyer, Alexis, DO   10 mL at 08/16/16 2233  . sodium chloride flush (NS) 0.9 % injection 10-40 mL  10-40 mL Intracatheter PRN Hugelmeyer, Alexis, DO   10 mL at 08/16/16 0200  . tamsulosin (FLOMAX) capsule 0.4 mg  0.4 mg Oral Daily Hugelmeyer, Alexis, DO   0.4 mg at 08/17/16 1037  . vitamin C (ASCORBIC ACID) tablet 500 mg  500 mg Oral BID Hugelmeyer, Alexis, DO   500 mg at 08/17/16 1037  . zolpidem (AMBIEN) tablet 5 mg  5 mg Oral QHS PRN,MR X 1 Hugelmeyer, Alexis, DO   5 mg at 08/16/16 2228     Discharge Medications: Please see discharge summary for a list of discharge medications.  Relevant Imaging Results:  Relevant Lab Results:   Additional Information SSN [id number]  Darleene Cleavernterhaus, Quaniya Damas R, [place]WA

## 2016-08-17 NOTE — Clinical Social Work Note (Addendum)
Clinical Social Work Assessment  Patient Details  Name: Damon [name] MRN: [id number] Date of Birth: Aug 21, 1950  Date of referral:  08/17/16               Reason for consult:  Facility Placement                Permission sought to share information with:  Facility Medical sales representativeContact Representative, Family Supports Permission granted to share information::  Yes, Verbal Permission Granted  Name::     Damon [name],Damon [name] [phone]   Agency::  SNF admissions  Relationship::     Contact Information:     Housing/Transportation Living arrangements for the past 2 months:  Skilled Nursing Facility, Single Family Home Source of Information:  Patient Patient Interpreter Needed:  None Criminal Activity/Legal Involvement Pertinent to Current Situation/Hospitalization:  No - Comment as needed Significant Relationships:  [name] Lives with:  Facility Resident Do you feel safe going back to the place where you live?  Yes Need for family participation in patient care:  No (Coment)  Care giving concerns:  Patient did not express any concerns about returning back to SNF except is far away.   Social Worker assessment / plan:  Patient is a 66 year old male who is married and alert and oriented x4.  Patient has been at [facility] [place] for about 4 weeks to receive iv antibiotics and PT.  Patient is a self-pay and his disability and Medicare are still pending.  Patient states he feels like he is close to discharging back home.  Patient was exlplained role of CSW and process for getting him placed at SNF again.  Patient did not have any other questions or concerns.  Patient will need updated LOG.  Employment status:  Disabled (Comment on whether or not currently receiving Disability), Retired Health and safety inspectornsurance information:  Self Pay (Medicaid Pending) PT Recommendations:  Skilled Nursing Facility Information / Referral to community resources:  Skilled Nursing Facility  Patient/Family's Response to care:  Patient is in agreement to returning to SNF.  Patient/Family's Understanding of and Emotional Response to Diagnosis, Current Treatment, and Prognosis: Patient aware of current treatment plan and diagnosis.  Emotional Assessment Appearance:  Appears stated age Attitude/Demeanor/Rapport:    Affect (typically observed):  Appropriate, Calm Orientation:  Oriented to Self, Oriented to Place, Oriented to Situation, Oriented to  Time Alcohol / Substance use:  Not Applicable Psych involvement (Current and /or in the community):  No (Comment)  Discharge Needs  Concerns to be addressed:  No discharge needs identified Readmission within the last 30 days:  Yes (07-18-16 [facility] [place]) Current discharge risk:  None Barriers to Discharge:  Continued Medical Work up   Damon [name], Damon [name], LCSWA 08/17/2016, 6:00 PM

## 2016-08-17 NOTE — Clinical Social Work Note (Signed)
CSW received consult that patient is from [facility] [place] on a 30 day LOG for iv antibiotics and some short term rehab.  Patient plans to return back to [facility] [place] to finish his antibiotics and continue with therapy.  Patient will need updated LOG.  Ervin KnackEric R. Hazaiah Edgecombe, MSW, Theresia MajorsLCSWA [phone]  08/17/2016 6:15 PM

## 2016-08-18 DIAGNOSIS — I5033 Acute on chronic diastolic (congestive) heart failure: Secondary | ICD-10-CM

## 2016-08-18 LAB — GLUCOSE, CAPILLARY
GLUCOSE-CAPILLARY: 167 mg/dL — AB (ref 65–99)
Glucose-Capillary: 176 mg/dL — ABNORMAL HIGH (ref 65–99)
Glucose-Capillary: 101 mg/dL — ABNORMAL HIGH (ref 65–99)
Glucose-Capillary: 124 mg/dL — ABNORMAL HIGH (ref 65–99)

## 2016-08-18 MED ORDER — FUROSEMIDE 10 MG/ML IJ SOLN
40.0000 mg | Freq: Three times a day (TID) | INTRAMUSCULAR | Status: DC
  Administered 2016-08-18 – 2016-08-19 (×3): 40 mg via INTRAVENOUS
  Filled 2016-08-18 (×3): qty 4

## 2016-08-18 NOTE — Progress Notes (Signed)
Progress Note  Patient Name: Damon [name] Date of Encounter: 08/18/2016 is  Primary Cardiologist: M. Kirke Corin, MD   Subjective   No chest pain.  He reports continued shortness of breath, scrotal edema   does not feel it has resolved yet   requesting assistance rotating in his bed   Inpatient Medications    Scheduled Meds: . acidophilus  1 capsule Oral Daily  . aspirin EC  81 mg Oral Daily  . atorvastatin  80 mg Oral q1800  . carvedilol  25 mg Oral BID WC  . clopidogrel  75 mg Oral Daily  . collagenase   Topical Daily  . enoxaparin (LOVENOX) injection  40 mg Subcutaneous Q24H  . feeding supplement (GLUCERNA SHAKE)  237 mL Oral TID BM  . feeding supplement (PRO-STAT SUGAR FREE 64)  30 mL Oral TID WC  . ferrous sulfate  325 mg Oral TID WC  . furosemide  40 mg Intravenous Q8H  . hydrALAZINE  25 mg Oral Q8H  . insulin aspart  0-15 Units Subcutaneous TID WC  . insulin glargine  10 Units Subcutaneous QHS  . multivitamin with minerals  1 tablet Oral Daily  . pantoprazole  40 mg Oral Daily  . polyethylene glycol  17 g Oral Daily  . senna-docusate  1 tablet Oral Daily  . sodium chloride flush  10-40 mL Intracatheter Q12H  . tamsulosin  0.4 mg Oral Daily  . vitamin C  500 mg Oral BID   Continuous Infusions: . ampicillin-sulbactam (UNASYN) IVPB 3 g 3 g (08/18/16 1352)   PRN Meds: acetaminophen, ALPRAZolam, gabapentin, gi cocktail, HYDROcodone-acetaminophen, ipratropium-albuterol, Melatonin, nitroGLYCERIN, ondansetron (ZOFRAN) IV, ondansetron, sodium chloride flush, zolpidem   Vital Signs    Vitals:   08/17/16 2033 08/18/16 0401 08/18/16 0813 08/18/16 1120  BP: (!) 145/57 (!) 156/63 (!) 156/58 (!) 132/49  Pulse: 75 86 85 80  Resp: 18 18 18  (!) 21  Temp: 98.4 F (36.9 C) 98 F (36.7 C) 99.3 F (37.4 C) 99.1 F (37.3 C)  TempSrc: Oral  Oral Oral  SpO2: 100% 95% 100% 100%  Weight:      Height:        Intake/Output Summary (Last 24 hours) at 08/18/16 1354 Last data  filed at 08/18/16 1126  Gross per 24 hour  Intake              200 ml  Output             5250 ml  Net            -5050 ml   Filed Weights   08/16/16 0107  Weight: 183 lb 12.8 oz (83.4 kg)    Physical Exam    GEN: Well nourished, well developed, in no acute distress, lying supine in bed, comfortable HEENT: Grossly normal.  Neck: Supple, obese, difficult to gauge jvp, no carotid bruits, or masses. Cardiac: RRR, no murmurs, rubs, or gallops. No clubbing, cyanosis, edema.  R BKA. Respiratory:  Respirations regular and unlabored, diminished breath sounds in bilat bases w/ faint exp wheezing. GI: Soft, nontender, nondistended, BS + x 4. MS: no deformity or atrophy. Skin: warm and dry, no rash. Neuro:  Strength and sensation are intact. Psych: AAOx3.  Normal affect.   Labs    Chemistry  Recent Labs Lab 08/15/16 2201 08/16/16 0221  NA 139  --   K 4.2  --   CL 109  --   CO2 26  --   GLUCOSE 339*  --  BUN 30*  --   CREATININE 1.73* 1.73*  CALCIUM 8.1*  --   GFRNONAA 40* 40*  GFRAA 46* 46*  ANIONGAP 4*  --      Hematology  Recent Labs Lab 08/15/16 2201 08/16/16 0221 08/17/16 0805  WBC 10.3 9.3  --   RBC 2.77* 2.47*  --   HGB 8.3* 7.5* 8.4*  HCT 24.9* 22.4*  --   MCV 90.0 90.7  --   MCH 29.9 30.4  --   MCHC 33.2 33.5  --   RDW 18.1* 17.8*  --   PLT 302 271  --     Cardiac Enzymes  Recent Labs Lab 08/15/16 2201 08/16/16 0221 08/16/16 0810  TROPONINI <0.03 <0.03 0.03*   Radiology    Dg Chest 2 View  Result Date: 08/17/2016 CLINICAL DATA:  Shortness of breath and weakness EXAM: CHEST  2 VIEW COMPARISON:  08/15/2016 FINDINGS: Bilateral pleural effusions which may be partially loculated in this patient who is sitting. Chronic cardiomegaly. No evidence of pulmonary edema. No air bronchograms. Right upper extremity PICC with tip at the SVC level. IMPRESSION: 1. Small bilateral pleural effusion which may be posteriorly loculated. 2. Cardiomegaly without  pulmonary edema. Electronically Signed   By: Marnee SpringJonathon  Watts M.D.   On: 08/17/2016 15:33    Telemetry    rsr - Personally Reviewed  Cardiac Studies   2D Echocardiogram 5.10.2018  Study Conclusions  - Left ventricle: The cavity size was normal. Wall thickness was   increased in a pattern of severe LVH. Septum>posterior wall)   Systolic function was normal. The estimated ejection fraction was   in the range of 55% to 60%. Mild hypokinesis of the apical   myocardium. Doppler parameters are consistent with abnormal left   ventricular relaxation (grade 1 diastolic dysfunction). - Aortic valve: There was moderate regurgitation. - Mitral valve: There was mild regurgitation. - Left atrium: The atrium was mildly dilated. - Pulmonary arteries: Systolic pressure was mildly to moderately   increased. PA peak pressure: 48 mm Hg (S). - Pericardium, extracardiac: A small pericardial effusion was   identified. There was a small left pleural effusion.  Patient Profile     66 y.o. 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  Assessment & Plan    1.  CAD/Atypical chest pain/elevated troponin:     Cont asa, statin,  blocker, plavix. No further ischemia workup at this time  2.  Acute on chronic CHF (previously systolic, now diastolic):   Likely exacerbated by anemia EF of 30-35% @ time of MI in 02/2016.   Echo on 08/16/16 shows normalization of LV.  Shortness of breath and dramatic weight gain consistent with diastolic CHF PASP 48mmHg on echo.   Still with scrotal edema per the patient -We'll continue IV Lasix, Basic metabolic panel tomorrow  3.  CKD III:   Follow w/ diuresis.  4.  Anemia of chornic dzs:   s/p 2u prbc's - may be contributing to volume overload   5.  PAD:     Most recently s/p R BKA.  6.  HL:   Cont statin.  Signed, Julien Nordmannimothy Brycelyn Gambino, MD  08/18/2016, 1:54 PM

## 2016-08-18 NOTE — Progress Notes (Signed)
[facility] [place] at [facility]   PATIENT NAME: Damon LikesJoel Ortwein    MR#:  [id number]  DATE OF BIRTH:  May 01, 1950  SUBJECTIVE:  Came in with chest pressure and sob. Feels ok Complaints of scrotal edema UOP around 6 liters.  REVIEW OF SYSTEMS:   Review of Systems  Constitutional: Negative for chills, fever and weight loss.  HENT: Negative for ear discharge, ear pain and nosebleeds.   Eyes: Negative for blurred vision, pain and discharge.  Respiratory: Negative for sputum production, shortness of breath, wheezing and stridor.   Cardiovascular: Negative for chest pain, palpitations, orthopnea and PND.  Gastrointestinal: Negative for abdominal pain, diarrhea, nausea and vomiting.  Genitourinary: Negative for frequency and urgency.  Musculoskeletal: Negative for back pain and joint pain.  Skin:       Chronic decubitus  Neurological: Positive for weakness. Negative for sensory change, speech change and focal weakness.  Psychiatric/Behavioral: Negative for depression and hallucinations. The patient is not nervous/anxious.    Tolerating Diet:yes Tolerating PT: bed bound  DRUG ALLERGIES:  No Known Allergies  VITALS:  Blood pressure (!) 132/49, pulse 80, temperature 99.1 F (37.3 C), temperature source Oral, resp. rate (!) 21, height 5\' 9"  (1.753 m), weight 83.4 kg (183 lb 12.8 oz), SpO2 100 %.  PHYSICAL EXAMINATION:   Physical Exam  GENERAL:  66 y.o.-year-old patient lying in the bed with no acute distress. Chronically ill EYES: Pupils equal, round, reactive to light and accommodation. No scleral icterus. Extraocular muscles intact.  HEENT: Head atraumatic, normocephalic. Oropharynx and nasopharynx clear.  NECK:  Supple, no jugular venous distention. No thyroid enlargement, no tenderness.  LUNGS: Normal breath sounds bilaterally, no wheezing, rales, rhonchi. No use of accessory muscles of respiration.  CARDIOVASCULAR: S1, S2 normal. No murmurs, rubs, or  gallops.  ABDOMEN: Soft, nontender, nondistended. Bowel sounds present. No organomegaly or mass. Scrotal edema EXTREMITIES: No cyanosis, clubbing or edema b/l.   Right BKA. Left in protective boot NEUROLOGIC: Cranial nerves II through XII are intact. No focal Motor or sensory deficits b/l.   PSYCHIATRIC:  patient is alert and oriented x 3.  SKIN:chronic decubitus on admission  LABORATORY PANEL:  CBC  Recent Labs Lab 08/16/16 0221 08/17/16 0805  WBC 9.3  --   HGB 7.5* 8.4*  HCT 22.4*  --   PLT 271  --     Chemistries   Recent Labs Lab 08/15/16 2201 08/16/16 0221  NA 139  --   K 4.2  --   CL 109  --   CO2 26  --   GLUCOSE 339*  --   BUN 30*  --   CREATININE 1.73* 1.73*  CALCIUM 8.1*  --    Cardiac Enzymes  Recent Labs Lab 08/16/16 0810  TROPONINI 0.03*   RADIOLOGY:  Dg Chest 2 View  Result Date: 08/17/2016 CLINICAL DATA:  Shortness of breath and weakness EXAM: CHEST  2 VIEW COMPARISON:  08/15/2016 FINDINGS: Bilateral pleural effusions which may be partially loculated in this patient who is sitting. Chronic cardiomegaly. No evidence of pulmonary edema. No air bronchograms. Right upper extremity PICC with tip at the SVC level. IMPRESSION: 1. Small bilateral pleural effusion which may be posteriorly loculated. 2. Cardiomegaly without pulmonary edema. Electronically Signed   By: Marnee SpringJonathon  Watts M.D.   On: 08/17/2016 15:33   ASSESSMENT AND PLAN:  66 y.o. male with a history of CAD status post STEMI and an STEMI, CHF and ischemic cardiomyopathy, HTN, DM now being admitted with:  1. Chest pain, rule out ACS - negative troponins so far - Morphine, nitro, beta blocker, aspirin and statin ordered.   - Cardiology consult Appreciated. No further cardiac workup recommended  2. H/O CAD s/p STEMI, NSTEMI - Continue Plavix, Lipitor  3. Acute on chronic diastolic CHF - recieved Lasix IV 80 mg twice a day (1 day) -It seems patient's weight is about 15 lbs above since April  2018 -Monitor I's and O's and creatinine closely. -UOP 6 liters.  -lasix on hold today  4. H/O HTN - Continue Coreg, hydralazine  5. H/O CKD, stable - Monitor BMP  6. H/o Diabetes - Accuchecks achs with RISS coverage - Heart healthy, carb controlled diet - Continue Lantus  7. Anemia, chronic - Continue iron -hgb 8.3---7.5 -transfuse one unit today to see if it also helps with symptom management  8. History of GERD - Protonix  9. Chronic decubitus -cont IV unasyn thru 08/23/16 -Wound consult placed  D/c to [facility] center on monday  Case discussed with Care Management/Social Worker. Management plans discussed with the patient, family and they are in agreement.  CODE STATUS: Full  DVT Prophylaxis: lovenox  TOTAL TIME TAKING CARE OF THIS PATIENT: *30* minutes.  >50% time spent on counselling and coordination of care  POSSIBLE D/C IN 1 DAYS, DEPENDING ON CLINICAL CONDITION.  Note: This dictation was prepared with Dragon dictation along with smaller phrase technology. Any transcriptional errors that result from this process are unintentional.  Shayda Kalka M.D on 08/18/2016 at 12:35 PM  Between 7am to 6pm - Pager - [phone]  After 6pm go to [email or web address] - Social research officer, government  [facility] [place] [facility] [facility]  Office  [phone]  CC: Primary care physician; Emogene Morgan, MD

## 2016-08-18 NOTE — Progress Notes (Signed)
When his family member arrived this afternoon he stopped calling out and be turn or expressing concerns about the movement of  Bed.  Prior to this he called for assistance every 10 - 15 minutes.

## 2016-08-18 NOTE — Progress Notes (Signed)
After turning in the bed to get cleaned up He c/o being SOB.  His O2 Suration is 97%.  I explained that he was breathing faster as he had done the work or turning and he was breathing faster no necessary low on O2.

## 2016-08-19 LAB — GLUCOSE, CAPILLARY
GLUCOSE-CAPILLARY: 176 mg/dL — AB (ref 65–99)
Glucose-Capillary: 291 mg/dL — ABNORMAL HIGH (ref 65–99)
Glucose-Capillary: 171 mg/dL — ABNORMAL HIGH (ref 65–99)
Glucose-Capillary: 138 mg/dL — ABNORMAL HIGH (ref 65–99)

## 2016-08-19 LAB — BASIC METABOLIC PANEL
Anion gap: 5 (ref 5–15)
BUN: 38 mg/dL — ABNORMAL HIGH (ref 6–20)
CHLORIDE: 101 mmol/L (ref 101–111)
CO2: 34 mmol/L — AB (ref 22–32)
CREATININE: 1.72 mg/dL — AB (ref 0.61–1.24)
Calcium: 8.1 mg/dL — ABNORMAL LOW (ref 8.9–10.3)
GFR calc Af Amer: 47 mL/min — ABNORMAL LOW (ref >60)
GFR calc non Af Amer: 40 mL/min — ABNORMAL LOW (ref >60)
Glucose, Bld: 266 mg/dL — ABNORMAL HIGH (ref 65–99)
Potassium: 3.9 mmol/L (ref 3.5–5.1)
Sodium: 140 mmol/L (ref 135–145)

## 2016-08-19 MED ORDER — FUROSEMIDE 10 MG/ML IJ SOLN: 40.0000 mg | Freq: Every day | INTRAMUSCULAR | Status: DC

## 2016-08-19 MED ORDER — FUROSEMIDE 40 MG PO TABS: 40.0000 mg | ORAL_TABLET | Freq: Every day | ORAL | Status: DC

## 2016-08-19 NOTE — Plan of Care (Signed)
Problem: Skin Integrity: Goal: Risk for impaired skin integrity will decrease Outcome: Progressing The majority of the tissue within the sacral wound is read and healthy looking. Only small areas are yellow exudate.

## 2016-08-19 NOTE — Progress Notes (Signed)
[facility] - [place] at [facility]   PATIENT NAME: Damon [name]    MR#:  [id number]  DATE OF BIRTH:  1951/02/01  SUBJECTIVE:  Came in with chest pressure and sob. Feels ok Complaints of scrotal edema Patient shortness of breath much improved able to date on flat in the bed.  REVIEW OF SYSTEMS:   Review of Systems  Constitutional: Negative for chills, fever and weight loss.  HENT: Negative for ear discharge, ear pain and nosebleeds.   Eyes: Negative for blurred vision, pain and discharge.  Respiratory: Negative for sputum production, shortness of breath, wheezing and stridor.   Cardiovascular: Negative for chest pain, palpitations, orthopnea and PND.  Gastrointestinal: Negative for abdominal pain, diarrhea, nausea and vomiting.  Genitourinary: Negative for frequency and urgency.  Musculoskeletal: Negative for back pain and joint pain.  Skin:       Chronic decubitus  Neurological: Positive for weakness. Negative for sensory change, speech change and focal weakness.  Psychiatric/Behavioral: Negative for depression and hallucinations. The patient is not nervous/anxious.    Tolerating Diet:yes Tolerating PT: bed bound  DRUG ALLERGIES:  No Known Allergies  VITALS:  Blood pressure (!) 155/67, pulse 73, temperature 98.9 F (37.2 C), temperature source Oral, resp. rate 16, height 5\' 9"  (1.753 m), weight 83.4 kg (183 lb 12.8 oz), SpO2 100 %.  PHYSICAL EXAMINATION:   Physical Exam  GENERAL:  66 y.o.-year-old patient lying in the bed with no acute distress. Chronically ill EYES: Pupils equal, round, reactive to light and accommodation. No scleral icterus. Extraocular muscles intact.  HEENT: Head atraumatic, normocephalic. Oropharynx and nasopharynx clear.  NECK:  Supple, no jugular venous distention. No thyroid enlargement, no tenderness.  LUNGS: Normal breath sounds bilaterally, no wheezing, rales, rhonchi. No use of accessory muscles of respiration.   CARDIOVASCULAR: S1, S2 normal. No murmurs, rubs, or gallops.  ABDOMEN: Soft, nontender, nondistended. Bowel sounds present. No organomegaly or mass. Scrotal edema EXTREMITIES: No cyanosis, clubbing or edema b/l.   Right BKA. Left in protective boot NEUROLOGIC: Cranial nerves II through XII are intact. No focal Motor or sensory deficits b/l.   PSYCHIATRIC:  patient is alert and oriented x 3.  SKIN:chronic decubitus on admission  LABORATORY PANEL:  CBC  Recent Labs Lab 08/16/16 0221 08/17/16 0805  WBC 9.3  --   HGB 7.5* 8.4*  HCT 22.4*  --   PLT 271  --     Chemistries   Recent Labs Lab 08/19/16 0527  NA 140  K 3.9  CL 101  CO2 34*  GLUCOSE 266*  BUN 38*  CREATININE 1.72*  CALCIUM 8.1*   Cardiac Enzymes  Recent Labs Lab 08/16/16 0810  TROPONINI 0.03*   RADIOLOGY:  Dg Chest 2 View  Result Date: 08/17/2016 CLINICAL DATA:  Shortness of breath and weakness EXAM: CHEST  2 VIEW COMPARISON:  08/15/2016 FINDINGS: Bilateral pleural effusions which may be partially loculated in this patient who is sitting. Chronic cardiomegaly. No evidence of pulmonary edema. No air bronchograms. Right upper extremity PICC with tip at the SVC level. IMPRESSION: 1. Small bilateral pleural effusion which may be posteriorly loculated. 2. Cardiomegaly without pulmonary edema. Electronically Signed   By: Marnee SpringJonathon  Watts M.D.   On: 08/17/2016 15:33   ASSESSMENT AND PLAN:  66 y.o. male with a history of CAD status post STEMI and an STEMI, CHF and ischemic cardiomyopathy, HTN, DM now being admitted with:  1. Chest pain, rule out ACS - negative troponins so far - Morphine, nitro,  beta blocker, aspirin and statin ordered.   - Cardiology consult Appreciated. No further cardiac workup recommended  2. H/O CAD s/p STEMI, NSTEMI - Continue Plavix, Lipitor  3. Acute on chronic diastolic CHF - recieved Lasix IV 80 mg twice a day (1 day)---now on Lasix 40 mg 3 times a day -It seems patient's weight  is about 15 lbs above since April 2018 -Monitor I's and O's and creatinine closely. -Patient's urine output is negative by 11 L. He feels a whole lot better.  4. H/O HTN - Continue Coreg, hydralazine  5. H/O CKD, stable - Creatinine stable at 1.7 potassium stable  6. H/o Diabetes - Accuchecks achs with RISS coverage - Heart healthy, carb controlled diet - Continue Lantus  7. Anemia, chronic - Continue iron -hgb 8.3---7.5 -transfuse one unit today to see if it also helps with symptom management  8. History of GERD - Protonix  9. Chronic decubitus -cont IV unasyn thru 08/23/16 -Wound consult placed  D/c to [facility] center on monday  Case discussed with Care Management/Social Worker. Management plans discussed with the patient, family and they are in agreement.  CODE STATUS: Full  DVT Prophylaxis: lovenox  TOTAL TIME TAKING CARE OF THIS PATIENT: *30* minutes.  >50% time spent on counselling and coordination of care  POSSIBLE D/C IN 1 DAYS, DEPENDING ON CLINICAL CONDITION.  Note: This dictation was prepared with Dragon dictation along with smaller phrase technology. Any transcriptional errors that result from this process are unintentional.  Johnita Palleschi M.D on 08/19/2016 at 10:46 AM  Between 7am to 6pm - Pager - [phone]  After 6pm go to [email or web address] - Social research officer, government  [facility] [place] [facility]  Office  [phone]  CC: Primary care physician; Emogene Morgan, MD

## 2016-08-19 NOTE — Clinical Social Work Note (Signed)
CSW received update from attending MD that the patient will most likely discharge to [facility] [place] tomorrow. CSW will continue to follow to facilitate discharge.  Argentina PonderKaren Martha Cadience Bradfield, MSW, Theresia MajorsLCSWA [phone]

## 2016-08-19 NOTE — Progress Notes (Addendum)
Progress Note  Patient Name: Damon [name] Date of Encounter: 08/19/2016 is  Primary Cardiologist: M. Kirke Corin, MD   Subjective   No chest pain.  Dramatic improvement in his shortness of breath and scrotal edema. Nurses report difficulty managing him, wanting to adjust his position in bed every 15 minutes. Wanting pillows to be adjusted for him around his feet, requiring high level of care   Inpatient Medications    Scheduled Meds: . acidophilus  1 capsule Oral Daily  . aspirin EC  81 mg Oral Daily  . atorvastatin  80 mg Oral q1800  . carvedilol  25 mg Oral BID WC  . clopidogrel  75 mg Oral Daily  . collagenase   Topical Daily  . enoxaparin (LOVENOX) injection  40 mg Subcutaneous Q24H  . feeding supplement (GLUCERNA SHAKE)  237 mL Oral TID BM  . feeding supplement (PRO-STAT SUGAR FREE 64)  30 mL Oral TID WC  . ferrous sulfate  325 mg Oral TID WC  . [START ON 08/20/2016] furosemide  40 mg Intravenous Daily  . hydrALAZINE  25 mg Oral Q8H  . insulin aspart  0-15 Units Subcutaneous TID WC  . insulin glargine  10 Units Subcutaneous QHS  . multivitamin with minerals  1 tablet Oral Daily  . pantoprazole  40 mg Oral Daily  . polyethylene glycol  17 g Oral Daily  . senna-docusate  1 tablet Oral Daily  . sodium chloride flush  10-40 mL Intracatheter Q12H  . tamsulosin  0.4 mg Oral Daily  . vitamin C  500 mg Oral BID   Continuous Infusions: . ampicillin-sulbactam (UNASYN) IVPB 3 g Stopped (08/19/16 1202)   PRN Meds: acetaminophen, ALPRAZolam, gabapentin, gi cocktail, HYDROcodone-acetaminophen, ipratropium-albuterol, Melatonin, nitroGLYCERIN, ondansetron (ZOFRAN) IV, ondansetron, sodium chloride flush, zolpidem   Vital Signs    Vitals:   08/18/16 1937 08/19/16 0421 08/19/16 0931 08/19/16 1133  BP: (!) 160/65 (!) 157/67 (!) 155/67 (!) 142/57  Pulse: 72 70 73 79  Resp: 16 16 16 20   Temp: 99.5 F (37.5 C) 98.5 F (36.9 C) 98.9 F (37.2 C) 98.4 F (36.9 C)  TempSrc: Oral Oral  Oral Oral  SpO2: 100% 100% 100% 99%  Weight:      Height:        Intake/Output Summary (Last 24 hours) at 08/19/16 1314 Last data filed at 08/19/16 1610  Gross per 24 hour  Intake                0 ml  Output             4551 ml  Net            -4551 ml   Filed Weights   08/16/16 0107  Weight: 183 lb 12.8 oz (83.4 kg)    Physical Exam    GEN: Well nourished, well developed, in no acute distress, lying supine in bed, comfortable HEENT: Grossly normal.  Neck: Supple, obese, difficult to gauge jvp, no carotid bruits, or masses. Cardiac: RRR, no murmurs, rubs, or gallops. No clubbing, cyanosis, edema.   Respiratory:  Respirations regular and unlabored, diminished breath sounds in bilat bases w/ faint exp wheezing. GI: Soft, nontender, nondistended, BS + x 4. MS: no deformity or atrophy.R BKA. Skin: warm and dry, no rash. Neuro:  Strength and sensation are intact. Psych: AAOx3.  Normal affect.   Labs    Chemistry  Recent Labs Lab 08/15/16 2201 08/16/16 0221 08/19/16 0527  NA 139  --  140  K 4.2  --  3.9  CL 109  --  101  CO2 26  --  34*  GLUCOSE 339*  --  266*  BUN 30*  --  38*  CREATININE 1.73* 1.73* 1.72*  CALCIUM 8.1*  --  8.1*  GFRNONAA 40* 40* 40*  GFRAA 46* 46* 47*  ANIONGAP 4*  --  5     Hematology  Recent Labs Lab 08/15/16 2201 08/16/16 0221 08/17/16 0805  WBC 10.3 9.3  --   RBC 2.77* 2.47*  --   HGB 8.3* 7.5* 8.4*  HCT 24.9* 22.4*  --   MCV 90.0 90.7  --   MCH 29.9 30.4  --   MCHC 33.2 33.5  --   RDW 18.1* 17.8*  --   PLT 302 271  --     Cardiac Enzymes  Recent Labs Lab 08/15/16 2201 08/16/16 0221 08/16/16 0810  TROPONINI <0.03 <0.03 0.03*   Radiology    Dg Chest 2 View  Result Date: 08/17/2016 CLINICAL DATA:  Shortness of breath and weakness EXAM: CHEST  2 VIEW COMPARISON:  08/15/2016 FINDINGS: Bilateral pleural effusions which may be partially loculated in this patient who is sitting. Chronic cardiomegaly. No evidence of  pulmonary edema. No air bronchograms. Right upper extremity PICC with tip at the SVC level. IMPRESSION: 1. Small bilateral pleural effusion which may be posteriorly loculated. 2. Cardiomegaly without pulmonary edema. Electronically Signed   By: Marnee SpringJonathon  Watts M.D.   On: 08/17/2016 15:33    Telemetry    rsr - Personally Reviewed  Cardiac Studies   2D Echocardiogram 5.10.2018  Study Conclusions  - Left ventricle: The cavity size was normal. Wall thickness was   increased in a pattern of severe LVH. Septum>posterior wall)   Systolic function was normal. The estimated ejection fraction was   in the range of 55% to 60%. Mild hypokinesis of the apical   myocardium. Doppler parameters are consistent with abnormal left   ventricular relaxation (grade 1 diastolic dysfunction). - Aortic valve: There was moderate regurgitation. - Mitral valve: There was mild regurgitation. - Left atrium: The atrium was mildly dilated. - Pulmonary arteries: Systolic pressure was mildly to moderately   increased. PA peak pressure: 48 mm Hg (S). - Pericardium, extracardiac: A small pericardial effusion was   identified. There was a small left pleural effusion.  Patient Profile     66 y.o. 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  Assessment & Plan    1.  CAD/Atypical chest pain/elevated troponin:     Cont asa, statin,  blocker, plavix. No further ischemia workup at this time  2.  Acute on chronic CHF (previously systolic, now diastolic):   exacerbated by anemia EF of 30-35% @ time of MI in 02/2016.   Echo on 08/16/16 shows normalization of LV.  Shortness of breath and dramatic weight gain consistent with diastolic CHF PASP 48mmHg on echo.   Close to his baseline -Will decrease IV Lasix  down to once daily We'll change Lasix to 40 mg oral dose tomorrow Presented on 20 mg daily. Long discussion with him concerning these decreasing his salt and fluid intake   3.  CKD III:   Follow w/ diuresis.Stable after significant diuresis  4.  Anemia of chornic dzs:   s/p 2u prbc's  Unclear if he needs Epogen  5.  PAD:  s/p R BKA.  6.  HL:   Cont statin.  7. Large sacral decub ulcer On antibiotics, IV, PICC line in place  Long discussion with friend or family in the room, discussed fluid and salt intake with the patient, need for higher diuretic dosing Encouraged him to be more active, get out of bed 2 recliner.   Total encounter time more than 35 minutes  Greater than 50% was spent in counseling and coordination of care with the patient   Signed, Julien Nordmann, MD  08/19/2016, 1:14 PM

## 2016-08-20 ENCOUNTER — Telehealth: Payer: Self-pay | Admitting: Cardiovascular Disease

## 2016-08-20 LAB — GLUCOSE, CAPILLARY
Glucose-Capillary: 155 mg/dL — ABNORMAL HIGH (ref 65–99)
Glucose-Capillary: 174 mg/dL — ABNORMAL HIGH (ref 65–99)

## 2016-08-20 MED ORDER — FUROSEMIDE 40 MG PO TABS: 40.0000 mg | ORAL_TABLET | Freq: Every day | ORAL | 1 refills | Status: AC

## 2016-08-20 MED ORDER — FUROSEMIDE 40 MG PO TABS
40.0000 mg | ORAL_TABLET | Freq: Every day | ORAL | Status: DC
  Administered 2016-08-20: 40 mg via ORAL
  Filled 2016-08-20: qty 1

## 2016-08-20 MED ORDER — SODIUM CHLORIDE 0.9 % IV SOLN: 3.0000 g | Freq: Four times a day (QID) | INTRAVENOUS | 2 refills | Status: AC

## 2016-08-20 MED ORDER — COLLAGENASE 250 UNIT/GM EX OINT: TOPICAL_OINTMENT | Freq: Every day | CUTANEOUS | 0 refills | Status: DC

## 2016-08-20 MED ORDER — ADULT MULTIVITAMIN W/MINERALS CH: 1.0000 | ORAL_TABLET | Freq: Every day | ORAL | 0 refills | Status: AC

## 2016-08-20 NOTE — Progress Notes (Signed)
Progress Note  Patient Name: Damon [name] Date of Encounter: 08/20/2016 is  Primary Cardiologist: M. Kirke CorinArida, MD   Subjective   No chest pain. He reports improvement in shortness of breath. He is being discharged to rehabilitation today.   Inpatient Medications    Scheduled Meds: . acidophilus  1 capsule Oral Daily  . aspirin EC  81 mg Oral Daily  . atorvastatin  80 mg Oral q1800  . carvedilol  25 mg Oral BID WC  . clopidogrel  75 mg Oral Daily  . collagenase   Topical Daily  . enoxaparin (LOVENOX) injection  40 mg Subcutaneous Q24H  . feeding supplement (GLUCERNA SHAKE)  237 mL Oral TID BM  . feeding supplement (PRO-STAT SUGAR FREE 64)  30 mL Oral TID WC  . ferrous sulfate  325 mg Oral TID WC  . furosemide  40 mg Oral Daily  . hydrALAZINE  25 mg Oral Q8H  . insulin aspart  0-15 Units Subcutaneous TID WC  . insulin glargine  10 Units Subcutaneous QHS  . multivitamin with minerals  1 tablet Oral Daily  . pantoprazole  40 mg Oral Daily  . polyethylene glycol  17 g Oral Daily  . senna-docusate  1 tablet Oral Daily  . sodium chloride flush  10-40 mL Intracatheter Q12H  . tamsulosin  0.4 mg Oral Daily  . vitamin C  500 mg Oral BID   Continuous Infusions: . ampicillin-sulbactam (UNASYN) IVPB 3 g Stopped (08/20/16 0841)   PRN Meds: acetaminophen, ALPRAZolam, gabapentin, gi cocktail, HYDROcodone-acetaminophen, ipratropium-albuterol, Melatonin, nitroGLYCERIN, ondansetron (ZOFRAN) IV, ondansetron, sodium chloride flush, zolpidem   Vital Signs    Vitals:   08/19/16 1133 08/19/16 1958 08/20/16 0434 08/20/16 0807  BP: (!) 142/57 (!) 147/62 (!) 145/64 (!) 145/58  Pulse: 79 70 74 74  Resp: 20 18 18    Temp: 98.4 F (36.9 C) 98.3 F (36.8 C) 98.6 F (37 C) 98.7 F (37.1 C)  TempSrc: Oral Oral Oral Oral  SpO2: 99% 100% 100% 99%  Weight:      Height:        Intake/Output Summary (Last 24 hours) at 08/20/16 0830 Last data filed at 08/20/16 0434  Gross per 24 hour  Intake                 0 ml  Output             1750 ml  Net            -1750 ml   Filed Weights   08/16/16 0107  Weight: 183 lb 12.8 oz (83.4 kg)    Physical Exam    GEN: Well nourished, well developed, in no acute distress, lying supine in bed, comfortable HEENT: Grossly normal.  Neck: Supple, obese, difficult to gauge jvp, no carotid bruits, or masses. Cardiac: RRR, no murmurs, rubs, or gallops. No clubbing, cyanosis, trace edema.   Respiratory:  Respirations regular and unlabored, diminished breath sounds in bilat bases w/ faint exp wheezing. GI: Soft, nontender, nondistended, BS + x 4. MS: no deformity or atrophy.R BKA. Skin: warm and dry, no rash. Neuro:  Strength and sensation are intact. Psych: AAOx3.  Normal affect.   Labs    Chemistry  Recent Labs Lab 08/15/16 2201 08/16/16 0221 08/19/16 0527  NA 139  --  140  K 4.2  --  3.9  CL 109  --  101  CO2 26  --  34*  GLUCOSE 339*  --  266*  BUN 30*  --  38*  CREATININE 1.73* 1.73* 1.72*  CALCIUM 8.1*  --  8.1*  GFRNONAA 40* 40* 40*  GFRAA 46* 46* 47*  ANIONGAP 4*  --  5     Hematology  Recent Labs Lab 08/15/16 2201 08/16/16 0221 08/17/16 0805  WBC 10.3 9.3  --   RBC 2.77* 2.47*  --   HGB 8.3* 7.5* 8.4*  HCT 24.9* 22.4*  --   MCV 90.0 90.7  --   MCH 29.9 30.4  --   MCHC 33.2 33.5  --   RDW 18.1* 17.8*  --   PLT 302 271  --     Cardiac Enzymes  Recent Labs Lab 08/15/16 2201 08/16/16 0221 08/16/16 0810  TROPONINI <0.03 <0.03 0.03*   Radiology    No results found.  Telemetry    Normal sinus rhythm without significant arrhythmia - Personally Reviewed  Cardiac Studies   2D Echocardiogram 5.10.2018  Study Conclusions  - Left ventricle: The cavity size was normal. Wall thickness was   increased in a pattern of severe LVH. Septum>posterior wall)   Systolic function was normal. The estimated ejection fraction was   in the range of 55% to 60%. Mild hypokinesis of the apical   myocardium.  Doppler parameters are consistent with abnormal left   ventricular relaxation (grade 1 diastolic dysfunction). - Aortic valve: There was moderate regurgitation. - Mitral valve: There was mild regurgitation. - Left atrium: The atrium was mildly dilated. - Pulmonary arteries: Systolic pressure was mildly to moderately   increased. PA peak pressure: 48 mm Hg (S). - Pericardium, extracardiac: A small pericardial effusion was   identified. There was a small left pleural effusion.  Patient Profile     66 y.o. 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  Assessment & Plan    1.  CAD/Atypical chest pain/elevated troponin:     Cont asa, statin,  blocker, plavix. No further ischemia workup at this time. No recurrent symptoms.  2.  Acute on chronic CHF (previously systolic, now diastolic):   exacerbated by anemia EF of 30-35% @ time of MI in 02/2016.   Echo on 08/16/16 shows normalization of LV.  Shortness of breath and dramatic weight gain consistent with - The presentation is consistent with acute on chronic diastolic heart failure. The patient was switched to furosemide 40 mg once daily. Renal function is stable. Continue blood pressure control and other cardiac medications. He was previously on furosemide 20 mg daily.  3.  CKD III:   Follow w/ diuresis.Stable after significant diuresis  4.  Anemia of chornic dzs:   s/p 2u prbc's  Unclear if he needs Epogen  5.  PAD:  s/p R BKA.  6.  HL:   Cont statin.  7. Large sacral decub ulcer On antibiotics, IV, PICC line in place  The patient is being discharged to rehabilitation. I will arrange for follow-up in our office in 2 weeks. I discussed with Dr. Allena Katz.   Signed, Lorine Bears, MD  08/20/2016, 8:30 AM

## 2016-08-20 NOTE — Progress Notes (Signed)
Report called to Fannie KneeSue at [facility] [place].

## 2016-08-20 NOTE — Discharge Summary (Signed)
[facility] - [place] at [facility]   PATIENT NAME: Damon [name]    MR#:  [id number]  DATE OF BIRTH:  Jul 26, 1950  DATE OF ADMISSION:  08/15/2016 ADMITTING PHYSICIAN: Tonye Royalty, DO  DATE OF DISCHARGE: 08/20/16  PRIMARY CARE PHYSICIAN: Emogene Morgan, MD    ADMISSION DIAGNOSIS:  Nausea [R11.0] Chest pain, unspecified type [R07.9]  DISCHARGE DIAGNOSIS:  Acute on chronic diastolic CHF Acute on Chronic anemia of chronic dz s/p BT Chronic Decubitus ulcer on IV abx (stop date 08/20/16) CKD-III SECONDARY DIAGNOSIS:   Past Medical History:  Diagnosis Date  . CAD (coronary artery disease)    a. 01/2016 MI/PCI: LM nl, LAD 134m/d (3.25 x 28 Xience DES), 40d, LCX 30ost, OM1 95 (staged - 2.75 x 18 Xience Alpine DES), OM2/3 min irregs, RCA 66m.  . Chronic combined systolic and diastolic CHF (congestive heart failure) (HCC)    a. 01/2016 Echo: Ef 25-30%, Gr1 DD, mild AI, mildly dil LA;  b. 02/2016 Echo: EF 30-35%, apical AK, antsept and ant HK, nl RV fxn.  . CKD (chronic kidney disease), stage III   . COPD (chronic obstructive pulmonary disease) (HCC)   . Diabetes mellitus without complication (HCC)   . Diverticulitis   . Essential hypertension   . Ischemic cardiomyopathy    a. 01/2016 Echo: Ef 25-30%;  b. 02/2016 Echo: EF 30-35%.  Marland Kitchen LBBB (left bundle branch block)   . PAD (peripheral artery disease) (HCC)    a. 10/2015 s/p PTA and stenting of the RCIA, LCIA, and PTA of the LEIA (Schnier); b. 04/2016 PTA or R tibioperoneal trunk & prox PT, PTA/DBA of R Pop and SFA, Viabahn covered stent x 2 to the R SFA and pop (5mm x 25 cm & 5mm x 15cm).  . Tobacco abuse     HOSPITAL COURSE:  66 y.o.malewith a history of CAD status post STEMI and an STEMI, CHF and ischemic cardiomyopathy, HTN, DMnow being admitted with:  1. Chest pain, ruled out ACS - negative troponins so far - Morphineprn, nitro, beta blocker, aspirin and statin ordered.  - Cardiology consult  Appreciated. No further cardiac workup recommended  2. H/O CAD s/p STEMI, NSTEMI - Continue Plavix, Lipitor  3. Acute on chronic diastolic CHF - recieved Lasix IV 80 mg twice a day (1 day)---now on Lasix 40 mg 3 times a day---lasix 40 mg daily -It seems patient's weight is about 15 lbs above since April 2018 -creat stable at 1.7 -Patient's urine output is negative by 12 L. He feels a whole lot better.  4. H/O HTN - Continue Coreg, hydralazine  5. H/O CKD, stable - Creatinine stable at 1.7 potassium stable  6. H/o Diabetes - Accuchecks achs with RISS coverage - Heart healthy, carb controlled diet - Continue Lantus  7. Anemia, chronic - Continue iron -hgb 8.3---7.5- 1 unit BT--8.1  8. History of GERD - Protonix  9. Chronic decubitus with Chronic foley (to allow wound healing) -cont IV unasyn thru 08/23/16 -Wound consult placed  D/c to [facility] center today. Pt appears at baseline.  CONSULTS OBTAINED:  Treatment Team:  Iran Ouch, MD Antonieta Iba, MD  DRUG ALLERGIES:  No Known Allergies  DISCHARGE MEDICATIONS:   Current Discharge Medication List    START taking these medications   Details  collagenase (SANTYL) ointment Apply topically daily. Qty: 15 g, Refills: 0    Multiple Vitamin (MULTIVITAMIN WITH MINERALS) TABS tablet Take 1 tablet by mouth daily. Qty: 30 tablet, Refills: 0  CONTINUE these medications which have CHANGED   Details  Ampicillin-Sulbactam 3 g in sodium chloride 0.9 % 100 mL Inject 3 g into the vein every 6 (six) hours. Qty: 300 g, Refills: 2    furosemide (LASIX) 40 MG tablet Take 1 tablet (40 mg total) by mouth daily. Qty: 30 tablet, Refills: 1      CONTINUE these medications which have NOT CHANGED   Details  acetaminophen (TYLENOL) 500 MG tablet Take 500 mg by mouth every 6 (six) hours as needed.    acidophilus (RISAQUAD) CAPS capsule Take 1 capsule by mouth daily.    albuterol-ipratropium (COMBIVENT) 18-103  MCG/ACT inhaler Inhale into the lungs every 4 (four) hours as needed for wheezing or shortness of breath.    Amino Acids-Protein Hydrolys (FEEDING SUPPLEMENT, PRO-STAT SUGAR FREE 64,) LIQD Take 30 mLs by mouth 3 (three) times daily with meals.    atorvastatin (LIPITOR) 80 MG tablet Take 1 tablet (80 mg total) by mouth daily at 6 PM. Qty: 30 tablet, Refills: 2    carvedilol (COREG) 12.5 MG tablet Take 1 tablet (12.5 mg total) by mouth 2 (two) times daily with a meal. Qty: 60 tablet, Refills: 30    clopidogrel (PLAVIX) 75 MG tablet Take 1 tablet (75 mg total) by mouth daily. Qty: 30 tablet, Refills: 0    ferrous sulfate 325 (65 FE) MG tablet Take 1 tablet (325 mg total) by mouth 3 (three) times daily with meals. Refills: 2    gabapentin (NEURONTIN) 300 MG capsule Take 300 mg by mouth daily as needed.     hydrALAZINE (APRESOLINE) 25 MG tablet Take 1 tablet (25 mg total) by mouth every 8 (eight) hours. Qty: 30 tablet, Refills: 0    HYDROcodone-acetaminophen (NORCO/VICODIN) 5-325 MG tablet Take 1 tablet by mouth every 6 (six) hours as needed for moderate pain. Qty: 30 tablet, Refills: 0    insulin glargine (LANTUS) 100 UNIT/ML injection Inject 0.1 mLs (10 Units total) into the skin at bedtime. Qty: 10 mL, Refills: 11    Melatonin 5 MG TABS Take 5 mg by mouth at bedtime as needed (sleep).    omeprazole (PRILOSEC) 20 MG capsule Take 20 mg by mouth daily.    ondansetron (ZOFRAN) 4 MG tablet Take 4 mg by mouth every 8 (eight) hours as needed for nausea or vomiting.    polyethylene glycol (MIRALAX / GLYCOLAX) packet Take 17 g by mouth daily.    sennosides-docusate sodium (SENOKOT-S) 8.6-50 MG tablet Take 1 tablet by mouth daily.    tamsulosin (FLOMAX) 0.4 MG CAPS capsule Take 1 capsule (0.4 mg total) by mouth daily. Qty: 30 capsule, Refills: 0    vitamin C (ASCORBIC ACID) 500 MG tablet Take 500 mg by mouth 2 (two) times daily.    feeding supplement, GLUCERNA SHAKE, (GLUCERNA SHAKE)  LIQD Take 237 mLs by mouth 3 (three) times daily between meals. Qty: 30 Can, Refills: 0        If you experience worsening of your admission symptoms, develop shortness of breath, life threatening emergency, suicidal or homicidal thoughts you must seek medical attention immediately by calling 911 or calling your MD immediately  if symptoms less severe.  You Must read complete instructions/literature along with all the possible adverse reactions/side effects for all the Medicines you take and that have been prescribed to you. Take any new Medicines after you have completely understood and accept all the possible adverse reactions/side effects.   Please note  You were cared for by a hospitalist  during your hospital stay. If you have any questions about your discharge medications or the care you received while you were in the hospital after you are discharged, you can call the unit and asked to speak with the hospitalist on call if the hospitalist that took care of you is not available. Once you are discharged, your primary care physician will handle any further medical issues. Please note that NO REFILLS for any discharge medications will be authorized once you are discharged, as it is imperative that you return to your primary care physician (or establish a relationship with a primary care physician if you do not have one) for your aftercare needs so that they can reassess your need for medications and monitor your lab values. Today   SUBJECTIVE   No new complaints  VITAL SIGNS:  Blood pressure (!) 145/64, pulse 74, temperature 98.6 F (37 C), temperature source Oral, resp. rate 18, height 5\' 9"  (1.753 m), weight 83.4 kg (183 lb 12.8 oz), SpO2 100 %.  I/O:   Intake/Output Summary (Last 24 hours) at 08/20/16 0803 Last data filed at 08/20/16 0434  Gross per 24 hour  Intake                0 ml  Output             1750 ml  Net            -1750 ml    PHYSICAL EXAMINATION:  GENERAL:  66  y.o.-year-old patient lying in the bed with no acute distress. Chronically ill EYES: Pupils equal, round, reactive to light and accommodation. No scleral icterus. Extraocular muscles intact.  HEENT: Head atraumatic, normocephalic. Oropharynx and nasopharynx clear.  NECK:  Supple, no jugular venous distention. No thyroid enlargement, no tenderness.  LUNGS: Normal breath sounds bilaterally, no wheezing, rales,rhonchi or crepitation. No use of accessory muscles of respiration.  CARDIOVASCULAR: S1, S2 normal. No murmurs, rubs, or gallops.  ABDOMEN: Soft, non-tender, non-distended. Bowel sounds present. No organomegaly or mass. Chronic foley EXTREMITIES: No pedal edema, cyanosis, or clubbing.  NEUROLOGIC: Cranial nerves II through XII are intact. Muscle strength 5/5 in all extremities. Sensation intact. Gait not checked.  PSYCHIATRIC: The patient is alert and oriented x 3.  SKIN: chronic decubitus   DATA REVIEW:   CBC   Recent Labs Lab 08/16/16 0221 08/17/16 0805  WBC 9.3  --   HGB 7.5* 8.4*  HCT 22.4*  --   PLT 271  --     Chemistries   Recent Labs Lab 08/19/16 0527  NA 140  K 3.9  CL 101  CO2 34*  GLUCOSE 266*  BUN 38*  CREATININE 1.72*  CALCIUM 8.1*    Microbiology Results   Recent Results (from the past 240 hour(s))  MRSA PCR Screening     Status: None   Collection Time: 08/16/16  2:21 AM  Result Value Ref Range Status   MRSA by PCR NEGATIVE NEGATIVE Final    Comment:        The GeneXpert MRSA Assay (FDA approved for NASAL specimens only), is one component of a comprehensive MRSA colonization surveillance program. It is not intended to diagnose MRSA infection nor to guide or monitor treatment for MRSA infections.     RADIOLOGY:  No results found.   Management plans discussed with the patient, family and they are in agreement.  CODE STATUS:     Code Status Orders        Start  Ordered   08/16/16 0113  Full code  Continuous     08/16/16 0112     Code Status History    Date Active Date Inactive Code Status Order ID Comments User Context   07/08/2016  1:24 AM 07/20/2016  6:55 PM Full Code [id number]  Oralia Manis, MD Inpatient   06/01/2016  3:41 PM 06/11/2016  9:14 PM Full Code [id number]  Adrian Saran, MD ED   05/03/2016 10:54 PM 05/11/2016  6:08 PM Full Code [id number]  Enedina Finner, MD Inpatient   03/27/2016  7:34 PM 03/29/2016  5:47 PM Full Code [id number]  Enedina Finner, MD Inpatient   02/14/2016  6:10 PM 02/15/2016  5:03 PM Full Code [id number]  Alford Highland, MD ED   01/13/2016  9:42 AM 01/17/2016 11:47 PM Full Code [id number]  Eugenie Norrie, NP ED      TOTAL TIME TAKING CARE OF THIS PATIENT: *40* minutes.    Delorus Langwell M.D on 08/20/2016 at 8:03 AM  Between 7am to 6pm - Pager - [phone] After 6pm go to [email or web address] - Social research officer, government  [facility]  Office  [phone]  CC: Primary care physician; Emogene Morgan, MD

## 2016-08-20 NOTE — Progress Notes (Addendum)
EMS arrived to transport [facility] to [facility]. Patient discharged to SNF per MD order and hospital protocol. Patient's prescriptions were in the packet. Patient had his glasses, cell phone and charger and wedding band on when he left the unit. EMS received patient's packet.

## 2016-08-20 NOTE — Progress Notes (Signed)
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

## 2016-08-20 NOTE — Clinical Social Work Note (Signed)
Pt is ready for discharge today and will return to [facility]. An new LOG has been approved. Pt and pt's wife are aware and agreeable to discharge plan. Facility is ready to admit pt as they have received discharge information. RN will call report. [facility] will provide transportation. CSW is signing off as no further needs identified.   Dede QuerySarah Eureka Valdes, MSW, LCSW  Clinical Social Worker  [phone]

## 2016-08-20 NOTE — Progress Notes (Signed)
EMS called for patient transport.

## 2016-08-20 NOTE — Telephone Encounter (Signed)
Pt has 5/29,  2:20 appt w/ Dr. Kirke CorinArida  Pt currently admitted.

## 2016-08-21 NOTE — Telephone Encounter (Signed)
Attempted TCM call. Left message on machine for patient to contact the office.   

## 2016-08-22 NOTE — Telephone Encounter (Signed)
Left message on machine for patient to contact the office.   

## 2016-08-24 NOTE — Telephone Encounter (Signed)
Left message on machine for patient to contact the office.   

## 2016-09-04 ENCOUNTER — Encounter: Payer: Self-pay | Admitting: Cardiovascular Disease

## 2016-09-04 ENCOUNTER — Ambulatory Visit (INDEPENDENT_AMBULATORY_CARE_PROVIDER_SITE_OTHER): Payer: Commercial Managed Care - PPO | Admitting: Cardiovascular Disease

## 2016-09-04 VITALS — BP 144/60 | HR 80 | Ht 69.0 in | Wt 176.0 lb

## 2016-09-04 DIAGNOSIS — I2510 Atherosclerotic heart disease of native coronary artery without angina pectoris: Secondary | ICD-10-CM | POA: Diagnosis not present

## 2016-09-04 DIAGNOSIS — I10 Essential (primary) hypertension: Secondary | ICD-10-CM

## 2016-09-04 DIAGNOSIS — E785 Hyperlipidemia, unspecified: Secondary | ICD-10-CM | POA: Diagnosis not present

## 2016-09-04 DIAGNOSIS — I739 Peripheral vascular disease, unspecified: Secondary | ICD-10-CM | POA: Diagnosis not present

## 2016-09-04 DIAGNOSIS — I5022 Chronic systolic (congestive) heart failure: Secondary | ICD-10-CM

## 2016-09-04 NOTE — Patient Instructions (Signed)
Medication Instructions: Continue same medications.   Labwork: NONE.   Procedures/Testing: None.   Follow-Up: 4 months with Dr. Kirke CorinArida.   Any Additional Special Instructions Will Be Listed Below (If Applicable).     If you need a refill on your cardiac medications before your next appointment, please call your pharmacy.

## 2016-09-04 NOTE — Progress Notes (Signed)
Cardiology Office Note   Date:  09/04/2016   ID:  Damon [name], DOB 08/11/1950, MRN [id number]  PCP:  Emogene Morgan, MD  Cardiologist:   Lorine Bears, MD   Chief Complaint  Patient presents with  . Other    F/u hospital. Patient denies chest pain and SOB. Meds reviewed verbally with pt.      History of Present Illness: Damon [name] is a 66 y.o. male who presents for A follow-up visit regarding coronary artery disease and chronic systolic heart failure due to ischemic cardiomyopathy. He presented in October with subacute anterior wall MI in the setting of a new LBBB and progressive dyspnea requiring intubation. Emergent cardiac catheterization showed occluded LAD which was stented. He underwent staged PCI of a large OM branch. The right coronary artery was medium in size and codominant. There was significant disease in the midsegment that was left to be treated medically. Ejection fraction was 25-30%.  Also with history of PAD s/p stenting, CKD stage III, DM, HTN, and tobacco abuse. He is status post right below the knee amputation due to severe peripheral arterial disease.  He had multiple hospitalizations over the last few months mostly related to peripheral arterial disease and critical limb ischemia. He had right below the knee amputation. He was hospitalized recently with atypical chest pain and mostly volume overload. He underwent an echocardiogram which showed normalization of LV systolic function with an EF of 55-60% with mild apical hypokinesis, moderate aortic regurgitation and mild-to-moderate pulmonary hypertension with peak systolic pressure of 48 mmHg. The patient was discharged on furosemide 40 mg once daily. He has been doing well overall with no recent chest pain for worsening dyspnea. He is currently in a skilled nursing facility at [facility]. He continues to have sacral decubitus ulcers and has an indwelling Foley catheter.  Past Medical History:  Diagnosis Date    . CAD (coronary artery disease)    a. 01/2016 MI/PCI: LM nl, LAD 144m/d (3.25 x 28 Xience DES), 40d, LCX 30ost, OM1 95 (staged - 2.75 x 18 Xience Alpine DES), OM2/3 min irregs, RCA 50m.  . Chronic combined systolic and diastolic CHF (congestive heart failure) (HCC)    a. 01/2016 Echo: Ef 25-30%, Gr1 DD, mild AI, mildly dil LA;  b. 02/2016 Echo: EF 30-35%, apical AK, antsept and ant HK, nl RV fxn.  . CKD (chronic kidney disease), stage III   . COPD (chronic obstructive pulmonary disease) (HCC)   . Diabetes mellitus without complication (HCC)   . Diverticulitis   . Essential hypertension   . Ischemic cardiomyopathy    a. 01/2016 Echo: Ef 25-30%;  b. 02/2016 Echo: EF 30-35%.  Marland Kitchen LBBB (left bundle branch block)   . PAD (peripheral artery disease) (HCC)    a. 10/2015 s/p PTA and stenting of the RCIA, LCIA, and PTA of the LEIA (Schnier); b. 04/2016 PTA or R tibioperoneal trunk & prox PT, PTA/DBA of R Pop and SFA, Viabahn covered stent x 2 to the R SFA and pop (5mm x 25 cm & 5mm x 15cm).  . Tobacco abuse     Past Surgical History:  Procedure Laterality Date  . AMPUTATION Right 06/06/2016   Procedure: AMPUTATION BELOW KNEE;  Surgeon: Annice Needy, MD;  Location: [facility] ORS;  Service: General;  Laterality: Right;  . CARDIAC CATHETERIZATION N/A 01/13/2016   Procedure: LEFT HEART CATH AND CORONARY ANGIOGRAPHY;  Surgeon: Iran Ouch, MD;  Location: [facility] INVASIVE CV LAB;  Service:  Cardiovascular;  Laterality: N/A;  . CARDIAC CATHETERIZATION N/A 01/13/2016   Procedure: Coronary Stent Intervention;  Surgeon: Iran Ouch, MD;  Location: [facility] INVASIVE CV LAB;  Service: Cardiovascular;  Laterality: N/A;  . CARDIAC CATHETERIZATION N/A 01/16/2016   Procedure: Left Heart Cath and Coronary Angiography;  Surgeon: Iran Ouch, MD;  Location: [facility] INVASIVE CV LAB;  Service: Cardiovascular;  Laterality: N/A;  . INCISION AND DRAINAGE ABSCESS N/A 07/12/2016   Procedure: INCISION AND DRAINAGE GLUTEAL  ABSCESS;   Surgeon: Leafy Ro, MD;  Location: [facility] ORS;  Service: General;  Laterality: N/A;  . IRRIGATION AND DEBRIDEMENT FOOT Right 05/08/2016   Procedure: IRRIGATION AND DEBRIDEMENT FOOT WITH PLACEMENT OF ANTIBIOTIC BEADS;  Surgeon: Linus Galas, DPM;  Location: [facility] ORS;  Service: Podiatry;  Laterality: Right;  . NECK SURGERY    . PERIPHERAL VASCULAR CATHETERIZATION Left 11/01/2015   Procedure: Lower Extremity Angiography;  Surgeon: Renford Dills, MD;  Location: [facility] INVASIVE CV LAB;  Service: Cardiovascular;  Laterality: Left;  . PERIPHERAL VASCULAR CATHETERIZATION Right 05/07/2016   Procedure: Lower Extremity Angiography;  Surgeon: Annice Needy, MD;  Location: [facility] INVASIVE CV LAB;  Service: Cardiovascular;  Laterality: Right;     Current Outpatient Prescriptions  Medication Sig Dispense Refill  . acetaminophen (TYLENOL) 500 MG tablet Take 500 mg by mouth every 6 (six) hours as needed.    Marland Kitchen acidophilus (RISAQUAD) CAPS capsule Take 1 capsule by mouth daily.    Marland Kitchen albuterol-ipratropium (COMBIVENT) 18-103 MCG/ACT inhaler Inhale into the lungs every 4 (four) hours as needed for wheezing or shortness of breath.    . Amino Acids-Protein Hydrolys (FEEDING SUPPLEMENT, PRO-STAT SUGAR FREE 64,) LIQD Take 30 mLs by mouth 3 (three) times daily with meals.    . Ampicillin-Sulbactam 3 g in sodium chloride 0.9 % 100 mL Inject 3 g into the vein every 6 (six) hours. 300 g 2  . atorvastatin (LIPITOR) 80 MG tablet Take 1 tablet (80 mg total) by mouth daily at 6 PM. 30 tablet 2  . carvedilol (COREG) 12.5 MG tablet Take 1 tablet (12.5 mg total) by mouth 2 (two) times daily with a meal. 60 tablet 30  . clopidogrel (PLAVIX) 75 MG tablet Take 1 tablet (75 mg total) by mouth daily. 30 tablet 0  . collagenase (SANTYL) ointment Apply topically daily. 15 g 0  . feeding supplement, GLUCERNA SHAKE, (GLUCERNA SHAKE) LIQD Take 237 mLs by mouth 3 (three) times daily between meals. 30 Can 0  . ferrous sulfate 325 (65 FE) MG tablet  Take 1 tablet (325 mg total) by mouth 3 (three) times daily with meals.  2  . furosemide (LASIX) 40 MG tablet Take 1 tablet (40 mg total) by mouth daily. 30 tablet 1  . gabapentin (NEURONTIN) 300 MG capsule Take 300 mg by mouth daily as needed.     . hydrALAZINE (APRESOLINE) 25 MG tablet Take 1 tablet (25 mg total) by mouth every 8 (eight) hours. 30 tablet 0  . HYDROcodone-acetaminophen (NORCO/VICODIN) 5-325 MG tablet Take 1 tablet by mouth every 6 (six) hours as needed for moderate pain. 30 tablet 0  . insulin glargine (LANTUS) 100 UNIT/ML injection Inject 0.1 mLs (10 Units total) into the skin at bedtime. 10 mL 11  . Melatonin 5 MG TABS Take 5 mg by mouth at bedtime as needed (sleep).    . Multiple Vitamin (MULTIVITAMIN WITH MINERALS) TABS tablet Take 1 tablet by mouth daily. 30 tablet 0  . omeprazole (PRILOSEC) 20 MG capsule Take  20 mg by mouth daily.    . ondansetron (ZOFRAN) 4 MG tablet Take 4 mg by mouth every 8 (eight) hours as needed for nausea or vomiting.    . polyethylene glycol (MIRALAX / GLYCOLAX) packet Take 17 g by mouth daily.    . sennosides-docusate sodium (SENOKOT-S) 8.6-50 MG tablet Take 1 tablet by mouth daily.    . tamsulosin (FLOMAX) 0.4 MG CAPS capsule Take 1 capsule (0.4 mg total) by mouth daily. 30 capsule 0  . vitamin C (ASCORBIC ACID) 500 MG tablet Take 500 mg by mouth 2 (two) times daily.     No current facility-administered medications for this visit.     Allergies:   Patient has no known allergies.    Social History:  The patient  reports that he has been smoking Cigarettes.  He has a 7.50 pack-year smoking history. He uses smokeless tobacco. He reports that he does not drink alcohol or use drugs.   Family History:  The patient's family history includes Cancer in his mother; Diabetes in his father and mother; Intracerebral hemorrhage in his mother.    ROS:  Please see the history of present illness.   Otherwise, review of systems are positive for none.   All  other systems are reviewed and negative.    PHYSICAL EXAM: VS:  BP (!) 144/60 (BP Location: Right Arm, Patient Position: Sitting, Cuff Size: Normal)   Pulse 80   Ht 5\' 9"  (1.753 m)   Wt 176 lb (79.8 kg)   BMI 25.99 kg/m  , BMI Body mass index is 25.99 kg/m. GEN: Well nourished, well developed, in no acute distress  HEENT: normal  Neck: no JVD, carotid bruits, or masses Cardiac: RRR; no murmurs, rubs, or gallops,no edema  Respiratory:  clear to auscultation bilaterally, normal work of breathing GI: soft, nontender, nondistended, + BS MS: no deformity or atrophy  Skin: warm and dry, no rash Neuro:  Strength and sensation are intact Psych: euthymic mood, full affect   EKG:  EKG is ordered today.  EKG showed normal sinus rhythm with left anterior fascicular block and old anterior infarct.  Recent Labs: 03/27/2016: B Natriuretic Peptide 503.0 07/10/2016: Magnesium 1.8 07/16/2016: ALT 46 08/16/2016: Platelets 271 08/17/2016: Hemoglobin 8.4 08/19/2016: BUN 38; Creatinine, Ser 1.72; Potassium 3.9; Sodium 140    Lipid Panel    Component Value Date/Time   CHOL 94 02/15/2016 0552   TRIG 64 02/15/2016 0552   HDL 23 (L) 02/15/2016 0552   CHOLHDL 4.1 02/15/2016 0552   VLDL 13 02/15/2016 0552   LDLCALC 58 02/15/2016 0552      Wt Readings from Last 3 Encounters:  09/04/16 176 lb (79.8 kg)  08/16/16 183 lb 12.8 oz (83.4 kg)  07/20/16 172 lb 8 oz (78.2 kg)       No flowsheet data found.    ASSESSMENT AND PLAN:  1.  Coronary artery disease involving native coronary arteries without angina: Currently with no anginal symptoms. He was [facility] to Plavix during one of his admissions due to anemia. He is currently stable.  2. Chronic systolic heart failure:  Most recent echocardiogram showed normalization of ejection fraction. Continue treatment with carvedilol. He is not on an ACE inhibitor or ARB due to chronic kidney disease.  3. Peripheral arterial disease:  Followed by vascular surgery.  4. Tobacco use: The patient has quit smoking since his last visit.  5. Hyperlipidemia: Continue high dose atorvastatin. Target LDL is less than 70.   Disposition:  FU with me in 4 months  Signed,  Lorine Bears, MD  09/04/2016 3:02 PM    [facility]

## 2016-09-13 ENCOUNTER — Telehealth: Payer: Self-pay | Admitting: Cardiovascular Disease

## 2016-09-13 NOTE — Telephone Encounter (Signed)
Received records request from Disability [employer] Services , forwarded to [employer] for processing. ° °

## 2017-01-04 ENCOUNTER — Ambulatory Visit: Payer: Commercial Managed Care - PPO | Admitting: Cardiovascular Disease

## 2017-01-04 NOTE — Progress Notes (Deleted)
Cardiology Office Note   Date:  01/04/2017   ID:  LONG BRIMAGE, DOB 1951-01-08, MRN [id number]  PCP:  Emogene Morgan, MD  Cardiologist:   Lorine Bears, MD   No chief complaint on file.     History of Present Illness: Damon [name] is a 66 y.o. male who presents for a follow-up visit regarding coronary artery disease and chronic systolic heart failure due to ischemic cardiomyopathy. He presented in October, 2017 with subacute anterior wall MI in the setting of a new LBBB and progressive dyspnea requiring intubation. Emergent cardiac catheterization showed occluded LAD which was stented. He underwent staged PCI of a large OM branch. The right coronary artery was medium in size and codominant. There was significant disease in the midsegment that was left to be treated medically. Ejection fraction was 25-30%.  Also with history of PAD s/p stenting, CKD stage III, DM, HTN, and tobacco abuse. He is status post right below the knee amputation due to severe peripheral arterial disease.  Most recent echocardiogram in May 2008 showed normal LV systolic function with hypokinesis of the apical myocardium. There was moderate aortic regurgitation, mild mitral regurgitation and mildly to moderately elevated systolic pulmonary pressure at 48 mmHg.   Past Medical History:  Diagnosis Date  . CAD (coronary artery disease)    a. 01/2016 MI/PCI: LM nl, LAD 125m/d (3.25 x 28 Xience DES), 40d, LCX 30ost, OM1 95 (staged - 2.75 x 18 Xience Alpine DES), OM2/3 min irregs, RCA 71m.  . Chronic combined systolic and diastolic CHF (congestive heart failure) (HCC)    a. 01/2016 Echo: Ef 25-30%, Gr1 DD, mild AI, mildly dil LA;  b. 02/2016 Echo: EF 30-35%, apical AK, antsept and ant HK, nl RV fxn.  . CKD (chronic kidney disease), stage III   . COPD (chronic obstructive pulmonary disease) (HCC)   . Diabetes mellitus without complication (HCC)   . Diverticulitis   . Essential hypertension   . Ischemic  cardiomyopathy    a. 01/2016 Echo: Ef 25-30%;  b. 02/2016 Echo: EF 30-35%.  Marland Kitchen LBBB (left bundle branch block)   . PAD (peripheral artery disease) (HCC)    a. 10/2015 s/p PTA and stenting of the RCIA, LCIA, and PTA of the LEIA (Schnier); b. 04/2016 PTA or R tibioperoneal trunk & prox PT, PTA/DBA of R Pop and SFA, Viabahn covered stent x 2 to the R SFA and pop (5mm x 25 cm & 5mm x 15cm).  . Tobacco abuse     Past Surgical History:  Procedure Laterality Date  . AMPUTATION Right 06/06/2016   Procedure: AMPUTATION BELOW KNEE;  Surgeon: Annice Needy, MD;  Location: [facility] ORS;  Service: General;  Laterality: Right;  . CARDIAC CATHETERIZATION N/A 01/13/2016   Procedure: LEFT HEART CATH AND CORONARY ANGIOGRAPHY;  Surgeon: Iran Ouch, MD;  Location: [facility] INVASIVE CV LAB;  Service: Cardiovascular;  Laterality: N/A;  . CARDIAC CATHETERIZATION N/A 01/13/2016   Procedure: Coronary Stent Intervention;  Surgeon: Iran Ouch, MD;  Location: [facility] INVASIVE CV LAB;  Service: Cardiovascular;  Laterality: N/A;  . CARDIAC CATHETERIZATION N/A 01/16/2016   Procedure: Left Heart Cath and Coronary Angiography;  Surgeon: Iran Ouch, MD;  Location: [facility] INVASIVE CV LAB;  Service: Cardiovascular;  Laterality: N/A;  . INCISION AND DRAINAGE ABSCESS N/A 07/12/2016   Procedure: INCISION AND DRAINAGE GLUTEAL  ABSCESS;  Surgeon: Leafy Ro, MD;  Location: [facility] ORS;  Service: General;  Laterality: N/A;  . IRRIGATION AND  DEBRIDEMENT FOOT Right 05/08/2016   Procedure: IRRIGATION AND DEBRIDEMENT FOOT WITH PLACEMENT OF ANTIBIOTIC BEADS;  Surgeon: Linus Galas, DPM;  Location: [facility] ORS;  Service: Podiatry;  Laterality: Right;  . NECK SURGERY    . PERIPHERAL VASCULAR CATHETERIZATION Left 11/01/2015   Procedure: Lower Extremity Angiography;  Surgeon: Renford Dills, MD;  Location: [facility] INVASIVE CV LAB;  Service: Cardiovascular;  Laterality: Left;  . PERIPHERAL VASCULAR CATHETERIZATION Right 05/07/2016   Procedure: Lower  Extremity Angiography;  Surgeon: Annice Needy, MD;  Location: [facility] INVASIVE CV LAB;  Service: Cardiovascular;  Laterality: Right;     Current Outpatient Prescriptions  Medication Sig Dispense Refill  . acetaminophen (TYLENOL) 500 MG tablet Take 500 mg by mouth every 6 (six) hours as needed.    Marland Kitchen acidophilus (RISAQUAD) CAPS capsule Take 1 capsule by mouth daily.    Marland Kitchen albuterol-ipratropium (COMBIVENT) 18-103 MCG/ACT inhaler Inhale into the lungs every 4 (four) hours as needed for wheezing or shortness of breath.    . Amino Acids-Protein Hydrolys (FEEDING SUPPLEMENT, PRO-STAT SUGAR FREE 64,) LIQD Take 30 mLs by mouth 3 (three) times daily with meals.    Marland Kitchen atorvastatin (LIPITOR) 80 MG tablet Take 1 tablet (80 mg total) by mouth daily at 6 PM. 30 tablet 2  . carvedilol (COREG) 12.5 MG tablet Take 1 tablet (12.5 mg total) by mouth 2 (two) times daily with a meal. 60 tablet 30  . clopidogrel (PLAVIX) 75 MG tablet Take 1 tablet (75 mg total) by mouth daily. 30 tablet 0  . collagenase (SANTYL) ointment Apply topically daily. 15 g 0  . feeding supplement, GLUCERNA SHAKE, (GLUCERNA SHAKE) LIQD Take 237 mLs by mouth 3 (three) times daily between meals. 30 Can 0  . ferrous sulfate 325 (65 FE) MG tablet Take 1 tablet (325 mg total) by mouth 3 (three) times daily with meals.  2  . furosemide (LASIX) 40 MG tablet Take 1 tablet (40 mg total) by mouth daily. 30 tablet 1  . gabapentin (NEURONTIN) 300 MG capsule Take 300 mg by mouth daily as needed.     . hydrALAZINE (APRESOLINE) 25 MG tablet Take 1 tablet (25 mg total) by mouth every 8 (eight) hours. 30 tablet 0  . HYDROcodone-acetaminophen (NORCO/VICODIN) 5-325 MG tablet Take 1 tablet by mouth every 6 (six) hours as needed for moderate pain. 30 tablet 0  . insulin glargine (LANTUS) 100 UNIT/ML injection Inject 0.1 mLs (10 Units total) into the skin at bedtime. 10 mL 11  . Melatonin 5 MG TABS Take 5 mg by mouth at bedtime as needed (sleep).    . Multiple Vitamin  (MULTIVITAMIN WITH MINERALS) TABS tablet Take 1 tablet by mouth daily. 30 tablet 0  . omeprazole (PRILOSEC) 20 MG capsule Take 20 mg by mouth daily.    . ondansetron (ZOFRAN) 4 MG tablet Take 4 mg by mouth every 8 (eight) hours as needed for nausea or vomiting.    . polyethylene glycol (MIRALAX / GLYCOLAX) packet Take 17 g by mouth daily.    . sennosides-docusate sodium (SENOKOT-S) 8.6-50 MG tablet Take 1 tablet by mouth daily.    . tamsulosin (FLOMAX) 0.4 MG CAPS capsule Take 1 capsule (0.4 mg total) by mouth daily. 30 capsule 0  . vitamin C (ASCORBIC ACID) 500 MG tablet Take 500 mg by mouth 2 (two) times daily.     No current facility-administered medications for this visit.     Allergies:   Patient has no known allergies.    Social History:  The patient  reports that he has been smoking Cigarettes.  He has a 7.50 pack-year smoking history. He uses smokeless tobacco. He reports that he does not drink alcohol or use drugs.   Family History:  The patient's family history includes Cancer in his mother; Diabetes in his father and mother; Intracerebral hemorrhage in his mother.    ROS:  Please see the history of present illness.   Otherwise, review of systems are positive for none.   All other systems are reviewed and negative.    PHYSICAL EXAM: VS:  There were no vitals taken for this visit. , BMI There is no height or weight on file to calculate BMI. GEN: Well nourished, well developed, in no acute distress  HEENT: normal  Neck: no JVD, carotid bruits, or masses Cardiac: RRR; no murmurs, rubs, or gallops,no edema  Respiratory:  clear to auscultation bilaterally, normal work of breathing GI: soft, nontender, nondistended, + BS MS: no deformity or atrophy  Skin: warm and dry, no rash Neuro:  Strength and sensation are intact Psych: euthymic mood, full affect   EKG:  EKG is ordered today.  EKG showed normal sinus rhythm with left anterior fascicular block and old anterior  infarct.  Recent Labs: 03/27/2016: B Natriuretic Peptide 503.0 07/10/2016: Magnesium 1.8 07/16/2016: ALT 46 08/16/2016: Platelets 271 08/17/2016: Hemoglobin 8.4 08/19/2016: BUN 38; Creatinine, Ser 1.72; Potassium 3.9; Sodium 140    Lipid Panel    Component Value Date/Time   CHOL 94 02/15/2016 0552   TRIG 64 02/15/2016 0552   HDL 23 (L) 02/15/2016 0552   CHOLHDL 4.1 02/15/2016 0552   VLDL 13 02/15/2016 0552   LDLCALC 58 02/15/2016 0552      Wt Readings from Last 3 Encounters:  09/04/16 176 lb (79.8 kg)  08/16/16 183 lb 12.8 oz (83.4 kg)  07/20/16 172 lb 8 oz (78.2 kg)       No flowsheet data found.    ASSESSMENT AND PLAN:  1.  Coronary artery disease involving native coronary arteries without angina: Currently with no anginal symptoms. He was switched from [facility] to Plavix during one of his admissions due to anemia. He is currently stable.  2. Chronic systolic heart failure:  Most recent echocardiogram showed normalization of ejection fraction. Continue treatment with carvedilol. He is not on an ACE inhibitor or ARB due to chronic kidney disease.  3. Peripheral arterial disease: Followed by vascular surgery.  4. Tobacco use: The patient has quit smoking since his last visit.  5. Hyperlipidemia: Continue high dose atorvastatin. Target LDL is less than 70.   Disposition:   FU with me in 4 months  Signed,  Lorine Bears, MD  01/04/2017 10:46 AM    [facility]

## 2017-01-07 ENCOUNTER — Encounter: Payer: Self-pay | Admitting: Cardiovascular Disease

## 2017-01-09 ENCOUNTER — Encounter (INDEPENDENT_AMBULATORY_CARE_PROVIDER_SITE_OTHER): Payer: Self-pay

## 2017-01-09 ENCOUNTER — Ambulatory Visit (INDEPENDENT_AMBULATORY_CARE_PROVIDER_SITE_OTHER): Payer: Commercial Managed Care - PPO | Admitting: Vascular Surgery

## 2017-01-09 ENCOUNTER — Other Ambulatory Visit: Payer: Self-pay | Admitting: Family Medicine

## 2017-01-09 ENCOUNTER — Other Ambulatory Visit (INDEPENDENT_AMBULATORY_CARE_PROVIDER_SITE_OTHER): Payer: Commercial Managed Care - PPO

## 2017-01-09 ENCOUNTER — Other Ambulatory Visit (INDEPENDENT_AMBULATORY_CARE_PROVIDER_SITE_OTHER): Payer: Self-pay | Admitting: Vascular Surgery

## 2017-01-09 ENCOUNTER — Encounter (INDEPENDENT_AMBULATORY_CARE_PROVIDER_SITE_OTHER): Payer: Self-pay | Admitting: Vascular Surgery

## 2017-01-09 VITALS — BP 114/63 | HR 59 | Resp 15 | Ht 70.0 in | Wt 147.0 lb

## 2017-01-09 DIAGNOSIS — E118 Type 2 diabetes mellitus with unspecified complications: Secondary | ICD-10-CM

## 2017-01-09 DIAGNOSIS — I739 Peripheral vascular disease, unspecified: Secondary | ICD-10-CM

## 2017-01-09 DIAGNOSIS — E782 Mixed hyperlipidemia: Secondary | ICD-10-CM

## 2017-01-09 DIAGNOSIS — Z89511 Acquired absence of right leg below knee: Secondary | ICD-10-CM

## 2017-01-09 DIAGNOSIS — Z794 Long term (current) use of insulin: Secondary | ICD-10-CM | POA: Diagnosis not present

## 2017-01-09 DIAGNOSIS — N63 Unspecified lump in unspecified breast: Secondary | ICD-10-CM

## 2017-01-09 NOTE — Progress Notes (Signed)
Subjective:    Patient ID: Damon [name], male    DOB: [birth year], 66 y.o.   MRN: [id number] Chief Complaint  Patient presents with  . Follow-up    6 month u/s follow up   Patient presents for a peripheral artery disease follow-up. He was last seen on 07/04/2016. Patient has a history of a right BKA. He denies any issues with his stump. States he was evaluated for a prosthetic and is in the process of receiving one. Patient with a chief complaint of left lower extremity pain. Patient ambulates minimally however when he does he experiences right calf pain. This calf pain is progressively worsening over the last few months. His claudication distance has shortened. He denies any ulceration to the lower extremity. It does sound like he is starting to experience rest pain symptoms as he wakes up in the middle of the night with left calf "cramping". The patient underwent an ABI today which was notable  Severe left lower extremity arterial disease. When compared to the previous ABI there has been a significant decrease in the left ABI. Patient with monophasic tibials. Patient denies any fever, nausea or vomiting.    Review of Systems  Constitutional: Negative.   HENT: Negative.   Eyes: Negative.   Respiratory: Negative.   Cardiovascular:       Left lower extremity pain History of a right BKA  Gastrointestinal: Negative.   Endocrine: Negative.   Genitourinary: Negative.   Musculoskeletal: Negative.   Skin: Negative.   Allergic/Immunologic: Negative.   Neurological: Negative.   Hematological: Negative.   Psychiatric/Behavioral: Negative.       Objective:   Physical Exam  Constitutional: He is oriented to person, place, and time. He appears well-developed and well-nourished. No distress.  HENT:  Head: Normocephalic and atraumatic.  Eyes: Pupils are equal, round, and reactive to light. Conjunctivae are normal.  Neck: Normal range of motion.  Cardiovascular: Normal rate, regular rhythm,  normal heart sounds and intact distal pulses.   Pulses:      Radial pulses are 2+ on the right side, and 2+ on the left side.  Right BKA stump: healing well Unable to palpate pedal pulses to the left lower extremity.  Pulmonary/Chest: Effort normal.  Musculoskeletal: Normal range of motion. He exhibits no edema.  Neurological: He is alert and oriented to person, place, and time.  Skin: Skin is warm and dry. He is not diaphoretic.  No ulcerations to the right BKA stump or the left lower extremity  Psychiatric: He has a normal mood and affect. His behavior is normal. Judgment and thought content normal.  Vitals reviewed.  BP 114/63 (BP Location: Right Arm)   Pulse (!) 59   Resp 15   Ht  (1.778 m)   Wt 147 lb (66.7 kg)   BMI 21.09 kg/m   Past Medical History:  Diagnosis Date  . CAD (coronary artery disease)    a. 01/2016 MI/PCI: LM nl, LAD 178m/d (3.25 x 28 Xience DES), 40d, LCX 30ost, OM1 95 (staged - 2.75 x 18 Xience Alpine DES), OM2/3 min irregs, RCA 76m.  . Chronic combined systolic and diastolic CHF (congestive heart failure) (HCC)    a. 01/2016 Echo: Ef 25-30%, Gr1 DD, mild AI, mildly dil LA;  b. 02/2016 Echo: EF 30-35%, apical AK, antsept and ant HK, nl RV fxn.  . CKD (chronic kidney disease), stage III (HCC)   . COPD (chronic obstructive pulmonary disease) (HCC)   . Diabetes mellitus without complication (  HCC)   . Diverticulitis   . Essential hypertension   . Ischemic cardiomyopathy    a. 01/2016 Echo: Ef 25-30%;  b. 02/2016 Echo: EF 30-35%.  Marland Kitchen LBBB (left bundle branch block)   . PAD (peripheral artery disease) (HCC)    a. 10/2015 s/p PTA and stenting of the RCIA, LCIA, and PTA of the LEIA (Schnier); b. 04/2016 PTA or R tibioperoneal trunk & prox PT, PTA/DBA of R Pop and SFA, Viabahn covered stent x 2 to the R SFA and pop (5mm x 25 cm & 5mm x 15cm).  . Tobacco abuse    Social History   Social History  . Marital status: Married    Spouse name: N/A  . Number of  children: N/A  . Years of education: N/A   Occupational History  . Not on file.   Social History Main Topics  . Smoking status: Current Every Day Smoker    Packs/day: 0.50    Years: 15.00    Types: Cigarettes  . Smokeless tobacco: Current User  . Alcohol use No  . Drug use: No  . Sexual activity: Not on file   Other Topics Concern  . Not on file   Social History Narrative   Lives in [place] with his wife.  Does not routinely exercise.   Past Surgical History:  Procedure Laterality Date  . AMPUTATION Right 06/06/2016   Procedure: AMPUTATION BELOW KNEE;  Surgeon: Annice Needy, MD;  Location: [facility];  Service: General;  Laterality: Right;  . CARDIAC CATHETERIZATION N/A 01/13/2016   Procedure: LEFT HEART CATH AND CORONARY ANGIOGRAPHY;  Surgeon: Iran Ouch, MD;  Location: [facility];  Service: Cardiovascular;  Laterality: N/A;  . CARDIAC CATHETERIZATION N/A 01/13/2016   Procedure: Coronary Stent Intervention;  Surgeon: Iran Ouch, MD;  Location: [facility];  Service: Cardiovascular;  Laterality: N/A;  . CARDIAC CATHETERIZATION N/A 01/16/2016   Procedure: Left Heart Cath and Coronary Angiography;  Surgeon: Iran Ouch, MD;  Location: [facility];  Service: Cardiovascular;  Laterality: N/A;  . INCISION AND DRAINAGE ABSCESS N/A 07/12/2016   Procedure: INCISION AND DRAINAGE GLUTEAL  ABSCESS;  Surgeon: Leafy Ro, MD;  Location: [facility];  Service: General;  Laterality: N/A;  . IRRIGATION AND DEBRIDEMENT FOOT Right 05/08/2016   Procedure: IRRIGATION AND DEBRIDEMENT FOOT WITH PLACEMENT OF ANTIBIOTIC BEADS;  Surgeon: Linus Galas, DPM;  Location: [facility];  Service: Podiatry;  Laterality: Right;  . NECK SURGERY    . PERIPHERAL VASCULAR CATHETERIZATION Left 11/01/2015   Procedure: Lower Extremity Angiography;  Surgeon: Renford Dills, MD;  Location: [facility];  Service: Cardiovascular;  Laterality: Left;  . PERIPHERAL VASCULAR  CATHETERIZATION Right 05/07/2016   Procedure: Lower Extremity Angiography;  Surgeon: Annice Needy, MD;  Location: [facility];  Service: Cardiovascular;  Laterality: Right;   Family History  Problem Relation Age of Onset  . Intracerebral hemorrhage Mother   . Diabetes Mother   . Cancer Mother   . Diabetes Father    No Known Allergies     Assessment & Plan:  Patient presents for a peripheral artery disease follow-up. He was last seen on 07/04/2016. Patient has a history of a right BKA. He denies any issues with his stump. States he was evaluated for a prosthetic and is in the process of receiving one. Patient with a chief complaint of left lower extremity pain. Patient ambulates minimally however when he does he experiences  right calf pain. This calf pain is progressively worsening over the last few months. His claudication distance has shortened. He denies any ulceration to the lower extremity. It does sound like he is starting to experience rest pain symptoms as he wakes up in the middle of the night with left calf "cramping". The patient underwent an ABI today which was notable  Severe left lower extremity arterial disease. When compared to the previous ABI there has been a significant decrease in the left ABI. Patient with monophasic tibials. Patient denies any fever, nausea or vomiting.   1. PAD (peripheral artery disease) (HCC) - Worsening Patient with increasing claudication and discomfort to the left lower extremity. This discomfort has progressive the point he is unable to function on a daily basis. ABI with severe left lower extremity arterial disease. This has worsened when compared to the previous ABI Unable to palpate pedal pulses on exam to the left lower extremity Recommend a left lower extremity angiogram with possible intervention to assess anatomy and if appropriate revascularize the leg at that time Procedure, risks and benefits explained to the patient All questions  answered Patient wishes to proceed  2. Hx of BKA, right (HCC) - stable Stump is healing well Patient to continue prosthetic evaluation  3. Mixed hyperlipidemia - stable Encouraged good control as its slows the progression of atherosclerotic disease  4. Type 2 diabetes mellitus with complication, with long-term current use of insulin (HCC) - stable Encouraged good control as its slows the progression of atherosclerotic disease  Current Outpatient Prescriptions on File Prior to Visit  Medication Sig Dispense Refill  . acetaminophen (TYLENOL) 500 MG tablet Take 500 mg by mouth every 6 (six) hours as needed.    Marland Kitchen acidophilus (RISAQUAD) CAPS capsule Take 1 capsule by mouth daily.    Marland Kitchen albuterol-ipratropium (COMBIVENT) 18-103 MCG/ACT inhaler Inhale into the lungs every 4 (four) hours as needed for wheezing or shortness of breath.    . Amino Acids-Protein Hydrolys (FEEDING SUPPLEMENT, PRO-STAT SUGAR FREE 64,) LIQD Take 30 mLs by mouth 3 (three) times daily with meals.    Marland Kitchen atorvastatin (LIPITOR) 80 MG tablet Take 1 tablet (80 mg total) by mouth daily at 6 PM. 30 tablet 2  . carvedilol (COREG) 12.5 MG tablet Take 1 tablet (12.5 mg total) by mouth 2 (two) times daily with a meal. 60 tablet 30  . clopidogrel (PLAVIX) 75 MG tablet Take 1 tablet (75 mg total) by mouth daily. 30 tablet 0  . collagenase (SANTYL) ointment Apply topically daily. 15 g 0  . feeding supplement, GLUCERNA SHAKE, (GLUCERNA SHAKE) LIQD Take 237 mLs by mouth 3 (three) times daily between meals. 30 Can 0  . ferrous sulfate 325 (65 FE) MG tablet Take 1 tablet (325 mg total) by mouth 3 (three) times daily with meals.  2  . furosemide (LASIX) 40 MG tablet Take 1 tablet (40 mg total) by mouth daily. 30 tablet 1  . gabapentin (NEURONTIN) 300 MG capsule Take 300 mg by mouth daily as needed.     . hydrALAZINE (APRESOLINE) 25 MG tablet Take 1 tablet (25 mg total) by mouth every 8 (eight) hours. 30 tablet 0  . HYDROcodone-acetaminophen  (NORCO/VICODIN) 5-325 MG tablet Take 1 tablet by mouth every 6 (six) hours as needed for moderate pain. 30 tablet 0  . insulin glargine (LANTUS) 100 UNIT/ML injection Inject 0.1 mLs (10 Units total) into the skin at bedtime. 10 mL 11  . Melatonin 5 MG TABS Take 5 mg by  mouth at bedtime as needed (sleep).    . Multiple Vitamin (MULTIVITAMIN WITH MINERALS) TABS tablet Take 1 tablet by mouth daily. 30 tablet 0  . omeprazole (PRILOSEC) 20 MG capsule Take 20 mg by mouth daily.    . ondansetron (ZOFRAN) 4 MG tablet Take 4 mg by mouth every 8 (eight) hours as needed for nausea or vomiting.    . polyethylene glycol (MIRALAX / GLYCOLAX) packet Take 17 g by mouth daily.    . sennosides-docusate sodium (SENOKOT-S) 8.6-50 MG tablet Take 1 tablet by mouth daily.    . tamsulosin (FLOMAX) 0.4 MG CAPS capsule Take 1 capsule (0.4 mg total) by mouth daily. 30 capsule 0  . vitamin C (ASCORBIC ACID) 500 MG tablet Take 500 mg by mouth 2 (two) times daily.     No current facility-administered medications on file prior to visit.     There are no Patient Instructions on file for this visit. No Follow-up on file.   Ger Ringenberg A Braniya Farrugia, PA-C

## 2017-01-10 ENCOUNTER — Other Ambulatory Visit (INDEPENDENT_AMBULATORY_CARE_PROVIDER_SITE_OTHER): Payer: Self-pay | Admitting: Vascular Surgery

## 2017-01-14 MED ORDER — CEFAZOLIN SODIUM-DEXTROSE 2-4 GM/100ML-% IV SOLN: 2.0000 g | Freq: Once | INTRAVENOUS | Status: DC

## 2017-01-15 ENCOUNTER — Ambulatory Visit
Admission: RE | Admit: 2017-01-15 | Discharge: 2017-01-15 | Disposition: A | Discharge status: Skilled Nursing Facility | Payer: Commercial Managed Care - PPO | Source: Ambulatory Visit | Attending: Vascular Surgery | Admitting: Vascular Surgery

## 2017-01-15 ENCOUNTER — Encounter
Admission: RE | Disposition: A | Discharge status: Skilled Nursing Facility | Payer: Self-pay | Source: Ambulatory Visit | Attending: Vascular Surgery

## 2017-01-15 DIAGNOSIS — Z5309 Procedure and treatment not carried out because of other contraindication: Secondary | ICD-10-CM | POA: Insufficient documentation

## 2017-01-15 DIAGNOSIS — I70222 Atherosclerosis of native arteries of extremities with rest pain, left leg: Secondary | ICD-10-CM | POA: Insufficient documentation

## 2017-01-15 SURGERY — LOWER EXTREMITY ANGIOGRAPHY
Anesthesia: Moderate Sedation | Laterality: Left

## 2017-01-15 MED ORDER — METHYLPREDNISOLONE SODIUM SUCC 125 MG IJ SOLR: 125.0000 mg | INTRAMUSCULAR | Status: DC | PRN

## 2017-01-15 MED ORDER — FAMOTIDINE 20 MG PO TABS: 40.0000 mg | ORAL_TABLET | ORAL | Status: DC | PRN

## 2017-01-15 MED ORDER — HYDROMORPHONE HCL 1 MG/ML IJ SOLN: 1.0000 mg | Freq: Once | INTRAMUSCULAR | Status: DC | PRN

## 2017-01-15 MED ORDER — SODIUM CHLORIDE 0.9 % IV SOLN: INTRAVENOUS | Status: DC

## 2017-01-15 MED ORDER — ONDANSETRON HCL 4 MG/2ML IJ SOLN: 4.0000 mg | Freq: Four times a day (QID) | INTRAMUSCULAR | Status: DC | PRN

## 2017-01-15 NOTE — Progress Notes (Signed)
Procedure cancelled due to patient having eaten breakfast just before arriving to hospital. Called [facility] to arrange for transportation back to facility. Spoke to patients wife to inform her that the procedure was cancelled. Also called report to [facility] with Gypsy Balsam, informing her that patient's procedure was cancelled because patient had eaten today, will need to be rescheduled.

## 2017-01-15 NOTE — Progress Notes (Signed)
Patient stated that he ate full breakfast including bacon and cereal at about 0830 this morning. The latest transportation back to [employer] is 5:30pm per [facility]. Shared information with Dr Chipper Herb, awaiting decision for rescheduling or postponing procedure.

## 2017-01-16 ENCOUNTER — Encounter (INDEPENDENT_AMBULATORY_CARE_PROVIDER_SITE_OTHER): Payer: Self-pay

## 2017-01-18 ENCOUNTER — Ambulatory Visit
Admission: RE | Admit: 2017-01-18 | Discharge: 2017-01-18 | Disposition: A | Discharge status: Home / Self Care | Payer: Commercial Managed Care - PPO | Source: Ambulatory Visit | Attending: Family Medicine | Admitting: Family Medicine

## 2017-01-18 DIAGNOSIS — N63 Unspecified lump in unspecified breast: Secondary | ICD-10-CM

## 2017-01-18 DIAGNOSIS — N6310 Unspecified lump in the right breast, unspecified quadrant: Secondary | ICD-10-CM | POA: Diagnosis not present

## 2017-01-18 DIAGNOSIS — N6320 Unspecified lump in the left breast, unspecified quadrant: Secondary | ICD-10-CM | POA: Diagnosis present

## 2017-01-28 NOTE — OR Nursing (Signed)
Notified laura at [facility] that there are no orders for procedure tomorrow

## 2017-01-29 ENCOUNTER — Encounter
Admission: RE | Disposition: A | Discharge status: Home / Self Care | Payer: Self-pay | Source: Ambulatory Visit | Attending: Vascular Surgery

## 2017-01-29 ENCOUNTER — Other Ambulatory Visit (INDEPENDENT_AMBULATORY_CARE_PROVIDER_SITE_OTHER): Payer: Self-pay | Admitting: Vascular Surgery

## 2017-01-29 ENCOUNTER — Encounter: Payer: Self-pay | Admitting: *Deleted

## 2017-01-29 ENCOUNTER — Ambulatory Visit
Admission: RE | Admit: 2017-01-29 | Discharge: 2017-01-29 | Disposition: A | Discharge status: Home / Self Care | Payer: Commercial Managed Care - PPO | Source: Ambulatory Visit | Attending: Vascular Surgery | Admitting: Vascular Surgery

## 2017-01-29 DIAGNOSIS — E1152 Type 2 diabetes mellitus with diabetic peripheral angiopathy with gangrene: Secondary | ICD-10-CM | POA: Insufficient documentation

## 2017-01-29 DIAGNOSIS — Z9889 Other specified postprocedural states: Secondary | ICD-10-CM | POA: Insufficient documentation

## 2017-01-29 DIAGNOSIS — Z7902 Long term (current) use of antithrombotics/antiplatelets: Secondary | ICD-10-CM | POA: Insufficient documentation

## 2017-01-29 DIAGNOSIS — J449 Chronic obstructive pulmonary disease, unspecified: Secondary | ICD-10-CM | POA: Diagnosis not present

## 2017-01-29 DIAGNOSIS — L97429 Non-pressure chronic ulcer of left heel and midfoot with unspecified severity: Secondary | ICD-10-CM | POA: Insufficient documentation

## 2017-01-29 DIAGNOSIS — E118 Type 2 diabetes mellitus with unspecified complications: Secondary | ICD-10-CM | POA: Diagnosis not present

## 2017-01-29 DIAGNOSIS — Z809 Family history of malignant neoplasm, unspecified: Secondary | ICD-10-CM | POA: Insufficient documentation

## 2017-01-29 DIAGNOSIS — Z794 Long term (current) use of insulin: Secondary | ICD-10-CM | POA: Diagnosis not present

## 2017-01-29 DIAGNOSIS — Z955 Presence of coronary angioplasty implant and graft: Secondary | ICD-10-CM | POA: Insufficient documentation

## 2017-01-29 DIAGNOSIS — Z833 Family history of diabetes mellitus: Secondary | ICD-10-CM | POA: Diagnosis not present

## 2017-01-29 DIAGNOSIS — I70268 Atherosclerosis of native arteries of extremities with gangrene, other extremity: Secondary | ICD-10-CM | POA: Diagnosis present

## 2017-01-29 DIAGNOSIS — E11621 Type 2 diabetes mellitus with foot ulcer: Secondary | ICD-10-CM | POA: Diagnosis not present

## 2017-01-29 DIAGNOSIS — I5042 Chronic combined systolic (congestive) and diastolic (congestive) heart failure: Secondary | ICD-10-CM | POA: Diagnosis not present

## 2017-01-29 DIAGNOSIS — Z89511 Acquired absence of right leg below knee: Secondary | ICD-10-CM | POA: Insufficient documentation

## 2017-01-29 DIAGNOSIS — Z8249 Family history of ischemic heart disease and other diseases of the circulatory system: Secondary | ICD-10-CM | POA: Diagnosis not present

## 2017-01-29 DIAGNOSIS — I130 Hypertensive heart and chronic kidney disease with heart failure and stage 1 through stage 4 chronic kidney disease, or unspecified chronic kidney disease: Secondary | ICD-10-CM | POA: Insufficient documentation

## 2017-01-29 DIAGNOSIS — I70262 Atherosclerosis of native arteries of extremities with gangrene, left leg: Secondary | ICD-10-CM | POA: Diagnosis not present

## 2017-01-29 DIAGNOSIS — L97919 Non-pressure chronic ulcer of unspecified part of right lower leg with unspecified severity: Secondary | ICD-10-CM | POA: Diagnosis not present

## 2017-01-29 DIAGNOSIS — N183 Chronic kidney disease, stage 3 (moderate): Secondary | ICD-10-CM | POA: Diagnosis not present

## 2017-01-29 DIAGNOSIS — E782 Mixed hyperlipidemia: Secondary | ICD-10-CM | POA: Insufficient documentation

## 2017-01-29 DIAGNOSIS — E1122 Type 2 diabetes mellitus with diabetic chronic kidney disease: Secondary | ICD-10-CM | POA: Insufficient documentation

## 2017-01-29 HISTORY — PX: LOWER EXTREMITY ANGIOGRAPHY: CATH118251

## 2017-01-29 LAB — BUN: BUN: 36 mg/dL — ABNORMAL HIGH (ref 6–20)

## 2017-01-29 LAB — CREATININE, SERUM
Creatinine, Ser: 1.2 mg/dL (ref 0.61–1.24)
GFR calc Af Amer: >60 mL/min (ref >60)
GFR calc non Af Amer: >60 mL/min (ref >60)

## 2017-01-29 LAB — GLUCOSE, CAPILLARY: GLUCOSE-CAPILLARY: 263 mg/dL — AB (ref 65–99)

## 2017-01-29 SURGERY — LOWER EXTREMITY ANGIOGRAPHY
Anesthesia: Moderate Sedation | Laterality: Left

## 2017-01-29 MED ORDER — DEXTROSE 5 % IV SOLN
2.0000 g | Freq: Once | INTRAVENOUS | Status: DC
  Filled 2017-01-29: qty 2000

## 2017-01-29 MED ORDER — HYDROMORPHONE HCL 1 MG/ML IJ SOLN: 1.0000 mg | Freq: Once | INTRAMUSCULAR | Status: DC | PRN

## 2017-01-29 MED ORDER — ONDANSETRON HCL 4 MG/2ML IJ SOLN: 4.0000 mg | Freq: Four times a day (QID) | INTRAMUSCULAR | Status: DC | PRN

## 2017-01-29 MED ORDER — LABETALOL HCL 5 MG/ML IV SOLN: 10.0000 mg | INTRAVENOUS | Status: DC | PRN

## 2017-01-29 MED ORDER — HEPARIN (PORCINE) IN NACL 2-0.9 UNIT/ML-% IJ SOLN
INTRAMUSCULAR | Status: AC
  Filled 2017-01-29: qty 1000

## 2017-01-29 MED ORDER — MIDAZOLAM HCL 5 MG/5ML IJ SOLN
INTRAMUSCULAR | Status: AC
  Filled 2017-01-29: qty 5

## 2017-01-29 MED ORDER — METHYLPREDNISOLONE SODIUM SUCC 125 MG IJ SOLR: 125.0000 mg | INTRAMUSCULAR | Status: DC | PRN

## 2017-01-29 MED ORDER — HEPARIN SODIUM (PORCINE) 1000 UNIT/ML IJ SOLN
INTRAMUSCULAR | Status: DC | PRN
  Administered 2017-01-29: 5000 [IU] via INTRAVENOUS

## 2017-01-29 MED ORDER — SODIUM CHLORIDE 0.9 % IV SOLN: INTRAVENOUS | Status: DC

## 2017-01-29 MED ORDER — FENTANYL CITRATE (PF) 100 MCG/2ML IJ SOLN
INTRAMUSCULAR | Status: AC
  Filled 2017-01-29: qty 2

## 2017-01-29 MED ORDER — SODIUM CHLORIDE 0.9% FLUSH: 3.0000 mL | Freq: Two times a day (BID) | INTRAVENOUS | Status: DC

## 2017-01-29 MED ORDER — FENTANYL CITRATE (PF) 100 MCG/2ML IJ SOLN
INTRAMUSCULAR | Status: DC | PRN
  Administered 2017-01-29 (×3): 50 ug via INTRAVENOUS
  Administered 2017-01-29: 25 ug via INTRAVENOUS
  Administered 2017-01-29: 50 ug via INTRAVENOUS
  Administered 2017-01-29: 25 ug via INTRAVENOUS

## 2017-01-29 MED ORDER — FAMOTIDINE 20 MG PO TABS: 40.0000 mg | ORAL_TABLET | ORAL | Status: DC | PRN

## 2017-01-29 MED ORDER — SODIUM CHLORIDE 0.9 % IV SOLN: 250.0000 mL | INTRAVENOUS | Status: DC | PRN

## 2017-01-29 MED ORDER — CEFAZOLIN SODIUM-DEXTROSE 2-4 GM/100ML-% IV SOLN
2.0000 g | Freq: Once | INTRAVENOUS | Status: DC
  Administered 2017-01-29: 2 g via INTRAVENOUS
  Filled 2017-01-29: qty 100

## 2017-01-29 MED ORDER — SODIUM CHLORIDE 0.9% FLUSH: 3.0000 mL | INTRAVENOUS | Status: DC | PRN

## 2017-01-29 MED ORDER — LIDOCAINE HCL (PF) 1 % IJ SOLN
INTRAMUSCULAR | Status: AC
  Filled 2017-01-29: qty 30

## 2017-01-29 MED ORDER — MIDAZOLAM HCL 2 MG/2ML IJ SOLN
INTRAMUSCULAR | Status: DC | PRN
  Administered 2017-01-29: 2 mg via INTRAVENOUS
  Administered 2017-01-29 (×3): 1 mg via INTRAVENOUS
  Administered 2017-01-29: 2 mg via INTRAVENOUS
  Administered 2017-01-29 (×2): 1 mg via INTRAVENOUS

## 2017-01-29 MED ORDER — ASPIRIN EC 81 MG PO TBEC
81.0000 mg | DELAYED_RELEASE_TABLET | Freq: Every day | ORAL | Status: DC
  Administered 2017-01-29: 81 mg via ORAL
  Filled 2017-01-29 (×2): qty 1

## 2017-01-29 MED ORDER — IOPAMIDOL (ISOVUE-300) INJECTION 61%
INTRAVENOUS | Status: DC | PRN
  Administered 2017-01-29: 115 mL via INTRA_ARTERIAL

## 2017-01-29 MED ORDER — HYDRALAZINE HCL 20 MG/ML IJ SOLN: 5.0000 mg | INTRAMUSCULAR | Status: DC | PRN

## 2017-01-29 MED ORDER — HEPARIN SODIUM (PORCINE) 1000 UNIT/ML IJ SOLN
INTRAMUSCULAR | Status: AC
  Filled 2017-01-29: qty 1

## 2017-01-29 MED ORDER — OXYCODONE HCL 5 MG PO TABS: 5.0000 mg | ORAL_TABLET | ORAL | Status: DC | PRN

## 2017-01-29 SURGICAL SUPPLY — 27 items
BALLN LUTONIX 4X220X130 (BALLOONS) ×6
BALLN LUTONIX 5X150X130 (BALLOONS) ×3
BALLN ULTRVRSE 3X100X130C (BALLOONS) ×3
BALLN ULTRVRSE 5X250X130 (BALLOONS) ×3
BALLOON LUTONIX 4X220X130 (BALLOONS) IMPLANT
BALLOON LUTONIX 5X150X130 (BALLOONS) IMPLANT
BALLOON ULTRVRSE 3X100X130C (BALLOONS) IMPLANT
BALLOON ULTRVRSE 5X250X130 (BALLOONS) IMPLANT
CATH BEACON 5 .038 100 VERT TP (CATHETERS) ×2 IMPLANT
CATH PIG 70CM (CATHETERS) ×2 IMPLANT
DEVICE PRESTO INFLATION (MISCELLANEOUS) ×2 IMPLANT
DEVICE STARCLOSE SE CLOSURE (Vascular Products) ×2 IMPLANT
DEVICE TORQUE (MISCELLANEOUS) ×2 IMPLANT
GLIDEWIRE ANGLED SS 035X260CM (WIRE) ×2 IMPLANT
KIT FLOWMATE PROCEDURAL (MISCELLANEOUS) ×2 IMPLANT
NDL ENTRY 21GA 7CM ECHOTIP (NEEDLE) IMPLANT
NEEDLE ENTRY 21GA 7CM ECHOTIP (NEEDLE) ×3 IMPLANT
PACK ANGIOGRAPHY (CUSTOM PROCEDURE TRAY) ×3 IMPLANT
SET INTRO CAPELLA COAXIAL (SET/KITS/TRAYS/PACK) ×2 IMPLANT
SHEATH BRITE TIP 5FRX11 (SHEATH) ×2 IMPLANT
SHEATH HIGHFLEX ANSEL 6FRX55 (SHEATH) ×2 IMPLANT
SHIELD RADPAD SCOOP 12X17 (MISCELLANEOUS) ×2 IMPLANT
STENT LIFESTENT 5F 5X170X135 (Permanent Stent) ×2 IMPLANT
STENT LIFESTENT 5F 6X80X135 (Permanent Stent) ×2 IMPLANT
TUBING CONTRAST HIGH PRESS 72 (TUBING) ×2 IMPLANT
WIRE HI TORQ VERSACORE 300 (WIRE) ×2 IMPLANT
WIRE J 3MM .035X145CM (WIRE) ×2 IMPLANT

## 2017-01-29 NOTE — H&P (Signed)
[facility] History & Physical Update  The patient was interviewed and re-examined.  The patient's previous History and Physical has been reviewed and is unchanged.  There is no change in the plan of care. We plan to proceed with the scheduled procedure.  Levora DredgeGregory Dawsen Krieger, MD  01/29/2017, 9:22 AM

## 2017-01-29 NOTE — Progress Notes (Signed)
Dr. Gilda CreaseSchnier in at bedside to speak with pt. And his spouse re: PTA/stent procedure. 2 messages left with [facility] for transport back to pt. Residence. Pt ate late lunch. Denies any c/o at present.

## 2017-01-29 NOTE — Op Note (Signed)
[name] Percutaneous Study/Intervention Procedural Note   Date of Surgery: 01/29/2017  Surgeon:  Katha Cabal, MD.  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 Introduction catheter into left lower extremity 3rd order catheter placement  2. Contrast injection left lower extremity for distal runoff   3. Percutaneous transluminal angioplasty and stent placement left superficial femoral artery and above-knee popliteal 4. Star close closure right common femoral arteriotomy  Anesthesia: Conscious sedation was administered under my direct supervision by the interventional radiology RN. IV Versed plus fentanyl were utilized. Continuous ECG, pulse oximetry and blood pressure was monitored throughout the entire procedure.  Conscious sedation was for a total of 83 minutes.  Sheath: Does Damon [name] need another round  Contrast: 115 cc  Fluoroscopy Time: 15.6 minutes  Indications: Damon [name] presents with atherosclerotic occlusive disease of the left lower extremity associated with ulceration and gangrene of the left dorsum of the foot and ankle.  This puts the patient at risk for limb loss.  He is status post below-knee amputation on the right side.  He is therefore elected to proceed with angiography and hope for intervention.  The risks and benefits are reviewed all questions answered patient agrees to proceed.  Procedure: Damon [name] is a 66 y.o. y.o. male who was identified and appropriate procedural time out was performed. The patient was then placed supine on the table and prepped and draped in the usual sterile fashion.   Ultrasound was placed in the sterile sleeve and the right groin was evaluated the right common femoral artery was echolucent and  pulsatile indicating patency.  Image was recorded for the permanent record and under real-time visualization a microneedle was inserted into the common femoral artery microwire followed by a micro-sheath.  A J-wire was then advanced through the micro-sheath and a  5 [place] sheath was then inserted over a J-wire. J-wire was then advanced and a 5 French pigtail catheter was positioned at the level of T12. AP projection of the aorta was then obtained. Pigtail catheter was repositioned to above the bifurcation and a RAO view of the pelvis was obtained.  Subsequently a pigtail catheter with the stiff angle Glidewire was used to cross the aortic bifurcation the catheter wire were advanced down into the left distal external iliac artery. Oblique view of the femoral bifurcation was then obtained and subsequently the wire was reintroduced and the pigtail catheter negotiated into the SFA representing third order catheter placement. Distal runoff was then performed.  5000 units of heparin was then given and allowed to circulate and a 6 [place] Ansel-flex sheath was advanced up and over the bifurcation and positioned in the femoral artery  KMP  catheter and stiff angle Glidewire were then negotiated down into the distal popliteal.  Distal runoff was then completed by hand injection through the catheter.  Distal runoff at the level of the ankle was incomplete from injections at the level of the profunda femoris.  The Glidewire wire was then reintroduced and a KMP catheter was negotiated down into 1 of the large branches of the profunda femoris.  This appeared to occlude approximately 3 cm distal to the origin but it clearly lined up with the mid thigh with reconstitution of the SFA.  Using the Kumpe catheter and the Glidewire the occlusion was crossed and hand-injection of contrast through the Kumpe verified intraluminal placement within the mid  SFA.  The wire and catheter were then negotiated down into the distal  popliteal.  A 3 mm x 15 cm Ultraverse balloon was used to angioplasty the superficial femoral and popliteal arteries. Inflations were to 12 atmospheres for 1 minute.  Subsequently, a 4 mm x 22 cm Lutonix drug-eluting balloon was used to angioplasty the distal SFA and proximal popliteal.  A 5 mm x 15 cm Lutonix drug-eluting balloon was then used to treat the most proximal portion of the SFA.  Follow-up imaging demonstrated greater than 50% residual stenosis at multiple locations along the SFA and therefore a 5 mm x 170 mm life stent was deployed, followed by a 6 mm x 80 mm life stent and subsequently postdilated with a 5 mm x 25 cm Ultraverse balloon inflated to 12 atm for 30 seconds. Distal runoff was then reassessed.  After review of these images the sheath is pulled into the right external iliac oblique of the common femoral is obtained and a Star close device deployed. There no immediate complications.   Findings: The abdominal aorta is opacified with a bolus injection contrast. Renal arteries are noted to have greater than 60% stenosis bilaterally.  On the left there is an accessory renal artery which also has a greater than 50% stenosis.. The aorta itself has diffuse disease but no hemodynamically significant lesions. The common and external iliac arteries are widely patent bilaterally.  Previously placed stents on the right and the left are patent.  The left common femoral is patent with moderate plaque formation in its midportion.  The profunda femoris widely patent.  As noted above the origin the SFA appears to originate from the profunda femoris after the third order branches from the main trunk..  The SFA does indeed have an occlusion with reconstitution in the mid thigh and then diffuse critical stenosis at the level of Hunter's canal and in the proximal popliteal.  The distal popliteal demonstrates increasing disease and the trifurcation is heavily diseased with occlusion of the anterior  tibial.  The peroneal and posterior tibial are patent and filled the pedal arch.   Angioplasty and stent placement of the SFA and proximal popliteal yields an excellent result with less than 5 % residual stenosis.  Distal runoff is preserved    Summary: Successful recanalization left lower extremity for limb salvage    Disposition: Patient was taken to the recovery room in stable condition having tolerated the procedure well.  Schnier, Dolores Lory 01/29/2017,2:09 PM

## 2017-01-29 NOTE — Progress Notes (Signed)
Pt. Assisted OOB to w/c . Right groin clean, dry, intact without complications at site. Pt. [facility] front [facility]. Stable for DC.

## 2017-01-30 ENCOUNTER — Encounter: Payer: Self-pay | Admitting: Vascular Surgery

## 2017-02-12 ENCOUNTER — Other Ambulatory Visit (INDEPENDENT_AMBULATORY_CARE_PROVIDER_SITE_OTHER): Payer: Self-pay | Admitting: Vascular Surgery

## 2017-02-12 DIAGNOSIS — I70245 Atherosclerosis of native arteries of left leg with ulceration of other part of foot: Secondary | ICD-10-CM

## 2017-02-13 ENCOUNTER — Ambulatory Visit (INDEPENDENT_AMBULATORY_CARE_PROVIDER_SITE_OTHER): Payer: Commercial Managed Care - PPO | Admitting: Vascular Surgery

## 2017-02-13 ENCOUNTER — Encounter (INDEPENDENT_AMBULATORY_CARE_PROVIDER_SITE_OTHER): Payer: Commercial Managed Care - PPO

## 2017-02-14 ENCOUNTER — Ambulatory Visit (INDEPENDENT_AMBULATORY_CARE_PROVIDER_SITE_OTHER): Payer: Commercial Managed Care - PPO | Admitting: Vascular Surgery

## 2017-02-14 ENCOUNTER — Ambulatory Visit (INDEPENDENT_AMBULATORY_CARE_PROVIDER_SITE_OTHER): Payer: Commercial Managed Care - PPO

## 2017-02-14 ENCOUNTER — Encounter (INDEPENDENT_AMBULATORY_CARE_PROVIDER_SITE_OTHER): Payer: Self-pay | Admitting: Vascular Surgery

## 2017-02-14 VITALS — BP 121/62 | HR 60 | Resp 17 | Ht 69.0 in | Wt 147.0 lb

## 2017-02-14 DIAGNOSIS — I739 Peripheral vascular disease, unspecified: Secondary | ICD-10-CM

## 2017-02-14 DIAGNOSIS — I70245 Atherosclerosis of native arteries of left leg with ulceration of other part of foot: Secondary | ICD-10-CM

## 2017-02-14 DIAGNOSIS — E782 Mixed hyperlipidemia: Secondary | ICD-10-CM

## 2017-02-14 DIAGNOSIS — Z794 Long term (current) use of insulin: Secondary | ICD-10-CM

## 2017-02-14 DIAGNOSIS — Z89511 Acquired absence of right leg below knee: Secondary | ICD-10-CM

## 2017-02-14 DIAGNOSIS — E118 Type 2 diabetes mellitus with unspecified complications: Secondary | ICD-10-CM | POA: Diagnosis not present

## 2017-02-14 NOTE — Progress Notes (Signed)
Subjective:    Patient ID: Damon [name], male    DOB: 12/02/1950, 66 y.o.   MRN: [id number] Chief Complaint  Patient presents with  . Follow-up    2wk post angio   The patient presents for his first post procedure follow-up.  The patient is status post a left FNA / popliteal angioplasty and stent placement on January 29, 2017.  The patient presents today without complaint.  The patient has experienced an overall improvement in his left lower extremity.  The patient denies any issues with his right BKA stump.  He presents today wearing his prosthetic.  The patient underwent a left lower extremity ABI which was notable for biphasic tibials.  The left ankle-brachial index suggests mild left lower extremity arterial occlusive disease.  When compared to the previous ABI there is a significant increase in arterial flow to the left lower extremity.  Denies any fever nausea vomiting.   Review of Systems  Constitutional: Negative.   HENT: Negative.   Eyes: Negative.   Respiratory: Negative.   Cardiovascular: Negative.   Gastrointestinal: Negative.   Endocrine: Negative.   Genitourinary: Negative.   Musculoskeletal: Negative.   Skin: Negative.   Allergic/Immunologic: Negative.   Neurological: Negative.   Hematological: Negative.   Psychiatric/Behavioral: Negative.       Objective:   Physical Exam  Constitutional: He is oriented to person, place, and time. He appears well-developed and well-nourished. No distress.  HENT:  Head: Normocephalic and atraumatic.  Eyes: Conjunctivae are normal. Pupils are equal, round, and reactive to light.  Neck: Normal range of motion.  Cardiovascular: Normal rate, regular rhythm, normal heart sounds and intact distal pulses.  Pulses:      Radial pulses are 2+ on the right side, and 2+ on the left side.  Hard to palpate pedal pulses to the left lower extremity Right BKA stump is healthy  Pulmonary/Chest: Effort normal.  Musculoskeletal: Normal range of  motion. He exhibits no edema.  Neurological: He is alert and oriented to person, place, and time.  Skin: Skin is warm and dry. He is not diaphoretic.  Psychiatric: He has a normal mood and affect. His behavior is normal. Judgment and thought content normal.  Vitals reviewed.  BP 121/62 (BP Location: Left Arm)   Pulse 60   Resp 17   Ht 5\' 9"  (1.753 m)   Wt 147 lb (66.7 kg)   BMI 21.71 kg/m   Past Medical History:  Diagnosis Date  . CAD (coronary artery disease)    a. 01/2016 MI/PCI: LM nl, LAD 1468m/d (3.25 x 28 Xience DES), 40d, LCX 30ost, OM1 95 (staged - 2.75 x 18 Xience Alpine DES), OM2/3 min irregs, RCA 6459m.  . Chronic combined systolic and diastolic CHF (congestive heart failure) (HCC)    a. 01/2016 Echo: Ef 25-30%, Gr1 DD, mild AI, mildly dil LA;  b. 02/2016 Echo: EF 30-35%, apical AK, antsept and ant HK, nl RV fxn.  . CKD (chronic kidney disease), stage III (HCC)   . COPD (chronic obstructive pulmonary disease) (HCC)   . Diabetes mellitus without complication (HCC)   . Diverticulitis   . Essential hypertension   . Ischemic cardiomyopathy    a. 01/2016 Echo: Ef 25-30%;  b. 02/2016 Echo: EF 30-35%.  Marland Kitchen. LBBB (left bundle branch block)   . PAD (peripheral artery disease) (HCC)    a. 10/2015 s/p PTA and stenting of the RCIA, LCIA, and PTA of the LEIA (Schnier); b. 04/2016 PTA or R tibioperoneal trunk &  prox PT, PTA/DBA of R Pop and SFA, Viabahn covered stent x 2 to the R SFA and pop (5mm x 25 cm & 5mm x 15cm).  . Tobacco abuse    Social History   Socioeconomic History  . Marital status: Married    Spouse name: Not on file  . Number of children: Not on file  . Years of education: Not on file  . Highest education level: Not on file  Social Needs  . Financial resource strain: Not on file  . Food insecurity - worry: Not on file  . Food insecurity - inability: Not on file  . Transportation needs - medical: Not on file  . Transportation needs - non-medical: Not on file    Occupational History  . Not on file  Tobacco Use  . Smoking status: Current Every Day Smoker    Packs/day: 0.50    Years: 30.00    Pack years: 15.00    Types: Cigarettes  . Smokeless tobacco: Current User  Substance and Sexual Activity  . Alcohol use: No  . Drug use: No  . Sexual activity: Not on file  Other Topics Concern  . Not on file  Social History Narrative   Lives in [place] with his wife.  Does not routinely exercise.   Past Surgical History:  Procedure Laterality Date  . NECK SURGERY     Family History  Problem Relation Age of Onset  . Intracerebral hemorrhage Mother   . Diabetes Mother   . Cancer Mother   . Diabetes Father    No Known Allergies     Assessment & Plan:  The patient presents for his first post procedure follow-up.  The patient is status post a left FNA / popliteal angioplasty and stent placement on January 29, 2017.  The patient presents today without complaint.  The patient has experienced an overall improvement in his left lower extremity.  The patient denies any issues with his right BKA stump.  He presents today wearing his prosthetic.  The patient underwent a left lower extremity ABI which was notable for biphasic tibials.  The left ankle-brachial index suggests mild left lower extremity arterial occlusive disease.  When compared to the previous ABI there is a significant increase in arterial flow to the left lower extremity.  Denies any fever nausea vomiting.  1. PAD (peripheral artery disease) (HCC) The patient presents today for his first post procedure follow-up He presents today without complaint.  He states his left lower extremity "feels better". There are no issues with his right BKA stump. Hard to palpate left lower extremity pedal pulses however the foot is warm There is no indication for intervention at this time The patient is to follow-up in 6 months with an ABI and a left lower extremity arterial duplex exam  - VAS US ABI  WITH/WO TBI; Future - VAS US LOWER EXTREMITY ARTERIAL DUPLEX; Future  2. Hx of BKA, right (HCC) - Stable This is healthy. Patient presents today wearing his prosthetic  3. Mixed hyperlipidemia - Stable Encouraged good control as its slows the progression of atherosclerotic disease  4. Type 2 diabetes mellitus with complication, with long-term current use of insulin (HCC) - Stable Encouraged good control as its slows the progression of atherosclerotic disease  Current Outpatient Medications on File Prior to Visit  Medication Sig Dispense Refill  . Amino Acids-Protein Hydrolys (FEEDING SUPPLEMENT, PRO-STAT SUGAR FREE 64,) LIQD Take 30 mLs by mouth 3 (three) times daily.     .Marland Kitchen  amLODipine (NORVASC) 10 MG tablet Take 10 mg by mouth daily.    Marland Kitchen atorvastatin (LIPITOR) 80 MG tablet Take 1 tablet (80 mg total) by mouth daily at 6 PM. 30 tablet 2  . carvedilol (COREG) 12.5 MG tablet Take 1 tablet (12.5 mg total) by mouth 2 (two) times daily with a meal. 60 tablet 30  . clopidogrel (PLAVIX) 75 MG tablet Take 1 tablet (75 mg total) by mouth daily. 30 tablet 0  . ferrous sulfate 325 (65 FE) MG tablet Take 1 tablet (325 mg total) by mouth 3 (three) times daily with meals.  2  . furosemide (LASIX) 20 MG tablet Take 20 mg by mouth daily as needed (shortness of breath).    . furosemide (LASIX) 40 MG tablet Take 1 tablet (40 mg total) by mouth daily. 30 tablet 1  . gabapentin (NEURONTIN) 300 MG capsule Take 300 mg by mouth daily as needed (pain).     . hydrALAZINE (APRESOLINE) 25 MG tablet Take 1 tablet (25 mg total) by mouth every 8 (eight) hours. 30 tablet 0  . HYDROcodone-acetaminophen (NORCO/VICODIN) 5-325 MG tablet Take 1 tablet by mouth every 6 (six) hours as needed for moderate pain. 30 tablet 0  . insulin aspart (NOVOLOG) 100 UNIT/ML injection Inject 6 Units into the skin 2 (two) times daily with breakfast and lunch. If reading is between 70-100 give after eating. Hold if reading is less than 70 or  if resident will not eat    . Ipratropium-Albuterol (COMBIVENT RESPIMAT) 20-100 MCG/ACT AERS respimat Inhale 1 puff into the lungs every 4 (four) hours as needed for wheezing.    . Melatonin 5 MG TABS Take 5 mg by mouth at bedtime.     . Multiple Vitamin (MULTIVITAMIN WITH MINERALS) TABS tablet Take 1 tablet by mouth daily. 30 tablet 0  . Multiple Vitamins-Minerals (ZINC PO) Take 1 tablet by mouth daily.    Marland Kitchen omeprazole (PRILOSEC) 20 MG capsule Take 20 mg by mouth daily.    . polyethylene glycol (MIRALAX / GLYCOLAX) packet Take 17 g by mouth daily.    Marland Kitchen senna (SENOKOT) 8.6 MG tablet Take 1 tablet by mouth daily.    . tamsulosin (FLOMAX) 0.4 MG CAPS capsule Take 1 capsule (0.4 mg total) by mouth daily. 30 capsule 0  . vitamin C (ASCORBIC ACID) 500 MG tablet Take 500 mg by mouth daily.      No current facility-administered medications on file prior to visit.     There are no Patient Instructions on file for this visit. No Follow-up on file.   Starlett Pehrson A Travarius Lange, PA-C

## 2017-02-14 NOTE — Progress Notes (Signed)
a 

## 2017-07-02 IMAGING — CR DG CHEST 2V
2 series · 2 of 2 positions shown · non-contrast
Comparison: 01/15/2016, 03/24/2015 and 04/14/2008.

CLINICAL DATA: 65-year-old male with shortness breath. Myocardial
infarction 4 weeks ago. Hypertension. Diabetes. Smoker. Subsequent
encounter.

EXAM:
CHEST  2 VIEW

[chest pa]
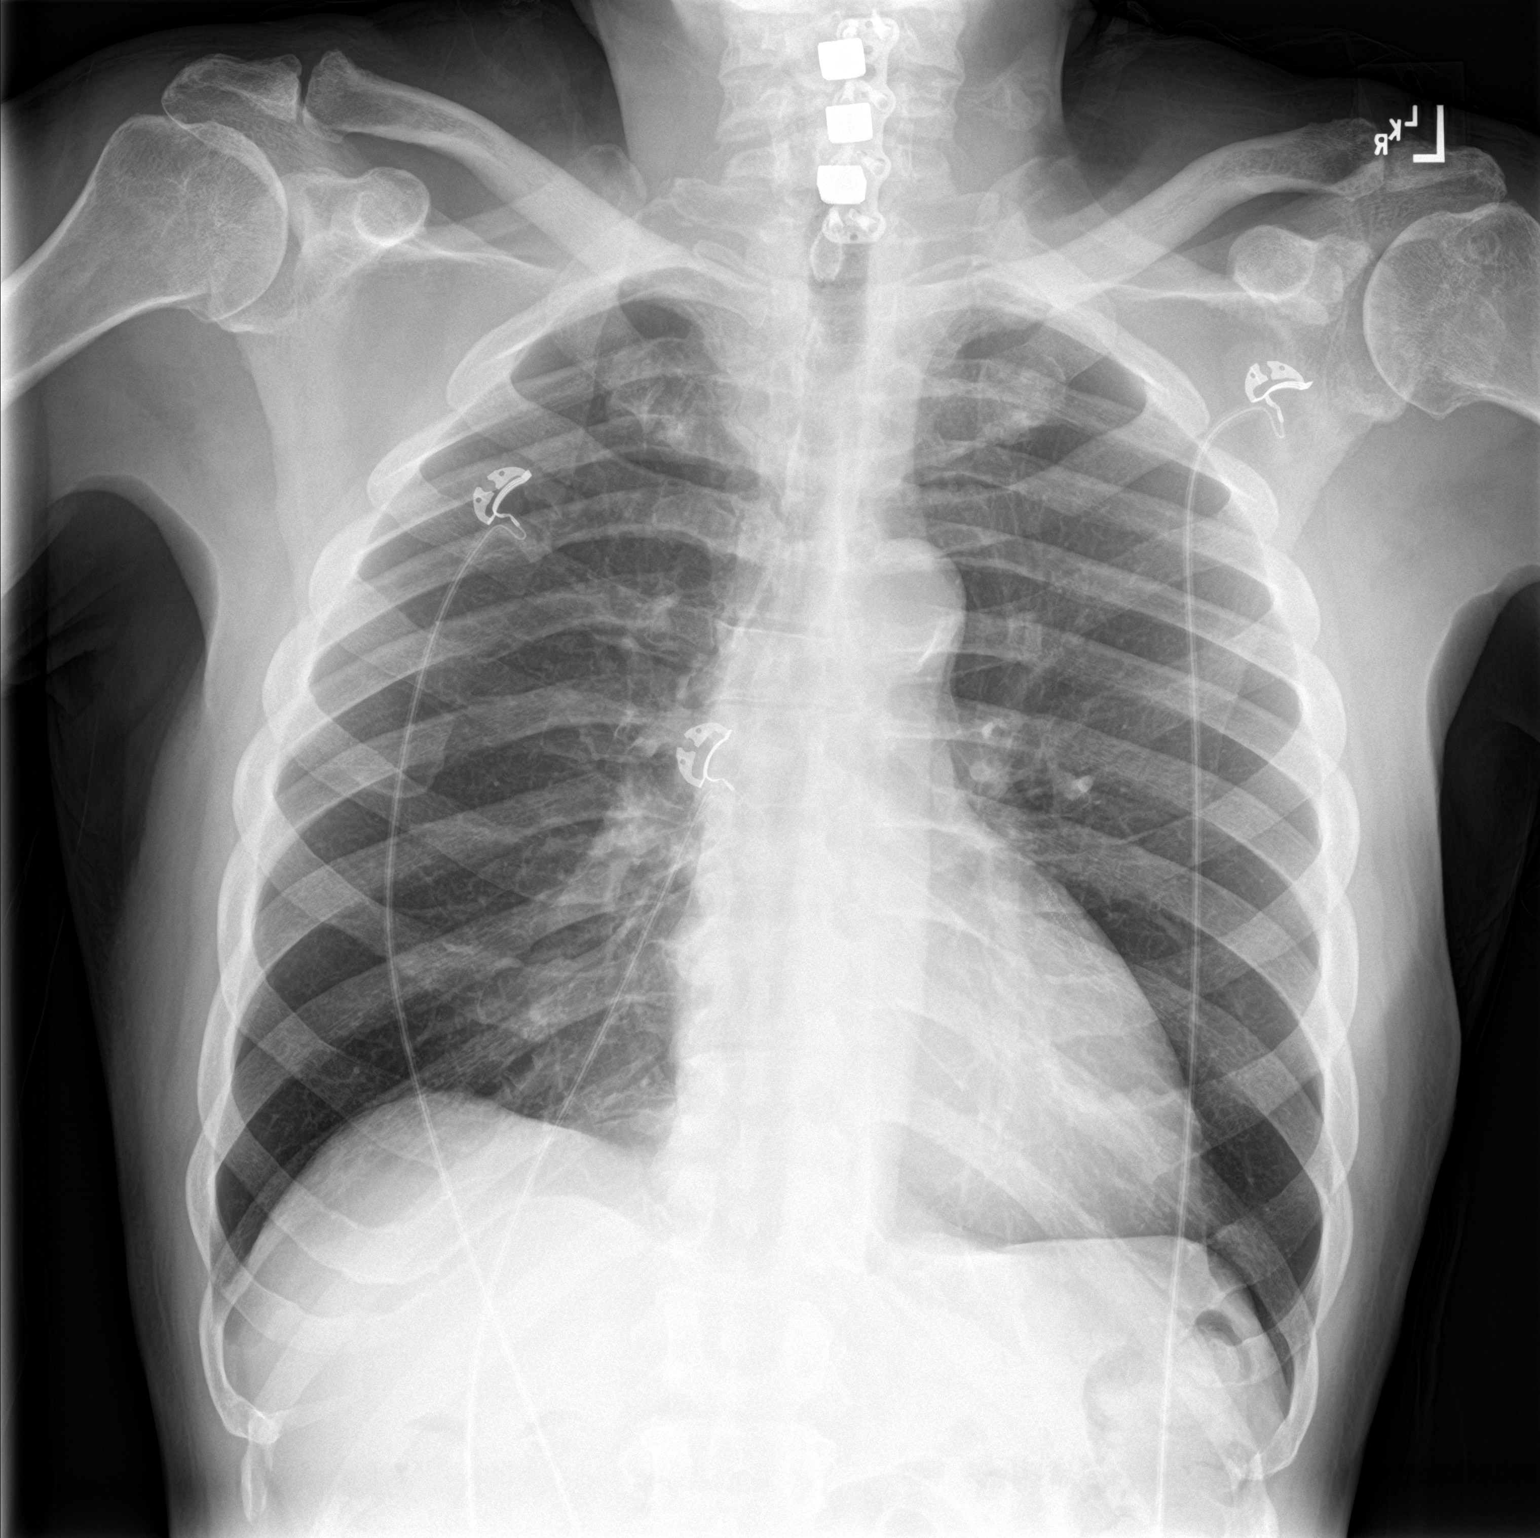

[chest lat]
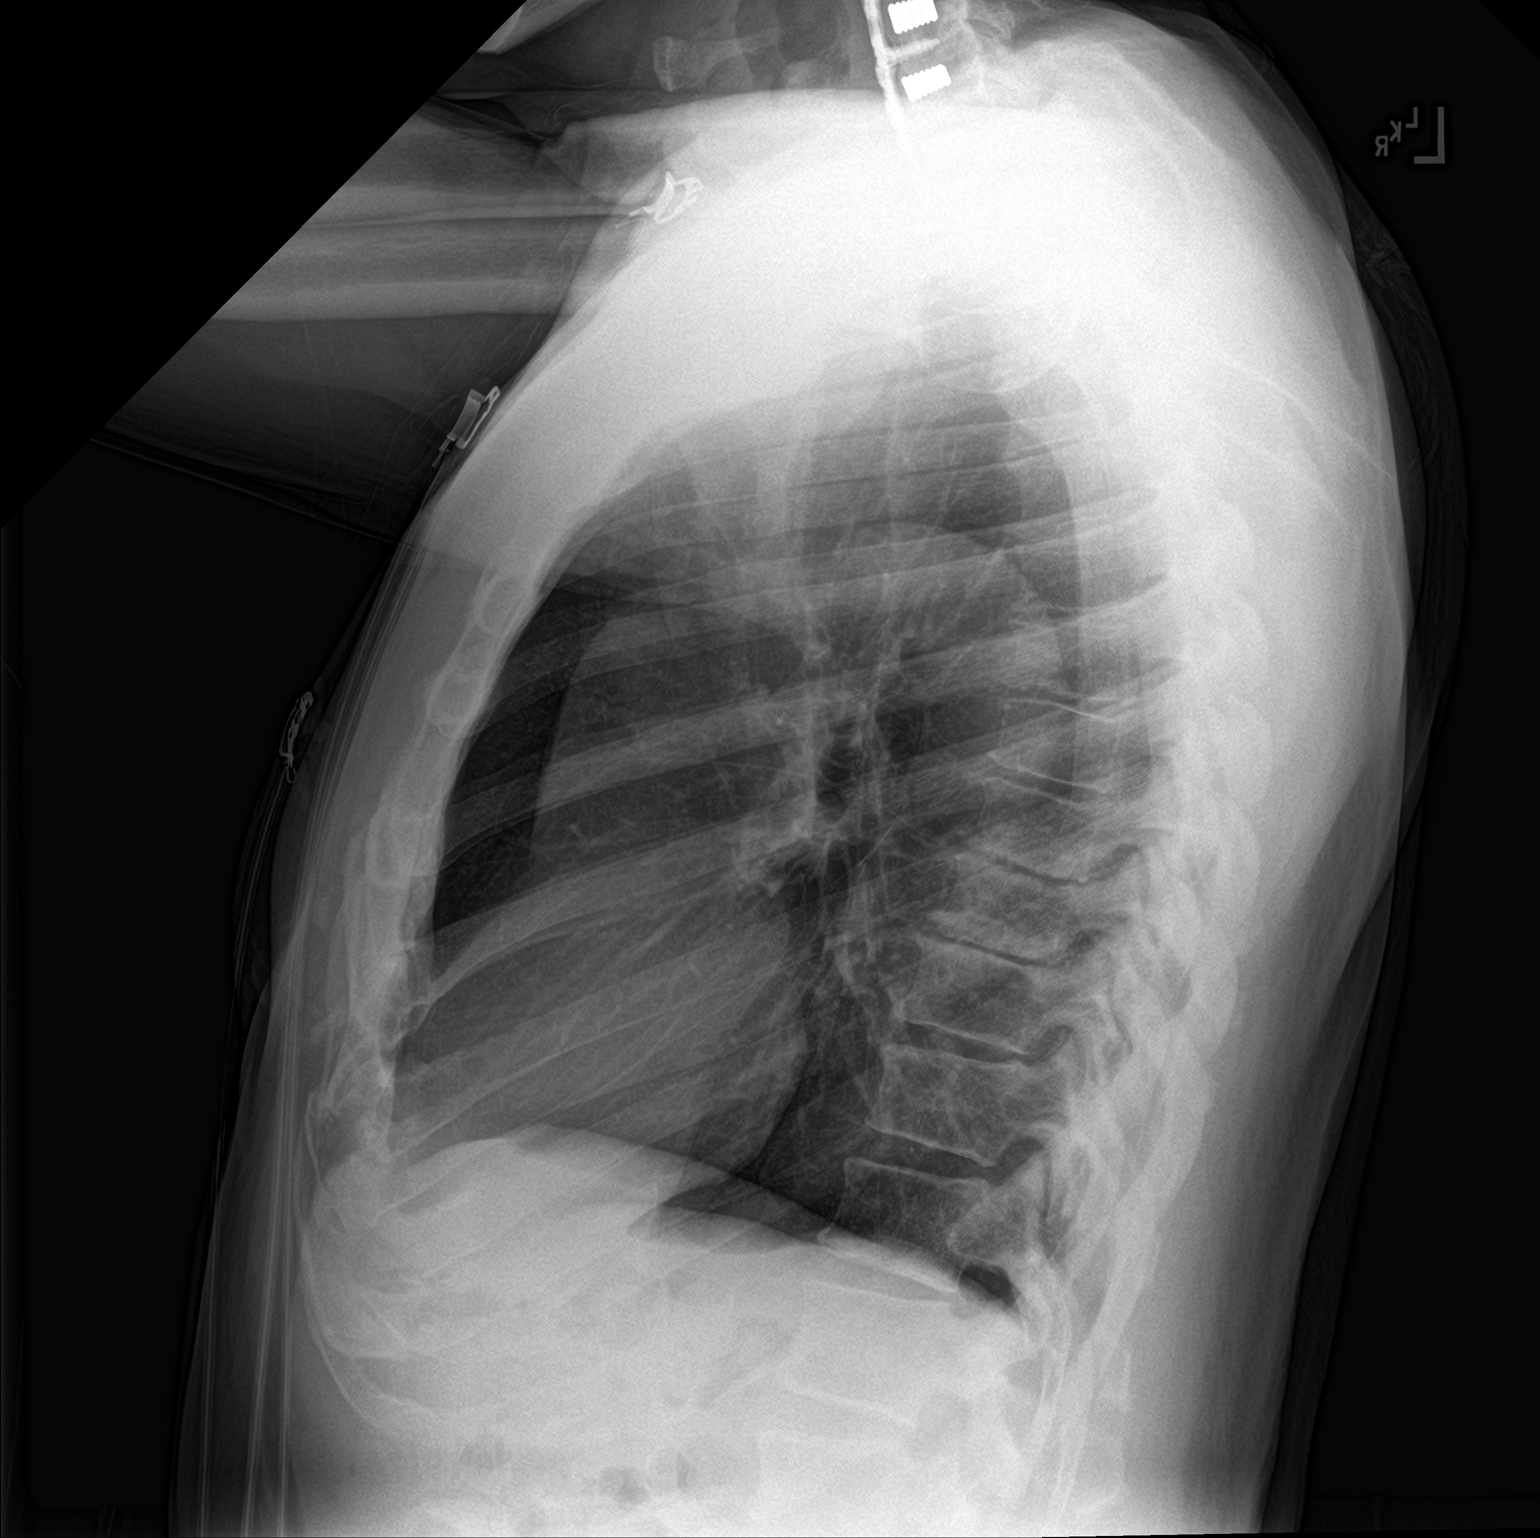

[2 of 2 positions shown; findings below may reference images not displayed]

FINDINGS: Hyperinflation of the lungs with central pulmonary vascular
prominence stable.

No infiltrate, congestive heart failure or pneumothorax.

No plain film evidence of pulmonary malignancy.

Heart size within normal limits.

Calcified aorta.

Postsurgical changes cervical spine. Degenerative changes thoracic
spine and acromioclavicular joint.
IMPRESSION: Hyperinflated lungs without acute pulmonary abnormality.

Aortic atherosclerosis.

## 2017-08-14 ENCOUNTER — Ambulatory Visit (INDEPENDENT_AMBULATORY_CARE_PROVIDER_SITE_OTHER): Payer: Medicare Other

## 2017-08-14 ENCOUNTER — Encounter (INDEPENDENT_AMBULATORY_CARE_PROVIDER_SITE_OTHER): Payer: Self-pay | Admitting: Vascular Surgery

## 2017-08-14 ENCOUNTER — Ambulatory Visit (INDEPENDENT_AMBULATORY_CARE_PROVIDER_SITE_OTHER): Payer: Medicare Other | Admitting: Vascular Surgery

## 2017-08-14 VITALS — BP 106/56 | HR 65 | Resp 16 | Ht 69.0 in | Wt 135.2 lb

## 2017-08-14 DIAGNOSIS — E118 Type 2 diabetes mellitus with unspecified complications: Secondary | ICD-10-CM | POA: Diagnosis not present

## 2017-08-14 DIAGNOSIS — E782 Mixed hyperlipidemia: Secondary | ICD-10-CM

## 2017-08-14 DIAGNOSIS — I739 Peripheral vascular disease, unspecified: Secondary | ICD-10-CM

## 2017-08-14 DIAGNOSIS — Z794 Long term (current) use of insulin: Secondary | ICD-10-CM | POA: Diagnosis not present

## 2017-08-14 NOTE — Progress Notes (Signed)
Subjective:    Patient ID: Damon [name], male    DOB: June 04, 1950, 67 y.o.   MRN: [id number] Chief Complaint  Patient presents with  . Follow-up    46month ultrasound    Patient presents for six-month peripheral artery disease follow-up.  Patient seen with a family member.  The patient presents today without complaint.  The patient denies any claudication-like symptoms, rest pain or ulceration to the left lower extremity.  The patient is status post right below the knee amputation.  The patient underwent a left lower extremity ABI which is notable for biphasic tibials.  Left ABI 1.02.  Left toe brachial index is normal.  Left lower extremity arterial duplex with triphasic blood flow transitioning to biphasic at the tibial arteries.  The patient denies any issues with his right below the knee amputation stump.  Patient denies any fever, nausea or vomiting.  Review of Systems  Constitutional: Negative.   HENT: Negative.   Eyes: Negative.   Respiratory: Negative.   Cardiovascular:       PAD  Gastrointestinal: Negative.   Endocrine: Negative.   Genitourinary: Negative.   Musculoskeletal: Negative.   Skin: Negative.   Allergic/Immunologic: Negative.   Neurological: Negative.   Hematological: Negative.   Psychiatric/Behavioral: Negative.       Objective:   Physical Exam  Constitutional: He is oriented to person, place, and time. He appears well-developed and well-nourished. No distress.  HENT:  Head: Normocephalic and atraumatic.  Right Ear: External ear normal.  Left Ear: External ear normal.  Eyes: Pupils are equal, round, and reactive to light. Conjunctivae and EOM are normal.  Neck: Normal range of motion.  Cardiovascular: Normal rate, regular rhythm, normal heart sounds and intact distal pulses.  Pulses:      Radial pulses are 2+ on the right side, and 2+ on the left side.       Dorsalis pedis pulses are 1+ on the left side.       Posterior tibial pulses are 1+ on the left  side.  Right below-knee amputation.  Stump is healthy.  Pulmonary/Chest: Effort normal and breath sounds normal.  Musculoskeletal: Normal range of motion. He exhibits no edema.  Neurological: He is alert and oriented to person, place, and time.  Skin: Skin is warm and dry. He is not diaphoretic.  Psychiatric: He has a normal mood and affect. His behavior is normal. Judgment and thought content normal.  Vitals reviewed.  BP (!) 106/56 (BP Location: Right Arm)   Pulse 65   Resp 16   Ht  (1.753 m)   Wt 135 lb 3.2 oz (61.3 kg)   BMI 19.97 kg/m   Past Medical History:  Diagnosis Date  . CAD (coronary artery disease)    a. 01/2016 MI/PCI: LM nl, LAD 144m/d (3.25 x 28 Xience DES), 40d, LCX 30ost, OM1 95 (staged - 2.75 x 18 Xience Alpine DES), OM2/3 min irregs, RCA 82m.  . Chronic combined systolic and diastolic CHF (congestive heart failure) (HCC)    a. 01/2016 Echo: Ef 25-30%, Gr1 DD, mild AI, mildly dil LA;  b. 02/2016 Echo: EF 30-35%, apical AK, antsept and ant HK, nl RV fxn.  . CKD (chronic kidney disease), stage III (HCC)   . COPD (chronic obstructive pulmonary disease) (HCC)   . Diabetes mellitus without complication (HCC)   . Diverticulitis   . Essential hypertension   . Ischemic cardiomyopathy    a. 01/2016 Echo: Ef 25-30%;  b. 02/2016 Echo: EF 30-35%.  Marland Kitchen  LBBB (left bundle branch block)   . PAD (peripheral artery disease) (HCC)    a. 10/2015 s/p PTA and stenting of the RCIA, LCIA, and PTA of the LEIA (Schnier); b. 04/2016 PTA or R tibioperoneal trunk & prox PT, PTA/DBA of R Pop and SFA, Viabahn covered stent x 2 to the R SFA and pop (5mm x 25 cm & 5mm x 15cm).  . Tobacco abuse    Social History   Socioeconomic History  . Marital status: Married    Spouse name: Not on file  . Number of children: Not on file  . Years of education: Not on file  . Highest education level: Not on file  Occupational History  . Not on file  Social Needs  . Financial resource strain: Not on  file  . Food insecurity:    Worry: Not on file    Inability: Not on file  . Transportation needs:    Medical: Not on file    Non-medical: Not on file  Tobacco Use  . Smoking status: Current Every Day Smoker    Packs/day: 0.50    Years: 30.00    Pack years: 15.00    Types: Cigarettes  . Smokeless tobacco: Current User  Substance and Sexual Activity  . Alcohol use: No  . Drug use: No  . Sexual activity: Not on file  Lifestyle  . Physical activity:    Days per week: Not on file    Minutes per session: Not on file  . Stress: Not on file  Relationships  . Social connections:    Talks on phone: Not on file    Gets together: Not on file    Attends religious service: Not on file    Active member of club or organization: Not on file    Attends meetings of clubs or organizations: Not on file    Relationship status: Not on file  . Intimate partner violence:    Fear of current or ex partner: Not on file    Emotionally abused: Not on file    Physically abused: Not on file    Forced sexual activity: Not on file  Other Topics Concern  . Not on file  Social History Narrative   Lives in [place] with his wife.  Does not routinely exercise.   Past Surgical History:  Procedure Laterality Date  . AMPUTATION Right 06/06/2016   Procedure: AMPUTATION BELOW KNEE;  Surgeon: Annice Needy, MD;  Location: [facility] ORS;  Service: General;  Laterality: Right;  . CARDIAC CATHETERIZATION N/A 01/13/2016   Procedure: LEFT HEART CATH AND CORONARY ANGIOGRAPHY;  Surgeon: Iran Ouch, MD;  Location: [facility] INVASIVE CV [facility];  Service: Cardiovascular;  Laterality: N/A;  . CARDIAC CATHETERIZATION N/A 01/13/2016   Procedure: Coronary Stent Intervention;  Surgeon: Iran Ouch, MD;  Location: [facility] INV[facility] CV [facility];  Service: Cardiovascular;  Laterality: N/A;  . CARDIAC CATHETERIZATION N/A 01/16/2016   Procedure: Left Heart Cath and Coronary Angiography;  Surgeon: Iran Ouch, MD;  Location: [facility] INVASIVE  CV LAB;  Service: Cardiovascular;  Laterality: N/A;  . INCISION AND DRAINAGE ABSCESS N/A 07/12/2016   Procedure: INCISION AND DRAINAGE GLUTEAL  ABSCESS;  Surgeon: Leafy Ro, MD;  Location: [facility] OR[facility];  Service: General;  Laterality: N/A;  . IRRIGATION AND DEBRIDEMENT FOOT Right 05/08/2016   Procedure: IRRIGATION AND DEBRIDEMENT FOOT WITH PLACEMENT OF ANTIBIOTIC BEADS;  Surgeon: Linus Galas, DPM;  Location: [facility] OR[facility];  Service: Podiatry;  Laterality: Right;  . LOWER EXTREMITY  ANGIOGRAPHY Left 01/29/2017   Procedure: Lower Extremity Angiography;  Surgeon: Renford Dills, MD;  Location: [facility] INVASIVE CV LAB;  Service: Cardiovascular;  Laterality: Left;  . NECK SURGERY    . PERIPHERAL VASCULAR CATHETERIZATION Left 11/01/2015   Procedure: Lower Extremity Angiography;  Surgeon: Renford Dills, MD;  Location: [facility] INVASIVE CV LAB;  Service: Cardiovascular;  Laterality: Left;  . PERIPHERAL VASCULAR CATHETERIZATION Right 05/07/2016   Procedure: Lower Extremity Angiography;  Surgeon: Annice Needy, MD;  Location: [facility] INVASIVE CV LAB;  Service: Cardiovascular;  Laterality: Right;   Family History  Problem Relation Age of Onset  . Intracerebral hemorrhage Mother   . Diabetes Mother   . Cancer Mother   . Diabetes Father    No Known Allergies     Assessment & Plan:  Patient presents for six-month peripheral artery disease follow-up.  Patient seen with a family member.  The patient presents today without complaint.  The patient denies any claudication-like symptoms, rest pain or ulceration to the left lower extremity.  The patient is status post right below the knee amputation.  The patient underwent a left lower extremity ABI which is notable for biphasic tibials.  Left ABI 1.02.  Left toe brachial index is normal.  Left lower extremity arterial duplex with triphasic blood flow transitioning to biphasic at the tibial arteries.  The patient denies any issues with his right below the knee amputation stump.   Patient denies any fever, nausea or vomiting.  1. PAD (peripheral artery disease) (HCC) - Stable Studies reviewed with patient. ABI and left lower extremity arterial duplex is stable. The patient is asymptomatic. His exam is unremarkable There is no indication for intervention at this time Patient should follow-up in 6 months Patient to remain abstinent of tobacco use. I have discussed with the patient at length the risk factors for and pathogenesis of atherosclerotic disease and encouraged a healthy diet, regular exercise regimen and blood pressure / glucose control.  The patient was encouraged to call the office in the interim if he experiences any claudication like symptoms, rest pain or ulcers to his feet / toes.  - VAS [place] ABI WITH/WO TBI; Future - VAS [place] LOWER EXTREMITY ARTERIAL DUPLEX; Future  2. Type 2 diabetes mellitus with complication, with long-term current use of insulin (HCC) - Stable Encouraged good control as its slows the progression of atherosclerotic disease  3. Mixed hyperlipidemia - Stable Encouraged good control as its slows the progression of atherosclerotic disease  Current Outpatient Medications on File Prior to Visit  Medication Sig Dispense Refill  . Amino Acids-Protein Hydrolys (FEEDING SUPPLEMENT, PRO-STAT SUGAR FREE 64,) LIQD Take 30 mLs by mouth 3 (three) times daily.     Marland Kitchen amLODipine (NORVASC) 10 MG tablet Take 10 mg by mouth daily.    Marland Kitchen aspirin EC 81 MG tablet Take by mouth.    Marland Kitchen atorvastatin (LIPITOR) 80 MG tablet Take 1 tablet (80 mg total) by mouth daily at 6 PM. 30 tablet 2  . carvedilol (COREG) 12.5 MG tablet Take 1 tablet (12.5 mg total) by mouth 2 (two) times daily with a meal. 60 tablet 30  . clopidogrel (PLAVIX) 75 MG tablet Take 1 tablet (75 mg total) by mouth daily. 30 tablet 0  . ferrous sulfate 325 (65 FE) MG tablet Take 1 tablet (325 mg total) by mouth 3 (three) times daily with meals.  2  . fluticasone (FLONASE) 50 MCG/ACT nasal spray  Place 2 sprays into both nostrils daily.    Marland Kitchen  furosemide (LASIX) 20 MG tablet Take 20 mg by mouth daily as needed (shortness of breath).    . furosemide (LASIX) 40 MG tablet Take 1 tablet (40 mg total) by mouth daily. 30 tablet 1  . gabapentin (NEURONTIN) 300 MG capsule Take 300 mg by mouth daily as needed (pain).     . hydrALAZINE (APRESOLINE) 25 MG tablet Take 1 tablet (25 mg total) by mouth every 8 (eight) hours. 30 tablet 0  . HYDROcodone-acetaminophen (NORCO/VICODIN) 5-325 MG tablet Take 1 tablet by mouth every 6 (six) hours as needed for moderate pain. 30 tablet 0  . insulin aspart (NOVOLOG) 100 UNIT/ML injection Inject 6 Units into the skin 2 (two) times daily with breakfast and lunch. If reading is between 70-100 give after eating. Hold if reading is less than 70 or if resident will not eat    . Insulin Glargine (LANTUS SOLOSTAR) 100 UNIT/ML Solostar Pen Inject 15 Units into the skin.     . Ipratropium-Albuterol (COMBIVENT RESPIMAT) 20-100 MCG/ACT AERS respimat Inhale 1 puff into the lungs every 4 (four) hours as needed for wheezing.    . Melatonin 5 MG TABS Take 5 mg by mouth at bedtime.     . Multiple Vitamin (MULTIVITAMIN WITH MINERALS) TABS tablet Take 1 tablet by mouth daily. 30 tablet 0  . Multiple Vitamins-Minerals (ZINC PO) Take 1 tablet by mouth daily.    Marland Kitchen omeprazole (PRILOSEC) 20 MG capsule Take 20 mg by mouth daily.    . polyethylene glycol (MIRALAX / GLYCOLAX) packet Take 17 g by mouth daily.    Marland Kitchen senna (SENOKOT) 8.6 MG tablet Take 1 tablet by mouth daily.    . tamsulosin (FLOMAX) 0.4 MG CAPS capsule Take 1 capsule (0.4 mg total) by mouth daily. 30 capsule 0  . vitamin C (ASCORBIC ACID) 500 MG tablet Take 500 mg by mouth daily.      No current facility-administered medications on file prior to visit.    There are no Patient Instructions on file for this visit. No follow-ups on file.  KIMBERLY A STEGMAYER, PA-C

## 2017-10-19 IMAGING — CR DG FOOT COMPLETE 3+V*R*
3 series · 3 of 3 positions shown · non-contrast
Comparison: Plain films left foot 05/03/2016. MRI left ankle
05/08/2016.

CLINICAL DATA: Patient with history of an ulceration on the lateral
aspect of the right foot over the last few months. Status post
resection of the proximal fifth metatarsal, debridement of necrotic
tissue and placement of antibiotic impregnated beads 05/08/2016.
Patient admitted to the hospital 06/01/2016 with fever.

EXAM:
RIGHT FOOT COMPLETE - 3+ VIEW

[foot ap]
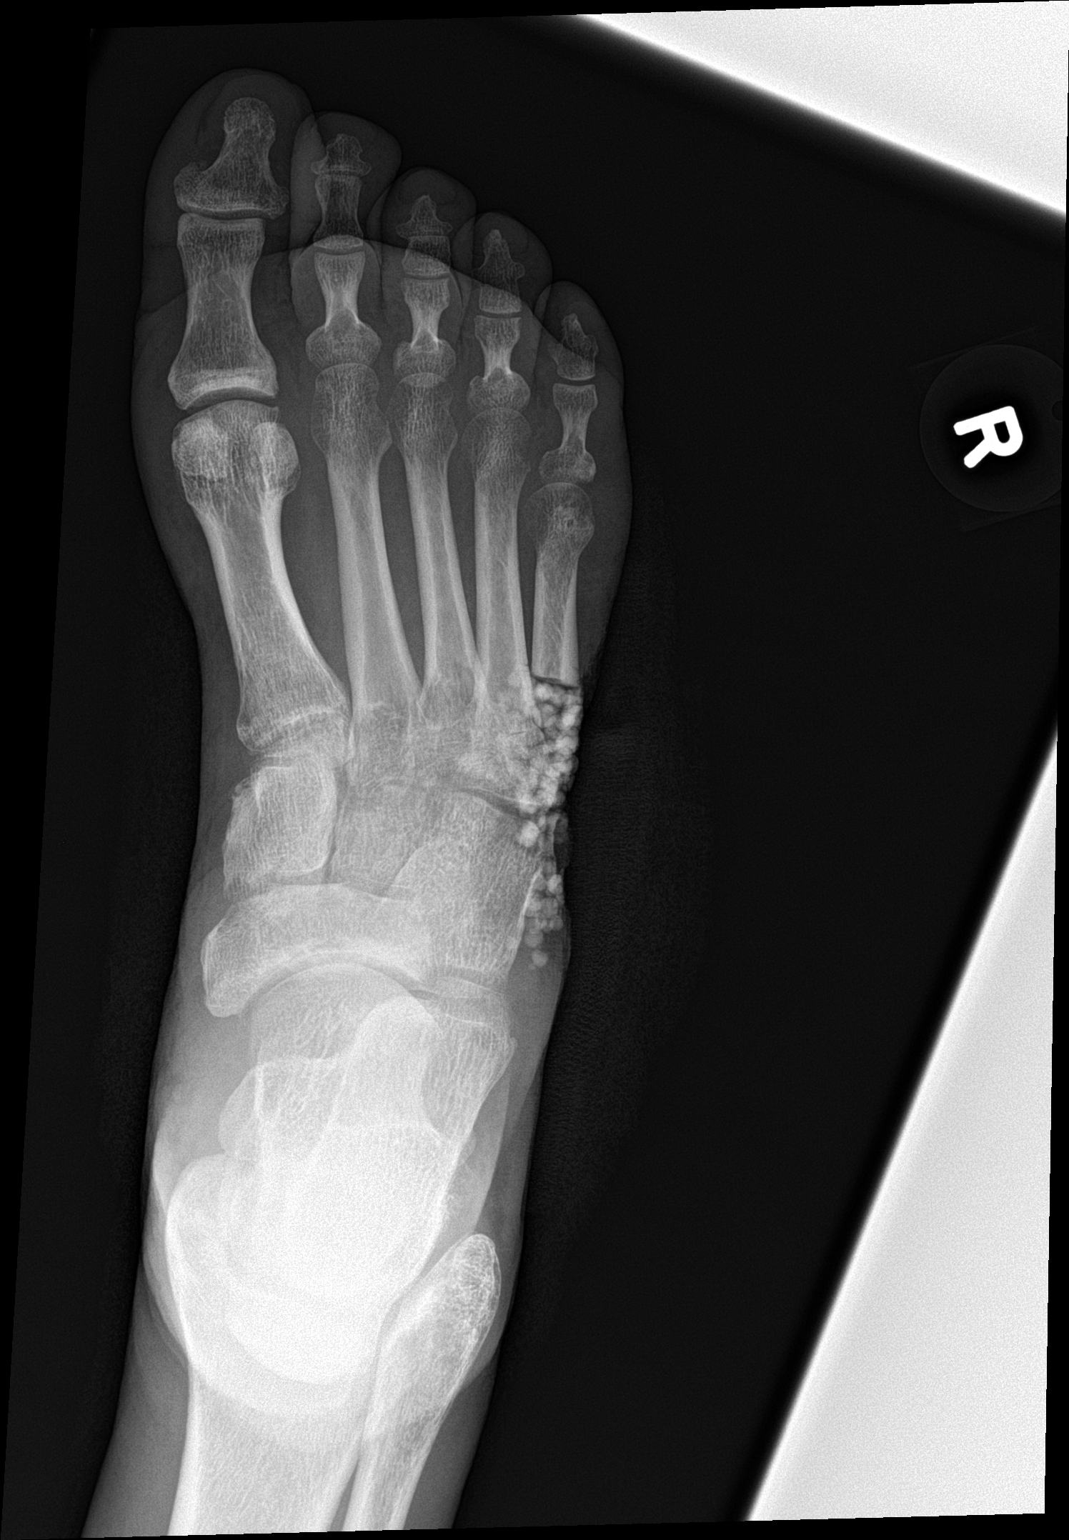

[foot obl]
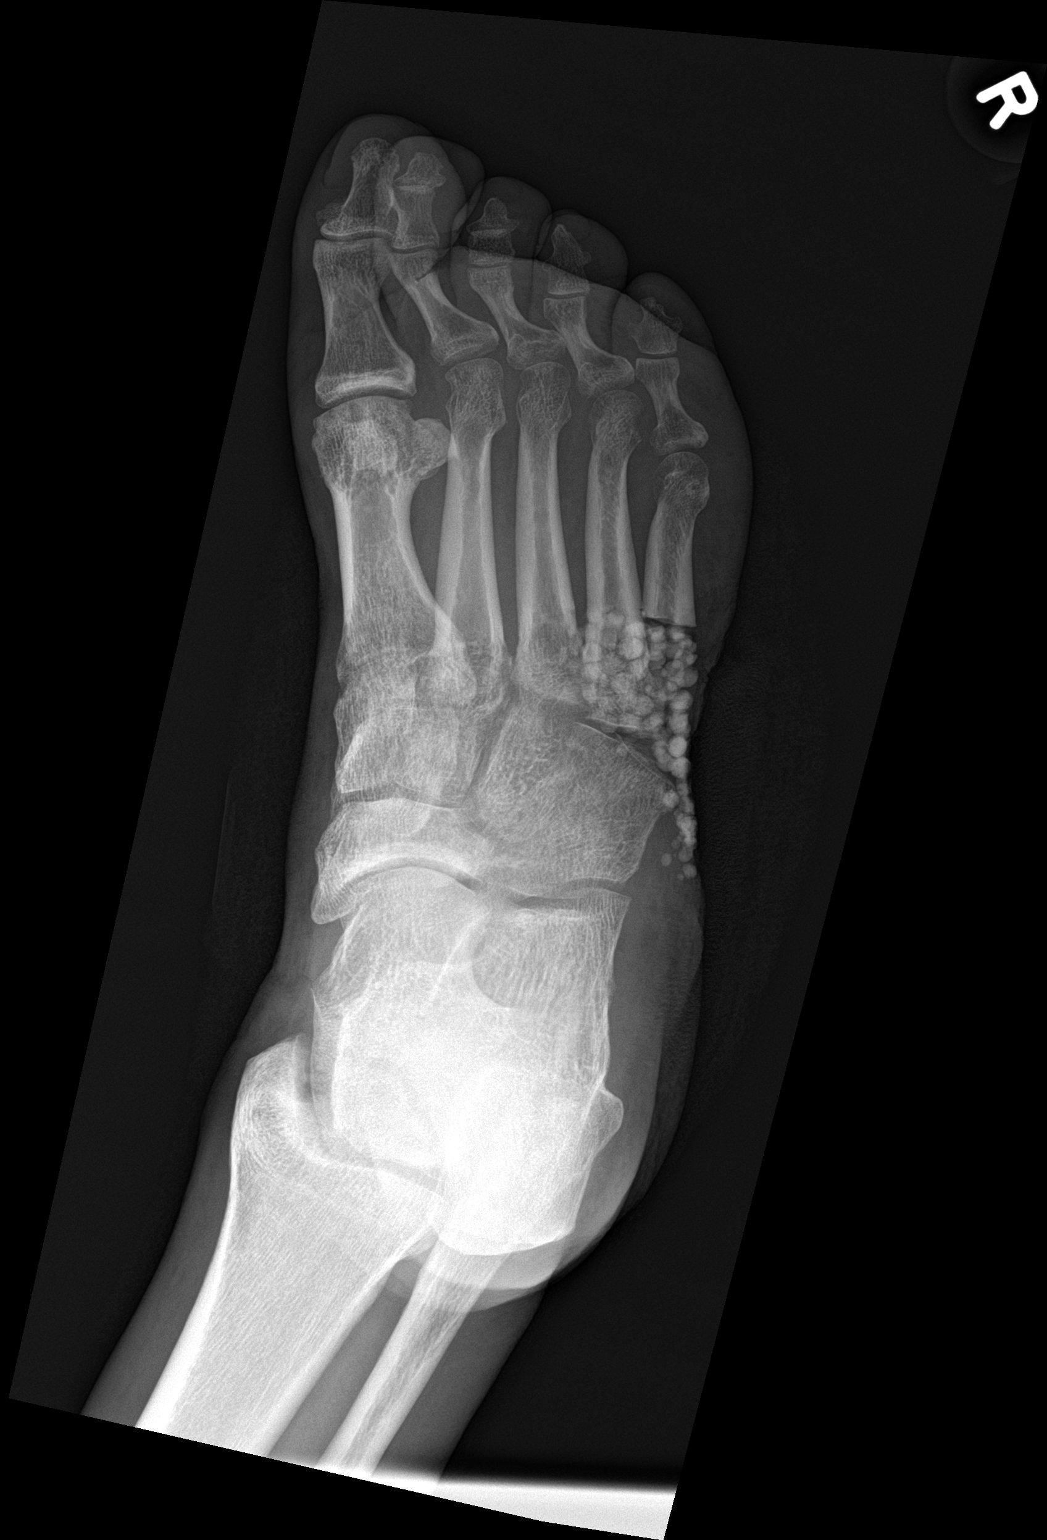

[foot lat]
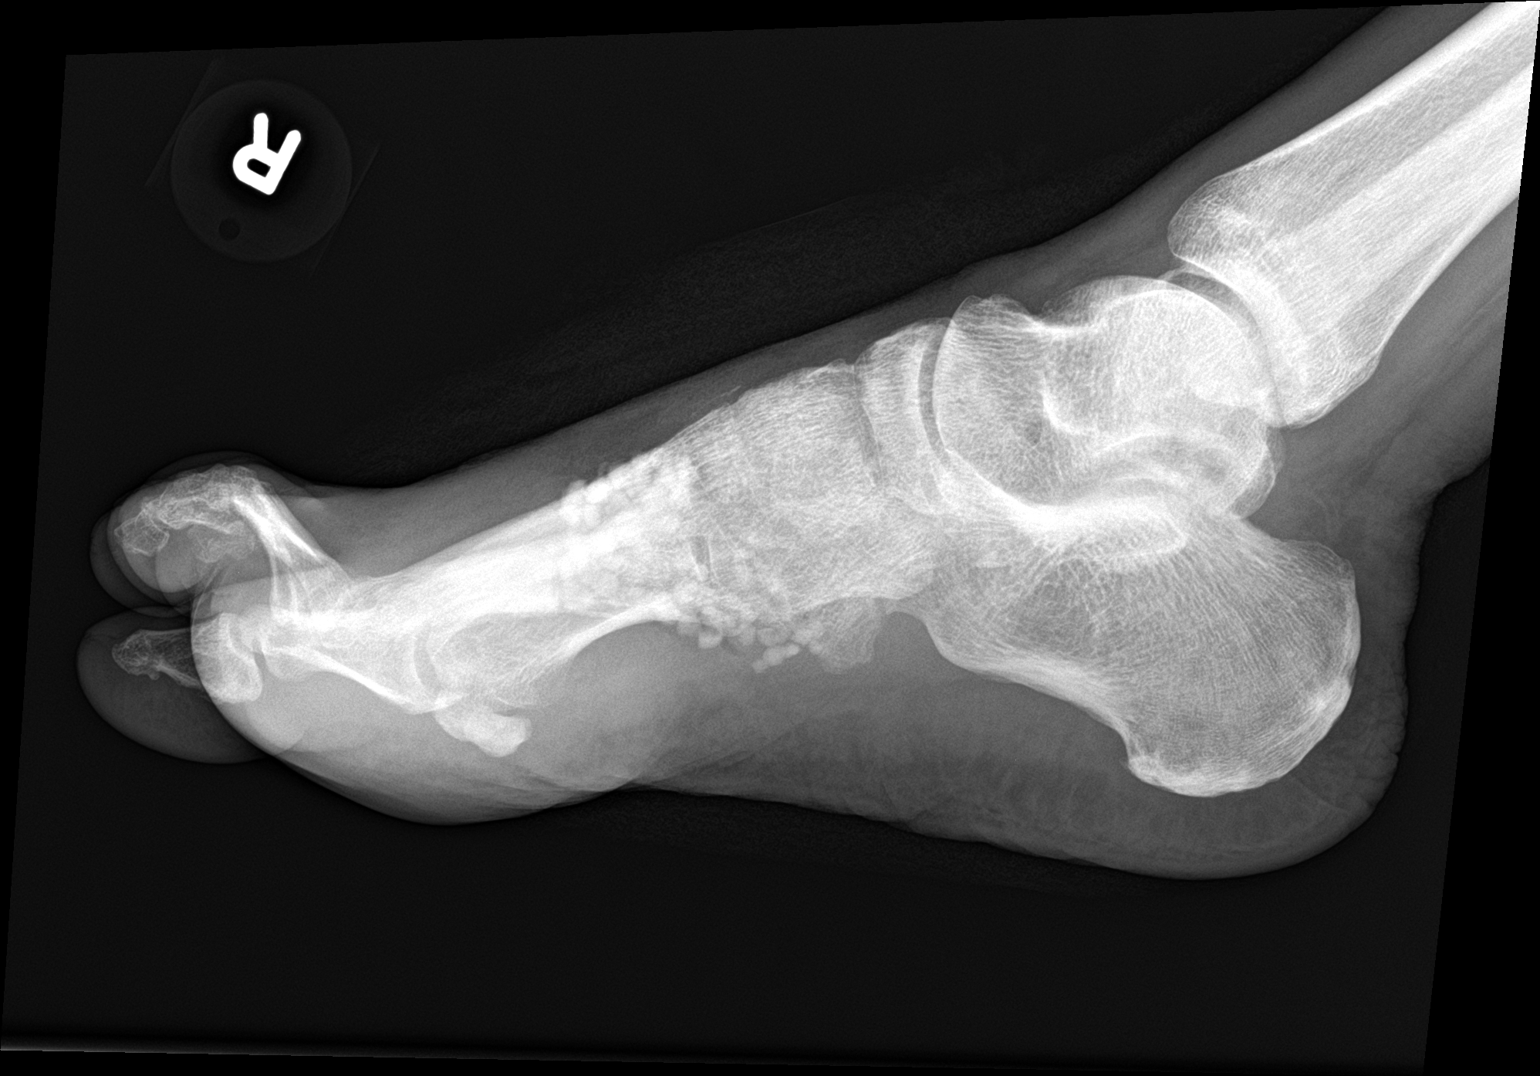

[3 of 3 positions shown; findings below may reference images not displayed]

FINDINGS: Postoperative change of resection of the proximal fifth metatarsal
and placement of antibiotic impregnated beads is identified. Soft
tissues about the foot appear mildly swollen. Skin ulceration versus
marked thinning of cutaneous tissues over the surgical site is
identified. No soft tissue gas collection or bony destructive
change.
IMPRESSION: Status post resection of proximal fifth metatarsal and placement of
antibiotic impregnated beads. No plain film evidence of
osteomyelitis or other acute bony abnormality.

Skin over the patient's surgical site appears markedly thinned.

## 2017-10-28 IMAGING — DX DG CHEST 1V PORT
1 series · 1 of 1 positions shown · non-contrast
Comparison: Radiographs June 01, 2016.

CLINICAL DATA: Cough.

EXAM:
PORTABLE CHEST 1 VIEW

[chest ap]
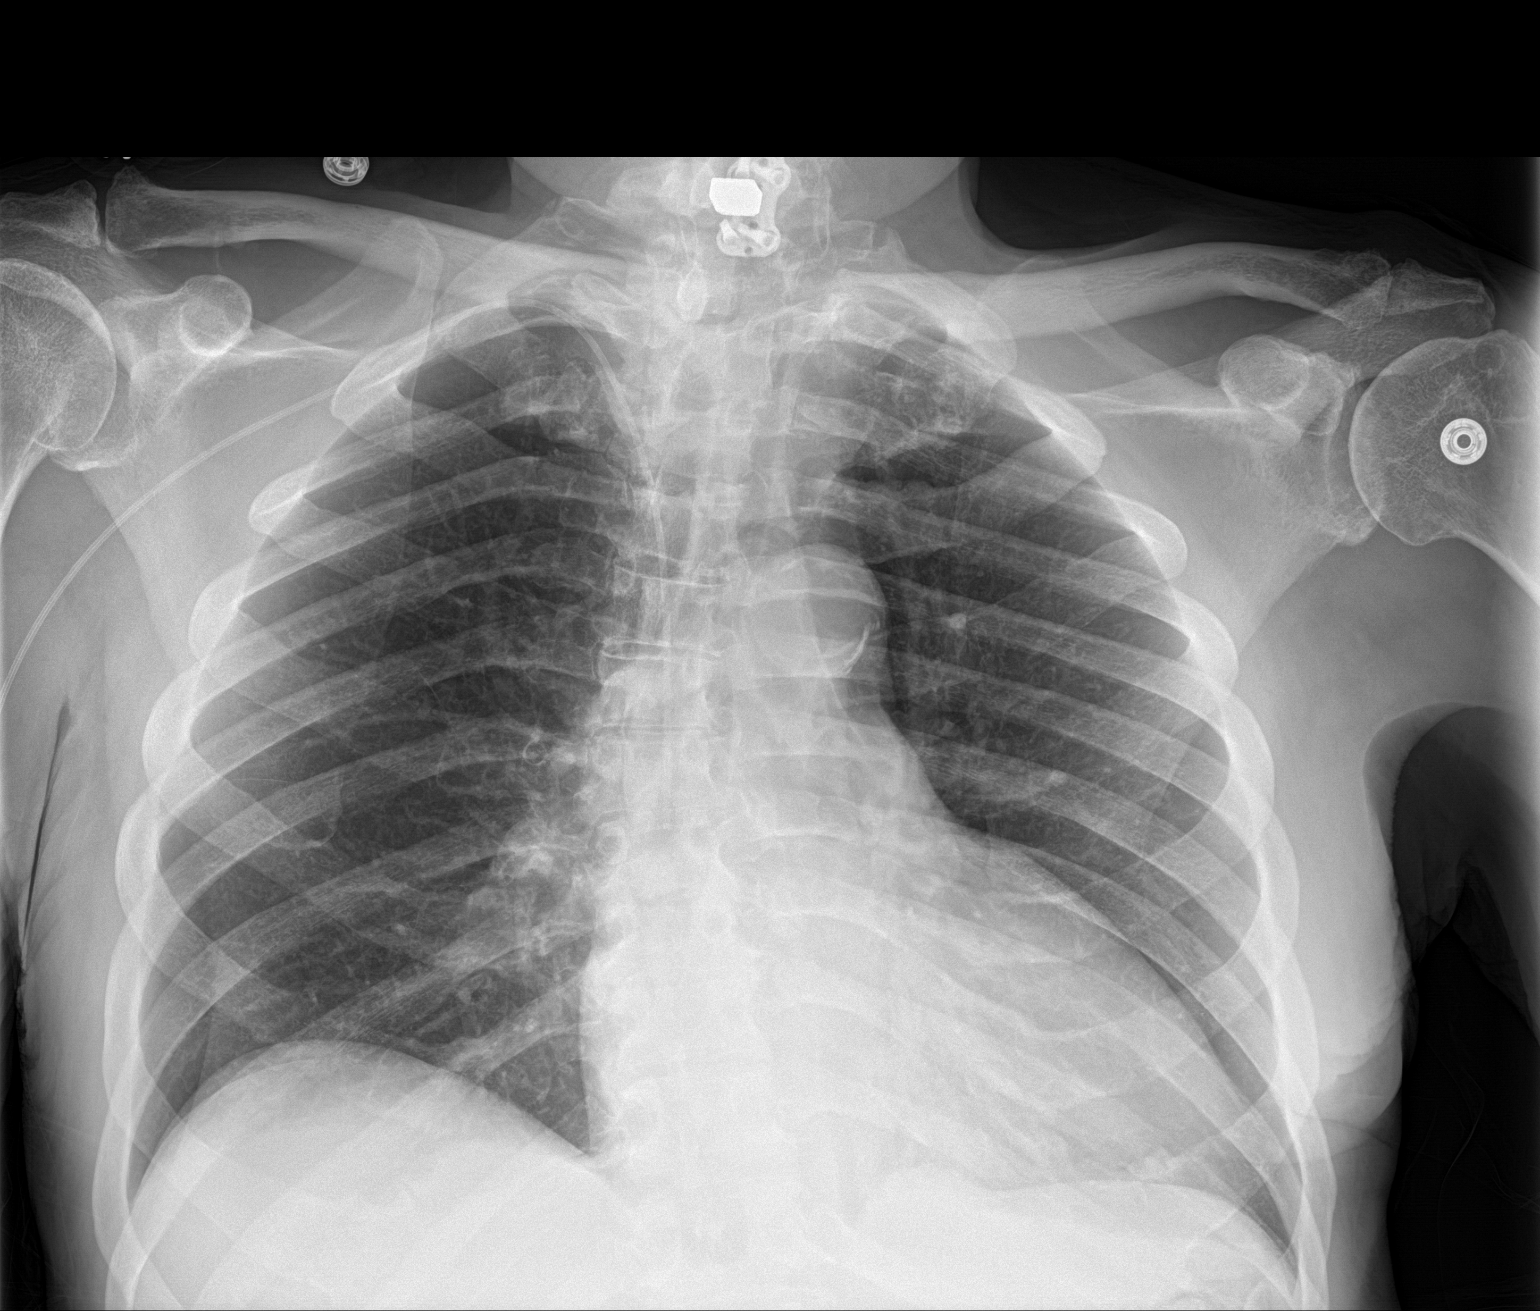

[1 of 1 positions shown; findings below may reference images not displayed]

FINDINGS: The heart size and mediastinal contours are within normal limits.
Both lungs are clear. Right-sided PICC line is unchanged in
position. Atherosclerosis of thoracic aorta is noted. No
pneumothorax or pleural effusion is noted. The visualized skeletal
structures are unremarkable.
IMPRESSION: No acute cardiopulmonary abnormality seen.  Aortic atherosclerosis.

## 2017-11-23 IMAGING — DX DG CHEST 1V PORT
1 series · 1 of 1 positions shown · non-contrast
Comparison: 06/11/2016 chest radiograph.

CLINICAL DATA: Hyperglycemia

EXAM:
PORTABLE CHEST 1 VIEW

[chest ap]
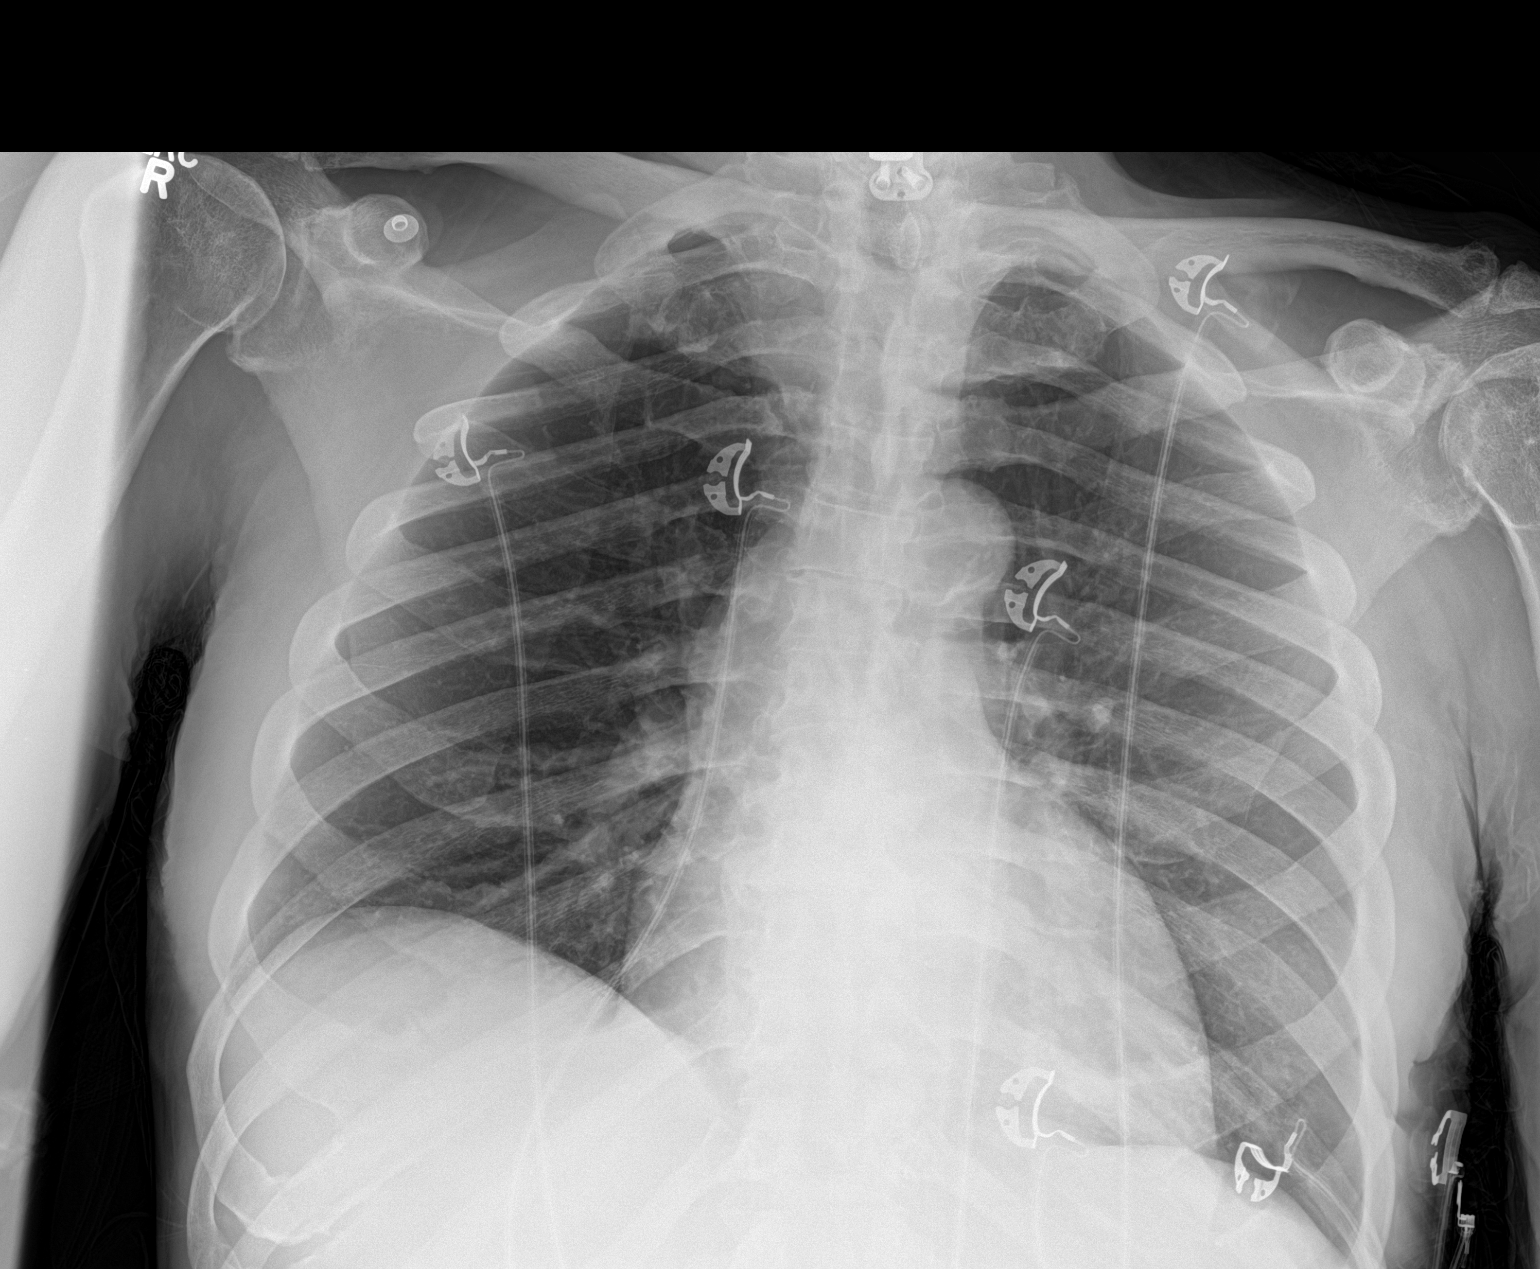

[1 of 1 positions shown; findings below may reference images not displayed]

FINDINGS: Surgical hardware overlies the lower cervical spine. Stable
cardiomediastinal silhouette with normal heart size and aortic
atherosclerosis. No pneumothorax. No pleural effusion. Lungs appear
clear, with no acute consolidative airspace disease and no pulmonary
edema.
IMPRESSION: No active disease.

Aortic atherosclerosis.

## 2018-02-17 ENCOUNTER — Ambulatory Visit (INDEPENDENT_AMBULATORY_CARE_PROVIDER_SITE_OTHER): Payer: Medicare Other

## 2018-02-17 ENCOUNTER — Ambulatory Visit (INDEPENDENT_AMBULATORY_CARE_PROVIDER_SITE_OTHER): Payer: Medicare Other | Admitting: Vascular Surgery

## 2018-02-17 ENCOUNTER — Encounter (INDEPENDENT_AMBULATORY_CARE_PROVIDER_SITE_OTHER): Payer: Self-pay | Admitting: Vascular Surgery

## 2018-02-17 VITALS — BP 150/76 | HR 58 | Resp 19 | Ht 70.0 in | Wt 145.0 lb

## 2018-02-17 DIAGNOSIS — I10 Essential (primary) hypertension: Secondary | ICD-10-CM | POA: Diagnosis not present

## 2018-02-17 DIAGNOSIS — E118 Type 2 diabetes mellitus with unspecified complications: Secondary | ICD-10-CM | POA: Diagnosis not present

## 2018-02-17 DIAGNOSIS — E782 Mixed hyperlipidemia: Secondary | ICD-10-CM

## 2018-02-17 DIAGNOSIS — I739 Peripheral vascular disease, unspecified: Secondary | ICD-10-CM

## 2018-02-17 DIAGNOSIS — I70213 Atherosclerosis of native arteries of extremities with intermittent claudication, bilateral legs: Secondary | ICD-10-CM | POA: Diagnosis not present

## 2018-02-17 DIAGNOSIS — F17210 Nicotine dependence, cigarettes, uncomplicated: Secondary | ICD-10-CM

## 2018-02-17 DIAGNOSIS — I25118 Atherosclerotic heart disease of native coronary artery with other forms of angina pectoris: Secondary | ICD-10-CM | POA: Diagnosis not present

## 2018-02-19 ENCOUNTER — Encounter (INDEPENDENT_AMBULATORY_CARE_PROVIDER_SITE_OTHER): Payer: Self-pay | Admitting: Vascular Surgery

## 2018-02-19 NOTE — Progress Notes (Signed)
MRN : [id number]  Damon [name] is a 67 y.o. ([birth year]) male who presents with chief complaint of  Chief Complaint  Patient presents with  . Follow-up    6 month ABI and Arterial f/u  .  History of Present Illness:   The patient returns to the office for followup and review of the noninvasive studies. There have been no interval changes in lower extremity symptoms. No interval shortening of the patient's claudication distance or development of rest pain symptoms. No new ulcers or wounds have occurred since the last visit.  There have been no significant changes to the patient's overall health care.  The patient denies amaurosis fugax or recent TIA symptoms. There are no recent neurological changes noted. The patient denies history of DVT, PE or superficial thrombophlebitis. The patient denies recent episodes of angina or shortness of breath.     Current Meds  Medication Sig  . Amino Acids-Protein Hydrolys (FEEDING SUPPLEMENT, PRO-STAT SUGAR FREE 64,) LIQD Take 30 mLs by mouth 3 (three) times daily.   Marland Kitchen amLODipine (NORVASC) 10 MG tablet Take 10 mg by mouth daily.  Marland Kitchen atorvastatin (LIPITOR) 80 MG tablet Take 1 tablet (80 mg total) by mouth daily at 6 PM.  . carvedilol (COREG) 12.5 MG tablet Take 1 tablet (12.5 mg total) by mouth 2 (two) times daily with a meal.  . clopidogrel (PLAVIX) 75 MG tablet Take 1 tablet (75 mg total) by mouth daily.  . ferrous sulfate 325 (65 FE) MG tablet Take 1 tablet (325 mg total) by mouth 3 (three) times daily with meals.  . fluticasone (FLONASE) 50 MCG/ACT nasal spray Place 2 sprays into both nostrils daily.  . furosemide (LASIX) 40 MG tablet Take 1 tablet (40 mg total) by mouth daily.  . hydrALAZINE (APRESOLINE) 25 MG tablet Take 1 tablet (25 mg total) by mouth every 8 (eight) hours.  Marland Kitchen HYDROcodone-acetaminophen (NORCO/VICODIN) 5-325 MG tablet Take 1 tablet by mouth every 6 (six) hours as needed for moderate pain.  Marland Kitchen insulin aspart (NOVOLOG) 100  UNIT/ML injection Inject 6 Units into the skin 2 (two) times daily with breakfast and lunch. If reading is between 70-100 give after eating. Hold if reading is less than 70 or if resident will not eat  . Insulin Glargine (LANTUS SOLOSTAR) 100 UNIT/ML Solostar Pen Inject 15 Units into the skin.   . Ipratropium-Albuterol (COMBIVENT RESPIMAT) 20-100 MCG/ACT AERS respimat Inhale 1 puff into the lungs every 4 (four) hours as needed for wheezing.  . Melatonin 5 MG TABS Take 5 mg by mouth at bedtime.   . Multiple Vitamin (MULTIVITAMIN WITH MINERALS) TABS tablet Take 1 tablet by mouth daily.  Marland Kitchen omeprazole (PRILOSEC) 20 MG capsule Take 20 mg by mouth daily.  . pantoprazole (PROTONIX) 20 MG tablet   . polyethylene glycol (MIRALAX / GLYCOLAX) packet Take 17 g by mouth daily.  Marland Kitchen senna (SENOKOT) 8.6 MG tablet Take 1 tablet by mouth daily.  . tamsulosin (FLOMAX) 0.4 MG CAPS capsule Take 1 capsule (0.4 mg total) by mouth daily.    Past Medical History:  Diagnosis Date  . CAD (coronary artery disease)    a. 01/2016 MI/PCI: LM nl, LAD 115m/d (3.25 x 28 Xience DES), 40d, LCX 30ost, OM1 95 (staged - 2.75 x 18 Xience Alpine DES), OM2/3 min irregs, RCA 70m.  . Chronic combined systolic and diastolic CHF (congestive heart failure) (HCC)    a. 01/2016 Echo: Ef 25-30%, Gr1 DD, mild AI, mildly dil LA;  b. 02/2016  Echo: EF 30-35%, apical AK, antsept and ant HK, nl RV fxn.  . CKD (chronic kidney disease), stage III (HCC)   . COPD (chronic obstructive pulmonary disease) (HCC)   . Diabetes mellitus without complication (HCC)   . Diverticulitis   . Essential hypertension   . Ischemic cardiomyopathy    a. 01/2016 Echo: Ef 25-30%;  b. 02/2016 Echo: EF 30-35%.  Marland Kitchen LBBB (left bundle branch block)   . PAD (peripheral artery disease) (HCC)    a. 10/2015 s/p PTA and stenting of the RCIA, LCIA, and PTA of the LEIA (Mekayla Soman); b. 04/2016 PTA or R tibioperoneal trunk & prox PT, PTA/DBA of R Pop and SFA, Viabahn covered stent x 2 to  the R SFA and pop (5mm x 25 cm & 5mm x 15cm).  . Tobacco abuse     Past Surgical History:  Procedure Laterality Date  . AMPUTATION Right 06/06/2016   Procedure: AMPUTATION BELOW KNEE;  Surgeon: Annice Needy, MD;  Location: [facility];  Service: General;  Laterality: Right;  . CARDIAC CATHETERIZATION N/A 01/13/2016   Procedure: LEFT HEART CATH AND CORONARY ANGIOGRAPHY;  Surgeon: Iran Ouch, MD;  Location: [facility];  Service: Cardiovascular;  Laterality: N/A;  . CARDIAC CATHETERIZATION N/A 01/13/2016   Procedure: Coronary Stent Intervention;  Surgeon: Iran Ouch, MD;  Location: [facility];  Service: Cardiovascular;  Laterality: N/A;  . CARDIAC CATHETERIZATION N/A 01/16/2016   Procedure: Left Heart Cath and Coronary Angiography;  Surgeon: Iran Ouch, MD;  Location: [facility];  Service: Cardiovascular;  Laterality: N/A;  . INCISION AND DRAINAGE ABSCESS N/A 07/12/2016   Procedure: INCISION AND DRAINAGE GLUTEAL  ABSCESS;  Surgeon: Leafy Ro, MD;  Location: [facility];  Service: General;  Laterality: N/A;  . IRRIGATION AND DEBRIDEMENT FOOT Right 05/08/2016   Procedure: IRRIGATION AND DEBRIDEMENT FOOT WITH PLACEMENT OF ANTIBIOTIC BEADS;  Surgeon: Linus Galas, DPM;  Location: [facility];  Service: Podiatry;  Laterality: Right;  . LOWER EXTREMITY ANGIOGRAPHY Left 01/29/2017   Procedure: Lower Extremity Angiography;  Surgeon: Renford Dills, MD;  Location: [facility];  Service: Cardiovascular;  Laterality: Left;  . NECK SURGERY    . PERIPHERAL VASCULAR CATHETERIZATION Left 11/01/2015   Procedure: Lower Extremity Angiography;  Surgeon: Renford Dills, MD;  Location: [facility];  Service: Cardiovascular;  Laterality: Left;  . PERIPHERAL VASCULAR CATHETERIZATION Right 05/07/2016   Procedure: Lower Extremity Angiography;  Surgeon: Annice Needy, MD;  Location: [facility];  Service: Cardiovascular;  Laterality: Right;    Social  History Social History   Tobacco Use  . Smoking status: Current Every Day Smoker    Packs/day: 0.50    Years: 30.00    Pack years: 15.00    Types: Cigarettes  . Smokeless tobacco: Current User  Substance Use Topics  . Alcohol use: No  . Drug use: No    Family History Family History  Problem Relation Age of Onset  . Intracerebral hemorrhage Mother   . Diabetes Mother   . Cancer Mother   . Diabetes Father     No Known Allergies   REVIEW OF SYSTEMS (Negative unless checked)  Constitutional: [] Weight loss  [] Fever  [] Chills Cardiac: [] Chest pain   [] Chest pressure   [] Palpitations   [] Shortness of breath when laying flat   [] Shortness of breath with exertion. Vascular:  [x] Pain in legs with walking   [] Pain in legs at rest  [] History of  DVT   [] Phlebitis   [] Swelling in legs   [] Varicose veins   [] Non-healing ulcers Pulmonary:   [] Uses home oxygen   [] Productive cough   [] Hemoptysis   [] Wheeze  [] COPD   [] Asthma Neurologic:  [] Dizziness   [] Seizures   [] History of stroke   [] History of TIA  [] Aphasia   [] Vissual changes   [] Weakness or numbness in arm   [] Weakness or numbness in leg Musculoskeletal:   [] Joint swelling   [x] Joint pain   [x] Low back pain Hematologic:  [] Easy bruising  [] Easy bleeding   [] Hypercoagulable state   [] Anemic Gastrointestinal:  [] Diarrhea   [] Vomiting  [] Gastroesophageal reflux/heartburn   [] Difficulty swallowing. Genitourinary:  [] Chronic kidney disease   [] Difficult urination  [] Frequent urination   [] Blood in urine Skin:  [] Rashes   [] Ulcers  Psychological:  [] History of anxiety   []  History of major depression.  Physical Examination  Vitals:   02/17/18 1132  BP: (!) 150/76  Pulse: (!) 58  Resp: 19  Weight: 145 lb (65.8 kg)  Height: 5\' 10"  (1.778 m)   Body mass index is 20.81 kg/m. Gen: WD/WN, NAD seen in a whellchiar Head: [place]/AT, No temporalis wasting.  Ear/Nose/Throat: Hearing grossly intact, nares w/o erythema or drainage Eyes: PER,  EOMI, sclera nonicteric.  Neck: Supple, no large masses.   Pulmonary:  Good air movement, no audible wheezing bilaterally, no use of accessory muscles.  Cardiac: RRR, no JVD Vascular:  Vessel Right Left  Radial Palpable Palpable  PT Palpable Palpable  DP Palpable Palpable  Gastrointestinal: Non-distended. No guarding/no peritoneal signs.  Musculoskeletal: M/S 5/5 throughout.  No deformity or atrophy.  Neurologic: CN 2-12 intact. Symmetrical.  Speech is fluent. Motor exam as listed above. Psychiatric: Judgment intact, Mood & affect appropriate for pt's clinical situation. Dermatologic: No rashes or ulcers noted.  No changes consistent with cellulitis. Lymph : No lichenification or skin changes of chronic lymphedema.  CBC Lab Results  Component Value Date   WBC 9.3 08/16/2016   HGB 8.4 (L) 08/17/2016   HCT 22.4 (L) 08/16/2016   MCV 90.7 08/16/2016   PLT 271 08/16/2016    BMET    Component Value Date/Time   NA 140 08/19/2016 0527   NA 144 02/01/2016 1528   NA 129 (L) 09/03/2012 0552   K 3.9 08/19/2016 0527   K 4.4 09/03/2012 0552   CL 101 08/19/2016 0527   CL 99 09/03/2012 0552   CO2 34 (H) 08/19/2016 0527   CO2 20 (L) 09/03/2012 0552   GLUCOSE 266 (H) 08/19/2016 0527   GLUCOSE 680 (HH) 09/03/2012 0552   BUN 36 (H) 01/29/2017 0923   BUN 23 02/01/2016 1528   BUN 22 (H) 09/03/2012 0552   CREATININE 1.20 01/29/2017 0923   CREATININE 1.73 (H) 09/03/2012 0552   CALCIUM 8.1 (L) 08/19/2016 0527   CALCIUM 9.5 09/03/2012 0552   GFRNONAA >60 01/29/2017 0923   GFRNONAA 42 (L) 09/03/2012 0552   GFRAA >60 01/29/2017 0923   GFRAA 49 (L) 09/03/2012 0552   CrCl cannot be calculated (Patient's most recent lab result is older than the maximum 21 days allowed.).  COAG Lab Results  Component Value Date   INR 1.17 08/15/2016   INR 1.19 01/13/2016   INR 1.11 01/13/2016    Radiology Vas [place] Vanice Sarahbi With/wo Tbi  Result Date: 02/17/2018 LOWER EXTREMITY DOPPLER STUDY Indications:  Peripheral artery disease.  Vascular Interventions: 11/01/15: Right CIA stent and left EIA PTA;  06/06/16: Right below the knee amputation;                         01/29/2017: Left SFA/popliteal PTA/stent;. Performing Technologist: Jamse Mead RT, RDMS, RVT  Examination Guidelines: A complete evaluation includes at minimum, Doppler waveform signals and systolic blood pressure reading at the level of bilateral brachial, anterior tibial, and posterior tibial arteries, when vessel segments are accessible. Bilateral testing is considered an integral part of a complete examination. Photoelectric Plethysmograph (PPG) waveforms and toe systolic pressure readings are included as required and additional duplex testing as needed. Limited examinations for reoccurring indications may be performed as noted.  ABI Findings: +--------+------------------+-----+--------+--------+ Right   Rt Pressure (mmHg)IndexWaveformComment  +--------+------------------+-----+--------+--------+ [id number]                                     +--------+------------------+-----+--------+--------+ +---------+------------------+-----+---------+---------------------------------+ Left     Lt Pressure (mmHg)IndexWaveform Comment                           +---------+------------------+-----+---------+---------------------------------+ Brachial 145                                                               +---------+------------------+-----+---------+---------------------------------+ ATA      120               0.82 biphasic                                   +---------+------------------+-----+---------+---------------------------------+ PTA      121               0.82 biphasic                                   +---------+------------------+-----+---------+---------------------------------+ Great Toe                       PulsatileUnable to obtain pressure due to                                            anatomic limitation               +---------+------------------+-----+---------+---------------------------------+ +-------+-----------+-----------+------------+------------+ ABI/TBIToday's ABIToday's TBIPrevious ABIPrevious TBI +-------+-----------+-----------+------------+------------+ Right  BKA                   BKA                      +-------+-----------+-----------+------------+------------+ Left   0.82       NA         1.02        0.75         +-------+-----------+-----------+------------+------------+ Left ABIs appear decreased compared to prior study on 08/14/17.  Summary: Left: Resting left ankle-brachial index indicates mild left lower extremity arterial disease.  *See table(s) above for measurements and observations.  Electronically signed by Levora Dredge MD  on 02/17/2018 at 5:40:32 PM.   Final    Vas [place] Lower Extremity Arterial Duplex  Result Date: 02/17/2018 LOWER EXTREMITY ARTERIAL DUPLEX STUDY Indications: Peripheral artery disease.  Vascular Interventions: 11/01/15: Right CIA stent and left EIA PTA;                         06/06/16: Right below the knee amputation;                         01/29/2017: Left SFA/popliteal PTA/stent;. Current ABI:            Righ=Amputation; Left=0.82 Performing Technologist: Jamse Mead RT, RDMS, RVT  Examination Guidelines: A complete evaluation includes B-mode imaging, spectral Doppler, color Doppler, and power Doppler as needed of all accessible portions of each vessel. Bilateral testing is considered an integral part of a complete examination. Limited examinations for reoccurring indications may be performed as noted.  Left Duplex Findings: +-----------+--------+-----+--------+--------+--------+            PSV cm/sRatioStenosisWaveformComments +-----------+--------+-----+--------+--------+--------+ EIA Distal 136                  biphasic         +-----------+--------+-----+--------+--------+--------+  CFA Mid    192                  biphasic         +-----------+--------+-----+--------+--------+--------+ DFA        105                  biphasic         +-----------+--------+-----+--------+--------+--------+ SFA Prox   99                   biphasicstent    +-----------+--------+-----+--------+--------+--------+ SFA Mid    151                  biphasicstent    +-----------+--------+-----+--------+--------+--------+ SFA Distal 81                   biphasicstent    +-----------+--------+-----+--------+--------+--------+ POP Prox   124                  biphasic         +-----------+--------+-----+--------+--------+--------+ POP Distal 85                   biphasic         +-----------+--------+-----+--------+--------+--------+ ATA Distal 53                   biphasic         +-----------+--------+-----+--------+--------+--------+ PTA Distal 49                   biphasic         +-----------+--------+-----+--------+--------+--------+ PERO Distal74                   biphasic         +-----------+--------+-----+--------+--------+--------+  Summary: Left: Normal examination. No evidence of arterial occlusive disease. Patent stent with no evidence of stenosis in the SFA/popliteal artery artery.  See table(s) above for measurements and observations. Electronically signed by Levora Dredge MD on 02/17/2018 at 5:40:41 PM.    Final       Assessment/Plan 1. Atherosclerosis of native artery of both lower extremities with intermittent claudication (HCC)  Recommend:  The patient has evidence of atherosclerosis of the  lower extremities with claudication.  The patient does not voice lifestyle limiting changes at this point in time.  Noninvasive studies do not suggest clinically significant change.  No invasive studies, angiography or surgery at this time The patient should continue walking and begin a more formal exercise program.  The patient  should continue antiplatelet therapy and aggressive treatment of the lipid abnormalities  No changes in the patient's medications at this time  The patient should continue wearing graduated compression socks 10-15 mmHg strength to control the mild edema.   - VAS [place] LOWER EXTREMITY ARTERIAL DUPLEX; Future - VAS [place] ABI WITH/WO TBI; Future  2. Coronary artery disease of native heart with stable angina pectoris, unspecified vessel or lesion type (HCC) Continue cardiac and antihypertensive medications as already ordered and reviewed, no changes at this time.  Continue statin as ordered and reviewed, no changes at this time  Nitrates PRN for chest pain   3. Essential hypertension Continue antihypertensive medications as already ordered, these medications have been reviewed and there are no changes at this time.   4. Diabetes mellitus type 2 with complications (HCC) Continue hypoglycemic medications as already ordered, these medications have been reviewed and there are no changes at this time.  Hgb A1C to be monitored as already arranged by primary service   5. Mixed hyperlipidemia Continue statin as ordered and reviewed, no changes at this time     Levora Dredge, MD  02/19/2018 8:59 PM

## 2018-08-21 ENCOUNTER — Encounter (INDEPENDENT_AMBULATORY_CARE_PROVIDER_SITE_OTHER): Payer: Medicare Other

## 2018-08-21 ENCOUNTER — Ambulatory Visit (INDEPENDENT_AMBULATORY_CARE_PROVIDER_SITE_OTHER): Payer: Medicare Other | Admitting: Nurse Practitioner

## 2018-09-22 ENCOUNTER — Encounter (INDEPENDENT_AMBULATORY_CARE_PROVIDER_SITE_OTHER): Payer: Medicare Other

## 2018-09-22 ENCOUNTER — Ambulatory Visit (INDEPENDENT_AMBULATORY_CARE_PROVIDER_SITE_OTHER): Payer: Medicare Other | Admitting: Nurse Practitioner

## 2018-10-20 ENCOUNTER — Ambulatory Visit (INDEPENDENT_AMBULATORY_CARE_PROVIDER_SITE_OTHER): Payer: Medicare Other | Admitting: Nurse Practitioner

## 2018-10-20 ENCOUNTER — Encounter (INDEPENDENT_AMBULATORY_CARE_PROVIDER_SITE_OTHER): Payer: Medicare Other

## 2019-01-15 ENCOUNTER — Other Ambulatory Visit: Payer: Self-pay

## 2019-01-15 ENCOUNTER — Telehealth: Payer: Self-pay

## 2019-01-15 DIAGNOSIS — Z1211 Encounter for screening for malignant neoplasm of colon: Secondary | ICD-10-CM

## 2019-01-15 NOTE — Telephone Encounter (Signed)
Gastroenterology Pre-Procedure Review  Request Date: 01/26/19 Requesting Physician: Dr. Marius Ditch  PATIENT REVIEW QUESTIONS: The patient responded to the following health history questions as indicated:    1. Are you having any GI issues? no 2. Do you have a personal history of Polyps? yes (doesnt recall last colonoscopy) 3. Do you have a family history of Colon Cancer or Polyps? no 4. Diabetes Mellitus? yes (type 2 insulin) 5. Joint replacements in the past 12 months?no 6. Major health problems in the past 3 months?no cardiac devices, cardiac health conditions noted.  Cardiac request to be sent to cardiologist noted in chart. 7. Any artificial heart valves, MVP, or defibrillator?see above note    MEDICATIONS & ALLERGIES:    Patient reports the following regarding taking any anticoagulation/antiplatelet therapy:   Plavix, Coumadin, Eliquis, Xarelto, Lovenox, Pradaxa, Brilinta, or Effient? Patient states he is not, however chart states otherwise-will send a request to vascular to make sure Aspirin? no  Patient confirms/reports the following medications:  Current Outpatient Medications  Medication Sig Dispense Refill  . Amino Acids-Protein Hydrolys (FEEDING SUPPLEMENT, PRO-STAT SUGAR FREE 64,) LIQD Take 30 mLs by mouth 3 (three) times daily.     Marland Kitchen amLODipine (NORVASC) 10 MG tablet Take 10 mg by mouth daily.    Marland Kitchen atorvastatin (LIPITOR) 80 MG tablet Take 1 tablet (80 mg total) by mouth daily at 6 PM. 30 tablet 2  . carvedilol (COREG) 12.5 MG tablet Take 1 tablet (12.5 mg total) by mouth 2 (two) times daily with a meal. 60 tablet 30  . clopidogrel (PLAVIX) 75 MG tablet Take 1 tablet (75 mg total) by mouth daily. 30 tablet 0  . ferrous sulfate 325 (65 FE) MG tablet Take 1 tablet (325 mg total) by mouth 3 (three) times daily with meals.  2  . fluticasone (FLONASE) 50 MCG/ACT nasal spray Place 2 sprays into both nostrils daily.    . furosemide (LASIX) 40 MG tablet Take 1 tablet (40 mg total) by  mouth daily. 30 tablet 1  . hydrALAZINE (APRESOLINE) 25 MG tablet Take 1 tablet (25 mg total) by mouth every 8 (eight) hours. 30 tablet 0  . HYDROcodone-acetaminophen (NORCO/VICODIN) 5-325 MG tablet Take 1 tablet by mouth every 6 (six) hours as needed for moderate pain. 30 tablet 0  . insulin aspart (NOVOLOG) 100 UNIT/ML injection Inject 6 Units into the skin 2 (two) times daily with breakfast and lunch. If reading is between 70-100 give after eating. Hold if reading is less than 70 or if resident will not eat    . Insulin Glargine (LANTUS SOLOSTAR) 100 UNIT/ML Solostar Pen Inject 15 Units into the skin.     . Ipratropium-Albuterol (COMBIVENT RESPIMAT) 20-100 MCG/ACT AERS respimat Inhale 1 puff into the lungs every 4 (four) hours as needed for wheezing.    . Melatonin 5 MG TABS Take 5 mg by mouth at bedtime.     . Multiple Vitamin (MULTIVITAMIN WITH MINERALS) TABS tablet Take 1 tablet by mouth daily. 30 tablet 0  . omeprazole (PRILOSEC) 20 MG capsule Take 20 mg by mouth daily.    . pantoprazole (PROTONIX) 20 MG tablet     . polyethylene glycol (MIRALAX / GLYCOLAX) packet Take 17 g by mouth daily.    Marland Kitchen senna (SENOKOT) 8.6 MG tablet Take 1 tablet by mouth daily.    . tamsulosin (FLOMAX) 0.4 MG CAPS capsule Take 1 capsule (0.4 mg total) by mouth daily. 30 capsule 0   No current facility-administered medications for this visit.  Patient confirms/reports the following allergies:  No Known Allergies  No orders of the defined types were placed in this encounter.   AUTHORIZATION INFORMATION Primary Insurance: 1D#: Group #:  Secondary Insurance: 1D#: Group #:  SCHEDULE INFORMATION: Date: 01/26/19 Time: Location:[facility]

## 2019-01-16 ENCOUNTER — Telehealth: Payer: Self-pay

## 2019-01-16 ENCOUNTER — Other Ambulatory Visit: Payer: Self-pay

## 2019-01-19 NOTE — Telephone Encounter (Signed)
Opened in error

## 2019-01-22 ENCOUNTER — Other Ambulatory Visit: Admission: RE | Admit: 2019-01-22 | Payer: Medicare Other | Source: Ambulatory Visit

## 2019-01-23 ENCOUNTER — Telehealth: Payer: Self-pay

## 2019-01-23 NOTE — Telephone Encounter (Signed)
LVM for pt to call office back in regards to rescheduling his Monday 01/26/19 colonoscopy due to COVID test not being completed on Thursday.  Thanks  [employer]

## 2019-01-26 ENCOUNTER — Ambulatory Visit: Admission: RE | Admit: 2019-01-26 | Payer: Medicare Other | Source: Home / Self Care | Admitting: Gastroenterology

## 2019-01-26 ENCOUNTER — Encounter: Admission: RE | Payer: Self-pay | Source: Home / Self Care

## 2019-01-26 SURGERY — COLONOSCOPY WITH PROPOFOL
Anesthesia: General

## 2019-02-02 ENCOUNTER — Telehealth: Payer: Self-pay

## 2019-02-02 NOTE — Telephone Encounter (Signed)
Per Cardiac Clearance Request received from Dr. Candis Schatz "Clearance can not be provided at this time due to patient needing cardiology evaluation prior to clearance being given".  Thanks [employer]

## 2019-02-27 ENCOUNTER — Encounter: Payer: Self-pay | Admitting: Nurse Practitioner

## 2019-02-27 ENCOUNTER — Non-Acute Institutional Stay: Payer: Medicare Other | Admitting: Nurse Practitioner

## 2019-02-27 VITALS — BP 132/70 | HR 74 | Temp 98.0°F | Resp 18 | Wt 155.1 lb

## 2019-02-27 DIAGNOSIS — Z515 Encounter for palliative care: Secondary | ICD-10-CM

## 2019-02-27 DIAGNOSIS — I509 Heart failure, unspecified: Secondary | ICD-10-CM

## 2019-02-27 NOTE — Progress Notes (Addendum)
[facility] Consult Note Telephone: [phone]  Fax: [phone]  PATIENT NAME: Damon [name] Ruscitti DOB: 1950-08-11 MRN: [id number]  PRIMARY CARE PROVIDER:   Dr Hodges/[facility] REFERRING PROVIDER:  Dr Yetta FlockHodges RESPONSIBLE PARTY:   Self Darrell Jewelleanor Lee spouse [phone] I was asked to see Damon [name] by Dr Yetta FlockHodges for Palliative care consult for goals of care   RECOMMENDATIONS and PLAN:  1.ACP: currently a full code with wishes to be a DNR after discussion with Ms. Damon [name], his wife. Wishes are to treat what is treatable.  2. Palliative care encounter Palliative medicine team will continue to support patient, patient's family, and medical team. Visit consisted of counseling and education dealing with the complex and emotionally intense issues of symptom management and palliative care in the setting of serious and potentially life-threatening illness  I spent 65 minutes providing this consultation,  from 10:50am to 11:55am. More than 50% of the time in this consultation was spent coordinating communication.   HISTORY OF PRESENT ILLNESS:  Damon [name] Northington is a 68 y.o. year old male with multiple medical problems including Ischemic cardiomyopathy, Chronic diastolic congestive heart failure, coronary artery disease, diabetes, chronic kidney disease, COPD, peripheral vascular disease, osteoarthritis, BPH, hyperlipidemia, anemia of chronic disease, Right BKA, tobacco abuse. Damon [name] resides [facility] after a hospitalization and short-term rehab stint for right BKA. He was unable to return home as a prosthetic he received had great difficulty with fitting. With the prosthetic he was unable to ambulate. Without being able to ambulate he transition to long-term care as he needed to ambulate to be able to return home. Mrs Damon [name], his wife has been visiting daily up until this past March / 2020 due  to covid-19 no visitor policy. Mrs. Damon [name] does come to the window to visit Damon [name] They talk on the phone often. Damon [name] does most of his own adl's, toilets himself, feeds himself. He is mobile in his wheelchair and is able to transfer. Last provider note for COPD 10 / 12 / 2020 with no changes to plan of care. Damon [name] is able to verbalize his needs. At present Damon [name] is sitting in his room in the wheelchair. He appears comfortable. No visitors present. I visited and observe Damon [name] We talked about purpose of palliative care visit.  Damon [name] in agreement. We talked about past medical history in the setting of chronic disease progression. We talked about challenges with loss of Independence and now residing in [facility]. Mr Damon [name] endorse is that he is able to walk a few steps with his walker but with the gym being closed he has not had as many opportunities. We talked about his daily routine. Life review completed. We talked about symptoms of pain and shortness of breath which he denies. We talked about appetite and food that he likes. We talked about medical goals of care and cleaning aggressive versus comfort care. We talked about code status as he currently is a full code. Mr Damon [name] endorses that he would not want to have CPR. If he is past let his body rest in peace. We talked about completing a DNR. Damon [name] asked if I could contact his wife to update her on wishes for DNR prior to changing his code status. Palliative care in agreement and attempted to contact Mrs. Damon [name], will continue to try. We talked about role of  palliative care and plan of care. We talked about quality of life. Mr. Hardgrove does spend a fair amount of time on the smoking patio. Mr. Trevino declines smoking cessation. We talked about follow-up palliative care visit in one month if needed or sooner should he declined. Mr Kerth in agreement. Mr. Truxillo does continue to be stable. We talked about coping  strategies. Therapeutic listening and emotional support provided. Questions answered to satisfaction. Will keep attempting to contact his wife and if an agreement will change his code status to DNR, review most form. I updated nursing staff and any changes to current goals are plan of care.  8 / 6 / 2020 weight 139.7 lbs 9 / 04/2018 weight 139.5 lbs 10 / 13 / 2020 weight 154.7 lbs 11 / 5 / 2020 weight 155.1 lbs.   Palliative Care was asked to help address goals of care.   CODE STATUS: Full code  PPS: 50% HOSPICE ELIGIBILITY/DIAGNOSIS: TBD  PAST MEDICAL HISTORY:  Past Medical History:  Diagnosis Date  . CAD (coronary artery disease)    a. 01/2016 MI/PCI: LM nl, LAD 157m/d (3.25 x 28 Xience DES), 40d, LCX 30ost, OM1 95 (staged - 2.75 x 18 Xience Alpine DES), OM2/3 min irregs, RCA 44m.  . Chronic combined systolic and diastolic CHF (congestive heart failure) (HCC)    a. 01/2016 Echo: Ef 25-30%, Gr1 DD, mild AI, mildly dil LA;  b. 02/2016 Echo: EF 30-35%, apical AK, antsept and ant HK, nl RV fxn.  . CKD (chronic kidney disease), stage III   . COPD (chronic obstructive pulmonary disease) (HCC)   . Diabetes mellitus without complication (HCC)   . Diverticulitis   . Essential hypertension   . Ischemic cardiomyopathy    a. 01/2016 Echo: Ef 25-30%;  b. 02/2016 Echo: EF 30-35%.  Marland Kitchen LBBB (left bundle branch block)   . PAD (peripheral artery disease) (HCC)    a. 10/2015 s/p PTA and stenting of the RCIA, LCIA, and PTA of the LEIA (Schnier); b. 04/2016 PTA or R tibioperoneal trunk & prox PT, PTA/DBA of R Pop and SFA, Viabahn covered stent x 2 to the R SFA and pop (20mm x 25 cm & 84mm x 15cm).  . Tobacco abuse     SOCIAL HX:  Social History   Tobacco Use  . Smoking status: Current Every Day Smoker    Packs/day: 0.50    Years: 30.00    Pack years: 15.00    Types: Cigarettes  . Smokeless tobacco: Current User  Substance Use Topics  . Alcohol use: No    ALLERGIES: No Known Allergies    PERTINENT MEDICATIONS:  Outpatient Encounter Medications as of 02/27/2019  Medication Sig  . Amino Acids-Protein Hydrolys (FEEDING SUPPLEMENT, PRO-STAT SUGAR FREE 64,) LIQD Take 30 mLs by mouth 3 (three) times daily.   Marland Kitchen amLODipine (NORVASC) 10 MG tablet Take 10 mg by mouth daily.  Marland Kitchen atorvastatin (LIPITOR) 80 MG tablet Take 1 tablet (80 mg total) by mouth daily at 6 PM.  . carvedilol (COREG) 12.5 MG tablet Take 1 tablet (12.5 mg total) by mouth 2 (two) times daily with a meal.  . clopidogrel (PLAVIX) 75 MG tablet Take 1 tablet (75 mg total) by mouth daily.  . ferrous sulfate 325 (65 FE) MG tablet Take 1 tablet (325 mg total) by mouth 3 (three) times daily with meals.  . fluticasone (FLONASE) 50 MCG/ACT nasal spray Place 2 sprays into both nostrils daily.  . furosemide (LASIX) 40 MG tablet Take 1  tablet (40 mg total) by mouth daily.  . hydrALAZINE (APRESOLINE) 25 MG tablet Take 1 tablet (25 mg total) by mouth every 8 (eight) hours.  Marland Kitchen HYDROcodone-acetaminophen (NORCO/VICODIN) 5-325 MG tablet Take 1 tablet by mouth every 6 (six) hours as needed for moderate pain.  Marland Kitchen insulin aspart (NOVOLOG) 100 UNIT/ML injection Inject 6 Units into the skin 2 (two) times daily with breakfast and lunch. If reading is between 70-100 give after eating. Hold if reading is less than 70 or if resident will not eat  . Insulin Glargine (LANTUS SOLOSTAR) 100 UNIT/ML Solostar Pen Inject 15 Units into the skin.   . Ipratropium-Albuterol (COMBIVENT RESPIMAT) 20-100 MCG/ACT AERS respimat Inhale 1 puff into the lungs every 4 (four) hours as needed for wheezing.  . Melatonin 5 MG TABS Take 5 mg by mouth at bedtime.   . Multiple Vitamin (MULTIVITAMIN WITH MINERALS) TABS tablet Take 1 tablet by mouth daily.  Marland Kitchen omeprazole (PRILOSEC) 20 MG capsule Take 20 mg by mouth daily.  . pantoprazole (PROTONIX) 20 MG tablet   . polyethylene glycol (MIRALAX / GLYCOLAX) packet Take 17 g by mouth daily.  Marland Kitchen senna (SENOKOT) 8.6 MG tablet Take 1  tablet by mouth daily.  . tamsulosin (FLOMAX) 0.4 MG CAPS capsule Take 1 capsule (0.4 mg total) by mouth daily.   No facility-administered encounter medications on file as of 02/27/2019.     PHYSICAL EXAM:   General: NAD, pleasant male Cardiovascular: regular rate and rhythm Pulmonary: clear ant fields Abdomen: soft, nontender, + bowel sounds Extremities: no edema, no joint deformities; Right BKA Neurological: Walker;   Alyshia Kernan Ihor Gully, NP

## 2019-03-02 ENCOUNTER — Other Ambulatory Visit: Payer: Self-pay

## 2019-03-13 ENCOUNTER — Encounter: Payer: Self-pay | Admitting: Nurse Practitioner

## 2019-03-13 ENCOUNTER — Non-Acute Institutional Stay: Payer: Medicare Other | Admitting: Nurse Practitioner

## 2019-03-13 ENCOUNTER — Other Ambulatory Visit: Payer: Self-pay

## 2019-03-13 VITALS — BP 136/74 | HR 82 | Temp 98.0°F | Resp 20 | Wt 155.1 lb

## 2019-03-13 DIAGNOSIS — Z515 Encounter for palliative care: Secondary | ICD-10-CM

## 2019-03-13 DIAGNOSIS — I509 Heart failure, unspecified: Secondary | ICD-10-CM

## 2019-03-13 NOTE — Progress Notes (Signed)
[facility] Pall[facility] Consult Note Telephone: [phone]  Fax: [phone]  PATIENT NAME: Damon [name] DOB: 06/10/1950 MRN: [id number]   PRIMARY CARE PROVIDER:   Dr Hodges/[facility] REFERRING PROVIDER:  Dr Nyra Capes RESPONSIBLE PARTY:   Self Damon [name] spouse [phone] I was asked to see Damon [name] by Dr Nyra Capes for Palliative care consult for goals of care   RECOMMENDATIONS and PLAN:  1.ACP: currently a full code with wishes to be a DNR after discussion with [name], his wife. Wishes are to treat what is treatable.  2. Palliative care encounter Palliative medicine team will continue to support patient, patient's family, and medical team. Visit consisted of counseling and education dealing with the complex and emotionally intense issues of symptom management and palliative care in the setting of serious and potentially life-threatening illness  I spent 45 minutes providing this consultation,  from 12:30pm to 1:15. More than 50% of the time in this consultation was spent coordinating communication.   HISTORY OF PRESENT ILLNESS:  DEMARKO ZEIMET is a 68 y.o. year old male with multiple medical problems including Ischemic cardiomyopathy, Chronic diastolic congestive heart failure, coronary artery disease, diabetes, chronic kidney disease, COPD, peripheral vascular disease, osteoarthritis, BPH, hyperlipidemia, anemia of chronic disease, Right BKA, tobacco abuse.  Damon [name] continues to reside in [place] at [facility]. Damon [name] does transfer to the wheelchair,  perform some adl's and is fairly independent. Damon [name] does continue to go to the smoking patio. He feeds himself and appetite has been good. Palliative care visit for today to revisit code status as he currently is a full code. Damon [name] is able to verbalize his needs. At present Damon [name] is sitting in the wheelchair in his room. He  appears comfortable. No visitors present. I visited and observed Damon [name] We talked about purpose of palliative care visit. Damon [name] was in agreement. We talked about how it is feeling today. Damon [name] that he's doing well. We talked about Thanksgiving for which he did have a good day. We talked about challenges with covid endemic and isolation at the facility with no visitors. Damon [name] is able to speak with his wife by phone or through the window. We talked about symptoms of pain and shortness of breath what she denies. We talked about his appetite. We talked about smoking cessation for which he declines. We talked about medical goals of care including aggressive versus conservative versus comfort care. We talked about code status as he wishes to further discuss with his wife. Damon [name] endorses he would like to be a DNR, but he wants to further discuss with Ms. [name] Offered to call and update Damon [name] on palliative care visit and further discussion on and status. Damon [name] endorses his wishes are to talk to her himself. Offered to have a discussion with both at the same time, Damon [name] did go ahead and attempt to contact her but she did not answer a message left. Discuss with Damon [name] will follow up in one week to see if he had any questions or further decisions. Damon [name] in agreement. We talked about role of palliative care and plan of care. Therapeutic listening and emotional support provided. I updated nursing staff and any changes to current goals or plan of care.  Palliative Care was asked to help to continue to address goals of care.   CODE STATUS: Full  PPS: 50% HOSPICE ELIGIBILITY/DIAGNOSIS: TBD  PAST MEDICAL HISTORY:  Past Medical History:  Diagnosis Date   CAD (coronary artery disease)    a. 01/2016 MI/PCI: LM nl, LAD 123m/d (3.25 x 28 Xience DES), 40d, LCX 30ost, OM1 95 (staged - 2.75 x 18 Xience Alpine DES), OM2/3 min irregs, RCA 51m.   Chronic combined systolic and  diastolic CHF (congestive heart failure) (HCC)    a. 01/2016 Echo: Ef 25-30%, Gr1 DD, mild AI, mildly dil LA;  b. 02/2016 Echo: EF 30-35%, apical AK, antsept and ant HK, nl RV fxn.   CKD (chronic kidney disease), stage III    COPD (chronic obstructive pulmonary disease) (HCC)    Diabetes mellitus without complication (HCC)    Diverticulitis    Essential hypertension    Ischemic cardiomyopathy    a. 01/2016 Echo: Ef 25-30%;  b. 02/2016 Echo: EF 30-35%.   LBBB (left bundle branch block)    PAD (peripheral artery disease) (HCC)    a. 10/2015 s/p PTA and stenting of the RCIA, LCIA, and PTA of the LEIA (Schnier); b. 04/2016 PTA or R tibioperoneal trunk & prox PT, PTA/DBA of R Pop and SFA, Viabahn covered stent x 2 to the R SFA and pop (71mm x 25 cm & 69mm x 15cm).   Tobacco abuse     SOCIAL HX:  Social History   Tobacco Use   Smoking status: Current Every Day Smoker    Packs/day: 0.50    Years: 30.00    Pack years: 15.00    Types: Cigarettes   Smokeless tobacco: Current User  Substance Use Topics   Alcohol use: No    ALLERGIES: No Known Allergies   PERTINENT MEDICATIONS:  Outpatient Encounter Medications as of 03/13/2019  Medication Sig   Amino Acids-Protein Hydrolys (FEEDING SUPPLEMENT, PRO-STAT SUGAR FREE 64,) LIQD Take 30 mLs by mouth 3 (three) times daily.    amLODipine (NORVASC) 10 MG tablet Take 10 mg by mouth daily.   atorvastatin (LIPITOR) 80 MG tablet Take 1 tablet (80 mg total) by mouth daily at 6 PM.   carvedilol (COREG) 12.5 MG tablet Take 1 tablet (12.5 mg total) by mouth 2 (two) times daily with a meal.   clopidogrel (PLAVIX) 75 MG tablet Take 1 tablet (75 mg total) by mouth daily.   ferrous sulfate 325 (65 FE) MG tablet Take 1 tablet (325 mg total) by mouth 3 (three) times daily with meals.   fluticasone (FLONASE) 50 MCG/ACT nasal spray Place 2 sprays into both nostrils daily.   furosemide (LASIX) 40 MG tablet Take 1 tablet (40 mg total) by mouth daily.    hydrALAZINE (APRESOLINE) 25 MG tablet Take 1 tablet (25 mg total) by mouth every 8 (eight) hours.   HYDROcodone-acetaminophen (NORCO/VICODIN) 5-325 MG tablet Take 1 tablet by mouth every 6 (six) hours as needed for moderate pain.   insulin aspart (NOVOLOG) 100 UNIT/ML injection Inject 6 Units into the skin 2 (two) times daily with breakfast and lunch. If reading is between 70-100 give after eating. Hold if reading is less than 70 or if resident will not eat   Insulin Glargine (LANTUS SOLOSTAR) 100 UNIT/ML Solostar Pen Inject 15 Units into the skin.    Ipratropium-Albuterol (COMBIVENT RESPIMAT) 20-100 MCG/ACT AERS respimat Inhale 1 puff into the lungs every 4 (four) hours as needed for wheezing.   Melatonin 5 MG TABS Take 5 mg by mouth at bedtime.    Multiple Vitamin (MULTIVITAMIN WITH MINERALS) TABS tablet Take 1 tablet by mouth  daily.   omeprazole (PRILOSEC) 20 MG capsule Take 20 mg by mouth daily.   pantoprazole (PROTONIX) 20 MG tablet    polyethylene glycol (MIRALAX / GLYCOLAX) packet Take 17 g by mouth daily.   senna (SENOKOT) 8.6 MG tablet Take 1 tablet by mouth daily.   tamsulosin (FLOMAX) 0.4 MG CAPS capsule Take 1 capsule (0.4 mg total) by mouth daily.   No facility-administered encounter medications on file as of 03/13/2019.     PHYSICAL EXAM:   General: NAD, pleasant male Cardiovascular: regular rate and rhythm Pulmonary: clear ant fields Abdomen: soft, nontender, + bowel sounds Extremities: no edema, no joint deformities; right BKA Neurological: w/c dependent  TRUE Shackleford Prince RomeZ Kailoni Vahle, NP

## 2019-05-06 ENCOUNTER — Encounter: Payer: Self-pay | Admitting: Nurse Practitioner

## 2019-05-06 ENCOUNTER — Other Ambulatory Visit: Payer: Self-pay

## 2019-05-06 ENCOUNTER — Non-Acute Institutional Stay: Payer: Medicare Other | Admitting: Nurse Practitioner

## 2019-05-06 VITALS — BP 132/78 | HR 76 | Temp 98.2°F | Resp 18 | Wt 159.0 lb

## 2019-05-06 DIAGNOSIS — I509 Heart failure, unspecified: Secondary | ICD-10-CM

## 2019-05-06 DIAGNOSIS — Z515 Encounter for palliative care: Secondary | ICD-10-CM

## 2019-05-06 NOTE — Progress Notes (Signed)
[facility] Consult Note Telephone: [phone]  Fax: [phone]  PATIENT NAME: Damon [name] DOB: 08-Aug-1950 MRN: [id number]  PRIMARY CARE PROVIDER:Dr Hodges/[facility] REFERRING PROVIDER:Dr [facility] RESPONSIBLE PARTY:Self Damon [name] spouse [phone]  RECOMMENDATIONS and PLAN: 1.ACP: currently a full code with wishes to be a DNR after discussion with Damon [name], his wife. Wishes are to treat what is treatable.  2.Palliative care encounter Palliative medicine team will continue to support patient, patient's family, and medical team. Visit consisted of counseling and education dealing with the complex and emotionally intense issues of symptom management and palliative care in the setting of serious and potentially life-threatening illness  I spent 35 minutes providing this consultation,  from 2:30pm to 3:05pm. More than 50% of the time in this consultation was spent coordinating communication.   HISTORY OF PRESENT ILLNESS:  Damon [name] is a 69 y.o. year old male with multiple medical problems including Ischemic cardiomyopathy, Chronic diastolic congestive heart failure, coronary artery disease, diabetes, chronic kidney disease, COPD, peripheral vascular disease, osteoarthritis, BPH, hyperlipidemia, anemia of chronic disease, Right BKA, tobacco abuse. Damon [name] continues to resides [place]. Damon [name] does transfer himself to the wheelchair, performance adl's and toilets himself. Damon [name] does also feed himself and appetite has been fair. Damon [name] actually has had a recent weight gain of 4 pounds. BMI 24.9. Best primary provider note 12 / 14 / 2020 for diabetes with blood sugars ranging from 72 to 3:13 with A1C less than 9% at goal. Medical goals focus on full code with aggressive interventions. Damon. Laban has received the flu and pneumonia vaccine. Staff endorses no new changes. No  recent Falls, wounds, hospitalizations, infections. Damon [name] is able to be ambulatory when he uses his prosthetic. At present Damon [name] is sitting on the side of the bed in his room. Damon [name] appears comfortable, new visitors present. I visited and observe Damon [name] Damon [name] is oriented. We talked about purpose of palliative care visit and Damon [name] in agreement. We talked about how he was feeling today. Damon [name] endorses that he is doing well. We talked about symptoms of pain and shortness of breath which Damon [name] denies. Damon [name] endorses that he does continue to have a chronic cough, he goes outside to smoke almost daily. Damon [name] denies smoking cessation. We talked about his appetite. We talked about his daily routine at the [facility]. We talked about challenges with covid-19 pandemic and social isolation. We talked about challenges with lots of Independence and residing at skilled facility. We talked about medical goals of care including code status. Damon. [name] endorses has as not but will talk to his wife about code status. Damon [name] prefers that he talks with her her to a common discussion between him and Damon [name] [name] endorses he discuss with Damon [name] this week. We talked about role of palliative care and plan of care. No need changes to current goals or plan of care. Discuss with Damon. [name] will follow up and 1 week about discussion of code status. Damon [name] in  agreement. I updated nursing staff no new changes to current goals or plan of care.Palliative Care was asked to help to continue to address goals of care.   CODE STATUS: Full code PPS: 50%  HOSPICE ELIGIBILITY/DIAGNOSIS: TBD  PAST MEDICAL HISTORY:  Past Medical History:  Diagnosis Date  . CAD (coronary artery disease)  a. 01/2016 MI/PCI: LM nl, LAD 140m/d (3.25 x 28 Xience DES), 40d, LCX 30ost, OM1 95 (staged - 2.75 x 18 Xience Alpine DES), OM2/3 min irregs, RCA 11m.  . Chronic combined systolic and diastolic CHF  (congestive heart failure) (HCC)    a. 01/2016 Echo: Ef 25-30%, Gr1 DD, mild AI, mildly dil LA;  b. 02/2016 Echo: EF 30-35%, apical AK, antsept and ant HK, nl RV fxn.  . CKD (chronic kidney disease), stage III   . COPD (chronic obstructive pulmonary disease) (HCC)   . Diabetes mellitus without complication (HCC)   . Diverticulitis   . Essential hypertension   . Ischemic cardiomyopathy    a. 01/2016 Echo: Ef 25-30%;  b. 02/2016 Echo: EF 30-35%.  Marland Kitchen LBBB (left bundle branch block)   . PAD (peripheral artery disease) (HCC)    a. 10/2015 s/p PTA and stenting of the RCIA, LCIA, and PTA of the LEIA (Schnier); b. 04/2016 PTA or R tibioperoneal trunk & prox PT, PTA/DBA of R Pop and SFA, Viabahn covered stent x 2 to the R SFA and pop (31mm x 25 cm & 84mm x 15cm).  . Tobacco abuse     SOCIAL HX:  Social History   Tobacco Use  . Smoking status: Current Every Day Smoker    Packs/day: 0.50    Years: 30.00    Pack years: 15.00    Types: Cigarettes  . Smokeless tobacco: Current User  Substance Use Topics  . Alcohol use: No    ALLERGIES: No Known Allergies   PERTINENT MEDICATIONS:  Outpatient Encounter Medications as of 05/06/2019  Medication Sig  . amLODipine (NORVASC) 10 MG tablet Take 10 mg by mouth daily.  Marland Kitchen atorvastatin (LIPITOR) 80 MG tablet Take 1 tablet (80 mg total) by mouth daily at 6 PM.  . carvedilol (COREG) 12.5 MG tablet Take 1 tablet (12.5 mg total) by mouth 2 (two) times daily with a meal.  . clopidogrel (PLAVIX) 75 MG tablet Take 1 tablet (75 mg total) by mouth daily.  . ferrous sulfate 325 (65 FE) MG tablet Take 1 tablet (325 mg total) by mouth 3 (three) times daily with meals.  . furosemide (LASIX) 40 MG tablet Take 1 tablet (40 mg total) by mouth daily.  . hydrALAZINE (APRESOLINE) 25 MG tablet Take 1 tablet (25 mg total) by mouth every 8 (eight) hours.  . insulin aspart (NOVOLOG) 100 UNIT/ML injection Inject 6 Units into the skin 2 (two) times daily with breakfast and lunch. If  reading is between 70-100 give after eating. Hold if reading is less than 70 or if resident will not eat  . Insulin Glargine (LANTUS SOLOSTAR) 100 UNIT/ML Solostar Pen Inject 18 Units into the skin at bedtime.   . Melatonin 5 MG TABS Take 5 mg by mouth at bedtime.   . Multiple Vitamin (MULTIVITAMIN WITH MINERALS) TABS tablet Take 1 tablet by mouth daily.  Marland Kitchen omeprazole (PRILOSEC) 20 MG capsule Take 20 mg by mouth daily.  . polyethylene glycol (MIRALAX / GLYCOLAX) packet Take 17 g by mouth daily.  . Probiotic Product (ALIGN) 4 MG CAPS Take 1 capsule by mouth daily.  Marland Kitchen senna (SENOKOT) 8.6 MG tablet Take 1 tablet by mouth daily.  . tamsulosin (FLOMAX) 0.4 MG CAPS capsule Take 1 capsule (0.4 mg total) by mouth daily.  . Amino Acids-Protein Hydrolys (FEEDING SUPPLEMENT, PRO-STAT SUGAR FREE 64,) LIQD Take 30 mLs by mouth 3 (three) times daily.   . fluticasone (FLONASE) 50 MCG/ACT nasal spray Place 2  sprays into both nostrils daily.  Marland Kitchen HYDROcodone-acetaminophen (NORCO/VICODIN) 5-325 MG tablet Take 1 tablet by mouth every 6 (six) hours as needed for moderate pain. (Patient not taking: Reported on 05/06/2019)  . Ipratropium-Albuterol (COMBIVENT RESPIMAT) 20-100 MCG/ACT AERS respimat Inhale 1 puff into the lungs every 4 (four) hours as needed for wheezing.  . [DISCONTINUED] pantoprazole (PROTONIX) 20 MG tablet    No facility-administered encounter medications on file as of 05/06/2019.    PHYSICAL EXAM:   General: NAD,pleasant male Cardiovascular: regular rate and rhythm Pulmonary: clear ant fields Abdomen: soft, nontender, + bowel sounds Extremities: no edema, no joint deformities/RIGHT BKA Neurological: ambulatory with prothestic  Debany Vantol Prince Rome, NP

## 2019-06-05 ENCOUNTER — Non-Acute Institutional Stay: Payer: Medicare Other | Admitting: Nurse Practitioner

## 2019-06-05 ENCOUNTER — Other Ambulatory Visit: Payer: Self-pay

## 2019-06-05 ENCOUNTER — Encounter: Payer: Self-pay | Admitting: Nurse Practitioner

## 2019-06-05 VITALS — BP 123/68 | HR 80 | Temp 97.9°F | Resp 18

## 2019-06-05 DIAGNOSIS — Z515 Encounter for palliative care: Secondary | ICD-10-CM

## 2019-06-05 DIAGNOSIS — I509 Heart failure, unspecified: Secondary | ICD-10-CM

## 2019-06-05 NOTE — Progress Notes (Signed)
Therapist, nutritional Palliative Care Consult Note Telephone: [phone]  Fax: [phone]  PATIENT NAME: Damon [name] DOB: Jan 11, 1951 MRN: [id number]   PRIMARY CARE PROVIDER:Dr Hodges/[facility] REFERRING PROVIDER:Dr [facility] RESPONSIBLE PARTY:Self Darrell Jewel spouse [phone]  RECOMMENDATIONS and PLAN: 1.ACP: changed to DNR, placed in Vynca/Epic; wishes are to continue to go to hospital if needed, treat what is treatable, yes to antiobiotic, IVF, diagnostic testing, lab testing  2.Palliative care encounter Palliative medicine team will continue to support patient, patient's family, and medical team. Visit consisted of counseling and education dealing with the complex and emotionally intense issues of symptom management and palliative care in the setting of serious and potentially life-threatening illness  I spent 65 minutes providing this consultation,  from 9:55am to 11:00am. More than 50% of the time in this consultation was spent coordinating communication.   HISTORY OF PRESENT ILLNESS:  Damon [name] is a 69 y.o. year old male with multiple medical problems including Ischemic cardiomyopathy, Chronic diastolic congestive heart failure, coronary artery disease, diabetes, chronic kidney disease, COPD, peripheral vascular disease, osteoarthritis, BPH, hyperlipidemia, anemia of chronic disease, Right BKA, tobacco abuse. [name] continues to reside at [facility]. [name] does transfer himself to the wheelchair, is mobile wherever he wishes to go. [name] does perform ADL,requires some assistance. [name] does toilet himself. [name] feeds himself in a fair appetite with a slight weight gain. Mr Sievers is able to verbalize his needs. No recent falls, wounds, infections, hospitalizations. Last primary provider note 12 / 14 / 2020 for diabetes with A1C at go less than 9%.Staff  endorses that must release daily routine is going to the smoking patio, eating and sleeping. Palliative care follow-up visit today to revisit medical goals of care and code status. Mr Casasola wish to further discuss code status with his wife though he wishes to be a DNR but wanted to talk with her first. At present [name] is lying in bed. [name] appears comfortable, no distress. No visitors present. I visited and observed [name]. We talked about purpose of palliative care visit in [name] in agreement. We talked about how Mr Mendonsa has been feeling. [name] endorses that he's doing well. We talked about his daily routine. We talked about smoking and he politely declined smoking cessation. [name] denies pain or shortness of breath. We talked about his prosthetic. We talked about residing in skilled facility with social limitations. [name] talked about being able to see his wife through the window and talk with her on the phone. Discuss medical goals of care. [name] endorse is he has not gotten a chance to talk to his wife about code status. We revisited code status full code vs. DNR scenarios. [name] endorses he wishes to be a DNR. Asked if it was okay to contact his wife for update. [name] in agreement. We talked about role of palliative care and plan of care. Discuss will follow up in two months if needed or sooner should he declined. Therapeutic listening and emotional support provided. Questions answered to satisfaction.  I called [name], talked about purpose of palliative care visit. We talked about palliative care visit with [name]. We talked about chronic disease progression. We talked about past medical history and series of events that led Mr. Prom to long-term care. We talked about  his current functional status. Mrs. Cerro endorses  she wishes she could bring him home but when she attempted [name] did not transfer himself. Mr Minner was not motivated in caring for himself  and it was difficult for her to care for him physically.  We talked about medical goals of care. We talked about [name] wishes to be a DNR. [name] in agreement . We talked about role of palliative care and plan of care. Discussed will follow up in two months if needed or sooner should he declined. [name] in agreement. DNR form complete did placed an Epic/vynca. I have updated nursing staff.  Palliative Care was asked to help address goals of care.   CODE STATUS: DNR  PPS: 50% HOSPICE ELIGIBILITY/DIAGNOSIS: TBD  PAST MEDICAL HISTORY:  Past Medical History:  Diagnosis Date  . CAD (coronary artery disease)    a. 01/2016 MI/PCI: LM nl, LAD 170m/d (3.25 x 28 Xience DES), 40d, LCX 30ost, OM1 95 (staged - 2.75 x 18 Xience Alpine DES), OM2/3 min irregs, RCA 76m.  . Chronic combined systolic and diastolic CHF (congestive heart failure) (HCC)    a. 01/2016 Echo: Ef 25-30%, Gr1 DD, mild AI, mildly dil LA;  b. 02/2016 Echo: EF 30-35%, apical AK, antsept and ant HK, nl RV fxn.  . CKD (chronic kidney disease), stage III   . COPD (chronic obstructive pulmonary disease) (HCC)   . Diabetes mellitus without complication (HCC)   . Diverticulitis   . Essential hypertension   . Ischemic cardiomyopathy    a. 01/2016 Echo: Ef 25-30%;  b. 02/2016 Echo: EF 30-35%.  Marland Kitchen LBBB (left bundle branch block)   . PAD (peripheral artery disease) (HCC)    a. 10/2015 s/p PTA and stenting of the RCIA, LCIA, and PTA of the LEIA (Schnier); b. 04/2016 PTA or R tibioperoneal trunk & prox PT, PTA/DBA of R Pop and SFA, Viabahn covered stent x 2 to the R SFA and pop (21mm x 25 cm & 44mm x 15cm).  . Tobacco abuse     SOCIAL HX:  Social History   Tobacco Use  . Smoking status: Current Every Day Smoker    Packs/day: 0.50    Years: 30.00    Pack years: 15.00    Types: Cigarettes  . Smokeless tobacco: Current User  Substance Use Topics  . Alcohol use: No    ALLERGIES: No Known Allergies   PERTINENT MEDICATIONS:    Outpatient Encounter Medications as of 06/05/2019  Medication Sig  . Amino Acids-Protein Hydrolys (FEEDING SUPPLEMENT, PRO-STAT SUGAR FREE 64,) LIQD Take 30 mLs by mouth 3 (three) times daily.   Marland Kitchen amLODipine (NORVASC) 10 MG tablet Take 10 mg by mouth daily.  Marland Kitchen atorvastatin (LIPITOR) 80 MG tablet Take 1 tablet (80 mg total) by mouth daily at 6 PM.  . carvedilol (COREG) 12.5 MG tablet Take 1 tablet (12.5 mg total) by mouth 2 (two) times daily with a meal.  . clopidogrel (PLAVIX) 75 MG tablet Take 1 tablet (75 mg total) by mouth daily.  . ferrous sulfate 325 (65 FE) MG tablet Take 1 tablet (325 mg total) by mouth 3 (three) times daily with meals.  . fluticasone (FLONASE) 50 MCG/ACT nasal spray Place 2 sprays into both nostrils daily.  . furosemide (LASIX) 40 MG tablet Take 1 tablet (40 mg total) by mouth daily.  . hydrALAZINE (APRESOLINE) 25 MG tablet Take 1 tablet (25 mg total) by mouth every 8 (eight) hours.  Marland Kitchen HYDROcodone-acetaminophen (NORCO/VICODIN) 5-325 MG tablet Take 1 tablet by mouth every  6 (six) hours as needed for moderate pain. (Patient not taking: Reported on 05/06/2019)  . insulin aspart (NOVOLOG) 100 UNIT/ML injection Inject 6 Units into the skin 2 (two) times daily with breakfast and lunch. If reading is between 70-100 give after eating. Hold if reading is less than 70 or if resident will not eat  . Insulin Glargine (LANTUS SOLOSTAR) 100 UNIT/ML Solostar Pen Inject 18 Units into the skin at bedtime.   . Ipratropium-Albuterol (COMBIVENT RESPIMAT) 20-100 MCG/ACT AERS respimat Inhale 1 puff into the lungs every 4 (four) hours as needed for wheezing.  . Melatonin 5 MG TABS Take 5 mg by mouth at bedtime.   . Multiple Vitamin (MULTIVITAMIN WITH MINERALS) TABS tablet Take 1 tablet by mouth daily.  Marland Kitchen omeprazole (PRILOSEC) 20 MG capsule Take 20 mg by mouth daily.  . polyethylene glycol (MIRALAX / GLYCOLAX) packet Take 17 g by mouth daily.  . Probiotic Product (ALIGN) 4 MG CAPS Take 1 capsule  by mouth daily.  Marland Kitchen senna (SENOKOT) 8.6 MG tablet Take 1 tablet by mouth daily.  . tamsulosin (FLOMAX) 0.4 MG CAPS capsule Take 1 capsule (0.4 mg total) by mouth daily.   No facility-administered encounter medications on file as of 06/05/2019.    PHYSICAL EXAM:   General: NAD, pleasant male Cardiovascular: regular rate and rhythm Pulmonary: clear ant fields Abdomen: soft, nontender, + bowel sounds Extremities: no edema, no joint deformities; BKA Neurological: w/c dependent  Teea Ducey Ihor Gully, NP

## 2019-09-10 ENCOUNTER — Other Ambulatory Visit: Payer: Self-pay

## 2019-09-10 ENCOUNTER — Non-Acute Institutional Stay: Payer: Medicare Other | Admitting: Nurse Practitioner

## 2019-09-10 VITALS — BP 124/65 | HR 68 | Temp 97.7°F | Resp 18 | Wt 159.6 lb

## 2019-09-10 DIAGNOSIS — Z515 Encounter for palliative care: Secondary | ICD-10-CM

## 2019-09-10 DIAGNOSIS — I509 Heart failure, unspecified: Secondary | ICD-10-CM

## 2019-09-10 NOTE — Progress Notes (Signed)
Therapist, nutritional Palliative Care Consult Note Telephone: [phone]  Fax: [phone]  PATIENT NAME: Damon [name] DOB: 03/01/1951 MRN: [id number]  PRIMARY CARE PROVIDER:Dr Hodges/[facility] REFERRING PROVIDER:Dr [facility] RESPONSIBLE PARTY:Self Darrell Jewel spouse [phone]  RECOMMENDATIONS and PLAN: 1.ACP: changed to DNR, placed in Vynca/Epic; wishes are to continue to go to hospital if needed, treat what is treatable, yes to antiobiotic, IVF, diagnostic testing, lab testing  2.Palliative care encounter Palliative medicine team will continue to support patient, patient's family, and medical team. Visit consisted of counseling and education dealing with the complex and emotionally intense issues of symptom management and palliative care in the setting of serious and potentially life-threatening illness  I spent 60 minutes providing this consultation,  Start at 11:00am. More than 50% of the time in this consultation was spent coordinating communication.   HISTORY OF PRESENT ILLNESS:  Damon [name] is a 69 y.o. year old male with multiple medical problems including Ischemic cardiomyopathy, Chronic diastolic congestive heart failure, coronary artery disease, diabetes, chronic kidney disease, COPD, peripheral vascular disease, osteoarthritis, BPH, hyperlipidemia, anemia of chronic disease, Right BKA, tobacco abuse. Mr. Dieudonne continues to reside in [facility]. Mr. Steiner does require assistance for transferring to the wheelchair but his mobile throughout the facility. Mr Choi does perform some adl's with assistance, toileting. Mr Flessner does feed himself and appetite has been good. No recent falls, wounds, infections, hospitalizations. Care plan meeting held on 6 / 1 / 2021 no changes to goals of care. Last primary provider note [facility] nurse practitioner 5/18 / 2021 for GERD with no  changes to current plan of care. Medical goals focus on DNR, do not intubate, no feeding tube but wishes are for antibiotics, blood transfusion, diagnostic testing, hospitalization, IV hydration, lab testing. Mr. Calo continues to go out to the smoking patio daily. Mr. Bair does use his prosthetic on his right leg but not all the time her staff. Staff endorses no new changes or concerns. At present Mr. Bensinger is sitting in the wheelchair in his room without his prosthetic. We talked about purpose of palliative care visit. Mr. Oats in agreement. We talked about how he was feeling today. Mr. Quackenbush endorsed is that he is doing well he was just getting ready to go out to the smoking patio. We talked about smoking cessation, Mr. Jaros politely declined. We talked about symptoms of pain and shortness of breath which he is not experiencing. We talked about his appetite which is good. We talked about the importance of wearing his prosthetic, exercising as now the facility is more opened. We talked about exercises. We talked about Mrs. Ospina that she does come to the window to visit often. We talked about challenges with residing at skilled facility. We talked about social isolation with kovid. We talked about coping strategies. We talked about medical goals of care as he remains a DNR, reviewed. We talked about role of palliative care and plan of care. I present time Mr. Hallenbeck is stable. Mr. Pollard requested he will update Mrs. Seawood about palliative care visit. I updated nursing staff new new changes to current goals or plan of care. Palliative Care was asked to help to continue to address goals of care.   CODE STATUS: DNR  PPS: 50% HOSPICE ELIGIBILITY/DIAGNOSIS: TBD  PAST MEDICAL HISTORY:  Past Medical History:  Diagnosis Date  . CAD (coronary artery disease)    a. 01/2016  MI/PCI: LM nl, LAD 148m/d (3.25 x 28 Xience DES), 40d, LCX 30ost, OM1 95 (staged - 2.75 x 18 Xience Alpine DES), OM2/3 min irregs, RCA  18m.  . Chronic combined systolic and diastolic CHF (congestive heart failure) (HCC)    a. 01/2016 Echo: Ef 25-30%, Gr1 DD, mild AI, mildly dil LA;  b. 02/2016 Echo: EF 30-35%, apical AK, antsept and ant HK, nl RV fxn.  . CKD (chronic kidney disease), stage III   . COPD (chronic obstructive pulmonary disease) (HCC)   . Diabetes mellitus without complication (HCC)   . Diverticulitis   . Essential hypertension   . Ischemic cardiomyopathy    a. 01/2016 Echo: Ef 25-30%;  b. 02/2016 Echo: EF 30-35%.  Marland Kitchen LBBB (left bundle branch block)   . PAD (peripheral artery disease) (HCC)    a. 10/2015 s/p PTA and stenting of the RCIA, LCIA, and PTA of the LEIA (Schnier); b. 04/2016 PTA or R tibioperoneal trunk & prox PT, PTA/DBA of R Pop and SFA, Viabahn covered stent x 2 to the R SFA and pop (23mm x 25 cm & 60mm x 15cm).  . Tobacco abuse     SOCIAL HX:  Social History   Tobacco Use  . Smoking status: Current Every Day Smoker    Packs/day: 0.50    Years: 30.00    Pack years: 15.00    Types: Cigarettes  . Smokeless tobacco: Current User  Substance Use Topics  . Alcohol use: No    ALLERGIES: No Known Allergies   PERTINENT MEDICATIONS:  Outpatient Encounter Medications as of 09/10/2019  Medication Sig  . Amino Acids-Protein Hydrolys (FEEDING SUPPLEMENT, PRO-STAT SUGAR FREE 64,) LIQD Take 30 mLs by mouth 3 (three) times daily.   Marland Kitchen amLODipine (NORVASC) 10 MG tablet Take 10 mg by mouth daily.  Marland Kitchen atorvastatin (LIPITOR) 80 MG tablet Take 1 tablet (80 mg total) by mouth daily at 6 PM.  . carvedilol (COREG) 12.5 MG tablet Take 1 tablet (12.5 mg total) by mouth 2 (two) times daily with a meal.  . clopidogrel (PLAVIX) 75 MG tablet Take 1 tablet (75 mg total) by mouth daily.  . ferrous sulfate 325 (65 FE) MG tablet Take 1 tablet (325 mg total) by mouth 3 (three) times daily with meals.  . fluticasone (FLONASE) 50 MCG/ACT nasal spray Place 2 sprays into both nostrils daily.  . furosemide (LASIX) 40 MG tablet Take  1 tablet (40 mg total) by mouth daily.  . hydrALAZINE (APRESOLINE) 25 MG tablet Take 1 tablet (25 mg total) by mouth every 8 (eight) hours.  Marland Kitchen HYDROcodone-acetaminophen (NORCO/VICODIN) 5-325 MG tablet Take 1 tablet by mouth every 6 (six) hours as needed for moderate pain. (Patient not taking: Reported on 05/06/2019)  . insulin aspart (NOVOLOG) 100 UNIT/ML injection Inject 6 Units into the skin 2 (two) times daily with breakfast and lunch. If reading is between 70-100 give after eating. Hold if reading is less than 70 or if resident will not eat  . Insulin Glargine (LANTUS SOLOSTAR) 100 UNIT/ML Solostar Pen Inject 18 Units into the skin at bedtime.   . Ipratropium-Albuterol (COMBIVENT RESPIMAT) 20-100 MCG/ACT AERS respimat Inhale 1 puff into the lungs every 4 (four) hours as needed for wheezing.  . Melatonin 5 MG TABS Take 5 mg by mouth at bedtime.   . Multiple Vitamin (MULTIVITAMIN WITH MINERALS) TABS tablet Take 1 tablet by mouth daily.  Marland Kitchen omeprazole (PRILOSEC) 20 MG capsule Take 20 mg by mouth daily.  . polyethylene glycol (MIRALAX /  GLYCOLAX) packet Take 17 g by mouth daily.  . Probiotic Product (ALIGN) 4 MG CAPS Take 1 capsule by mouth daily.  Marland Kitchen senna (SENOKOT) 8.6 MG tablet Take 1 tablet by mouth daily.  . tamsulosin (FLOMAX) 0.4 MG CAPS capsule Take 1 capsule (0.4 mg total) by mouth daily.   No facility-administered encounter medications on file as of 09/10/2019.    PHYSICAL EXAM:   General: NAD, w/c dependent, pleasant male Cardiovascular: regular rate and rhythm Pulmonary: clear ant fields Neurological: w/c dependent  Lonnell Chaput Ihor Gully, NP

## 2019-11-13 ENCOUNTER — Encounter: Payer: Self-pay | Admitting: Nurse Practitioner

## 2019-11-13 ENCOUNTER — Non-Acute Institutional Stay: Payer: Medicare Other | Admitting: Nurse Practitioner

## 2019-11-13 VITALS — BP 116/73 | Wt 159.6 lb

## 2019-11-13 DIAGNOSIS — I509 Heart failure, unspecified: Secondary | ICD-10-CM

## 2019-11-13 DIAGNOSIS — Z515 Encounter for palliative care: Secondary | ICD-10-CM

## 2019-11-13 NOTE — Progress Notes (Signed)
Therapist, nutritional Palliative Care Consult Note Telephone: [phone]  Fax: [phone]  PATIENT NAME: Damon [name] DOB: 1950-07-03 MRN: [id number] PRIMARY CARE PROVIDER:Dr Hodges/[facility] REFERRING PROVIDER:Dr [facility] RESPONSIBLE PARTY:Self Darrell Jewel spouse [phone]  RECOMMENDATIONS and PLAN: 1.ACP:DNR,  in Vynca/Epic; wishes are to continue to go to hospital if needed, treat what is treatable, yes to antiobiotic, IVF, diagnostic testing, lab testing  2.Palliative care encounter Palliative medicine team will continue to support patient, patient's family, and medical team. Visit consisted of counseling and education dealing with the complex and emotionally intense issues of symptom management and palliative care in the setting of serious and potentially life-threatening illness  3. F/u 3 months for ongoing monitoring chronic disease progression, decrease hospitalizations, monitoring symptoms appetite  I spent 60 minutes providing this consultation, start at 12:20pm. More than 50% of the time in this consultation was spent coordinating communication.   HISTORY OF PRESENT ILLNESS:  Damon [name] is a 69 y.o. year old male with multiple medical problems including Ischemic cardiomyopathy, Chronic diastolic congestive heart failure, coronary artery disease, diabetes, chronic kidney disease, COPD, peripheral vascular disease, osteoarthritis, BPH, hyperlipidemia, anemia of chronic disease, Right BKA, tobacco abuse.Damon [name] continues to reside at [facility] at [facility]. Damon. [name] does transfer to the wheelchair and as mobile as he wishes. Damon [name] does continue to go to the smoking patio frequently multiple times a week at times daily. Damon [name] requires assistance with ADLs. Damon [name] does feed himself and appetite has been fair with weight 159.6 lb, 4 lb weight gain with BMI 25 currently on a  diabetic diet with sauce and bite-size texture regular liquid consistency. Damon [name] had a recent dental procedure 7/28 / 2021 for which he was on the liquid diet but his transition back to his regular meal. Last Primary Provider visit [facility] Nurse Practitioner 7/8 / 2021 for comprehensive review. Medical goals focus on DNR, do not intubate with no feeding tube but wishes are for antibiotic of my IV fluids, diagnostic testing, hospitalization, blood transfusion, hydration, lab testing. No recent falls, wounds, hospitalizations common infections. Staff endorses no new changes are concerns. At present Damon [name] is sitting in the wheelchair in his room watching TV. Damon [name] appears comfortable, no visitors present. I visited and observed Damon [name] We talked about purpose of palliative care visit. We talked about how Damon Musich has been feeling today. Damon. [name] endorses he is doing well. We talked about his mobility and prosthetic. We talked about going to the physical therapy gym for maintenance. Damon [name] endorses he would like to do that but has not pursued that interest. We talked about the importance of mobility. We talked about the time that he spends on the smoking patio. We talked about smoking cessation. Damon [name] politely declined. We talked about symptoms of pain and shortness of breath which he does not exhibit. We talked about his appetite which is good. We talked about the facility opening up more visiting as he is getting to spend more time with his wife. Damon [name] endorses that has made a big difference. Medical goals reviewed. We talked about role of palliative care and plan of care. Discuss at present time that you're late does remain stable and will follow up in 3 months if needed or sooner should he declined. Damon. Pfluger in agreement. Attempted to contact Damon [name], unable to leave a message will continue to attempt to  contact. I updated nursing staff, no new changes at present time, continue current  goals of care.  Palliative Care was asked to help to continue to address goals of care.   CODE STATUS: DNR  PPS: 50% HOSPICE ELIGIBILITY/DIAGNOSIS: TBD  PAST MEDICAL HISTORY:  Past Medical History:  Diagnosis Date  . CAD (coronary artery disease)    a. 01/2016 MI/PCI: LM nl, LAD 132m/d (3.25 x 28 Xience DES), 40d, LCX 30ost, OM1 95 (staged - 2.75 x 18 Xience Alpine DES), OM2/3 min irregs, RCA 62m.  . Chronic combined systolic and diastolic CHF (congestive heart failure) (HCC)    a. 01/2016 Echo: Ef 25-30%, Gr1 DD, mild AI, mildly dil LA;  b. 02/2016 Echo: EF 30-35%, apical AK, antsept and ant HK, nl RV fxn.  . CKD (chronic kidney disease), stage III   . COPD (chronic obstructive pulmonary disease) (HCC)   . Diabetes mellitus without complication (HCC)   . Diverticulitis   . Essential hypertension   . Ischemic cardiomyopathy    a. 01/2016 Echo: Ef 25-30%;  b. 02/2016 Echo: EF 30-35%.  Marland Kitchen LBBB (left bundle branch block)   . PAD (peripheral artery disease) (HCC)    a. 10/2015 s/p PTA and stenting of the RCIA, LCIA, and PTA of the LEIA (Schnier); b. 04/2016 PTA or R tibioperoneal trunk & prox PT, PTA/DBA of R Pop and SFA, Viabahn covered stent x 2 to the R SFA and pop (79mm x 25 cm & 11mm x 15cm).  . Tobacco abuse     SOCIAL HX:  Social History   Tobacco Use  . Smoking status: Current Every Day Smoker    Packs/day: 0.50    Years: 30.00    Pack years: 15.00    Types: Cigarettes  . Smokeless tobacco: Current User  Substance Use Topics  . Alcohol use: No    ALLERGIES: No Known Allergies   PERTINENT MEDICATIONS:  Outpatient Encounter Medications as of 11/13/2019  Medication Sig  . Amino Acids-Protein Hydrolys (FEEDING SUPPLEMENT, PRO-STAT SUGAR FREE 64,) LIQD Take 30 mLs by mouth 3 (three) times daily.   Marland Kitchen amLODipine (NORVASC) 10 MG tablet Take 10 mg by mouth daily.  Marland Kitchen atorvastatin (LIPITOR) 80 MG tablet Take 1 tablet (80 mg total) by mouth daily at 6 PM.  . carvedilol (COREG) 12.5  MG tablet Take 1 tablet (12.5 mg total) by mouth 2 (two) times daily with a meal.  . clopidogrel (PLAVIX) 75 MG tablet Take 1 tablet (75 mg total) by mouth daily.  . ferrous sulfate 325 (65 FE) MG tablet Take 1 tablet (325 mg total) by mouth 3 (three) times daily with meals.  . fluticasone (FLONASE) 50 MCG/ACT nasal spray Place 2 sprays into both nostrils daily.  . furosemide (LASIX) 40 MG tablet Take 1 tablet (40 mg total) by mouth daily.  . hydrALAZINE (APRESOLINE) 25 MG tablet Take 1 tablet (25 mg total) by mouth every 8 (eight) hours.  Marland Kitchen HYDROcodone-acetaminophen (NORCO/VICODIN) 5-325 MG tablet Take 1 tablet by mouth every 6 (six) hours as needed for moderate pain. (Patient not taking: Reported on 05/06/2019)  . insulin aspart (NOVOLOG) 100 UNIT/ML injection Inject 6 Units into the skin 2 (two) times daily with breakfast and lunch. If reading is between 70-100 give after eating. Hold if reading is less than 70 or if resident will not eat  . Insulin Glargine (LANTUS SOLOSTAR) 100 UNIT/ML Solostar Pen Inject 18 Units into the skin at bedtime.   . Ipratropium-Albuterol (COMBIVENT RESPIMAT) 20-100 MCG/ACT  AERS respimat Inhale 1 puff into the lungs every 4 (four) hours as needed for wheezing.  . Melatonin 5 MG TABS Take 5 mg by mouth at bedtime.   . Multiple Vitamin (MULTIVITAMIN WITH MINERALS) TABS tablet Take 1 tablet by mouth daily.  Marland Kitchen omeprazole (PRILOSEC) 20 MG capsule Take 20 mg by mouth daily.  . polyethylene glycol (MIRALAX / GLYCOLAX) packet Take 17 g by mouth daily.  . Probiotic Product (ALIGN) 4 MG CAPS Take 1 capsule by mouth daily.  Marland Kitchen senna (SENOKOT) 8.6 MG tablet Take 1 tablet by mouth daily.  . tamsulosin (FLOMAX) 0.4 MG CAPS capsule Take 1 capsule (0.4 mg total) by mouth daily.   No facility-administered encounter medications on file as of 11/13/2019.    PHYSICAL EXAM:   General: NAD, pleasant male Cardiovascular: regular rate and rhythm Pulmonary: clear ant fields Neurological:  w/c dependent  Malary Aylesworth Prince [place], NP

## 2019-11-16 ENCOUNTER — Other Ambulatory Visit: Payer: Self-pay

## 2020-01-29 ENCOUNTER — Encounter: Payer: Self-pay | Admitting: Nurse Practitioner

## 2020-01-29 ENCOUNTER — Non-Acute Institutional Stay: Payer: Medicare Other | Admitting: Nurse Practitioner

## 2020-01-29 DIAGNOSIS — I509 Heart failure, unspecified: Secondary | ICD-10-CM

## 2020-01-29 DIAGNOSIS — Z515 Encounter for palliative care: Secondary | ICD-10-CM

## 2020-01-29 NOTE — Progress Notes (Signed)
Therapist, nutritional Palliative Care Consult Note Telephone: [phone]  Fax: [phone]  PATIENT NAME: Damon [name] DOB: [birth year] MRN: [id number]  PRIMARY CARE PROVIDER:Dr Hodges/[facility] REFERRING PROVIDER:Dr [facility] RESPONSIBLE PARTY:Self Damon [name] spouse [phone]  RECOMMENDATIONS and PLAN: 1.ACP:DNR,  in Vynca/Epic; wishes are to continue to go to hospital if needed, treat what is treatable, yes to antiobiotic, IVF, diagnostic testing, lab testing  2.Palliative care encounter Palliative medicine team will continue to support patient, patient's family, and medical team. Visit consisted of counseling and education dealing with the complex and emotionally intense issues of symptom management and palliative care in the setting of serious and potentially life-threatening illness  3. F/u 3 months for ongoing monitoring chronic disease progression, decrease hospitalizations, monitoring symptoms appetite  I spent 60 minutes providing this consultation,  Starting at 4:20pm. More than 50% of the time in this consultation was spent coordinating communication.   HISTORY OF PRESENT ILLNESS:  [name] is a 69 y.o. year old male with multiple medical problems including Ischemic cardiomyopathy, Chronic diastolic congestive heart failure, coronary artery disease, diabetes, chronic kidney disease, COPD, peripheral vascular disease, osteoarthritis, BPH, hyperlipidemia, anemia of chronic disease, Right BKA, tobacco abuse. [name] continues to reside at [facility]. [name] does transfer himself to the wheelchair and his mobile wherever he wishes to go. [name] does perform adl's, dresses himself with minimal assistance. [name] does toilet himself. Mr Stuck feeds himself and appetite has been good with current weight 161.1 lb. No recent wounds, falls, hospitalizations,  infections. [name] did get new eyeglasses. [name] is followed by Dental at the facility. Last Primary Provider [facility] Nurse Practitioner Note 10 / 1 / 2021 for follow-up COPD. Medical goals focus on DNR, do not intubate, no feeding to but wishes are for blood transfusion, diagnostic testing, hospitalization, lab testing, IV hydration, antibiotics. Staff endorses no new changes our concerns with [name]. As present [name] is sitting in the wheelchair in his room, appears comfortable, no distress. No visitors present. I visited and observed [name]. We talked about purpose of palliative care visit. [name] in agreement. We talked about how to use feeling. [name] endorses he is doing well. We talked about symptoms of pain and shortness of breath which he denies. We talked about mobility in the importance of exercising. We talked about walking. Mr Bonfanti endorses he has not been walking as much as he should. We talked about smoking cessation. We talked about possibly switching the time that he spends on the smoking patio with efforts to be in the gym walking at the facility. [name] endorses is he will think about that goal. Until then he wishes to continues to smoke. We talked about his appetite. [name] talked about the challenges living and skilled facility. We talked about coping strategies. Medical goals review. We talked about his wife as she visited him yesterday. We talked about how hard it is for him to be at the facility when she is at home. We talked about quality of life. Therapeutic listening and emotional support provided. We talked about role of palliative care and plan of care. Discussed will follow up in 3 months if needed or sooner should he declined. [name] in agreement. I have attempted to contact his wife for update on palliative care visit. I update nursing staff no new changes at present time.  Palliative  Care was asked to help to continue to address goals of care.   CODE  STATUS: DNR  PPS: 50% HOSPICE ELIGIBILITY/DIAGNOSIS: TBD  PAST MEDICAL HISTORY:  Past Medical History:  Diagnosis Date  . CAD (coronary artery disease)    a. 01/2016 MI/PCI: LM nl, LAD 163m/d (3.25 x 28 Xience DES), 40d, LCX 30ost, OM1 95 (staged - 2.75 x 18 Xience Alpine DES), OM2/3 min irregs, RCA 4m.  . Chronic combined systolic and diastolic CHF (congestive heart failure) (HCC)    a. 01/2016 Echo: Ef 25-30%, Gr1 DD, mild AI, mildly dil LA;  b. 02/2016 Echo: EF 30-35%, apical AK, antsept and ant HK, nl RV fxn.  . CKD (chronic kidney disease), stage III (HCC)   . COPD (chronic obstructive pulmonary disease) (HCC)   . Diabetes mellitus without complication (HCC)   . Diverticulitis   . Essential hypertension   . Ischemic cardiomyopathy    a. 01/2016 Echo: Ef 25-30%;  b. 02/2016 Echo: EF 30-35%.  Marland Kitchen LBBB (left bundle branch block)   . PAD (peripheral artery disease) (HCC)    a. 10/2015 s/p PTA and stenting of the RCIA, LCIA, and PTA of the LEIA (Schnier); b. 04/2016 PTA or R tibioperoneal trunk & prox PT, PTA/DBA of R Pop and SFA, Viabahn covered stent x 2 to the R SFA and pop (33mm x 25 cm & 63mm x 15cm).  . Tobacco abuse     SOCIAL HX:  Social History   Tobacco Use  . Smoking status: Current Every Day Smoker    Packs/day: 0.50    Years: 30.00    Pack years: 15.00    Types: Cigarettes  . Smokeless tobacco: Current User  Substance Use Topics  . Alcohol use: No    ALLERGIES: No Known Allergies    PHYSICAL EXAM:   General: NAD, pleasant male Cardiovascular: regular rate and rhythm Pulmonary: clear ant fields Neurological: w/c dependent  Arelly Whittenberg Prince [place], NP

## 2020-02-01 ENCOUNTER — Other Ambulatory Visit: Payer: Self-pay

## 2020-03-25 ENCOUNTER — Other Ambulatory Visit: Payer: Self-pay

## 2020-03-25 ENCOUNTER — Encounter: Payer: Self-pay | Admitting: Nurse Practitioner

## 2020-03-25 ENCOUNTER — Non-Acute Institutional Stay: Payer: Medicare Other | Admitting: Nurse Practitioner

## 2020-03-25 DIAGNOSIS — I509 Heart failure, unspecified: Secondary | ICD-10-CM

## 2020-03-25 DIAGNOSIS — Z515 Encounter for palliative care: Secondary | ICD-10-CM

## 2020-03-25 NOTE — Progress Notes (Signed)
Therapist, nutritional Palliative Care Consult Note Telephone: [phone]  Fax: [phone]  PATIENT NAME: Damon [name] DOB: 09/22/1950 MRN: [id number]  REFERRING PROVIDER:[facility] RESPONSIBLE PARTY:Self Darrell Jewel spouse [phone]  RECOMMENDATIONS and PLAN: 1.ACP:DNR, in Vynca/Epic; wishes are to continue to go to hospital if needed, treat what is treatable, yes to antiobiotic, IVF, diagnostic testing, lab testing  2.Palliative care encounter Palliative medicine team will continue to support patient, patient's family, and medical team. Visit consisted of counseling and education dealing with the complex and emotionally intense issues of symptom management and palliative care in the setting of serious and potentially life-threatening illness  3. F/u 3 months for ongoing monitoring chronic disease progression, decrease hospitalizations, monitoring symptoms appetite  I spent 35 minutes providing this consultation, starting at 12:30pm. More than 50% of the time in this consultation was spent coordinating communication.   HISTORY OF PRESENT ILLNESS:  RAEKWAN [name] is a 69 y.o. year old male with multiple medical problems including Ischemic cardiomyopathy, Chronic diastolic congestive heart failure, coronary artery disease, diabetes, chronic kidney disease, COPD, peripheral vascular disease, osteoarthritis, BPH, hyperlipidemia, anemia of chronic disease, Right BKA, tobacco abuse. Damon [name] continues to reside at [facility] at [facility]. Damon [name] transfers himself in a wheelchair and has been wearing his prosthetic to his his right leg. Damon [name] has been becoming more independent. He does have mobility in his wheelchair to propel at the facility including to the smoking patio.  Damon [name] declined smoking cessation. Staff endorses no recent falls, wounds, infections, hospitalizations. Appetite has  been fair with current weight 163 weight gain and BMI 25.5. No new changes are concerned. I present Damon [name] is sitting in the wheelchair in his room. I visited and observed Damon [name] in his room, sitting in w/c, appears comfortable. Damon [name] and I talked about purpose of Palliative care visit. Damon [name] in agreement. Smile, very interactive stating he is doing well. We talked about his prosthetic that he was currently wearing two is right leg.. praised Mr [name] for wearing his prosthetic. We talked about the importance of mobility, socialization. We talked about its appetite which is good. We talked about symptoms of pain which at present time Damon [name] endorses he is comfortable. We talked about upcoming holiday plans. Mr [name] is hoping that he would be able to go home for Christmas with his wife. Damon [name] and I talked about his wife who visits him frequently, very very supportive. We talked about medical goals of care. We talked about quality of life and challenges residing at facility. We talked about role of Palliative care and plan of care. We talked about next Palliative care visit in 3 months if needed or sooner should he declined. [name] in agreement. Damon [name] endorses he will communicate with his wife about Palliative care visit. I updated staff no new changes to current goals or plan of care.  Palliative Care was asked to help to continue to address goals of care.   CODE STATUS: DNR  PPS: 50% HOSPICE ELIGIBILITY/DIAGNOSIS: TBD  PAST MEDICAL HISTORY:  Past Medical History:  Diagnosis Date  . CAD (coronary artery disease)    a. 01/2016 MI/PCI: LM nl, LAD 138m/d (3.25 x 28 Xience DES), 40d, LCX 30ost, OM1 95 (staged - 2.75 x 18 Xience Alpine DES), OM2/3 min irregs, RCA 24m.  . Chronic combined systolic and diastolic CHF (congestive heart failure) (HCC)  a. 01/2016 Echo: Ef 25-30%, Gr1 DD, mild AI, mildly dil LA;  b. 02/2016 Echo: EF 30-35%, apical AK, antsept and ant HK, nl RV fxn.   . CKD (chronic kidney disease), stage III (HCC)   . COPD (chronic obstructive pulmonary disease) (HCC)   . Diabetes mellitus without complication (HCC)   . Diverticulitis   . Essential hypertension   . Ischemic cardiomyopathy    a. 01/2016 Echo: Ef 25-30%;  b. 02/2016 Echo: EF 30-35%.  Marland Kitchen LBBB (left bundle branch block)   . PAD (peripheral artery disease) (HCC)    a. 10/2015 s/p PTA and stenting of the RCIA, LCIA, and PTA of the LEIA (Schnier); b. 04/2016 PTA or R tibioperoneal trunk & prox PT, PTA/DBA of R Pop and SFA, Viabahn covered stent x 2 to the R SFA and pop (80mm x 25 cm & 31mm x 15cm).  . Tobacco abuse     SOCIAL HX:  Social History   Tobacco Use  . Smoking status: Current Every Day Smoker    Packs/day: 0.50    Years: 30.00    Pack years: 15.00    Types: Cigarettes  . Smokeless tobacco: Current User  Substance Use Topics  . Alcohol use: No    ALLERGIES: No Known Allergies    PHYSICAL EXAM:   General: NAD, pleasant male Cardiovascular: regular rate and rhythm Pulmonary: clear ant fields Neurological: W/C dependent  Damon Dowe Prince [place], NP

## 2020-04-27 ENCOUNTER — Other Ambulatory Visit: Payer: Self-pay

## 2020-04-27 ENCOUNTER — Non-Acute Institutional Stay: Payer: Medicare Other | Admitting: Nurse Practitioner

## 2020-04-27 ENCOUNTER — Encounter: Payer: Self-pay | Admitting: Nurse Practitioner

## 2020-04-27 VITALS — BP 135/46 | HR 78 | Temp 98.8°F | Resp 18 | Wt 166.7 lb

## 2020-04-27 DIAGNOSIS — Z515 Encounter for palliative care: Secondary | ICD-10-CM

## 2020-04-27 DIAGNOSIS — I509 Heart failure, unspecified: Secondary | ICD-10-CM

## 2020-04-27 NOTE — Progress Notes (Signed)
Therapist, nutritional Palliative Care Consult Note Telephone: [phone]  Fax: [phone]  PATIENT NAME: Damon [name] DOB: 1950-10-04 MRN: [id number]  PRIMARY CARE PROVIDER:   [facility] [name] RESPONSIBLE PARTY:Self Damon [name] spouse [phone]  RECOMMENDATIONS and PLAN: 1.ACP:DNR, in Vynca/Epic; wishes are to continue to go to hospital if needed, treat what is treatable, yes to antiobiotic, IVF, diagnostic testing, lab testing  2.Palliative care encounter Palliative medicine team will continue to support patient, patient's family, and medical team. Visit consisted of counseling and education dealing with the complex and emotionally intense issues of symptom management and palliative care in the setting of serious and potentially life-threatening illness  3. F/u 3 months for ongoing monitoring chronic disease progression, decrease hospitalizations, monitoring symptoms appetite  I spent 50 minutes providing this consultation,  Starting at 12:35pm. More than 50% of the time in this consultation was spent coordinating communication.   HISTORY OF PRESENT ILLNESS:  Damon [name] is a 70 y.o. year old male with multiple medical problems including Ischemic cardiomyopathy, Chronic diastolic congestive heart failure, coronary artery disease, diabetes, chronic kidney disease, COPD, peripheral vascular disease, osteoarthritis, BPH, hyperlipidemia, anemia of chronic disease, Right BKA, tobacco abuse. Damon [name] continues to reside in [facility] at [facility]. Damon [name]  does transferred to the wheelchair but requires assistance for adl's, bathing and dressing. Damon [name]  does continue to wear his prosthetic intermittently. Damon [name]  is mobile throughout the facility and often does go and smoke. No falls, wounds, hospitalizations, infections. Care plan meeting held on 1/18 / 2022 with no changes to current goals or  plan of care. Last [employer] Primary Provider note 12 / 8 / 2021 for anemia of chronic disease. Wishes are for DNR, do not intubate, no feeding tube but wishes for antibiotic, blood transfusion, diagnostic testing, hospitalization, hydration, lab testing. Current weight 166.7 lbs with 4.7 lb weight gain and BMI 26.1. Damon [name] is currently on a diabetic diet regular texture regular liquid consistency. Blood sugars have been stable ranging from 93 to 220 managed with insulin therapy regular and Lantus. Damon [name] is able to advocate for his needs. Damon [name] wife does come frequently to visit and is extremely supportive. At present mister late is sitting in the wheelchair at the front of the building. Damon [name]  and I talked about purpose of Palliative care visit. Damon [name] in agreement. We talked about how Damon [name] has been feeling today. Damon [name]  endorses he is doing well. We talked about symptoms of pain and shortness of breath which he denies. We talked about smoking cessation but she declines. We talked about mobility increase social interaction and being outside. We talked about the importance of self care. Who talks about self training. Damon [name] endorses he does feel like he is doing more for himself. We talked about the last time his wife has visited. We talked about residing at still facility and challenges with barriers with coping strategies. We talked about quality of life. Medical goals reviewed. We talked about role Palliative care and plan of care. Discuss will follow up in three months if needed or sooner should he declined. Damon [name] in agreement. Therapeutic listening and emotional support provided. Questions answered to satisfaction. I updated nursing staff.  Palliative Care was asked to help to continue to address goals of care.   CODE STATUS: DNR  PPS: 50% HOSPICE ELIGIBILITY/DIAGNOSIS: TBD  PAST MEDICAL HISTORY:  Past Medical History:  Diagnosis Date  . CAD (coronary artery disease)     a. 01/2016 MI/PCI: LM nl, LAD 125m/d (3.25 x 28 Xience DES), 40d, LCX 30ost, OM1 95 (staged - 2.75 x 18 Xience Alpine DES), OM2/3 min irregs, RCA 44m.  . Chronic combined systolic and diastolic CHF (congestive heart failure) (HCC)    a. 01/2016 Echo: Ef 25-30%, Gr1 DD, mild AI, mildly dil LA;  b. 02/2016 Echo: EF 30-35%, apical AK, antsept and ant HK, nl RV fxn.  . CKD (chronic kidney disease), stage III (HCC)   . COPD (chronic obstructive pulmonary disease) (HCC)   . Diabetes mellitus without complication (HCC)   . Diverticulitis   . Essential hypertension   . Ischemic cardiomyopathy    a. 01/2016 Echo: Ef 25-30%;  b. 02/2016 Echo: EF 30-35%.  Marland Kitchen LBBB (left bundle branch block)   . PAD (peripheral artery disease) (HCC)    a. 10/2015 s/p PTA and stenting of the RCIA, LCIA, and PTA of the LEIA (Schnier); b. 04/2016 PTA or R tibioperoneal trunk & prox PT, PTA/DBA of R Pop and SFA, Viabahn covered stent x 2 to the R SFA and pop (31mm x 25 cm & 89mm x 15cm).  . Tobacco abuse     SOCIAL HX:  Social History   Tobacco Use  . Smoking status: Current Every Day Smoker    Packs/day: 0.50    Years: 30.00    Pack years: 15.00    Types: Cigarettes  . Smokeless tobacco: Current User  Substance Use Topics  . Alcohol use: No    ALLERGIES: No Known Allergies    PHYSICAL EXAM:   General: NAD, pleasant male Cardiovascular: regular rate and rhythm Pulmonary: clear ant fields Neurological: w/c dependent  Damon Farnam Prince [place], NP

## 2020-07-11 ENCOUNTER — Encounter: Payer: Self-pay | Admitting: Nurse Practitioner

## 2020-07-11 ENCOUNTER — Non-Acute Institutional Stay: Payer: Medicare Other | Admitting: Nurse Practitioner

## 2020-07-11 ENCOUNTER — Other Ambulatory Visit: Payer: Self-pay

## 2020-07-11 VITALS — BP 95/56 | HR 78 | Temp 97.5°F | Wt 166.7 lb

## 2020-07-11 DIAGNOSIS — I509 Heart failure, unspecified: Secondary | ICD-10-CM

## 2020-07-11 DIAGNOSIS — E119 Type 2 diabetes mellitus without complications: Secondary | ICD-10-CM

## 2020-07-11 DIAGNOSIS — Z515 Encounter for palliative care: Secondary | ICD-10-CM

## 2020-07-11 NOTE — Progress Notes (Signed)
Therapist, nutritional Palliative Care Consult Note Telephone: [phone]  Fax: [phone]  PATIENT NAME: YOUSOF ALDERMAN DOB: 10-28-1950 MRN: [id number] PRIMARY CARE PROVIDER:   [facility] RESPONSIBLE PARTY:Self Darrell Jewel spouse [phone]  RECOMMENDATIONS and PLAN: 1.ACP:DNR, in Vynca/Epic; wishes are to continue to go to hospital if needed, treat what is treatable, yes to antiobiotic, IVF, diagnostic testing, lab testing  2.Palliative care encounter Palliative medicine team will continue to support patient, patient's family, and medical team. Visit consisted of counseling and education dealing with the complex and emotionally intense issues of symptom management and palliative care in the setting of serious and potentially life-threatening illness  3. F/u 3 months for ongoing monitoring chronic disease progression, decrease hospitalizations, monitoring symptoms appetite  I spent 40 minutes providing this consultation,  Starting at 12:20pm. More than 50% of the time in this consultation was spent coordinating communication.   HISTORY OF PRESENT ILLNESS:  EDILBERTO ROOSEVELT is a 70 y.o. year old male with multiple medical problems including Ischemic cardiomyopathy, Chronic diastolic congestive heart failure, coronary artery disease, diabetes, chronic kidney disease, COPD, peripheral vascular disease, osteoarthritis, BPH, hyperlipidemia, anemia of chronic disease, Right BKA, tobacco abuse. Mr. Salzwedel continues to reside in [facility] at [facility]. Mr. Severns  does transferred to the wheelchair but requires assistance for adl's, bathing and dressing. Mr. Gumina  does continue to wear his prosthetic intermittently. Mr. Mcneese  is mobile throughout the facility and often does go and smoke. No falls, wounds, hospitalizations, infections.  Last primary provider [facility] note 05/31/2020  Comprehensive review diabetes  on going control with medical therapy. Hemeglobin A1C less than 9% at goal. Did receive one dose of glucagon for blood sugar at 55. Remains nicotine dependent. COPD ongoing no recent exacerbations. Chronic kidney disease no symptoms of uremic followed by Nephrology. Right BKA with prosthetic. With prosthetic. No recent fall. Depression on going Is therapy. Medical goals focus on DNR, do not integrate, note, no feeding 2 but wishes are for hospitalization, iv hydration, antibiotics, lab testing, blood transfusion. Recent hospitalization, infection, wounds. At present, Mr. Hoque is sitting in the w/c in his room. I visited and observed Mr. Colquhoun. We talked about purpose of PC visit. Mr. Lingelbach in agreement. We talked about how Mr. Spano was feeling. Mr Beaumier endorses he has been doing okay. He has been wearing his prosthetic. Mr. Greenawalt endorses he continues to get up daily. We talked about smoking cessation which he declines. We talked about symptoms of pain, dyspnea, insomnia which Mr. Ackert denies. We talked about chronic disease progression, DM. We talked about diet, nutritional education discussed. We talked about residing at skilled facility. We talked about medical goals of care. We talked about Mr. Tony wife visiting frequently. We talked about challenges for Mr. Conover residing at facility with decrease independence. We talked about quality of life. We talked about coping strategies. We talked about role PC in POC. We talked about contacting Mrs. Baley with PC update, Mr. Winterbottom endorses he will update Ms. Leyland about visit. No new changes to POC. Will continue to follow, monitor. Mr. Demello in agreement. I updated nursing staff.   04/26/2020 wbc 5.9, hemoglobin 11.3, hemocrit 32.8, platelets 235, sodium 139, potassium 4.3, chloride 108, co2 18, calcium 8.8, bun 39.3, creatinine 2.09, glucose 72, total protein 6.3, albumin 3.8, ALT 15, AST 14  Palliative Care was asked to help to continue to address goals of  care.  CODE STATUS: DNR  PPS: 50% HOSPICE ELIGIBILITY/DIAGNOSIS: TBD  PAST MEDICAL HISTORY:  Past Medical History:  Diagnosis Date  . CAD (coronary artery disease)    a. 01/2016 MI/PCI: LM nl, LAD 145m/d (3.25 x 28 Xience DES), 40d, LCX 30ost, OM1 95 (staged - 2.75 x 18 Xience Alpine DES), OM2/3 min irregs, RCA 77m.  . Chronic combined systolic and diastolic CHF (congestive heart failure) (HCC)    a. 01/2016 Echo: Ef 25-30%, Gr1 DD, mild AI, mildly dil LA;  b. 02/2016 Echo: EF 30-35%, apical AK, antsept and ant HK, nl RV fxn.  . CKD (chronic kidney disease), stage III (HCC)   . COPD (chronic obstructive pulmonary disease) (HCC)   . Diabetes mellitus without complication (HCC)   . Diverticulitis   . Essential hypertension   . Ischemic cardiomyopathy    a. 01/2016 Echo: Ef 25-30%;  b. 02/2016 Echo: EF 30-35%.  Marland Kitchen LBBB (left bundle branch block)   . PAD (peripheral artery disease) (HCC)    a. 10/2015 s/p PTA and stenting of the RCIA, LCIA, and PTA of the LEIA (Schnier); b. 04/2016 PTA or R tibioperoneal trunk & prox PT, PTA/DBA of R Pop and SFA, Viabahn covered stent x 2 to the R SFA and pop (72mm x 25 cm & 20mm x 15cm).  . Tobacco abuse     SOCIAL HX:  Social History   Tobacco Use  . Smoking status: Current Every Day Smoker    Packs/day: 0.50    Years: 30.00    Pack years: 15.00    Types: Cigarettes  . Smokeless tobacco: Current User  Substance Use Topics  . Alcohol use: No    ALLERGIES: No Known Allergies    PHYSICAL EXAM:   General: NAD, pleasant male Cardiovascular: regular rate and rhythm Pulmonary: clear ant fields Neurological: w/c dependent  Bryten Maher Prince [place], NP

## 2020-11-18 ENCOUNTER — Non-Acute Institutional Stay: Payer: Medicare Other | Admitting: Nurse Practitioner

## 2020-11-18 ENCOUNTER — Other Ambulatory Visit: Payer: Self-pay

## 2020-11-18 ENCOUNTER — Encounter: Payer: Self-pay | Admitting: Nurse Practitioner

## 2020-11-18 VITALS — BP 129/63 | HR 88 | Temp 98.1°F | Resp 18 | Wt 166.7 lb

## 2020-11-18 DIAGNOSIS — R0602 Shortness of breath: Secondary | ICD-10-CM

## 2020-11-18 DIAGNOSIS — Z720 Tobacco use: Secondary | ICD-10-CM

## 2020-11-18 DIAGNOSIS — Z515 Encounter for palliative care: Secondary | ICD-10-CM

## 2020-11-18 NOTE — Progress Notes (Signed)
[facility] Pall[facility] Consult Note Telephone: [phone]  Fax: [phone]    Date of encounter: 11/18/20 PATIENT NAME: Damon [name] [address] [place] [address]   [phone] (home)  DOB: [birth year] MRN: [id number] PRIMARY CARE PROVIDER:    Dr Lucianne Lei [facility]  RESPONSIBLE PARTY:    Contact Information     Name Relation Home Work Mobile   Sadarius, Norman [phone]     Rainford,Michael Brother [phone]        I met face to face with patient in facility. Palliative Care was asked to follow this patient by consultation request of  Dr Nona Dell to address advance care planning and complex medical decision making. This is a follow up visit.                                   ASSESSMENT AND PLAN / RECOMMENDATIONS:  Symptom Management/Plan: 1.ACP: DNR,  in Vynca/Epic; wishes are to continue to go to hospital if needed, treat what is treatable, yes to antiobiotic, IVF, diagnostic testing, lab testing   2. Palliative care encounter Palliative medicine team will continue to support patient, patient's family, and medical team. Visit consisted of counseling and education dealing with the complex and emotionally intense issues of symptom management and palliative care in the setting of serious and potentially life-threatening illness   3. F/u 2 months for ongoing monitoring chronic disease progression, decrease hospitalizations, monitoring symptoms appetite  4. Tobacco abuse, smoking cessation discussed, educated, encouraged but declined at present time, agree to contemplate.   5.  Shortness of breath stable secondary to Ischemic cardiomyopathy, Chronic diastolic congestive heart failure. Continue lasix, carvedilol, compliance, weights, monitoring respiratory status, edema  Follow up Palliative Care Visit: Palliative care will continue to follow for complex medical decision making, advance care planning, and  clarification of goals. Return 8 weeks or prn.  I spent 36 minutes providing this consultation. More than 50% of the time in this consultation was spent in counseling and care coordination.  PPS: 50%  Chief Complaint: Follow up visit for palliative consult complex medical decision making  HISTORY OF PRESENT ILLNESS:  SUHAIB GUZZO is a 70 y.o. year old male with multiple medical problems including Ischemic cardiomyopathy, Chronic diastolic congestive heart failure, coronary artery disease, diabetes, chronic kidney disease, COPD, peripheral vascular disease, osteoarthritis, BPH, hyperlipidemia, anemia of chronic disease, Right BKA, tobacco abuse. Mr. Rendell continues to reside in [place] at [facility]. Mr. Geraghty  does transferred to the wheelchair but requires assistance for adl's, bathing and dressing. Mr. Soto  does continue to wear his prosthetic intermittently. Mr. Mulford  is mobile throughout the facility and often does go and smoke. No falls, wounds, hospitalizations, infections. Staff endorses no other changes or concerns. I visited and observed Mr. Blass, he was lying in bed in his room, asleep, awoke with verbal cues. We talked about purpose of PC visit, in agreement..  We talked about how he was feeling. Mr Harada endorses he is doing well. We talked about symptoms of pain, shortness of breath which he denies. We talked about activity, functional abilities, encouraged to be oob. We talked about his appetite. Mr. Rendleman and I talked about sleep patterns, sleep hygiene. We talked about importance of trying to stay up during the day, minimize naps. Mr. Brumett talked about his wife visiting. We talked  about importance of using his prosthetic continuing to attempt to walk. Medical goals reviewed. We talked about role pc in poc. Continue to recommend smoking cessation. Will f/u 2 months or sooner if declines. Updated staff. Mr. Pankonin endorses will update Mrs.  Atiyeh about PC visit  History obtained from review of EMR, discussion with facility staff and Mr. Pamer.  I reviewed available labs, medications, imaging, studies and related documents from the EMR.  Records reviewed and summarized above.   ROS Full 14 system review of systems performed and negative with exception of: as per HPI.   Physical Exam: Constitutional: NAD General: WNWD, pleasant male EYES: lids intact ENMT: oral mucous membranes moist CV: S1S2, RRR, no LE edema Pulmonary: LCTA, no increased work of breathing, no cough, room air Abdomen: soft and non tender MSK: transfers to w/c,  Skin: warm and dry Neuro:  +BLE generalized weakness,  +mild cognitive impairment Psych: non-anxious affect, A and O x 3  Questions and concerns were addressed. The patient/facility staff was encouraged to call with questions and/or concerns. Provided general support and encouragement, no other unmet needs identified   Thank you for the opportunity to participate in the care of Mr. Lasseter.  The palliative care team will continue to follow. Please call our office at [phone] if we can be of additional assistance.   This chart was dictated using voice recognition software.  Despite best efforts to proofread,  errors can occur which can change the documentation meaning.   Z , NP  

## 2021-04-28 ENCOUNTER — Encounter: Payer: Self-pay | Admitting: Nurse Practitioner

## 2021-04-28 ENCOUNTER — Non-Acute Institutional Stay: Payer: Medicare Other | Admitting: Nurse Practitioner

## 2021-04-28 VITALS — BP 132/72 | HR 82 | Temp 97.0°F | Resp 18 | Wt 158.2 lb

## 2021-04-28 DIAGNOSIS — Z515 Encounter for palliative care: Secondary | ICD-10-CM

## 2021-04-28 DIAGNOSIS — Z720 Tobacco use: Secondary | ICD-10-CM

## 2021-04-28 DIAGNOSIS — R5381 Other malaise: Secondary | ICD-10-CM

## 2021-04-28 DIAGNOSIS — R0602 Shortness of breath: Secondary | ICD-10-CM

## 2021-04-28 DIAGNOSIS — I509 Heart failure, unspecified: Secondary | ICD-10-CM

## 2021-04-28 NOTE — Progress Notes (Signed)
[facility] Consult Note Telephone: [phone]  Fax: [phone]    Date of encounter: 04/28/21 9:04 PM PATIENT NAME: Damon [name] [address] [place] [address]   [phone] (home)  DOB: Apr 14, 1950 MRN: [id number] PRIMARY CARE PROVIDER:    [facility]  RESPONSIBLE PARTY:    Contact Information     Name Relation Home Work Mobile   Juanita, Devincent [phone]     Pagliarulo,Michael Brother [phone]        I met face to face with patient  in facility. Palliative Care was asked to follow this patient by consultation request of  [place] to address advance care planning and complex medical decision making. This is a follow up visit.                                  ASSESSMENT AND PLAN / RECOMMENDATIONS:  Symptom Management/Plan: 1.ACP: DNR,  in Vynca/Epic; wishes are to continue to go to hospital if needed, treat what is treatable, yes to antiobiotic, IVF, diagnostic testing, lab testing   2. Palliative care encounter Palliative medicine team will continue to support patient, patient's family, and medical team. Visit consisted of counseling and education dealing with the complex and emotionally intense issues of symptom management and palliative care in the setting of serious and potentially life-threatening illness   3. Debility secondary to poor motivation; discussed at length with Mr and Mrs Dansereau about using prosthetic, going to the gym, exercising, ambulating. [name] endorses he understands he needs to ambulate, use his prosthetic, he will try and set goals to go 3 times a week. Will need positive motivation as when staff approaches [name] to do this he declines, also declining bathing intermit. Discussed importance of self care.   4. Tobacco abuse, smoking cessation discussed, educated, encouraged but declined at present time, agree to contemplate.    5.  Shortness of breath  stable secondary to Ischemic cardiomyopathy, Chronic diastolic congestive heart failure. Continue lasix, carvedilol, compliance, weights, monitoring respiratory status, edema  Follow up Palliative Care Visit: Palliative care will continue to follow for complex medical decision making, advance care planning, and clarification of goals. Return 12 weeks or prn.  I spent 69 minutes providing this consultation starting at 11:50 am. More than 50% of the time in this consultation was spent in counseling and care coordination. PPS: 50%  HOSPICE ELIGIBILITY/DIAGNOSIS: TBD  Chief Complaint: Follow up palliative consult for complex medical decision making  HISTORY OF PRESENT ILLNESS:  DAXTON NYDAM is a 71 y.o. year old male  with multiple medical problems including Ischemic cardiomyopathy, Chronic diastolic congestive heart failure, coronary artery disease, diabetes, chronic kidney disease, COPD, peripheral vascular disease, osteoarthritis, BPH, hyperlipidemia, anemia of chronic disease, Right BKA, tobacco abuse. [name] continues to reside in [place] at [facility]. [name]  does transferred to the wheelchair but requires assistance for adl's, bathing and dressing. [name]  does continue to wear his prosthetic intermittently. [name]  is mobile throughout the facility and often does go and smoke. No falls, wounds, hospitalizations, infections. Staff endorses no other changes or concerns. I visited and observed [name], is sitting in the w/c in his room. We talked about purpose of PC visit, in agreement.. We talked about how he was feeling. [name] endorses he is doing well. We  talked about symptoms of pain, shortness of breath, ros  which he denies. We talked about activity, functional abilities, encouraged to be oob. We talked about his appetite. [name] and I talked about sleep patterns, sleep hygiene. We talked about importance of trying to stay up  during the day, minimize naps. Mr. Both talked about his wife visiting. We talked about importance of using his prosthetic continuing to attempt to walk. Medical goals reviewed. We talked about role pc in poc. Continue to recommend smoking cessation, though [name] declines. I called [name], clinical update given. We talked about challenges with [name] motivation to take a bath, ambulate with his prosthetic, stop smoking. Mrs Danielski talked at length of her frustration with [name] not wanting to do things for himself. We talked about caregiver stress, coping strategies. We talked about residing facility. We talked about plan, medical goals reviewed. Therapeutic listening, emotional support provided. Questions answered. I updated staff.   History obtained from review of EMR, discussion with Facility staff, Mrs and [name].  I reviewed available labs, medications, imaging, studies and related documents from the EMR.  Records reviewed and summarized above.   ROS 10 point system reviewed with facility staff and [name] all negative except HPI  Physical Exam: Constitutional: NAD General: w/c dependent pleasant male EYES: lids intact ENMT: oral mucous membranes moist CV: S1S2, RRR, no LE edema Pulmonary: LCTA, no increased work of breathing, no cough, room air Abdomen: normo-active BS + 4 quadrants, soft and non tender MSK: w/c dependent; able to ambulate with prosthetic right BKA Skin: warm and dry Neuro:  no generalized weakness,  no cognitive impairment Psych: non-anxious affect, A and O x 3  Thank you for the opportunity to participate in the care of [name].  The palliative care team will continue to follow. Please call our office at [phone] if we can be of additional assistance.   This chart was dictated using voice recognition software.  Despite best efforts to proofread,  errors can occur which can change the documentation meaning.   Questions and concerns were  addressed. Provided general support and encouragement, no other unmet needs identified   Ahnna Dungan Ihor Gully, NP

## 2021-05-01 ENCOUNTER — Other Ambulatory Visit: Payer: Self-pay

## 2021-06-16 ENCOUNTER — Non-Acute Institutional Stay: Payer: Medicare Other | Admitting: Nurse Practitioner

## 2021-06-16 ENCOUNTER — Encounter: Payer: Self-pay | Admitting: Nurse Practitioner

## 2021-06-16 ENCOUNTER — Other Ambulatory Visit: Payer: Self-pay

## 2021-06-16 DIAGNOSIS — R5381 Other malaise: Secondary | ICD-10-CM

## 2021-06-16 DIAGNOSIS — Z515 Encounter for palliative care: Secondary | ICD-10-CM

## 2021-06-16 DIAGNOSIS — R0602 Shortness of breath: Secondary | ICD-10-CM

## 2021-06-16 DIAGNOSIS — I509 Heart failure, unspecified: Secondary | ICD-10-CM

## 2021-06-16 NOTE — Addendum Note (Signed)
Addended by: Sieara Bremer Z on: 06/16/2021 07:29 PM ? ? Modules accepted: Level of Service ? ?

## 2021-06-16 NOTE — Progress Notes (Signed)
? ? ?Manufacturing [facility] ?Community Palliative Care Consult Note ?Telephone: [phone]  ?Fax: [phone]  ? ? ?Date of encounter: 06/16/21 ?7:12 PM ?PATIENT NAME: Damon [name] ?[address] ?[place] [address]   ?[phone] (home)  ?DOB: [birth year] ?MRN: [id number] ?PRIMARY CARE PROVIDER:    ?[facility] ? ?RESPONSIBLE PARTY:    ?Contact Information   ? ? Name Relation Home Work Mobile  ? Damon, [name] Spouse [phone]    ? [name],Damon Brother [phone]    ? ?  ? ?I met face to face with patient in facility. Palliative Care was asked to follow this patient by consultation request of  Damon [name] to address advance care planning and complex medical decision making. This is a follow up visit.                                  ?ASSESSMENT AND PLAN / RECOMMENDATIONS:  ?Symptom Management/Plan: ?1.ACP: DNR,  in Vynca/Epic; wishes are to continue to go to hospital if needed, treat what is treatable, yes to antiobiotic, IVF, diagnostic testing, lab testing ?  ?2. Palliative care encounter Palliative medicine team will continue to support patient, patient's family, and medical team. Visit consisted of counseling and education dealing with the complex and emotionally intense issues of symptom management and palliative care in the setting of serious and potentially life-threatening illness ?  ?3. Debility secondary to poor motivation; discussed at length with Mr and Mrs Damon [name] about using prosthetic, going to the gym, exercising, ambulating. Mr. Damon [name] endorses he understands he needs to ambulate, use his prosthetic, he will try and set goals to go 3 times a week. Will need positive motivation as when staff approaches Mr. Damon [name] to do this he declines, also declining bathing intermit. Discussed importance of self care.  ?  ?4. Tobacco abuse, smoking cessation discussed, educated, encouraged but declined at present time, agree to contemplate.  ?  ?5.  Shortness of breath  stable secondary to Ischemic cardiomyopathy, Chronic diastolic congestive heart failure. Continue lasix, carvedilol, compliance, weights, monitoring respiratory status, edema ? ?Follow up Palliative Care Visit: Palliative care will continue to follow for complex medical decision making, advance care planning, and clarification of goals. Return 8 weeks or prn. ? ?I spent 43 minutes providing this consultation. More than 50% of the time in this consultation was spent in counseling and care coordination. ? ?PPS: 50% ? ?Chief Complaint: Follow up palliative consult for complex medical decision making ? ?HISTORY OF PRESENT ILLNESS:  BREVON Damon [name] is a 71 y.o. year old male  with medical problems including Ischemic cardiomyopathy, Chronic diastolic congestive heart failure, coronary artery disease, diabetes, chronic kidney disease, COPD, peripheral vascular disease, osteoarthritis, BPH, hyperlipidemia, anemia of chronic disease, Right BKA, tobacco abuse. Mr. Damon [name] continues to reside in [place] at [facility]. Mr. Damon [name]  does transferred to the wheelchair but requires assistance for adl's, bathing and dressing. Mr. Damon [name]  does continue to wear his prosthetic intermittently. Mr. Damon [name]  is mobile throughout the facility and often does go and smoke. No falls, wounds, hospitalizations, infections. Staff endorses no other changes or concerns. I visited and observed Mrs and Mr. Damon [name], is sitting in the w/c in his room. We talked about purpose of PC visit, in agreement.. We talked about how he was feeling. Mr Damon [name] endorses he is doing well. We talked about symptoms of pain, shortness  of breath, ros  which he denies. We talked about activity, functional abilities, encouraged to be oob, self independence. We talked about his appetite. We talked about importance of using his prosthetic continuing to attempt to walk. Medical goals reviewed. We talked about role pc in poc. Continue to  recommend smoking cessation, though Mr Damon [name] declines. We talked about challenges with Mr Damon [name] motivation to take a bath, ambulate with his prosthetic, stop smoking. We talked about residing facility. We talked about plan, medical goals reviewed. Therapeutic listening, emotional support provided. Questions answered. I updated staff.  .  ? ?History obtained from review of EMR, discussion with facility staff, Mrs and Mr. Damon [name]  ?I reviewed available labs, medications, imaging, studies and related documents from the EMR.  Records reviewed and summarized above.  ? ?ROS ?10 point system reviewed with staff, Mr and Mrs Damon [name] all negative except HPI ? ?Physical Exam: ?Constitutional: NAD ?General: pleasant male ?EYES:  lids intact ?ENMT: oral mucous membranes moist ?CV: S1S2, RRR, no LE edema ?Pulmonary: LCTA, no increased work of breathing, no cough, room air ?Abdomen: normo-active BS + 4 quadrants, soft and non tender ?MSK: w/c dependent ?Skin: warm and dry ?Neuro:  no generalized weakness,  no cognitive impairment ?Psych: non-anxious affect, A and O x 3 ?Thank you for the opportunity to participate in the care of Mr. Damon [name]  The palliative care team will continue to follow. Please call our office at [phone] if we can be of additional assistance.  ? ?Velita Quirk [name], NP  ?  ?

## 2021-07-24 ENCOUNTER — Non-Acute Institutional Stay: Payer: Medicare Other | Admitting: Nurse Practitioner

## 2021-07-24 ENCOUNTER — Encounter: Payer: Self-pay | Admitting: Nurse Practitioner

## 2021-07-24 VITALS — BP 129/60 | HR 65 | Temp 97.3°F | Resp 18 | Wt 160.8 lb

## 2021-07-24 DIAGNOSIS — R5381 Other malaise: Secondary | ICD-10-CM

## 2021-07-24 DIAGNOSIS — I509 Heart failure, unspecified: Secondary | ICD-10-CM

## 2021-07-24 DIAGNOSIS — Z515 Encounter for palliative care: Secondary | ICD-10-CM

## 2021-07-24 DIAGNOSIS — R0602 Shortness of breath: Secondary | ICD-10-CM

## 2021-07-24 DIAGNOSIS — Z720 Tobacco use: Secondary | ICD-10-CM

## 2021-07-24 NOTE — Progress Notes (Signed)
? ? ?Manufacturing engineer ?Community Palliative Care Consult Note ?Telephone: [phone]  ?Fax: [phone]  ? ? ?Date of encounter: 07/24/21 ?2:25 PM ?PATIENT NAME: Damon [name] ?[address] ?[place] [address]   ?[phone] (home)  ?DOB: [birth year] ?MRN: [id number] ?PRIMARY CARE PROVIDER:    ?[facility] ? ?RESPONSIBLE PARTY:    ?Contact Information   ? ? Name Relation Home Work Mobile  ? Damon [name], Damon [name] Spouse [phone]    ? Damon [name],Damon [name] Brother [phone]    ? ?  ? ?I met face to face with patient in facility. Palliative Care was asked to follow this patient by consultation request of  [place] to address advance care planning and complex medical decision making. This is a follow up visit.                                  ?ASSESSMENT AND PLAN / RECOMMENDATIONS:  ?Symptom Management/Plan: ?1.ACP: DNR,  in Vynca/Epic; wishes are to continue to go to hospital if needed, treat what is treatable, yes to antiobiotic, IVF, diagnostic testing, lab testing ?  ?2. Palliative care encounter Palliative medicine team will continue to support patient, patient's family, and medical team. Visit consisted of counseling and education dealing with the complex and emotionally intense issues of symptom management and palliative care in the setting of serious and potentially life-threatening illness ?  ?3. Debility secondary to poor motivation; discussed at length with Damon [name] about using prosthetic, going to the gym, exercising, ambulating, self care and more self independence. Damon [name] endorses he understands he needs to ambulate, use his prosthetic, he will try and set goals to go 3 times a week from last PC visit which he did not do, not motivated to go but will go out to smoking patio. Discussed since he declines smoking cessation, maybe every time he goes to the smoking patio he comes back to do 1 exercise either in the gym or band or something independent for himself.  Damon [name] endorses he will try that strategy. ?  ?4. Tobacco abuse, smoking cessation discussed, educated, encouraged but declined at present time, agree to contemplate.  ?  ?5.  Shortness of breath stable secondary to Ischemic cardiomyopathy, Chronic diastolic congestive heart failure. Continue lasix, carvedilol, compliance, weights, monitoring respiratory status, edema ? ?Follow up Palliative Care Visit: Palliative care will continue to follow for complex medical decision making, advance care planning, and clarification of goals. Return 8 weeks or prn. ? ?I spent 49 minutes providing this consultation. More than 50% of the time in this consultation was spent in counseling and care coordination. ?PPS: 40% ? ?Chief Complaint: Follow up palliative consult for complex medical decision making ? ?HISTORY OF PRESENT ILLNESS:  [name] is a 71 y.o. year old male  with medical problems including Ischemic cardiomyopathy, Chronic diastolic congestive heart failure, coronary artery disease, diabetes, chronic kidney disease, COPD, peripheral vascular disease, osteoarthritis, BPH, hyperlipidemia, anemia of chronic disease, Right BKA, tobacco abuse. Damon. Nazaire continues to reside in [place] at [facility]. Damon. [name]  does transferred to the wheelchair but requires assistance for adl's, bathing and dressing. Damon. [name]  does continue to wear his prosthetic intermittently. Damon. [name]  is mobile throughout the facility and often does go and smoke. No falls, wounds, hospitalizations, infections. Staff endorses no other changes or concerns. I visited and observed Damon [name], is  sitting in the w/c in doorway of his room. We talked about purpose of PC visit, in agreement.. We talked about how he was feeling. Damon [name] endorses he is doing well. We talked about symptoms of pain, shortness of breath, ros  which he denies. We talked about activity, functional abilities, encouraged to be oob,  self independence. discussed at length with Damon [name] about using prosthetic, going to the gym, exercising, ambulating, self care and more self independence. Damon. [name] endorses he understands he needs to ambulate, use his prosthetic, he will try and set goals to go 3 times a week from last PC visit which he did not do, not motivated to go but will go out to smoking patio. Discussed since he declines smoking cessation, maybe every time he goes to the smoking patio he comes back to do 1 exercise either in the gym or band or something independent for himself. We talked about challenges with Damon [name] motivation to take a bath, ambulate with his prosthetic, stop smoking. We talked about residing facility. Damon. [name] endorses he will try that strategy. We talked about his appetite. We talked about importance of using his prosthetic continuing to attempt to walk. Medical goals reviewed. We talked about role pc in poc. Continue to recommend smoking cessation, though Damon [name] declines. We talked about challenges with Damon [name] motivation to take a bath, ambulate with his prosthetic, stop smoking. We talked about residing facility. We talked about plan, medical goals reviewed. Therapeutic listening, emotional support provided. Questions answered. I updated staff. I attempted to contact Damon [name], no answer for update on PC visit ? ?History obtained from review of EMR, discussion with facility staff and Damon. [name]  ?I reviewed available labs, medications, imaging, studies and related documents from the EMR.  Records reviewed and summarized above.  ? ?ROS ?10 point system reviewed with Damon. [name] all negative except HPI ? ?Physical Exam: ?Constitutional: NAD ?General: pleasant male, w/c dependent ?EYES: lids intact ?ENMT: oral mucous membranes moist ?CV: S1S2, RRR ?Pulmonary: LCTA, no increased work of breathing, no cough, room air ?Abdomen: intake 100%, normo-active BS + 4 quadrants, soft and non tender ?MSK: w/c  dependent ?Skin: warm and dry ?Neuro:  + generalized weakness,  no cognitive impairment ?Psych: non-anxious affect, A and O x 3 ?Thank you for the opportunity to participate in the care of Damon. Baba.  The palliative care team will continue to follow. Please call our office at [phone] if we can be of additional assistance.  ? ?Finn Altemose Z Degan Hanser, NP  ?  ?

## 2021-11-24 ENCOUNTER — Non-Acute Institutional Stay: Payer: Medicare Other | Admitting: Nurse Practitioner

## 2021-11-24 VITALS — BP 125/54 | HR 67 | Temp 98.0°F | Resp 18 | Wt 157.4 lb

## 2021-11-24 DIAGNOSIS — R5381 Other malaise: Secondary | ICD-10-CM

## 2021-11-24 DIAGNOSIS — Z720 Tobacco use: Secondary | ICD-10-CM

## 2021-11-24 DIAGNOSIS — Z515 Encounter for palliative care: Secondary | ICD-10-CM

## 2021-11-24 DIAGNOSIS — R0602 Shortness of breath: Secondary | ICD-10-CM

## 2021-11-25 ENCOUNTER — Encounter: Payer: Self-pay | Admitting: Nurse Practitioner

## 2021-11-25 NOTE — Progress Notes (Addendum)
[facility] Consult Note Telephone: [phone]  Fax: [phone]    Date of encounter: 11/25/21 10:19 AM PATIENT NAME: Damon [name] [address] [place] [address]   [phone] (home)  DOB: 11-18-1950 MRN: [id number] PRIMARY CARE PROVIDER:    [facility]  RESPONSIBLE PARTY:    Contact Information     Name Relation Home Work Mobile   Jadriel, Saxer [phone]     Wiles,Michael Brother [phone]         I met face to face with patient in facility. Palliative Care was asked to follow this patient by consultation request of  [place] to address advance care planning and complex medical decision making. This is a follow up visit.                                  ASSESSMENT AND PLAN / RECOMMENDATIONS:  Symptom Management/Plan: 1.ACP: DNR,  in Vynca/Epic; wishes are to continue to go to hospital if needed, treat what is treatable, yes to antiobiotic, IVF, diagnostic testing, lab testing   2. Palliative care encounter Palliative medicine team will continue to support patient, patient's family, and medical team. Visit consisted of counseling and education dealing with the complex and emotionally intense issues of symptom management and palliative care in the setting of serious and potentially life-threatening illness   3. Debility declined secondary to poor motivation; discussed at length with Damon [name] about using prosthetic, going to the gym, exercising, ambulating, self care and more self independence. Damon [name] endorses he has not been using prosthetic. We talked about what motivates him, barriers and challenges. We talked about residing at the facility and encouraged to more self independence activities for himself rather than relying on staff, his wife. Damon [name] endorses Damon. [name] continues to have staff, her perform activities that he should be doing himself. Support provided.    4.  Tobacco abuse, active smoking cessation discussed, educated, encouraged and he has decrease to 5 cigarettes a day monitored by attendant at the door as his allotment. Damon [name] and I talked about continuing to decrease time on the smoking patio and more time in the gym, trying to promote self independence. Damon [name] agreed. Damon. [name] and I talked about continuing to decrease amount allotted.    5.  Shortness of breath stable secondary to Ischemic cardiomyopathy, Chronic diastolic congestive heart failure. Continue lasix, carvedilol, compliance, weights, monitoring respiratory status, edema   Follow up Palliative Care Visit: Palliative care will continue to follow for complex medical decision making, advance care planning, and clarification of goals. Return 8 weeks or prn.   I spent 46 minutes providing this consultation starting at 10:30 am. More than 50% of the time in this consultation was spent in counseling and care coordination. PPS: 40%   Chief Complaint: Follow up palliative consult for complex medical decision making   HISTORY OF PRESENT ILLNESS:  Damon [name] is a 71 y.o. year old male  with medical problems including Ischemic cardiomyopathy, Chronic diastolic congestive heart failure, coronary artery disease, diabetes, chronic kidney disease, COPD, peripheral vascular disease, osteoarthritis, BPH, hyperlipidemia, anemia of chronic disease, Right BKA, tobacco abuse. Damon [name] continues to reside in [place] at [facility]. Damon [name]  does transferred to the wheelchair but requires assistance for adl's, bathing and dressing. Damon [name] does continue to wear  his prosthetic intermittently. Damon. [name]  is mobile throughout the facility and often does go and smoke. No falls, wounds, hospitalizations, infections. Staff endorses no other changes or concerns. I visited and observed Damon [name], is sitting in the w/c in his room. We talked about purpose of PC  visit, in agreement.. We talked about how he was feeling. Damon [name] endorses he is doing well. We talked about symptoms of pain, shortness of breath, ros  which he denies.  We talked about his appetite, foods he likes. We talked about his prosthetic which he has not been using, no trying to ambulate, or going to the gym. We talked about the importance of self motivation, self independence and trying to do things for himself rather than relying on others. We talked about using is prosthetic. We talked about importance of mobility, exercise. We talked about wellness which lead to smoking discussion, see above. We talked about quality of life. We talked about plan, medical goals reviewed. I called Ms. Liz, clinical update discussed with pc visit, ros, smoking cessation, debility, poor motivation, medical goals of care, support system, role pc in [place]. No new orders today. Will continue to follow. Therapeutic listening, emotional support provided. Questions answered. I updated staff.    History obtained from review of EMR, discussion with facility staff and Damon. [name]  I reviewed available labs, medications, imaging, studies and related documents from the EMR.  Records reviewed and summarized above.    ROS 10 point system reviewed with Damon. [name] all negative except HPI   Physical Exam: Constitutional: NAD General: pleasant male, w/c dependent EYES: lids intact ENMT: oral mucous membranes moist CV: S1S2, RRR Pulmonary: LCTA, no increased work of breathing, no cough, room air Abdomen: intake 100%, normo-active BS + 4 quadrants, soft and non tender MSK: w/c dependent Skin: warm and dry Neuro:  + generalized weakness,  no cognitive impairment Psych: non-anxious affect, A and O x 3  Thank you for the opportunity to participate in the care of Damon [name]  The palliative care team will continue to follow. Please call our office at [phone] if we can be of additional assistance.   Champion Corales Ihor Gully, NP

## 2022-01-29 ENCOUNTER — Encounter: Payer: Self-pay | Admitting: Nurse Practitioner

## 2022-01-29 ENCOUNTER — Non-Acute Institutional Stay: Payer: Medicare Other | Admitting: Nurse Practitioner

## 2022-01-29 VITALS — BP 148/64 | HR 68 | Temp 98.1°F | Resp 18 | Wt 154.6 lb

## 2022-01-29 DIAGNOSIS — Z515 Encounter for palliative care: Secondary | ICD-10-CM

## 2022-01-29 DIAGNOSIS — I509 Heart failure, unspecified: Secondary | ICD-10-CM

## 2022-01-29 DIAGNOSIS — R0602 Shortness of breath: Secondary | ICD-10-CM

## 2022-01-29 DIAGNOSIS — R5381 Other malaise: Secondary | ICD-10-CM

## 2022-01-29 NOTE — Progress Notes (Addendum)
  [facility] Consult Note Telephone: [phone]  Fax: [phone]    Date of encounter: 01/29/22 2:18 PM PATIENT NAME: Damon [name] [address] [place] [address]   [phone] (home)  DOB: 11/07/1950 MRN: [id number] PRIMARY CARE PROVIDER:    [facility]  RESPONSIBLE PARTY:    Contact Information     Name Relation Home Work Mobile   [name],Damon E Spouse [phone]     [name],Damon Brother [phone]              I met face to face with patient in facility. Palliative Care was asked to follow this patient by consultation request of  [facility] to address advance care planning and complex medical decision making. This is a follow up visit.                                  ASSESSMENT AND PLAN / RECOMMENDATIONS:  Symptom Management/Plan: 1.ACP: DNR,  in Vynca/Epic; wishes are to continue to go to hospital if needed, treat what is treatable, yes to antiobiotic, IVF, diagnostic testing, lab testing   2. Palliative care encounter Palliative medicine team will continue to support patient, patient's family, and medical team. Visit consisted of counseling and education dealing with the complex and emotionally intense issues of symptom management and palliative care in the setting of serious and potentially life-threatening illness   3. Debility declined secondary to poor motivation;    4. Shortness of breath stable secondary to Ischemic cardiomyopathy, Chronic diastolic congestive heart failure. Continue lasix, carvedilol, compliance, weights, monitoring respiratory status, edema   Follow up Palliative Care Visit: Palliative care will continue to follow for complex medical decision making, advance care planning, and clarification of goals. Return 8 weeks or prn.   I spent 47 minutes providing this consultation starting at 2:15 pm. More than 50% of the time in this consultation was spent in  counseling and care coordination. PPS: 40%   Chief Complaint: Follow up palliative consult for complex medical decision making   HISTORY OF PRESENT ILLNESS:  Damon [name] is a 71 y.o. year old male  with medical problems including Ischemic cardiomyopathy, Chronic diastolic congestive heart failure, coronary artery disease, diabetes, chronic kidney disease, COPD, peripheral vascular disease, osteoarthritis, BPH, hyperlipidemia, anemia of chronic disease, Right BKA, tobacco abuse. Mr. Damon [name] continues to reside in [facility] at [facility]. Mr. Damon [name]  does transferred to the wheelchair but requires assistance for adl's, bathing and dressing. Mr. Damon [name] does continue to wear his prosthetic intermittently. Mr. Damon [name]  is mobile throughout the facility and often does go and smoke. No falls, wounds, hospitalizations, infections. At present Mr. Damon [name] is sitting in the w/c in his room with his prosthetic on. Damon [name] is with him, they just returned from a drive, went to see her old home place. We talked about ros, how Mr. Damon [name] has been feeling. We talked about functional abilities have been improving. We talked about ros, appetite, increase in mobility, praised Mr. Damon [name] We talked about motivation has been improving. We talked about residing at facility. Damon [name] endorses he has been more active doing things for himself. Declines smoking cessation. We talked about medical goals, poc, no new changes. Updated staff.   Will continue to follow. Therapeutic listening, emotional support provided. Questions answered. I updated staff.    History obtained from review   of EMR, discussion with facility staff and Mr. Damon [name]  I reviewed available labs, medications, imaging, studies and related documents from the EMR.  Records reviewed and summarized above.    ROS 10 point system reviewed with Mr. Damon [name] all negative except HPI   Physical Exam: Constitutional: NAD General:  pleasant male, w/c dependent EYES: lids intact ENMT: oral mucous membranes moist CV: S1S2, RRR Pulmonary: LCTA, no increased work of breathing, no cough, room air Abdomen: intake 100%, normo-active BS + 4 quadrants, soft and non tender MSK: w/c dependent Skin: warm and dry Neuro:  + generalized weakness,  no cognitive impairment Psych: non-anxious affect, A and O x 3  Thank you for the opportunity to participate in the care of Mr. Damon [name]  The palliative care team will continue to follow. Please call our office at [phone] if we can be of additional assistance.    Z , NP   

## 2022-03-13 ENCOUNTER — Non-Acute Institutional Stay: Payer: Medicare Other | Admitting: Nurse Practitioner

## 2022-03-13 ENCOUNTER — Encounter: Payer: Self-pay | Admitting: Nurse Practitioner

## 2022-03-13 VITALS — BP 141/68 | HR 71 | Resp 18 | Wt 157.4 lb

## 2022-03-13 DIAGNOSIS — Z515 Encounter for palliative care: Secondary | ICD-10-CM

## 2022-03-13 DIAGNOSIS — R0602 Shortness of breath: Secondary | ICD-10-CM

## 2022-03-13 DIAGNOSIS — Z720 Tobacco use: Secondary | ICD-10-CM

## 2022-03-13 NOTE — Progress Notes (Unsigned)
[facility] Consult Note Telephone: [phone]  Fax: [phone]    Date of encounter: 03/13/22 3:52 PM PATIENT NAME: Damon [name] [address] [place] [address]   [phone] (home)  DOB: [birth year] MRN: [id number] PRIMARY CARE PROVIDER:    [facility]  RESPONSIBLE PARTY:    Contact Information     Name Relation Home Work Mobile   Damon [name], Damon [name] [phone]     Damon [name],Damon [name] Brother [phone]       I met face to face with patient in facility. Palliative Care was asked to follow this patient by consultation request of  Damon [name] to address advance care planning and complex medical decision making. This is a follow up visit.                                  ASSESSMENT AND PLAN / RECOMMENDATIONS:  Symptom Management/Plan: 1.ACP: DNR,  in Vynca/Epic; wishes are to continue to go to hospital if needed, treat what is treatable, yes to antiobiotic, IVF, diagnostic testing, lab testing   2. Palliative care encounter Palliative medicine team will continue to support patient, patient's family, and medical team. Visit consisted of counseling and education dealing with the complex and emotionally intense issues of symptom management and palliative care in the setting of serious and potentially life-threatening illness   3. Shortness of breath stable secondary to Ischemic cardiomyopathy, Chronic diastolic congestive heart failure. Continue lasix, carvedilol, compliance, weights, monitoring respiratory status, edema; encouraged smoking cessation   Follow up Palliative Care Visit: Palliative care will continue to follow for complex medical decision making, advance care planning, and clarification of goals. Return 8 weeks or prn.   I spent 37 minutes providing this consultation starting at 2:45 pm. More than 50% of the time in this consultation was spent in counseling and care coordination. PPS:  40%   Chief Complaint: Follow up palliative consult for complex medical decision making   HISTORY OF PRESENT ILLNESS:  Damon [name] is a 71 y.o. year old male  with medical problems including Ischemic cardiomyopathy, Chronic diastolic congestive heart failure, coronary artery disease, diabetes, chronic kidney disease, COPD, peripheral vascular disease, osteoarthritis, BPH, hyperlipidemia, anemia of chronic disease, Right BKA, tobacco abuse. Damon [name] continues to reside in [place] at [facility]. Damon [name]  does transferred to the wheelchair but requires assistance for adl's, bathing and dressing. Damon [name] does continue to wear his prosthetic intermittently. Damon [name]  is mobile throughout the facility and often does go and smoke. No falls, wounds, hospitalizations, infections. At present Damon [name] is sitting in the w/c in his room with his prosthetic on. No visitors present. We talked about how Damon [name] has been feeling. We talked about functional abilities have been improving. We talked about ros, appetite, increase in mobility, praised Damon [name] We talked about residing at facility. Mrs [name] endorses he has been more active doing things for himself. Declines smoking cessation. We talked about medical goals, poc, no new changes. Updated staff.    Will continue to follow. Therapeutic listening, emotional support provided. Questions answered. I updated staff.    History obtained from review of EMR, discussion with facility staff and Damon [name]  I reviewed available labs, medications, imaging, studies and related documents from the EMR.  Records reviewed and summarized above.    ROS 10 point system  reviewed with Damon [name] all negative except HPI   Physical Exam: Constitutional: NAD General: pleasant male, w/c dependent EYES: lids intact ENMT: oral mucous membranes moist CV: S1S2, RRR Pulmonary: LCTA, no increased work of breathing, no cough, room  air Abdomen: intake 100%, normo-active BS + 4 quadrants, soft and non tender MSK: w/c dependent Skin: warm and dry Neuro:  + generalized weakness,  no cognitive impairment Psych: non-anxious affect, A and O x 3 Thank you for the opportunity to participate in the care of Damon [name] Please call our office at [phone] if we can be of additional assistance.   Damon [name] Damon Gully, NP

## 2022-05-21 ENCOUNTER — Non-Acute Institutional Stay: Payer: Medicare Other | Admitting: Nurse Practitioner

## 2022-05-21 ENCOUNTER — Encounter: Payer: Self-pay | Admitting: Nurse Practitioner

## 2022-05-21 VITALS — BP 137/60 | HR 96 | Temp 98.1°F | Resp 18 | Wt 154.5 lb

## 2022-05-21 DIAGNOSIS — Z720 Tobacco use: Secondary | ICD-10-CM

## 2022-05-21 DIAGNOSIS — Z515 Encounter for palliative care: Secondary | ICD-10-CM

## 2022-05-21 DIAGNOSIS — R0602 Shortness of breath: Secondary | ICD-10-CM

## 2022-05-21 DIAGNOSIS — I509 Heart failure, unspecified: Secondary | ICD-10-CM

## 2022-05-21 NOTE — Progress Notes (Signed)
[facility] Consult Note Telephone: [phone]  Fax: [phone]    Date of encounter: 05/21/22 1:52 PM PATIENT NAME: ROURKE KASE [address] [place] [address]   [phone] (home)  DOB: [birth year] MRN: [id number] PRIMARY CARE PROVIDER:    [facility]  RESPONSIBLE PARTY:    Contact Information     Name Relation Home Work Mobile   Irfan, Kopplin [phone]     Fantroy,Michael Brother [phone]       I met face to face with patient in facility. Palliative Care was asked to follow this patient by consultation request of  [place] to address advance care planning and complex medical decision making. This is a follow up visit.                                  ASSESSMENT AND PLAN / RECOMMENDATIONS:  Symptom Management/Plan: 1.ACP: DNR,  in Vynca/Epic; wishes are to continue to go to hospital if needed, treat what is treatable, yes to antiobiotic, IVF, diagnostic testing, lab testing   2. Palliative care encounter Palliative medicine team will continue to support patient, patient's family, and medical team. Visit consisted of counseling and education dealing with the complex and emotionally intense issues of symptom management and palliative care in the setting of serious and potentially life-threatening illness   3. Shortness of breath stable secondary to Ischemic cardiomyopathy, Chronic diastolic congestive heart failure. Continue lasix, carvedilol, compliance, weights, monitoring respiratory status, edema; encouraged smoking cessation  05/18/2022 weight 154.5 lbs   Follow up Palliative Care Visit: Palliative care will continue to follow for complex medical decision making, advance care planning, and clarification of goals. Return 4 to 8 weeks or prn.   I spent 47 minutes providing this consultation starting at 1:00 pm. More than 50% of the time in this consultation was spent in  counseling and care coordination. PPS: 40%   Chief Complaint: Follow up palliative consult for complex medical decision making   HISTORY OF PRESENT ILLNESS:  AVANEESH GENRICH is a 72 y.o. year old male  with medical problems including Ischemic cardiomyopathy, Chronic diastolic congestive heart failure, coronary artery disease, diabetes, chronic kidney disease, COPD, peripheral vascular disease, osteoarthritis, BPH, hyperlipidemia, anemia of chronic disease, Right BKA, tobacco abuse. Mr. Christan continues to reside in [place] at [facility]. Mr. Wernette  does transferred to the wheelchair but requires assistance for adl's, bathing and dressing. Mr. Tsoukalas does continue to wear his prosthetic intermittently. Mr. Dockett  is mobile throughout the facility and often does go and smoke. No falls, wounds, hospitalizations, infections. Purpose of today PC f/u visit further discussion monitor trends of appetite, weights, monitor for functional, cognitive decline with chronic disease progression, assess any active symptoms, supportive role.At present Mr. Hinton is sitting in the w/c waiting to go to smoking patio. We talked about how he has been feeling, ros, functional abilities, smoking cessation. We talked about importance of self independence, fall risk. We talked about using prosthetic. We talked about weights, appetite, no recent falls, wounds, infections, hospitalizations. Medical goals reviewed goc, poc, medications. Supportive role. PC f/u visit further discussion monitor trends of appetite, weights, monitor for functional, cognitive decline with chronic disease progression, assess any active symptoms, supportive role.   Will continue to follow. Therapeutic listening, emotional support provided. Questions answered. I updated staff.  History obtained from review of EMR, discussion with facility staff and Mr. Mccart.  I reviewed available labs, medications, imaging,  studies and related documents from the EMR.  Records reviewed and summarized above.    Physical Exam: Constitutional: NAD General: pleasant male, w/c dependent ENMT: oral mucous membranes moist CV: S1S2, RRR Pulmonary: LCTA Abdomen: intake 100%, normo-active BS + 4 quadrants, soft and non tender MSK: w/c dependent Neuro:  + generalized weakness,  no cognitive impairment Psych: non-anxious affect, A and O x 3  Thank you for the opportunity to participate in the care of Mr. Smolik. Please call our office at [phone] if we can be of additional assistance.   Arzell Mcgeehan Ihor Gully, NP

## 2022-07-31 ENCOUNTER — Non-Acute Institutional Stay: Payer: Medicare Other | Admitting: Nurse Practitioner

## 2022-07-31 VITALS — BP 141/78 | HR 64 | Temp 97.1°F | Resp 18 | Wt 157.9 lb

## 2022-07-31 DIAGNOSIS — I509 Heart failure, unspecified: Secondary | ICD-10-CM

## 2022-07-31 DIAGNOSIS — R5381 Other malaise: Secondary | ICD-10-CM

## 2022-07-31 DIAGNOSIS — Z515 Encounter for palliative care: Secondary | ICD-10-CM

## 2022-07-31 DIAGNOSIS — R0602 Shortness of breath: Secondary | ICD-10-CM

## 2022-07-31 DIAGNOSIS — Z720 Tobacco use: Secondary | ICD-10-CM

## 2022-07-31 NOTE — Progress Notes (Signed)
[facility] Consult Note Telephone: [phone]  Fax: [phone]    Date of encounter: 07/31/22 9:44 PM PATIENT NAME: Damon [name] [address] [place] [address]   [phone] (home)  DOB: 05-20-1950 MRN: [id number] PRIMARY CARE PROVIDER:    [name]  RESPONSIBLE PARTY:    Contact Information     Name Relation Home Work Mobile   Damon [name], Damon [name] [phone]     Damon [name],Damon [name] Brother [phone]        I met face to face with patient in facility. Palliative Care was asked to follow this patient by consultation request of   Damon [name] to address advance care planning and complex medical decision making. This is a follow up visit.                                  ASSESSMENT AND PLAN / RECOMMENDATIONS:  Symptom Management/Plan: 1.ACP: DNR,  in Vynca/Epic; wishes are to continue to go to [name] if needed, treat what is treatable, yes to antiobiotic, IVF, diagnostic testing, lab testing   2. Palliative care encounter Palliative medicine team will continue to support patient, patient's family, and medical team. Visit consisted of counseling and education dealing with the complex and emotionally intense issues of symptom management and palliative care in the setting of serious and potentially life-threatening illness   3. Shortness of breath stable secondary to Ischemic cardiomyopathy, Chronic diastolic congestive heart failure. Stable, no recent exacerbation. Continue lasix, carvedilol, compliance, weights, monitoring respiratory status, edema; encouraged smoking cessation 05/18/2022 weight 154.5 lbs 07/12/2022 weight 157.9 lbs  4. Debility secondary to CM/CHF, w/c dependent. Weights/edema stable; continue to encourage oob, self independence, wear his prosthetic; declined smoking cessation.  Follow up Palliative Care Visit: Palliative care will continue to follow for complex medical decision  making, advance care planning, and clarification of goals. Return 4 to 8 weeks or prn.   I spent 45 minutes providing this consultation. More than 50% of the time in this consultation was spent in counseling and care coordination. PPS: 40%   Chief Complaint: Follow up palliative consult for complex medical decision making   HISTORY OF PRESENT ILLNESS:  Damon [name] is a 72 y.o. year old male  with medical problems including Ischemic cardiomyopathy, Chronic diastolic congestive heart failure, coronary artery disease, diabetes, chronic kidney disease, COPD, peripheral vascular disease, osteoarthritis, BPH, hyperlipidemia, anemia of chronic disease, Right BKA, tobacco abuse. Damon [name] continues to reside in [facility] at [name] Damon [name]  does transferred to the wheelchair but requires assistance for adl's, bathing and dressing. Damon [name] does continue to wear his prosthetic intermittently. Damon [name]  is mobile throughout the facility and often does go and smoke. No falls, wounds, hospitalizations, infections. Purpose of today PC f/u visit further discussion monitor trends of appetite, weights, monitor for functional, cognitive decline with chronic disease progression, assess any active symptoms, supportive role.At present Damon [name] is sitting in the w/c waiting in his room with his wife. We talked about how Mr [name] has been feeling, ros, functional abilities, daily routine with stressing self independence, going to dining area for meals, participating in activities, declined smoking cessation. We talked about appetite, Damon [name] concerns. Medical goals reviewed goc, poc, medications. Supportive role. PC f/u visit further discussion monitor trends of appetite, weights, monitor for functional, cognitive decline with chronic disease progression,  assess any active symptoms, supportive role.   History obtained from review of EMR, discussion with facility staff and  Damon [name]  I reviewed available labs, medications, imaging, studies and related documents from the EMR.  Records reviewed and summarized above.    Physical Exam: General: pleasant male, w/c dependent ENMT: oral mucous membranes moist CV: S1S2, RRR Pulmonary: breath sounds clear Neuro:  no generalized weakness,  no cognitive impairment Psych: non-anxious affect, A and O x 3 Thank you for the opportunity to participate in the care of Damon [name] Please call our office at [phone] if we can be of additional assistance.   Khadijatou Borak Prince [place], NP

## 2023-08-14 ENCOUNTER — Ambulatory Visit (INDEPENDENT_AMBULATORY_CARE_PROVIDER_SITE_OTHER): Admitting: Urology

## 2023-08-14 ENCOUNTER — Encounter: Payer: Self-pay | Admitting: Urology

## 2023-08-14 VITALS — BP 154/63 | HR 65 | Ht 70.0 in | Wt 157.0 lb

## 2023-08-14 DIAGNOSIS — N390 Urinary tract infection, site not specified: Secondary | ICD-10-CM | POA: Diagnosis not present

## 2023-08-14 DIAGNOSIS — R310 Gross hematuria: Secondary | ICD-10-CM | POA: Diagnosis not present

## 2023-08-14 LAB — URINALYSIS, COMPLETE
Bilirubin, UA: NEGATIVE
Glucose, UA: NEGATIVE
Ketones, UA: NEGATIVE
Nitrite, UA: NEGATIVE
Specific Gravity, UA: 1.02 (ref 1.005–1.030)
Urobilinogen, Ur: 0.2 mg/dL (ref 0.2–1.0)
pH, UA: 5.5 (ref 5.0–7.5)

## 2023-08-14 LAB — MICROSCOPIC EXAMINATION: RBC, Urine: >30 /HPF — AB (ref 0–2)

## 2023-08-14 NOTE — Progress Notes (Signed)
 I, Damon [name], acting as a scribe for Damon Knapp, MD., have documented all relevant documentation on the behalf of Damon Knapp, MD, as directed by Damon Knapp, MD while in the presence of Damon Knapp, MD.  08/14/2023 3:07 PM   [name] [birth year] [id number]  Referring provider: Ozell Blunt, MD [address] [place],  [place] [address]  Chief Complaint  Patient presents with   Recurrent UTI    HPI: Damon [name] is a 73 y.o. male who is a resident of [facility] and has a history of ischemic cardiomyopathy, chronic diastolic CHF referred for recurrent UTI. His wife is present with him today.   He states he has been diagnosed with urinary tract infections over the last several months, but denies any symptoms including dysuria, frequency, urgency, or urinary hesitancy. No labs were forwarded for review.  He does have intermittent gross hematuria. Denies previous history of urologic problems.   PMH: Past Medical History:  Diagnosis Date   CAD (coronary artery disease)    a. 01/2016 MI/PCI: LM nl, LAD 116m/d (3.25 x 28 Xience DES), 40d, LCX 30ost, OM1 95 (staged - 2.75 x 18 Xience Alpine DES), OM2/3 min irregs, RCA 12m.   Chronic combined systolic and diastolic CHF (congestive heart failure) (HCC)    a. 01/2016 Echo: Ef 25-30%, Gr1 DD, mild AI, mildly dil LA;  b. 02/2016 Echo: EF 30-35%, apical AK, antsept and ant HK, nl RV fxn.   CKD (chronic kidney disease), stage III (HCC)    COPD (chronic obstructive pulmonary disease) (HCC)    Diabetes mellitus without complication (HCC)    Diverticulitis    Essential hypertension    Ischemic cardiomyopathy    a. 01/2016 Echo: Ef 25-30%;  b. 02/2016 Echo: EF 30-35%.   LBBB (left bundle branch block)    PAD (peripheral artery disease) (HCC)    a. 10/2015 s/p PTA and stenting of the RCIA, LCIA, and PTA of the LEIA (Schnier); b. 04/2016 PTA or R tibioperoneal trunk & prox PT, PTA/DBA of  R Pop and SFA, Viabahn covered stent x 2 to the R SFA and pop (5mm x 25 cm & 5mm x 15cm).   Tobacco abuse     Surgical History: Past Surgical History:  Procedure Laterality Date   AMPUTATION Right 06/06/2016   Procedure: AMPUTATION BELOW KNEE;  Surgeon: Celso College, MD;  Location: [facility] ORS;  Service: General;  Laterality: Right;   CARDIAC CATHETERIZATION N/A 01/13/2016   Procedure: LEFT HEART CATH AND CORONARY ANGIOGRAPHY;  Surgeon: Wenona Hamilton, MD;  Location: [facility] INVASIVE CV LAB;  Service: Cardiovascular;  Laterality: N/A;   CARDIAC CATHETERIZATION N/A 01/13/2016   Procedure: Coronary Stent Intervention;  Surgeon: Wenona Hamilton, MD;  Location: [facility] INVASIVE CV LAB;  Service: Cardiovascular;  Laterality: N/A;   CARDIAC CATHETERIZATION N/A 01/16/2016   Procedure: Left Heart Cath and Coronary Angiography;  Surgeon: Wenona Hamilton, MD;  Location: [facility] INVASIVE CV LAB;  Service: Cardiovascular;  Laterality: N/A;   INCISION AND DRAINAGE ABSCESS N/A 07/12/2016   Procedure: INCISION AND DRAINAGE GLUTEAL  ABSCESS;  Surgeon: Alben Alma, MD;  Location: [facility] ORS;  Service: General;  Laterality: N/A;   IRRIGATION AND DEBRIDEMENT FOOT Right 05/08/2016   Procedure: IRRIGATION AND DEBRIDEMENT FOOT WITH PLACEMENT OF ANTIBIOTIC BEADS;  Surgeon: Angel Barba, DPM;  Location: [facility] OR[facility];  Service: Podiatry;  Laterality: Right;   LOWER EXTREMITY ANGIOGRAPHY Left  01/29/2017   Procedure: Lower Extremity Angiography;  Surgeon: Jackquelyn Mass, MD;  Location: [facility] INVASIVE CV LAB;  Service: Cardiovascular;  Laterality: Left;   NECK SURGERY     PERIPHERAL VASCULAR CATHETERIZATION Left 11/01/2015   Procedure: Lower Extremity Angiography;  Surgeon: Jackquelyn Mass, MD;  Location: [facility] INVASIVE CV LAB;  Service: Cardiovascular;  Laterality: Left;   PERIPHERAL VASCULAR CATHETERIZATION Right 05/07/2016   Procedure: Lower Extremity Angiography;  Surgeon: Celso College, MD;  Location: [facility] INVASIVE CV LAB;  Service:  Cardiovascular;  Laterality: Right;    Home Medications:  Allergies as of 08/14/2023   No Known Allergies      Medication List        Accurate as of Aug 14, 2023  3:07 PM. If you have any questions, ask your nurse or doctor.          Align 4 MG Caps Take 1 capsule by mouth daily.   amLODipine 10 MG tablet Commonly known as: NORVASC Take 10 mg by mouth daily.   atorvastatin  80 MG tablet Commonly known as: LIPITOR Take 1 tablet (80 mg total) by mouth daily at 6 PM.   carvedilol  12.5 MG tablet Commonly known as: COREG  Take 1 tablet (12.5 mg total) by mouth 2 (two) times daily with a meal.   clopidogrel  75 MG tablet Commonly known as: PLAVIX  Take 1 tablet (75 mg total) by mouth daily.   Combivent Respimat 20-100 MCG/ACT Aers respimat Generic drug: Ipratropium-Albuterol  Inhale 1 puff into the lungs every 4 (four) hours as needed for wheezing.   feeding supplement (PRO-STAT SUGAR FREE 64) Liqd Take 30 mLs by mouth 3 (three) times daily.   ferrous sulfate  325 (65 FE) MG tablet Take 1 tablet (325 mg total) by mouth 3 (three) times daily with meals.   fluticasone 50 MCG/ACT nasal spray Commonly known as: FLONASE Place 2 sprays into both nostrils daily.   furosemide  40 MG tablet Commonly known as: LASIX  Take 1 tablet (40 mg total) by mouth daily.   hydrALAZINE  25 MG tablet Commonly known as: APRESOLINE  Take 1 tablet (25 mg total) by mouth every 8 (eight) hours.   HYDROcodone -acetaminophen  5-325 MG tablet Commonly known as: NORCO/VICODIN Take 1 tablet by mouth every 6 (six) hours as needed for moderate pain.   insulin  aspart 100 UNIT/ML injection Commonly known as: novoLOG  Inject 6 Units into the skin 2 (two) times daily with breakfast and lunch. If reading is between 70-100 give after eating. Hold if reading is less than 70 or if resident will not eat   Lantus  SoloStar 100 UNIT/ML Solostar Pen Generic drug: insulin  glargine Inject 18 Units into the skin at  bedtime.   melatonin 5 MG Tabs Take 5 mg by mouth at bedtime.   multivitamin with minerals Tabs tablet Take 1 tablet by mouth daily.   omeprazole 20 MG capsule Commonly known as: PRILOSEC Take 20 mg by mouth daily.   polyethylene glycol 17 g packet Commonly known as: MIRALAX  / GLYCOLAX  Take 17 g by mouth daily.   senna 8.6 MG tablet Commonly known as: SENOKOT Take 1 tablet by mouth daily.   tamsulosin  0.4 MG Caps capsule Commonly known as: FLOMAX  Take 1 capsule (0.4 mg total) by mouth daily.        Allergies: No Known Allergies  Family History: Family History  Problem Relation Age of Onset   Intracerebral hemorrhage Mother    Diabetes Mother    Cancer Mother    Diabetes Father  Social History:  reports that he has been smoking cigarettes. He has a 15 pack-year smoking history. He uses smokeless tobacco. He reports that he does not drink alcohol and does not use drugs.   Physical Exam: BP (!) 154/63   Pulse 65   Ht 5\' 10"  (1.778 m)   Wt 157 lb (71.2 kg)   BMI 22.53 kg/m   Constitutional:  Alert and oriented, No acute distress. HEENT: [place] AT Respiratory: Normal respiratory effort, no increased work of breathing. Psychiatric: Normal mood and affect.  Urinalysis Dipstick grossly bloody, microscopy 11-30 WBC/>30 RBC   Assessment & Plan:    1. Gross hematuria Requested labs including recent creatinine. Would recommend further evaluation with CT urogram and cystoscopy, if creatinine normal [facility] was contacted and requested copies of recent labs and urine culture reports be faxed to our office for review.  I have reviewed the above documentation for accuracy and completeness, and I agree with the above.   Damon Knapp, MD  [facility] [address] [place], [place] [address] [phone]

## 2023-08-22 LAB — CULTURE, URINE COMPREHENSIVE

## 2023-08-26 ENCOUNTER — Ambulatory Visit: Payer: Self-pay | Admitting: Urology

## 2023-08-26 ENCOUNTER — Other Ambulatory Visit: Payer: Self-pay | Admitting: *Deleted

## 2023-08-26 DIAGNOSIS — R310 Gross hematuria: Secondary | ICD-10-CM

## 2023-08-26 MED ORDER — SULFAMETHOXAZOLE-TRIMETHOPRIM 800-160 MG PO TABS
1.0000 | ORAL_TABLET | Freq: Two times a day (BID) | ORAL | 0 refills | Status: DC
Start: 2023-08-26 — End: 2023-10-21

## 2023-09-02 ENCOUNTER — Telehealth: Payer: Self-pay | Admitting: Urology

## 2023-09-02 NOTE — Telephone Encounter (Signed)
 Please order CT hematuria study and schedule cystoscopy with CT follow-up appointment.  Will also need a serum creatinine.  Resident [facility]

## 2023-09-03 ENCOUNTER — Other Ambulatory Visit: Payer: Self-pay | Admitting: *Deleted

## 2023-09-03 DIAGNOSIS — R310 Gross hematuria: Secondary | ICD-10-CM

## 2023-09-09 NOTE — Telephone Encounter (Signed)
 Left a message on the voice mail for appts. To call and get patient scheduled for ct scan and than call office to schedule cysto with us 

## 2023-09-13 ENCOUNTER — Other Ambulatory Visit: Payer: Self-pay | Admitting: Urology

## 2023-09-13 ENCOUNTER — Ambulatory Visit
Admission: RE | Admit: 2023-09-13 | Discharge: 2023-09-13 | Disposition: A | Discharge status: Home / Self Care | Source: Ambulatory Visit | Attending: Urology | Admitting: Urology

## 2023-09-13 DIAGNOSIS — R310 Gross hematuria: Secondary | ICD-10-CM | POA: Insufficient documentation

## 2023-09-13 LAB — POCT I-STAT CREATININE: Creatinine, Ser: 2.3 mg/dL — ABNORMAL HIGH (ref 0.61–1.24)

## 2023-09-13 MED ORDER — IOHEXOL 300 MG/ML  SOLN: 100.0000 mL | Freq: Once | INTRAMUSCULAR | Status: DC | PRN

## 2023-09-15 ENCOUNTER — Ambulatory Visit: Payer: Self-pay | Admitting: Urology

## 2023-10-16 ENCOUNTER — Ambulatory Visit (INDEPENDENT_AMBULATORY_CARE_PROVIDER_SITE_OTHER): Admitting: Urology

## 2023-10-16 VITALS — BP 123/60 | HR 97 | Ht 68.0 in | Wt 157.0 lb

## 2023-10-16 DIAGNOSIS — R310 Gross hematuria: Secondary | ICD-10-CM

## 2023-10-16 LAB — URINALYSIS, COMPLETE
Bilirubin, UA: NEGATIVE
Glucose, UA: NEGATIVE
Nitrite, UA: POSITIVE — AB
Specific Gravity, UA: 1.02 (ref 1.005–1.030)
Urobilinogen, Ur: 0.2 mg/dL (ref 0.2–1.0)
pH, UA: 6 (ref 5.0–7.5)

## 2023-10-16 LAB — MICROSCOPIC EXAMINATION: RBC, Urine: >30 /HPF — AB (ref 0–2)

## 2023-10-16 NOTE — Progress Notes (Signed)
 Scheduled for cystoscopy today however urinalysis nitrite positive with pyuria.  Urine culture ordered and will reschedule cystoscopy while he is on antibiotic therapy

## 2023-10-16 NOTE — Progress Notes (Signed)
 In and Out Catheterization  Patient is present today for a I & O catheterization due to can't urine. Patient was cleaned and prepped in a sterile fashion with betadine . A 16FR cath was inserted no complications were noted , 30ml of urine return was noted, urine was light red urine in color. A clean urine sample was collected for ua sample. Bladder was drained  And catheter was removed with out difficulty.    Performed by: Harlene Franks CMA

## 2023-10-18 LAB — CULTURE, URINE COMPREHENSIVE

## 2023-10-21 ENCOUNTER — Ambulatory Visit: Payer: Self-pay | Admitting: Urology

## 2023-10-21 ENCOUNTER — Other Ambulatory Visit: Payer: Self-pay | Admitting: *Deleted

## 2023-10-21 MED ORDER — DOXYCYCLINE HYCLATE 100 MG PO TABS: 100.0000 mg | ORAL_TABLET | Freq: Two times a day (BID) | ORAL | 0 refills | Status: DC

## 2023-10-24 MED ORDER — DOXYCYCLINE HYCLATE 100 MG PO TABS: 100.0000 mg | ORAL_TABLET | Freq: Two times a day (BID) | ORAL | 0 refills | Status: AC

## 2023-10-28 ENCOUNTER — Other Ambulatory Visit: Admitting: Urology

## 2023-11-20 ENCOUNTER — Other Ambulatory Visit: Admitting: Urology

## 2023-12-24 ENCOUNTER — Other Ambulatory Visit: Admitting: Urology

## 2024-01-02 ENCOUNTER — Telehealth: Payer: Self-pay

## 2024-01-02 NOTE — Telephone Encounter (Signed)
 Spoke with facility after returning their call. Was not able to speak with his nurse directly. Aid stated he had some clots recently in his brief while urinating but was still urinating fine. No pain was noted from patient. He has missed a few visit to complete a cysto to see where his bleeding is coming from and I encouraged them that he needed to be at his next visit.

## 2024-01-29 ENCOUNTER — Ambulatory Visit (INDEPENDENT_AMBULATORY_CARE_PROVIDER_SITE_OTHER): Admitting: Urology

## 2024-01-29 VITALS — BP 135/70 | HR 62 | Ht 68.0 in | Wt 135.0 lb

## 2024-01-29 DIAGNOSIS — R310 Gross hematuria: Secondary | ICD-10-CM | POA: Diagnosis not present

## 2024-01-29 LAB — URINALYSIS, COMPLETE
Bilirubin, UA: NEGATIVE
Glucose, UA: NEGATIVE
Ketones, UA: NEGATIVE
Nitrite, UA: POSITIVE — AB
Specific Gravity, UA: 1.015 (ref 1.005–1.030)
Urobilinogen, Ur: 0.2 mg/dL (ref 0.2–1.0)
pH, UA: 6 (ref 5.0–7.5)

## 2024-01-29 LAB — MICROSCOPIC EXAMINATION: WBC, UA: >30 /HPF — AB (ref 0–5)

## 2024-01-29 NOTE — Progress Notes (Signed)
 In and Out Catheterization  Patient is present today for a I & O catheterization due to Cysto. Patient was cleaned and prepped in a sterile fashion with betadine . A 14FR cath was inserted no complications were noted , 40ml of urine return was noted, urine was yellow in color. A clean urine sample was collected for UA. Bladder was drained  And catheter was removed with out difficulty.    Performed by: Laymon Ned, CMA  Follow up/ Additional notes: no notes

## 2024-01-29 NOTE — Progress Notes (Signed)
 History of recurrent UTI and intermittent gross hematuria.  Cystoscopy has been scheduled however has had significant pyuria and positive urine cultures.  He has no-showed on 3 appointments for cystoscopies in July, August and September.  Noted at facility to have recent episode of gross hematuria.  His cystoscopy was rescheduled today.  He was unable to void and catheterized urine was nitrite positive with 3+ leukocytes and 1+ blood.  Microscopy >30 WBC and 11-30 RBC  Urine culture was ordered.  Will start on antibiotics once culture returns and schedule cystoscopy while he is on antibiotic therapy

## 2024-02-06 LAB — CULTURE, URINE COMPREHENSIVE

## 2024-02-09 ENCOUNTER — Ambulatory Visit: Payer: Self-pay | Admitting: Urology

## 2024-02-10 ENCOUNTER — Other Ambulatory Visit: Payer: Self-pay | Admitting: *Deleted

## 2024-02-10 MED ORDER — CIPROFLOXACIN HCL 250 MG PO TABS: 250.0000 mg | ORAL_TABLET | Freq: Two times a day (BID) | ORAL | 0 refills | Status: AC

## 2024-02-10 MED ORDER — CIPROFLOXACIN HCL 250 MG PO TABS: 250.0000 mg | ORAL_TABLET | Freq: Two times a day (BID) | ORAL | 0 refills | Status: DC

## 2024-02-10 NOTE — Progress Notes (Signed)
 Could not leave voice mail , Sent in medication

## 2024-02-10 NOTE — Addendum Note (Signed)
 Addended by: GENITA HARLENE CROME on: 02/10/2024 02:54 PM   Modules accepted: Orders

## 2024-02-10 NOTE — Telephone Encounter (Signed)
 Faxed rx to [phone]. For [facility] to refill his medication .

## 2024-03-25 ENCOUNTER — Emergency Department
Admission: EM | Admit: 2024-03-25 | Discharge: 2024-03-25 | Disposition: A | Discharge status: Home / Self Care | Source: Home / Self Care | Attending: Emergency Medicine | Admitting: Emergency Medicine

## 2024-03-25 ENCOUNTER — Emergency Department

## 2024-03-25 DIAGNOSIS — S0081XA Abrasion of other part of head, initial encounter: Secondary | ICD-10-CM | POA: Insufficient documentation

## 2024-03-25 DIAGNOSIS — Z7902 Long term (current) use of antithrombotics/antiplatelets: Secondary | ICD-10-CM | POA: Insufficient documentation

## 2024-03-25 DIAGNOSIS — E1122 Type 2 diabetes mellitus with diabetic chronic kidney disease: Secondary | ICD-10-CM | POA: Insufficient documentation

## 2024-03-25 DIAGNOSIS — J449 Chronic obstructive pulmonary disease, unspecified: Secondary | ICD-10-CM | POA: Diagnosis not present

## 2024-03-25 DIAGNOSIS — S0993XA Unspecified injury of face, initial encounter: Secondary | ICD-10-CM | POA: Diagnosis present

## 2024-03-25 DIAGNOSIS — N189 Chronic kidney disease, unspecified: Secondary | ICD-10-CM | POA: Insufficient documentation

## 2024-03-25 DIAGNOSIS — Z981 Arthrodesis status: Secondary | ICD-10-CM | POA: Insufficient documentation

## 2024-03-25 DIAGNOSIS — Z7901 Long term (current) use of anticoagulants: Secondary | ICD-10-CM | POA: Insufficient documentation

## 2024-03-25 DIAGNOSIS — I2510 Atherosclerotic heart disease of native coronary artery without angina pectoris: Secondary | ICD-10-CM | POA: Insufficient documentation

## 2024-03-25 DIAGNOSIS — W19XXXA Unspecified fall, initial encounter: Secondary | ICD-10-CM

## 2024-03-25 DIAGNOSIS — S0990XA Unspecified injury of head, initial encounter: Secondary | ICD-10-CM | POA: Diagnosis present

## 2024-03-25 DIAGNOSIS — T148XXA Other injury of unspecified body region, initial encounter: Secondary | ICD-10-CM

## 2024-03-25 NOTE — ED Triage Notes (Signed)
 Refer to first nurse note. Pt fell out of wheelchair hitting his head. Pt does take blood thinners. Pt denies pain. A&Ox2.

## 2024-03-25 NOTE — ED Notes (Signed)
 [facility] Called spoke with [place] regarding transfer back to [facility]

## 2024-03-25 NOTE — ED Provider Notes (Signed)
 [facility] Provider Note    Event Date/Time   First MD Initiated Contact with Patient 03/25/24 1649     (approximate)   History   Fall   HPI  Damon [name] is a 73 y.o. male with history of CAD, CKD, COPD, diabetes, on Plavix , presenting with a fall.  Patient fell out of wheelchair, hit his head.  No LOC.  Denies pain anywhere else.  Denies facial pain, states that he was not having any preceding symptoms prior to the fall.    On independent chart review, his last echo was done in 2018, showed EF of 55 to 60%, grade 1 diastolic dysfunction.     Physical Exam   Triage Vital Signs: ED Triage Vitals  Encounter Vitals Group     BP 03/25/24 1435 (!) 131/55     Girls Systolic BP Percentile --      Girls Diastolic BP Percentile --      Boys Systolic BP Percentile --      Boys Diastolic BP Percentile --      Pulse Rate 03/25/24 1435 61     Resp 03/25/24 1435 18     Temp 03/25/24 1435 98 F (36.7 C)     Temp Source 03/25/24 1435 Oral     SpO2 03/25/24 1435 100 %     Weight 03/25/24 1436 134 lb 7.7 oz (61 kg)     Height 03/25/24 1436 5' 8 (1.727 m)     Head Circumference --      Peak Flow --      Pain Score 03/25/24 1436 0     Pain Loc --      Pain Education --      Exclude from Growth Chart --     Most recent vital signs: Vitals:   03/25/24 1435  BP: (!) 131/55  Pulse: 61  Resp: 18  Temp: 98 F (36.7 C)  SpO2: 100%     General: Awake, no distress.  CV:  Good peripheral perfusion.  Resp:  Normal effort.  No thoracic cage tenderness Abd:  No distention.  Soft nontender Other:  He has abrasion to the top of his forehead with slight swelling.  No other palpable skull deformities or tenderness, no midline spinal tenderness, he is able to fully range his extremities without focal weakness or numbness, no bony tenderness.  He status post right BKA.  He has no facial tenderness.     ED Results / Procedures / Treatments   Labs (all labs  ordered are listed, but only abnormal results are displayed) Labs Reviewed - No data to display    RADIOLOGY On my independent interpretation, CT head without obvious intracranial hemorrhage   PROCEDURES:  Critical Care performed: No  Procedures   MEDICATIONS ORDERED IN ED: Medications - No data to display   IMPRESSION / MDM / ASSESSMENT AND PLAN / ED COURSE  I reviewed the triage vital signs and the nursing notes.                              Differential diagnosis includes, but is not limited to, contusion, strain, sprain, fracture, intracranial hemorrhage.  CT head, CT cervical spine.  Patient's presentation is most consistent with acute presentation with potential threat to life or bodily function.  Independent interpretation of imaging below.  He has no acute traumatic injuries, he has no tenderness to his face or his  paranasal sinuses.  Denies any congestion or rhinorrhea.  Discussed with him all imaging.  Considered but no indication for inpatient admission at this time, he safe for outpatient management.  Will discharge with strict return precautions.    Clinical Course as of 03/25/24 1656  Wed Mar 25, 2024  1649 CT Head Wo Contrast IMPRESSION: 1. No acute brain injury. 2. Mild age related atrophy and chronic ischemic microvascular disease. 3. No acute cervical spine injury. 4. Anterior fusion hardware intact with interbody fusion from C4-C7. Moderate spondylosis of the cervical spine with multilevel disc disease and neural foraminal narrowing as described. 5. Opacification and air-fluid levels over the paranasal sinuses as described which could be seen due to acute sinusitis also due to trauma in the setting of facial bone fractures. Maxillofacial bone CT may be helpful if concern for facial bone fracture.   [TT]    Clinical Course User Index [TT] Waymond Lorelle Cummins, MD     FINAL CLINICAL IMPRESSION(S) / ED DIAGNOSES   Final diagnoses:  Fall, initial  encounter  Abrasion     Rx / DC Orders   ED Discharge Orders     None        Note:  This document was prepared using Dragon voice recognition software and may include unintentional dictation errors.    Waymond Lorelle Cummins, MD 03/25/24 [id number]

## 2024-03-25 NOTE — ED Triage Notes (Signed)
 First nurse note: pt to ED [facility] from [facility], fall while leaning out of w/c. +hit head. Denies LOC. +blood thinners.
